# Patient Record
Sex: Female | Born: 1960 | Race: Black or African American | Hispanic: No | Marital: Married | Smoking: Never smoker
Health system: Southern US, Community
[De-identification: ages and names within clinical notes are randomized; demographics above are authoritative.]

## PROBLEM LIST (undated history)

## (undated) ENCOUNTER — Ambulatory Visit: Admission: EM | Payer: Self-pay | Source: Home / Self Care

## (undated) DIAGNOSIS — I10 Essential (primary) hypertension: Secondary | ICD-10-CM

## (undated) DIAGNOSIS — R202 Paresthesia of skin: Secondary | ICD-10-CM

## (undated) DIAGNOSIS — R200 Anesthesia of skin: Secondary | ICD-10-CM

## (undated) DIAGNOSIS — R7303 Prediabetes: Secondary | ICD-10-CM

## (undated) DIAGNOSIS — E785 Hyperlipidemia, unspecified: Secondary | ICD-10-CM

## (undated) DIAGNOSIS — G4730 Sleep apnea, unspecified: Secondary | ICD-10-CM

## (undated) DIAGNOSIS — M1990 Unspecified osteoarthritis, unspecified site: Secondary | ICD-10-CM

## (undated) DIAGNOSIS — K219 Gastro-esophageal reflux disease without esophagitis: Secondary | ICD-10-CM

## (undated) DIAGNOSIS — D259 Leiomyoma of uterus, unspecified: Secondary | ICD-10-CM

## (undated) DIAGNOSIS — D649 Anemia, unspecified: Secondary | ICD-10-CM

## (undated) DIAGNOSIS — Z90710 Acquired absence of both cervix and uterus: Secondary | ICD-10-CM

## (undated) DIAGNOSIS — R011 Cardiac murmur, unspecified: Secondary | ICD-10-CM

## (undated) HISTORY — PX: TUBAL LIGATION: SHX77

## (undated) HISTORY — PX: CHOLECYSTECTOMY: SHX55

## (undated) HISTORY — PX: BACK SURGERY: SHX140

## (undated) HISTORY — DX: Gastro-esophageal reflux disease without esophagitis: K21.9

## (undated) HISTORY — DX: Acquired absence of both cervix and uterus: Z90.710

## (undated) HISTORY — DX: Hyperlipidemia, unspecified: E78.5

## (undated) HISTORY — DX: Leiomyoma of uterus, unspecified: D25.9

## (undated) HISTORY — DX: Anemia, unspecified: D64.9

---

## 2001-06-02 ENCOUNTER — Other Ambulatory Visit: Admission: RE | Admit: 2001-06-02 | Discharge: 2001-06-02 | Payer: Self-pay | Admitting: Family Medicine

## 2001-06-10 HISTORY — PX: COLONOSCOPY: SHX174

## 2002-01-01 ENCOUNTER — Ambulatory Visit (HOSPITAL_COMMUNITY): Admission: RE | Admit: 2002-01-01 | Discharge: 2002-01-01 | Payer: Self-pay | Admitting: Internal Medicine

## 2002-06-18 ENCOUNTER — Ambulatory Visit (HOSPITAL_COMMUNITY): Admission: RE | Admit: 2002-06-18 | Discharge: 2002-06-18 | Payer: Self-pay | Admitting: Pediatrics

## 2002-06-18 ENCOUNTER — Encounter: Payer: Self-pay | Admitting: Family Medicine

## 2003-03-11 ENCOUNTER — Encounter: Payer: Self-pay | Admitting: Family Medicine

## 2003-03-11 ENCOUNTER — Ambulatory Visit (HOSPITAL_COMMUNITY): Admission: RE | Admit: 2003-03-11 | Discharge: 2003-03-11 | Payer: Self-pay | Admitting: Family Medicine

## 2003-03-18 ENCOUNTER — Encounter: Payer: Self-pay | Admitting: Family Medicine

## 2003-03-18 ENCOUNTER — Ambulatory Visit (HOSPITAL_COMMUNITY): Admission: RE | Admit: 2003-03-18 | Discharge: 2003-03-18 | Payer: Self-pay | Admitting: Family Medicine

## 2003-03-29 ENCOUNTER — Observation Stay (HOSPITAL_COMMUNITY): Admission: RE | Admit: 2003-03-29 | Discharge: 2003-03-30 | Payer: Self-pay | Admitting: General Surgery

## 2003-10-21 ENCOUNTER — Ambulatory Visit (HOSPITAL_COMMUNITY): Admission: RE | Admit: 2003-10-21 | Discharge: 2003-10-21 | Payer: Self-pay | Admitting: Family Medicine

## 2004-07-13 ENCOUNTER — Ambulatory Visit: Payer: Self-pay | Admitting: Family Medicine

## 2004-07-18 ENCOUNTER — Ambulatory Visit (HOSPITAL_COMMUNITY): Admission: RE | Admit: 2004-07-18 | Discharge: 2004-07-18 | Payer: Self-pay | Admitting: Family Medicine

## 2004-09-24 ENCOUNTER — Ambulatory Visit: Payer: Self-pay | Admitting: Family Medicine

## 2004-11-02 ENCOUNTER — Ambulatory Visit (HOSPITAL_COMMUNITY): Admission: RE | Admit: 2004-11-02 | Discharge: 2004-11-02 | Payer: Self-pay | Admitting: Family Medicine

## 2004-12-03 ENCOUNTER — Ambulatory Visit: Payer: Self-pay | Admitting: Family Medicine

## 2005-02-22 ENCOUNTER — Ambulatory Visit (HOSPITAL_COMMUNITY): Admission: RE | Admit: 2005-02-22 | Discharge: 2005-02-22 | Payer: Self-pay | Admitting: Preventative Medicine

## 2005-05-21 ENCOUNTER — Ambulatory Visit: Payer: Self-pay | Admitting: Family Medicine

## 2005-06-05 ENCOUNTER — Ambulatory Visit: Payer: Self-pay | Admitting: Family Medicine

## 2005-06-19 ENCOUNTER — Ambulatory Visit (HOSPITAL_COMMUNITY): Admission: RE | Admit: 2005-06-19 | Discharge: 2005-06-19 | Payer: Self-pay | Admitting: Family Medicine

## 2005-07-10 ENCOUNTER — Ambulatory Visit (HOSPITAL_COMMUNITY): Admission: RE | Admit: 2005-07-10 | Discharge: 2005-07-10 | Payer: Self-pay | Admitting: Family Medicine

## 2005-07-11 ENCOUNTER — Emergency Department (HOSPITAL_COMMUNITY): Admission: EM | Admit: 2005-07-11 | Discharge: 2005-07-11 | Payer: Self-pay | Admitting: Emergency Medicine

## 2005-08-15 ENCOUNTER — Ambulatory Visit: Payer: Self-pay | Admitting: Family Medicine

## 2005-08-21 ENCOUNTER — Ambulatory Visit (HOSPITAL_COMMUNITY): Admission: RE | Admit: 2005-08-21 | Discharge: 2005-08-21 | Payer: Self-pay | Admitting: Family Medicine

## 2005-11-21 ENCOUNTER — Ambulatory Visit: Payer: Self-pay | Admitting: Family Medicine

## 2006-03-14 ENCOUNTER — Ambulatory Visit (HOSPITAL_COMMUNITY): Admission: RE | Admit: 2006-03-14 | Discharge: 2006-03-14 | Payer: Self-pay | Admitting: Family Medicine

## 2006-04-03 ENCOUNTER — Ambulatory Visit: Payer: Self-pay | Admitting: Family Medicine

## 2006-04-16 ENCOUNTER — Ambulatory Visit: Payer: Self-pay | Admitting: Family Medicine

## 2006-08-28 ENCOUNTER — Ambulatory Visit: Payer: Self-pay | Admitting: Family Medicine

## 2006-08-28 ENCOUNTER — Other Ambulatory Visit: Admission: RE | Admit: 2006-08-28 | Discharge: 2006-08-28 | Payer: Self-pay | Admitting: Family Medicine

## 2006-08-28 ENCOUNTER — Encounter: Payer: Self-pay | Admitting: Family Medicine

## 2006-08-28 LAB — CONVERTED CEMR LAB: Pap Smear: NORMAL

## 2006-09-01 ENCOUNTER — Encounter: Payer: Self-pay | Admitting: Family Medicine

## 2006-09-01 LAB — CONVERTED CEMR LAB
Candida species: NEGATIVE
Trichomonal Vaginitis: NEGATIVE

## 2006-09-02 ENCOUNTER — Encounter: Payer: Self-pay | Admitting: Family Medicine

## 2006-09-02 LAB — CONVERTED CEMR LAB
Chlamydia, DNA Probe: NEGATIVE
GC Probe Amp, Genital: NEGATIVE

## 2006-09-08 ENCOUNTER — Encounter: Payer: Self-pay | Admitting: Family Medicine

## 2006-09-08 LAB — CONVERTED CEMR LAB
AST: 17 units/L (ref 0–37)
Albumin: 4 g/dL (ref 3.5–5.2)
Alkaline Phosphatase: 74 units/L (ref 39–117)
Bilirubin, Direct: 0.1 mg/dL (ref 0.0–0.3)
CO2: 22 meq/L (ref 19–32)
Calcium: 9.1 mg/dL (ref 8.4–10.5)
Chloride: 105 meq/L (ref 96–112)
Cholesterol: 172 mg/dL (ref 0–200)
Creatinine, Ser: 0.84 mg/dL (ref 0.40–1.20)
HDL: 42 mg/dL (ref >39)
Hemoglobin: 11.9 g/dL — ABNORMAL LOW (ref 12.0–15.0)
Lymphs Abs: 2 10*3/uL (ref 0.7–3.3)
MCHC: 33.1 g/dL (ref 30.0–36.0)
MCV: 90.5 fL (ref 78.0–100.0)
Neutro Abs: 3 10*3/uL (ref 1.7–7.7)
Platelets: 221 10*3/uL (ref 150–400)
Potassium: 4 meq/L (ref 3.5–5.3)
RBC: 3.98 M/uL (ref 3.87–5.11)
RDW: 13.5 % (ref 11.5–14.0)
Sodium: 136 meq/L (ref 135–145)
Total Bilirubin: 0.4 mg/dL (ref 0.3–1.2)
Total CHOL/HDL Ratio: 4.1
Total Protein: 7.5 g/dL (ref 6.0–8.3)
VLDL: 15 mg/dL (ref 0–40)
WBC: 5.8 10*3/uL (ref 4.0–10.5)

## 2006-09-09 ENCOUNTER — Encounter: Payer: Self-pay | Admitting: Family Medicine

## 2006-09-09 LAB — CONVERTED CEMR LAB: Free T4: 1.06 ng/dL (ref 0.89–1.80)

## 2006-10-07 ENCOUNTER — Ambulatory Visit: Payer: Self-pay | Admitting: Family Medicine

## 2006-10-08 ENCOUNTER — Encounter: Payer: Self-pay | Admitting: Family Medicine

## 2006-11-18 ENCOUNTER — Ambulatory Visit (HOSPITAL_COMMUNITY): Admission: RE | Admit: 2006-11-18 | Discharge: 2006-11-18 | Payer: Self-pay | Admitting: Obstetrics and Gynecology

## 2007-06-11 ENCOUNTER — Encounter: Payer: Self-pay | Admitting: Family Medicine

## 2007-11-07 ENCOUNTER — Emergency Department (HOSPITAL_COMMUNITY): Admission: EM | Admit: 2007-11-07 | Discharge: 2007-11-07 | Payer: Self-pay | Admitting: Emergency Medicine

## 2007-11-13 ENCOUNTER — Encounter: Payer: Self-pay | Admitting: Family Medicine

## 2007-11-13 DIAGNOSIS — J301 Allergic rhinitis due to pollen: Secondary | ICD-10-CM

## 2007-12-17 ENCOUNTER — Ambulatory Visit (HOSPITAL_COMMUNITY): Admission: RE | Admit: 2007-12-17 | Discharge: 2007-12-17 | Payer: Self-pay | Admitting: Family Medicine

## 2008-04-17 ENCOUNTER — Emergency Department (HOSPITAL_COMMUNITY): Admission: EM | Admit: 2008-04-17 | Discharge: 2008-04-17 | Payer: Self-pay | Admitting: Emergency Medicine

## 2008-11-14 ENCOUNTER — Emergency Department (HOSPITAL_COMMUNITY): Admission: EM | Admit: 2008-11-14 | Discharge: 2008-11-14 | Payer: Self-pay | Admitting: Emergency Medicine

## 2009-05-10 ENCOUNTER — Ambulatory Visit (HOSPITAL_COMMUNITY): Admission: RE | Admit: 2009-05-10 | Discharge: 2009-05-10 | Payer: Self-pay | Admitting: Family Medicine

## 2009-05-22 ENCOUNTER — Ambulatory Visit (HOSPITAL_COMMUNITY): Admission: RE | Admit: 2009-05-22 | Discharge: 2009-05-22 | Payer: Self-pay | Admitting: Family Medicine

## 2009-09-28 ENCOUNTER — Ambulatory Visit: Payer: Self-pay | Admitting: Family Medicine

## 2010-06-30 ENCOUNTER — Encounter: Payer: Self-pay | Admitting: Family Medicine

## 2010-07-01 ENCOUNTER — Encounter: Payer: Self-pay | Admitting: Family Medicine

## 2010-07-03 ENCOUNTER — Ambulatory Visit (HOSPITAL_COMMUNITY)
Admission: RE | Admit: 2010-07-03 | Discharge: 2010-07-03 | Payer: Self-pay | Source: Home / Self Care | Attending: Family Medicine | Admitting: Family Medicine

## 2010-07-10 NOTE — Letter (Signed)
Summary: LABS  LABS   Imported By: Lind Guest 01/31/2010 13:04:53  _____________________________________________________________________  External Attachment:    Type:   Image     Comment:   External Document

## 2010-07-10 NOTE — Letter (Signed)
Summary: HISTORY & PHYSICAL  HISTORY & PHYSICAL   Imported By: Lind Guest 01/31/2010 13:04:18  _____________________________________________________________________  External Attachment:    Type:   Image     Comment:   External Document

## 2010-07-10 NOTE — Letter (Signed)
Summary: X RAYS  X RAYS   Imported By: Lind Guest 01/31/2010 13:05:21  _____________________________________________________________________  External Attachment:    Type:   Image     Comment:   External Document

## 2010-07-10 NOTE — Assessment & Plan Note (Signed)
Summary: insect bite- room 1   Vital Signs:  Patient profile:   50 year old female Weight:      159.50 pounds O2 Sat:      100 % on Room air Pulse rate:   84 / minute Resp:     16 per minute BP sitting:   130 / 80  (left arm)  Vitals Entered By: Adella Hare LPN (September 28, 2009 4:19 PM) CC: insect bite Is Patient Diabetic? No Pain Assessment Patient in pain? no        CC:  insect bite.  History of Present Illness: Pt is here today with 2 concerns.  She noticed a rash on her Rt side 1-2 weeks ago.  She spoke to the pharmacist & got an over the counter cream which she has been applying.  She states the rash seems to be healing, but the area is very painful.  Hurts to lie on that side when sleeping, and very painful to touch the area.  Pt reports pain Rt hand/wrist/forearm x couple mos.  Notices with grabbing or picking up things.  No HS syptoms. No numbness / tingling.  No neck pain.  no injury.  Pain shoots up her forearm from the palm of her hand.   Current Medications (verified): 1)  None  Allergies (verified): No Known Drug Allergies  Past History:  Past medical history reviewed for relevance to current acute and chronic problems.  Past Medical History: Reviewed history from 11/13/2007 and no changes required. Current Problems:  ALLERGIC RHINITIS, SEASONAL (ICD-477.0)  Review of Systems General:  Denies chills and fever. MS:  Complains of muscle and muscle weakness; denies joint pain, joint redness, joint swelling, loss of strength, muscle aches, and cramps; DROPS THINGS WHEN SHE GETS THE PAIN. Derm:  Complains of rash; denies itching. Neuro:  Denies numbness and tingling.  Physical Exam  General:  Well-developed,well-nourished,in no acute distress; alert,appropriate and cooperative throughout examination Head:  Normocephalic and atraumatic without obvious abnormalities. No apparent alopecia or balding. Lungs:  Normal respiratory effort, chest expands  symmetrically. Lungs are clear to auscultation, no crackles or wheezes. Heart:  Normal rate and regular rhythm. S1 and S2 normal without gallop, murmur, click, rub or other extra sounds. Msk:  Rt Shoulder, Wrist, & Elbow normal ROM, no joint tenderness, no joint swelling, no joint warmth, no redness over joints, no joint deformities, no joint instability, no crepitation, and no muscle atrophy.   Pulses:  R radial normal.   Extremities:  No clubbing, cyanosis, edema, or deformity noted with normal full range of motion of all joints.  Grip strength strong & equal.  Unable to reproduce pain during visit. Neurologic:  alert & oriented X3, strength normal in all extremities, sensation intact to light touch, and DTRs symmetrical and normal.  Neg Tinnels & Phalens. Skin:  Rt thorax, mid axillary line, grouped area of scabbed  tiny clusered papules, no current erythema, but slight hyperpigmentation noted from recent inflammation.  Approx T 7 dermatome. Psych:  Cognition and judgment appear intact. Alert and cooperative with normal attention span and concentration. No apparent delusions, illusions, hallucinations   Impression & Recommendations:  Problem # 1:  POSTHERPETIC NEURALGIA (ICD-053.19) Assessment New Discussed that she had shingles, which is now almost resolved. Discussed pain after shingles can sometimes last weeks to months.  Problem # 2:  ARM PAIN, RIGHT (ICD-729.5) Assessment: New probable tendonitis.  Complete Medication List: 1)  Lidoderm 5 % Ptch (Lidocaine) .... Apply x 12 hrs to  affected area as discussed 2)  Ibuprofen 800 Mg Tabs (Ibuprofen) .... Take 1 three times a day for pain  Patient Instructions: 1)  Please schedule a follow-up appointment in 2 weeks if no improvement. 2)  I have prescribed Ibuprofen for you to take for pain & to help with the tendonitis in your Rt arm. 3)  I have also prescribed Lidoderm patches for you to use on the area of your shingles.  Apply the  patch for 12 hrs , then take off for 12 hrs. Prescriptions: IBUPROFEN 800 MG TABS (IBUPROFEN) take 1 three times a day for pain  #100 x 0   Entered and Authorized by:   Esperanza Sheets PA   Signed by:   Esperanza Sheets PA on 09/28/2009   Method used:   Electronically to        [facility] (retail)       [address]       [place], [place]  [address]       Ph: [phone] or [phone]       Fax: [phone]   RxID:   [id number] LIDODERM 5 % PTCH (LIDOCAINE) apply x 12 hrs to affected area as discussed  #30 x 0   Entered and Authorized by:   Esperanza Sheets PA   Signed by:   Esperanza Sheets PA on 09/28/2009   Method used:   Electronically to        [facility] (retail)       [address]       [place], [place]  [address]       Ph: [phone] or [phone]       Fax: [phone]   RxID:   [id number]

## 2010-07-10 NOTE — Letter (Signed)
Summary: DEMO  DEMO   Imported By: Lind Guest 01/31/2010 13:03:06  _____________________________________________________________________  External Attachment:    Type:   Image     Comment:   External Document

## 2010-07-10 NOTE — Letter (Signed)
Summary: OFFICE NOTES  OFFICE NOTES   Imported By: Lind Guest 01/31/2010 13:06:16  _____________________________________________________________________  External Attachment:    Type:   Image     Comment:   External Document

## 2010-07-10 NOTE — Letter (Signed)
Summary: Work Excuse  [facility]  [address]   [place], [place] [address]   Phone: [phone]  Fax: [phone]    Today's Date: September 28, 2009  Name of Patient: Jacqueline [name]  The above named patient had a medical visit today at:  4 pm.  Please take this into consideration when reviewing the time away from work/school.    Special Instructions:  [ * ] None  [  ] To be off the remainder of today, returning to the normal work / school schedule tomorrow.  [  ] To be off until the next scheduled appointment on ______________________.  [  ] Other ________________________________________________________________ ________________________________________________________________________   Sincerely yours,   Esperanza Sheets, PA

## 2010-07-10 NOTE — Letter (Signed)
Summary: CONSULTS  CONSULTS   Imported By: Lind Guest 01/31/2010 13:03:33  _____________________________________________________________________  External Attachment:    Type:   Image     Comment:   External Document

## 2010-09-17 LAB — DIFFERENTIAL
Basophils Absolute: 0 10*3/uL (ref 0.0–0.1)
Eosinophils Relative: 3 % (ref 0–5)
Lymphs Abs: 1.5 10*3/uL (ref 0.7–4.0)
Monocytes Relative: 9 % (ref 3–12)
Neutro Abs: 2.7 10*3/uL (ref 1.7–7.7)
Neutrophils Relative %: 57 % (ref 43–77)

## 2010-09-17 LAB — COMPREHENSIVE METABOLIC PANEL
ALT: 11 U/L (ref 0–35)
AST: 18 U/L (ref 0–37)
Albumin: 3.4 g/dL — ABNORMAL LOW (ref 3.5–5.2)
Alkaline Phosphatase: 68 U/L (ref 39–117)
Calcium: 8.8 mg/dL (ref 8.4–10.5)
Chloride: 108 mEq/L (ref 96–112)
Creatinine, Ser: 0.81 mg/dL (ref 0.4–1.2)
GFR calc Af Amer: >60 mL/min (ref >60)
Glucose, Bld: 92 mg/dL (ref 70–99)
Potassium: 4 mEq/L (ref 3.5–5.1)
Total Bilirubin: 0.4 mg/dL (ref 0.3–1.2)
Total Protein: 6.9 g/dL (ref 6.0–8.3)

## 2010-09-17 LAB — CBC
HCT: 34 % — ABNORMAL LOW (ref 36.0–46.0)
Hemoglobin: 11.9 g/dL — ABNORMAL LOW (ref 12.0–15.0)
Platelets: 172 10*3/uL (ref 150–400)
RDW: 14.1 % (ref 11.5–15.5)
WBC: 4.8 10*3/uL (ref 4.0–10.5)

## 2010-10-26 NOTE — Op Note (Signed)
   NAME:  Jacqueline [name], Jacqueline [name]                           ACCOUNT NO.:  [id number]   MEDICAL RECORD NO.:  [id number]                   PATIENT TYPE:  AMB   LOCATION:  DAY                                  FACILITY:  [facility]   PHYSICIAN:  Jerolyn Shin C. Katrinka Blazing, M.D.                DATE OF BIRTH:  24-May-1961   DATE OF PROCEDURE:  DATE OF DISCHARGE:                                 OPERATIVE REPORT   PREOPERATIVE DIAGNOSIS:  Chronic cholecystitis.   POSTOPERATIVE DIAGNOSIS:  Chronic cholecystitis.   PROCEDURE:  Laparoscopic cholecystectomy.   SURGEON:  Dirk Dress. Katrinka Blazing, M.D.   DESCRIPTION OF PROCEDURE:  Under general endotracheal anesthesia, the  patient's abdomen was prepped and draped in a sterile field.   A supraumbilical midline incision was made, and a Veress needle was inserted  without difficulty.  Intra-abdominal pressure was about 3.  The abdomen was  insufflated with 2 L of CO2.  Using a Visiport guide, a 10-mm port was  placed uneventfully.  The laparoscope was placed, and the gallbladder was  visualized.  Under videoscopic guidance, a 10-mm port and two 5-mm ports  were placed without difficulty.  The gallbladder was grasped and positioned.  The cystic duct was dissected, clipped with five clips, and divided close to  the gallbladder.  The cystic artery was dissected, clipped with four clips,  and divided.  Using electrocautery, the gallbladder was separated from the  infrahepatic bed without difficulty.  The gallbladder was grasped and  retrieved.  There was no bleeding, no drainage.  Irrigation fluid was  totally clear.  CO2 was allowed to escape from the abdomen, and the ports  were removed.  The fascia of the umbilical incision was closed with 0 Dexon.  The other incisions were closed with staples.   The patient tolerated the procedure well.  She was awakened from anesthesia  uneventfully, transferred to a bed, and taken to the postanesthetic care  unit.      ___________________________________________                                            Dirk Dress. Katrinka Blazing, M.D.   LCS/MEDQ  D:  03/29/2003  T:  03/29/2003  Job:  [id number]   cc:   Milus Mallick. Lodema Hong, M.D.  [address]  [place], [place] [address]  Fax: [phone]

## 2010-10-26 NOTE — H&P (Signed)
   NAME:  Jacqueline [name], Jacqueline [name]                           ACCOUNT NO.:  [id number]   MEDICAL RECORD NO.:  [id number]                   PATIENT TYPE:  AMB   LOCATION:  DAY                                  FACILITY:  [facility]   PHYSICIAN:  Jerolyn Shin C. Katrinka Blazing, M.D.                DATE OF BIRTH:  12/17/1960   DATE OF ADMISSION:  03/28/2003  DATE OF DISCHARGE:                                HISTORY & PHYSICAL   HISTORY OF PRESENT ILLNESS:  A 50 year old female with a 4 month history of  severe pain usually at night and after her evening meal. She has increased  indigestion, nausea. No vomiting. There is no radiation of the pain through  to her back. She was treated with Proton pump inhibitors and antiacid  without improvement. Gallbladder ultrasound was normal. A HIDA scan revealed  ejection fraction of 28%. The patient remained symptomatic and is scheduled  for laparoscopic cholecystectomy.   MEDICATIONS:  She is on no chronic medications.   PAST MEDICAL HISTORY:  None.   ALLERGIES:  No known drug allergies.   PAST SURGICAL HISTORY:  Tubal ligation.   SOCIAL HISTORY:  She is employed at [employer]. She is single with 3  children. She does not drink, smoke, or use drugs.   FAMILY HISTORY:  Positive for hypertension, diabetes mellitus and  atherosclerotic heart disease.   REVIEW OF SYSTEMS:  Negative except for occasional pain in the left leg with  no swelling.   PHYSICAL EXAMINATION:  VITAL SIGNS:  Blood pressure 130/80, pulse 76,  respiratory rate 16, weight 152 pounds.  HEENT:  Unremarkable.  NECK:  Supple. No jugular venous distention. No bruits.  CHEST:  Clear to auscultation.  HEART:  Regular rate and rhythm. Without murmur, rub, or gallop.  ABDOMEN:  Mild epigastric tenderness. No masses. Normal bowel sounds. No  organomegaly.  EXTREMITIES:  Normal joints. No clubbing, cyanosis, or edema.  NEUROLOGIC:  No focal, motor, sensory, or cerebellar deficits.    IMPRESSION:  Chronic  cholecystitis.   PLAN:  Laparoscopic cholecystectomy.     ___________________________________________                                         Dirk Dress Katrinka Blazing, M.D.   LCS/MEDQ  D:  03/29/2003  T:  03/29/2003  Job:  [id number]

## 2010-10-26 NOTE — Op Note (Signed)
NAME:  Jacqueline [name], Jacqueline [name]                           ACCOUNT NO.:  [id number]   MEDICAL RECORD NO.:  [id number]                   PATIENT TYPE:  AMB   LOCATION:  DAY                                  FACILITY:  [facility]   PHYSICIAN:  Gerrit Friends. Rourk, M.D.               DATE OF BIRTH:  September 26, 1960   DATE OF PROCEDURE:  DATE OF DISCHARGE:  01/01/2002                                 OPERATIVE REPORT   PROCEDURE:  Diagnostic colonoscopy.   ENDOSCOPIST:  Gerrit Friends. Rourk, M.D.   INDICATIONS:  The patient is a 50 year old lady, kindly referred out of the  courtesy of  Ellie Lunch, M.D. to further evaluate Hemoccult-positive  stool and recent hematochezia.  She tells me that around July 1, for a 24-  hour period, she developed abdominal cramping and gross blood per rectum.  She was found to be Hemoccult-positive by  Dr. Wende Crease.  H&H on December 08, 2001, were 11.5 and 32.8 mcg, MCV 92.5.   She tells me she is chronically constipated except for that 24-hour period  of diarrhea with blood.  Since this time, her bowel function has returned to  normal and she is chronically constipated, having one bowel movement on the  order of every three days.  She has not had any more blood per rectum.  There is no family history of IBB or colorectal cancer.  Colonoscopy is now  being done to further evaluate her further symptoms.  This approach has been  discussed with the patient and potential risks, benefits and alternatives  had been reviewed and questions answered.  I feel she is low risk for  conscious sedation.  Please see my handwritten H&P for more information.   DESCRIPTION OF PROCEDURE:  O2 saturation, blood pressure, pulse and  respirations were monitored throughout the entirety of the procedure.   CONSCIOUS SEDATION:  Versed 3 mg IV and Demerol 50 mg IV.   INSTRUMENT USED:  Olympus video chip adult colonoscope.   FINDINGS:  Digital rectal exam reveals some anal canal hemorrhoidal tags,  otherwise, negative.   ENDOSCOPIC ASSESSMENT:  The prep was excellent.   Rectum:  Examination of the rectal mucosa including a retroflexed view of  the anal verge revealed no abnormalities.   Colon:  The colonic mucosa was surveyed from the rectosigmoid junction  through the left, transverse and right colon to the area of the appendiceal  orifice, ileocecal valve and cecum.  These structures were well-seen and  photographed for the record.  The colonic mucosa appeared entirely normal  all the way through the cecum.  The terminal ileum was intubated at 20 cm;  this segment of GI tract also appeared normal.  From the level of the cecum  and ileocecal valve, the scope was slowly and cautiously withdrawn.  All  previously mentioned mucosal surfaces were again seen.  Again, the mucosa  appeared entirely  normal.   The patient tolerated the procedure well and was reactive in endoscopy.   IMPRESSION:  Anal canal hemorrhoids; otherwise, normal rectum, normal colon,  and normal terminal ileum.   Clinically, it seems like the patient had a self-limiting bout of colitis,  either infectious or less likely segmental or ischemic.  It is reassuring to  see that her lower gastrointestinal tract mucosa appears normal today and  her symptoms have resolved.  She does have some anorectal discomfort from  her hemorrhoids secondary to taking the prep yesterday.   RECOMMENDATIONS:  1. Five-day course of Anusol-HC suppositories, one per rectum at bedtime.  2. Crystallose laxative 20 g orally at bedtime as needed for constipation.  3. She is to follow up with Dr. Wende Crease as needed.  4. She should consider a standard colorectal cancer screening with a     colonoscopy beginning at age 70.  5. Of course, if she were to develop any new GI symptoms in the future, she     is urged to give me a call.                                               Gerrit Friends. Rourk, M.D.    RMR/MEDQ  D:  01/01/2002  T:   01/07/2002  Job:  [id number]   cc:   Milas Gain, M.D.  [facility]ational [facility]

## 2011-09-13 ENCOUNTER — Ambulatory Visit (INDEPENDENT_AMBULATORY_CARE_PROVIDER_SITE_OTHER): Payer: BC Managed Care – PPO | Admitting: Family Medicine

## 2011-09-13 ENCOUNTER — Encounter: Payer: Self-pay | Admitting: Family Medicine

## 2011-09-13 VITALS — BP 120/80 | HR 81 | Resp 16 | Ht 62.5 in | Wt 167.0 lb

## 2011-09-13 DIAGNOSIS — B353 Tinea pedis: Secondary | ICD-10-CM

## 2011-09-13 DIAGNOSIS — N760 Acute vaginitis: Secondary | ICD-10-CM

## 2011-09-13 DIAGNOSIS — J019 Acute sinusitis, unspecified: Secondary | ICD-10-CM

## 2011-09-13 DIAGNOSIS — E669 Obesity, unspecified: Secondary | ICD-10-CM

## 2011-09-13 MED ORDER — FLUCONAZOLE 150 MG PO TABS: 150.0000 mg | ORAL_TABLET | Freq: Once | ORAL | Status: AC

## 2011-09-13 MED ORDER — TERBINAFINE HCL 1 % EX CREA: TOPICAL_CREAM | Freq: Two times a day (BID) | CUTANEOUS | Status: DC

## 2011-09-13 MED ORDER — AMOXICILLIN-POT CLAVULANATE 875-125 MG PO TABS: 1.0000 | ORAL_TABLET | Freq: Two times a day (BID) | ORAL | Status: AC

## 2011-09-13 NOTE — Progress Notes (Signed)
  Subjective:    Patient ID: Jacqueline [name], female    DOB: January 23, 1961, 51 y.o.   MRN: [id number]  HPI Pt here to establish care previous PCP [facility] Medications and history reviewed  Vaginal discharge x 3 weeks, has used monistat, it is persistent, she has some itching in the creases of her groin Sinus drainage x 3 weeks- runny nose, pressure in face, has popping in left ear, +HA, blood tinged dischage, no fever, no Sore throat  Peeling on feet since she had a pedicure,also had a few blisters pop up on the sides  Has had 2 periods in one month a few times, known fibroid uterus, GYN exam UTD, with PAP Smear   Review of Systems   GEN- denies fatigue, fever, weight loss,weakness, recent illness HEENT- denies eye drainage, change in vision, nasal discharge, CVS- denies chest pain, palpitations RESP- denies SOB, cough, wheeze ABD- denies N/V, +constipation, abd pain GU- denies dysuria, hematuria, dribbling, incontinence MSK- denies joint pain, muscle aches, injury Neuro- denies headache, dizziness, syncope, seizure activity      Objective:   Physical Exam GEN- NAD, alert and oriented x3 HEENT- PERRL, EOMI, non injected sclera, pink conjunctiva, MMM, oropharynx clear, Left TM- small clear effusion, Right TM clear Neck- Supple, no thryomegaly CVS- RRR, no murmur RESP-CTAB EXT- No edema Pulses- Radial, DP- 2+ GU- normal external genitalia, vaginal mucosa pink and moist, cervix visualized no growth, no blood form os, thick white discharge, Skin- peeling skin on left foot on lateral edges and heel, mild thickend callus, mild yellow discoloration on feels, mild peeling on right heel      Assessment & Plan:

## 2011-09-13 NOTE — Patient Instructions (Addendum)
It was nice to meet you today appear For years she used the cream twice a day and put Aquaphor or Vaseline over it  Take antibiotics for your sinuses. Use Vaseline in the nose He can try Sudafed if he had a lot of pressure in her ear Take a yeast pill one today and then the other after you finish the antibiotics F/U once a year for physical otherwise

## 2011-09-15 DIAGNOSIS — J019 Acute sinusitis, unspecified: Secondary | ICD-10-CM | POA: Insufficient documentation

## 2011-09-15 DIAGNOSIS — N760 Acute vaginitis: Secondary | ICD-10-CM | POA: Insufficient documentation

## 2011-09-15 DIAGNOSIS — B353 Tinea pedis: Secondary | ICD-10-CM | POA: Insufficient documentation

## 2011-09-15 DIAGNOSIS — E669 Obesity, unspecified: Secondary | ICD-10-CM | POA: Insufficient documentation

## 2011-09-15 NOTE — Assessment & Plan Note (Addendum)
Antibiotics given, sudafed

## 2011-09-15 NOTE — Assessment & Plan Note (Signed)
Cultures taken will treat for yeast with diflucan

## 2011-09-15 NOTE — Assessment & Plan Note (Signed)
Will cover for tinea, does not appear to be bacterial superinfection, dry peeling feet- lamisil

## 2011-09-17 ENCOUNTER — Telehealth: Payer: Self-pay | Admitting: Family Medicine

## 2011-09-17 LAB — WET PREP BY MOLECULAR PROBE: Candida species: NEGATIVE

## 2011-09-17 MED ORDER — FLUCONAZOLE 150 MG PO TABS: 150.0000 mg | ORAL_TABLET | Freq: Once | ORAL | Status: AC

## 2011-09-17 MED ORDER — METRONIDAZOLE 500 MG PO TABS: 500.0000 mg | ORAL_TABLET | Freq: Two times a day (BID) | ORAL | Status: DC

## 2011-09-17 NOTE — Telephone Encounter (Signed)
Please let pt know, Wet prep for discharge showed BV, I have sent flagyl for this, she should take the one yeast pill-Diflucan after she completes the antibiotics. Avoid bubble baths and body wash in the genital area

## 2011-09-17 NOTE — Telephone Encounter (Signed)
Called pt. No option to leave message. Will try again later.

## 2011-09-24 NOTE — Telephone Encounter (Signed)
Multiple attempts to contact pt.  Number no longer her number.  Sent letter.

## 2011-10-07 ENCOUNTER — Telehealth: Payer: Self-pay | Admitting: Family Medicine

## 2011-10-07 MED ORDER — FLUCONAZOLE 150 MG PO TABS: 150.0000 mg | ORAL_TABLET | Freq: Once | ORAL | Status: AC

## 2011-10-07 NOTE — Telephone Encounter (Signed)
Refill sent on yeast infection medication

## 2011-11-19 ENCOUNTER — Encounter: Payer: BC Managed Care – PPO | Admitting: Family Medicine

## 2011-11-28 ENCOUNTER — Ambulatory Visit (INDEPENDENT_AMBULATORY_CARE_PROVIDER_SITE_OTHER): Payer: BC Managed Care – PPO | Admitting: Family Medicine

## 2011-11-28 ENCOUNTER — Other Ambulatory Visit (HOSPITAL_COMMUNITY)
Admission: RE | Admit: 2011-11-28 | Discharge: 2011-11-28 | Disposition: A | Discharge status: Home / Self Care | Payer: BC Managed Care – PPO | Source: Ambulatory Visit | Attending: Family Medicine | Admitting: Family Medicine

## 2011-11-28 ENCOUNTER — Encounter: Payer: Self-pay | Admitting: Family Medicine

## 2011-11-28 VITALS — BP 136/84 | HR 65 | Resp 18 | Ht 62.5 in | Wt 165.1 lb

## 2011-11-28 DIAGNOSIS — E669 Obesity, unspecified: Secondary | ICD-10-CM

## 2011-11-28 DIAGNOSIS — Z1329 Encounter for screening for other suspected endocrine disorder: Secondary | ICD-10-CM

## 2011-11-28 DIAGNOSIS — Z0000 Encounter for general adult medical examination without abnormal findings: Secondary | ICD-10-CM

## 2011-11-28 DIAGNOSIS — Z1211 Encounter for screening for malignant neoplasm of colon: Secondary | ICD-10-CM

## 2011-11-28 DIAGNOSIS — Z113 Encounter for screening for infections with a predominantly sexual mode of transmission: Secondary | ICD-10-CM

## 2011-11-28 DIAGNOSIS — Z130 Encounter for screening for diseases of the blood and blood-forming organs and certain disorders involving the immune mechanism: Secondary | ICD-10-CM

## 2011-11-28 DIAGNOSIS — Z1321 Encounter for screening for nutritional disorder: Secondary | ICD-10-CM

## 2011-11-28 DIAGNOSIS — E785 Hyperlipidemia, unspecified: Secondary | ICD-10-CM

## 2011-11-28 DIAGNOSIS — Z01419 Encounter for gynecological examination (general) (routine) without abnormal findings: Secondary | ICD-10-CM | POA: Insufficient documentation

## 2011-11-28 DIAGNOSIS — Z1239 Encounter for other screening for malignant neoplasm of breast: Secondary | ICD-10-CM

## 2011-11-28 NOTE — Progress Notes (Signed)
  Subjective:    Patient ID: Jacqueline [name], female    DOB: 1961/04/29, 51 y.o.   MRN: [id number]  HPI Pt here for CPE/GYN exam No concerns Would like STD check Labs due Needs Mammogram   Review of Systems   GEN- denies fatigue, fever, weight loss,weakness, recent illness HEENT- denies eye drainage, change in vision, nasal discharge, CVS- denies chest pain, palpitations RESP- denies SOB, cough, wheeze ABD- denies N/V, change in stools, abd pain GU- denies dysuria, hematuria, dribbling, incontinence MSK- denies joint pain, muscle aches, injury Neuro- denies headache, dizziness, syncope, seizure activity      Objective:   Physical Exam GEN- NAD, alert and oriented x3 HEENT- PERRL, EOMI, non injected sclera, pink conjunctiva, MMM, oropharynx clear Neck- Supple, no thryomegaly CVS- RRR, no murmur RESP-CTAB ABD-NABS,soft, NT,ND  Breast- normal symmetry, no nipple inversion,no nipple drainage, no nodules or lumps felt Nodes- no axillary nodes GU- normal external genitalia, vaginal mucosa pink and moist, cervix visualized no growth, no blood form os, no discharge, no CMT, no ovarian masses, uterus normal size EXT- No edema Pulses- Radial, DP- 2+ Psych-normal affect and mood  Skin- minimal dry peeling feet on heels  FOBT- negative, normal rectal tone     Assessment & Plan:    CPE- PAP smear done, send for Mammo, immunizations UTD (TDAP 2008)   Labs to be done   Hyperlipidemia- mild elevation last year, recheck

## 2011-11-28 NOTE — Patient Instructions (Signed)
I recommend calcium (1200mg ) and Vitamin D (800IU) daily Exercise 30 minutes 5 days a week Dental visit every 6 months Eye visit once a year Please get the labs done fasting we will call with results PAP smear results to be mailed Try Aquaphor Intensive care for skin F/U 1 year for physical exam

## 2011-11-28 NOTE — Addendum Note (Signed)
Addended by: Kandis Fantasia B on: 11/28/2011 04:58 PM   Modules accepted: Orders

## 2011-11-28 NOTE — Assessment & Plan Note (Signed)
Weight down a few pounds, she exercises with praise dance

## 2011-11-30 LAB — WET PREP BY MOLECULAR PROBE
Candida species: NEGATIVE
Gardnerella vaginalis: POSITIVE — AB

## 2011-11-30 LAB — GC/CHLAMYDIA PROBE AMP, GENITAL
Chlamydia, DNA Probe: NEGATIVE
GC Probe Amp, Genital: NEGATIVE

## 2011-12-02 MED ORDER — METRONIDAZOLE 500 MG PO TABS: 500.0000 mg | ORAL_TABLET | Freq: Two times a day (BID) | ORAL | Status: AC

## 2011-12-02 NOTE — Addendum Note (Signed)
Addended by: Milinda Antis F on: 12/02/2011 12:05 PM   Modules accepted: Orders

## 2011-12-16 LAB — CBC
Hemoglobin: 11 g/dL — ABNORMAL LOW (ref 12.0–15.0)
MCH: 29.2 pg (ref 26.0–34.0)
MCHC: 32.8 g/dL (ref 30.0–36.0)
RDW: 15.3 % (ref 11.5–15.5)

## 2011-12-17 ENCOUNTER — Ambulatory Visit (HOSPITAL_COMMUNITY)
Admission: RE | Admit: 2011-12-17 | Discharge: 2011-12-17 | Disposition: A | Discharge status: Home / Self Care | Payer: BC Managed Care – PPO | Source: Ambulatory Visit | Attending: Family Medicine | Admitting: Family Medicine

## 2011-12-17 DIAGNOSIS — Z1231 Encounter for screening mammogram for malignant neoplasm of breast: Secondary | ICD-10-CM | POA: Insufficient documentation

## 2011-12-17 DIAGNOSIS — Z1239 Encounter for other screening for malignant neoplasm of breast: Secondary | ICD-10-CM

## 2011-12-17 LAB — LIPID PANEL
LDL Cholesterol: 99 mg/dL (ref 0–99)
Total CHOL/HDL Ratio: 3.8 Ratio
VLDL: 19 mg/dL (ref 0–40)

## 2011-12-17 LAB — COMPREHENSIVE METABOLIC PANEL
ALT: 9 U/L (ref 0–35)
AST: 20 U/L (ref 0–37)
Albumin: 3.7 g/dL (ref 3.5–5.2)
Alkaline Phosphatase: 66 U/L (ref 39–117)
BUN: 9 mg/dL (ref 6–23)
Potassium: 4.6 mEq/L (ref 3.5–5.3)
Sodium: 138 mEq/L (ref 135–145)
Total Protein: 6.8 g/dL (ref 6.0–8.3)

## 2011-12-19 ENCOUNTER — Telehealth: Payer: Self-pay | Admitting: Family Medicine

## 2011-12-20 MED ORDER — FLUCONAZOLE 150 MG PO TABS: 150.0000 mg | ORAL_TABLET | Freq: Once | ORAL | Status: AC

## 2011-12-20 NOTE — Telephone Encounter (Signed)
Ok to send in diflucan

## 2011-12-20 NOTE — Telephone Encounter (Signed)
Patient aware.

## 2011-12-20 NOTE — Telephone Encounter (Signed)
Please give her lab results as well when you call back, diflucan sent

## 2011-12-21 LAB — VITAMIN D 1,25 DIHYDROXY: Vitamin D2 1, 25 (OH)2: <8 pg/mL

## 2011-12-30 ENCOUNTER — Encounter: Payer: Self-pay | Admitting: Family Medicine

## 2011-12-30 ENCOUNTER — Ambulatory Visit (INDEPENDENT_AMBULATORY_CARE_PROVIDER_SITE_OTHER): Payer: BC Managed Care – PPO | Admitting: Family Medicine

## 2011-12-30 VITALS — BP 104/78 | HR 74 | Resp 16 | Ht 62.5 in | Wt 164.0 lb

## 2011-12-30 DIAGNOSIS — I4902 Ventricular flutter: Secondary | ICD-10-CM

## 2011-12-30 DIAGNOSIS — I498 Other specified cardiac arrhythmias: Secondary | ICD-10-CM | POA: Insufficient documentation

## 2011-12-30 NOTE — Progress Notes (Signed)
  Subjective:    Patient ID: Jacqueline [name], female    DOB: 31-Mar-1961, 51 y.o.   MRN: [id number]  HPI Patient presents with heart fluttering for the past week. 3 days last week she had fluttering on and off. Last Friday she had fluttering/palpitations felt her heart racing throughout the day and the night. She denies any new medications or over-the-counter medications. She's not been drinking a lot of caffeine recently. She denies chest pain, shortness of breath but states she felt weak and fatigued when the episodes were occuring    Review of Systems - per above   GEN- denies fatigue, fever, weight loss,weakness, recent illness HEENT- denies eye drainage, change in vision, nasal discharge, CVS- denies chest pain,+ palpitations RESP- denies SOB, cough, wheeze ABD- denies N/V, change in stools, abd pain GU- denies dysuria, hematuria, dribbling, incontinence MSK- denies joint pain, muscle aches, injury Neuro- denies headache, dizziness, syncope, seizure activity      Objective:   Physical Exam GEN- NAD, alert and oriented x3 HEENT-  EOMI, non injected sclera, pink conjunctiva, MMM, oropharynx clear Neck- Supple, no thryomegaly CVS- RRR, no murmur RESP-CTAB EXT- No edema Pulses- Radial, DP- 2+  EKG- NSR, no ST changes        Assessment & Plan:

## 2011-12-30 NOTE — Patient Instructions (Signed)
Avoid Caffienated  beverages Drink plenty of water You will be referred to the heart doctor  F/U as needed

## 2011-12-30 NOTE — Assessment & Plan Note (Signed)
Patient presents with new onset of fluttering of heart. EKG reassuring today. Exam benign. She had recent blood work which was unremarkable with exception of mild anemia. I will have her see cardiology for possible event monitor. He'll not start any medications today. Avoid caffeinated beverages increase water intake

## 2012-01-09 ENCOUNTER — Encounter: Payer: Self-pay | Admitting: Family Medicine

## 2012-01-10 ENCOUNTER — Encounter: Payer: Self-pay | Admitting: Cardiovascular Disease

## 2012-01-10 ENCOUNTER — Ambulatory Visit: Payer: BC Managed Care – PPO | Admitting: Cardiovascular Disease

## 2012-01-10 ENCOUNTER — Ambulatory Visit (INDEPENDENT_AMBULATORY_CARE_PROVIDER_SITE_OTHER): Payer: BC Managed Care – PPO | Admitting: Cardiovascular Disease

## 2012-01-10 VITALS — BP 112/70 | HR 72 | Ht 64.0 in | Wt 168.0 lb

## 2012-01-10 DIAGNOSIS — R002 Palpitations: Secondary | ICD-10-CM

## 2012-01-10 DIAGNOSIS — I498 Other specified cardiac arrhythmias: Secondary | ICD-10-CM

## 2012-01-10 DIAGNOSIS — E785 Hyperlipidemia, unspecified: Secondary | ICD-10-CM

## 2012-01-10 DIAGNOSIS — I4902 Ventricular flutter: Secondary | ICD-10-CM

## 2012-01-10 NOTE — Patient Instructions (Addendum)
Your physician has requested that you have a stress echocardiogram at [facility]. For further information please visit [email or web address] Please follow instruction sheet as given.  Your physician recommends that you schedule a follow-up appointment as needed.

## 2012-01-10 NOTE — Assessment & Plan Note (Signed)
Cholesterol is at goal.  Continue current dose of statin and diet Rx.  No myalgias or side effects.  F/U  LFT's in 6 months. Lab Results  Component Value Date   LDLCALC 99 12/16/2011

## 2012-01-10 NOTE — Assessment & Plan Note (Signed)
Palpitations seem benign  Normal baseline ECG and exam.  Given presyncope and exertional dyspnea favor stress echo to R/O structural heart disease.

## 2012-01-10 NOTE — Progress Notes (Signed)
Patient ID: Jacqueline [name], female   DOB: 12-08-1960, 51 y.o.   MRN: [id number] 51 yo referred by Dr Jeanice Lim for palpitations and presyncope.  Two weeks ago after moving lawn in heat had day long palpitations and light headedness. No SSCP.  Mild exertional dyspnea.  Felt skips and rapid palpitations on and off for about 3 days.  None the last week.  No previous cardiac issues.  Baseline ECG is normal.  Has two siblings with no family history of cardiac issues.  She works at an [employer] and does all ADL's with just mild fatigue and dyspnea  ROS: Denies fever, malais, weight loss, blurry vision, decreased visual acuity, cough, sputum, SOB, hemoptysis, pleuritic pain, palpitaitons, heartburn, abdominal pain, melena, lower extremity edema, claudication, or rash.  All other systems reviewed and negative   General: Affect appropriate Healthy:  appears stated age HEENT: normal Neck supple with no adenopathy JVP normal no bruits no thyromegaly Lungs clear with no wheezing and good diaphragmatic motion Heart:  S1/S2 no murmur,rub, gallop or click PMI normal Abdomen: benighn, BS positve, no tenderness, no AAA no bruit.  No HSM or HJR Distal pulses intact with no bruits No edema Neuro non-focal Skin warm and dry No muscular weakness  Medications No current outpatient prescriptions on file.    Allergies Review of patient's allergies indicates no known allergies.  Family History: Family History  Problem Relation Age of Onset  . Hypertension Maternal Aunt   . Cancer Maternal Aunt     Breast  . Diabetes Paternal Uncle     Social History: History   Social History  . Marital Status: Single    Spouse Name: N/A    Number of Children: N/A  . Years of Education: N/A   Occupational History  . Not on file.   Social History Main Topics  . Smoking status: Never Smoker   . Smokeless tobacco: Not on file  . Alcohol Use: No  . Drug Use: No  . Sexually Active: Not on file   Other  Topics Concern  . Not on file   Social History Narrative  . No narrative on file    Electrocardiogram:  NSR rate 62 normal ECG  Assessment and Plan

## 2012-01-24 ENCOUNTER — Ambulatory Visit (HOSPITAL_COMMUNITY)
Admission: RE | Admit: 2012-01-24 | Discharge: 2012-01-24 | Disposition: A | Discharge status: Home / Self Care | Payer: BC Managed Care – PPO | Source: Ambulatory Visit | Attending: Cardiology | Admitting: Cardiology

## 2012-01-24 ENCOUNTER — Encounter (HOSPITAL_COMMUNITY): Payer: Self-pay | Admitting: Cardiology

## 2012-01-24 ENCOUNTER — Encounter: Payer: Self-pay | Admitting: *Deleted

## 2012-01-24 ENCOUNTER — Ambulatory Visit (HOSPITAL_COMMUNITY)
Admission: RE | Admit: 2012-01-24 | Discharge: 2012-01-24 | Disposition: A | Discharge status: Home / Self Care | Payer: BC Managed Care – PPO | Source: Ambulatory Visit | Attending: Cardiovascular Disease | Admitting: Cardiovascular Disease

## 2012-01-24 ENCOUNTER — Other Ambulatory Visit (HOSPITAL_COMMUNITY): Payer: BC Managed Care – PPO

## 2012-01-24 DIAGNOSIS — I498 Other specified cardiac arrhythmias: Secondary | ICD-10-CM

## 2012-01-24 DIAGNOSIS — R0609 Other forms of dyspnea: Secondary | ICD-10-CM | POA: Insufficient documentation

## 2012-01-24 DIAGNOSIS — R0602 Shortness of breath: Secondary | ICD-10-CM

## 2012-01-24 DIAGNOSIS — R002 Palpitations: Secondary | ICD-10-CM | POA: Insufficient documentation

## 2012-01-24 DIAGNOSIS — R55 Syncope and collapse: Secondary | ICD-10-CM | POA: Insufficient documentation

## 2012-01-24 DIAGNOSIS — R0989 Other specified symptoms and signs involving the circulatory and respiratory systems: Secondary | ICD-10-CM | POA: Insufficient documentation

## 2012-01-24 NOTE — Progress Notes (Signed)
Stress Lab Nurses Notes - [facility] Hawking  ZOXW [place] Footman 01/24/2012 Reason for doing test: Arrhythmia/Palpitations Type of test: Stress Echo Nurse performing test: Parke Poisson, RN Nuclear Medicine Tech: Not Applicable Echo Tech: Karrie Doffing MD performing test: R. Dietrich Pates Family MD: [facility] explained and consent signed: yes IV started: No IV started Symptoms: SOB Treatment/Intervention: None Reason test stopped: fatigue After recovery IV was: None Patient to return to Nuc. Med at : NA Patient discharged: Home Patient's Condition upon discharge was: stable Comments: During test peak BP 198/90 & HR 150.  Recovery BP 128/66 & HR 63.  Symptoms resolved in recovery. Erskine Speed T

## 2012-01-24 NOTE — Progress Notes (Signed)
*  PRELIMINARY RESULTS* Echocardiogram Echocardiogram Stress Test has been performed.  Jacqueline [name] 01/24/2012, 10:55 AM

## 2012-01-31 ENCOUNTER — Ambulatory Visit (INDEPENDENT_AMBULATORY_CARE_PROVIDER_SITE_OTHER): Payer: BC Managed Care – PPO | Admitting: Family Medicine

## 2012-01-31 ENCOUNTER — Encounter: Payer: Self-pay | Admitting: Family Medicine

## 2012-01-31 ENCOUNTER — Other Ambulatory Visit (HOSPITAL_COMMUNITY)
Admission: RE | Admit: 2012-01-31 | Discharge: 2012-01-31 | Disposition: A | Discharge status: Home / Self Care | Payer: BC Managed Care – PPO | Source: Ambulatory Visit | Attending: Family Medicine | Admitting: Family Medicine

## 2012-01-31 VITALS — BP 120/70 | HR 86 | Resp 16 | Ht 62.5 in | Wt 166.0 lb

## 2012-01-31 DIAGNOSIS — J040 Acute laryngitis: Secondary | ICD-10-CM

## 2012-01-31 DIAGNOSIS — N760 Acute vaginitis: Secondary | ICD-10-CM

## 2012-01-31 DIAGNOSIS — Z113 Encounter for screening for infections with a predominantly sexual mode of transmission: Secondary | ICD-10-CM | POA: Insufficient documentation

## 2012-01-31 NOTE — Patient Instructions (Addendum)
We will call with results from your cultures Try salt water gargles, try warm tea, cough lozenges Call if this does not improve  Laryngitis Laryngitis is redness, soreness, and puffiness (inflammation) of the vocal cords. It causes hoarseness, cough, loss of voice, sore throat, and dry throat. It may be caused by:  Infection.   Too much smoking.   Too much talking or yelling.   Breathing in of toxic fumes.   Allergies.   A backup of acid from your stomach.   HOME CARE  Drink enough fluids to keep your pee (urine) clear or pale yellow.   Rest until you no longer have problems or as told by your doctor.   Breathe in moist air.   Take all medicine as told by your doctor.   Do not smoke.   Talk as little as possible (this includes whispering).   Write on paper instead of talking until your voice is back to normal.   Follow up with your doctor if you have not improved after 10 days.  GET HELP IF:    You have trouble breathing.   You cough up blood.   You have a fever that will not go away.   You have increasing pain.   You have trouble swallowing.  MAKE SURE YOU:  Understand these instructions.   Will watch your condition.   Will get help right away if you are not doing well or get worse.  Document Released: 05/16/2011 Document Reviewed: 05/14/2011 [facility] Patient Information 2012 [place], [place].

## 2012-02-02 ENCOUNTER — Encounter: Payer: Self-pay | Admitting: Family Medicine

## 2012-02-02 DIAGNOSIS — N760 Acute vaginitis: Secondary | ICD-10-CM | POA: Insufficient documentation

## 2012-02-02 DIAGNOSIS — J040 Acute laryngitis: Secondary | ICD-10-CM | POA: Insufficient documentation

## 2012-02-02 NOTE — Progress Notes (Signed)
  Subjective:    Patient ID: Jacqueline [name], female    DOB: [birth year], 51 y.o.   MRN: [id number]  HPI Pt here with hoarse voice x 2 weeks, no URI symptoms, no sore throat, history of vocal cord nodule, no heartburn symptoms Vaginal discharge, has new partner has had mild discharge, no itching, no vaginal bleeding, no abd pain, would like cultures   Review of Systems   GEN- denies fatigue, fever, weight loss,weakness, recent illness HEENT- denies eye drainage, change in vision, nasal discharge, CVS- denies chest pain, palpitations RESP- denies SOB, cough, wheeze ABD- denies N/V, change in stools, abd pain GU- denies dysuria, hematuria, dribbling, incontinence Neuro- denies headache, dizziness, syncope, seizure activity      Objective:   Physical Exam GEN-NAD, alert and oriented x 3  HEENT- PERRL, EOMI, non injected sclera, pink conjunctiva, MMM, oropharynx clear, hoarse voice, TM clear bilat, nares clear Neck- Supple, no LAD CVS- RRR, no murmur RESP-CTAB GU- normal external genitalia, vaginal mucosa pink and moist, cervix visualized no growth, no blood form os, + thin discharge, no CMT, no ovarian masses, uterus normal size       Assessment & Plan:

## 2012-02-02 NOTE — Assessment & Plan Note (Signed)
Cultures taken, await results, pt not actually symptomatic at this time

## 2012-02-02 NOTE — Assessment & Plan Note (Signed)
Supportive care for another week, warm tea, fluids, no signs of infection, she will call for any changes if not improved will refer to ENT, pt agrees to plan

## 2012-02-04 MED ORDER — METRONIDAZOLE 500 MG PO TABS: 500.0000 mg | ORAL_TABLET | Freq: Two times a day (BID) | ORAL | Status: AC

## 2012-02-04 NOTE — Addendum Note (Signed)
Addended by: Milinda Antis F on: 02/04/2012 12:29 PM   Modules accepted: Orders

## 2012-04-27 ENCOUNTER — Telehealth: Payer: Self-pay | Admitting: Family Medicine

## 2012-04-27 NOTE — Telephone Encounter (Signed)
Noted and printed.  

## 2012-06-15 ENCOUNTER — Ambulatory Visit (INDEPENDENT_AMBULATORY_CARE_PROVIDER_SITE_OTHER): Payer: BC Managed Care – PPO | Admitting: Family Medicine

## 2012-06-15 ENCOUNTER — Encounter: Payer: Self-pay | Admitting: Family Medicine

## 2012-06-15 VITALS — BP 122/74 | HR 68 | Temp 97.9°F | Resp 18 | Ht 62.5 in | Wt 169.0 lb

## 2012-06-15 DIAGNOSIS — J209 Acute bronchitis, unspecified: Secondary | ICD-10-CM

## 2012-06-15 DIAGNOSIS — J019 Acute sinusitis, unspecified: Secondary | ICD-10-CM

## 2012-06-15 MED ORDER — BENZONATATE 100 MG PO CAPS: 100.0000 mg | ORAL_CAPSULE | Freq: Three times a day (TID) | ORAL | Status: DC | PRN

## 2012-06-15 MED ORDER — METHYLPREDNISOLONE ACETATE 80 MG/ML IJ SUSP
80.0000 mg | Freq: Once | INTRAMUSCULAR | Status: AC
  Administered 2012-06-15: 80 mg via INTRAMUSCULAR

## 2012-06-15 MED ORDER — PREDNISONE 5 MG PO TABS: 5.0000 mg | ORAL_TABLET | Freq: Two times a day (BID) | ORAL | Status: AC

## 2012-06-15 MED ORDER — CEFTRIAXONE SODIUM 1 G IJ SOLR
500.0000 mg | Freq: Once | INTRAMUSCULAR | Status: AC
  Administered 2012-06-15: 500 mg via INTRAMUSCULAR

## 2012-06-15 MED ORDER — FLUCONAZOLE 150 MG PO TABS: ORAL_TABLET | ORAL | Status: DC

## 2012-06-15 MED ORDER — ALBUTEROL SULFATE (5 MG/ML) 0.5% IN NEBU
2.5000 mg | INHALATION_SOLUTION | Freq: Once | RESPIRATORY_TRACT | Status: AC
  Administered 2012-06-15: 2.5 mg via RESPIRATORY_TRACT

## 2012-06-15 MED ORDER — PENICILLIN V POTASSIUM 500 MG PO TABS: 500.0000 mg | ORAL_TABLET | Freq: Three times a day (TID) | ORAL | Status: DC

## 2012-06-15 MED ORDER — IPRATROPIUM BROMIDE 0.02 % IN SOLN
0.5000 mg | Freq: Once | RESPIRATORY_TRACT | Status: AC
  Administered 2012-06-15: 0.5 mg via RESPIRATORY_TRACT

## 2012-06-15 NOTE — Patient Instructions (Addendum)
F/u with Dr Jeanice Lim as before.Call to be seen sooner if needed  You are being treated for acute sinusitis and acute bronchitis. Neb treatment in office, depo medrol 80 mg IM and Rocephin 500mg  IM. Prescription sent  for prednisone dose pack, penicillin, decongestants and med for vaginal yeast infection   Work excuse to return 06/17/2012.  Cover your cough , cough into your sleeve to reduce the risk of spreading infection and practice hand washing regularly

## 2012-06-15 NOTE — Progress Notes (Signed)
  Subjective:    Patient ID: Jacqueline [name], female    DOB: [birth year], 52 y.o.   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 Denies sore throat or ear pain    Review of Systems See HPI Denies chest pains, palpitations and leg swelling Denies abdominal pain, nausea, vomiting,diarrhea or constipation.   Denies dysuria, frequency, hesitancy or incontinence. Denies joint pain, swelling and limitation in mobility. Denies headaches, seizures, numbness, or tingling. Denies depression, anxiety or insomnia. Denies skin break down or rash.        Objective:   Physical Exam Patient alert and oriented and in no cardiopulmonary distress.  HEENT: No facial asymmetry, EOMI, maxillary  sinus tenderness,  oropharynx pink and moist.  Neck supple anterior cervical  Adenopathy.TM clear bilaterally  Chest: adequate air entry, scattered crackles, no wheezes CVS: S1, S2 no murmurs, no S3.  ABD: Soft non tender. Bowel sounds normal.  Ext: No edema  MS: Adequate ROM spine, shoulders, hips and knees.  Skin: Intact, no ulcerations or rash noted.  Psych: Good eye contact, normal affect. not anxious or depressed appearing.  CNS: CN 2-12 intact, power,  normal throughout.        Assessment & Plan:

## 2012-06-20 NOTE — Assessment & Plan Note (Signed)
Rocephin in office , followed by decongestant , steroids and antibiotics

## 2012-06-20 NOTE — Assessment & Plan Note (Signed)
antibiotic prescribed and administered

## 2012-07-24 ENCOUNTER — Telehealth: Payer: Self-pay | Admitting: Family Medicine

## 2012-07-27 ENCOUNTER — Other Ambulatory Visit (HOSPITAL_COMMUNITY)
Admission: RE | Admit: 2012-07-27 | Discharge: 2012-07-27 | Disposition: A | Discharge status: Home / Self Care | Payer: BC Managed Care – PPO | Source: Ambulatory Visit | Attending: Family Medicine | Admitting: Family Medicine

## 2012-07-27 ENCOUNTER — Encounter: Payer: Self-pay | Admitting: Family Medicine

## 2012-07-27 ENCOUNTER — Ambulatory Visit (INDEPENDENT_AMBULATORY_CARE_PROVIDER_SITE_OTHER): Payer: BC Managed Care – PPO | Admitting: Family Medicine

## 2012-07-27 VITALS — BP 122/74 | HR 68 | Resp 16 | Ht 62.5 in | Wt 164.8 lb

## 2012-07-27 DIAGNOSIS — S335XXA Sprain of ligaments of lumbar spine, initial encounter: Secondary | ICD-10-CM

## 2012-07-27 DIAGNOSIS — N939 Abnormal uterine and vaginal bleeding, unspecified: Secondary | ICD-10-CM

## 2012-07-27 DIAGNOSIS — S39012A Strain of muscle, fascia and tendon of lower back, initial encounter: Secondary | ICD-10-CM | POA: Insufficient documentation

## 2012-07-27 DIAGNOSIS — N898 Other specified noninflammatory disorders of vagina: Secondary | ICD-10-CM

## 2012-07-27 DIAGNOSIS — N920 Excessive and frequent menstruation with regular cycle: Secondary | ICD-10-CM

## 2012-07-27 DIAGNOSIS — N760 Acute vaginitis: Secondary | ICD-10-CM

## 2012-07-27 DIAGNOSIS — Z113 Encounter for screening for infections with a predominantly sexual mode of transmission: Secondary | ICD-10-CM | POA: Insufficient documentation

## 2012-07-27 DIAGNOSIS — J019 Acute sinusitis, unspecified: Secondary | ICD-10-CM

## 2012-07-27 LAB — POCT URINE PREGNANCY: Preg Test, Ur: NEGATIVE

## 2012-07-27 LAB — POCT URINALYSIS DIPSTICK
Glucose, UA: NEGATIVE
Ketones, UA: NEGATIVE
Leukocytes, UA: NEGATIVE
Protein, UA: NEGATIVE

## 2012-07-27 MED ORDER — METRONIDAZOLE 500 MG PO TABS: 500.0000 mg | ORAL_TABLET | Freq: Two times a day (BID) | ORAL | Status: DC

## 2012-07-27 MED ORDER — FLUTICASONE PROPIONATE 50 MCG/ACT NA SUSP: 2.0000 | Freq: Every day | NASAL | Status: DC

## 2012-07-27 NOTE — Telephone Encounter (Signed)
Pt has appt

## 2012-07-27 NOTE — Assessment & Plan Note (Signed)
OTC NSAIDS

## 2012-07-27 NOTE — Progress Notes (Signed)
  Subjective:    Patient ID: Jacqueline [name], female    DOB: 02-Sep-1960, 52 y.o.   MRN: [id number]  HPI  Patient presents with vaginal discharge for the past 4 days. She states it started after having sexual intercourse. She's had back to a vaginosis before and these are her same symptoms. She has a mild fishy odor. She's also had some vaginal spotting the past 3 days. Her last menstrual period was February 2 she's had close short cycles the past couple months. She continues to have problems with her sinuses has discharge from left nares mostly. Back pain on and off for the past 3 weeks she remembers having to work by herself on the job and she was lifting a lot this is when the pain started. Denies any change of bowel bladder Denies any radiation of symptoms  Review of Systems  GEN- denies fatigue, fever, weight loss,weakness, recent illness HEENT- denies eye drainage, change in vision,+ nasal discharge, CVS- denies chest pain, palpitations RESP- denies SOB, cough, wheeze ABD- denies N/V, change in stools, abd pain GU- denies dysuria, hematuria, dribbling, incontinence MSK- denies joint pain,+ muscle aches, injury Neuro- denies headache, dizziness, syncope, seizure activity      Objective:   Physical Exam  GEN- NAD, alert and oriented x 3 HEENT-PERRL, EOMI, non icteric, MMM, clear rhinorrhea, TM clear bilat Neck- supple, no LAD CVS-RRR,no murmur RESP-CTAB ABD-NABS,soft,NT,ND, No CVA tenderness Back- neg SLR, spine NT, normal ROM GU- normal external genitalia, vaginal mucosa pink and moist, cervix visualized no growth, + blood form os, + thin clear discharge, +mild odor,  no CMT, no ovarian masses, uterus normal size       Assessment & Plan:

## 2012-07-27 NOTE — Assessment & Plan Note (Signed)
Recent antibiotics, add flonase

## 2012-07-27 NOTE — Assessment & Plan Note (Signed)
Recent menses, now with spotting, this may be due to active vaginitis- U preg neg Other differential, perimenopausal changes  If this persist get ultrasound

## 2012-07-27 NOTE — Assessment & Plan Note (Signed)
Recurrent vaginal symptoms, typically associated with intercourse for her, will start on flagyl, await cultures UA neg

## 2012-07-27 NOTE — Patient Instructions (Signed)
Start flagyl You can use the ibuprofen for back pain Plenty of water Call if the spotting does not stop  Flonase for sinuses F/U as needed

## 2012-09-15 ENCOUNTER — Ambulatory Visit (INDEPENDENT_AMBULATORY_CARE_PROVIDER_SITE_OTHER): Payer: BC Managed Care – PPO | Admitting: Family Medicine

## 2012-09-15 ENCOUNTER — Encounter: Payer: Self-pay | Admitting: Family Medicine

## 2012-09-15 ENCOUNTER — Other Ambulatory Visit (HOSPITAL_COMMUNITY)
Admission: RE | Admit: 2012-09-15 | Discharge: 2012-09-15 | Disposition: A | Discharge status: Home / Self Care | Payer: BC Managed Care – PPO | Source: Ambulatory Visit | Attending: Family Medicine | Admitting: Family Medicine

## 2012-09-15 ENCOUNTER — Telehealth: Payer: Self-pay | Admitting: Family Medicine

## 2012-09-15 VITALS — BP 124/72 | HR 80 | Resp 18 | Ht 62.5 in | Wt 161.0 lb

## 2012-09-15 DIAGNOSIS — T148 Other injury of unspecified body region: Secondary | ICD-10-CM

## 2012-09-15 DIAGNOSIS — R102 Pelvic and perineal pain: Secondary | ICD-10-CM

## 2012-09-15 DIAGNOSIS — Z113 Encounter for screening for infections with a predominantly sexual mode of transmission: Secondary | ICD-10-CM | POA: Insufficient documentation

## 2012-09-15 DIAGNOSIS — K5900 Constipation, unspecified: Secondary | ICD-10-CM | POA: Insufficient documentation

## 2012-09-15 DIAGNOSIS — N760 Acute vaginitis: Secondary | ICD-10-CM | POA: Insufficient documentation

## 2012-09-15 DIAGNOSIS — W57XXXA Bitten or stung by nonvenomous insect and other nonvenomous arthropods, initial encounter: Secondary | ICD-10-CM

## 2012-09-15 DIAGNOSIS — N949 Unspecified condition associated with female genital organs and menstrual cycle: Secondary | ICD-10-CM

## 2012-09-15 DIAGNOSIS — R109 Unspecified abdominal pain: Secondary | ICD-10-CM

## 2012-09-15 LAB — POCT URINALYSIS DIPSTICK
Bilirubin, UA: NEGATIVE
Leukocytes, UA: NEGATIVE
Nitrite, UA: NEGATIVE
Protein, UA: NEGATIVE
pH, UA: 6

## 2012-09-15 NOTE — Assessment & Plan Note (Signed)
Cultures, ultrasound

## 2012-09-15 NOTE — Assessment & Plan Note (Addendum)
Pt has some right lower pain, no acute abdomen, does not appear to be an appendicitis but possible, other differentials constipation vs pelvic origin. She has had been treated for BV multiple times and has had vaginal spotting in past, will start with pelvic ultrasound and cultures Instructions on constipation If pain worsens or changes will get CT abd/pelvis UA neg

## 2012-09-15 NOTE — Assessment & Plan Note (Signed)
See instructions for bowels

## 2012-09-15 NOTE — Patient Instructions (Addendum)
Use hydrocortisone twice a day  Increase water and fiber, for your bowels You can try Miralax over the counter to help them move better Ultrasound to be done F/U as needed

## 2012-09-15 NOTE — Progress Notes (Signed)
  Subjective:    Patient ID: Jacqueline [name], female    DOB: 1961/02/12, 52 y.o.   MRN: [id number]  HPI  Patient here with right-sided abdominal pain for the past 3-4 days she has episodes of sharp pain on and off denies any nausea vomiting or fever. She has been constipated some on and off for the past couple weeks. Her last menstrual cycle was 2 and half weeks ago. She denies any vaginal discharge states that she did have discomfort with sex in the same spot on abdomen.  She also has to date she is spots on her leg and her arm that popped up about a week ago she denies any drainage from the least and she's not sure she was bitten by something she does remember pulling something black off of her leg  Review of Systems  GEN- denies fatigue, fever, weight loss,weakness, recent illness HEENT- denies eye drainage, change in vision, nasal discharge, CVS- denies chest pain, palpitations RESP- denies SOB, cough, wheeze ABD- denies N/V, +change in stools, abd pain GU- denies dysuria, hematuria, dribbling, incontinence Neuro- denies headache, dizziness, syncope, seizure activity      Objective:   Physical Exam GEN- NAD, alert and oriented x3 CVS- RRR, no murmur RESP-CTAB ABD-NABS,soft,MILD ttp Right lower quadrant,no rebound, no guarding GU- normal external genitalia, TTP over Right ovarian region, no mass felt, no bleeding cervix, white discharge noted, normal vaginal mucosa,no mass over left ovary EXT- No edema Pulses- Radial 2+ Skin- 1 papular lesion Right calf, no erythema, 1 small mcaulpapular lesions Left forearm, no erythema, no drainage        Assessment & Plan:

## 2012-09-15 NOTE — Assessment & Plan Note (Signed)
Treat with hydrocortisone

## 2012-09-16 NOTE — Telephone Encounter (Signed)
Patient is aware 

## 2012-09-17 ENCOUNTER — Other Ambulatory Visit (HOSPITAL_COMMUNITY): Payer: BC Managed Care – PPO

## 2012-09-17 ENCOUNTER — Ambulatory Visit (HOSPITAL_COMMUNITY)
Admission: RE | Admit: 2012-09-17 | Discharge: 2012-09-17 | Disposition: A | Discharge status: Home / Self Care | Payer: BC Managed Care – PPO | Source: Ambulatory Visit | Attending: Family Medicine | Admitting: Family Medicine

## 2012-09-17 ENCOUNTER — Ambulatory Visit (HOSPITAL_COMMUNITY): Payer: BC Managed Care – PPO

## 2012-09-17 ENCOUNTER — Other Ambulatory Visit: Payer: Self-pay | Admitting: Family Medicine

## 2012-09-17 DIAGNOSIS — D259 Leiomyoma of uterus, unspecified: Secondary | ICD-10-CM | POA: Insufficient documentation

## 2012-09-17 DIAGNOSIS — R102 Pelvic and perineal pain: Secondary | ICD-10-CM

## 2012-09-17 DIAGNOSIS — N949 Unspecified condition associated with female genital organs and menstrual cycle: Secondary | ICD-10-CM | POA: Insufficient documentation

## 2012-09-24 ENCOUNTER — Telehealth: Payer: Self-pay | Admitting: Family Medicine

## 2012-09-24 DIAGNOSIS — R109 Unspecified abdominal pain: Secondary | ICD-10-CM

## 2012-09-24 DIAGNOSIS — D259 Leiomyoma of uterus, unspecified: Secondary | ICD-10-CM

## 2012-09-24 NOTE — Telephone Encounter (Signed)
Pt continues to have lower abdominal pain, sharp in nature on and off, sometimes down in pelvis, pain with intercourse, irregular vaginal bleeding this past month Will schedule CT to see if there is abdominal pathology- ? Appendix, bowels Referral to GYN for fibroid uterus

## 2012-09-28 ENCOUNTER — Telehealth: Payer: Self-pay | Admitting: Family Medicine

## 2012-09-28 NOTE — Telephone Encounter (Signed)
Patient wanted the appointment cancelled

## 2012-09-30 ENCOUNTER — Ambulatory Visit (HOSPITAL_COMMUNITY): Payer: BC Managed Care – PPO

## 2012-10-05 ENCOUNTER — Ambulatory Visit (INDEPENDENT_AMBULATORY_CARE_PROVIDER_SITE_OTHER): Payer: BC Managed Care – PPO | Admitting: Obstetrics and Gynecology

## 2012-10-05 ENCOUNTER — Encounter: Payer: Self-pay | Admitting: Obstetrics and Gynecology

## 2012-10-05 VITALS — BP 120/78 | Ht 62.5 in | Wt 161.0 lb

## 2012-10-05 DIAGNOSIS — D259 Leiomyoma of uterus, unspecified: Secondary | ICD-10-CM

## 2012-10-05 NOTE — Progress Notes (Signed)
  Assessment:  Uterine fibroids 12 weeks to 14 week size, right side symptomatic Patient desires to be followed for a while   Plan:  counseled on menopause and how it effects fibroids by shrinking them return annually or prn. For right now the patient wishes to follow the fibroids and see how the discomfort progresses she is given information on Web M.D. and will be research on uterine fibroids  Subjective:  Jacqueline [name] is a 52 y.o. female, No obstetric history on file., who presents for referral for fibriods.  The following portions of the patient's history were reviewed and updated as appropriate: allergies, current medications, past medical & surgical history, & past family history.   There is no significant family history of breast or ovarian cancer.    Review of Systems Pertinent items are noted in HPI. BGYN: Patient's last menstrual period was 09/16/2012.   Objective:  BP 120/78  Ht 5' 2.5" (1.588 m)  Wt 161 lb (73.029 kg)  BMI 28.96 kg/m2  LMP 09/16/2012    BMI: Body mass index is 28.96 kg/(m^2).  General Appearance: Alert, appropriate appearance for age. No acute distress HEENT: Grossly normal Neck / Thyroid: Supple, no masses, nodes or enlargement Cardiovascular: Regular rate and rhythm. S1, S2, no murmur Lungs: Clear to auscultation bilaterally Back: No CVA tenderness Gastrointestinal: Soft, non-tender, no masses or organomegaly the fibroids can be felt in the suprapubic area particularly on the patient's right side Pelvic Exam: Vaginal: normal mucosa without prolapse or lesions Cervix: normal appearance Adnexa: normal bimanual exam Uterus: anteverted, irregular enlargement and Enlarged to 12-14 weeks size, irregular particularly on the right side. There is a small fibroid in the right cervical vaginal fornix Rectovaginal: declined by the patient  Christin Bach MD

## 2012-10-05 NOTE — Patient Instructions (Addendum)
Please due to Web M.D. to investigate fibroid uterus. Please review the information we did supply

## 2012-12-07 ENCOUNTER — Other Ambulatory Visit: Payer: Self-pay | Admitting: Family Medicine

## 2013-01-22 ENCOUNTER — Other Ambulatory Visit (HOSPITAL_COMMUNITY): Payer: Self-pay | Admitting: *Deleted

## 2013-01-22 DIAGNOSIS — Z139 Encounter for screening, unspecified: Secondary | ICD-10-CM

## 2013-01-26 ENCOUNTER — Ambulatory Visit (HOSPITAL_COMMUNITY)
Admission: RE | Admit: 2013-01-26 | Discharge: 2013-01-26 | Disposition: A | Discharge status: Home / Self Care | Payer: Self-pay | Source: Ambulatory Visit | Attending: *Deleted | Admitting: *Deleted

## 2013-01-26 ENCOUNTER — Ambulatory Visit (HOSPITAL_COMMUNITY): Payer: BC Managed Care – PPO

## 2013-01-26 DIAGNOSIS — Z139 Encounter for screening, unspecified: Secondary | ICD-10-CM

## 2013-01-29 ENCOUNTER — Encounter: Payer: BC Managed Care – PPO | Admitting: Family Medicine

## 2013-02-26 ENCOUNTER — Telehealth: Payer: Self-pay | Admitting: Family Medicine

## 2013-02-26 MED ORDER — FLUCONAZOLE 150 MG PO TABS: 150.0000 mg | ORAL_TABLET | Freq: Once | ORAL | Status: DC

## 2013-02-26 NOTE — Telephone Encounter (Signed)
Send diflucan 150mg  x 1

## 2013-02-26 NOTE — Telephone Encounter (Signed)
Pt has a yeast infection and wants to know if you can send her something in for that.

## 2013-02-26 NOTE — Telephone Encounter (Signed)
Meds refilled.

## 2013-04-13 ENCOUNTER — Encounter: Payer: Self-pay | Admitting: Family Medicine

## 2013-04-13 ENCOUNTER — Other Ambulatory Visit: Payer: Self-pay | Admitting: Family Medicine

## 2013-04-13 ENCOUNTER — Ambulatory Visit (INDEPENDENT_AMBULATORY_CARE_PROVIDER_SITE_OTHER): Payer: BC Managed Care – PPO | Admitting: Family Medicine

## 2013-04-13 VITALS — BP 132/78 | HR 60 | Temp 97.8°F | Resp 18 | Wt 164.0 lb

## 2013-04-13 DIAGNOSIS — N760 Acute vaginitis: Secondary | ICD-10-CM

## 2013-04-13 DIAGNOSIS — M25559 Pain in unspecified hip: Secondary | ICD-10-CM

## 2013-04-13 DIAGNOSIS — M7711 Lateral epicondylitis, right elbow: Secondary | ICD-10-CM

## 2013-04-13 DIAGNOSIS — M25551 Pain in right hip: Secondary | ICD-10-CM

## 2013-04-13 DIAGNOSIS — L739 Follicular disorder, unspecified: Secondary | ICD-10-CM

## 2013-04-13 DIAGNOSIS — M7710 Lateral epicondylitis, unspecified elbow: Secondary | ICD-10-CM

## 2013-04-13 DIAGNOSIS — L0292 Furuncle, unspecified: Secondary | ICD-10-CM

## 2013-04-13 LAB — WET PREP FOR TRICH, YEAST, CLUE: Trich, Wet Prep: NONE SEEN

## 2013-04-13 MED ORDER — FLUCONAZOLE 150 MG PO TABS: ORAL_TABLET | ORAL | Status: DC

## 2013-04-13 MED ORDER — NAPROXEN 500 MG PO TABS: 500.0000 mg | ORAL_TABLET | Freq: Two times a day (BID) | ORAL | Status: DC

## 2013-04-13 NOTE — Patient Instructions (Signed)
Take anti-inflammatories as prescribed Wear the tennis elbow strap during your work hours We will call with results F/U as needed or if elbow does not improveTennis Elbow Tennis elbow can happen in any sport or job where you overuse your elbow. It is caused by doing the same motion over and over. This makes small tears in the forearm muscles. It can also cause muscle redness, soreness, and puffiness (inflammation). HOME CARE  Rest.  Use your other hand or arm that is not affected. Change your grip.  Only take medicine as told by your doctor.  Put ice on the area after activity.  Put ice in a plastic bag.  Place a towel between your skin and the bag.  Leave the ice on for 15-20 minutes, 3-4 times a day.  Wear a splint or sling as told by your doctor. This lets the area rest and heal faster. GET HELP RIGHT AWAY IF: Your pain gets worse or does not improve in 2 weeks even with treatment. MAKE SURE YOU:   Understand these instructions.  Will watch your condition.  Will get help right away if you are not doing well or get worse. Document Released: 11/14/2009 Document Revised: 08/19/2011 Document Reviewed: 11/14/2009 [facility] Patient Information 2014 [place], [place].

## 2013-04-15 DIAGNOSIS — M25559 Pain in unspecified hip: Secondary | ICD-10-CM | POA: Insufficient documentation

## 2013-04-15 DIAGNOSIS — L739 Follicular disorder, unspecified: Secondary | ICD-10-CM | POA: Insufficient documentation

## 2013-04-15 NOTE — Assessment & Plan Note (Signed)
Normal exam, given reassurance, likley MSK pain

## 2013-04-15 NOTE — Assessment & Plan Note (Signed)
Vaginal candidiasis treated

## 2013-04-15 NOTE — Assessment & Plan Note (Signed)
Strap given for work NSAIDS handout

## 2013-04-15 NOTE — Assessment & Plan Note (Signed)
Currently resolving

## 2013-04-15 NOTE — Progress Notes (Signed)
  Subjective:    Patient ID: Jacqueline [name], female    DOB: 06-05-1961, 52 y.o.   MRN: [id number]  HPI   Pt here with multiple concerns. Right elbow and arm pain for the past 2-3 weeks. Has repetitive job pulling and pushing. Denies any swelling, pain when she rotates her arm a in different directions.  Vaginal discharge x 1 week, mild fishy odor, h/o recurrent BV. Also noticed a bump in hair vaginal area, no drainage, has already decreased in size Right hip pain past 4 days, no injury, at times when she stands feels a pulling sensation    Review of Systems  GEN- denies fatigue, fever, weight loss,weakness, recent illness HEENT- denies eye drainage, change in vision, nasal discharge, CVS- denies chest pain, palpitations RESP- denies SOB, cough, wheeze ABD- denies N/V, change in stools, abd pain GU- denies dysuria, hematuria, dribbling, incontinence MSK- + joint pain, muscle aches, injury       Objective:   Physical Exam  GEN- NAD, alert and oriented x 3  MSK- Back- Spine NT, neg SLR, normal flexion/ext, Hip- FROM, neg hip rock, normal gait    Right elbow- normal inspection, no swelling noted, TTP lateral epicondyle, pain with rotation forearm GU- normal external genitalia, vaginal mucosa pink and moist, cervix visualized no growth, no blood form os, + discharge, no CMT, no ovarian masses, uterus normal size Skin- small inflamed hair follicle in mons pubis, no abscess Ext- no edema Pulse- radial 2+     Assessment & Plan:

## 2013-06-01 ENCOUNTER — Telehealth: Payer: Self-pay | Admitting: Family Medicine

## 2013-06-01 NOTE — Telephone Encounter (Signed)
Please call her -She wants blood work but I am not sure what she wants

## 2013-07-14 ENCOUNTER — Encounter: Payer: Self-pay | Admitting: Family Medicine

## 2013-07-14 ENCOUNTER — Ambulatory Visit (INDEPENDENT_AMBULATORY_CARE_PROVIDER_SITE_OTHER): Payer: BC Managed Care – PPO | Admitting: Family Medicine

## 2013-07-14 VITALS — BP 130/80 | HR 80 | Temp 97.9°F | Resp 18 | Ht 62.5 in | Wt 170.0 lb

## 2013-07-14 DIAGNOSIS — M7710 Lateral epicondylitis, unspecified elbow: Secondary | ICD-10-CM

## 2013-07-14 DIAGNOSIS — J069 Acute upper respiratory infection, unspecified: Secondary | ICD-10-CM

## 2013-07-14 DIAGNOSIS — J019 Acute sinusitis, unspecified: Secondary | ICD-10-CM

## 2013-07-14 MED ORDER — AMOXICILLIN-POT CLAVULANATE 875-125 MG PO TABS: 1.0000 | ORAL_TABLET | Freq: Two times a day (BID) | ORAL | Status: DC

## 2013-07-14 MED ORDER — MELOXICAM 7.5 MG PO TABS: 7.5000 mg | ORAL_TABLET | Freq: Every day | ORAL | Status: DC

## 2013-07-14 MED ORDER — FLUTICASONE PROPIONATE 50 MCG/ACT NA SUSP: 2.0000 | Freq: Every day | NASAL | Status: DC

## 2013-07-14 MED ORDER — FLUCONAZOLE 150 MG PO TABS: 150.0000 mg | ORAL_TABLET | Freq: Once | ORAL | Status: DC

## 2013-07-14 NOTE — Patient Instructions (Signed)
Use the mucinex DM over the counter Sinus infection/Upper respiratory Take antibiotics Use nasal spray

## 2013-07-14 NOTE — Progress Notes (Signed)
Patient ID: Jacqueline [name], female   DOB: 1960/09/16, 53 y.o.   MRN: [id number]     Subjective:    Patient ID: Jacqueline [name], female    DOB: 11/24/1960, 53 y.o.   MRN: [id number]  Patient presents for Cough and chest congestion   Cough with production started 3 weeks, ago she has taken OTC meds which has helped some, cough is improving but now with worsening sinus drainage and pressure for > 1 week. No recent fever, pressure worse on left side.    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, she tried naprosyn with some improvement and the arm band but had difficulty with the band as it would move during the day She continues to get sharp pains that shoot from the elbow when she is using it, wants to try a different medication    Review Of Systems:  GEN- denies fatigue, fever, weight loss,weakness, recent illness HEENT- denies eye drainage, change in vision,+ nasal discharge, CVS- denies chest pain, palpitations RESP- denies SOB, +cough, wheeze MSK- + joint pain, muscle aches, injury Neuro- denies headache, dizziness, syncope, seizure activity       Objective:    BP 130/80  Pulse 80  Temp(Src) 97.9 F (36.6 C) (Oral)  Resp 18  Ht 5' 2.5" (1.588 m)  Wt 170 lb (77.111 kg)  BMI 30.58 kg/m2 GEN- NAD, alert and oriented x3 HEENT- PERRL, EOMI, non injected sclera, pink conjunctiva, MMM, oropharynx mild injection, TM clear bilat no effusion,  + maxillary sinus tenderness, inflammed turbinates,  Nasal drainage  Neck- Supple, no LAD CVS- RRR, no murmur RESP-CTAB MSK- TTP lateral epicondyle, no swelling no effusion, good ROM Elbow EXT- No edema Pulses- Radial 2+          Assessment & Plan:      Problem List Items Addressed This Visit   None    Visit Diagnoses   Sinusitis, acute    -  Primary    Relevant Medications       fluticasone (FLONASE) 50 MCG/ACT nasal spray       AMOXICILLIN-POT CLAVULANATE 875-125 MG PO TABS       fluconazole (DIFLUCAN) tablet 150 mg    URI, acute    - Mucinex DM              Note given Diflucan as she gets yeast infections with antibiotics   Note: This dictation was prepared with Dragon dictation along with smaller phrase technology. Any transcriptional errors that result from this process are unintentional.

## 2013-07-15 NOTE — Assessment & Plan Note (Signed)
Trial of mobic, and ICing area, if not better in 2 weeks, she agrees to ortho consult

## 2013-09-03 ENCOUNTER — Other Ambulatory Visit: Payer: Self-pay | Admitting: Family Medicine

## 2013-10-07 ENCOUNTER — Telehealth: Payer: Self-pay | Admitting: *Deleted

## 2013-10-07 MED ORDER — METRONIDAZOLE 500 MG PO TABS: 500.0000 mg | ORAL_TABLET | Freq: Two times a day (BID) | ORAL | Status: DC

## 2013-10-07 NOTE — Telephone Encounter (Signed)
Received call from patient.   Reports that she believes that she has a bacterial infection. States that there is foul odor and discharge noted vaginally.   Requested order for ABTx.   MD please advise.

## 2013-10-07 NOTE — Telephone Encounter (Signed)
MD made aware and new orders obtained.   Flagyl 500mg  (1) tab PO BID x7 days.   Prescription sent to pharmacy.   Call placed to patient and patient made aware.

## 2014-02-28 ENCOUNTER — Other Ambulatory Visit: Payer: Self-pay | Admitting: Family Medicine

## 2014-02-28 ENCOUNTER — Encounter: Payer: Self-pay | Admitting: Family Medicine

## 2014-02-28 ENCOUNTER — Ambulatory Visit (INDEPENDENT_AMBULATORY_CARE_PROVIDER_SITE_OTHER): Payer: BC Managed Care – PPO | Admitting: Family Medicine

## 2014-02-28 VITALS — BP 134/76 | HR 72 | Temp 97.7°F | Resp 14 | Ht 62.5 in | Wt 166.0 lb

## 2014-02-28 DIAGNOSIS — R490 Dysphonia: Secondary | ICD-10-CM

## 2014-02-28 DIAGNOSIS — R7303 Prediabetes: Secondary | ICD-10-CM

## 2014-02-28 DIAGNOSIS — R5381 Other malaise: Secondary | ICD-10-CM

## 2014-02-28 DIAGNOSIS — M25562 Pain in left knee: Secondary | ICD-10-CM

## 2014-02-28 DIAGNOSIS — J301 Allergic rhinitis due to pollen: Secondary | ICD-10-CM

## 2014-02-28 DIAGNOSIS — R7309 Other abnormal glucose: Secondary | ICD-10-CM

## 2014-02-28 DIAGNOSIS — R5383 Other fatigue: Secondary | ICD-10-CM

## 2014-02-28 DIAGNOSIS — M25569 Pain in unspecified knee: Secondary | ICD-10-CM

## 2014-02-28 MED ORDER — MELOXICAM 7.5 MG PO TABS: 7.5000 mg | ORAL_TABLET | Freq: Every day | ORAL | Status: DC | PRN

## 2014-02-28 MED ORDER — IPRATROPIUM BROMIDE 0.03 % NA SOLN
1.0000 | Freq: Two times a day (BID) | NASAL | Status: DC
Start: 2014-02-28 — End: 2015-09-06

## 2014-02-28 NOTE — Patient Instructions (Signed)
Referral to ENT  Try the atrovent nasal spray We will call with lab results Anti-inflammatory for your knee F/U as needed

## 2014-03-01 ENCOUNTER — Encounter: Payer: Self-pay | Admitting: Family Medicine

## 2014-03-01 LAB — COMPREHENSIVE METABOLIC PANEL
ALK PHOS: 72 U/L (ref 39–117)
ALT: 12 U/L (ref 0–35)
AST: 21 U/L (ref 0–37)
Albumin: 4.3 g/dL (ref 3.5–5.2)
BILIRUBIN TOTAL: 0.4 mg/dL (ref 0.2–1.2)
BUN: 14 mg/dL (ref 6–23)
CO2: 29 mEq/L (ref 19–32)
CREATININE: 1.08 mg/dL (ref 0.50–1.10)
Calcium: 9.9 mg/dL (ref 8.4–10.5)
Chloride: 102 mEq/L (ref 96–112)
GLUCOSE: 89 mg/dL (ref 70–99)
Potassium: 4 mEq/L (ref 3.5–5.3)
SODIUM: 136 meq/L (ref 135–145)
Total Protein: 7.7 g/dL (ref 6.0–8.3)

## 2014-03-01 LAB — CBC WITH DIFFERENTIAL/PLATELET
BASOS ABS: 0 10*3/uL (ref 0.0–0.1)
Basophils Relative: 1 % (ref 0–1)
Eosinophils Absolute: 0.1 10*3/uL (ref 0.0–0.7)
Eosinophils Relative: 3 % (ref 0–5)
HEMATOCRIT: 31.7 % — AB (ref 36.0–46.0)
Hemoglobin: 10 g/dL — ABNORMAL LOW (ref 12.0–15.0)
LYMPHS PCT: 48 % — AB (ref 12–46)
Lymphs Abs: 2.4 10*3/uL (ref 0.7–4.0)
MCH: 26 pg (ref 26.0–34.0)
MCHC: 31.5 g/dL (ref 30.0–36.0)
MCV: 82.6 fL (ref 78.0–100.0)
Monocytes Absolute: 0.5 10*3/uL (ref 0.1–1.0)
Monocytes Relative: 10 % (ref 3–12)
NEUTROS ABS: 1.9 10*3/uL (ref 1.7–7.7)
NEUTROS PCT: 38 % — AB (ref 43–77)
Platelets: 257 10*3/uL (ref 150–400)
RBC: 3.84 MIL/uL — ABNORMAL LOW (ref 3.87–5.11)
RDW: 17.4 % — AB (ref 11.5–15.5)
WBC: 4.9 10*3/uL (ref 4.0–10.5)

## 2014-03-01 LAB — TSH: TSH: 0.735 u[IU]/mL (ref 0.350–4.500)

## 2014-03-01 LAB — HEMOGLOBIN A1C
HEMOGLOBIN A1C: 6.3 % — AB (ref <5.7)
MEAN PLASMA GLUCOSE: 134 mg/dL — AB (ref <117)

## 2014-03-01 NOTE — Progress Notes (Signed)
Patient ID: Jacqueline [name], female   DOB: 09/22/1960, 53 y.o.   MRN: [id number]   Subjective:    Patient ID: Jacqueline [name], female    DOB: 10/18/1960, 53 y.o.   MRN: [id number]  Patient presents for L Knee Pain, Sinus Issues and Fatigue  patient here with multiple concerns. She continues to have sinus drainage and her voice is been worse more than usual for the past couple months. In the past she did have a nodule removed off for peripheral Vord. She denies any cough associated.  Fatigue she's had increased fatigue that she is working 12 hours a day 6 days a week and her sleep is not very good. She is hoping that her job will be changing hours soon and that she will be to get things back on track.  Left knee pain  X 2 weeks she has pain when she stoops down she does stand on a work permit and that however this takes: Her legs. She's not had any swelling or giving out of the knee. She's not tried any over-the-counter medications. No particular injury to the knee    Review Of Systems:  GEN-+fatigue, fever, weight loss,weakness, recent illness HEENT- denies eye drainage, change in vision, nasal discharge, CVS- denies chest pain, palpitations RESP- denies SOB, cough, wheeze ABD- denies N/V, change in stools, abd pain GU- denies dysuria, hematuria, dribbling, incontinence MSK- + joint pain, muscle aches, injury Neuro- denies headache, dizziness, syncope, seizure activity       Objective:    BP 134/76  Pulse 72  Temp(Src) 97.7 F (36.5 C) (Oral)  Resp 14  Ht 5' 2.5" (1.588 m)  Wt 166 lb (75.297 kg)  BMI 29.86 kg/m2  LMP 01/25/2014 GEN- NAD, alert and oriented x3 HEENT- PERRL, EOMI, non injected sclera, pink conjunctiva, MMM, oropharynx clear, nares clear, no maxillary sinus tenderness, hoarse voice Neck- Supple, no thyromegaly CVS- RRR, no murmur RESP-CTAB MSK- Bilat knee, normal inspection, good ROM, no recpitus, ligaments in tact EXT- No edema Pulses- Radial 2+         Assessment & Plan:      Problem List Items Addressed This Visit   Other malaise and fatigue   Relevant Orders      CBC with Differential (Completed)      Comprehensive metabolic panel (Completed)      TSH (Completed)   Left knee pain   Hoarse voice quality - Primary   ALLERGIC RHINITIS, SEASONAL    Other Visit Diagnoses   Borderline diabetes        Relevant Orders       Hemoglobin A1c (Completed)       Note: This dictation was prepared with Dragon dictation along with smaller phrase technology. Any transcriptional errors that result from this process are unintentional.

## 2014-03-01 NOTE — Assessment & Plan Note (Signed)
persistant rhinitis, no improvement with flonase, trial of atrovent ENT referral

## 2014-03-01 NOTE — Assessment & Plan Note (Addendum)
I think this is multifactorial in mostly because of poor sleep with the change in her job hours. Metabolic panel CBC and tsh

## 2014-03-01 NOTE — Assessment & Plan Note (Signed)
Referral to ENT °

## 2014-03-01 NOTE — Assessment & Plan Note (Addendum)
Trial of NSAIDS, hold in imaging

## 2014-03-03 LAB — VITAMIN B12: Vitamin B-12: 996 pg/mL — ABNORMAL HIGH (ref 211–911)

## 2014-03-03 LAB — IRON AND TIBC
%SAT: 57 % — ABNORMAL HIGH (ref 20–55)
IRON: 214 ug/dL — AB (ref 42–145)
TIBC: 378 ug/dL (ref 250–470)
UIBC: 164 ug/dL (ref 125–400)

## 2014-03-07 ENCOUNTER — Telehealth: Payer: Self-pay | Admitting: *Deleted

## 2014-03-07 NOTE — Telephone Encounter (Signed)
Pt has appt with Dr. Benjamine Mola ENT on Thursday Oct 15 at 3:30pm [place] location, tried to call pt no answer.

## 2014-03-09 NOTE — Telephone Encounter (Signed)
lmtrc

## 2014-03-10 ENCOUNTER — Encounter: Payer: Self-pay | Admitting: Internal Medicine

## 2014-03-10 ENCOUNTER — Other Ambulatory Visit: Payer: Self-pay | Admitting: *Deleted

## 2014-03-10 DIAGNOSIS — K5900 Constipation, unspecified: Secondary | ICD-10-CM

## 2014-03-10 DIAGNOSIS — D649 Anemia, unspecified: Secondary | ICD-10-CM

## 2014-03-10 DIAGNOSIS — K625 Hemorrhage of anus and rectum: Secondary | ICD-10-CM

## 2014-03-10 NOTE — Telephone Encounter (Signed)
Pt called back and aware of appt 

## 2014-03-14 ENCOUNTER — Telehealth: Payer: Self-pay | Admitting: Family Medicine

## 2014-03-14 NOTE — Telephone Encounter (Signed)
[phone]  Pt is calling something about a colonoscopy and other issues

## 2014-03-14 NOTE — Telephone Encounter (Signed)
Call placed to patient. LMTRC.  

## 2014-03-16 NOTE — Telephone Encounter (Signed)
Call returned by office.   States that she continues to have loose stools and small amount of bright red blood noted.   Advised that she has appointment with GI and they would be better to help her at this time.   States that she needs to re-schedule appointment with GI.   Advised to call their office and re-schedule appointment.

## 2014-03-24 ENCOUNTER — Ambulatory Visit (INDEPENDENT_AMBULATORY_CARE_PROVIDER_SITE_OTHER): Payer: BC Managed Care – PPO | Admitting: Otolaryngology

## 2014-03-24 DIAGNOSIS — K219 Gastro-esophageal reflux disease without esophagitis: Secondary | ICD-10-CM

## 2014-03-24 DIAGNOSIS — J3501 Chronic tonsillitis: Secondary | ICD-10-CM

## 2014-03-24 DIAGNOSIS — R490 Dysphonia: Secondary | ICD-10-CM

## 2014-04-12 ENCOUNTER — Ambulatory Visit: Payer: Self-pay | Admitting: Gastroenterology

## 2014-04-14 ENCOUNTER — Other Ambulatory Visit: Payer: Self-pay | Admitting: Family Medicine

## 2014-04-14 DIAGNOSIS — Z1231 Encounter for screening mammogram for malignant neoplasm of breast: Secondary | ICD-10-CM

## 2014-04-21 ENCOUNTER — Ambulatory Visit (INDEPENDENT_AMBULATORY_CARE_PROVIDER_SITE_OTHER): Payer: BC Managed Care – PPO | Admitting: Otolaryngology

## 2014-04-21 DIAGNOSIS — R490 Dysphonia: Secondary | ICD-10-CM

## 2014-04-21 DIAGNOSIS — K219 Gastro-esophageal reflux disease without esophagitis: Secondary | ICD-10-CM

## 2014-04-25 ENCOUNTER — Ambulatory Visit (HOSPITAL_COMMUNITY)
Admission: RE | Admit: 2014-04-25 | Discharge: 2014-04-25 | Disposition: A | Discharge status: Home / Self Care | Payer: BC Managed Care – PPO | Source: Ambulatory Visit | Attending: Family Medicine | Admitting: Family Medicine

## 2014-04-25 DIAGNOSIS — Z1231 Encounter for screening mammogram for malignant neoplasm of breast: Secondary | ICD-10-CM

## 2014-05-02 ENCOUNTER — Ambulatory Visit: Payer: Self-pay | Admitting: Gastroenterology

## 2014-05-20 ENCOUNTER — Telehealth: Payer: Self-pay | Admitting: Family Medicine

## 2014-05-20 NOTE — Telephone Encounter (Signed)
[facility]  Patient is calling to see if we can call something in for her bacterial infection she would like this done today if possible

## 2014-05-20 NOTE — Telephone Encounter (Signed)
Patient returned call.   States that she thinks she has BV again and is requesting ABTx.   Reports that she has discharge and foul odor. Denies itching or burning.   NKDA.   MD please advise.

## 2014-05-20 NOTE — Telephone Encounter (Signed)
Call placed to patient and patient made aware per VM.  

## 2014-05-20 NOTE — Telephone Encounter (Signed)
Call placed to patient. LMTRC.  

## 2014-05-20 NOTE — Telephone Encounter (Signed)
Needs appt for exam before antibiotics

## 2014-05-24 ENCOUNTER — Ambulatory Visit (INDEPENDENT_AMBULATORY_CARE_PROVIDER_SITE_OTHER): Payer: BC Managed Care – PPO | Admitting: Family Medicine

## 2014-05-24 ENCOUNTER — Encounter: Payer: Self-pay | Admitting: Family Medicine

## 2014-05-24 VITALS — BP 138/72 | HR 76 | Temp 97.8°F | Resp 18 | Ht 62.5 in | Wt 166.0 lb

## 2014-05-24 DIAGNOSIS — N760 Acute vaginitis: Secondary | ICD-10-CM

## 2014-05-24 LAB — WET PREP FOR TRICH, YEAST, CLUE
Trich, Wet Prep: NONE SEEN
Yeast Wet Prep HPF POC: NONE SEEN

## 2014-05-24 MED ORDER — METRONIDAZOLE 500 MG PO TABS: 500.0000 mg | ORAL_TABLET | Freq: Two times a day (BID) | ORAL | Status: DC

## 2014-05-24 NOTE — Progress Notes (Signed)
Patient ID: Jacqueline [name], female   DOB: May 12, 1961, 53 y.o.   MRN: [id number]       Subjective:    Patient ID: Jacqueline [name], female    DOB: 04-30-1961, 53 y.o.   MRN: [id number]  Patient presents for Vaginal Discharge  Patient with vaginal discharge for the past week. Itching with a fishy odor. She's had irregular cycles and perimenopausal state. She is sexually active with one partner. No abdominal pain nausea vomiting accompanied. Her LMP. Started on last Friday   Review Of Systems:  GEN- denies fatigue, fever, weight loss,weakness, recent illness HEENT- denies eye drainage, change in vision, nasal discharge, CVS- denies chest pain, palpitations RESP- denies SOB, cough, wheeze ABD- denies N/V, change in stools, abd pain GU- denies dysuria, hematuria, dribbling, incontinence MSK- denies joint pain, muscle aches, injury Neuro- denies headache, dizziness, syncope, seizure activity       Objective:    BP 138/72 mmHg  Pulse 76  Temp(Src) 97.8 F (36.6 C) (Oral)  Resp 18  Ht 5' 2.5" (1.588 m)  Wt 166 lb (75.297 kg)  BMI 29.86 kg/m2  LMP 05/20/2014 GEN- NAD, alert and oriented x3 GU- normal external genitalia, vaginal mucosa pink and moist, cervix visualized no growth,+ mild blood form os, minimal thin clear discharge, no CMT, no ovarian masses, uterus normal size         Assessment & Plan:      Problem List Items Addressed This Visit    None    Visit Diagnoses    Vaginitis and vulvovaginitis    -  Primary    Treat for BV, flagyl BID x 7 days    Relevant Orders       WET PREP FOR [place], YEAST, CLUE (Completed)       Note: This dictation was prepared with Dragon dictation along with smaller phrase technology. Any transcriptional errors that result from this process are unintentional.

## 2014-05-24 NOTE — Patient Instructions (Signed)
  F/u AS NEEDED Bacterial Vaginosis Bacterial vaginosis is an infection of the vagina. It happens when too many of certain germs (bacteria) grow in the vagina. HOME CARE  Take your medicine as told by your doctor.  Finish your medicine even if you start to feel better.  Do not have sex until you finish your medicine and are better.  Tell your sex partner that you have an infection. They should see their doctor for treatment.  Practice safe sex. Use condoms. Have only one sex partner. GET HELP IF:  You are not getting better after 3 days of treatment.  You have more grey fluid (discharge) coming from your vagina than before.  You have more pain than before.  You have a fever. MAKE SURE YOU:   Understand these instructions.  Will watch your condition.  Will get help right away if you are not doing well or get worse. Document Released: 03/05/2008 Document Revised: 03/17/2013 Document Reviewed: 01/06/2013 [facility] Patient Information 2015 [place], [place]. This information is not intended to replace advice given to you by your health care provider. Make sure you discuss any questions you have with your health care provider.

## 2014-06-22 ENCOUNTER — Encounter: Payer: Self-pay | Admitting: Gastroenterology

## 2014-06-22 ENCOUNTER — Ambulatory Visit: Payer: Self-pay | Admitting: Gastroenterology

## 2014-06-23 ENCOUNTER — Ambulatory Visit (INDEPENDENT_AMBULATORY_CARE_PROVIDER_SITE_OTHER): Payer: BLUE CROSS/BLUE SHIELD | Admitting: Otolaryngology

## 2014-06-23 DIAGNOSIS — R490 Dysphonia: Secondary | ICD-10-CM

## 2014-06-23 DIAGNOSIS — K219 Gastro-esophageal reflux disease without esophagitis: Secondary | ICD-10-CM

## 2014-07-26 ENCOUNTER — Ambulatory Visit (INDEPENDENT_AMBULATORY_CARE_PROVIDER_SITE_OTHER): Payer: BLUE CROSS/BLUE SHIELD | Admitting: Family Medicine

## 2014-07-26 ENCOUNTER — Encounter: Payer: Self-pay | Admitting: Family Medicine

## 2014-07-26 ENCOUNTER — Telehealth: Payer: Self-pay | Admitting: Family Medicine

## 2014-07-26 VITALS — BP 128/78 | HR 82 | Temp 97.7°F | Resp 16 | Ht 63.0 in | Wt 169.0 lb

## 2014-07-26 DIAGNOSIS — M1810 Unilateral primary osteoarthritis of first carpometacarpal joint, unspecified hand: Secondary | ICD-10-CM | POA: Insufficient documentation

## 2014-07-26 DIAGNOSIS — L700 Acne vulgaris: Secondary | ICD-10-CM | POA: Insufficient documentation

## 2014-07-26 DIAGNOSIS — J301 Allergic rhinitis due to pollen: Secondary | ICD-10-CM

## 2014-07-26 DIAGNOSIS — K130 Diseases of lips: Secondary | ICD-10-CM

## 2014-07-26 DIAGNOSIS — M1811 Unilateral primary osteoarthritis of first carpometacarpal joint, right hand: Secondary | ICD-10-CM

## 2014-07-26 DIAGNOSIS — S46912A Strain of unspecified muscle, fascia and tendon at shoulder and upper arm level, left arm, initial encounter: Secondary | ICD-10-CM

## 2014-07-26 DIAGNOSIS — S46919A Strain of unspecified muscle, fascia and tendon at shoulder and upper arm level, unspecified arm, initial encounter: Secondary | ICD-10-CM | POA: Insufficient documentation

## 2014-07-26 DIAGNOSIS — M19041 Primary osteoarthritis, right hand: Secondary | ICD-10-CM

## 2014-07-26 NOTE — Progress Notes (Signed)
Patient ID: Jacqueline [name], female   DOB: 1960-09-22, 54 y.o.   MRN: [id number]   Subjective:    Patient ID: Jacqueline [name], female    DOB: [birth year], 54 y.o.   MRN: [id number]  Patient presents for R Side Pain; L Shoulder Pain; Sinus Issues; and R Thumb Pain  Patient here with multiple concerns. She's had some sneezing and drainage from her nose the past week. She is not had any sinus headache but has felt a little pressure. The drainage is clear. She's not had a significant cough or fever. She has been using her nasal spray as prescribed.  Right thumb pain for the past few weeks. She's also had left shoulder pain for the past couple weeks. She does a lot of repetitive motions at work. This typically involves grabbing with her right hand. She has noticed some soreness near the base of her thumb as well as some mild swelling at times. She denies any paresthesia or tingling in the fingertips. She has not tried any medications for the pain. She also note when she has been lifting up her arm to put her shirt on she's had some pain towards the back of her left shoulder for the past week or so. No particular injury to the shoulder.  Last week she stretched up to turn on her ceiling fan and felt a stretching sensation on the right side of her abdomen. The pain was pretty intense initially but now there is very minimal soreness but she feels something underneath the skin more like a gristle   Review Of Systems:  GEN- denies fatigue, fever, weight loss,weakness, recent illness HEENT- denies eye drainage, change in vision, +nasal discharge, CVS- denies chest pain, palpitations RESP- denies SOB, cough, wheeze ABD- denies N/V, change in stools, abd pain GU- denies dysuria, hematuria, dribbling, incontinence MSK- +joint pain, +muscle aches, injury Neuro- denies headache, dizziness, syncope, seizure activity       Objective:    BP 128/78 mmHg  Pulse 82  Temp(Src) 97.7 F (36.5 C) (Oral)  Resp 16   Ht 5\' 3"  (1.6 m)  Wt 169 lb (76.658 kg)  BMI 29.94 kg/m2  LMP 06/29/2014 (Approximate) GEN- NAD, alert and oriented x3 HEENT- PERRL, EOMI, non injected sclera, pink conjunctiva, MMM, oropharynx clear, TM clear bilat no effusion, no  maxillary sinus tenderness, inflammed turbinates, + Nasal drainage  Neck- Supple, no LAD CVS- RRR, no murmur RESP-CTAB ABD-NABS,soft, NT, no CVA tenderness, ND, muscle palpated, mild spasm right side wall MSK- FROM neck, rotator cuff in tact, biceps in tact, no impignement signs, discomfort with back scratch on left side bilat hand, normal apperance, right thumb FROm, mild TTP base of thumb, no appreciable swelling, neg finkelsteins, wrist FROM, normal grasp EXT- No edema Pulses- Radial 2+         Assessment & Plan:      Problem List Items Addressed This Visit      Unprioritized   Shoulder strain    acute pain, NSAIDS given, ROm exercises, apply ICE      Degenerative arthritis of thumb    With her repetitive motions of concern about some degenerative changes in her thumb and hand. Her exam is not consistent with de Quervain's stenosing of Vitas. At any rate I have given her some anti-inflammatories to try. We may need to use a topical Voltaren gel as I'm concerned this will continue to worsen and she will need something because of her job.  Relevant Medications   Diclofenac 18 MG CAPS   ALLERGIC RHINITIS, SEASONAL - Primary    Symptoms more consistent with her allergies. I have given her Nortrel AD from the office to use in conjunction with her nasal spray I see no signs of overt sinusitis infection       Other Visit Diagnoses    Lesion of lip        Appears may be a blackhead, present many months and tender, will send to dermatolopgy for removal       Note: This dictation was prepared with Dragon dictation along with smaller phrase technology. Any transcriptional errors that result from this process are unintentional.

## 2014-07-26 NOTE — Telephone Encounter (Signed)
PT would like to have her dermatology apt after 3 pm

## 2014-07-26 NOTE — Patient Instructions (Signed)
Take allergy medication twice a day Take the anti-inflammatory three times a day as needed for the joints ICE to shoulder  Dermatology referral Call if not improved  F/U as needed

## 2014-07-26 NOTE — Assessment & Plan Note (Signed)
Symptoms more consistent with her allergies. I have given her Nortrel AD from the office to use in conjunction with her nasal spray I see no signs of overt sinusitis infection

## 2014-07-26 NOTE — Assessment & Plan Note (Addendum)
With her repetitive motions of concern about some degenerative changes in her thumb and hand. Her exam is not consistent with de Quervain's . At any rate I have given her some anti-inflammatories ( diclofenac 18mg  TID prn) to try. We may need to use a topical Voltaren gel as I'm concerned this will continue to worsen and she will need something because of her job.

## 2014-07-26 NOTE — Assessment & Plan Note (Signed)
acute pain, NSAIDS given, ROm exercises, apply ICE

## 2014-07-27 ENCOUNTER — Other Ambulatory Visit: Payer: Self-pay | Admitting: Family Medicine

## 2014-07-27 DIAGNOSIS — K130 Diseases of lips: Secondary | ICD-10-CM

## 2014-07-27 NOTE — Telephone Encounter (Signed)
Referral has been placed to Dermatology

## 2014-07-29 ENCOUNTER — Telehealth: Payer: Self-pay | Admitting: *Deleted

## 2014-07-29 NOTE — Telephone Encounter (Signed)
Pt called back and aware of appt to Dermatology

## 2014-07-29 NOTE — Telephone Encounter (Signed)
Patient has appt on 08/04/14 at 10:10am with [facility] Dermatology with Dr. Nevada Crane, Left message with pt to return my call for appt.

## 2014-08-04 ENCOUNTER — Encounter: Payer: Self-pay | Admitting: Gastroenterology

## 2014-08-04 ENCOUNTER — Ambulatory Visit (INDEPENDENT_AMBULATORY_CARE_PROVIDER_SITE_OTHER): Payer: BLUE CROSS/BLUE SHIELD | Admitting: Gastroenterology

## 2014-08-04 VITALS — BP 127/80 | HR 82 | Temp 96.7°F | Ht 62.5 in | Wt 167.2 lb

## 2014-08-04 DIAGNOSIS — K219 Gastro-esophageal reflux disease without esophagitis: Secondary | ICD-10-CM

## 2014-08-04 DIAGNOSIS — D649 Anemia, unspecified: Secondary | ICD-10-CM

## 2014-08-04 DIAGNOSIS — K5900 Constipation, unspecified: Secondary | ICD-10-CM

## 2014-08-04 MED ORDER — LINACLOTIDE 145 MCG PO CAPS: 145.0000 ug | ORAL_CAPSULE | Freq: Every day | ORAL | Status: DC

## 2014-08-04 NOTE — Assessment & Plan Note (Addendum)
Patient reports LPR. Continue to have breakthrough heartburn. Takes omeprazole once to twice daily. No prior EGD. Anemia with normal iron. +NSAIDS. Consider upper endoscopy at time of colonoscopy.  I have discussed the risks, alternatives, benefits with regards to but not limited to the risk of reaction to medication, bleeding, infection, perforation and the patient is agreeable to proceed. Written consent to be obtained.

## 2014-08-04 NOTE — Assessment & Plan Note (Signed)
Add linzess 145 mcg daily. 30 day voucher provided. Important to manage prior to colon prep to make sure prep is adequate.

## 2014-08-04 NOTE — Assessment & Plan Note (Signed)
54 y/o female with normocytic anemia in setting of heavy menses. No evidence to support IDA. She has some chronic intermittent constipation without significant change. +intermittent rectal bleeding. Recommend colonoscopy for further evaluation.  I have discussed the risks, alternatives, benefits with regards to but not limited to the risk of reaction to medication, bleeding, infection, perforation and the patient is agreeable to proceed. Written consent to be obtained.

## 2014-08-04 NOTE — Patient Instructions (Signed)
1. Linzess 123mcg daily on empty stomach for constipation. Make sure you are moving your bowels regularly prior to your colonoscopy prep. 2. Colonoscopy and possible upper endoscopy with Dr. Gala Romney. See separate instructions.

## 2014-08-04 NOTE — Progress Notes (Signed)
Primary Care Physician:  Vic Blackbird, MD  Primary Gastroenterologist:  Garfield Cornea, MD   Chief Complaint  Patient presents with  . Anemia  . Constipation    HPI:  Jacqueline [name] is a 54 y.o. female to schedule a colonoscopy. She has h/o constipation and anemia. No evidence of iron deficiency. Recent bout with constipation and had to take some laxatives. Some brbpr. BM every couple of days. No significant abdominal pain. She has had some ruq pain which she believes is muscle related, improved on diclofenac. No melena. Omeprazole once daily for less than six months. 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 No dysphagia. Still with heartburn. Menses irregular and heavy at times.   Last colonoscopy in 2003. No prior EGD.    Current Outpatient Prescriptions  Medication Sig Dispense Refill  . Chlorphen-PE-Acetaminophen 4-10-325 MG TABS Take 1 tablet by mouth 2 (two) times daily. Once daily    . Diclofenac 18 MG CAPS Take by mouth.    Marland Kitchen ipratropium (ATROVENT) 0.03 % nasal spray Place 1 spray into both nostrils 2 (two) times daily. 30 mL 6  . Multiple Vitamin (MULTIVITAMIN) tablet Take 1 tablet by mouth daily.    Marland Kitchen omeprazole (PRILOSEC) 20 MG capsule Take 20 mg by mouth 2 (two) times daily before a meal.    . CHIA SEED PO Take by mouth.     No current facility-administered medications for this visit.    Allergies as of 08/04/2014  . (No Known Allergies)    Past Medical History  Diagnosis Date  . Fibroid uterus   . Hyperlipidemia     Borderline  . GERD (gastroesophageal reflux disease)   . Anemia     Past Surgical History  Procedure Laterality Date  . Cholecystectomy    . Tubal ligation    . Colonoscopy  2003    Dr. Gala Romney: Anal canal hemorrhoids    Family History  Problem Relation Age of Onset  . Hypertension Maternal Aunt   . Cancer Maternal Aunt     Breast  . Cancer Maternal Grandmother      lymphnodes  . Colon cancer Neg Hx      History   Social History  . Marital Status: Single    Spouse Name: N/A  . Number of Children: 3  . Years of Education: N/A   Occupational History  . Not on file.   Social History Main Topics  . Smoking status: Never Smoker   . Smokeless tobacco: Never Used     Comment: Never smoked  . Alcohol Use: No  . Drug Use: No  . Sexual Activity: Yes   Other Topics Concern  . Not on file   Social History Narrative      ROS:  General: Negative for anorexia, weight loss, fever, chills, fatigue, weakness. Eyes: Negative for vision changes.  ENT: Negative for hoarseness, difficulty swallowing , nasal congestion. CV: Negative for chest pain, angina, palpitations, dyspnea on exertion, peripheral edema.  Respiratory: Negative for dyspnea at rest, dyspnea on exertion, cough, sputum, wheezing.  GI: See history of present illness. GU:  Negative for dysuria, hematuria, urinary incontinence, urinary frequency, nocturnal urination.  MS: Negative for joint pain, low back pain.  Derm: Negative for rash or itching.  Neuro: Negative for weakness, abnormal sensation, seizure, frequent headaches, memory loss, confusion.  Psych: Negative for anxiety, depression, suicidal ideation, hallucinations.  Endo: Negative for unusual weight change.  Heme: Negative for  bruising or bleeding. Allergy: Negative for rash or hives.    Physical Examination:  BP 127/80 mmHg  Pulse 82  Temp(Src) 96.7 F (35.9 C) (Oral)  Ht 5' 2.5" (1.588 m)  Wt 167 lb 3.2 oz (75.841 kg)  BMI 30.07 kg/m2  LMP 08/01/2014   General: Well-nourished, well-developed in no acute distress.  Head: Normocephalic, atraumatic.   Eyes: Conjunctiva pink, no icterus. Mouth: Oropharyngeal mucosa moist and pink , no lesions erythema or exudate. Neck: Supple without thyromegaly, masses, or lymphadenopathy.  Lungs: Clear to auscultation bilaterally.  Heart: Regular rate and rhythm, no murmurs rubs or gallops.  Abdomen: Bowel sounds  are normal, nontender, nondistended, no hepatosplenomegaly or masses, no abdominal bruits or    hernia , no rebound or guarding.   Rectal: not performed Extremities: No lower extremity edema. No clubbing or deformities.  Neuro: Alert and oriented x 4 , grossly normal neurologically.  Skin: Warm and dry, no rash or jaundice.   Psych: Alert and cooperative, normal mood and affect.  Labs: Lab Results  Component Value Date   WBC 4.9 02/28/2014   HGB 10.0* 02/28/2014   HCT 31.7* 02/28/2014   MCV 82.6 02/28/2014   PLT 257 02/28/2014   Lab Results  Component Value Date   IRON 214* 02/28/2014   TIBC 378 02/28/2014  iron sat 57%  No results found for: FERRITIN Lab Results  Component Value Date   VITAMINB12 996* 02/28/2014   No results found for: FOLATE  Lab Results  Component Value Date   CREATININE 1.08 02/28/2014   BUN 14 02/28/2014   NA 136 02/28/2014   K 4.0 02/28/2014   CL 102 02/28/2014   CO2 29 02/28/2014   Lab Results  Component Value Date   ALT 12 02/28/2014   AST 21 02/28/2014   ALKPHOS 72 02/28/2014   BILITOT 0.4 02/28/2014     Imaging Studies: No results found.

## 2014-08-08 ENCOUNTER — Other Ambulatory Visit: Payer: Self-pay

## 2014-08-08 DIAGNOSIS — K5909 Other constipation: Secondary | ICD-10-CM

## 2014-08-08 DIAGNOSIS — K219 Gastro-esophageal reflux disease without esophagitis: Secondary | ICD-10-CM

## 2014-08-08 DIAGNOSIS — D509 Iron deficiency anemia, unspecified: Secondary | ICD-10-CM

## 2014-08-08 MED ORDER — PEG 3350-KCL-NA BICARB-NACL 420 G PO SOLR: 4000.0000 mL | ORAL | Status: DC

## 2014-08-08 NOTE — Progress Notes (Signed)
cc'ed to pcp °

## 2014-08-12 ENCOUNTER — Telehealth: Payer: Self-pay | Admitting: Internal Medicine

## 2014-08-12 NOTE — Telephone Encounter (Signed)
Pt is scheduled for a tcs on 3/17 with [facility] and needs to speak to someone.  She wants to see if she can have it done before 11am on the same day. She's at work and her next break isn't until noon and I told her we close at noon today, but GF could LMOM. Patient agreed. Please call her cell number [phone]

## 2014-08-12 NOTE — Telephone Encounter (Signed)
LMOM that we could not move her up and she could call us back Monday

## 2014-08-16 ENCOUNTER — Encounter: Payer: BLUE CROSS/BLUE SHIELD | Admitting: Family Medicine

## 2014-08-22 ENCOUNTER — Telehealth: Payer: Self-pay

## 2014-08-22 NOTE — Telephone Encounter (Signed)
She will come by tomorrow to pick up note

## 2014-08-22 NOTE — Telephone Encounter (Signed)
Pt is calling to see if she can get a note for work on 08/24/14 because she works 12 hours and she will need to get off early to start her prep. Please advise

## 2014-08-22 NOTE — Telephone Encounter (Signed)
Work not okay for both 08/24/14 and 08/25/14.

## 2014-08-23 ENCOUNTER — Encounter: Payer: Self-pay | Admitting: Family Medicine

## 2014-08-23 ENCOUNTER — Ambulatory Visit (INDEPENDENT_AMBULATORY_CARE_PROVIDER_SITE_OTHER): Payer: BLUE CROSS/BLUE SHIELD | Admitting: Family Medicine

## 2014-08-23 ENCOUNTER — Telehealth: Payer: Self-pay | Admitting: Internal Medicine

## 2014-08-23 ENCOUNTER — Other Ambulatory Visit: Payer: Self-pay | Admitting: Family Medicine

## 2014-08-23 VITALS — BP 132/88 | HR 72 | Temp 98.0°F | Resp 18 | Ht 62.25 in | Wt 168.0 lb

## 2014-08-23 DIAGNOSIS — N898 Other specified noninflammatory disorders of vagina: Secondary | ICD-10-CM | POA: Diagnosis not present

## 2014-08-23 DIAGNOSIS — Z0000 Encounter for general adult medical examination without abnormal findings: Secondary | ICD-10-CM

## 2014-08-23 DIAGNOSIS — Z124 Encounter for screening for malignant neoplasm of cervix: Secondary | ICD-10-CM | POA: Diagnosis not present

## 2014-08-23 LAB — WET PREP FOR TRICH, YEAST, CLUE
Clue Cells Wet Prep HPF POC: NONE SEEN
Trich, Wet Prep: NONE SEEN
YEAST WET PREP: NONE SEEN

## 2014-08-23 MED ORDER — LINACLOTIDE 145 MCG PO CAPS: 145.0000 ug | ORAL_CAPSULE | Freq: Every day | ORAL | Status: DC

## 2014-08-23 MED ORDER — OMEPRAZOLE 20 MG PO CPDR: 20.0000 mg | DELAYED_RELEASE_CAPSULE | Freq: Two times a day (BID) | ORAL | Status: DC

## 2014-08-23 NOTE — Telephone Encounter (Signed)
Pt called to ask if she can still take her Linzess 30 minutes prior to meals since she is doing her prep.  I read her her instructions and it said to continue regular medications unless we told her otherwise. I told her to go ahead and take her meds since she wasn't told any different. Please call her back to confirm.[phone]

## 2014-08-23 NOTE — Patient Instructions (Signed)
I recommend eye visit once a year I recommend dental visit every 6 months Goal is to  Exercise 30 minutes 5 days a week Please get the labs done fasting for your cholesterol We will call with lab results F/U as needed

## 2014-08-23 NOTE — Progress Notes (Signed)
Patient ID: Jacqueline [name], female   DOB: October 04, 1960, 54 y.o.   MRN: [id number]   Subjective:    Patient ID: Jacqueline [name], female    DOB: 02-22-1961, 54 y.o.   MRN: [id number]  Patient presents for Annual Exam  Patient here for annual physical exam. She has no specific concerns today. She does request wet prep to look for use infection as she had some mild discharge or week or so ago. She denies any dysuria no change in her bowels. She is due to have colonoscopy this week. Her mammogram is up-to-date. Her tetanus booster and immunizations are up-to-date. She is still being followed by ear nose and throat for her chronic hoarse voice. She still has menstrual cycle though it is now irregular  last about 5 days and is heavy on the first day she has known history of fibroid uterus as well Last Pap smear 2013 was normal   Review Of Systems:  GEN- denies fatigue, fever, weight loss,weakness, recent illness HEENT- denies eye drainage, change in vision, nasal discharge, CVS- denies chest pain, palpitations RESP- denies SOB, cough, wheeze ABD- denies N/V, change in stools, abd pain GU- denies dysuria, hematuria, dribbling, incontinence MSK- denies joint pain, muscle aches, injury Neuro- denies headache, dizziness, syncope, seizure activity       Objective:    BP 132/88 mmHg  Pulse 72  Temp(Src) 98 F (36.7 C) (Oral)  Resp 18  Ht 5' 2.25" (1.581 m)  Wt 168 lb (76.204 kg)  BMI 30.49 kg/m2  LMP 08/01/2014 GEN- NAD, alert and oriented x3 HEENT- PERRL, EOMI, non injected sclera, pink conjunctiva, MMM, oropharynx clear, hoarse voice Neck- Supple, no thyromegaly Breast- normal symmetry, no nipple inversion,no nipple drainage, no nodules or lumps felt Nodes- no axillary nodes CVS- RRR, no murmur RESP-CTAB ABD-NABS,soft,NT,ND GU- normal external genitalia, vaginal mucosa pink and moist, cervix visualized no growth, no blood form os, minimal thin clear discharge, no CMT, no ovarian masses,  uterus normal size EXT- No edema Pulses- Radial, DP- 2+        Assessment & Plan:      Problem List Items Addressed This Visit    None    Visit Diagnoses    Vaginal discharge    -  Primary    Neg wet prep    Relevant Orders    WET PREP FOR [place], YEAST, CLUE (Completed)    Routine general medical examination at a health care facility        CPE done, PAP done, colonoscopy this week, mammo UTD    Relevant Orders    Lipid panel    CBC with Differential/Platelet    Comprehensive metabolic panel    Vitamin D, 25-hydroxy    Cervical cancer screening        Relevant Orders    PAP, ThinPrep ASCUS Rflx HPV Rflx Type       Note: This dictation was prepared with Dragon dictation along with smaller phrase technology. Any transcriptional errors that result from this process are unintentional.

## 2014-08-24 LAB — PAP THINPREP ASCUS RFLX HPV RFLX TYPE

## 2014-08-24 NOTE — Telephone Encounter (Signed)
Tried to call with no answer  

## 2014-08-24 NOTE — Telephone Encounter (Signed)
Pt is aware to keep taking her medications

## 2014-08-25 ENCOUNTER — Encounter (HOSPITAL_COMMUNITY): Payer: Self-pay | Admitting: *Deleted

## 2014-08-25 ENCOUNTER — Ambulatory Visit (HOSPITAL_COMMUNITY)
Admission: RE | Admit: 2014-08-25 | Discharge: 2014-08-25 | Disposition: A | Discharge status: Home / Self Care | Payer: BLUE CROSS/BLUE SHIELD | Source: Ambulatory Visit | Attending: Internal Medicine | Admitting: Internal Medicine

## 2014-08-25 ENCOUNTER — Encounter (HOSPITAL_COMMUNITY)
Admission: RE | Disposition: A | Discharge status: Home / Self Care | Payer: Self-pay | Source: Ambulatory Visit | Attending: Internal Medicine

## 2014-08-25 DIAGNOSIS — Z9851 Tubal ligation status: Secondary | ICD-10-CM | POA: Diagnosis not present

## 2014-08-25 DIAGNOSIS — Z9049 Acquired absence of other specified parts of digestive tract: Secondary | ICD-10-CM | POA: Insufficient documentation

## 2014-08-25 DIAGNOSIS — K2210 Ulcer of esophagus without bleeding: Secondary | ICD-10-CM | POA: Insufficient documentation

## 2014-08-25 DIAGNOSIS — Z1211 Encounter for screening for malignant neoplasm of colon: Secondary | ICD-10-CM | POA: Insufficient documentation

## 2014-08-25 DIAGNOSIS — E785 Hyperlipidemia, unspecified: Secondary | ICD-10-CM | POA: Insufficient documentation

## 2014-08-25 DIAGNOSIS — K5909 Other constipation: Secondary | ICD-10-CM

## 2014-08-25 DIAGNOSIS — K2100 Gastro-esophageal reflux disease with esophagitis, without bleeding: Secondary | ICD-10-CM | POA: Insufficient documentation

## 2014-08-25 DIAGNOSIS — K219 Gastro-esophageal reflux disease without esophagitis: Secondary | ICD-10-CM

## 2014-08-25 DIAGNOSIS — D509 Iron deficiency anemia, unspecified: Secondary | ICD-10-CM

## 2014-08-25 DIAGNOSIS — D649 Anemia, unspecified: Secondary | ICD-10-CM | POA: Insufficient documentation

## 2014-08-25 HISTORY — PX: COLONOSCOPY: SHX5424

## 2014-08-25 HISTORY — PX: ESOPHAGOGASTRODUODENOSCOPY: SHX5428

## 2014-08-25 LAB — HPV DNA, RELFEX TYPE 16/18: HPV DNA High Risk: NOT DETECTED

## 2014-08-25 SURGERY — COLONOSCOPY
Anesthesia: Moderate Sedation

## 2014-08-25 MED ORDER — LIDOCAINE VISCOUS 2 % MT SOLN
OROMUCOSAL | Status: AC
  Filled 2014-08-25: qty 15

## 2014-08-25 MED ORDER — MIDAZOLAM HCL 5 MG/5ML IJ SOLN
INTRAMUSCULAR | Status: DC | PRN
  Administered 2014-08-25 (×2): 2 mg via INTRAVENOUS
  Administered 2014-08-25: 1 mg via INTRAVENOUS

## 2014-08-25 MED ORDER — MIDAZOLAM HCL 5 MG/5ML IJ SOLN
INTRAMUSCULAR | Status: AC
  Filled 2014-08-25: qty 10

## 2014-08-25 MED ORDER — LIDOCAINE VISCOUS 2 % MT SOLN
OROMUCOSAL | Status: DC | PRN
  Administered 2014-08-25: 4 mL via OROMUCOSAL

## 2014-08-25 MED ORDER — MEPERIDINE HCL 100 MG/ML IJ SOLN
INTRAMUSCULAR | Status: AC
  Filled 2014-08-25: qty 2

## 2014-08-25 MED ORDER — MEPERIDINE HCL 100 MG/ML IJ SOLN
INTRAMUSCULAR | Status: DC | PRN
  Administered 2014-08-25: 50 mg via INTRAVENOUS
  Administered 2014-08-25: 25 mg via INTRAVENOUS

## 2014-08-25 MED ORDER — ONDANSETRON HCL 4 MG/2ML IJ SOLN
INTRAMUSCULAR | Status: AC
  Filled 2014-08-25: qty 2

## 2014-08-25 MED ORDER — SODIUM CHLORIDE 0.9 % IV SOLN
INTRAVENOUS | Status: DC
  Administered 2014-08-25: 12:00:00 via INTRAVENOUS

## 2014-08-25 MED ORDER — STERILE WATER FOR IRRIGATION IR SOLN
Status: DC | PRN
  Administered 2014-08-25: 14:00:00

## 2014-08-25 NOTE — H&P (View-Only) (Signed)
Primary Care Physician:  Vic Blackbird, MD  Primary Gastroenterologist:  Garfield Cornea, MD   Chief Complaint  Patient presents with  . Anemia  . Constipation    HPI:  Jacqueline [name] is a 54 y.o. female to schedule a colonoscopy. She has h/o constipation and anemia. No evidence of iron deficiency. Recent bout with constipation and had to take some laxatives. Some brbpr. BM every couple of days. No significant abdominal pain. She has had some ruq pain which she believes is muscle related, improved on diclofenac. No melena. Omeprazole once daily for less than six months. 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 No dysphagia. Still with heartburn. Menses irregular and heavy at times.   Last colonoscopy in 2003. No prior EGD.    Current Outpatient Prescriptions  Medication Sig Dispense Refill  . Chlorphen-PE-Acetaminophen 4-10-325 MG TABS Take 1 tablet by mouth 2 (two) times daily. Once daily    . Diclofenac 18 MG CAPS Take by mouth.    Marland Kitchen ipratropium (ATROVENT) 0.03 % nasal spray Place 1 spray into both nostrils 2 (two) times daily. 30 mL 6  . Multiple Vitamin (MULTIVITAMIN) tablet Take 1 tablet by mouth daily.    Marland Kitchen omeprazole (PRILOSEC) 20 MG capsule Take 20 mg by mouth 2 (two) times daily before a meal.    . CHIA SEED PO Take by mouth.     No current facility-administered medications for this visit.    Allergies as of 08/04/2014  . (No Known Allergies)    Past Medical History  Diagnosis Date  . Fibroid uterus   . Hyperlipidemia     Borderline  . GERD (gastroesophageal reflux disease)   . Anemia     Past Surgical History  Procedure Laterality Date  . Cholecystectomy    . Tubal ligation    . Colonoscopy  2003    Dr. Gala Romney: Anal canal hemorrhoids    Family History  Problem Relation Age of Onset  . Hypertension Maternal Aunt   . Cancer Maternal Aunt     Breast  . Cancer Maternal Grandmother      lymphnodes  . Colon cancer Neg Hx      History   Social History  . Marital Status: Single    Spouse Name: N/A  . Number of Children: 3  . Years of Education: N/A   Occupational History  . Not on file.   Social History Main Topics  . Smoking status: Never Smoker   . Smokeless tobacco: Never Used     Comment: Never smoked  . Alcohol Use: No  . Drug Use: No  . Sexual Activity: Yes   Other Topics Concern  . Not on file   Social History Narrative      ROS:  General: Negative for anorexia, weight loss, fever, chills, fatigue, weakness. Eyes: Negative for vision changes.  ENT: Negative for hoarseness, difficulty swallowing , nasal congestion. CV: Negative for chest pain, angina, palpitations, dyspnea on exertion, peripheral edema.  Respiratory: Negative for dyspnea at rest, dyspnea on exertion, cough, sputum, wheezing.  GI: See history of present illness. GU:  Negative for dysuria, hematuria, urinary incontinence, urinary frequency, nocturnal urination.  MS: Negative for joint pain, low back pain.  Derm: Negative for rash or itching.  Neuro: Negative for weakness, abnormal sensation, seizure, frequent headaches, memory loss, confusion.  Psych: Negative for anxiety, depression, suicidal ideation, hallucinations.  Endo: Negative for unusual weight change.  Heme: Negative for  bruising or bleeding. Allergy: Negative for rash or hives.    Physical Examination:  BP 127/80 mmHg  Pulse 82  Temp(Src) 96.7 F (35.9 C) (Oral)  Ht 5' 2.5" (1.588 m)  Wt 167 lb 3.2 oz (75.841 kg)  BMI 30.07 kg/m2  LMP 08/01/2014   General: Well-nourished, well-developed in no acute distress.  Head: Normocephalic, atraumatic.   Eyes: Conjunctiva pink, no icterus. Mouth: Oropharyngeal mucosa moist and pink , no lesions erythema or exudate. Neck: Supple without thyromegaly, masses, or lymphadenopathy.  Lungs: Clear to auscultation bilaterally.  Heart: Regular rate and rhythm, no murmurs rubs or gallops.  Abdomen: Bowel sounds  are normal, nontender, nondistended, no hepatosplenomegaly or masses, no abdominal bruits or    hernia , no rebound or guarding.   Rectal: not performed Extremities: No lower extremity edema. No clubbing or deformities.  Neuro: Alert and oriented x 4 , grossly normal neurologically.  Skin: Warm and dry, no rash or jaundice.   Psych: Alert and cooperative, normal mood and affect.  Labs: Lab Results  Component Value Date   WBC 4.9 02/28/2014   HGB 10.0* 02/28/2014   HCT 31.7* 02/28/2014   MCV 82.6 02/28/2014   PLT 257 02/28/2014   Lab Results  Component Value Date   IRON 214* 02/28/2014   TIBC 378 02/28/2014  iron sat 57%  No results found for: FERRITIN Lab Results  Component Value Date   VITAMINB12 996* 02/28/2014   No results found for: FOLATE  Lab Results  Component Value Date   CREATININE 1.08 02/28/2014   BUN 14 02/28/2014   NA 136 02/28/2014   K 4.0 02/28/2014   CL 102 02/28/2014   CO2 29 02/28/2014   Lab Results  Component Value Date   ALT 12 02/28/2014   AST 21 02/28/2014   ALKPHOS 72 02/28/2014   BILITOT 0.4 02/28/2014     Imaging Studies: No results found.

## 2014-08-25 NOTE — Progress Notes (Signed)
Jacqueline [name] took over case at 1410. Left her with 25mg  Demerol and 1mg  Versed already opened. Also, left her with 100 mg Demerol and 5mg  Versed unopened. Report given.

## 2014-08-25 NOTE — Op Note (Signed)
[facility] [address] [place], [address]   COLONOSCOPY PROCEDURE REPORT  PATIENT: Islay, Polanco  MR#: [id number] BIRTHDATE: 02-Apr-1961 , 53  yrs. old GENDER: female ENDOSCOPIST: R.  Garfield Cornea, MD FACP [facility] REFERRED SW:NIOEVOJ [place], M.D. PROCEDURE DATE:  09/14/2014 PROCEDURE:   Colonoscopy, screening INDICATIONS:Average risk colorectal cancer screening examination. MEDICATIONS: Versed 5 mg IV and Demerol 75 mg IV in divided doses. Zofran 4 mg IV. ASA CLASS:       Class II  CONSENT: The risks, benefits, alternatives and imponderables including but not limited to bleeding, perforation as well as the possibility of a missed lesion have been reviewed.  The potential for biopsy, lesion removal, etc. have also been discussed. Questions have been answered.  All parties agreeable.  Please see the history and physical in the medical record for more information.  DESCRIPTION OF PROCEDURE:   After the risks benefits and alternatives of the procedure were thoroughly explained, informed consent was obtained.  The digital rectal exam revealed no abnormalities of the rectum.   The [id number] ([id number])  endoscope was introduced through the anus and advanced to the cecum, which was identified by both the appendix and ileocecal valve. No adverse events experienced.   The quality of the prep was adequate  The instrument was then slowly withdrawn as the colon was fully examined.      COLON FINDINGS: Normal-appearing rectal mucosa.  Rectal vault small. Unable to retroflex although I attempted.  Rectal mucosa seen well on?"face.  The colonic mucosa appeared entirely normal.  Retroflexed views revealed no abnormalities. .  Withdrawal time=7 minutes 0 seconds.  The scope was withdrawn and the procedure completed. COMPLICATIONS: There were no immediate complications.  ENDOSCOPIC IMPRESSION: Normal colonoscopy  RECOMMENDATIONS: Repeat colonoscopy in 10 years for  screening purposes.  eSigned:  R. Garfield Cornea, MD Rosalita Chessman [facility] Sep 14, 2014 2:21 PM   cc:  CPT CODES: ICD CODES:  The ICD and CPT codes recommended by this software are interpretations from the data that the clinical staff has captured with the software.  The verification of the translation of this report to the ICD and CPT codes and modifiers is the sole responsibility of the health care institution and practicing physician where this report was generated.  [place]. will not be held responsible for the validity of the ICD and CPT codes included on this report.  AMA assumes no liability for data contained or not contained herein. CPT is a Designer, television/film set of the [employer].

## 2014-08-25 NOTE — Interval H&P Note (Signed)
History and Physical Interval Note:  08/25/2014 1:57 PM  Tityana Filbert Berthold  has presented today for surgery, with the diagnosis of GERD/ANEMIA/CONSTIPATION  The various methods of treatment have been discussed with the patient and family. After consideration of risks, benefits and other options for treatment, the patient has consented to  Procedure(s) with comments: COLONOSCOPY (N/A) - 1200 ESOPHAGOGASTRODUODENOSCOPY (EGD) (N/A) as a surgical intervention .  The patient's history has been reviewed, patient examined, no change in status, stable for surgery.  I have reviewed the patient's chart and labs.  Questions were answered to the patient's satisfaction.     Mycala Warshawsky  No change. EGD colonoscopy per plan.The risks, benefits, limitations, imponderables and alternatives regarding both EGD and colonoscopy have been reviewed with the patient. Questions have been answered. All parties agreeable.

## 2014-08-25 NOTE — Op Note (Signed)
[facility] [address] [place], [address]   ENDOSCOPY PROCEDURE REPORT  PATIENT: Jacqueline [name], Jacqueline [name]  MR#: [id number] BIRTHDATE: 1960/12/05 , 53  yrs. old GENDER: female ENDOSCOPIST: R.  Garfield Cornea, MD FACP FACG REFERRED BY:  Vic Blackbird, M.D. PROCEDURE DATE:  16-Sep-2014 PROCEDURE:  EGD, diagnostic INDICATIONS:  long-standing somewhat refractory GERD. MEDICATIONS: Versed 5 mg IV and Demerol 75 mg IV in divided doses. Zofran 4 mg IV.  Xylocaine gel orally. ASA CLASS:      Class II  CONSENT: The risks, benefits, limitations, alternatives and imponderables have been discussed.  The potential for biopsy, esophogeal dilation, etc. have also been reviewed.  Questions have been answered.  All parties agreeable.  Please see the history and physical in the medical record for more information.  DESCRIPTION OF PROCEDURE: After the risks benefits and alternatives of the procedure were thoroughly explained, informed consent was obtained.  The [id number] ([id number]) endoscope was introduced through the mouth and advanced to the second portion of the duodenum , limited by Without limitations. The instrument was slowly withdrawn as the mucosa was fully examined.    Single distal esophageal erosion with overlying excoriated mucosa. No Barrett's esophagus.  Stomach empty.  No hiatal hernia. Normal-appearing gastric mucosa.  Patent pylorus.  Prominent but normal-appearing ampulla of Vater.  The first and second portion of the duodenum appeared normal.  Retroflexed views revealed as previously described.     The scope was then withdrawn from the patient and the procedure completed.  COMPLICATIONS: There were no immediate complications.  ENDOSCOPIC IMPRESSION: Mild erosive reflux esophagitis; otherwise normal EGD  RECOMMENDATIONS: Stop omeprazole; begin a course of Dexilant 60 mg daily. See colonoscopy report.  REPEAT EXAM:  eSigned:  R. Garfield Cornea, MD Rosalita Chessman [facility]  2014/09/16 2:30 PM    CC:  CPT CODES: ICD CODES:  The ICD and CPT codes recommended by this software are interpretations from the data that the clinical staff has captured with the software.  The verification of the translation of this report to the ICD and CPT codes and modifiers is the sole responsibility of the health care institution and practicing physician where this report was generated.  [place]. will not be held responsible for the validity of the ICD and CPT codes included on this report.  AMA assumes no liability for data contained or not contained herein. CPT is a Designer, television/film set of the [employer].  PATIENT NAME:  [name], [name] MR#: [id number]

## 2014-08-25 NOTE — Discharge Instructions (Addendum)
Colonoscopy Discharge Instructions  Read the instructions outlined below and refer to this sheet in the next few weeks. These discharge instructions provide you with general information on caring for yourself after you leave the hospital. Your doctor may also give you specific instructions. While your treatment has been planned according to the most current medical practices available, unavoidable complications occasionally occur. If you have any problems or questions after discharge, call Dr. Gala Romney at [phone]. ACTIVITY  You may resume your regular activity, but move at a slower pace for the next 24 hours.   Take frequent rest periods for the next 24 hours.   Walking will help get rid of the air and reduce the bloated feeling in your belly (abdomen).   No driving for 24 hours (because of the medicine (anesthesia) used during the test).    Do not sign any important legal documents or operate any machinery for 24 hours (because of the anesthesia used during the test).  NUTRITION  Drink plenty of fluids.   You may resume your normal diet as instructed by your doctor.   Begin with a light meal and progress to your normal diet. Heavy or fried foods are harder to digest and may make you feel sick to your stomach (nauseated).   Avoid alcoholic beverages for 24 hours or as instructed.  MEDICATIONS  You may resume your normal medications unless your doctor tells you otherwise.  WHAT YOU CAN EXPECT TODAY  Some feelings of bloating in the abdomen.   Passage of more gas than usual.   Spotting of blood in your stool or on the toilet paper.  IF YOU HAD POLYPS REMOVED DURING THE COLONOSCOPY:  No aspirin products for 7 days or as instructed.   No alcohol for 7 days or as instructed.   Eat a soft diet for the next 24 hours.  FINDING OUT THE RESULTS OF YOUR TEST Not all test results are available during your visit. If your test results are not back during the visit, make an appointment  with your caregiver to find out the results. Do not assume everything is normal if you have not heard from your caregiver or the medical facility. It is important for you to follow up on all of your test results.  SEEK IMMEDIATE MEDICAL ATTENTION IF:  You have more than a spotting of blood in your stool.   Your belly is swollen (abdominal distention).   You are nauseated or vomiting.   You have a temperature over 101.  You have abdominal pain or discomfort that is severe or gets worse throughout the day.  Colonoscopy Discharge Instructions  Read the instructions outlined below and refer to this sheet in the next few weeks. These discharge instructions provide you with general information on caring for yourself after you leave the hospital. Your doctor may also give you specific instructions. While your treatment has been planned according to the most current medical practices available, unavoidable complications occasionally occur. If you have any problems or questions after discharge, call Dr. Gala Romney at [phone]. ACTIVITY You may resume your regular activity, but move at a slower pace for the next 24 hours.  Take frequent rest periods for the next 24 hours.  Walking will help get rid of the air and reduce the bloated feeling in your belly (abdomen).  No driving for 24 hours (because of the medicine (anesthesia) used during the test).   Do not sign any important legal documents or operate any machinery for 24 hours (  because of the anesthesia used during the test).  NUTRITION Drink plenty of fluids.  You may resume your normal diet as instructed by your doctor.  Begin with a light meal and progress to your normal diet. Heavy or fried foods are harder to digest and may make you feel sick to your stomach (nauseated).  Avoid alcoholic beverages for 24 hours or as instructed.  MEDICATIONS You may resume your normal medications unless your doctor tells you otherwise.  WHAT YOU CAN EXPECT  TODAY Some feelings of bloating in the abdomen.  Passage of more gas than usual.  Spotting of blood in your stool or on the toilet paper.  IF YOU HAD POLYPS REMOVED DURING THE COLONOSCOPY: No aspirin products for 7 days or as instructed.  No alcohol for 7 days or as instructed.  Eat a soft diet for the next 24 hours.  FINDING OUT THE RESULTS OF YOUR TEST Not all test results are available during your visit. If your test results are not back during the visit, make an appointment with your caregiver to find out the results. Do not assume everything is normal if you have not heard from your caregiver or the medical facility. It is important for you to follow up on all of your test results.  SEEK IMMEDIATE MEDICAL ATTENTION IF: You have more than a spotting of blood in your stool.  Your belly is swollen (abdominal distention).  You are nauseated or vomiting.  You have a temperature over 101.  You have abdominal pain or discomfort that is severe or gets worse throughout the day. EGD Discharge instructions Please read the instructions outlined below and refer to this sheet in the next few weeks. These discharge instructions provide you with general information on caring for yourself after you leave the hospital. Your doctor may also give you specific instructions. While your treatment has been planned according to the most current medical practices available, unavoidable complications occasionally occur. If you have any problems or questions after discharge, please call your doctor. ACTIVITY You may resume your regular activity but move at a slower pace for the next 24 hours.  Take frequent rest periods for the next 24 hours.  Walking will help expel (get rid of) the air and reduce the bloated feeling in your abdomen.  No driving for 24 hours (because of the anesthesia (medicine) used during the test).  You may shower.  Do not sign any important legal documents or operate any machinery for 24 hours  (because of the anesthesia used during the test).  NUTRITION Drink plenty of fluids.  You may resume your normal diet.  Begin with a light meal and progress to your normal diet.  Avoid alcoholic beverages for 24 hours or as instructed by your caregiver.  MEDICATIONS You may resume your normal medications unless your caregiver tells you otherwise.  WHAT YOU CAN EXPECT TODAY You may experience abdominal discomfort such as a feeling of fullness or gas pains.  FOLLOW-UP Your doctor will discuss the results of your test with you.  SEEK IMMEDIATE MEDICAL ATTENTION IF ANY OF THE FOLLOWING OCCUR: Excessive nausea (feeling sick to your stomach) and/or vomiting.  Severe abdominal pain and distention (swelling).  Trouble swallowing.  Temperature over 101 F (37.8 C).  Rectal bleeding or vomiting of blood.    GERD and constipation information provided  Continue Linzess 145 daily  Repeat colonoscopy in 10 years for screening purposes  Stop omeprazole; begin a trial of Dexilant 60 mg daily-go by  my office for a 3 week supply of samples.

## 2014-08-26 ENCOUNTER — Encounter (HOSPITAL_COMMUNITY): Payer: Self-pay | Admitting: Internal Medicine

## 2014-08-28 LAB — CBC WITH DIFFERENTIAL/PLATELET
BASOS ABS: 0 10*3/uL (ref 0.0–0.1)
BASOS PCT: 1 % (ref 0–1)
Eosinophils Absolute: 0.1 10*3/uL (ref 0.0–0.7)
Eosinophils Relative: 3 % (ref 0–5)
HEMATOCRIT: 34.5 % — AB (ref 36.0–46.0)
Hemoglobin: 11.2 g/dL — ABNORMAL LOW (ref 12.0–15.0)
Lymphocytes Relative: 35 % (ref 12–46)
Lymphs Abs: 1.6 10*3/uL (ref 0.7–4.0)
MCH: 29.6 pg (ref 26.0–34.0)
MCHC: 32.5 g/dL (ref 30.0–36.0)
MCV: 91 fL (ref 78.0–100.0)
MPV: 11.6 fL (ref 8.6–12.4)
Monocytes Absolute: 0.5 10*3/uL (ref 0.1–1.0)
Monocytes Relative: 11 % (ref 3–12)
Neutro Abs: 2.4 10*3/uL (ref 1.7–7.7)
Neutrophils Relative %: 50 % (ref 43–77)
Platelets: 202 10*3/uL (ref 150–400)
RBC: 3.79 MIL/uL — ABNORMAL LOW (ref 3.87–5.11)
RDW: 16 % — ABNORMAL HIGH (ref 11.5–15.5)
WBC: 4.7 10*3/uL (ref 4.0–10.5)

## 2014-08-28 LAB — COMPREHENSIVE METABOLIC PANEL
ALT: 11 U/L (ref 0–35)
AST: 20 U/L (ref 0–37)
Albumin: 3.7 g/dL (ref 3.5–5.2)
Alkaline Phosphatase: 60 U/L (ref 39–117)
BUN: 9 mg/dL (ref 6–23)
CALCIUM: 9 mg/dL (ref 8.4–10.5)
CHLORIDE: 105 meq/L (ref 96–112)
CO2: 24 mEq/L (ref 19–32)
CREATININE: 0.95 mg/dL (ref 0.50–1.10)
Glucose, Bld: 85 mg/dL (ref 70–99)
Potassium: 4.1 mEq/L (ref 3.5–5.3)
Sodium: 136 mEq/L (ref 135–145)
Total Bilirubin: 0.5 mg/dL (ref 0.2–1.2)
Total Protein: 6.9 g/dL (ref 6.0–8.3)

## 2014-08-28 LAB — LIPID PANEL
Cholesterol: 186 mg/dL (ref 0–200)
HDL: 53 mg/dL (ref >=46)
LDL CALC: 122 mg/dL — AB (ref 0–99)
TRIGLYCERIDES: 57 mg/dL (ref <150)
Total CHOL/HDL Ratio: 3.5 Ratio
VLDL: 11 mg/dL (ref 0–40)

## 2014-08-29 ENCOUNTER — Telehealth: Payer: Self-pay | Admitting: *Deleted

## 2014-08-29 ENCOUNTER — Encounter: Payer: Self-pay | Admitting: *Deleted

## 2014-08-29 LAB — VITAMIN D 25 HYDROXY (VIT D DEFICIENCY, FRACTURES): VIT D 25 HYDROXY: 26 ng/mL — AB (ref 30–100)

## 2014-08-29 NOTE — Telephone Encounter (Signed)
Received request from pharmacy for [place] on [address].   PA submitted.   Dx: Constipation K 59.09

## 2014-09-01 ENCOUNTER — Ambulatory Visit (INDEPENDENT_AMBULATORY_CARE_PROVIDER_SITE_OTHER): Payer: BLUE CROSS/BLUE SHIELD | Admitting: Otolaryngology

## 2014-09-01 DIAGNOSIS — J381 Polyp of vocal cord and larynx: Secondary | ICD-10-CM

## 2014-09-01 DIAGNOSIS — R490 Dysphonia: Secondary | ICD-10-CM | POA: Diagnosis not present

## 2014-09-06 ENCOUNTER — Other Ambulatory Visit: Payer: Self-pay | Admitting: *Deleted

## 2014-09-06 NOTE — Telephone Encounter (Signed)
Call placed to patient insurance to inquire about PA.   Advised that PA was cancelled since medication is covered. Was advised that patient was attempting to fill medication too soon.   Paid claim noted on 08/29/2014.

## 2014-10-13 ENCOUNTER — Ambulatory Visit (INDEPENDENT_AMBULATORY_CARE_PROVIDER_SITE_OTHER): Payer: BLUE CROSS/BLUE SHIELD | Admitting: Otolaryngology

## 2014-12-08 ENCOUNTER — Ambulatory Visit (INDEPENDENT_AMBULATORY_CARE_PROVIDER_SITE_OTHER): Payer: BLUE CROSS/BLUE SHIELD | Admitting: Otolaryngology

## 2014-12-15 ENCOUNTER — Ambulatory Visit (INDEPENDENT_AMBULATORY_CARE_PROVIDER_SITE_OTHER): Payer: BLUE CROSS/BLUE SHIELD | Admitting: Otolaryngology

## 2014-12-15 DIAGNOSIS — R490 Dysphonia: Secondary | ICD-10-CM | POA: Diagnosis not present

## 2014-12-15 DIAGNOSIS — K219 Gastro-esophageal reflux disease without esophagitis: Secondary | ICD-10-CM | POA: Diagnosis not present

## 2015-01-19 ENCOUNTER — Ambulatory Visit (INDEPENDENT_AMBULATORY_CARE_PROVIDER_SITE_OTHER): Payer: BLUE CROSS/BLUE SHIELD | Admitting: Physician Assistant

## 2015-01-19 ENCOUNTER — Encounter: Payer: Self-pay | Admitting: Physician Assistant

## 2015-01-19 VITALS — BP 118/74 | HR 68 | Temp 97.9°F | Resp 16 | Ht 62.5 in | Wt 160.0 lb

## 2015-01-19 DIAGNOSIS — N760 Acute vaginitis: Secondary | ICD-10-CM

## 2015-01-19 DIAGNOSIS — B9689 Other specified bacterial agents as the cause of diseases classified elsewhere: Secondary | ICD-10-CM

## 2015-01-19 DIAGNOSIS — A499 Bacterial infection, unspecified: Secondary | ICD-10-CM | POA: Diagnosis not present

## 2015-01-19 LAB — WET PREP FOR TRICH, YEAST, CLUE
Trich, Wet Prep: NONE SEEN
WBC, Wet Prep HPF POC: NONE SEEN
Yeast Wet Prep HPF POC: NONE SEEN

## 2015-01-19 MED ORDER — METRONIDAZOLE 500 MG PO TABS: 500.0000 mg | ORAL_TABLET | Freq: Two times a day (BID) | ORAL | Status: DC

## 2015-01-19 NOTE — Progress Notes (Signed)
    Patient ID: ROSEMARIA INABINET MRN: [id number], DOB: [birth year], 54 y.o. Date of Encounter: 01/19/2015, 4:51 PM    Chief Complaint:  Chief Complaint  Patient presents with  . Vaginal Irritation    x4 days- irregular cycle, outer labia irritation, can't tell if there is discharge     HPI: 54 y.o. year old AA female presents with above.   Says that for about 5 days now she has been having some spotting off and on. Says that last month she skipped having any menses. Says the month prior to that in June she had menses 2 times. Says prior to June her menses were regular.  Says she has also been feeling some vaginal irritation. Hasn't really noticed any discharge.     Home Meds:   Outpatient Prescriptions Prior to Visit  Medication Sig Dispense Refill  . Diclofenac 18 MG CAPS Take by mouth.    Marland Kitchen ipratropium (ATROVENT) 0.03 % nasal spray Place 1 spray into both nostrils 2 (two) times daily. 30 mL 6  . Linaclotide (LINZESS) 145 MCG CAPS capsule Take 1 capsule (145 mcg total) by mouth daily. 30 capsule 1  . Multiple Vitamin (MULTIVITAMIN) tablet Take 1 tablet by mouth daily.    Marland Kitchen omeprazole (PRILOSEC) 20 MG capsule Take 1 capsule (20 mg total) by mouth 2 (two) times daily before a meal. 60 capsule 3  . polyethylene glycol-electrolytes (TRILYTE) 420 G solution Take 4,000 mLs by mouth as directed. 4000 mL 0   No facility-administered medications prior to visit.    Allergies: No Known Allergies    Review of Systems: See HPI for pertinent ROS. All other ROS negative.    Physical Exam: Blood pressure 118/74, pulse 68, temperature 97.9 F (36.6 C), temperature source Oral, resp. rate 16, height 5' 2.5" (1.588 m), weight 160 lb (72.576 kg), last menstrual period 01/12/2015., Body mass index is 28.78 kg/(m^2). General:  WNWD AAF. Appears in no acute distress. Neck: Supple. No thyromegaly. No lymphadenopathy. Lungs: Clear bilaterally to auscultation without wheezes, rales, or rhonchi.  Breathing is unlabored. Heart: Regular rhythm. No murmurs, rubs, or gallops. Msk:  Strength and tone normal for age. Pelvic Exam: External genitalia normal. Vaginal mucosa normal. Cervix appears normal. No significant discharge present on inspection. Bimanual exam normal with no cervical motion tenderness no mass. Extremities/Skin: Warm and dry.  Neuro: Alert and oriented X 3. Moves all extremities spontaneously. Gait is normal. CNII-XII grossly in tact. Psych:  Responds to questions appropriately with a normal affect.     ASSESSMENT AND PLAN:  54 y.o. year old female with  1. Vaginitis and vulvovaginitis - GC/Chlamydia Probe Amp - WET PREP FOR TRICH, YEAST, CLUE  2. Bacterial vaginosis - metroNIDAZOLE (FLAGYL) 500 MG tablet; Take 1 tablet (500 mg total) by mouth 2 (two) times daily.  Dispense: 14 tablet; Refill: 0  Discussed that her irregular menstrual bleeding is most likely secondary to Peri-Menopause. Told her to continue to monitor. Follow-up if needed.   [address] [place], [place], [facility] 01/19/2015 4:51 PM

## 2015-01-20 LAB — GC/CHLAMYDIA PROBE AMP
CT Probe RNA: NEGATIVE
GC PROBE AMP APTIMA: NEGATIVE

## 2015-04-18 ENCOUNTER — Other Ambulatory Visit: Payer: Self-pay | Admitting: Family Medicine

## 2015-04-18 DIAGNOSIS — Z1231 Encounter for screening mammogram for malignant neoplasm of breast: Secondary | ICD-10-CM

## 2015-05-08 ENCOUNTER — Ambulatory Visit (HOSPITAL_COMMUNITY): Payer: BLUE CROSS/BLUE SHIELD

## 2015-05-15 ENCOUNTER — Ambulatory Visit (HOSPITAL_COMMUNITY): Payer: BLUE CROSS/BLUE SHIELD

## 2015-05-15 ENCOUNTER — Ambulatory Visit (HOSPITAL_COMMUNITY)
Admission: RE | Admit: 2015-05-15 | Discharge: 2015-05-15 | Disposition: A | Discharge status: Home / Self Care | Payer: BLUE CROSS/BLUE SHIELD | Source: Ambulatory Visit | Attending: Family Medicine | Admitting: Family Medicine

## 2015-05-15 DIAGNOSIS — Z1231 Encounter for screening mammogram for malignant neoplasm of breast: Secondary | ICD-10-CM | POA: Insufficient documentation

## 2015-06-29 ENCOUNTER — Ambulatory Visit (INDEPENDENT_AMBULATORY_CARE_PROVIDER_SITE_OTHER): Payer: BLUE CROSS/BLUE SHIELD | Admitting: Otolaryngology

## 2015-09-06 ENCOUNTER — Ambulatory Visit (INDEPENDENT_AMBULATORY_CARE_PROVIDER_SITE_OTHER): Payer: BLUE CROSS/BLUE SHIELD | Admitting: Family Medicine

## 2015-09-06 ENCOUNTER — Encounter: Payer: Self-pay | Admitting: Family Medicine

## 2015-09-06 VITALS — BP 110/62 | HR 64 | Temp 98.2°F | Resp 16 | Ht 62.5 in | Wt 156.0 lb

## 2015-09-06 DIAGNOSIS — B353 Tinea pedis: Secondary | ICD-10-CM | POA: Diagnosis not present

## 2015-09-06 DIAGNOSIS — Z0000 Encounter for general adult medical examination without abnormal findings: Secondary | ICD-10-CM

## 2015-09-06 DIAGNOSIS — M25441 Effusion, right hand: Secondary | ICD-10-CM | POA: Diagnosis not present

## 2015-09-06 DIAGNOSIS — Z79899 Other long term (current) drug therapy: Secondary | ICD-10-CM

## 2015-09-06 DIAGNOSIS — E559 Vitamin D deficiency, unspecified: Secondary | ICD-10-CM

## 2015-09-06 MED ORDER — TERBINAFINE HCL 1 % EX CREA: 1.0000 "application " | TOPICAL_CREAM | Freq: Two times a day (BID) | CUTANEOUS | Status: DC

## 2015-09-06 MED ORDER — OMEPRAZOLE 20 MG PO CPDR: 20.0000 mg | DELAYED_RELEASE_CAPSULE | Freq: Two times a day (BID) | ORAL | Status: DC

## 2015-09-06 MED ORDER — TERBINAFINE HCL 250 MG PO TABS: 250.0000 mg | ORAL_TABLET | Freq: Every day | ORAL | Status: DC

## 2015-09-06 NOTE — Progress Notes (Signed)
Patient ID: JESSIAH CAPPARELLI, female   DOB: [birth year], 55 y.o.   MRN: [id number]   Subjective:    Patient ID: [name], female    DOB: 1960-11-09, 55 y.o.   MRN: [id number]  Patient presents for R Finger Pain and R 4th Toe Pain Patient here with right finger pain she has some swelling of the MIP that has been present for the past couple weeks. She is unable to make a complete fist. She denies any particular injury but does a lot of repetitive work with her hands. She is not using anything over-the-counter.  She is also concerned about a rash on her feet for 1 month She has some sloughing skin on the sides of both feet on her right foot between the fourth and fifth digits she had a hard knot came out and now the skin has been sloughing off in between it is also very moist. She does not wear steel toe boots. She tries to change her socks and keep her feet dry. She did try an antifungal over-the-counter for a couple days when she noticed it did not work she switched to cortisone and then some type of callus cream   She has physical coming up, needs labs done    Review Of Systems:  GEN- denies fatigue, fever, weight loss,weakness, recent illness HEENT- denies eye drainage, change in vision, nasal discharge, CVS- denies chest pain, palpitations RESP- denies SOB, cough, wheeze ABD- denies N/V, change in stools, abd pain GU- denies dysuria, hematuria, dribbling, incontinence MSK- denies joint pain, muscle aches, injury Neuro- denies headache, dizziness, syncope, seizure activity       Objective:    BP 110/62 mmHg  Pulse 64  Temp(Src) 98.2 F (36.8 C) (Oral)  Resp 16  Ht 5' 2.5" (1.588 m)  Wt 156 lb (70.761 kg)  BMI 28.06 kg/m2  LMP 07/25/2015 GEN- NAD, alert and oriented x3 MSK- Right index finger swelling of MIP, unable to flex completey, normal extension, NT Normal ROM other fingers Skin- Right foot- maceration and ulcerated skin between 4-5th , 3rd and 4th digit, mild TTP, no  odor, scaley dry skin on latearl aspect both feet        Assessment & Plan:      Problem List Items Addressed This Visit    None    Visit Diagnoses    Tinea pedis of both feet    -  Primary    Resistant tinea which really terbinafine will check liver function test have also given her antifungal cream to use and to use Gold [employer]s powder    Relevant Medications    terbinafine (LAMISIL) 250 MG tablet    terbinafine (LAMISIL) 1 % cream    Other Relevant Orders    Comprehensive metabolic panel (Completed)    Long-term use of high-risk medication        Check LFT due to terbinafine for baseline    Relevant Orders    Comprehensive metabolic panel (Completed)    Vitamin D deficiency        Relevant Orders    Vitamin D, 25-hydroxy    Routine general medical examination at a health care facility        Relevant Orders    CBC with Differential/Platelet    Comprehensive metabolic panel    Lipid panel    TSH    Vitamin D deficiency   (Active)      Relevant Orders    Vitamin D, 25-hydroxy  Finger joint swelling, right        xray of finger, likely OA, no particular injury    Relevant Orders    DG Finger Index Right       Note: This dictation was prepared with Dragon dictation along with smaller phrase technology. Any transcriptional errors that result from this process are unintentional.

## 2015-09-06 NOTE — Patient Instructions (Addendum)
Get the xray of your finger Use the cream on your feet Use gold bonds powder  Take pill by mouth for 2 weeks F/U as previous for physical

## 2015-09-07 LAB — COMPREHENSIVE METABOLIC PANEL
ALT: 8 U/L (ref 6–29)
AST: 19 U/L (ref 10–35)
Albumin: 3.9 g/dL (ref 3.6–5.1)
Alkaline Phosphatase: 62 U/L (ref 33–130)
BUN: 14 mg/dL (ref 7–25)
CO2: 24 mmol/L (ref 20–31)
Calcium: 8.9 mg/dL (ref 8.6–10.4)
Chloride: 106 mmol/L (ref 98–110)
Creat: 0.77 mg/dL (ref 0.50–1.05)
GLUCOSE: 86 mg/dL (ref 70–99)
POTASSIUM: 3.9 mmol/L (ref 3.5–5.3)
Sodium: 136 mmol/L (ref 135–146)
Total Bilirubin: 0.7 mg/dL (ref 0.2–1.2)
Total Protein: 6.9 g/dL (ref 6.1–8.1)

## 2015-09-08 ENCOUNTER — Encounter: Payer: Self-pay | Admitting: Family Medicine

## 2015-09-28 ENCOUNTER — Ambulatory Visit (HOSPITAL_COMMUNITY)
Admission: RE | Admit: 2015-09-28 | Discharge: 2015-09-28 | Disposition: A | Discharge status: Home / Self Care | Payer: BLUE CROSS/BLUE SHIELD | Source: Ambulatory Visit | Attending: Family Medicine | Admitting: Family Medicine

## 2015-09-28 DIAGNOSIS — M79644 Pain in right finger(s): Secondary | ICD-10-CM | POA: Diagnosis not present

## 2015-09-28 DIAGNOSIS — M25441 Effusion, right hand: Secondary | ICD-10-CM | POA: Diagnosis not present

## 2015-09-28 DIAGNOSIS — M7989 Other specified soft tissue disorders: Secondary | ICD-10-CM | POA: Diagnosis not present

## 2015-10-02 ENCOUNTER — Encounter: Payer: Self-pay | Admitting: Family Medicine

## 2015-10-02 ENCOUNTER — Ambulatory Visit (INDEPENDENT_AMBULATORY_CARE_PROVIDER_SITE_OTHER): Payer: BLUE CROSS/BLUE SHIELD | Admitting: Family Medicine

## 2015-10-02 VITALS — BP 126/70 | HR 66 | Temp 98.6°F | Resp 14 | Ht 63.0 in | Wt 159.0 lb

## 2015-10-02 DIAGNOSIS — Z113 Encounter for screening for infections with a predominantly sexual mode of transmission: Secondary | ICD-10-CM

## 2015-10-02 DIAGNOSIS — Z124 Encounter for screening for malignant neoplasm of cervix: Secondary | ICD-10-CM | POA: Diagnosis not present

## 2015-10-02 DIAGNOSIS — Z1159 Encounter for screening for other viral diseases: Secondary | ICD-10-CM | POA: Diagnosis not present

## 2015-10-02 DIAGNOSIS — N939 Abnormal uterine and vaginal bleeding, unspecified: Secondary | ICD-10-CM

## 2015-10-02 DIAGNOSIS — IMO0002 Reserved for concepts with insufficient information to code with codable children: Secondary | ICD-10-CM

## 2015-10-02 DIAGNOSIS — Z0000 Encounter for general adult medical examination without abnormal findings: Secondary | ICD-10-CM

## 2015-10-02 DIAGNOSIS — E559 Vitamin D deficiency, unspecified: Secondary | ICD-10-CM | POA: Diagnosis not present

## 2015-10-02 DIAGNOSIS — R896 Abnormal cytological findings in specimens from other organs, systems and tissues: Secondary | ICD-10-CM | POA: Diagnosis not present

## 2015-10-02 LAB — WET PREP FOR TRICH, YEAST, CLUE
Clue Cells Wet Prep HPF POC: NONE SEEN
Trich, Wet Prep: NONE SEEN
YEAST WET PREP: NONE SEEN

## 2015-10-02 NOTE — Patient Instructions (Addendum)
I recommend eye visit once a year I recommend dental visit every 6 months Goal is to  Exercise 30 minutes 5 days a week We will send a letter with lab results  Use Aspercreme or Biofreeze to her finger If not improved referral to hand specialist TDAP given  F/U 1 year or as needed

## 2015-10-02 NOTE — Progress Notes (Signed)
Patient ID: Jacqueline [name], female   DOB: 10-Jan-1961, 55 y.o.   MRN: [id number]    Subjective:    Patient ID: Jacqueline [name], female    DOB: 11-25-1960, 55 y.o.   MRN: [id number]  Patient presents for CPE Here for complete physical exam she had a Pap smear done last year which showed ASCUS but no HPV. Her mammogram is up-to-date colonoscopy up-to-date. She is due for her fasting labs though she did eat a few hours ago.  She's not had a problems with cholesterol.  She is due for a tetanus booster  She continues to have abnormal menstrual cycle she will skip a couple months and then have another cycle. She occasionally has hot flashes.  Review Of Systems:  GEN- denies fatigue, fever, weight loss,weakness, recent illness HEENT- denies eye drainage, change in vision, nasal discharge, CVS- denies chest pain, palpitations RESP- denies SOB, cough, wheeze ABD- denies N/V, change in stools, abd pain GU- denies dysuria, hematuria, dribbling, incontinence MSK- denies joint pain, muscle aches, injury Neuro- denies headache, dizziness, syncope, seizure activity       Objective:    BP 126/70 mmHg  Pulse 66  Temp(Src) 98.6 F (37 C) (Oral)  Resp 14  Ht 5\' 3"  (1.6 m)  Wt 159 lb (72.122 kg)  BMI 28.17 kg/m2  LMP 09/04/2015 (Approximate) GEN- NAD, alert and oriented x3 HEENT- PERRL, EOMI, non injected sclera, pink conjunctiva, MMM, oropharynx clear Neck- Supple, no thyromegaly Breast- normal symmetry, no nipple inversion,no nipple drainage, no nodules or lumps felt Nodes- no axillary nodes CVS- RRR, no murmur RESP-CTAB ABD-NABS,soft,NT,ND GU- normal external genitalia, vaginal mucosa pink and moist, cervix visualized no growth, no blood form os, minimal thin clear discharge, no CMT, no ovarian masses, uterus normal size Ext- No edema Pulses- Radial, DP- 2+        Assessment & Plan:      Problem List Items Addressed This Visit    None    Visit Diagnoses    Need for hepatitis C  screening test    -  Primary    Relevant Orders    Hepatitis C antibody    Pap smear for cervical cancer screening        Relevant Orders    PAP, ThinPrep ASCUS Rflx HPV Rflx Type    ASCUS favor benign        Relevant Orders    PAP, ThinPrep ASCUS Rflx HPV Rflx Type    Screen for STD (sexually transmitted disease)        Relevant Orders    WET PREP FOR [place], YEAST, CLUE (Completed)    GC/Chlamydia Probe Amp    Abnormal uterine bleeding        Relevant Orders    FSH/LH    Routine general medical examination at a health care facility        CPE done, TDAP Given, STD screen, PAP Smear history of ASCUS, hep C screening due to birthdate       Note: This dictation was prepared with Dragon dictation along with smaller phrase technology. Any transcriptional errors that result from this process are unintentional.

## 2015-10-03 LAB — CBC WITH DIFFERENTIAL/PLATELET
BASOS PCT: 1 %
Basophils Absolute: 49 cells/uL (ref 0–200)
EOS PCT: 2 %
Eosinophils Absolute: 98 cells/uL (ref 15–500)
HCT: 34.5 % — ABNORMAL LOW (ref 35.0–45.0)
Hemoglobin: 11.2 g/dL — ABNORMAL LOW (ref 12.0–15.0)
LYMPHS ABS: 2009 {cells}/uL (ref 850–3900)
LYMPHS PCT: 41 %
MCH: 30.3 pg (ref 27.0–33.0)
MCHC: 32.5 g/dL (ref 32.0–36.0)
MCV: 93.2 fL (ref 80.0–100.0)
MPV: 11.9 fL (ref 7.5–12.5)
Monocytes Absolute: 539 cells/uL (ref 200–950)
Monocytes Relative: 11 %
Neutro Abs: 2205 cells/uL (ref 1500–7800)
Neutrophils Relative %: 45 %
Platelets: 216 10*3/uL (ref 140–400)
RBC: 3.7 MIL/uL — AB (ref 3.80–5.10)
RDW: 14 % (ref 11.0–15.0)
WBC: 4.9 10*3/uL (ref 3.8–10.8)

## 2015-10-03 LAB — COMPREHENSIVE METABOLIC PANEL
ALT: 10 U/L (ref 6–29)
AST: 18 U/L (ref 10–35)
Albumin: 4 g/dL (ref 3.6–5.1)
Alkaline Phosphatase: 73 U/L (ref 33–130)
BILIRUBIN TOTAL: 0.4 mg/dL (ref 0.2–1.2)
BUN: 11 mg/dL (ref 7–25)
CO2: 25 mmol/L (ref 20–31)
CREATININE: 0.9 mg/dL (ref 0.50–1.05)
Calcium: 9.2 mg/dL (ref 8.6–10.4)
Chloride: 105 mmol/L (ref 98–110)
GLUCOSE: 81 mg/dL (ref 70–99)
Potassium: 4.4 mmol/L (ref 3.5–5.3)
SODIUM: 137 mmol/L (ref 135–146)
Total Protein: 7 g/dL (ref 6.1–8.1)

## 2015-10-03 LAB — LIPID PANEL
Cholesterol: 189 mg/dL (ref 125–200)
HDL: 55 mg/dL (ref >=46)
LDL CALC: 112 mg/dL (ref <130)
Total CHOL/HDL Ratio: 3.4 Ratio (ref <=5.0)
Triglycerides: 108 mg/dL (ref <150)
VLDL: 22 mg/dL (ref <30)

## 2015-10-03 LAB — VITAMIN D 25 HYDROXY (VIT D DEFICIENCY, FRACTURES): Vit D, 25-Hydroxy: 16 ng/mL — ABNORMAL LOW (ref 30–100)

## 2015-10-03 LAB — GC/CHLAMYDIA PROBE AMP
CT PROBE, AMP APTIMA: NOT DETECTED
GC Probe RNA: NOT DETECTED

## 2015-10-03 LAB — TSH: TSH: 0.86 m[IU]/L

## 2015-10-04 LAB — FSH/LH
FSH: 3.5 m[IU]/mL
LH: 4.1 m[IU]/mL

## 2015-10-04 LAB — PAP THINPREP ASCUS RFLX HPV RFLX TYPE

## 2015-10-04 LAB — HEPATITIS C ANTIBODY: HCV Ab: NEGATIVE

## 2015-10-05 ENCOUNTER — Other Ambulatory Visit: Payer: Self-pay | Admitting: *Deleted

## 2015-10-05 ENCOUNTER — Encounter: Payer: Self-pay | Admitting: *Deleted

## 2015-10-05 MED ORDER — VITAMIN D (ERGOCALCIFEROL) 1.25 MG (50000 UNIT) PO CAPS: ORAL_CAPSULE | ORAL | Status: DC

## 2015-11-14 ENCOUNTER — Ambulatory Visit (INDEPENDENT_AMBULATORY_CARE_PROVIDER_SITE_OTHER): Payer: BLUE CROSS/BLUE SHIELD | Admitting: Family Medicine

## 2015-11-14 ENCOUNTER — Encounter: Payer: Self-pay | Admitting: Family Medicine

## 2015-11-14 VITALS — BP 138/70 | HR 78 | Temp 97.9°F | Resp 14 | Ht 63.0 in | Wt 160.0 lb

## 2015-11-14 DIAGNOSIS — S39012A Strain of muscle, fascia and tendon of lower back, initial encounter: Secondary | ICD-10-CM | POA: Diagnosis not present

## 2015-11-14 DIAGNOSIS — L259 Unspecified contact dermatitis, unspecified cause: Secondary | ICD-10-CM | POA: Diagnosis not present

## 2015-11-14 DIAGNOSIS — L84 Corns and callosities: Secondary | ICD-10-CM | POA: Diagnosis not present

## 2015-11-14 DIAGNOSIS — L850 Acquired ichthyosis: Secondary | ICD-10-CM

## 2015-11-14 DIAGNOSIS — L853 Xerosis cutis: Secondary | ICD-10-CM

## 2015-11-14 MED ORDER — CYCLOBENZAPRINE HCL 10 MG PO TABS: 10.0000 mg | ORAL_TABLET | Freq: Three times a day (TID) | ORAL | Status: DC | PRN

## 2015-11-14 MED ORDER — TRIAMCINOLONE ACETONIDE 0.1 % EX CREA: 1.0000 "application " | TOPICAL_CREAM | Freq: Two times a day (BID) | CUTANEOUS | Status: DC

## 2015-11-14 MED ORDER — UREA 10 % EX CREA: TOPICAL_CREAM | CUTANEOUS | Status: DC | PRN

## 2015-11-14 MED ORDER — MELOXICAM 7.5 MG PO TABS: 7.5000 mg | ORAL_TABLET | Freq: Every day | ORAL | Status: DC

## 2015-11-14 NOTE — Patient Instructions (Signed)
For back- use flexeril, meloxicam, use heating patch  Urea cream for your feet  Triamcinolone steroid for your neck  F/U as needed

## 2015-11-15 ENCOUNTER — Encounter: Payer: Self-pay | Admitting: Family Medicine

## 2015-11-15 NOTE — Progress Notes (Signed)
Patient ID: Jacqueline [name], female   DOB: 1961-02-09, 55 y.o.   MRN: [id number]    Subjective:    Patient ID: Jacqueline [name], female    DOB: 03-Jul-1960, 55 y.o.   MRN: [id number]  Patient presents for Back Pain; Joint Pain; and Skin Irritation Patient with multiple concerns. She still worried about the appearance of her feet we have cleared up more so the fungus but she still has a lot of dry skin with flaking and she wants something to use on this. She also has a spot between her fourth and fifth digits she initially had some maceration there from the fungus now this seems to be some rubbing and she has a callus that is tender.  Right low back pain for the past few days states that she did not have any particular injury just got out of the bed and felt the pain. She's been using a heating pad and Biofreeze which is helped is nonradiating no change of bowel bladder  The kidneys have pain in her right hand index finger with x-rays done which were negative she has been using topical Biofreeze that is not help. 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  He has a rash on her neck present for the past week she used a new perfume she's been trying some over-the-counter anti-itch spray for poison ivy which is helped a little bit.    Review Of Systems: per above   GEN- denies fatigue, fever, weight loss,weakness, recent illness HEENT- denies eye drainage, change in vision, nasal discharge, CVS- denies chest pain, palpitations RESP- denies SOB, cough, wheeze ABD- denies N/V, change in stools, abd pain GU- denies dysuria, hematuria, dribbling, incontinence MSK- + joint pain, muscle aches, injury Neuro- denies headache, dizziness, syncope, seizure activity       Objective:    BP 138/70 mmHg  Pulse 78  Temp(Src) 97.9 F (36.6 C) (Oral)  Resp 14  Ht 5\' 3"  (1.6 m)  Wt 160 lb  (72.576 kg)  BMI 28.35 kg/m2  LMP 10/23/2015 GEN- NAD, alert and oriented x3 HEENT- PERRL, EOMI, non injected sclera, pink conjunctiva, MMM, oropharynx clear Neck- Supple, no LAD  Skin-  Hive like rash around neck, dry flaky skin on bilat feet, callus on right 4th and 5th digit, mild erythema - rubbing  MSK- Right index finger swelling of MIP, unable to flex completey, normal extension, NT Normal ROM other fingers Spine NT, TTP Right paraspinals, neg SLR, fair ROM, strength equal bilat LE Neuro- sensation grossly in tact, normal tone LE  EXT- No edema Pulses- Radial, 2+        Assessment & Plan:      Problem List Items Addressed This Visit    None    Visit Diagnoses    Low back strain, initial encounter    -  Primary    Mobic, flexeril, heat to area, no red flags, can also use mobic for her finger.- referral tohand surgeon if not improved     Corn or callus        Use OTC corn/callus medication and pad between toes to keep from rubbing    Dry skin dermatitis        SHe is very concerned about the feet, trial of urea cream     Contact dermatitis  TAC to be applied to neck, allergic reaction new perfume       Note: This dictation was prepared with Dragon dictation along with smaller phrase technology. Any transcriptional errors that result from this process are unintentional.

## 2015-12-18 DIAGNOSIS — J309 Allergic rhinitis, unspecified: Secondary | ICD-10-CM | POA: Diagnosis not present

## 2015-12-18 DIAGNOSIS — A6000 Herpesviral infection of urogenital system, unspecified: Secondary | ICD-10-CM | POA: Diagnosis not present

## 2016-02-23 ENCOUNTER — Telehealth: Payer: Self-pay | Admitting: Family Medicine

## 2016-02-23 DIAGNOSIS — L84 Corns and callosities: Secondary | ICD-10-CM

## 2016-02-23 DIAGNOSIS — M79672 Pain in left foot: Secondary | ICD-10-CM

## 2016-02-23 DIAGNOSIS — M79671 Pain in right foot: Secondary | ICD-10-CM

## 2016-02-23 NOTE — Telephone Encounter (Signed)
Agree 

## 2016-02-23 NOTE — Telephone Encounter (Signed)
Referral placed and faxed to [facility] in [place].

## 2016-02-23 NOTE — Telephone Encounter (Signed)
Patient is calling to get a referral to a foot doc in [place] if possible she has been here twice for her toe issues

## 2016-03-05 DIAGNOSIS — M79671 Pain in right foot: Secondary | ICD-10-CM | POA: Diagnosis not present

## 2016-03-05 DIAGNOSIS — L851 Acquired keratosis [keratoderma] palmaris et plantaris: Secondary | ICD-10-CM | POA: Diagnosis not present

## 2016-03-29 ENCOUNTER — Encounter: Payer: Self-pay | Admitting: Family Medicine

## 2016-03-29 ENCOUNTER — Ambulatory Visit (INDEPENDENT_AMBULATORY_CARE_PROVIDER_SITE_OTHER): Payer: BLUE CROSS/BLUE SHIELD | Admitting: Family Medicine

## 2016-03-29 VITALS — BP 120/66 | HR 84 | Temp 98.2°F | Resp 12 | Ht 63.0 in | Wt 155.0 lb

## 2016-03-29 DIAGNOSIS — M6530 Trigger finger, unspecified finger: Secondary | ICD-10-CM | POA: Diagnosis not present

## 2016-03-29 DIAGNOSIS — B354 Tinea corporis: Secondary | ICD-10-CM

## 2016-03-29 DIAGNOSIS — Z23 Encounter for immunization: Secondary | ICD-10-CM | POA: Diagnosis not present

## 2016-03-29 MED ORDER — UREA 10 % EX CREA: TOPICAL_CREAM | CUTANEOUS | 0 refills | Status: DC | PRN

## 2016-03-29 MED ORDER — CLOTRIMAZOLE-BETAMETHASONE 1-0.05 % EX CREA: 1.0000 "application " | TOPICAL_CREAM | Freq: Two times a day (BID) | CUTANEOUS | 0 refills | Status: DC

## 2016-03-29 NOTE — Progress Notes (Signed)
   Subjective:    Patient ID: Jacqueline [name], female    DOB: 15-Nov-1960, 55 y.o.   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 She will like to have this evaluated. She is right-handed and does a lot manually with her hands at work. She feels soreness in her joints especially in the morning and gets swelling at the MIP.  She also noticed a rash to her right inner thigh she is not sure how long this been present but she did use some apple cider vendor on it. She also has 2 hyperpigmented spots became up after she shaved she usually only uses water and alcohol to shape with.  Has some cuts on arms from the wire she uses at work, TDAP is overdue   Review Of Systems:  GEN- denies fatigue, fever, weight loss,weakness, recent illness HEENT- denies eye drainage, change in vision, nasal discharge, CVS- denies chest pain, palpitations RESP- denies SOB, cough, wheeze ABD- denies N/V, change in stools, abd pain GU- denies dysuria, hematuria, dribbling, incontinence MSK- + joint pain, muscle aches, injury Neuro- denies headache, dizziness, syncope, seizure activity       Objective:    BP 120/66 (BP Location: Left Arm, Patient Position: Sitting, Cuff Size: Normal)   Pulse 84   Temp 98.2 F (36.8 C) (Oral)   Resp 12   Ht 5\' 3"  (1.6 m)   Wt 155 lb (70.3 kg)   BMI 27.46 kg/m  GEN- NAD, alert and oriented x3 Skin- erythematous rasised midly scaley circular lesion on right inner thigh, NT, left vulva hyperpigemented scar/scab no fluctance Bilat forearms-few scattered  linear cuts/abrasions  MSK- bilat hands no nodules palpated, able to make fist,   Right index and middle  finger swelling of MIP, mild triggering of  index finger , normal extension, NT Normal ROM other fingers  Pulses- Radial 2+        Assessment & Plan:      Problem List Items Addressed This Visit    None    Visit Diagnoses    Trigger finger, acquired    -  Primary   Referral to hand surgeon   Tinea corporis       Topical lotrisone cream for rash, appears to be fungual, hyperpigmented scarring over shaved area of vulva   Relevant Medications   clotrimazole-betamethasone (LOTRISONE) cream   Need for prophylactic vaccination with combined diphtheria-tetanus-pertussis (DTP) vaccine       Cuts from wires , which is part of job needs TDAP   Relevant Orders   Tdap vaccine greater than or equal to 7yo IM (Completed)      Note: This dictation was prepared with Dragon dictation along with smaller Company secretary. Any transcriptional errors that result from this process are unintentional.

## 2016-03-29 NOTE — Patient Instructions (Signed)
Referral to hand surgeon Tetanus Booster  F/U as needed

## 2016-03-31 ENCOUNTER — Encounter: Payer: Self-pay | Admitting: Family Medicine

## 2016-04-02 ENCOUNTER — Telehealth: Payer: Self-pay | Admitting: *Deleted

## 2016-04-02 MED ORDER — UREA 20 % EX CREA: TOPICAL_CREAM | CUTANEOUS | 0 refills | Status: DC | PRN

## 2016-04-02 NOTE — Telephone Encounter (Signed)
Okay to change to 20%

## 2016-04-02 NOTE — Telephone Encounter (Signed)
Prescription sent to pharmacy.

## 2016-04-02 NOTE — Telephone Encounter (Signed)
Received fax from pharmacy.   Reports that Urea 10% cream is not available. Requested to change to Urea 20% cream.   MD please advise.

## 2016-04-17 ENCOUNTER — Other Ambulatory Visit: Payer: Self-pay | Admitting: Family Medicine

## 2016-04-17 DIAGNOSIS — M6530 Trigger finger, unspecified finger: Secondary | ICD-10-CM

## 2016-04-24 DIAGNOSIS — M65331 Trigger finger, right middle finger: Secondary | ICD-10-CM | POA: Diagnosis not present

## 2016-04-24 DIAGNOSIS — M79644 Pain in right finger(s): Secondary | ICD-10-CM | POA: Diagnosis not present

## 2016-05-07 ENCOUNTER — Ambulatory Visit: Payer: BLUE CROSS/BLUE SHIELD | Admitting: Family Medicine

## 2016-05-08 ENCOUNTER — Other Ambulatory Visit: Payer: Self-pay | Admitting: Family Medicine

## 2016-05-08 DIAGNOSIS — M65331 Trigger finger, right middle finger: Secondary | ICD-10-CM | POA: Diagnosis not present

## 2016-05-08 DIAGNOSIS — Z1231 Encounter for screening mammogram for malignant neoplasm of breast: Secondary | ICD-10-CM

## 2016-05-23 ENCOUNTER — Encounter (HOSPITAL_COMMUNITY): Payer: Self-pay | Admitting: Radiology

## 2016-05-23 ENCOUNTER — Ambulatory Visit (HOSPITAL_COMMUNITY)
Admission: RE | Admit: 2016-05-23 | Discharge: 2016-05-23 | Disposition: A | Discharge status: Home / Self Care | Payer: BLUE CROSS/BLUE SHIELD | Source: Ambulatory Visit | Attending: Family Medicine | Admitting: Family Medicine

## 2016-05-23 DIAGNOSIS — Z1231 Encounter for screening mammogram for malignant neoplasm of breast: Secondary | ICD-10-CM | POA: Diagnosis not present

## 2016-05-24 ENCOUNTER — Ambulatory Visit (HOSPITAL_COMMUNITY): Payer: BLUE CROSS/BLUE SHIELD

## 2016-06-07 DIAGNOSIS — M65321 Trigger finger, right index finger: Secondary | ICD-10-CM | POA: Diagnosis not present

## 2016-06-07 DIAGNOSIS — M65331 Trigger finger, right middle finger: Secondary | ICD-10-CM | POA: Diagnosis not present

## 2016-06-10 DIAGNOSIS — T8859XA Other complications of anesthesia, initial encounter: Secondary | ICD-10-CM

## 2016-06-10 HISTORY — DX: Other complications of anesthesia, initial encounter: T88.59XA

## 2016-07-10 DIAGNOSIS — M65321 Trigger finger, right index finger: Secondary | ICD-10-CM | POA: Diagnosis not present

## 2016-07-10 DIAGNOSIS — M65331 Trigger finger, right middle finger: Secondary | ICD-10-CM | POA: Diagnosis not present

## 2016-08-30 ENCOUNTER — Encounter: Payer: Self-pay | Admitting: Family Medicine

## 2016-08-30 ENCOUNTER — Ambulatory Visit (INDEPENDENT_AMBULATORY_CARE_PROVIDER_SITE_OTHER): Payer: BLUE CROSS/BLUE SHIELD | Admitting: Family Medicine

## 2016-08-30 VITALS — BP 128/78 | HR 76 | Temp 98.1°F | Resp 14 | Ht 63.0 in | Wt 174.0 lb

## 2016-08-30 DIAGNOSIS — M5136 Other intervertebral disc degeneration, lumbar region: Secondary | ICD-10-CM

## 2016-08-30 DIAGNOSIS — W19XXXS Unspecified fall, sequela: Secondary | ICD-10-CM

## 2016-08-30 DIAGNOSIS — S39012D Strain of muscle, fascia and tendon of lower back, subsequent encounter: Secondary | ICD-10-CM | POA: Diagnosis not present

## 2016-08-30 MED ORDER — CYCLOBENZAPRINE HCL 5 MG PO TABS: 5.0000 mg | ORAL_TABLET | Freq: Three times a day (TID) | ORAL | 1 refills | Status: DC | PRN

## 2016-08-30 MED ORDER — HYDROCODONE-ACETAMINOPHEN 5-325 MG PO TABS: 1.0000 | ORAL_TABLET | Freq: Four times a day (QID) | ORAL | 0 refills | Status: DC | PRN

## 2016-08-30 NOTE — Progress Notes (Signed)
Subjective:    Patient ID: Jacqueline [name], female    DOB: 1960-09-22, 56 y.o.   MRN: [id number]  Patient presents for Lumbar Back Pain (x weeks- states that she fell at work weeks ago and has been seeing company MD- states that she was releasaed from company MD on 08/17/2016- continues to have increased pain in lower back)   Pt here with low back pain. On April 10, 2016 was at work, she was pulling a bin out with the typical stick, she squat down to help pull out it out and fell backwards on concrete floor. 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   On 11/29 placed on work restrictions for 1 week, continued on meds She was re-evaluated multiple times for her ongoing back pain. Though never taken out of work.  She was to have her physical therapy extended to total of 8 sessions but this never happened. She saw Workers Comp doctor on March 7th and Physical therapy was extended but states last week she was called and told her therpay was cancelled.  This week-Monday she got out of the car and back flared up again, feels very tight and stiff, difficulty squatting , bending, pain on both sides of lower back     Stil using back patches,and ibuprofen, and vitamins for bone health , trying home physical therapy stretches    Per pt they closed her case and told her to come to PCP though she still has ongoing pain  Review Of Systems:  GEN- denies fatigue, fever, weight loss,weakness, recent illness HEENT- denies eye drainage, change in vision, nasal discharge, CVS- denies chest pain, palpitations RESP- denies SOB, cough, wheeze ABD- denies N/V, change in stools, abd pain GU- denies dysuria, hematuria, dribbling, incontinence MSK- + joint pain, muscle aches, injury Neuro- denies  headache, dizziness, syncope, seizure activity       Objective:    BP 128/78   Pulse 76   Temp 98.1 F (36.7 C) (Oral)   Resp 14   Ht 5\' 3"  (1.6 m)   Wt 174 lb (78.9 kg)   SpO2 98%   BMI 30.82 kg/m  GEN- NAD, alert and oriented x3 Neck- Supple, good ROM MSK- Mild ttp lumbar spine, +paraspinal spasm L >R, decreased ROM, neg SLR Neuro- strength equal bilat LE, normal tone, sensation in tact, DTR symmetric  Pulses-  DP- 2+        Assessment & Plan:     Workers Comp- Janett Billow [phone]   / Physician Occupation Health [phone] Problem List Items Addressed This Visit    None    Visit Diagnoses    DDD (degenerative disc disease), lumbar    -  Primary   s/p fall, with DDD but more muscle spasm, Musculoskelteal pain. Given flexeril, norco, Mobic during day. Discuss with her workers comp, to see why she has been referred to me with her ongoing pain as a result from her injury at work.   Relevant Medications   HYDROcodone-acetaminophen (NORCO) 5-325 MG tablet   cyclobenzaprine (FLEXERIL) 5 MG tablet   Strain of lumbar region, subsequent encounter       Fall, sequela          Note: This dictation was prepared with Dragon dictation along with smaller phrase  technology. Any transcriptional errors that result from this process are unintentional.

## 2016-08-30 NOTE — Patient Instructions (Signed)
Use muscle relaxer at bedtime Take pain medicine as needed Take either Naprosyn twice a day  or Meloxicam once a day  I will call workers comp Heating pad  F/U as needed

## 2016-09-01 ENCOUNTER — Encounter: Payer: Self-pay | Admitting: Family Medicine

## 2016-09-02 ENCOUNTER — Ambulatory Visit (INDEPENDENT_AMBULATORY_CARE_PROVIDER_SITE_OTHER): Payer: BLUE CROSS/BLUE SHIELD | Admitting: Family Medicine

## 2016-09-02 ENCOUNTER — Encounter: Payer: Self-pay | Admitting: Family Medicine

## 2016-09-02 VITALS — BP 122/78 | HR 82 | Temp 98.1°F | Resp 14 | Ht 63.0 in | Wt 174.0 lb

## 2016-09-02 DIAGNOSIS — N939 Abnormal uterine and vaginal bleeding, unspecified: Secondary | ICD-10-CM

## 2016-09-02 DIAGNOSIS — N880 Leukoplakia of cervix uteri: Secondary | ICD-10-CM | POA: Diagnosis not present

## 2016-09-02 DIAGNOSIS — Z124 Encounter for screening for malignant neoplasm of cervix: Secondary | ICD-10-CM

## 2016-09-02 DIAGNOSIS — D259 Leiomyoma of uterus, unspecified: Secondary | ICD-10-CM

## 2016-09-02 DIAGNOSIS — B9689 Other specified bacterial agents as the cause of diseases classified elsewhere: Secondary | ICD-10-CM | POA: Diagnosis not present

## 2016-09-02 DIAGNOSIS — N760 Acute vaginitis: Secondary | ICD-10-CM

## 2016-09-02 LAB — WET PREP FOR TRICH, YEAST, CLUE
Trich, Wet Prep: NONE SEEN
Yeast Wet Prep HPF POC: NONE SEEN

## 2016-09-02 MED ORDER — METRONIDAZOLE 500 MG PO TABS: 500.0000 mg | ORAL_TABLET | Freq: Two times a day (BID) | ORAL | 0 refills | Status: DC

## 2016-09-02 NOTE — Progress Notes (Signed)
   Subjective:    Patient ID: Jacqueline [name], female    DOB: Mar 22, 1961, 56 y.o.   MRN: [id number]  Patient presents for Vaginal Spotting (has used tx for yeast infection)  . Vaginal spotting that started last Friday night. She states that she only notices it when she wipes there is nothing on her pad. She was concerned she may have some type of infection therefore she uses a yeast over-the-counter pill this did not help. She hash is not having discharge or odor or itching. She like to be checked for bacterial vaginosis that she is at this past as well. She denies any significant cramping. Her last mental cycle  Was  back in July 2017   Last PAP April 2017 She does have known fibroid uterus - last [place] in 2014   Review Of Systems:  GEN- denies fatigue, fever, weight loss,weakness, recent illness HEENT- denies eye drainage, change in vision, nasal discharge, CVS- denies chest pain, palpitations RESP- denies SOB, cough, wheeze ABD- denies N/V, change in stools, abd pain GU- denies dysuria, hematuria, dribbling, incontinence MSK- denies joint pain, muscle aches, injury Neuro- denies headache, dizziness, syncope, seizure activity       Objective:    BP 122/78   Pulse 82   Temp 98.1 F (36.7 C) (Oral)   Resp 14   Ht 5\' 3"  (1.6 m)   Wt 174 lb (78.9 kg)   SpO2 98%   BMI 30.82 kg/m  GEN- NAD, alert and oriented x3 ABD-NABS,soft,NT,ND GU- normal external genitalia, vaginal mucosa pink and moist, cervix visualized no growth,+ blood form os, No discharge, no CMT, no ovarian masses, uterus normal size        Assessment & Plan:      Problem List Items Addressed This Visit    Fibroid uterus    Other Visit Diagnoses    Abnormal uterine bleeding    -  Primary   Concern for abnormal bleeding at her age, she has known fibroids, recheck PAP with her bleeding in this menopausal state as well. Wet prep per bel. Refer to GYN   Relevant Orders   WET PREP FOR [place], YEAST, CLUE   Cervical  cancer screening       Relevant Orders   PAP, ThinPrep ASCUS Rflx HPV Rflx Type   BV (bacterial vaginosis)       Mild infection, but with bleeding unknown cause, will give flagyl   Relevant Medications   metroNIDAZOLE (FLAGYL) 500 MG tablet      Note: This dictation was prepared with Dragon dictation along with smaller phrase technology. Any transcriptional errors that result from this process are unintentional.

## 2016-09-02 NOTE — Patient Instructions (Addendum)
Referral to GYN for your bleeding and fibroids  Take the flagyl as prescribed  F/U as needed

## 2016-09-04 LAB — PAP THINPREP ASCUS RFLX HPV RFLX TYPE

## 2016-09-05 ENCOUNTER — Encounter: Payer: Self-pay | Admitting: *Deleted

## 2016-09-09 ENCOUNTER — Telehealth: Payer: Self-pay | Admitting: Family Medicine

## 2016-09-09 NOTE — Telephone Encounter (Signed)
Pt called wanting to let doctor [facility] know that her W/C doctor at occupational therapy in [place] said that there is nothing wrong with her and released her to go back to work, wanted to let us know.

## 2016-09-09 NOTE — Telephone Encounter (Signed)
Call placed to patient and patient made aware.  

## 2016-09-09 NOTE — Telephone Encounter (Signed)
She can complete the muscle relaxer/anti-inflammatories as prescribed on 3/23 return for visit if this problem persist, but will NOT be under workers comp

## 2016-09-09 NOTE — Telephone Encounter (Signed)
To MD

## 2016-09-10 ENCOUNTER — Encounter: Payer: Self-pay | Admitting: Family Medicine

## 2016-09-12 ENCOUNTER — Encounter: Payer: Self-pay | Admitting: *Deleted

## 2016-10-03 ENCOUNTER — Encounter: Payer: Self-pay | Admitting: Obstetrics & Gynecology

## 2016-10-03 ENCOUNTER — Ambulatory Visit (INDEPENDENT_AMBULATORY_CARE_PROVIDER_SITE_OTHER): Payer: BLUE CROSS/BLUE SHIELD | Admitting: Obstetrics & Gynecology

## 2016-10-03 VITALS — BP 140/80 | HR 77 | Wt 170.0 lb

## 2016-10-03 DIAGNOSIS — N924 Excessive bleeding in the premenopausal period: Secondary | ICD-10-CM

## 2016-10-03 DIAGNOSIS — D251 Intramural leiomyoma of uterus: Secondary | ICD-10-CM | POA: Diagnosis not present

## 2016-10-03 DIAGNOSIS — D252 Subserosal leiomyoma of uterus: Secondary | ICD-10-CM | POA: Diagnosis not present

## 2016-10-03 NOTE — Progress Notes (Signed)
Chief Complaint  Patient presents with  . Vaginal Bleeding    ?fibroids    Blood pressure 140/80, pulse 77, weight 170 lb (77.1 kg).  56 y.o. G3P3 No LMP recorded. Patient is not currently having periods (Reason: Perimenopausal). The current method of family planning is tubal ligation.  Outpatient Encounter Prescriptions as of 10/03/2016  Medication Sig  . cyclobenzaprine (FLEXERIL) 5 MG tablet Take 1 tablet (5 mg total) by mouth 3 (three) times daily as needed for muscle spasms.  Marland Kitchen HYDROcodone-acetaminophen (NORCO) 5-325 MG tablet Take 1 tablet by mouth every 6 (six) hours as needed for moderate pain.  . Multiple Vitamin (MULTIVITAMIN) tablet Take 1 tablet by mouth daily.  . [DISCONTINUED] metroNIDAZOLE (FLAGYL) 500 MG tablet Take 1 tablet (500 mg total) by mouth 2 (two) times daily.   No facility-administered encounter medications on file as of 10/03/2016.     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 Intramural and subserous leiomyoma of uterus 218.1 D25.1 US Pelvis Complete   218.2 D25.2 US Transvaginal Non-OB  2. Abnormal perimenopausal bleeding 627.0 N92.4 US  Pelvis Complete     US Transvaginal Non-OB    Established relevant diagnosis(es):   Plan/Recommendations: No orders of the defined types were placed in this encounter.   Labs or Scans Ordered: Orders Placed This Encounter  Procedures  . US Pelvis Complete  . US Transvaginal Non-OB    Management:: Sonogram in 2 weeks to evaluate the uterus   Follow up Return in about 2 weeks (around 10/17/2016) for GYN sono, Follow up, with Dr Elonda Husky.          All questions were answered.  Past Medical History:  Diagnosis Date  . Anemia   . Fibroid uterus   . GERD (gastroesophageal reflux disease)   . Hyperlipidemia    Borderline    Past Surgical History:  Procedure Laterality Date  . CHOLECYSTECTOMY    . COLONOSCOPY  2003   Dr. Gala Romney: Anal canal hemorrhoids  . COLONOSCOPY N/A 08/25/2014   Procedure: COLONOSCOPY;  Surgeon: Daneil Dolin, MD;  Location: [facility];  Service: Endoscopy;  Laterality: N/A;  1200  . ESOPHAGOGASTRODUODENOSCOPY N/A 08/25/2014   Procedure: ESOPHAGOGASTRODUODENOSCOPY (EGD);  Surgeon: Daneil Dolin, MD;  Location: [facility];  Service: Endoscopy;  Laterality: N/A;  . TUBAL LIGATION      OB History    Gravida Para Term Preterm AB Living   3 3           SAB TAB Ectopic Multiple Live Births                  No Known Allergies  Social History   Social History  .  Marital status: Single    Spouse name: N/A  . Number of children: 3  . Years of education: N/A   Social History Main Topics  . Smoking status: Never Smoker  . Smokeless tobacco: Never Used     Comment: Never smoked  . Alcohol use No  . Drug use: No  . Sexual activity: Not Currently    Birth control/ protection: None   Other Topics Concern  . None   Social History Narrative  . None    Family History  Problem Relation Age of Onset  . Cancer Maternal Grandmother      lymphnodes  . Hypertension Maternal Aunt   . Cancer Maternal Aunt     Breast  . Colon cancer Neg  Hx

## 2016-10-08 ENCOUNTER — Encounter: Payer: Self-pay | Admitting: Family Medicine

## 2016-10-11 ENCOUNTER — Encounter: Payer: BLUE CROSS/BLUE SHIELD | Admitting: Family Medicine

## 2016-10-15 ENCOUNTER — Encounter: Payer: Self-pay | Admitting: Obstetrics & Gynecology

## 2016-10-15 ENCOUNTER — Ambulatory Visit (INDEPENDENT_AMBULATORY_CARE_PROVIDER_SITE_OTHER): Payer: BLUE CROSS/BLUE SHIELD

## 2016-10-15 ENCOUNTER — Ambulatory Visit (INDEPENDENT_AMBULATORY_CARE_PROVIDER_SITE_OTHER): Payer: BLUE CROSS/BLUE SHIELD | Admitting: Obstetrics & Gynecology

## 2016-10-15 VITALS — BP 120/80 | HR 74 | Wt 173.0 lb

## 2016-10-15 DIAGNOSIS — D251 Intramural leiomyoma of uterus: Secondary | ICD-10-CM

## 2016-10-15 DIAGNOSIS — N924 Excessive bleeding in the premenopausal period: Secondary | ICD-10-CM

## 2016-10-15 DIAGNOSIS — R102 Pelvic and perineal pain: Secondary | ICD-10-CM

## 2016-10-15 DIAGNOSIS — D252 Subserosal leiomyoma of uterus: Secondary | ICD-10-CM

## 2016-10-15 NOTE — Progress Notes (Signed)
Preoperative History and Physical  Jacqueline [name] is a 56 y.o. G3P3 with No LMP recorded. Patient is not currently having periods (Reason: Perimenopausal). admitted for a supracervical hysterectomy and removal of both tubes and ovaries due to 20 week size fibroid uterus associated with back, pelvic and lower abdominal pain and post menopausal bleeding.  Cannot perform a pre operative endometrial biopsy because of the distortion of the position of the cervix due to the the fibroid uterus  PMH:    Past Medical History:  Diagnosis Date  . Anemia   . Fibroid uterus   . GERD (gastroesophageal reflux disease)   . Hyperlipidemia    Borderline  . Pre-diabetes    borderline    PSH:     Past Surgical History:  Procedure Laterality Date  . CHOLECYSTECTOMY    . COLONOSCOPY  2003   Dr. Gala Romney: Anal canal hemorrhoids  . COLONOSCOPY N/A 08/25/2014   Procedure: COLONOSCOPY;  Surgeon: Daneil Dolin, MD;  Location: [facility];  Service: Endoscopy;  Laterality: N/A;  1200  . ESOPHAGOGASTRODUODENOSCOPY N/A 08/25/2014   Procedure: ESOPHAGOGASTRODUODENOSCOPY (EGD);  Surgeon: Daneil Dolin, MD;  Location: [facility];  Service: Endoscopy;  Laterality: N/A;  . TUBAL LIGATION      POb/GynH:      OB History    Gravida Para Term Preterm AB Living   3 3           SAB TAB Ectopic Multiple Live Births                  SH:   Social History  Substance Use Topics  . Smoking status: Never Smoker  . Smokeless tobacco: Never Used     Comment: Never smoked  . Alcohol use No    FH:    Family History  Problem Relation Age of Onset  . Cancer Maternal Grandmother         lymphnodes  . Hypertension Maternal Aunt   . Cancer Maternal Aunt        Breast  . Colon cancer Neg Hx      Allergies: No Known Allergies  Medications:       Current Outpatient Prescriptions:  .  cyclobenzaprine (FLEXERIL) 5 MG tablet, Take 1 tablet (5 mg total) by mouth 3 (three) times daily as needed for muscle spasms.,  Disp: 30 tablet, Rfl: 1 .  HYDROcodone-acetaminophen (NORCO) 5-325 MG tablet, Take 1 tablet by mouth every 6 (six) hours as needed for moderate pain. (Patient taking differently: Take 1 tablet by mouth daily as needed for moderate pain. ), Disp: 20 tablet, Rfl: 0 .  Multiple Vitamin (MULTIVITAMIN) tablet, Take 1 tablet by mouth daily., Disp: , Rfl:  .  Chlorpheniramine Maleate (ALLERGY PO), Take 1 tablet by mouth daily as needed (allergies)., Disp: , Rfl:  .  gabapentin (NEURONTIN) 300 MG capsule, Take 300 mg by mouth daily as needed (pain)., Disp: , Rfl:  .  meloxicam (MOBIC) 7.5 MG tablet, Take 1 tablet (7.5 mg total) by mouth daily., Disp: 30 tablet, Rfl: 0 .  Menthol-Methyl Salicylate (MUSCLE RUB) 10-15 % CREA, Apply 1 application topically as needed for muscle pain., Disp: , Rfl:   Review of Systems:   Review of Systems  Constitutional: Negative for fever, chills, weight loss, malaise/fatigue and diaphoresis.  HENT: Negative for hearing loss, ear pain, nosebleeds, congestion, sore throat, neck pain, tinnitus and ear discharge.   Eyes: Negative for blurred vision, double vision, photophobia, pain, discharge and redness.  Respiratory: Negative for cough, hemoptysis, sputum production, shortness of breath, wheezing and stridor.   Cardiovascular: Negative for chest pain, palpitations, orthopnea, claudication, leg swelling and PND.  Gastrointestinal: Positive for abdominal pain. Negative for heartburn, nausea, vomiting, diarrhea, constipation, blood in stool and melena.  Genitourinary: Negative for dysuria, urgency, frequency, hematuria and flank pain.  Musculoskeletal: Negative for myalgias, back pain, joint pain and falls.  Skin: Negative for itching and rash.  Neurological: Negative for dizziness, tingling, tremors, sensory change, speech change, focal weakness, seizures, loss of consciousness, weakness and headaches.  Endo/Heme/Allergies: Negative for environmental allergies and polydipsia.  Does not bruise/bleed easily.  Psychiatric/Behavioral: Negative for depression, suicidal ideas, hallucinations, memory loss and substance abuse. The patient is not nervous/anxious and does not have insomnia.      PHYSICAL EXAM:  Blood pressure 120/80, pulse 74, weight 173 lb (78.5 kg).    Vitals reviewed. Constitutional: She is oriented to person, place, and time. She appears well-developed and well-nourished.  HENT:  Head: Normocephalic and atraumatic.  Right Ear: External ear normal.  Left Ear: External ear normal.  Nose: Nose normal.  Mouth/Throat: Oropharynx is clear and moist.  Eyes: Conjunctivae and EOM are normal. Pupils are equal, round, and reactive to light. Right eye exhibits no discharge. Left eye exhibits no discharge. No scleral icterus.  Neck: Normal range of motion. Neck supple. No tracheal deviation present. No thyromegaly present.  Cardiovascular: Normal rate, regular rhythm, normal heart sounds and intact distal pulses.  Exam reveals no gallop and no friction rub.   No murmur heard. Respiratory: Effort normal and breath sounds normal. No respiratory distress. She has no wheezes. She has no rales. She exhibits no tenderness.  GI: Soft. Bowel sounds are normal. She exhibits no distension and no mass. There is tenderness. There is no rebound and no guarding.  Genitourinary:       Vulva is normal without lesions Vagina is pink moist without discharge Cervix normal in appearance and pap is normal Uterus is 20 weeks size Adnexa is negative with normal sized ovaries by sonogram  Musculoskeletal: Normal range of motion. She exhibits no edema and no tenderness.  Neurological: She is alert and oriented to person, place, and time. She has normal reflexes. She displays normal reflexes. No cranial nerve deficit. She exhibits normal muscle tone. Coordination normal.  Skin: Skin is warm and dry. No rash noted. No erythema. No pallor.  Psychiatric: She has a normal mood and  affect. Her behavior is normal. Judgment and thought content normal.    Labs: Results for orders placed or performed during the hospital encounter of 10/31/16 (from the past 336 hour(s))  Surgical pcr screen   Collection Time: 10/31/16  2:52 PM  Result Value Ref Range   MRSA, PCR NEGATIVE NEGATIVE   Staphylococcus aureus NEGATIVE NEGATIVE  CBC   Collection Time: 10/31/16  2:52 PM  Result Value Ref Range   WBC 5.4 4.0 - 10.5 K/uL   RBC 3.60 (L) 3.87 - 5.11 MIL/uL   Hemoglobin 11.4 (L) 12.0 - 15.0 g/dL   HCT 33.3 (L) 36.0 - 46.0 %   MCV 92.5 78.0 - 100.0 fL   MCH 31.7 26.0 - 34.0 pg   MCHC 34.2 30.0 - 36.0 g/dL   RDW 13.4 11.5 - 15.5 %   Platelets 197 150 - 400 K/uL  Comprehensive metabolic panel   Collection Time: 10/31/16  2:52 PM  Result Value Ref Range   Sodium 136 135 - 145 mmol/L   Potassium 3.9  3.5 - 5.1 mmol/L   Chloride 105 101 - 111 mmol/L   CO2 26 22 - 32 mmol/L   Glucose, Bld 111 (H) 65 - 99 mg/dL   BUN 14 6 - 20 mg/dL   Creatinine, Ser 0.92 0.44 - 1.00 mg/dL   Calcium 8.8 (L) 8.9 - 10.3 mg/dL   Total Protein 6.7 6.5 - 8.1 g/dL   Albumin 3.7 3.5 - 5.0 g/dL   AST 28 15 - 41 U/L   ALT 17 14 - 54 U/L   Alkaline Phosphatase 76 38 - 126 U/L   Total Bilirubin 0.4 0.3 - 1.2 mg/dL   GFR calc non Af Amer >60 >60 mL/min   GFR calc Af Amer >60 >60 mL/min   Anion gap 5 5 - 15  hCG, quantitative, pregnancy   Collection Time: 10/31/16  2:52 PM  Result Value Ref Range   hCG, Beta Chain, Quant, S 2 <5 mIU/mL  Rapid HIV screen (HIV 1/2 Ab+Ag)   Collection Time: 10/31/16  2:52 PM  Result Value Ref Range   HIV-1 P24 Antigen - HIV24 NON REACTIVE NON REACTIVE   HIV 1/2 Antibodies NON REACTIVE NON REACTIVE   Interpretation (HIV Ag Ab)      A non reactive test result means that HIV 1 or HIV 2 antibodies and HIV 1 p24 antigen were not detected in the specimen.  Urinalysis, Routine w reflex microscopic   Collection Time: 10/31/16  2:52 PM  Result Value Ref Range   Color,  Urine YELLOW YELLOW   APPearance CLEAR CLEAR   Specific Gravity, Urine 1.023 1.005 - 1.030   pH 6.0 5.0 - 8.0   Glucose, UA NEGATIVE NEGATIVE mg/dL   Hgb urine dipstick MODERATE (A) NEGATIVE   Bilirubin Urine NEGATIVE NEGATIVE   Ketones, ur NEGATIVE NEGATIVE mg/dL   Protein, ur NEGATIVE NEGATIVE mg/dL   Nitrite NEGATIVE NEGATIVE   Leukocytes, UA NEGATIVE NEGATIVE   RBC / HPF 0-5 0 - 5 RBC/hpf   WBC, UA 0-5 0 - 5 WBC/hpf   Bacteria, UA NONE SEEN NONE SEEN   Squamous Epithelial / LPF 0-5 (A) NONE SEEN   Mucous PRESENT   Type and screen   Collection Time: 10/31/16  2:52 PM  Result Value Ref Range   ABO/RH(D) O POS    Antibody Screen NEG    Sample Expiration 11/14/2016    Extend sample reason NO TRANSFUSIONS OR PREGNANCY IN THE PAST 3 MONTHS     EKG: Orders placed or performed during the hospital encounter of 01/24/12  . Exercise Tolerance Test  . Exercise Tolerance Test    Imaging Studies: US Transvaginal Non-ob  Result Date: 11/05/2016 GYNECOLOGIC SONOGRAM Jacqueline [name] is a 56 y.o. G3P3 ,she is here for a pelvic sonogram for abnormal perimenopausal bleeding. Uterus                      17.4 x 7.7 x 10.6 cm,  enlarged heterogeneous anteverted uterus w/mult fibroids Endometrium          9.9 mm, distorted by a  2.8 x 2.5 x 3 cm submucosal fibroid Right ovary             2.7 x 2.5 x 2.5 cm, wnl Left ovary                3.1 x 1.6 x 2.7 cm, wnl Fibroids                   (#  1) fundal subserosal 4.5 x 4.1 x 4.8 cm,(#2) left LUS subserosal 4.3 x 4.8 x 3.6 cm,(#3) anterior right LUS 5.3 X 6.1 X 4.7 cm,(#4) 2.8 x 2.5 x 3 cm mid/ submucosal fibroid Technician Comments: PELVIC US TA/TV: enlarged heterogeneous anteverted uterus w/mult fibroids,(#1) fundal subserosal 4.5 x 4.1 x 4.8 cm,(#2) left LUS subserosal 4.3 x 4.8 x 3.6 cm,(#3) anterior right LUS 5.3 X 6.1 X 4.7 cm,(#4) 2.8 x 2.5 x 3 cm mid/ submucosal fibroid,normal ovaries bilat,right ovary seen on T/A,EEC 9.9 mm,no free fluid Silver Huguenin  10/15/2016 4:46 PM Clinical Impression and recommendations: I have reviewed the sonogram results above, combined with the patient's current clinical course, below are my impressions and any appropriate recommendations for management based on the sonographic findings. Uterine volume 739 cc due to multiple fibroids, including a submucosal myoma Endometrium is normal except for distorting myoma Both ovaries are normal EURE,LUTHER H   US Pelvis Complete  Result Date: 11/05/2016 GYNECOLOGIC SONOGRAM Jacqueline [name] is a 56 y.o. G3P3 ,she is here for a pelvic sonogram for abnormal perimenopausal bleeding. Uterus                      17.4 x 7.7 x 10.6 cm,  enlarged heterogeneous anteverted uterus w/mult fibroids Endometrium          9.9 mm, distorted by a  2.8 x 2.5 x 3 cm submucosal fibroid Right ovary             2.7 x 2.5 x 2.5 cm, wnl Left ovary                3.1 x 1.6 x 2.7 cm, wnl Fibroids                   (#1) fundal subserosal 4.5 x 4.1 x 4.8 cm,(#2) left LUS subserosal 4.3 x 4.8 x 3.6 cm,(#3) anterior right LUS 5.3 X 6.1 X 4.7 cm,(#4) 2.8 x 2.5 x 3 cm mid/ submucosal fibroid Technician Comments: PELVIC US TA/TV: enlarged heterogeneous anteverted uterus w/mult fibroids,(#1) fundal subserosal 4.5 x 4.1 x 4.8 cm,(#2) left LUS subserosal 4.3 x 4.8 x 3.6 cm,(#3) anterior right LUS 5.3 X 6.1 X 4.7 cm,(#4) 2.8 x 2.5 x 3 cm mid/ submucosal fibroid,normal ovaries bilat,right ovary seen on T/A,EEC 9.9 mm,no free fluid Silver Huguenin 10/15/2016 4:46 PM Clinical Impression and recommendations: I have reviewed the sonogram results above, combined with the patient's current clinical course, below are my impressions and any appropriate recommendations for management based on the sonographic findings. Uterine volume 739 cc due to multiple fibroids, including a submucosal myoma Endometrium is normal except for distorting myoma Both ovaries are normal EURE,LUTHER H      Assessment: Enlarged fibroid uterus, 20 weeks  size Perimenopausal bleeding Pelvic pain  Patient Active Problem List   Diagnosis Date Noted  . Colon cancer screening   . Gastroesophageal reflux disease with esophagitis   . Anemia 08/04/2014  . GERD (gastroesophageal reflux disease) 08/04/2014  . Shoulder strain 07/26/2014  . Degenerative arthritis of thumb 07/26/2014  . Hoarse voice quality 02/28/2014  . Left knee pain 02/28/2014  . Other malaise and fatigue 02/28/2014  . Hip pain 04/15/2013  . Inflamed hair follicle 75/91/6384  . Epicondylitis, lateral (tennis elbow) 04/13/2013  . Fibroid uterus 10/05/2012  . Constipation 09/15/2012  . Vaginitis 02/02/2012  . Hyperlipidemia 11/28/2011  . Obesity 09/15/2011  . ALLERGIC RHINITIS, SEASONAL 11/13/2007    Plan: Supracervical hysterectomy  with removal of both tubes and ovaries  Pt understands the risks of surgery including but not limited t  excessive bleeding requiring transfusion or reoperation, post-operative infection requiring prolonged hospitalization or re-hospitalization and antibiotic therapy, and damage to other organs including bladder, bowel, ureters and major vessels.  The patient also understands the alternative treatment options which were discussed in full.  All questions were answered.  EURE,LUTHER H 11/05/2016 1:17 PM   EURE,LUTHER H 11/05/2016 1:17 PM      Face to face time:  25 minutes  Greater than 50% of the visit time was spent in counseling and coordination of care with the patient.  The summary and outline of the counseling and care coordination is summarized in the note above.   All questions were answered.

## 2016-10-15 NOTE — Progress Notes (Signed)
PELVIC US TA/TV: enlarged heterogeneous anteverted uterus w/mult fibroids,(#1) fundal subserosal 4.5 x 4.1 x 4.8 cm,(#2) left LUS subserosal 4.3 x 4.8 x 3.6 cm,(#3) anterior right LUS 5.3 X 6.1 X 4.7 cm,(#4) 2.8 x 2.5 x 3 cm mid/ submucosal fibroid,normal ovaries bilat,right ovary seen on T/A,EEC 9.9 mm,no free fluid

## 2016-10-18 ENCOUNTER — Ambulatory Visit (INDEPENDENT_AMBULATORY_CARE_PROVIDER_SITE_OTHER): Payer: BLUE CROSS/BLUE SHIELD | Admitting: Family Medicine

## 2016-10-18 ENCOUNTER — Encounter: Payer: Self-pay | Admitting: Family Medicine

## 2016-10-18 VITALS — BP 136/72 | HR 82 | Temp 98.0°F | Resp 16 | Ht 63.0 in | Wt 174.0 lb

## 2016-10-18 DIAGNOSIS — M7712 Lateral epicondylitis, left elbow: Secondary | ICD-10-CM

## 2016-10-18 DIAGNOSIS — S46911A Strain of unspecified muscle, fascia and tendon at shoulder and upper arm level, right arm, initial encounter: Secondary | ICD-10-CM | POA: Diagnosis not present

## 2016-10-18 MED ORDER — MELOXICAM 7.5 MG PO TABS: 7.5000 mg | ORAL_TABLET | Freq: Every day | ORAL | 0 refills | Status: DC

## 2016-10-18 NOTE — Progress Notes (Signed)
     Subjective:    Patient ID: Jacqueline [name], female    DOB: [birth year], 56 y.o.   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 Left elbow worse than shoulder. Both have been present for past 2 months but she did not bring it up. She is worried may have bee result of her fall which she injured her back. She has appt for MRI this afternoon for back   Left arm/elbow hurts worse she she is lifting things, or grasping or twisting, such as opening cans/jars, or the repetitive grasp she does on the parts at her job.  Right shoulder hurts posteriorly , but can use as normal. She has been applying a roll on to both which helps some.   Review Of Systems:  GEN- denies fatigue, fever, weight loss,weakness, recent illness HEENT- denies eye drainage, change in vision, nasal discharge, CVS- denies chest pain, palpitations RESP- denies SOB, cough, wheeze ABD- denies N/V, change in stools, abd pain GU- denies dysuria, hematuria, dribbling, incontinence MSK- + joint pain, muscle aches, injury Neuro- denies headache, dizziness, syncope, seizure activity       Objective:    BP 136/72   Pulse 82   Temp 98 F (36.7 C) (Oral)   Resp 16   Ht 5\' 3"  (1.6 m)   Wt 174 lb (78.9 kg)   SpO2 98%   BMI 30.82 kg/m  GEN- NAD, alert and oriented x3 Neck- Supple, good ROM MSK- normal inspection elbow and shoulders. TTP lateral epincondyle left side, no swelling noted, pain with rotation of wrist/forearm, FROM left shoulder, biceps in tact, FROM bilat Wrist, Mild TTP post Shoulder above blade, no winged scapula, rotator cuff in tact  Neuro- strength in tact UE, sensastion grossly in tact  EXT- No edema Pulses- Radial  2+        Assessment & Plan:      Problem List Items Addressed This Visit    Shoulder strain    Good ROM, rotator cuff in tact, per above       Epicondylitis, lateral (tennis elbow) - Primary    Discussed elbow sleeve, Mobic, ICE, due to repetitive motions but I can not garentee this was result of her fall at work. would try conservative treatmnt for 4 week, if not refer to ortho for injection      Relevant Medications   meloxicam (MOBIC) 7.5 MG tablet      Note: This dictation was prepared with Dragon dictation along with smaller phrase technology. Any transcriptional errors that result from this process are unintentional.

## 2016-10-18 NOTE — Assessment & Plan Note (Signed)
Good ROM, rotator cuff in tact, per above

## 2016-10-18 NOTE — Assessment & Plan Note (Signed)
Discussed elbow sleeve, Mobic, ICE, due to repetitive motions but I can not garentee this was result of her fall at work. would try conservative treatmnt for 4 week, if not refer to ortho for injection

## 2016-10-18 NOTE — Patient Instructions (Signed)
Use the ICE Take anti-inflammatory  Use elbow sleeve  F/U AS needed

## 2016-10-28 ENCOUNTER — Telehealth: Payer: Self-pay | Admitting: Obstetrics & Gynecology

## 2016-10-29 ENCOUNTER — Telehealth: Payer: Self-pay | Admitting: *Deleted

## 2016-10-29 NOTE — Patient Instructions (Signed)
Jacqueline [name]  10/29/2016     @PREFPERIOPPHARMACY @   Your procedure is scheduled on  11/06/2016   Report to [facility] at  830   A.M.  Call this number if you have problems the morning of surgery:  [phone]   Remember:  Do not eat food or drink liquids after midnight.  Take these medicines the morning of surgery with A SIP OF WATER  Gabapentin.   Do not wear jewelry, make-up or nail polish.  Do not wear lotions, powders, or perfumes, or deoderant.  Do not shave 48 hours prior to surgery.  Men may shave face and neck.  Do not bring valuables to the hospital.  [facility] is not responsible for any belongings or valuables.  Contacts, dentures or bridgework may not be worn into surgery.  Leave your suitcase in the car.  After surgery it may be brought to your room.  For patients admitted to the hospital, discharge time will be determined by your treatment team.  Patients discharged the day of surgery will not be allowed to drive home.   Name and phone number of your driver:   family Special instructions:  None  Please read over the following fact sheets that you were given. Anesthesia Post-op Instructions and Care and Recovery After Surgery      Bilateral Salpingo-Oophorectomy Bilateral salpingo-oophorectomy is the surgical removal of both fallopian tubes and both ovaries. The ovaries are small organs that produce eggs in women. The fallopian tubes transport the egg from the ovary to the womb (uterus). Usually, when this surgery is done, the uterus was previously removed. A bilateral salpingo-oophorectomy may be done to treat cancer or to reduce the risk of cancer in women who are at high risk. Removing both fallopian tubes and both ovaries will make you unable to become pregnant (sterile). It will also put you into menopause so that you will no longer have menstrual periods and may have menopausal symptoms such as hot flashes, night sweats, and mood  changes. It will not affect your sex drive. Tell a health care provider about:  Any allergies you have.  All medicines you are taking, including vitamins, herbs, eye drops, creams, and over-the-counter medicines.  Previous problems you or family members have had with anesthetic medicines.  Any blood disorders you have.  Previous surgeries you have had.  Any medical conditions you have. What are the risks? Generally, this is a safe procedure. However, as with any procedure, complications can occur. Possible complications include:  Injury to surrounding organs.  Bleeding.  Infection.  Blood clots in the legs or lungs.  Problems related to anesthesia. What happens before the procedure?  Ask your health care provider about changing or stopping your regular medicines. You may need to stop taking certain medicines, such as aspirin or blood thinners, at least 1 week before the surgery.  Do not eat or drink anything for at least 8 hours before the surgery.  If you smoke, do not smoke for at least 2 weeks before the surgery.  Make plans to have someone drive you home after the procedure or after your hospital stay. Also arrange for someone to help you with activities during recovery. What happens during the procedure?  You will be given medicine to help you relax before the procedure (sedative). You will then be given medicine to make you sleep through the procedure (general anesthetic). These medicines will be  given through an IV access tube that is put into one of your veins.  Once you are asleep, your lower abdomen will be shaved and cleaned. A thin, flexible tube (catheter) will be placed in your bladder.  The surgeon may use a laparoscopic, robotic, or open technique for this surgery:  In the laparoscopic technique, the surgery is done through two small cuts (incisions) in the abdomen. A thin, lighted tube with a tiny camera on the end (laparoscope) is inserted into one of the  incisions. The tools needed for the procedure are put through the other incision.  A robotic technique may be chosen to perform complex surgery in a small space. In the robotic technique, small incisions will be made. A camera and surgical instruments are passed through the incisions. Surgical instruments will be controlled with the help of a robotic arm.  In the open technique, the surgery is done through one large incision in the abdomen.  Using any of these techniques, the surgeon removes the fallopian tubes and ovaries. The blood vessels will be clamped and tied.  The surgeon then uses staples or stitches to close the incision or incisions. What happens after the procedure?  You will be taken to a recovery area where you will be monitored for 1 to 3 hours. Your blood pressure, pulse, and temperature will be checked often. You will remain in the recovery area until you are stable and waking up.  If the laparoscopic technique was used, you may be allowed to go home after several hours. You may have some shoulder pain after the laparoscopic procedure. This is normal and usually goes away in a day or two.  If the open technique was used, you will be admitted to the hospital for a couple of days.  You will be given pain medicine as needed.  The IV access tube and catheter will be removed before you are discharged. This information is not intended to replace advice given to you by your health care provider. Make sure you discuss any questions you have with your health care provider. Document Released: 05/27/2005 Document Revised: 04/26/2016 Document Reviewed: 11/18/2012 Elsevier Interactive Patient Education  2017 [employer] Bilateral Salpingo-Oophorectomy, Care After Refer to this sheet in the next few weeks. These instructions provide you with information on caring for yourself after your procedure. Your health care provider may also give you more specific instructions. Your treatment has  been planned according to current medical practices, but problems sometimes occur. Call your health care provider if you have any problems or questions after your procedure. What can I expect after the procedure? After the procedure, it is typical to have the following:  Abdominal pain that can be controlled with medicine.  Vaginal spotting.  Constipation.  Menopausal symptoms such as hot flashes, vaginal dryness, and mood swings. Follow these instructions at home:  Get plenty of rest and sleep.  Only take over-the-counter or prescription medicines as directed by your health care provider. Do not take aspirin. It can cause bleeding.  Keep incision areas clean and dry. Remove or change bandages (dressings) only as directed by your health care provider.  Take showers instead of baths for a few weeks as directed by your health care provider.  Limit exercise and activities as directed by your health care provider. Do not lift anything heavier than 5 pounds (2.3 kg) until your health care provider approves.  Do not drive until your health care provider approves.  Follow your health care provider's advice  regarding diet. You may be able to resume your usual diet right away.  Drink enough fluids to keep your urine clear or pale yellow.  Do not douche, use tampons, or have sexual intercourse for 6 weeks after the procedure.  Do not drink alcohol until your health care provider says it is okay.  Take your temperature twice a day and write it down.  If you become constipated, you may:  Ask your health care provider about taking a mild laxative.  Add more fruit and bran to your diet.  Drink more fluids.  Follow up with your health care provider as directed. Contact a health care provider if:  You have swelling, redness, or increasing pain in the incision area.  You see pus coming from the incision area.  You notice a bad smell coming from the wound or dressing.  You have  pain, redness, or swelling where the IV access tube was placed.  Your incision is breaking open (the edges are not staying together).  You feel dizzy or feel like fainting.  You develop pain or bleeding when you urinate.  You develop diarrhea.  You develop nausea and vomiting.  You develop abnormal vaginal discharge.  You develop a rash.  You have pain that is not controlled with medicine. Get help right away if:  You develop a fever.  You develop abdominal pain.  You have chest pain.  You develop shortness of breath.  You pass out.  You develop pain, swelling, or redness in your leg.  You develop heavy vaginal bleeding with or without blood clots. This information is not intended to replace advice given to you by your health care provider. Make sure you discuss any questions you have with your health care provider. Document Released: 05/27/2005 Document Revised: 04/26/2016 Document Reviewed: 11/18/2012 Elsevier Interactive Patient Education  2017 [place] Hysterectomy A supracervical hysterectomy is surgery to remove the top part of the uterus, but not the cervix. You will no longer have menstrual periods or be able to get pregnant after this surgery. The fallopian tubes and ovaries may also be removed (bilateral salpingo-oophorectomy) during this surgery. This surgery is usually performed using a minimally invasive technique called laparoscopy. This technique allows the surgery to be done through small incisions. The minimally invasive technique provides benefits such as less pain, less risk of infection, and shorter recovery time. Tell a health care provider about:  Any allergies you have.  All medicines you are taking, including vitamins, herbs, eye drops, creams, and over-the-counter medicines.  Any problems you or family members have had with anesthetic medicines.  Any blood disorders you have.  Any surgeries you have had.  Any medical  conditions you have. What are the risks? Generally, this is a safe procedure. However, as with any procedure, complications can occur. Possible complications include:  Bleeding.  Blood clots in the legs or lung.  Infection.  Injury to surrounding organs.  Problems related to anesthesia.  Conversion to an open abdominal surgery.  Additional surgery later to remove the cervix if you have problems with the cervix. What happens before the procedure?  Ask your health care provider about changing or stopping your regular medicines.  Do not take aspirin or blood thinners (anticoagulants) for 1 week before the surgery, or as directed by your health care provider.  Do not eat or drink anything for 8 hours before the surgery, or as directed by your health care provider.  Quit smoking if you smoke.  Arrange  for a ride home after surgery and for someone to help you at home during recovery. What happens during the procedure?  You will be given an antibiotic medicine.  An IV tube will be placed in one of your veins. You will be given medicine to make you sleep (general anesthetic).  A gas (carbon dioxide) will be used to inflate your abdomen. This will allow your surgeon to look inside your abdomen, perform your surgery, and treat any other problems found if necessary.  Three or four small incisions will be made in your abdomen. One of these incisions will be made in the area of your belly button (navel). A thin, flexible tube with a tiny camera and light on the end of it (laparoscope) will be inserted into the incision. The camera on the laparoscope sends a picture to a TV screen in the operating room. This gives your surgeon a good view inside the abdomen.  Other surgical instruments will be inserted through the other incisions.  The uterus will be cut into small pieces and removed through the small incisions.  Your incisions will be closed. What happens after the procedure?  You  will be taken to a recovery area where your progress will be monitored until you are awake, stable, and taking fluids well. If there are no other problems, you will then be moved to a regular hospital room, or you will be allowed to go home.  You will likely have minimal discomfort after the surgery because the incisions are so small with the laparoscopic technique.  You will be given pain medicine while you are in the hospital and for when you go home.  If a bilateral salpingo-oophorectomy was performed before menopause, you will go through a sudden (abrupt) menopause. This can be helped with hormone medicines. This information is not intended to replace advice given to you by your health care provider. Make sure you discuss any questions you have with your health care provider. Document Released: 11/13/2007 Document Revised: 11/02/2015 Document Reviewed: 11/27/2012 Elsevier Interactive Patient Education  2017 [place] Hysterectomy, Care After Refer to this sheet in the next few weeks. These instructions provide you with information on caring for yourself after your procedure. Your health care provider may also give you more specific instructions. Your treatment has been planned according to current medical practices, but problems sometimes occur. Call your health care provider if you have any problems or questions after your procedure. What can I expect after the procedure? After your procedure, it is typical to have some discomfort, tenderness, swelling, and bruising at the surgical sites. This normally lasts for about 2 weeks. Follow these instructions at home:  Get plenty of rest and sleep.  Only take over-the-counter or prescription medicines as directed by your health care provider.  Do not take aspirin. It can cause bleeding.  Do not drive until your health care provider approves.  Follow your health care provider's advice regarding exercise, lifting, and general  activities.  Resume your usual diet as directed by your health care provider.  Do not douche, use tampons, or have sexual intercourse for at least 6 weeks or until your health care provider gives you permission.  Change your bandages (dressings) only as directed by your health care provider.  Monitor your temperature.  Take showers instead of baths for 2-3 weeks or as directed by your health care provider.  Drink enough fluids to keep your urine clear or pale yellow.  Do not drink alcohol until  your health care provider gives you permission.  If you are constipated, you may take a mild laxative if your health care provider approves. Bran foods may also help with constipation problems.  Try to have someone home with you for 1-2 weeks to help with activities.  Follow up with your health care provider as directed. Contact a health care provider if:  You have swelling, redness, or increasing pain in the incision area.  You have pus coming from an incision.  You notice a bad smell coming from the incision or dressing.  You have swelling, redness, or pain in the area around the IV site.  Your incision breaks open.  You feel dizzy or lightheaded.  You have pain or bleeding when you urinate.  You have persistent diarrhea.  You have persistent nausea and vomiting.  You have abnormal vaginal discharge.  You have a rash.  Your pain is not controlled with your prescribed medicine. Get help right away if:  You have a fever.  You have severe abdominal pain.  You have chest pain.  You have shortness of breath.  You faint.  You have pain, swelling, or redness in your leg.  You have heavy vaginal bleeding with blood clots. This information is not intended to replace advice given to you by your health care provider. Make sure you discuss any questions you have with your health care provider. Document Released: 03/17/2013 Document Revised: 11/02/2015 Document Reviewed:  11/27/2012 Elsevier Interactive Patient Education  2017 [place] Anesthesia, Adult General anesthesia is the use of medicines to make a person "go to sleep" (be unconscious) for a medical procedure. General anesthesia is often recommended when a procedure:  Is long.  Requires you to be still or in an unusual position.  Is major and can cause you to lose blood.  Is impossible to do without general anesthesia. The medicines used for general anesthesia are called general anesthetics. In addition to making you sleep, the medicines:  Prevent pain.  Control your blood pressure.  Relax your muscles. Tell a health care provider about:  Any allergies you have.  All medicines you are taking, including vitamins, herbs, eye drops, creams, and over-the-counter medicines.  Any problems you or family members have had with anesthetic medicines.  Types of anesthetics you have had in the past.  Any bleeding disorders you have.  Any surgeries you have had.  Any medical conditions you have.  Any history of heart or lung conditions, such as heart failure, sleep apnea, or chronic obstructive pulmonary disease (COPD).  Whether you are pregnant or may be pregnant.  Whether you use tobacco, alcohol, marijuana, or street drugs.  Any history of Armed forces logistics/support/administrative officer.  Any history of depression or anxiety. What are the risks? Generally, this is a safe procedure. However, problems may occur, including:  Allergic reaction to anesthetics.  Lung and heart problems.  Inhaling food or liquids from your stomach into your lungs (aspiration).  Injury to nerves.  Waking up during your procedure and being unable to move (rare).  Extreme agitation or a state of mental confusion (delirium) when you wake up from the anesthetic.  Air in the bloodstream, which can lead to stroke. These problems are more likely to develop if you are having a major surgery or if you have an advanced medical  condition. You can prevent some of these complications by answering all of your health care provider's questions thoroughly and by following all pre-procedure instructions. General anesthesia can cause  side effects, including:  Nausea or vomiting  A sore throat from the breathing tube.  Feeling cold or shivery.  Feeling tired, washed out, or achy.  Sleepiness or drowsiness.  Confusion or agitation. What happens before the procedure? Staying hydrated  Follow instructions from your health care provider about hydration, which may include:  Up to 2 hours before the procedure - you may continue to drink clear liquids, such as water, clear fruit juice, black coffee, and plain tea. Eating and drinking restrictions  Follow instructions from your health care provider about eating and drinking, which may include:  8 hours before the procedure - stop eating heavy meals or foods such as meat, fried foods, or fatty foods.  6 hours before the procedure - stop eating light meals or foods, such as toast or cereal.  6 hours before the procedure - stop drinking milk or drinks that contain milk.  2 hours before the procedure - stop drinking clear liquids. Medicines   Ask your health care provider about:  Changing or stopping your regular medicines. This is especially important if you are taking diabetes medicines or blood thinners.  Taking medicines such as aspirin and ibuprofen. These medicines can thin your blood. Do not take these medicines before your procedure if your health care provider instructs you not to.  Taking new dietary supplements or medicines. Do not take these during the week before your procedure unless your health care provider approves them.  If you are told to take a medicine or to continue taking a medicine on the day of the procedure, take the medicine with sips of water. General instructions    Ask if you will be going home the same day, the following day, or after a  longer hospital stay.  Plan to have someone take you home.  Plan to have someone stay with you for the first 24 hours after you leave the hospital or clinic.  For 3-6 weeks before the procedure, try not to use any tobacco products, such as cigarettes, chewing tobacco, and e-cigarettes.  You may brush your teeth on the morning of the procedure, but make sure to spit out the toothpaste. What happens during the procedure?  You will be given anesthetics through a mask and through an IV tube in one of your veins.  You may receive medicine to help you relax (sedative).  As soon as you are asleep, a breathing tube may be used to help you breathe.  An anesthesia specialist will stay with you throughout the procedure. He or she will help keep you comfortable and safe by continuing to give you medicines and adjusting the amount of medicine that you get. He or she will also watch your blood pressure, pulse, and oxygen levels to make sure that the anesthetics do not cause any problems.  If a breathing tube was used to help you breathe, it will be removed before you wake up. The procedure may vary among health care providers and hospitals. What happens after the procedure?  You will wake up, often slowly, after the procedure is complete, usually in a recovery area.  Your blood pressure, heart rate, breathing rate, and blood oxygen level will be monitored until the medicines you were given have worn off.  You may be given medicine to help you calm down if you feel anxious or agitated.  If you will be going home the same day, your health care provider may check to make sure you can stand, drink, and urinate.  Your health care providers will treat your pain and side effects before you go home.  Do not drive for 24 hours if you received a sedative.  You may:  Feel nauseous and vomit.  Have a sore throat.  Have mental slowness.  Feel cold or shivery.  Feel sleepy.  Feel tired.  Feel  sore or achy, even in parts of your body where you did not have surgery. This information is not intended to replace advice given to you by your health care provider. Make sure you discuss any questions you have with your health care provider. Document Released: 09/03/2007 Document Revised: 11/07/2015 Document Reviewed: 05/11/2015 Elsevier Interactive Patient Education  2017 [place] Anesthesia, Adult, Care After These instructions provide you with information about caring for yourself after your procedure. Your health care provider may also give you more specific instructions. Your treatment has been planned according to current medical practices, but problems sometimes occur. Call your health care provider if you have any problems or questions after your procedure. What can I expect after the procedure? After the procedure, it is common to have:  Vomiting.  A sore throat.  Mental slowness. It is common to feel:  Nauseous.  Cold or shivery.  Sleepy.  Tired.  Sore or achy, even in parts of your body where you did not have surgery. Follow these instructions at home: For at least 24 hours after the procedure:   Do not:  Participate in activities where you could fall or become injured.  Drive.  Use heavy machinery.  Drink alcohol.  Take sleeping pills or medicines that cause drowsiness.  Make important decisions or sign legal documents.  Take care of children on your own.  Rest. Eating and drinking   If you vomit, drink water, juice, or soup when you can drink without vomiting.  Drink enough fluid to keep your urine clear or pale yellow.  Make sure you have little or no nausea before eating solid foods.  Follow the diet recommended by your health care provider. General instructions   Have a responsible adult stay with you until you are awake and alert.  Return to your normal activities as told by your health care provider. Ask your health care  provider what activities are safe for you.  Take over-the-counter and prescription medicines only as told by your health care provider.  If you smoke, do not smoke without supervision.  Keep all follow-up visits as told by your health care provider. This is important. Contact a health care provider if:  You continue to have nausea or vomiting at home, and medicines are not helpful.  You cannot drink fluids or start eating again.  You cannot urinate after 8-12 hours.  You develop a skin rash.  You have fever.  You have increasing redness at the site of your procedure. Get help right away if:  You have difficulty breathing.  You have chest pain.  You have unexpected bleeding.  You feel that you are having a life-threatening or urgent problem. This information is not intended to replace advice given to you by your health care provider. Make sure you discuss any questions you have with your health care provider. Document Released: 09/02/2000 Document Revised: 10/30/2015 Document Reviewed: 05/11/2015 Elsevier Interactive Patient Education  2017 [employer].

## 2016-10-29 NOTE — Telephone Encounter (Signed)
Patient called wanting to know when after her surgery will she be able to resume her back therapy? She starts therapy tomorrow and wants to give them some idea of when she may be back. Please advise.

## 2016-10-30 NOTE — Telephone Encounter (Signed)
yes

## 2016-10-30 NOTE — Telephone Encounter (Signed)
Sorry, not normal but she does have big fibroids so not suprising

## 2016-10-30 NOTE — Telephone Encounter (Signed)
Informed patient that the bleeding at this time is due to the large fibroids so hopefully after the surgery, the bleeding will be gone.  Also made aware that she will be able to have back therapy but will want to discuss when to return at her post-op appt. Verbalized understanding.

## 2016-10-31 ENCOUNTER — Encounter (HOSPITAL_COMMUNITY): Payer: Self-pay

## 2016-10-31 ENCOUNTER — Encounter (HOSPITAL_COMMUNITY)
Admission: RE | Admit: 2016-10-31 | Discharge: 2016-10-31 | Disposition: A | Discharge status: Home / Self Care | Payer: BLUE CROSS/BLUE SHIELD | Source: Ambulatory Visit | Attending: Obstetrics & Gynecology | Admitting: Obstetrics & Gynecology

## 2016-10-31 ENCOUNTER — Other Ambulatory Visit: Payer: Self-pay | Admitting: Obstetrics & Gynecology

## 2016-10-31 DIAGNOSIS — N852 Hypertrophy of uterus: Secondary | ICD-10-CM | POA: Insufficient documentation

## 2016-10-31 DIAGNOSIS — Z01812 Encounter for preprocedural laboratory examination: Secondary | ICD-10-CM | POA: Diagnosis not present

## 2016-10-31 HISTORY — DX: Prediabetes: R73.03

## 2016-10-31 LAB — COMPREHENSIVE METABOLIC PANEL
ALT: 17 U/L (ref 14–54)
ANION GAP: 5 (ref 5–15)
AST: 28 U/L (ref 15–41)
Albumin: 3.7 g/dL (ref 3.5–5.0)
Alkaline Phosphatase: 76 U/L (ref 38–126)
BUN: 14 mg/dL (ref 6–20)
CHLORIDE: 105 mmol/L (ref 101–111)
CO2: 26 mmol/L (ref 22–32)
CREATININE: 0.92 mg/dL (ref 0.44–1.00)
Calcium: 8.8 mg/dL — ABNORMAL LOW (ref 8.9–10.3)
Glucose, Bld: 111 mg/dL — ABNORMAL HIGH (ref 65–99)
Potassium: 3.9 mmol/L (ref 3.5–5.1)
SODIUM: 136 mmol/L (ref 135–145)
Total Bilirubin: 0.4 mg/dL (ref 0.3–1.2)
Total Protein: 6.7 g/dL (ref 6.5–8.1)

## 2016-10-31 LAB — RAPID HIV SCREEN (HIV 1/2 AB+AG)
HIV 1/2 ANTIBODIES: NONREACTIVE
HIV-1 P24 ANTIGEN - HIV24: NONREACTIVE

## 2016-10-31 LAB — CBC
HCT: 33.3 % — ABNORMAL LOW (ref 36.0–46.0)
Hemoglobin: 11.4 g/dL — ABNORMAL LOW (ref 12.0–15.0)
MCH: 31.7 pg (ref 26.0–34.0)
MCHC: 34.2 g/dL (ref 30.0–36.0)
MCV: 92.5 fL (ref 78.0–100.0)
PLATELETS: 197 10*3/uL (ref 150–400)
RBC: 3.6 MIL/uL — AB (ref 3.87–5.11)
RDW: 13.4 % (ref 11.5–15.5)
WBC: 5.4 10*3/uL (ref 4.0–10.5)

## 2016-10-31 LAB — URINALYSIS, ROUTINE W REFLEX MICROSCOPIC
BACTERIA UA: NONE SEEN
Bilirubin Urine: NEGATIVE
GLUCOSE, UA: NEGATIVE mg/dL
KETONES UR: NEGATIVE mg/dL
LEUKOCYTES UA: NEGATIVE
NITRITE: NEGATIVE
PROTEIN: NEGATIVE mg/dL
Specific Gravity, Urine: 1.023 (ref 1.005–1.030)
pH: 6 (ref 5.0–8.0)

## 2016-10-31 LAB — SURGICAL PCR SCREEN
MRSA, PCR: NEGATIVE
Staphylococcus aureus: NEGATIVE

## 2016-10-31 LAB — HCG, QUANTITATIVE, PREGNANCY: HCG, BETA CHAIN, QUANT, S: 2 m[IU]/mL (ref <5)

## 2016-11-01 ENCOUNTER — Other Ambulatory Visit (HOSPITAL_COMMUNITY): Payer: BLUE CROSS/BLUE SHIELD

## 2016-11-04 LAB — TYPE AND SCREEN
ABO/RH(D): O POS
ANTIBODY SCREEN: NEGATIVE

## 2016-11-05 NOTE — Progress Notes (Signed)
Spoke to Dr. Lerry Liner about patient's hemoglobin and hematacrit levels. She has been typed and screen. No further orders given at this time per Dr. Patsey Berthold.

## 2016-11-06 ENCOUNTER — Inpatient Hospital Stay (HOSPITAL_COMMUNITY): Payer: BLUE CROSS/BLUE SHIELD | Admitting: Anesthesiology

## 2016-11-06 ENCOUNTER — Inpatient Hospital Stay (HOSPITAL_COMMUNITY)
Admission: RE | Admit: 2016-11-06 | Discharge: 2016-11-07 | DRG: 743 | Disposition: A | Discharge status: Home / Self Care | Payer: BLUE CROSS/BLUE SHIELD | Source: Ambulatory Visit | Attending: Obstetrics & Gynecology | Admitting: Obstetrics & Gynecology

## 2016-11-06 ENCOUNTER — Encounter (HOSPITAL_COMMUNITY)
Admission: RE | Disposition: A | Discharge status: Home / Self Care | Payer: Self-pay | Source: Ambulatory Visit | Attending: Obstetrics & Gynecology

## 2016-11-06 ENCOUNTER — Encounter (HOSPITAL_COMMUNITY): Payer: Self-pay | Admitting: *Deleted

## 2016-11-06 DIAGNOSIS — N950 Postmenopausal bleeding: Secondary | ICD-10-CM | POA: Diagnosis not present

## 2016-11-06 DIAGNOSIS — D649 Anemia, unspecified: Secondary | ICD-10-CM | POA: Diagnosis not present

## 2016-11-06 DIAGNOSIS — N83291 Other ovarian cyst, right side: Secondary | ICD-10-CM | POA: Diagnosis not present

## 2016-11-06 DIAGNOSIS — Z9049 Acquired absence of other specified parts of digestive tract: Secondary | ICD-10-CM | POA: Diagnosis not present

## 2016-11-06 DIAGNOSIS — E785 Hyperlipidemia, unspecified: Secondary | ICD-10-CM | POA: Diagnosis present

## 2016-11-06 DIAGNOSIS — D259 Leiomyoma of uterus, unspecified: Secondary | ICD-10-CM | POA: Diagnosis not present

## 2016-11-06 DIAGNOSIS — M545 Low back pain: Secondary | ICD-10-CM | POA: Diagnosis present

## 2016-11-06 DIAGNOSIS — N838 Other noninflammatory disorders of ovary, fallopian tube and broad ligament: Secondary | ICD-10-CM | POA: Diagnosis not present

## 2016-11-06 DIAGNOSIS — K219 Gastro-esophageal reflux disease without esophagitis: Secondary | ICD-10-CM | POA: Diagnosis present

## 2016-11-06 DIAGNOSIS — D250 Submucous leiomyoma of uterus: Secondary | ICD-10-CM | POA: Diagnosis not present

## 2016-11-06 DIAGNOSIS — Z90711 Acquired absence of uterus with remaining cervical stump: Secondary | ICD-10-CM | POA: Diagnosis present

## 2016-11-06 DIAGNOSIS — R109 Unspecified abdominal pain: Secondary | ICD-10-CM | POA: Diagnosis not present

## 2016-11-06 DIAGNOSIS — N8312 Corpus luteum cyst of left ovary: Secondary | ICD-10-CM | POA: Diagnosis not present

## 2016-11-06 DIAGNOSIS — N83201 Unspecified ovarian cyst, right side: Secondary | ICD-10-CM | POA: Diagnosis not present

## 2016-11-06 DIAGNOSIS — D251 Intramural leiomyoma of uterus: Secondary | ICD-10-CM | POA: Diagnosis not present

## 2016-11-06 HISTORY — PX: SALPINGOOPHORECTOMY: SHX82

## 2016-11-06 HISTORY — PX: SUPRACERVICAL ABDOMINAL HYSTERECTOMY: SHX5393

## 2016-11-06 LAB — GLUCOSE, CAPILLARY: Glucose-Capillary: 105 mg/dL — ABNORMAL HIGH (ref 65–99)

## 2016-11-06 SURGERY — HYSTERECTOMY, SUPRACERVICAL, ABDOMINAL
Anesthesia: General

## 2016-11-06 MED ORDER — GABAPENTIN 300 MG PO CAPS: 300.0000 mg | ORAL_CAPSULE | Freq: Every day | ORAL | Status: DC | PRN

## 2016-11-06 MED ORDER — ONDANSETRON HCL 4 MG/2ML IJ SOLN
INTRAMUSCULAR | Status: AC
  Filled 2016-11-06: qty 2

## 2016-11-06 MED ORDER — ROCURONIUM BROMIDE 50 MG/5ML IV SOLN
INTRAVENOUS | Status: AC
  Filled 2016-11-06: qty 1

## 2016-11-06 MED ORDER — MELOXICAM 7.5 MG PO TABS
7.5000 mg | ORAL_TABLET | Freq: Every day | ORAL | Status: DC
  Administered 2016-11-06: 7.5 mg via ORAL
  Filled 2016-11-06 (×3): qty 1

## 2016-11-06 MED ORDER — KETOROLAC TROMETHAMINE 30 MG/ML IJ SOLN
INTRAMUSCULAR | Status: AC
  Filled 2016-11-06: qty 1

## 2016-11-06 MED ORDER — 0.9 % SODIUM CHLORIDE (POUR BTL) OPTIME
TOPICAL | Status: DC | PRN
  Administered 2016-11-06: 2000 mL

## 2016-11-06 MED ORDER — FENTANYL CITRATE (PF) 100 MCG/2ML IJ SOLN
INTRAMUSCULAR | Status: DC | PRN
  Administered 2016-11-06: 100 ug via INTRAVENOUS
  Administered 2016-11-06 (×2): 50 ug via INTRAVENOUS

## 2016-11-06 MED ORDER — ATROPINE SULFATE 1 MG/ML IJ SOLN
0.4000 mg | Freq: Once | INTRAMUSCULAR | Status: AC
  Administered 2016-11-06: 0.4 mg via INTRAVENOUS

## 2016-11-06 MED ORDER — HYDROMORPHONE HCL 1 MG/ML IJ SOLN
0.2500 mg | INTRAMUSCULAR | Status: DC | PRN
  Administered 2016-11-06 (×3): 0.5 mg via INTRAVENOUS
  Filled 2016-11-06: qty 1

## 2016-11-06 MED ORDER — SIMETHICONE 80 MG PO CHEW: 80.0000 mg | CHEWABLE_TABLET | Freq: Four times a day (QID) | ORAL | Status: DC | PRN

## 2016-11-06 MED ORDER — PROPOFOL 10 MG/ML IV BOLUS
INTRAVENOUS | Status: DC | PRN
  Administered 2016-11-06: 130 mg via INTRAVENOUS

## 2016-11-06 MED ORDER — ALUM & MAG HYDROXIDE-SIMETH 200-200-20 MG/5ML PO SUSP: 30.0000 mL | ORAL | Status: DC | PRN

## 2016-11-06 MED ORDER — SENNOSIDES-DOCUSATE SODIUM 8.6-50 MG PO TABS: 1.0000 | ORAL_TABLET | Freq: Every evening | ORAL | Status: DC | PRN

## 2016-11-06 MED ORDER — LIDOCAINE HCL (PF) 1 % IJ SOLN
INTRAMUSCULAR | Status: AC
  Filled 2016-11-06: qty 5

## 2016-11-06 MED ORDER — BISACODYL 10 MG RE SUPP: 10.0000 mg | Freq: Every day | RECTAL | Status: DC | PRN

## 2016-11-06 MED ORDER — LACTATED RINGERS IV SOLN
INTRAVENOUS | Status: DC
  Administered 2016-11-06 (×3): via INTRAVENOUS

## 2016-11-06 MED ORDER — CEFAZOLIN SODIUM-DEXTROSE 2-4 GM/100ML-% IV SOLN
2.0000 g | INTRAVENOUS | Status: AC
  Administered 2016-11-06: 2 g via INTRAVENOUS
  Filled 2016-11-06: qty 100

## 2016-11-06 MED ORDER — ATROPINE SULFATE 1 MG/ML IJ SOLN
INTRAMUSCULAR | Status: AC
  Filled 2016-11-06: qty 1

## 2016-11-06 MED ORDER — MIDAZOLAM HCL 2 MG/2ML IJ SOLN
INTRAMUSCULAR | Status: AC
  Filled 2016-11-06: qty 2

## 2016-11-06 MED ORDER — PROPOFOL 10 MG/ML IV BOLUS
INTRAVENOUS | Status: AC
  Filled 2016-11-06: qty 40

## 2016-11-06 MED ORDER — ONDANSETRON HCL 4 MG PO TABS: 8.0000 mg | ORAL_TABLET | Freq: Four times a day (QID) | ORAL | Status: DC | PRN

## 2016-11-06 MED ORDER — KCL IN DEXTROSE-NACL 20-5-0.45 MEQ/L-%-% IV SOLN
INTRAVENOUS | Status: DC
  Administered 2016-11-06: 19:00:00 via INTRAVENOUS

## 2016-11-06 MED ORDER — GLYCOPYRROLATE 0.2 MG/ML IJ SOLN
INTRAMUSCULAR | Status: DC | PRN
  Administered 2016-11-06: 0.4 mg via INTRAVENOUS
  Administered 2016-11-06: 0.2 mg via INTRAVENOUS

## 2016-11-06 MED ORDER — GLYCOPYRROLATE 0.2 MG/ML IJ SOLN
INTRAMUSCULAR | Status: AC
  Filled 2016-11-06: qty 1

## 2016-11-06 MED ORDER — ATROPINE SULFATE 0.4 MG/ML IJ SOLN
INTRAMUSCULAR | Status: DC | PRN
  Administered 2016-11-06 (×2): 0.2 mg via INTRAVENOUS

## 2016-11-06 MED ORDER — ZOLPIDEM TARTRATE 5 MG PO TABS: 5.0000 mg | ORAL_TABLET | Freq: Every evening | ORAL | Status: DC | PRN

## 2016-11-06 MED ORDER — ROCURONIUM BROMIDE 100 MG/10ML IV SOLN
INTRAVENOUS | Status: DC | PRN
  Administered 2016-11-06: 40 mg via INTRAVENOUS
  Administered 2016-11-06: 10 mg via INTRAVENOUS

## 2016-11-06 MED ORDER — SEVOFLURANE IN SOLN
RESPIRATORY_TRACT | Status: AC
  Filled 2016-11-06: qty 250

## 2016-11-06 MED ORDER — NEOSTIGMINE METHYLSULFATE 10 MG/10ML IV SOLN
INTRAVENOUS | Status: DC | PRN
  Administered 2016-11-06 (×2): 2 mg via INTRAVENOUS

## 2016-11-06 MED ORDER — KETOROLAC TROMETHAMINE 30 MG/ML IJ SOLN
30.0000 mg | Freq: Once | INTRAMUSCULAR | Status: AC
  Administered 2016-11-06: 30 mg via INTRAVENOUS
  Filled 2016-11-06: qty 1

## 2016-11-06 MED ORDER — BUPIVACAINE LIPOSOME 1.3 % IJ SUSP
INTRAMUSCULAR | Status: DC | PRN
  Administered 2016-11-06: 20 mL

## 2016-11-06 MED ORDER — BUPIVACAINE LIPOSOME 1.3 % IJ SUSP
INTRAMUSCULAR | Status: AC
  Filled 2016-11-06: qty 20

## 2016-11-06 MED ORDER — SODIUM CHLORIDE 0.9 % IV SOLN
8.0000 mg | Freq: Four times a day (QID) | INTRAVENOUS | Status: DC | PRN
  Filled 2016-11-06: qty 4

## 2016-11-06 MED ORDER — HYDROMORPHONE HCL 1 MG/ML IJ SOLN
1.0000 mg | INTRAMUSCULAR | Status: DC | PRN
  Administered 2016-11-06 – 2016-11-07 (×2): 1 mg via INTRAVENOUS
  Filled 2016-11-06 (×2): qty 1

## 2016-11-06 MED ORDER — FENTANYL CITRATE (PF) 250 MCG/5ML IJ SOLN
INTRAMUSCULAR | Status: AC
  Filled 2016-11-06: qty 5

## 2016-11-06 MED ORDER — MIDAZOLAM HCL 5 MG/5ML IJ SOLN
INTRAMUSCULAR | Status: DC | PRN
  Administered 2016-11-06: 2 mg via INTRAVENOUS

## 2016-11-06 MED ORDER — CEFAZOLIN SODIUM-DEXTROSE 2-4 GM/100ML-% IV SOLN
INTRAVENOUS | Status: AC
  Filled 2016-11-06: qty 100

## 2016-11-06 MED ORDER — DOCUSATE SODIUM 100 MG PO CAPS
100.0000 mg | ORAL_CAPSULE | Freq: Two times a day (BID) | ORAL | Status: DC
  Administered 2016-11-06 – 2016-11-07 (×3): 100 mg via ORAL
  Filled 2016-11-06 (×3): qty 1

## 2016-11-06 MED ORDER — ATROPINE SULFATE 0.4 MG/ML IJ SOLN
INTRAMUSCULAR | Status: AC
  Filled 2016-11-06: qty 1

## 2016-11-06 MED ORDER — GLYCOPYRROLATE 0.2 MG/ML IJ SOLN
INTRAMUSCULAR | Status: AC
Start: 2016-11-06 — End: 2016-11-06
  Filled 2016-11-06: qty 2

## 2016-11-06 MED ORDER — ONDANSETRON HCL 4 MG/2ML IJ SOLN
4.0000 mg | Freq: Once | INTRAMUSCULAR | Status: AC
  Administered 2016-11-06: 4 mg via INTRAVENOUS

## 2016-11-06 MED ORDER — NEOSTIGMINE METHYLSULFATE 10 MG/10ML IV SOLN
INTRAVENOUS | Status: AC
  Filled 2016-11-06: qty 1

## 2016-11-06 MED ORDER — ATROPINE SULFATE 0.4 MG/ML IJ SOLN
0.4000 mg | Freq: Once | INTRAMUSCULAR | Status: AC
  Filled 2016-11-06: qty 1

## 2016-11-06 MED ORDER — CYCLOBENZAPRINE HCL 10 MG PO TABS: 5.0000 mg | ORAL_TABLET | Freq: Three times a day (TID) | ORAL | Status: DC | PRN

## 2016-11-06 MED ORDER — SUCCINYLCHOLINE CHLORIDE 20 MG/ML IJ SOLN
INTRAMUSCULAR | Status: AC
  Filled 2016-11-06: qty 1

## 2016-11-06 MED ORDER — MIDAZOLAM HCL 2 MG/2ML IJ SOLN
1.0000 mg | INTRAMUSCULAR | Status: DC
  Administered 2016-11-06: 2 mg via INTRAVENOUS

## 2016-11-06 MED ORDER — OXYCODONE-ACETAMINOPHEN 5-325 MG PO TABS
1.0000 | ORAL_TABLET | ORAL | Status: DC | PRN
  Administered 2016-11-07: 1 via ORAL
  Filled 2016-11-06: qty 1

## 2016-11-06 MED ORDER — HYDROMORPHONE HCL 1 MG/ML IJ SOLN
INTRAMUSCULAR | Status: AC
  Filled 2016-11-06: qty 1

## 2016-11-06 MED ORDER — BUPIVACAINE LIPOSOME 1.3 % IJ SUSP
20.0000 mL | Freq: Once | INTRAMUSCULAR | Status: DC
  Filled 2016-11-06: qty 20

## 2016-11-06 MED ORDER — LIDOCAINE HCL 1 % IJ SOLN
INTRAMUSCULAR | Status: DC | PRN
  Administered 2016-11-06: 25 mg via INTRADERMAL

## 2016-11-06 SURGICAL SUPPLY — 51 items
APPLIER CLIP 13 LRG OPEN (CLIP)
BAG HAMPER (MISCELLANEOUS) ×3 IMPLANT
BLADE SURG SZ10 CARB STEEL (BLADE) ×3 IMPLANT
CELLS DAT CNTRL 66122 CELL SVR (MISCELLANEOUS) IMPLANT
CLIP APPLIE 13 LRG OPEN (CLIP) IMPLANT
CLOTH BEACON ORANGE TIMEOUT ST (SAFETY) ×3 IMPLANT
COVER LIGHT HANDLE STERIS (MISCELLANEOUS) ×6 IMPLANT
DERMABOND ADVANCED (GAUZE/BANDAGES/DRESSINGS) ×1
DERMABOND ADVANCED .7 DNX12 (GAUZE/BANDAGES/DRESSINGS) ×2 IMPLANT
DRAPE WARM FLUID 44X44 (DRAPE) ×3 IMPLANT
DRSG OPSITE POSTOP 4X8 (GAUZE/BANDAGES/DRESSINGS) ×3 IMPLANT
ELECT CAUTERY BLADE 6.4 (BLADE) ×3 IMPLANT
ELECT REM PT RETURN 9FT ADLT (ELECTROSURGICAL) ×3
ELECTRODE REM PT RTRN 9FT ADLT (ELECTROSURGICAL) ×2 IMPLANT
FORMALIN 10 PREFIL 480ML (MISCELLANEOUS) ×3 IMPLANT
GLOVE BIOGEL PI IND STRL 7.0 (GLOVE) ×2 IMPLANT
GLOVE BIOGEL PI IND STRL 8 (GLOVE) ×2 IMPLANT
GLOVE BIOGEL PI INDICATOR 7.0 (GLOVE) ×1
GLOVE BIOGEL PI INDICATOR 8 (GLOVE) ×1
GLOVE ECLIPSE 8.0 STRL XLNG CF (GLOVE) ×6 IMPLANT
GOWN STRL REUS W/ TWL LRG LVL3 (GOWN DISPOSABLE) ×4 IMPLANT
GOWN STRL REUS W/TWL LRG LVL3 (GOWN DISPOSABLE) ×5 IMPLANT
GOWN STRL REUS W/TWL XL LVL3 (GOWN DISPOSABLE) ×3 IMPLANT
INST SET MAJOR GENERAL (KITS) ×3 IMPLANT
KIT ROOM TURNOVER APOR (KITS) ×3 IMPLANT
LIQUID BAND (GAUZE/BANDAGES/DRESSINGS) IMPLANT
MANIFOLD NEPTUNE II (INSTRUMENTS) ×3 IMPLANT
NEEDLE HYPO 21X1.5 SAFETY (NEEDLE) ×3 IMPLANT
NS IRRIG 1000ML POUR BTL (IV SOLUTION) ×6 IMPLANT
PACK ABDOMINAL MAJOR (CUSTOM PROCEDURE TRAY) ×3 IMPLANT
PAD ARMBOARD 7.5X6 YLW CONV (MISCELLANEOUS) ×3 IMPLANT
PENCIL HANDSWITCHING (ELECTRODE) ×3 IMPLANT
RETRACTOR WND ALEXIS 25 LRG (MISCELLANEOUS) ×2 IMPLANT
RTRCTR WOUND ALEXIS 18CM MED (MISCELLANEOUS)
RTRCTR WOUND ALEXIS 25CM LRG (MISCELLANEOUS) ×3
SET BASIN LINEN APH (SET/KITS/TRAYS/PACK) ×3 IMPLANT
STAPLER VISISTAT 35W (STAPLE) ×3 IMPLANT
SUT CHROMIC 0 CT 1 (SUTURE) ×3 IMPLANT
SUT MNCRL+ AB 3-0 CT1 36 (SUTURE) IMPLANT
SUT MON AB 3-0 SH 27 (SUTURE) IMPLANT
SUT MONOCRYL AB 3-0 CT1 36IN (SUTURE)
SUT PLAIN 2 0 XLH (SUTURE) IMPLANT
SUT VIC AB 0 CT1 27 (SUTURE) ×3
SUT VIC AB 0 CT1 27XBRD ANTBC (SUTURE) IMPLANT
SUT VIC AB 0 CT1 27XCR 8 STRN (SUTURE) ×6 IMPLANT
SUT VIC AB 0 CTX 36 (SUTURE) ×1
SUT VIC AB 0 CTX36XBRD ANTBCTR (SUTURE) ×2 IMPLANT
SUT VICRYL 3 0 (SUTURE) ×3 IMPLANT
SYR 20CC LL (SYRINGE) ×3 IMPLANT
TOWEL BLUE STERILE X RAY DET (MISCELLANEOUS) IMPLANT
TRAY FOLEY W/METER SILVER 16FR (SET/KITS/TRAYS/PACK) ×3 IMPLANT

## 2016-11-06 NOTE — Interval H&P Note (Signed)
History and Physical Interval Note:  11/06/2016 9:46 AM  Jacqueline [name]  has presented today for surgery, with the diagnosis of 20 week size fibroid uterus; abdominal pain  The various methods of treatment have been discussed with the patient and family. After consideration of risks, benefits and other options for treatment, the patient has consented to  Procedure(s): HYSTERECTOMY SUPRACERVICAL ABDOMINAL (N/A) BILATERAL SALPINGO OOPHORECTOMY (Bilateral) as a surgical intervention .  The patient's history has been reviewed, patient examined, no change in status, stable for surgery.  I have reviewed the patient's chart and labs.  Questions were answered to the patient's satisfaction.     EURE,LUTHER H

## 2016-11-06 NOTE — Anesthesia Procedure Notes (Signed)
Procedure Name: Intubation Date/Time: 11/06/2016 10:02 AM Performed by: Charmaine Downs Pre-anesthesia Checklist: Patient identified, Patient being monitored, Timeout performed, Emergency Drugs available and Suction available Patient Re-evaluated:Patient Re-evaluated prior to inductionOxygen Delivery Method: Circle System Utilized Preoxygenation: Pre-oxygenation with 100% oxygen Intubation Type: IV induction and Cricoid Pressure applied Ventilation: Mask ventilation without difficulty Laryngoscope Size: Mac and 3 Grade View: Grade II Tube type: Oral Tube size: 7.0 mm Number of attempts: 1 Airway Equipment and Method: stylet Placement Confirmation: ETT inserted through vocal cords under direct vision,  positive ETCO2 and breath sounds checked- equal and bilateral Secured at: 22 cm Tube secured with: Tape Dental Injury: Teeth and Oropharynx as per pre-operative assessment

## 2016-11-06 NOTE — Anesthesia Preprocedure Evaluation (Signed)
Anesthesia Evaluation  Patient identified by MRN, date of birth, ID band Patient awake    Reviewed: Allergy & Precautions, NPO status , Patient's Chart, lab work & pertinent test results  Airway Mallampati: I  TM Distance: >3 FB     Dental  (+) Teeth Intact, Missing   Pulmonary neg pulmonary ROS,    breath sounds clear to auscultation       Cardiovascular negative cardio ROS   Rhythm:Regular Rate:Normal     Neuro/Psych negative neurological ROS     GI/Hepatic GERD  Controlled and Medicated,  Endo/Other  diabetes (borderline. no meds), Type 2  Renal/GU      Musculoskeletal  (+) Arthritis ,   Abdominal   Peds  Hematology  (+) anemia ,   Anesthesia Other Findings   Reproductive/Obstetrics                             Anesthesia Physical Anesthesia Plan  ASA: II  Anesthesia Plan: General   Post-op Pain Management:    Induction: Intravenous, Rapid sequence and Cricoid pressure planned  Airway Management Planned: Oral ETT  Additional Equipment:   Intra-op Plan:   Post-operative Plan: Extubation in OR  Informed Consent: I have reviewed the patients History and Physical, chart, labs and discussed the procedure including the risks, benefits and alternatives for the proposed anesthesia with the patient or authorized representative who has indicated his/her understanding and acceptance.     Plan Discussed with:   Anesthesia Plan Comments:         Anesthesia Quick Evaluation

## 2016-11-06 NOTE — Op Note (Signed)
Preoperative diagnosis:  1.  20 week size fibroid uterus                                          2.  Multiple fibroids                                         3.  Post menopausal bleeding                                         4.  Pelvic and low back pain  Postoperative diagnosis:  Same as above   Procedure:  Abdominal hysterectomy, supracervical with removal of both tubes and ovaries  Surgeon:  Florian Buff  Assistant:    Anesthesia:  General endotracheal  Preoperative clinical summary:  Jacqueline J Tayloris a 56 y.o.G3P3 with No LMP recorded. Patient is not currently having periods (Reason: Perimenopausal).admitted for a supracervical hysterectomy and removal of both tubes and ovaries due to 20 week size fibroid uterus associated with back, pelvic and lower abdominal pain and post menopausal bleeding. Cannot perform a pre operative endometrial biopsy because of the distortion of the position of the cervix due to the the fibroid uterus  Intraoperative findings: enlarged fibroid uterus, nultiple fibroids, ovaries with post menopausal appearance  Description of operation:  Patient was taken to the operating room and placed in the supine position where she underwent general endotracheal anesthesia.  She was then prepped and draped in the usual sterile fashion and a Foley catheter was placed for continuous bladder drainage.  A Pfannenstiel skin incision was made and carried down sharply to the rectus fascia which was scored in the midline and extended laterally.  The fascia was taken off the muscles superiorly and inferiorly without difficulty.  The muscles were divided.  The peritoneal cavity was entered.  An medium Alexis self-retaining retractor was placed.  The upper abdomen was packed away. Both uterine cornu were grasped with Coker clamps.  The left round ligament was suture ligated and coagulated with the electrocautery unit.  The left vesicouterine serosal flap was created.  An avascular  window in in the peritoneum was created and the utero-ovarian ligament was cross clamped, cut and suture ligated.  The right round ligament was suture ligated and cut with the electrocautery unit.  The vesicouterine serosal flap on the right was created.  An avascular window in the peritoneum was created and the right utero-ovarian ligament was cross clamped, cut and double suture ligated.    The uterine vessels were skeletonized bilaterally.  The uterine vessels were clamped bilaterally,  then cut and suture ligated.  Two more pedicles were taken down the cervix medial to the uterine vessels.  Each pedicle was clamped cut and suture ligated with good resulting hemostasis.  As per the preoperative plan the cervix was then transected sharply and the specimen was removed.  The cervical stump was then closed anterior to posterior for hemostasis and reduce postoperative adhesions.  The pelvis was irrigated vigorously and all pedicles were examined and found to be hemostatic.  The right infundibulo pelvic ligament was then cross clamped and double suture ligated, thus removing the right tube and ovary.  The left  infundibulo pelvic ligament was then clamped cut and double suture ligated, thus removing the left tube and ovary. .  All specimens were sent seperately to pathology for routine evaluation.  The Alexis self-retaining retractor was removed and the pelvis was irrigated vigorously.  All packs were removed and all counts were correct at this point x 3.  The muscles and peritoneum were reapproximated loosely.  The fascia was closed with 0 Vicryl running.    The skin was closed using 3-0 Vicryl on a Keith needle in a subcuticular fashion.  Dermabond was then applied for additional wound integrity and to serve as a postoperative bacterial barrier.  The patient was awakened from anesthesia taken to the recovery room in good stable condition. All sponge instrument and needle counts were correct x 3.  The patient received  Ancef and Toradol prophylactically preoperatively.  Estimated blood loss for the procedure was 200  cc.  EURE,LUTHER H 11/06/2016 11:57 AM  10:04 AM

## 2016-11-06 NOTE — Transfer of Care (Signed)
Immediate Anesthesia Transfer of Care Note  Patient: Jacqueline [name]  Procedure(s) Performed: Procedure(s): HYSTERECTOMY SUPRACERVICAL ABDOMINAL (N/A) BILATERAL SALPINGO OOPHORECTOMY (Bilateral)  Patient Location: PACU  Anesthesia Type:General  Level of Consciousness: drowsy and patient cooperative  Airway & Oxygen Therapy: Patient Spontanous Breathing and Patient connected to face mask oxygen  Post-op Assessment: Report given to RN, Post -op Vital signs reviewed and stable and Patient moving all extremities  Post vital signs: Reviewed and stable  Last Vitals:  Vitals:   11/06/16 0945 11/06/16 0950  BP: (!) 145/93 118/83  Pulse:    Resp: (!) 56 19  Temp:      Last Pain:  Vitals:   11/06/16 0856  TempSrc: Oral  PainSc: 2       Patients Stated Pain Goal: 7 (41/58/30 9407)  Complications: No apparent anesthesia complications

## 2016-11-06 NOTE — H&P (Signed)
Preoperative History and Physical  Jacqueline [name] is a 56 y.o. G3P3 with No LMP recorded. Patient is not currently having periods (Reason: Perimenopausal). admitted for a supracervical hysterectomy and removal of both tubes and ovaries due to 20 week size fibroid uterus associated with back, pelvic and lower abdominal pain and post menopausal bleeding.  Cannot perform a pre operative endometrial biopsy because of the distortion of the position of the cervix due to the the fibroid uterus  PMH:        Past Medical History:  Diagnosis Date  . Anemia   . Fibroid uterus   . GERD (gastroesophageal reflux disease)   . Hyperlipidemia    Borderline  . Pre-diabetes    borderline    PSH:          Past Surgical History:  Procedure Laterality Date  . CHOLECYSTECTOMY    . COLONOSCOPY  2003   Dr. Gala Romney: Anal canal hemorrhoids  . COLONOSCOPY N/A 08/25/2014   Procedure: COLONOSCOPY;  Surgeon: Daneil Dolin, MD;  Location: [facility];  Service: Endoscopy;  Laterality: N/A;  1200  . ESOPHAGOGASTRODUODENOSCOPY N/A 08/25/2014   Procedure: ESOPHAGOGASTRODUODENOSCOPY (EGD);  Surgeon: Daneil Dolin, MD;  Location: [facility];  Service: Endoscopy;  Laterality: N/A;  . TUBAL LIGATION      POb/GynH:              OB History    Gravida Para Term Preterm AB Living   3 3           SAB TAB Ectopic Multiple Live Births                  SH:         Social History  Substance Use Topics  . Smoking status: Never Smoker  . Smokeless tobacco: Never Used     Comment: Never smoked  . Alcohol use No    FH:         Family History  Problem Relation Age of Onset  . Cancer Maternal Grandmother         lymphnodes  . Hypertension Maternal Aunt   . Cancer Maternal Aunt        Breast  . Colon cancer Neg Hx      Allergies: No Known Allergies  Medications:       Current Outpatient Prescriptions:  .  cyclobenzaprine (FLEXERIL) 5 MG tablet, Take 1  tablet (5 mg total) by mouth 3 (three) times daily as needed for muscle spasms., Disp: 30 tablet, Rfl: 1 .  HYDROcodone-acetaminophen (NORCO) 5-325 MG tablet, Take 1 tablet by mouth every 6 (six) hours as needed for moderate pain. (Patient taking differently: Take 1 tablet by mouth daily as needed for moderate pain. ), Disp: 20 tablet, Rfl: 0 .  Multiple Vitamin (MULTIVITAMIN) tablet, Take 1 tablet by mouth daily., Disp: , Rfl:  .  Chlorpheniramine Maleate (ALLERGY PO), Take 1 tablet by mouth daily as needed (allergies)., Disp: , Rfl:  .  gabapentin (NEURONTIN) 300 MG capsule, Take 300 mg by mouth daily as needed (pain)., Disp: , Rfl:  .  meloxicam (MOBIC) 7.5 MG tablet, Take 1 tablet (7.5 mg total) by mouth daily., Disp: 30 tablet, Rfl: 0 .  Menthol-Methyl Salicylate (MUSCLE RUB) 10-15 % CREA, Apply 1 application topically as needed for muscle pain., Disp: , Rfl:   Review of Systems:   Review of Systems  Constitutional: Negative for fever, chills, weight loss, malaise/fatigue and diaphoresis.  HENT: Negative for  hearing loss, ear pain, nosebleeds, congestion, sore throat, neck pain, tinnitus and ear discharge.   Eyes: Negative for blurred vision, double vision, photophobia, pain, discharge and redness.  Respiratory: Negative for cough, hemoptysis, sputum production, shortness of breath, wheezing and stridor.   Cardiovascular: Negative for chest pain, palpitations, orthopnea, claudication, leg swelling and PND.  Gastrointestinal: Positive for abdominal pain. Negative for heartburn, nausea, vomiting, diarrhea, constipation, blood in stool and melena.  Genitourinary: Negative for dysuria, urgency, frequency, hematuria and flank pain.  Musculoskeletal: Negative for myalgias, back pain, joint pain and falls.  Skin: Negative for itching and rash.  Neurological: Negative for dizziness, tingling, tremors, sensory change, speech change, focal weakness, seizures, loss of consciousness, weakness and  headaches.  Endo/Heme/Allergies: Negative for environmental allergies and polydipsia. Does not bruise/bleed easily.  Psychiatric/Behavioral: Negative for depression, suicidal ideas, hallucinations, memory loss and substance abuse. The patient is not nervous/anxious and does not have insomnia.      PHYSICAL EXAM:  Blood pressure 120/80, pulse 74, weight 173 lb (78.5 kg).    Vitals reviewed. Constitutional: She is oriented to person, place, and time. She appears well-developed and well-nourished.  HENT:  Head: Normocephalic and atraumatic.  Right Ear: External ear normal.  Left Ear: External ear normal.  Nose: Nose normal.  Mouth/Throat: Oropharynx is clear and moist.  Eyes: Conjunctivae and EOM are normal. Pupils are equal, round, and reactive to light. Right eye exhibits no discharge. Left eye exhibits no discharge. No scleral icterus.  Neck: Normal range of motion. Neck supple. No tracheal deviation present. No thyromegaly present.  Cardiovascular: Normal rate, regular rhythm, normal heart sounds and intact distal pulses.  Exam reveals no gallop and no friction rub.   No murmur heard. Respiratory: Effort normal and breath sounds normal. No respiratory distress. She has no wheezes. She has no rales. She exhibits no tenderness.  GI: Soft. Bowel sounds are normal. She exhibits no distension and no mass. There is tenderness. There is no rebound and no guarding.  Genitourinary:       Vulva is normal without lesions Vagina is pink moist without discharge Cervix normal in appearance and pap is normal Uterus is 20 weeks size Adnexa is negative with normal sized ovaries by sonogram  Musculoskeletal: Normal range of motion. She exhibits no edema and no tenderness.  Neurological: She is alert and oriented to person, place, and time. She has normal reflexes. She displays normal reflexes. No cranial nerve deficit. She exhibits normal muscle tone. Coordination normal.  Skin: Skin is warm and  dry. No rash noted. No erythema. No pallor.  Psychiatric: She has a normal mood and affect. Her behavior is normal. Judgment and thought content normal.    Labs:       Results for orders placed or performed during the hospital encounter of 10/31/16 (from the past 336 hour(s))  Surgical pcr screen   Collection Time: 10/31/16  2:52 PM  Result Value Ref Range   MRSA, PCR NEGATIVE NEGATIVE   Staphylococcus aureus NEGATIVE NEGATIVE  CBC   Collection Time: 10/31/16  2:52 PM  Result Value Ref Range   WBC 5.4 4.0 - 10.5 K/uL   RBC 3.60 (L) 3.87 - 5.11 MIL/uL   Hemoglobin 11.4 (L) 12.0 - 15.0 g/dL   HCT 33.3 (L) 36.0 - 46.0 %   MCV 92.5 78.0 - 100.0 fL   MCH 31.7 26.0 - 34.0 pg   MCHC 34.2 30.0 - 36.0 g/dL   RDW 13.4 11.5 - 15.5 %  Platelets 197 150 - 400 K/uL  Comprehensive metabolic panel   Collection Time: 10/31/16  2:52 PM  Result Value Ref Range   Sodium 136 135 - 145 mmol/L   Potassium 3.9 3.5 - 5.1 mmol/L   Chloride 105 101 - 111 mmol/L   CO2 26 22 - 32 mmol/L   Glucose, Bld 111 (H) 65 - 99 mg/dL   BUN 14 6 - 20 mg/dL   Creatinine, Ser 0.92 0.44 - 1.00 mg/dL   Calcium 8.8 (L) 8.9 - 10.3 mg/dL   Total Protein 6.7 6.5 - 8.1 g/dL   Albumin 3.7 3.5 - 5.0 g/dL   AST 28 15 - 41 U/L   ALT 17 14 - 54 U/L   Alkaline Phosphatase 76 38 - 126 U/L   Total Bilirubin 0.4 0.3 - 1.2 mg/dL   GFR calc non Af Amer >60 >60 mL/min   GFR calc Af Amer >60 >60 mL/min   Anion gap 5 5 - 15  hCG, quantitative, pregnancy   Collection Time: 10/31/16  2:52 PM  Result Value Ref Range   hCG, Beta Chain, Quant, S 2 <5 mIU/mL  Rapid HIV screen (HIV 1/2 Ab+Ag)   Collection Time: 10/31/16  2:52 PM  Result Value Ref Range   HIV-1 P24 Antigen - HIV24 NON REACTIVE NON REACTIVE   HIV 1/2 Antibodies NON REACTIVE NON REACTIVE   Interpretation (HIV Ag Ab)      A non reactive test result means that HIV 1 or HIV 2 antibodies and HIV 1 p24 antigen were not detected in  the specimen.  Urinalysis, Routine w reflex microscopic   Collection Time: 10/31/16  2:52 PM  Result Value Ref Range   Color, Urine YELLOW YELLOW   APPearance CLEAR CLEAR   Specific Gravity, Urine 1.023 1.005 - 1.030   pH 6.0 5.0 - 8.0   Glucose, UA NEGATIVE NEGATIVE mg/dL   Hgb urine dipstick MODERATE (A) NEGATIVE   Bilirubin Urine NEGATIVE NEGATIVE   Ketones, ur NEGATIVE NEGATIVE mg/dL   Protein, ur NEGATIVE NEGATIVE mg/dL   Nitrite NEGATIVE NEGATIVE   Leukocytes, UA NEGATIVE NEGATIVE   RBC / HPF 0-5 0 - 5 RBC/hpf   WBC, UA 0-5 0 - 5 WBC/hpf   Bacteria, UA NONE SEEN NONE SEEN   Squamous Epithelial / LPF 0-5 (A) NONE SEEN   Mucous PRESENT   Type and screen   Collection Time: 10/31/16  2:52 PM  Result Value Ref Range   ABO/RH(D) O POS    Antibody Screen NEG    Sample Expiration 11/14/2016    Extend sample reason NO TRANSFUSIONS OR PREGNANCY IN THE PAST 3 MONTHS     EKG:    Orders placed or performed during the hospital encounter of 01/24/12  . Exercise Tolerance Test  . Exercise Tolerance Test    Imaging Studies:  Imaging Results  US Transvaginal Non-ob  Result Date: 11/05/2016 GYNECOLOGIC SONOGRAM Jacqueline [name] is a 56 y.o. G3P3 ,she is here for a pelvic sonogram for abnormal perimenopausal bleeding. Uterus                      17.4 x 7.7 x 10.6 cm,  enlarged heterogeneous anteverted uterus w/mult fibroids Endometrium          9.9 mm, distorted by a  2.8 x 2.5 x 3 cm submucosal fibroid Right ovary             2.7 x 2.5 x 2.5 cm, wnl  Left ovary                3.1 x 1.6 x 2.7 cm, wnl Fibroids                   (#1) fundal subserosal 4.5 x 4.1 x 4.8 cm,(#2) left LUS subserosal 4.3 x 4.8 x 3.6 cm,(#3) anterior right LUS 5.3 X 6.1 X 4.7 cm,(#4) 2.8 x 2.5 x 3 cm mid/ submucosal fibroid Technician Comments: PELVIC US TA/TV: enlarged heterogeneous anteverted uterus w/mult fibroids,(#1) fundal subserosal 4.5 x 4.1 x 4.8 cm,(#2) left LUS subserosal 4.3  x 4.8 x 3.6 cm,(#3) anterior right LUS 5.3 X 6.1 X 4.7 cm,(#4) 2.8 x 2.5 x 3 cm mid/ submucosal fibroid,normal ovaries bilat,right ovary seen on T/A,EEC 9.9 mm,no free fluid Silver Huguenin 10/15/2016 4:46 PM Clinical Impression and recommendations: I have reviewed the sonogram results above, combined with the patient's current clinical course, below are my impressions and any appropriate recommendations for management based on the sonographic findings. Uterine volume 739 cc due to multiple fibroids, including a submucosal myoma Endometrium is normal except for distorting myoma Both ovaries are normal EURE,LUTHER H   US Pelvis Complete  Result Date: 11/05/2016 GYNECOLOGIC SONOGRAM Jacqueline [name] is a 56 y.o. G3P3 ,she is here for a pelvic sonogram for abnormal perimenopausal bleeding. Uterus                      17.4 x 7.7 x 10.6 cm,  enlarged heterogeneous anteverted uterus w/mult fibroids Endometrium          9.9 mm, distorted by a  2.8 x 2.5 x 3 cm submucosal fibroid Right ovary             2.7 x 2.5 x 2.5 cm, wnl Left ovary                3.1 x 1.6 x 2.7 cm, wnl Fibroids                   (#1) fundal subserosal 4.5 x 4.1 x 4.8 cm,(#2) left LUS subserosal 4.3 x 4.8 x 3.6 cm,(#3) anterior right LUS 5.3 X 6.1 X 4.7 cm,(#4) 2.8 x 2.5 x 3 cm mid/ submucosal fibroid Technician Comments: PELVIC US TA/TV: enlarged heterogeneous anteverted uterus w/mult fibroids,(#1) fundal subserosal 4.5 x 4.1 x 4.8 cm,(#2) left LUS subserosal 4.3 x 4.8 x 3.6 cm,(#3) anterior right LUS 5.3 X 6.1 X 4.7 cm,(#4) 2.8 x 2.5 x 3 cm mid/ submucosal fibroid,normal ovaries bilat,right ovary seen on T/A,EEC 9.9 mm,no free fluid Silver Huguenin 10/15/2016 4:46 PM Clinical Impression and recommendations: I have reviewed the sonogram results above, combined with the patient's current clinical course, below are my impressions and any appropriate recommendations for management based on the sonographic findings. Uterine volume 739 cc due to multiple  fibroids, including a submucosal myoma Endometrium is normal except for distorting myoma Both ovaries are normal EURE,LUTHER H       Assessment: Enlarged fibroid uterus, 20 weeks size Perimenopausal bleeding Pelvic pain      Patient Active Problem List   Diagnosis Date Noted  . Colon cancer screening   . Gastroesophageal reflux disease with esophagitis   . Anemia 08/04/2014  . GERD (gastroesophageal reflux disease) 08/04/2014  . Shoulder strain 07/26/2014  . Degenerative arthritis of thumb 07/26/2014  . Hoarse voice quality 02/28/2014  . Left knee pain 02/28/2014  . Other malaise and fatigue 02/28/2014  . Hip  pain 04/15/2013  . Inflamed hair follicle 61/60/7371  . Epicondylitis, lateral (tennis elbow) 04/13/2013  . Fibroid uterus 10/05/2012  . Constipation 09/15/2012  . Vaginitis 02/02/2012  . Hyperlipidemia 11/28/2011  . Obesity 09/15/2011  . ALLERGIC RHINITIS, SEASONAL 11/13/2007    Plan: Supracervical hysterectomy with removal of both tubes and ovaries  Pt understands the risks of surgery including but not limited t  excessive bleeding requiring transfusion or reoperation, post-operative infection requiring prolonged hospitalization or re-hospitalization and antibiotic therapy, and damage to other organs including bladder, bowel, ureters and major vessels.  The patient also understands the alternative treatment options which were discussed in full.  All questions were answered.  EURE,LUTHER H 11/05/2016 1:17 PM

## 2016-11-06 NOTE — Anesthesia Postprocedure Evaluation (Signed)
Anesthesia Post Note  Patient: DACHELLE MOLZAHN  Procedure(s) Performed: Procedure(s) (LRB): HYSTERECTOMY SUPRACERVICAL ABDOMINAL (N/A) BILATERAL SALPINGO OOPHORECTOMY (Bilateral)  Patient location during evaluation: PACU Anesthesia Type: General Level of consciousness: awake Pain management: pain level controlled Vital Signs Assessment: post-procedure vital signs reviewed and stable Respiratory status: spontaneous breathing, nonlabored ventilation and respiratory function stable Cardiovascular status: blood pressure returned to baseline and bradycardic Postop Assessment: no signs of nausea or vomiting Anesthetic complications: no     Last Vitals:  Vitals:   11/06/16 1239 11/06/16 1245  BP:  114/71  Pulse: (!) 48 (!) 53  Resp: 14 17  Temp:      Last Pain:  Vitals:   11/06/16 1239  TempSrc:   PainSc: 7                  Jessicaann Overbaugh J

## 2016-11-07 ENCOUNTER — Encounter (HOSPITAL_COMMUNITY): Payer: Self-pay | Admitting: Obstetrics & Gynecology

## 2016-11-07 LAB — CBC
HCT: 29.9 % — ABNORMAL LOW (ref 36.0–46.0)
Hemoglobin: 10.1 g/dL — ABNORMAL LOW (ref 12.0–15.0)
MCH: 31.7 pg (ref 26.0–34.0)
MCHC: 33.8 g/dL (ref 30.0–36.0)
MCV: 93.7 fL (ref 78.0–100.0)
PLATELETS: 162 10*3/uL (ref 150–400)
RBC: 3.19 MIL/uL — AB (ref 3.87–5.11)
RDW: 13.8 % (ref 11.5–15.5)
WBC: 6.3 10*3/uL (ref 4.0–10.5)

## 2016-11-07 LAB — BASIC METABOLIC PANEL
ANION GAP: 5 (ref 5–15)
BUN: 10 mg/dL (ref 6–20)
CALCIUM: 8 mg/dL — AB (ref 8.9–10.3)
CO2: 26 mmol/L (ref 22–32)
Chloride: 103 mmol/L (ref 101–111)
Creatinine, Ser: 0.95 mg/dL (ref 0.44–1.00)
GFR calc Af Amer: >60 mL/min (ref >60)
Glucose, Bld: 118 mg/dL — ABNORMAL HIGH (ref 65–99)
POTASSIUM: 3.7 mmol/L (ref 3.5–5.1)
Sodium: 134 mmol/L — ABNORMAL LOW (ref 135–145)

## 2016-11-07 MED ORDER — KETOROLAC TROMETHAMINE 10 MG PO TABS: 10.0000 mg | ORAL_TABLET | Freq: Three times a day (TID) | ORAL | 0 refills | Status: DC | PRN

## 2016-11-07 MED ORDER — KETOROLAC TROMETHAMINE 30 MG/ML IJ SOLN
30.0000 mg | Freq: Once | INTRAMUSCULAR | Status: AC
Start: 2016-11-07 — End: 2016-11-07
  Administered 2016-11-07: 30 mg via INTRAVENOUS
  Filled 2016-11-07: qty 1

## 2016-11-07 MED ORDER — OXYCODONE-ACETAMINOPHEN 5-325 MG PO TABS: 1.0000 | ORAL_TABLET | ORAL | 0 refills | Status: DC | PRN

## 2016-11-07 MED ORDER — ONDANSETRON HCL 8 MG PO TABS: 8.0000 mg | ORAL_TABLET | Freq: Four times a day (QID) | ORAL | 0 refills | Status: DC | PRN

## 2016-11-07 NOTE — Addendum Note (Signed)
Addendum  created 11/07/16 0947 by Ollen Bowl, CRNA   Sign clinical note

## 2016-11-07 NOTE — Anesthesia Postprocedure Evaluation (Signed)
Anesthesia Post Note  Patient: Jacqueline [name]  Procedure(s) Performed: Procedure(s) (LRB): HYSTERECTOMY SUPRACERVICAL ABDOMINAL (N/A) BILATERAL SALPINGO OOPHORECTOMY (Bilateral)  Patient location during evaluation: Nursing Unit Anesthesia Type: General Level of consciousness: awake and alert and oriented Pain management: satisfactory to patient Vital Signs Assessment: post-procedure vital signs reviewed and stable Respiratory status: spontaneous breathing Cardiovascular status: blood pressure returned to baseline Postop Assessment: no signs of nausea or vomiting Anesthetic complications: no     Last Vitals:  Vitals:   11/06/16 2100 11/07/16 0411  BP: (!) 98/53 (!) 98/50  Pulse: (!) 52 65  Resp: 18 18  Temp: 36.6 C 36.7 C    Last Pain:  Vitals:   11/07/16 0411  TempSrc: Oral  PainSc:                  Tressie Stalker

## 2016-11-07 NOTE — Progress Notes (Signed)
Patient discharged with instructions given on medications and follow up visits, patient verbalized understanding. Prescriptions sent to Pharmacy of choice documented on AVS. Staff accompanied patient to an awaiting vehicle.

## 2016-11-07 NOTE — Care Management Note (Signed)
Case Management Note  Patient Details  Name: Jacqueline [name] MRN: [id number] Date of Birth: 08-21-1960  Subjective/Objective:                  Pt s/p abd hysterectomy. Chart reviewed for CM needs, lives alone, ind with ADL's, has PCP, transportation and insurance with drug coverage.   Action/Plan: Anticipate DC home with self care. No CM needs anticipated.   Expected Discharge Date:     11/08/2016             Expected Discharge Plan:  Home/Self Care  In-House Referral:  NA  Discharge planning Services  CM Consult  Post Acute Care Choice:  NA Choice offered to:  NA  Status of Service:  Completed, signed off  Sherald Barge, RN 11/07/2016, 9:25 AM

## 2016-11-07 NOTE — Discharge Instructions (Signed)
Abdominal Hysterectomy, Care After °This sheet gives you information about how to care for yourself after your procedure. Your health care provider may also give you more specific instructions. If you have problems or questions, contact your health care provider. °What can I expect after the procedure? °After your procedure, it is common to have: °· Pain. °· Fatigue. °· Poor appetite. °· Less interest in sex. °· Vaginal bleeding and discharge. You may need to use a sanitary napkin after this procedure. ° °Follow these instructions at home: °Bathing °· Do not take baths, swim, or use a hot tub until your health care provider approves. Ask your health care provider if you can take showers. You may only be allowed to take sponge baths for bathing. °· Keep the bandage (dressing) dry until your health care provider says it can be removed. °Incision care °· Follow instructions from your health care provider about how to take care of your incision. Make sure you: °? Wash your hands with soap and water before you change your bandage (dressing). If soap and water are not available, use hand sanitizer. °? Change your dressing as told by your health care provider. °? Leave stitches (sutures), skin glue, or adhesive strips in place. These skin closures may need to stay in place for 2 weeks or longer. If adhesive strip edges start to loosen and curl up, you may trim the loose edges. Do not remove adhesive strips completely unless your health care provider tells you to do that. °· Check your incision area every day for signs of infection. Check for: °? Redness, swelling, or pain. °? Fluid or blood. °? Warmth. °? Pus or a bad smell. °Activity °· Do gentle, daily exercises as told by your health care provider. You may be told to take short walks every day and go farther each time. °· Do not lift anything that is heavier than 10 lb (4.5 kg), or the limit that your health care provider tells you, until he or she says that it is  safe. °· Do not drive or use heavy machinery while taking prescription pain medicine. °· Do not drive for 24 hours if you were given a medicine to help you relax (sedative). °· Follow your health care provider's instructions about exercise, driving, and general activities. Ask your health care provider what activities are safe for you. °Lifestyle °· Do not douche, use tampons, or have sex for at least 6 weeks or as told by your health care provider. °· Do not drink alcohol until your health care provider approves. °· Drink enough fluid to keep your urine clear or pale yellow. °· Try to have someone at home with you for the first 1-2 weeks to help. °· Do not use any products that contain nicotine or tobacco, such as cigarettes and e-cigarettes. These can delay healing. If you need help quitting, ask your health care provider. °General instructions °· Take over-the-counter and prescription medicines only as told by your health care provider. °· Do not take aspirin or ibuprofen. These medicines can cause bleeding. °· To prevent or treat constipation while you are taking prescription pain medicine, your health care provider may recommend that you: °? Drink enough fluid to keep your urine clear or pale yellow. °? Take over-the-counter or prescription medicines. °? Eat foods that are high in fiber, such as fresh fruits and vegetables, whole grains, and beans. °? Limit foods that are high in fat and processed sugars, such as fried and sweet foods. °· Keep all   follow-up visits as told by your health care provider. This is important. °Contact a health care provider if: °· You have chills or fever. °· You have redness, swelling, or pain around your incision. °· You have fluid or blood coming from your incision. °· Your incision feels warm to the touch. °· You have pus or a bad smell coming from your incision. °· Your incision breaks open. °· You feel dizzy or light-headed. °· You have pain or bleeding when you urinate. °· You  have persistent diarrhea. °· You have persistent nausea and vomiting. °· You have abnormal vaginal discharge. °· You have a rash. °· You have any type of abnormal reaction or you develop an allergy to your medicine. °· Your pain medicine does not help. °Get help right away if: °· You have a fever and your symptoms suddenly get worse. °· You have severe abdominal pain. °· You have shortness of breath. °· You faint. °· You have pain, swelling, or redness in your leg. °· You have heavy vaginal bleeding with blood clots. °Summary °· After your procedure, it is common to have pain, fatigue and vaginal discharge. °· Do not take baths, swim, or use a hot tub until your health care provider approves. Ask your health care provider if you can take showers. You may only be allowed to take sponge baths for bathing. °· Follow your health care provider's instructions about exercise, driving, and general activities. Ask your health care provider what activities are safe for you. °· Do not lift anything that is heavier than 10 lb (4.5 kg), or the limit that your health care provider tells you, until he or she says that it is safe. °· Try to have someone at home with you for the first 1-2 weeks to help. °This information is not intended to replace advice given to you by your health care provider. Make sure you discuss any questions you have with your health care provider. °Document Released: 12/14/2004 Document Revised: 05/15/2016 Document Reviewed: 05/15/2016 °Elsevier Interactive Patient Education © 2017 [employer] ° °

## 2016-11-07 NOTE — Discharge Summary (Signed)
Physician Discharge Summary  Patient ID: Jacqueline [name] MRN: [id number] DOB/AGE: 13-Jul-1960 56 y.o.  Admit date: 11/06/2016 Discharge date: 11/07/2016  Admission Diagnoses: Enlarged fibroid uterus  Discharge Diagnoses:  Active Problems:   S/P abdominal supracervical subtotal hysterectomy   Discharged Condition: good  Hospital Course: unremarkeable  Consults: None  Significant Diagnostic Studies: labs:   Treatments: surgery: supracervical abdominal hysterectomy and removal of both tubes and ovaries  Discharge Exam: Blood pressure (!) 98/50, pulse 65, temperature 98.1 F (36.7 C), temperature source Oral, resp. rate 18, height 5\' 3"  (1.6 m), weight 175 lb (79.4 kg), last menstrual period 10/26/2016, SpO2 97 %. General appearance: alert, cooperative and no distress GI: soft, non-tender; bowel sounds normal; no masses,  no organomegaly Incision/Wound:clean dry intact  Disposition: 01-Home or Self Care  Discharge Instructions    Call MD for:  persistant nausea and vomiting    Complete by:  As directed    Call MD for:  severe uncontrolled pain    Complete by:  As directed    Call MD for:  temperature >100.4    Complete by:  As directed    Diet - low sodium heart healthy    Complete by:  As directed    Driving Restrictions    Complete by:  As directed    Do not drive until Dr Elonda Husky releases   Increase activity slowly    Complete by:  As directed    Leave dressing on - Keep it clean, dry, and intact until clinic visit    Complete by:  As directed    Lifting restrictions    Complete by:  As directed    Do not lift more than 10 pounds   Sexual Activity Restrictions    Complete by:  As directed    You gotta be kidding?     Allergies as of 11/07/2016   No Known Allergies     Medication List    STOP taking these medications   HYDROcodone-acetaminophen 5-325 MG tablet Commonly known as:  [place] these medications   ALLERGY PO Take 1 tablet by mouth daily as  needed (allergies).   cyclobenzaprine 5 MG tablet Commonly known as:  FLEXERIL Take 1 tablet (5 mg total) by mouth 3 (three) times daily as needed for muscle spasms.   gabapentin 300 MG capsule Commonly known as:  NEURONTIN Take 300 mg by mouth daily as needed (pain).   ketorolac 10 MG tablet Commonly known as:  TORADOL Take 1 tablet (10 mg total) by mouth every 8 (eight) hours as needed.   meloxicam 7.5 MG tablet Commonly known as:  MOBIC Take 1 tablet (7.5 mg total) by mouth daily.   multivitamin tablet Take 1 tablet by mouth daily.   MUSCLE RUB 10-15 % Crea Apply 1 application topically as needed for muscle pain.   ondansetron 8 MG tablet Commonly known as:  ZOFRAN Take 1 tablet (8 mg total) by mouth every 6 (six) hours as needed for nausea.   oxyCODONE-acetaminophen 5-325 MG tablet Commonly known as:  PERCOCET/ROXICET Take 1-2 tablets by mouth every 4 (four) hours as needed (moderate to severe pain (when tolerating fluids)).      Follow-up Information    Florian Buff, MD Follow up on 11/14/2016.   Specialties:  Obstetrics and Gynecology, Radiology Contact information: [place] [address] [phone]           Signed: Florian Buff 11/07/2016, 1:01 PM

## 2016-11-08 ENCOUNTER — Telehealth: Payer: Self-pay | Admitting: Obstetrics & Gynecology

## 2016-11-08 NOTE — Telephone Encounter (Signed)
Patient requesting a stool softner. Informed she could take Colace or [place] over the counter. Encouraged warm liquids as well. Verbalized understanding

## 2016-11-12 ENCOUNTER — Encounter: Payer: BLUE CROSS/BLUE SHIELD | Admitting: Obstetrics & Gynecology

## 2016-11-12 ENCOUNTER — Ambulatory Visit (INDEPENDENT_AMBULATORY_CARE_PROVIDER_SITE_OTHER): Payer: BLUE CROSS/BLUE SHIELD | Admitting: Obstetrics & Gynecology

## 2016-11-12 ENCOUNTER — Encounter: Payer: Self-pay | Admitting: Obstetrics & Gynecology

## 2016-11-12 VITALS — BP 120/60 | HR 72 | Wt 171.0 lb

## 2016-11-12 DIAGNOSIS — Z9889 Other specified postprocedural states: Secondary | ICD-10-CM

## 2016-11-12 DIAGNOSIS — Z029 Encounter for administrative examinations, unspecified: Secondary | ICD-10-CM

## 2016-11-12 DIAGNOSIS — Z90710 Acquired absence of both cervix and uterus: Secondary | ICD-10-CM

## 2016-11-12 DIAGNOSIS — Z9079 Acquired absence of other genital organ(s): Secondary | ICD-10-CM

## 2016-11-12 DIAGNOSIS — Z90722 Acquired absence of ovaries, bilateral: Secondary | ICD-10-CM

## 2016-11-12 MED ORDER — ESTRADIOL 0.1 MG/24HR TD PTTW: 1.0000 | MEDICATED_PATCH | TRANSDERMAL | 12 refills | Status: DC

## 2016-11-12 NOTE — Progress Notes (Signed)
  HPI: Patient returns for routine postoperative follow-up having undergone TAH BSO on 11/06/2016.  The patient's immediate postoperative recovery has been unremarkable. Since hospital discharge the patient reports some diarrhea, hemorrhoids are flaring up.   Current Outpatient Prescriptions: gabapentin (NEURONTIN) 300 MG capsule, Take 300 mg by mouth daily as needed (pain)., Disp: , Rfl:  meloxicam (MOBIC) 7.5 MG tablet, Take 1 tablet (7.5 mg total) by mouth daily., Disp: 30 tablet, Rfl: 0 Menthol-Methyl Salicylate (MUSCLE RUB) 10-15 % CREA, Apply 1 application topically as needed for muscle pain., Disp: , Rfl:  Multiple Vitamin (MULTIVITAMIN) tablet, Take 1 tablet by mouth daily., Disp: , Rfl:  ondansetron (ZOFRAN) 8 MG tablet, Take 1 tablet (8 mg total) by mouth every 6 (six) hours as needed for nausea., Disp: 20 tablet, Rfl: 0 Chlorpheniramine Maleate (ALLERGY PO), Take 1 tablet by mouth daily as needed (allergies)., Disp: , Rfl:  cyclobenzaprine (FLEXERIL) 5 MG tablet, Take 1 tablet (5 mg total) by mouth 3 (three) times daily as needed for muscle spasms., Disp: 30 tablet, Rfl: 1 ketorolac (TORADOL) 10 MG tablet, Take 1 tablet (10 mg total) by mouth every 8 (eight) hours as needed. (Patient not taking: Reported on 11/12/2016), Disp: 15 tablet, Rfl: 0 oxyCODONE-acetaminophen (PERCOCET/ROXICET) 5-325 MG tablet, Take 1-2 tablets by mouth every 4 (four) hours as needed (moderate to severe pain (when tolerating fluids)). (Patient not taking: Reported on 11/12/2016), Disp: 30 tablet, Rfl: 0  No current facility-administered medications for this visit.     Blood pressure 120/60, pulse 72, weight 171 lb (77.6 kg), last menstrual period 10/26/2016.  Physical Exam: Incision clean dry intact Abdomen soft normal post op no brusing Hemorrhoids not thrombosed  Diagnostic Tests:   Pathology: benign  Impression: S/p TAHBSO  Plan:   Follow up: 4  weeks  Florian Buff, MD  Meds ordered this  encounter  Medications  . estradiol (VIVELLE-DOT) 0.1 MG/24HR patch    Sig: Place 1 patch (0.1 mg total) onto the skin 2 (two) times a week.    Dispense:  8 patch    Refill:  12

## 2016-12-10 ENCOUNTER — Encounter: Payer: Self-pay | Admitting: Family Medicine

## 2016-12-10 ENCOUNTER — Ambulatory Visit (INDEPENDENT_AMBULATORY_CARE_PROVIDER_SITE_OTHER): Payer: BLUE CROSS/BLUE SHIELD | Admitting: Obstetrics & Gynecology

## 2016-12-10 ENCOUNTER — Encounter: Payer: Self-pay | Admitting: Obstetrics & Gynecology

## 2016-12-10 ENCOUNTER — Ambulatory Visit (INDEPENDENT_AMBULATORY_CARE_PROVIDER_SITE_OTHER): Payer: BLUE CROSS/BLUE SHIELD | Admitting: Family Medicine

## 2016-12-10 VITALS — BP 112/62 | HR 84 | Temp 97.6°F | Resp 14 | Ht 63.0 in | Wt 165.0 lb

## 2016-12-10 VITALS — BP 110/62 | HR 104 | Wt 164.0 lb

## 2016-12-10 DIAGNOSIS — D649 Anemia, unspecified: Secondary | ICD-10-CM | POA: Diagnosis not present

## 2016-12-10 DIAGNOSIS — Z0000 Encounter for general adult medical examination without abnormal findings: Secondary | ICD-10-CM | POA: Diagnosis not present

## 2016-12-10 DIAGNOSIS — Z9079 Acquired absence of other genital organ(s): Secondary | ICD-10-CM

## 2016-12-10 DIAGNOSIS — E663 Overweight: Secondary | ICD-10-CM | POA: Diagnosis not present

## 2016-12-10 DIAGNOSIS — Z9889 Other specified postprocedural states: Secondary | ICD-10-CM

## 2016-12-10 DIAGNOSIS — Z90710 Acquired absence of both cervix and uterus: Secondary | ICD-10-CM

## 2016-12-10 DIAGNOSIS — Z90722 Acquired absence of ovaries, bilateral: Secondary | ICD-10-CM

## 2016-12-10 DIAGNOSIS — E782 Mixed hyperlipidemia: Secondary | ICD-10-CM | POA: Diagnosis not present

## 2016-12-10 NOTE — Progress Notes (Signed)
   Subjective:    Patient ID: Jacqueline [name], female    DOB: 06/20/1960, 56 y.o.   MRN: [id number]  Patient presents for CPE (is not fasting) Patient here for complete physical exam. She is status post hysterectomy with tubes and ovary removal by GYN, is currently on estrogen patch Mammogram is up-to-date due again in December  Colonoscopy UTD done 2016  Immunizations up-to-date Hepatitis C screen done in the past is negative Last set of fasting lab study year ago LDL 112  Still seeing Dr. Tonita Cong for her back, had epidural no improvement from the shot, has f/u on July 9th   Review Of Systems:  GEN- denies fatigue, fever, weight loss,weakness, recent illness HEENT- denies eye drainage, change in vision, nasal discharge, CVS- denies chest pain, palpitations RESP- denies SOB, cough, wheeze ABD- denies N/V, change in stools, abd pain GU- denies dysuria, hematuria, dribbling, incontinence MSK- denies joint pain, muscle aches, injury Neuro- denies headache, dizziness, syncope, seizure activity       Objective:    BP 112/62   Pulse 84   Temp 97.6 F (36.4 C) (Oral)   Resp 14   Ht 5\' 3"  (1.6 m)   Wt 165 lb (74.8 kg)   LMP 10/26/2016 (Exact Date)   SpO2 99%   BMI 29.23 kg/m  GEN- NAD, alert and oriented x3 HEENT- PERRL, EOMI, non injected sclera, pink conjunctiva, MMM, oropharynx clear, TM clear bilat, no effusion  Neck- Supple, no thyromegaly CVS- RRR, no murmur RESP-CTAB ABD-NABS,soft,NT,ND EXT- No edema Pulses- Radial, DP- 2+        Assessment & Plan:      Problem List Items Addressed This Visit    Hyperlipidemia   Relevant Orders   Lipid panel   Anemia   Relevant Orders   CBC with Differential/Platelet   Iron   Overweight (BMI 25.0-29.9)    Other Visit Diagnoses    Routine general medical examination at a health care facility    -  Primary   CPE done, recheck Hb, iron history of aenmia in setting of recent fibroids, also history hyperlipidemia, though pt  did eat about 5 hours ago, continue to work on weight loss, healthy eating and exercise F/U back doctor for next step for pain. GYN is writing her medical leave orders Mammogram UTD   Relevant Orders   Basic metabolic panel      Note: This dictation was prepared with Dragon dictation along with smaller phrase technology. Any transcriptional errors that result from this process are unintentional.

## 2016-12-10 NOTE — Progress Notes (Signed)
  HPI: Patient returns for routine postoperative follow-up having undergone supracervical abdominal hysterectomy with removal of both tubes and ovaries on 11/06/2016.  The patient's immediate postoperative recovery has been unremarkable. Since hospital discharge the patient reports unremarkable. She is using Vivelle-Dot 0.1 mg changing twice weekly and her hot flushes are under good control   Current Outpatient Prescriptions: estradiol (VIVELLE-DOT) 0.1 MG/24HR patch, Place 1 patch (0.1 mg total) onto the skin 2 (two) times a week., Disp: 8 patch, Rfl: 12 ketorolac (TORADOL) 10 MG tablet, Take 1 tablet (10 mg total) by mouth every 8 (eight) hours as needed., Disp: 15 tablet, Rfl: 0 meloxicam (MOBIC) 7.5 MG tablet, Take 1 tablet (7.5 mg total) by mouth daily., Disp: 30 tablet, Rfl: 0 Menthol-Methyl Salicylate (MUSCLE RUB) 10-15 % CREA, Apply 1 application topically as needed for muscle pain., Disp: , Rfl:  oxyCODONE-acetaminophen (PERCOCET/ROXICET) 5-325 MG tablet, Take 1-2 tablets by mouth every 4 (four) hours as needed (moderate to severe pain (when tolerating fluids))., Disp: 30 tablet, Rfl: 0 Chlorpheniramine Maleate (ALLERGY PO), Take 1 tablet by mouth daily as needed (allergies)., Disp: , Rfl:  cyclobenzaprine (FLEXERIL) 5 MG tablet, Take 1 tablet (5 mg total) by mouth 3 (three) times daily as needed for muscle spasms. (Patient not taking: Reported on 12/10/2016), Disp: 30 tablet, Rfl: 1 gabapentin (NEURONTIN) 300 MG capsule, Take 300 mg by mouth daily as needed (pain)., Disp: , Rfl:  Multiple Vitamin (MULTIVITAMIN) tablet, Take 1 tablet by mouth daily., Disp: , Rfl:  ondansetron (ZOFRAN) 8 MG tablet, Take 1 tablet (8 mg total) by mouth every 6 (six) hours as needed for nausea. (Patient not taking: Reported on 12/10/2016), Disp: 20 tablet, Rfl: 0  No current facility-administered medications for this visit.     Blood pressure 110/62, pulse (!) 104, weight 164 lb (74.4 kg), last menstrual  period 10/26/2016.  Physical Exam: Incisions clean dry and intact Abdominal exam is benign Patient states she's get little pooch but that her little pooch She has a mild yeast infection she's been take some over-the-counter Monistat for that Her cervix is without lesions and bimanual exam is benign  Diagnostic Tests:   Pathology: Benign  Impression: Status post abdominal supracervical hysterectomy with BSO for 20 week size fibroids  Plan: The patient can go ahead and start having intercourse She is off all restrictions and I'll see her back in one year She will continue use of Vivelle-Dot patches twice a week  Follow up: 1  years  Florian Buff, MD

## 2016-12-10 NOTE — Patient Instructions (Signed)
F/U 1 year for Physical  

## 2016-12-11 LAB — CBC WITH DIFFERENTIAL/PLATELET
BASOS ABS: 49 {cells}/uL (ref 0–200)
Basophils Relative: 1 %
EOS ABS: 294 {cells}/uL (ref 15–500)
EOS PCT: 6 %
HCT: 34 % — ABNORMAL LOW (ref 35.0–45.0)
HEMOGLOBIN: 11.1 g/dL — AB (ref 12.0–15.0)
Lymphocytes Relative: 42 %
Lymphs Abs: 2058 cells/uL (ref 850–3900)
MCH: 31.1 pg (ref 27.0–33.0)
MCHC: 32.6 g/dL (ref 32.0–36.0)
MCV: 95.2 fL (ref 80.0–100.0)
MONOS PCT: 10 %
MPV: 11.6 fL (ref 7.5–12.5)
Monocytes Absolute: 490 cells/uL (ref 200–950)
NEUTROS PCT: 41 %
Neutro Abs: 2009 cells/uL (ref 1500–7800)
PLATELETS: 216 10*3/uL (ref 140–400)
RBC: 3.57 MIL/uL — ABNORMAL LOW (ref 3.80–5.10)
RDW: 13.7 % (ref 11.0–15.0)
WBC: 4.9 10*3/uL (ref 3.8–10.8)

## 2016-12-11 LAB — BASIC METABOLIC PANEL
BUN: 11 mg/dL (ref 7–25)
CALCIUM: 9.1 mg/dL (ref 8.6–10.4)
CO2: 25 mmol/L (ref 20–31)
CREATININE: 0.94 mg/dL (ref 0.50–1.05)
Chloride: 107 mmol/L (ref 98–110)
GLUCOSE: 92 mg/dL (ref 70–99)
Potassium: 3.6 mmol/L (ref 3.5–5.3)
SODIUM: 141 mmol/L (ref 135–146)

## 2016-12-11 LAB — LIPID PANEL
CHOL/HDL RATIO: 4 ratio (ref <5.0)
CHOLESTEROL: 179 mg/dL (ref <200)
HDL: 45 mg/dL — ABNORMAL LOW (ref >50)
LDL Cholesterol: 115 mg/dL — ABNORMAL HIGH (ref <100)
Triglycerides: 96 mg/dL (ref <150)
VLDL: 19 mg/dL (ref <30)

## 2016-12-11 LAB — IRON: IRON: 44 ug/dL — AB (ref 45–160)

## 2016-12-12 ENCOUNTER — Encounter: Payer: Self-pay | Admitting: Family Medicine

## 2016-12-16 ENCOUNTER — Encounter: Payer: Self-pay | Admitting: *Deleted

## 2016-12-18 ENCOUNTER — Telehealth: Payer: Self-pay | Admitting: Obstetrics & Gynecology

## 2016-12-19 NOTE — Telephone Encounter (Signed)
LMOVM returning call informing her that 6 weeks from surgery was 7/13. If she wanted to wait to start on Monday, to let me know and I would discuss Dr Elonda Husky to get a note for her. Advised to call back if she had further questions or needed anything else.

## 2017-01-09 DIAGNOSIS — H6982 Other specified disorders of Eustachian tube, left ear: Secondary | ICD-10-CM | POA: Diagnosis not present

## 2017-01-09 DIAGNOSIS — J309 Allergic rhinitis, unspecified: Secondary | ICD-10-CM | POA: Diagnosis not present

## 2017-01-20 DIAGNOSIS — N898 Other specified noninflammatory disorders of vagina: Secondary | ICD-10-CM | POA: Diagnosis not present

## 2017-01-20 DIAGNOSIS — R350 Frequency of micturition: Secondary | ICD-10-CM | POA: Diagnosis not present

## 2017-01-20 DIAGNOSIS — N760 Acute vaginitis: Secondary | ICD-10-CM | POA: Diagnosis not present

## 2017-02-11 ENCOUNTER — Encounter: Payer: Self-pay | Admitting: Family Medicine

## 2017-04-25 ENCOUNTER — Other Ambulatory Visit: Payer: Self-pay | Admitting: Family Medicine

## 2017-04-25 DIAGNOSIS — Z1231 Encounter for screening mammogram for malignant neoplasm of breast: Secondary | ICD-10-CM

## 2017-05-26 ENCOUNTER — Ambulatory Visit (HOSPITAL_COMMUNITY)
Admission: RE | Admit: 2017-05-26 | Discharge: 2017-05-26 | Disposition: A | Discharge status: Home / Self Care | Payer: BLUE CROSS/BLUE SHIELD | Source: Ambulatory Visit | Attending: Family Medicine | Admitting: Family Medicine

## 2017-05-26 ENCOUNTER — Encounter (HOSPITAL_COMMUNITY): Payer: Self-pay

## 2017-05-26 DIAGNOSIS — Z1231 Encounter for screening mammogram for malignant neoplasm of breast: Secondary | ICD-10-CM | POA: Diagnosis not present

## 2017-05-28 ENCOUNTER — Ambulatory Visit (HOSPITAL_COMMUNITY): Payer: BLUE CROSS/BLUE SHIELD

## 2017-11-05 ENCOUNTER — Ambulatory Visit: Payer: BLUE CROSS/BLUE SHIELD | Admitting: Family Medicine

## 2017-11-05 ENCOUNTER — Other Ambulatory Visit: Payer: Self-pay

## 2017-11-05 VITALS — BP 128/82 | HR 74 | Temp 97.9°F | Resp 15 | Wt 184.0 lb

## 2017-11-05 DIAGNOSIS — N952 Postmenopausal atrophic vaginitis: Secondary | ICD-10-CM | POA: Diagnosis not present

## 2017-11-05 DIAGNOSIS — N898 Other specified noninflammatory disorders of vagina: Secondary | ICD-10-CM

## 2017-11-05 LAB — URINALYSIS, ROUTINE W REFLEX MICROSCOPIC
Bilirubin Urine: NEGATIVE
Glucose, UA: NEGATIVE
HGB URINE DIPSTICK: NEGATIVE
Ketones, ur: NEGATIVE
LEUKOCYTES UA: NEGATIVE
NITRITE: NEGATIVE
PH: 5.5 (ref 5.0–8.0)
PROTEIN: NEGATIVE
Specific Gravity, Urine: 1.015 (ref 1.001–1.03)

## 2017-11-05 LAB — WET PREP FOR TRICH, YEAST, CLUE

## 2017-11-05 MED ORDER — METRONIDAZOLE 500 MG PO TABS: 500.0000 mg | ORAL_TABLET | Freq: Two times a day (BID) | ORAL | 0 refills | Status: AC

## 2017-11-05 MED ORDER — ESTROGENS, CONJUGATED 0.625 MG/GM VA CREA: 1.0000 | TOPICAL_CREAM | Freq: Every day | VAGINAL | 1 refills | Status: DC

## 2017-11-05 NOTE — Progress Notes (Signed)
Patient ID: Jacqueline [name], female    DOB: 08-Jul-1960, 57 y.o.   MRN: [id number]  PCP: Alycia Rossetti, MD  Chief Complaint  Patient presents with  . Urinary Tract Infection    Patient has concerns of  light beige/ pinkish  vaginal discharge with some     Subjective:   Jacqueline [name] is a 57 y.o. female, presents to clinic with CC of vaginal discharge and irritation for 2 weeks.  Vaginal opening feels "different" but no pain no dyspareunia.  She has had intermittent very thin runny clear to white to light beige vaginal discharge last week and this week and one episode of pink-tinged vaginal discharge when she wiped after urinating.  1 week ago she treated herself for possible yeast infection with Monistat 1.  At the time she thought her symptoms might be a yeast infection but she denies any swelling itching burning genital redness rash or thicker white discharge.  She is sexually active with one man, has been with him for the past 7 years but he does travel she notes that he has cheated on her in the past.   She denies any abdominal pain, flank pain, nausea, vomiting, fever, change in appetite, dysuria, hematuria, urinary frequency or urgency.  She is one year s/p hysterectomy supracervical abdominal and BILATERAL SALPINGO OOPHORECTOMY.  Is due in July of this year to see her OB/GYN for follow-up and Pap smear.  She denies any other pertinent OB/GYN history.  Is G3P3.  Is on estrodiol patch. S/P cholecystectomy.   Patient Active Problem List   Diagnosis Date Noted  . S/P abdominal supracervical subtotal hysterectomy 11/06/2016  . Colon cancer screening   . Gastroesophageal reflux disease with esophagitis   . Anemia 08/04/2014  . GERD (gastroesophageal reflux disease) 08/04/2014  . Shoulder strain 07/26/2014  . Degenerative arthritis of thumb 07/26/2014  . Hoarse voice quality 02/28/2014  . Left knee pain 02/28/2014  . Hip pain 04/15/2013  . Epicondylitis, lateral (tennis elbow)  04/13/2013  . Constipation 09/15/2012  . Hyperlipidemia 11/28/2011  . Overweight (BMI 25.0-29.9) 09/15/2011  . ALLERGIC RHINITIS, SEASONAL 11/13/2007     Prior to Admission medications   Medication Sig Start Date End Date Taking? Authorizing Provider  estradiol (VIVELLE-DOT) 0.1 MG/24HR patch Place 1 patch (0.1 mg total) onto the skin 2 (two) times a week. 11/14/16  Yes Florian Buff, MD  gabapentin (NEURONTIN) 300 MG capsule Take 300 mg by mouth 3 (three) times daily.   Yes [provider]  meloxicam (MOBIC) 7.5 MG tablet Take 1 tablet (7.5 mg total) by mouth daily. Patient not taking: Reported on 11/05/2017 10/18/16   Alycia Rossetti, MD  Menthol-Methyl Salicylate (MUSCLE RUB) 10-15 % CREA Apply 1 application topically as needed for muscle pain.    [provider]     No Known Allergies   Family History  Problem Relation Age of Onset  . Cancer Maternal Grandmother         lymphnodes  . Hypertension Maternal Aunt   . Cancer Maternal Aunt        Breast  . Colon cancer Neg Hx      Social History   Socioeconomic History  . Marital status: Single    Spouse name: Not on file  . Number of children: 3  . Years of education: Not on file  . Highest education level: Not on file  Occupational History  . Not on file  Social Needs  .  Financial resource strain: Not on file  . Food insecurity:    Worry: Not on file    Inability: Not on file  . Transportation needs:    Medical: Not on file    Non-medical: Not on file  Tobacco Use  . Smoking status: Never Smoker  . Smokeless tobacco: Never Used  . Tobacco comment: Never smoked  Substance and Sexual Activity  . Alcohol use: No    Alcohol/week: 0.0 oz  . Drug use: No  . Sexual activity: Not Currently    Birth control/protection: None  Lifestyle  . Physical activity:    Days per week: Not on file    Minutes per session: Not on file  . Stress: Not on file  Relationships  . Social connections:    Talks  on phone: Not on file    Gets together: Not on file    Attends religious service: Not on file    Active member of club or organization: Not on file    Attends meetings of clubs or organizations: Not on file    Relationship status: Not on file  . Intimate partner violence:    Fear of current or ex partner: Not on file    Emotionally abused: Not on file    Physically abused: Not on file    Forced sexual activity: Not on file  Other Topics Concern  . Not on file  Social History Narrative  . Not on file     Review of Systems  Constitutional: Negative.  Negative for activity change, appetite change, chills, diaphoresis, fatigue and fever.  HENT: Negative.   Respiratory: Negative.   Cardiovascular: Negative.   Gastrointestinal: Negative.   Genitourinary: Negative for decreased urine volume, difficulty urinating, dyspareunia, enuresis, flank pain, frequency, hematuria and urgency.  Musculoskeletal: Negative.  Negative for back pain and myalgias.  Skin: Negative.  Negative for rash and wound.  Allergic/Immunologic: Negative.  Negative for immunocompromised state.  Neurological: Negative.   Hematological: Negative.   All other systems reviewed and are negative.      Objective:    Vitals:   11/05/17 1611  BP: 128/82  Pulse: 74  Resp: 15  Temp: 97.9 F (36.6 C)  TempSrc: Oral  SpO2: 94%  Weight: 184 lb (83.5 kg)      Physical Exam  Constitutional: She appears well-developed and well-nourished. No distress.  HENT:  Head: Normocephalic and atraumatic.  Nose: Nose normal.  Mouth/Throat: Oropharynx is clear and moist.  Eyes: Pupils are equal, round, and reactive to light. Conjunctivae are normal. Right eye exhibits no discharge. Left eye exhibits no discharge. No scleral icterus.  Neck: Normal range of motion. No tracheal deviation present.  Cardiovascular: Normal rate, regular rhythm, normal heart sounds and intact distal pulses. Exam reveals no gallop and no friction rub.   No murmur heard. Pulmonary/Chest: Effort normal. No stridor. No respiratory distress.  Abdominal: Soft. Bowel sounds are normal. She exhibits no distension and no mass. There is no tenderness. There is no rebound and no guarding. Hernia confirmed negative in the right inguinal area and confirmed negative in the left inguinal area.  No CVA tenderness b/l  Genitourinary: No labial fusion. There is no rash or tenderness on the right labia. There is no rash or tenderness on the left labia. Cervix exhibits no motion tenderness. Right adnexum displays no mass, no tenderness and no fullness. Left adnexum displays no mass and no tenderness. No erythema, tenderness or bleeding in the vagina. No foreign body in  the vagina. No signs of injury around the vagina. No vaginal discharge found.  Genitourinary Comments: Atrophied labia minora and vulva, no discharge, lesions, rash or swelling, nontender No CMT No adnexal tenderness bilaterally, no fullness Roughly 1-1.5 cm round nontender nodule palpated to left lateral area of vaginal fornix Pt refused pelvic exam with speculum Vaginal swabs obtained with bimanual exam No masses palpated with bimanual exam No suprapubic tenderness  Musculoskeletal: Normal range of motion.  Lymphadenopathy: No inguinal adenopathy noted on the right or left side.  Neurological: She is alert. She exhibits normal muscle tone. Coordination normal.  Skin: Skin is warm and dry. No rash noted. She is not diaphoretic.  Psychiatric: She has a normal mood and affect. Her behavior is normal.  Nursing note and vitals reviewed.   Results for orders placed or performed in visit on 11/05/17  C. trachomatis/N. gonorrhoeae RNA  Result Value Ref Range   C. trachomatis RNA, TMA NOT DETECTED NOT DETECT   N. gonorrhoeae RNA, TMA NOT DETECTED NOT DETECT  WET PREP FOR TRICH, YEAST, CLUE  Result Value Ref Range   Source: VAGINA    RESULT    Urinalysis, Routine w reflex microscopic  Result  Value Ref Range   Color, Urine YELLOW YELLOW   APPearance CLEAR CLEAR   Specific Gravity, Urine 1.015 1.001 - 1.03   pH 5.5 5.0 - 8.0   Glucose, UA NEGATIVE NEGATIVE   Bilirubin Urine NEGATIVE NEGATIVE   Ketones, ur NEGATIVE NEGATIVE   Hgb urine dipstick NEGATIVE NEGATIVE   Protein, ur NEGATIVE NEGATIVE   Nitrite NEGATIVE NEGATIVE   Leukocytes, UA NEGATIVE NEGATIVE    Wet prep - normal epithelials and bacteria, no clue cells, no yeast, no Trichomonas visualized, see results section    Assessment & Plan:      ICD-10-CM   1. Vaginal discharge N89.8 Urinalysis, Routine w reflex microscopic    C. trachomatis/N. gonorrhoeae RNA    WET PREP FOR TRICH, YEAST, CLUE    US Pelvis Complete    US Transvaginal Non-OB    metroNIDAZOLE (FLAGYL) 500 MG tablet  2. 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  No Urinary symptoms, no dyspareunia, no abd or constitutional sx.  Non-toxic appearing female with VSS, no abdominal or flank tenderness on exam.  Wet prep and STD swabs were obtained with bimanual exam, patient refused normal pelvic exam with speculum, and therefore vaginal walls and cervix were not directly visualized but only palpated.  There is a nontender lump or nodule left lateral aspect of the vaginal fornix, no CMT, no other fullness, masses or tenderness.  UA was normal, and wet prep was negative for BV, trichomoniasis and yeast.  Exam is significant for likely atrophic vaginitis, will start Premarin.  Order pelvic and transvaginal ultrasound to evaluate, and urged patient to return for pelvic exam here for direct visualization or go to her OB/GYN for full evaluation.  None concerning for  PID, will wait for STD test.  Nodule may be an irregular polyp?  Will cover for possible BV.  She has already treated herself for possible yeast vaginitis.  Gonorrhea and Chlamydia labs pending.  Discussed plan with patient verbalized understanding.    Delsa Grana, PA-C 11/05/17 5:20 PM

## 2017-11-05 NOTE — Patient Instructions (Addendum)
Waiting for STD tests and checking for yeast and BV.    Ultrasound ordered to evaluated pelvis and ovaries.    I strongly suggest seeing your OBGYN ASAP to get full pelvic exam done, since you did not want to do that today.  Your urine was normal.  Your vaginal opening looked a little atrophied, which is common after menopause vaginal tissues lose elasticity and moisture, be treated with a topical cream which was sent to pharmacy for you.   Vaginitis Vaginitis is an inflammation of the vagina. It can happen when the normal bacteria and yeast in the vagina grow too much. There are different types. Treatment will depend on the type you have. Follow these instructions at home:  Take all medicines as told by your doctor.  Keep your vagina area clean and dry. Avoid soap. Rinse the area with water.  Avoid washing and cleaning out the vagina (douching).  Do not use tampons or have sex (intercourse) until your treatment is done.  Wipe from front to back after going to the restroom.  Wear cotton underwear.  Avoid wearing underwear while you sleep until your vaginitis is gone.  Avoid tight pants. Avoid underwear or nylons without a cotton panel.  Take off wet clothing (such as a bathing suit) as soon as you can.  Use mild, unscented products. Avoid fabric softeners and scented: ? Feminine sprays. ? Laundry detergents. ? Tampons. ? Soaps or bubble baths.  Practice safe sex and use condoms. Get help right away if:  You have belly (abdominal) pain.  You have a fever or lasting symptoms for more than 2-3 days.  You have a fever and your symptoms suddenly get worse. This information is not intended to replace advice given to you by your health care provider. Make sure you discuss any questions you have with your health care provider. Document Released: 08/23/2008 Document Revised: 11/02/2015 Document Reviewed: 11/07/2011 Elsevier Interactive Patient Education  2017 Elsevier  Inc.    Atrophic Vaginitis Atrophic vaginitis is when the tissues that line the vagina become dry and thin. This is caused by a drop in estrogen. Estrogen helps:  To keep the vagina moist.  To make a clear fluid that helps: ? To lubricate the vagina for sex. ? To protect the vagina from infection.  If the lining of the vagina is dry and thin, it may:  Make sex painful. It may also cause bleeding.  Cause a feeling of: ? Burning. ? Irritation. ? Itchiness.  Make an exam of your vagina painful. It may also cause bleeding.  Make you lose interest in sex.  Cause a burning feeling when you pee.  Make your vaginal fluid (discharge) brown or yellow.  For some women, there are no symptoms. This condition is most common in women who do not get their regular menstrual periods anymore (menopause). This often starts when a woman is 65-62 years old. Follow these instructions at home:  Take medicines only as told by your doctor. Do not use any herbal or alternative medicines unless your doctor says it is okay.  Use over-the-counter products for dryness only as told by your doctor. These include: ? Creams. ? Lubricants. ? Moisturizers.  Do not douche.  Do not use products that can make your vagina dry. These include: ? Scented feminine sprays. ? Scented tampons. ? Scented soaps.  If it hurts to have sex, tell your sexual partner. Contact a doctor if:  Your discharge looks different than normal.  Your vagina  has an unusual smell.  You have new symptoms.  Your symptoms do not get better with treatment.  Your symptoms get worse. This information is not intended to replace advice given to you by your health care provider. Make sure you discuss any questions you have with your health care provider. Document Released: 11/13/2007 Document Revised: 11/02/2015 Document Reviewed: 05/18/2014 Elsevier Interactive Patient Education  [employer].

## 2017-11-06 ENCOUNTER — Telehealth: Payer: Self-pay | Admitting: *Deleted

## 2017-11-06 ENCOUNTER — Telehealth: Payer: Self-pay | Admitting: Obstetrics & Gynecology

## 2017-11-06 LAB — C. TRACHOMATIS/N. GONORRHOEAE RNA
C. TRACHOMATIS RNA, TMA: NOT DETECTED
N. GONORRHOEAE RNA, TMA: NOT DETECTED

## 2017-11-06 MED ORDER — ESTRADIOL 0.1 MG/GM VA CREA: 1.0000 | TOPICAL_CREAM | Freq: Every day | VAGINAL | 1 refills | Status: DC

## 2017-11-06 NOTE — Telephone Encounter (Signed)
Received call from patient.   Requested alternative to Premarin. States that cream is noted at >$80 co-pay.   Please advise.

## 2017-11-06 NOTE — Telephone Encounter (Signed)
I sent a different version of same med to pharmacy, hope it will be more affordable.

## 2017-11-06 NOTE — Progress Notes (Signed)
STD tests are negative.  The wet prep was negative, but I did send medicine to the pharmacy to treat BV, since her sx were most suspicious for this, she has had it in the past, and she has already treated possible yeast infection.   Larene Beach, can she go to [facility] for the ultrasound?  Can we help arrange that for her.  Thank you

## 2017-11-11 ENCOUNTER — Ambulatory Visit (HOSPITAL_COMMUNITY)
Admission: RE | Admit: 2017-11-11 | Discharge: 2017-11-11 | Disposition: A | Discharge status: Home / Self Care | Payer: BLUE CROSS/BLUE SHIELD | Source: Ambulatory Visit | Attending: Family Medicine | Admitting: Family Medicine

## 2017-11-11 DIAGNOSIS — N899 Noninflammatory disorder of vagina, unspecified: Secondary | ICD-10-CM | POA: Diagnosis not present

## 2017-11-11 DIAGNOSIS — N898 Other specified noninflammatory disorders of vagina: Secondary | ICD-10-CM | POA: Insufficient documentation

## 2017-11-12 ENCOUNTER — Ambulatory Visit (HOSPITAL_COMMUNITY): Payer: BLUE CROSS/BLUE SHIELD

## 2017-11-12 NOTE — Progress Notes (Signed)
Nodule that I felt on exam was visible on the ultrasound.  The [place] is not sensitive enough to tell you exactly what the lesion is from, it was 2.6 x 1.6 x 2.4 cm, the radiology report states that it may be possible residual fibroid tissue or a lesion coming from the cervix or even some postsurgical scarring or fibrosis.  You need to follow up with your OBGYN/surgeon that did hysterectomy.  I will attempt to route the chart and these ultrasound findings to your OB/GYN, please follow-up with them advised about appropriate follow-up for an office visit.

## 2017-11-20 ENCOUNTER — Ambulatory Visit (INDEPENDENT_AMBULATORY_CARE_PROVIDER_SITE_OTHER): Payer: BLUE CROSS/BLUE SHIELD | Admitting: Obstetrics & Gynecology

## 2017-11-20 ENCOUNTER — Encounter: Payer: Self-pay | Admitting: Obstetrics & Gynecology

## 2017-11-20 ENCOUNTER — Other Ambulatory Visit: Payer: Self-pay

## 2017-11-20 VITALS — BP 118/70 | HR 67 | Ht 62.5 in | Wt 184.0 lb

## 2017-11-20 DIAGNOSIS — Z90711 Acquired absence of uterus with remaining cervical stump: Secondary | ICD-10-CM | POA: Diagnosis not present

## 2017-11-20 MED ORDER — ESTRADIOL 0.1 MG/24HR TD PTTW: 1.0000 | MEDICATED_PATCH | TRANSDERMAL | 12 refills | Status: DC

## 2017-11-20 MED ORDER — ESTRADIOL 0.1 MG/GM VA CREA: 2.0000 g | TOPICAL_CREAM | VAGINAL | 12 refills | Status: DC

## 2017-11-20 NOTE — Progress Notes (Signed)
Chief Complaint  Patient presents with  . Follow-up    u/s      57 y.o. G3P3 Patient's last menstrual period was 10/26/2016 (exact date). The current method of family planning is status post hysterectomy.  Outpatient Encounter Medications as of 11/20/2017  Medication Sig  . meloxicam (MOBIC) 7.5 MG tablet Take 1 tablet (7.5 mg total) by mouth daily.  Marland Kitchen estradiol (ESTRACE) 0.1 MG/GM vaginal cream Place 2 g vaginally as directed. Use 2 grams every other night  . estradiol (VIVELLE-DOT) 0.1 MG/24HR patch Place 1 patch (0.1 mg total) onto the skin 2 (two) times a week.  . gabapentin (NEURONTIN) 300 MG capsule Take 300 mg by mouth 3 (three) times daily.  . [DISCONTINUED] conjugated estrogens (PREMARIN) vaginal cream Place 1 Applicatorful vaginally daily.  . [DISCONTINUED] estradiol (ESTRACE) 0.1 MG/GM vaginal cream Place 1 Applicatorful vaginally at bedtime.  . [DISCONTINUED] estradiol (VIVELLE-DOT) 0.1 MG/24HR patch Place 1 patch (0.1 mg total) onto the skin 2 (two) times a week. (Patient not taking: Reported on 11/20/2017)  . [DISCONTINUED] Menthol-Methyl Salicylate (MUSCLE RUB) 10-15 % CREA Apply 1 application topically as needed for muscle pain.   No facility-administered encounter medications on file as of 11/20/2017.     Subjective Jacqueline [name] was sent to me after having a pelvic sonogram which revealed a mass at the top of the vagina that was seen and felt on exam when she had a yeast infection They were unaware she had a supracervical hysterectomy and the patient evidently did not tell them Sonogram was viewed by me and and it is indeed her cervix Pt is without complaints otherwise Her op note was reviewed and pt was aware and actually wanted the supracervical approach  Past Medical History:  Diagnosis Date  . Anemia   . Fibroid uterus   . GERD (gastroesophageal reflux disease)   . Hyperlipidemia    Borderline  . Pre-diabetes    borderline    Past Surgical  History:  Procedure Laterality Date  . CHOLECYSTECTOMY    . COLONOSCOPY  2003   Dr. Gala Romney: Anal canal hemorrhoids  . COLONOSCOPY N/A 08/25/2014   Procedure: COLONOSCOPY;  Surgeon: Daneil Dolin, MD;  Location: [facility];  Service: Endoscopy;  Laterality: N/A;  1200  . ESOPHAGOGASTRODUODENOSCOPY N/A 08/25/2014   Procedure: ESOPHAGOGASTRODUODENOSCOPY (EGD);  Surgeon: Daneil Dolin, MD;  Location: [facility];  Service: Endoscopy;  Laterality: N/A;  . SALPINGOOPHORECTOMY Bilateral 11/06/2016   Procedure: BILATERAL SALPINGO OOPHORECTOMY;  Surgeon: Florian Buff, MD;  Location: [facility];  Service: Gynecology;  Laterality: Bilateral;  . SUPRACERVICAL ABDOMINAL HYSTERECTOMY N/A 11/06/2016   Procedure: HYSTERECTOMY SUPRACERVICAL ABDOMINAL;  Surgeon: Florian Buff, MD;  Location: [facility];  Service: Gynecology;  Laterality: N/A;  . TUBAL LIGATION      OB History    Gravida  3   Para  3   Term      Preterm      AB      Living  3     SAB      TAB      Ectopic      Multiple      Live Births              No Known Allergies  Social History   Socioeconomic History  . Marital status: Single    Spouse name: Not on file  . Number of children: 3  . Years of education: Not on  file  . Highest education level: Not on file  Occupational History  . Not on file  Social Needs  . Financial resource strain: Not on file  . Food insecurity:    Worry: Not on file    Inability: Not on file  . Transportation needs:    Medical: Not on file    Non-medical: Not on file  Tobacco Use  . Smoking status: Never Smoker  . Smokeless tobacco: Never Used  . Tobacco comment: Never smoked  Substance and Sexual Activity  . Alcohol use: No    Alcohol/week: 0.0 oz  . Drug use: No  . Sexual activity: Yes    Birth control/protection: None, Post-menopausal  Lifestyle  . Physical activity:    Days per week: Not on file    Minutes per session: Not on file  . Stress: Not on file    Relationships  . Social connections:    Talks on phone: Not on file    Gets together: Not on file    Attends religious service: Not on file    Active member of club or organization: Not on file    Attends meetings of clubs or organizations: Not on file    Relationship status: Not on file  Other Topics Concern  . Not on file  Social History Narrative  . Not on file    Family History  Problem Relation Age of Onset  . Cancer Maternal Grandmother         lymphnodes  . Hypertension Maternal Aunt   . Cancer Maternal Aunt        Breast  . Colon cancer Neg Hx     Medications:       Current Outpatient Medications:  .  meloxicam (MOBIC) 7.5 MG tablet, Take 1 tablet (7.5 mg total) by mouth daily., Disp: 30 tablet, Rfl: 0 .  estradiol (ESTRACE) 0.1 MG/GM vaginal cream, Place 2 g vaginally as directed. Use 2 grams every other night, Disp: 30 g, Rfl: 12 .  estradiol (VIVELLE-DOT) 0.1 MG/24HR patch, Place 1 patch (0.1 mg total) onto the skin 2 (two) times a week., Disp: 8 patch, Rfl: 12 .  gabapentin (NEURONTIN) 300 MG capsule, Take 300 mg by mouth 3 (three) times daily., Disp: , Rfl:   Objective Blood pressure 118/70, pulse 67, height 5' 2.5" (1.588 m), weight 184 lb (83.5 kg), last menstrual period 10/26/2016.  Gen WDWN NAD  Pertinent ROS No burning with urination, frequency or urgency No nausea, vomiting or diarrhea Nor fever chills or other constitutional symptoms   Labs or studies Reviewed sonogram images    Impression Diagnoses this Encounter::   ICD-10-CM   1. History of hysterectomy, supracervical Z90.711     Established relevant diagnosis(es):   Plan/Recommendations: Meds ordered this encounter  Medications  . estradiol (VIVELLE-DOT) 0.1 MG/24HR patch    Sig: Place 1 patch (0.1 mg total) onto the skin 2 (two) times a week.    Dispense:  8 patch    Refill:  12  . estradiol (ESTRACE) 0.1 MG/GM vaginal cream    Sig: Place 2 g vaginally as directed. Use 2 grams  every other night    Dispense:  30 g    Refill:  12    Labs or Scans Ordered: No orders of the defined types were placed in this encounter.   Management:: Pt informed of findings and she is reassured I also refilled her vivelle dot patch  Follow up Return in about 1 year (around 11/21/2018)  for Follow up, with Dr Elonda Husky.        Face to face time:  15 minutes  Greater than 50% of the visit time was spent in counseling and coordination of care with the patient.  The summary and outline of the counseling and care coordination is summarized in the note above.   All questions were answered.

## 2017-12-09 DIAGNOSIS — Z113 Encounter for screening for infections with a predominantly sexual mode of transmission: Secondary | ICD-10-CM | POA: Diagnosis not present

## 2017-12-10 ENCOUNTER — Telehealth: Payer: Self-pay | Admitting: Family Medicine

## 2017-12-10 ENCOUNTER — Ambulatory Visit (INDEPENDENT_AMBULATORY_CARE_PROVIDER_SITE_OTHER): Payer: BLUE CROSS/BLUE SHIELD | Admitting: Family Medicine

## 2017-12-10 ENCOUNTER — Encounter: Payer: Self-pay | Admitting: Family Medicine

## 2017-12-10 VITALS — BP 110/78 | HR 60 | Temp 97.5°F | Resp 14 | Ht 63.0 in | Wt 182.0 lb

## 2017-12-10 DIAGNOSIS — Z0000 Encounter for general adult medical examination without abnormal findings: Secondary | ICD-10-CM | POA: Diagnosis not present

## 2017-12-10 DIAGNOSIS — Z114 Encounter for screening for human immunodeficiency virus [HIV]: Secondary | ICD-10-CM

## 2017-12-10 DIAGNOSIS — Z6832 Body mass index (BMI) 32.0-32.9, adult: Secondary | ICD-10-CM

## 2017-12-10 DIAGNOSIS — W19XXXS Unspecified fall, sequela: Secondary | ICD-10-CM

## 2017-12-10 DIAGNOSIS — Z13820 Encounter for screening for osteoporosis: Secondary | ICD-10-CM

## 2017-12-10 DIAGNOSIS — S39012D Strain of muscle, fascia and tendon of lower back, subsequent encounter: Secondary | ICD-10-CM

## 2017-12-10 DIAGNOSIS — Z90711 Acquired absence of uterus with remaining cervical stump: Secondary | ICD-10-CM

## 2017-12-10 DIAGNOSIS — E669 Obesity, unspecified: Secondary | ICD-10-CM | POA: Diagnosis not present

## 2017-12-10 DIAGNOSIS — Z90722 Acquired absence of ovaries, bilateral: Secondary | ICD-10-CM

## 2017-12-10 DIAGNOSIS — N949 Unspecified condition associated with female genital organs and menstrual cycle: Secondary | ICD-10-CM

## 2017-12-10 LAB — URINALYSIS, ROUTINE W REFLEX MICROSCOPIC
BACTERIA UA: NONE SEEN /HPF
Bilirubin Urine: NEGATIVE
GLUCOSE, UA: NEGATIVE
KETONES UR: NEGATIVE
LEUKOCYTES UA: NEGATIVE
NITRITE: NEGATIVE
PH: 5.5 (ref 5.0–8.0)
Protein, ur: NEGATIVE
SPECIFIC GRAVITY, URINE: 1.025 (ref 1.001–1.03)
WBC UA: NONE SEEN /HPF (ref 0–5)

## 2017-12-10 LAB — MICROSCOPIC MESSAGE

## 2017-12-10 LAB — TSH: TSH: 0.63 mIU/L

## 2017-12-10 MED ORDER — MICONAZOLE NITRATE 2 % VA CREA: 1.0000 | TOPICAL_CREAM | Freq: Every day | VAGINAL | 0 refills | Status: DC

## 2017-12-10 MED ORDER — CYCLOBENZAPRINE HCL 10 MG PO TABS: 10.0000 mg | ORAL_TABLET | Freq: Three times a day (TID) | ORAL | 0 refills | Status: DC | PRN

## 2017-12-10 NOTE — Progress Notes (Signed)
Patient: Jacqueline [name], Female    DOB: 1960-06-24, 57 y.o.   MRN: [id number] Visit Date: 12/10/2017  Today's Provider: Delsa Grana, PA-C   Chief Complaint  Patient presents with  . Annual Exam    with pap   Subjective:    Annual physical exam Jacqueline [name] is a 57 y.o. female who presents today for health maintenance and complete physical. She feels fairly well. She reports exercising not able to be active due to injury earlier this year with now chronic back pain. She reports she is sleeping poorly also due to pain. Not sleeping due to back pain, average 4-5 hours a night. She has gained weight due to these issues, but otherwise feels well. -----------------------------------------------------------------  Status post fall with lumbar strain, states that she has degenerative disc disease and she is having trouble getting around is seeing Ortho, Dr. Tonita Cong for this.  Pain has been keeping up at night she is feeling tired and feels this is contributing to gaining weight.  Sitting for long periods of time increases her pain and nighttime she does have shooting intermittent severe pain when she is moving in bed.  She is status post abdominal supracervical hysterectomy and bilateral salpingo-oophorectomy, she is treated with estrogen patches and Premarin cream.  She has had some intermittent vaginal and urinary issues, currently improved.  A vaginal and paracervical lesion was found on bimanual exam me on Nov 05, 2017 when she was seen in the same office for vaginal discharge and irritation, ultrasound was done to evaluate the lesion, she was reported to be a 2.6 cm heterogeneous hypoechoic lesion positioned at the right vaginal cuff which was indeterminant with the differential being residual myometrial tissue, possible fibroid, possible exophytic lesion arising from cervix or postsurgical scarring or fibrosis with MRI of pelvis recommended as further evaluation.  She did follow-up with her  OB/GYN who stated that it was her cervix.  She did have resolution of her vaginal symptoms.  Mammogram is up-to-date it is due in December Colonoscopy is up-to-date done 2016 and managed by GI  Past medical problems were anemia, mixed hyperlipidemia and overweight with BMI 25-29.9.  Current BMI is 32.2.  Review of Systems  Constitutional: Negative.   HENT: Negative.   Eyes: Negative.   Respiratory: Negative.   Cardiovascular: Negative.   Gastrointestinal: Negative.   Endocrine: Negative.   Genitourinary: Negative.   Musculoskeletal: Positive for back pain.  Skin: Negative.   Allergic/Immunologic: Negative.   Hematological: Negative.   Psychiatric/Behavioral: Negative.   All other systems reviewed and are negative.   Social History      She  reports that she has never smoked. She has never used smokeless tobacco. She reports that she does not drink alcohol or use drugs.       Social History   Socioeconomic History  . Marital status: Single    Spouse name: Not on file  . Number of children: 3  . Years of education: Not on file  . Highest education level: Not on file  Occupational History  . Not on file  Social Needs  . Financial resource strain: Not on file  . Food insecurity:    Worry: Not on file    Inability: Not on file  . Transportation needs:    Medical: Not on file    Non-medical: Not on file  Tobacco Use  . Smoking status: Never Smoker  . Smokeless tobacco: Never Used  . Tobacco comment: Never smoked  Substance and Sexual Activity  . Alcohol use: No    Alcohol/week: 0.0 oz  . Drug use: No  . Sexual activity: Yes    Birth control/protection: None, Post-menopausal  Lifestyle  . Physical activity:    Days per week: Not on file    Minutes per session: Not on file  . Stress: Not on file  Relationships  . Social connections:    Talks on phone: Not on file    Gets together: Not on file    Attends religious service: Not on file    Active member of club  or organization: Not on file    Attends meetings of clubs or organizations: Not on file    Relationship status: Not on file  Other Topics Concern  . Not on file  Social History Narrative  . Not on file    Past Medical History:  Diagnosis Date  . Anemia   . Fibroid uterus   . GERD (gastroesophageal reflux disease)   . Hyperlipidemia    Borderline  . Pre-diabetes    borderline     Patient Active Problem List   Diagnosis Date Noted  . S/P abdominal supracervical subtotal hysterectomy 11/06/2016  . Colon cancer screening   . Gastroesophageal reflux disease with esophagitis   . Anemia 08/04/2014  . GERD (gastroesophageal reflux disease) 08/04/2014  . Shoulder strain 07/26/2014  . Degenerative arthritis of thumb 07/26/2014  . Hoarse voice quality 02/28/2014  . Left knee pain 02/28/2014  . Hip pain 04/15/2013  . Epicondylitis, lateral (tennis elbow) 04/13/2013  . Constipation 09/15/2012  . Hyperlipidemia 11/28/2011  . Overweight (BMI 25.0-29.9) 09/15/2011  . ALLERGIC RHINITIS, SEASONAL 11/13/2007    Past Surgical History:  Procedure Laterality Date  . CHOLECYSTECTOMY    . COLONOSCOPY  2003   Dr. Gala Romney: Anal canal hemorrhoids  . COLONOSCOPY N/A 08/25/2014   Procedure: COLONOSCOPY;  Surgeon: Daneil Dolin, MD;  Location: [facility];  Service: Endoscopy;  Laterality: N/A;  1200  . ESOPHAGOGASTRODUODENOSCOPY N/A 08/25/2014   Procedure: ESOPHAGOGASTRODUODENOSCOPY (EGD);  Surgeon: Daneil Dolin, MD;  Location: [facility];  Service: Endoscopy;  Laterality: N/A;  . SALPINGOOPHORECTOMY Bilateral 11/06/2016   Procedure: BILATERAL SALPINGO OOPHORECTOMY;  Surgeon: Florian Buff, MD;  Location: [facility];  Service: Gynecology;  Laterality: Bilateral;  . SUPRACERVICAL ABDOMINAL HYSTERECTOMY N/A 11/06/2016   Procedure: HYSTERECTOMY SUPRACERVICAL ABDOMINAL;  Surgeon: Florian Buff, MD;  Location: [facility];  Service: Gynecology;  Laterality: N/A;  . TUBAL LIGATION      Family History         Family Status  Relation Name Status  . Mother  Deceased  . Father  Alive  . Sister  Alive  . Sister  Alive  . MGM  Deceased  . Mat Aunt  (Not Specified)  . Daughter  Alive  . Daughter  Alive  . Daughter  Alive  . Neg Hx  (Not Specified)        Her family history includes Cancer in her maternal aunt and maternal grandmother; Hypertension in her maternal aunt. There is no history of Colon cancer.      No Known Allergies   Current Outpatient Medications:  .  estradiol (ESTRACE) 0.1 MG/GM vaginal cream, Place 2 g vaginally as directed. Use 2 grams every other night, Disp: 30 g, Rfl: 12 .  estradiol (VIVELLE-DOT) 0.1 MG/24HR patch, Place 1 patch (0.1 mg total) onto the skin 2 (two) times a week., Disp: 8 patch, Rfl: 12 .  gabapentin (NEURONTIN) 300 MG capsule, Take 300 mg by mouth 3 (three) times daily., Disp: , Rfl:  .  meloxicam (MOBIC) 7.5 MG tablet, Take 1 tablet (7.5 mg total) by mouth daily., Disp: 30 tablet, Rfl: 0   Patient Care Team: Lester, Modena Nunnery, MD as PCP - General (Family Medicine) Daneil Dolin, MD as Consulting Physician (Gastroenterology)      Objective:   Vitals: BP 110/78   Pulse 60   Temp (!) 97.5 F (36.4 C) (Oral)   Resp 14   Ht 5\' 3"  (1.6 m)   Wt 182 lb (82.6 kg)   LMP 10/26/2016 (Exact Date)   SpO2 96%   BMI 32.24 kg/m    Vitals:   12/10/17 1042  BP: 110/78  Pulse: 60  Resp: 14  Temp: (!) 97.5 F (36.4 C)  TempSrc: Oral  SpO2: 96%  Weight: 182 lb (82.6 kg)  Height: 5\' 3"  (1.6 m)     Physical Exam  Constitutional: She is oriented to person, place, and time. She appears well-developed and well-nourished.  Non-toxic appearance. No distress.  HENT:  Head: Normocephalic and atraumatic.  Right Ear: External ear normal.  Left Ear: External ear normal.  Nose: Nose normal.  Mouth/Throat: Uvula is midline, oropharynx is clear and moist and mucous membranes are normal.  Eyes: Pupils are equal, round, and reactive to light.  Conjunctivae, EOM and lids are normal.  Neck: Normal range of motion and phonation normal. Neck supple. No tracheal deviation present.  Cardiovascular: Normal rate, regular rhythm, normal heart sounds and normal pulses. Exam reveals no gallop and no friction rub.  No murmur heard. Pulses:      Radial pulses are 2+ on the right side, and 2+ on the left side.       Posterior tibial pulses are 2+ on the right side, and 2+ on the left side.  Pulmonary/Chest: Effort normal and breath sounds normal. No stridor. No respiratory distress. She has no wheezes. She has no rhonchi. She has no rales. She exhibits no tenderness.  Abdominal: Soft. Normal appearance and bowel sounds are normal. She exhibits no distension. There is no tenderness. There is no rebound and no guarding.  Musculoskeletal: Normal range of motion. She exhibits no edema or deformity.  Lymphadenopathy:    She has no cervical adenopathy.  Neurological: She is alert and oriented to person, place, and time. She exhibits normal muscle tone. Gait normal.  Skin: Skin is warm, dry and intact. Capillary refill takes less than 2 seconds. No rash noted. She is not diaphoretic. No pallor.  Psychiatric: She has a normal mood and affect. Her speech is normal and behavior is normal.     Depression Screen PHQ 2/9 Scores 12/10/2016 10/18/2016 08/30/2016 10/02/2015  PHQ - 2 Score 0 3 3 0  PHQ- 9 Score 6 9 6  0      Assessment & Plan:     Routine Health Maintenance and Physical Exam  Exercise Activities and Dietary recommendations Goals    . DIET - REDUCE CALORIE INTAKE     Pt has limited mobility with back pain, will start to track her calories reduce her caloric intake to try and manage her weight.  Immunization History  Administered Date(s) Administered  . Hep A / Hep B 11/17/2006, 01/21/2007, 07/01/2007  . Influenza Whole 06/05/2005  . Influenza-Unspecified 03/18/2014, 03/02/2016  . Td 03/26/1989, 05/21/2005  . Tdap 03/29/2016     Health Maintenance  Topic Date Due  . HIV Screening  05/17/1977  . INFLUENZA VACCINE  01/08/2018  . MAMMOGRAM  05/27/2019  . PAP SMEAR  09/03/2019  . COLONOSCOPY  08/24/2024  . TETANUS/TDAP  03/29/2026  . Hepatitis C Screening  Completed     Discussed health benefits of physical activity, and encouraged her to engage in regular exercise appropriate for her age and condition.    -------------------------------------------------------------------- Problem List Items Addressed This Visit      Other   S/P abdominal supracervical subtotal hysterectomy   BMI 32.0-32.9,adult    Other Visit Diagnoses    General medical exam    -  Primary   Relevant Orders   Hemoglobin A1c   CBC with Differential/Platelet   COMPLETE METABOLIC PANEL WITH GFR   Lipid panel   Urinalysis, Routine w reflex microscopic (Completed)   HIV antibody   MM Digital Screening   TSH   Microscopic Message (Completed)   Screening for osteoporosis       Relevant Orders   DG Bone Density   S/P BSO (bilateral salpingo-oophorectomy)       Relevant Orders   DG Bone Density   Fall, sequela       Strain of lumbar region, subsequent encounter       Relevant Medications   cyclobenzaprine (FLEXERIL) 10 MG tablet   Unspecified condition associated with female genital organs and menstrual cycle       Relevant Orders   MR Pelvis W Wo Contrast   Screening for HIV (human immunodeficiency virus)       Relevant Orders   HIV antibody       Delsa Grana, PA-C 12/10/17 11:02 AM  [facility]

## 2017-12-10 NOTE — Telephone Encounter (Signed)
Pt needs to know what cream/lotion/ointment is going to be called in or what she can pick up over the counter for her dry skin on her feet.

## 2017-12-10 NOTE — Patient Instructions (Addendum)
Call us in August or September to get the flu shot  Mammogram due in December  I will follow up with your OBGYN regarding recent visit here.  We will call you with your results in the next week.  Preventive Care 40-64 Years, Female Preventive care refers to lifestyle choices and visits with your health care provider that can promote health and wellness. What does preventive care include?  A yearly physical exam. This is also called an annual well check.  Dental exams once or twice a year.  Routine eye exams. Ask your health care provider how often you should have your eyes checked.  Personal lifestyle choices, including: ? Daily care of your teeth and gums. ? Regular physical activity. ? Eating a healthy diet. ? Avoiding tobacco and drug use. ? Limiting alcohol use. ? Practicing safe sex. ? Taking low-dose aspirin daily starting at age 48. ? Taking vitamin and mineral supplements as recommended by your health care provider. What happens during an annual well check? The services and screenings done by your health care provider during your annual well check will depend on your age, overall health, lifestyle risk factors, and family history of disease. Counseling Your health care provider may ask you questions about your:  Alcohol use.  Tobacco use.  Drug use.  Emotional well-being.  Home and relationship well-being.  Sexual activity.  Eating habits.  Work and work Statistician.  Method of birth control.  Menstrual cycle.  Pregnancy history.  Screening You may have the following tests or measurements:  Height, weight, and BMI.  Blood pressure.  Lipid and cholesterol levels. These may be checked every 5 years, or more frequently if you are over 12 years old.  Skin check.  Lung cancer screening. You may have this screening every year starting at age 104 if you have a 30-pack-year history of smoking and currently smoke or have quit within the past 15  years.  Fecal occult blood test (FOBT) of the stool. You may have this test every year starting at age 22.  Flexible sigmoidoscopy or colonoscopy. You may have a sigmoidoscopy every 5 years or a colonoscopy every 10 years starting at age 41.  Hepatitis C blood test.  Hepatitis B blood test.  Sexually transmitted disease (STD) testing.  Diabetes screening. This is done by checking your blood sugar (glucose) after you have not eaten for a while (fasting). You may have this done every 1-3 years.  Mammogram. This may be done every 1-2 years. Talk to your health care provider about when you should start having regular mammograms. This may depend on whether you have a family history of breast cancer.  BRCA-related cancer screening. This may be done if you have a family history of breast, ovarian, tubal, or peritoneal cancers.  Pelvic exam and Pap test. This may be done every 3 years starting at age 68. Starting at age 52, this may be done every 5 years if you have a Pap test in combination with an HPV test.  Bone density scan. This is done to screen for osteoporosis. You may have this scan if you are at high risk for osteoporosis.  Discuss your test results, treatment options, and if necessary, the need for more tests with your health care provider. Vaccines Your health care provider may recommend certain vaccines, such as:  Influenza vaccine. This is recommended every year.  Tetanus, diphtheria, and acellular pertussis (Tdap, Td) vaccine. You may need a Td booster every 10 years.  Varicella vaccine.  You may need this if you have not been vaccinated.  Zoster vaccine. You may need this after age 21.  Measles, mumps, and rubella (MMR) vaccine. You may need at least one dose of MMR if you were born in 1957 or later. You may also need a second dose.  Pneumococcal 13-valent conjugate (PCV13) vaccine. You may need this if you have certain conditions and were not previously  vaccinated.  Pneumococcal polysaccharide (PPSV23) vaccine. You may need one or two doses if you smoke cigarettes or if you have certain conditions.  Meningococcal vaccine. You may need this if you have certain conditions.  Hepatitis A vaccine. You may need this if you have certain conditions or if you travel or work in places where you may be exposed to hepatitis A.  Hepatitis B vaccine. You may need this if you have certain conditions or if you travel or work in places where you may be exposed to hepatitis B.  Haemophilus influenzae type b (Hib) vaccine. You may need this if you have certain conditions.  Talk to your health care provider about which screenings and vaccines you need and how often you need them. This information is not intended to replace advice given to you by your health care provider. Make sure you discuss any questions you have with your health care provider. Document Released: 06/23/2015 Document Revised: 02/14/2016 Document Reviewed: 03/28/2015 Elsevier Interactive Patient Education  2018 [place] [employer] Calories are units of energy. Your body needs a certain amount of calories from food to keep you going throughout the day. When you eat more calories than your body needs, your body stores the extra calories as fat. When you eat fewer calories than your body needs, your body burns fat to get the energy it needs. Calorie counting means keeping track of how many calories you eat and drink each day. Calorie counting can be helpful if you need to lose weight. If you make sure to eat fewer calories than your body needs, you should lose weight. Ask your health care provider what a healthy weight is for you. For calorie counting to work, you will need to eat the right number of calories in a day in order to lose a healthy amount of weight per week. A dietitian can help you determine how many calories you need in a day and will give you suggestions  on how to reach your calorie goal.  A healthy amount of weight to lose per week is usually 1-2 lb (0.5-0.9 kg). This usually means that your daily calorie intake should be reduced by 500-750 calories.  Eating 1,200 - 1,500 calories per day can help most women lose weight.  Eating 1,500 - 1,800 calories per day can help most men lose weight.  What is my plan? My goal is to have __________ calories per day. If I have this many calories per day, I should lose around __________ pounds per week. What do I need to know about calorie counting? In order to meet your daily calorie goal, you will need to:  Find out how many calories are in each food you would like to eat. Try to do this before you eat.  Decide how much of the food you plan to eat.  Write down what you ate and how many calories it had. Doing this is called keeping a food log.  To successfully lose weight, it is important to balance calorie counting with a healthy lifestyle that  includes regular activity. Aim for 150 minutes of moderate exercise (such as walking) or 75 minutes of vigorous exercise (such as running) each week. Where do I find calorie information?  The number of calories in a food can be found on a Nutrition Facts label. If a food does not have a Nutrition Facts label, try to look up the calories online or ask your dietitian for help. Remember that calories are listed per serving. If you choose to have more than one serving of a food, you will have to multiply the calories per serving by the amount of servings you plan to eat. For example, the label on a package of bread might say that a serving size is 1 slice and that there are 90 calories in a serving. If you eat 1 slice, you will have eaten 90 calories. If you eat 2 slices, you will have eaten 180 calories. How do I keep a food log? Immediately after each meal, record the following information in your food log:  What you ate. Don't forget to include toppings,  sauces, and other extras on the food.  How much you ate. This can be measured in cups, ounces, or number of items.  How many calories each food and drink had.  The total number of calories in the meal.  Keep your food log near you, such as in a small notebook in your pocket, or use a mobile app or website. Some programs will calculate calories for you and show you how many calories you have left for the day to meet your goal. What are some calorie counting tips?  Use your calories on foods and drinks that will fill you up and not leave you hungry: ? Some examples of foods that fill you up are nuts and nut butters, vegetables, lean proteins, and high-fiber foods like whole grains. High-fiber foods are foods with more than 5 g fiber per serving. ? Drinks such as sodas, specialty coffee drinks, alcohol, and juices have a lot of calories, yet do not fill you up.  Eat nutritious foods and avoid empty calories. Empty calories are calories you get from foods or beverages that do not have many vitamins or protein, such as candy, sweets, and soda. It is better to have a nutritious high-calorie food (such as an avocado) than a food with few nutrients (such as a bag of chips).  Know how many calories are in the foods you eat most often. This will help you calculate calorie counts faster.  Pay attention to calories in drinks. Low-calorie drinks include water and unsweetened drinks.  Pay attention to nutrition labels for "low fat" or "fat free" foods. These foods sometimes have the same amount of calories or more calories than the full fat versions. They also often have added sugar, starch, or salt, to make up for flavor that was removed with the fat.  Find a way of tracking calories that works for you. Get creative. Try different apps or programs if writing down calories does not work for you. What are some portion control tips?  Know how many calories are in a serving. This will help you know how many  servings of a certain food you can have.  Use a measuring cup to measure serving sizes. You could also try weighing out portions on a kitchen scale. With time, you will be able to estimate serving sizes for some foods.  Take some time to put servings of different foods on your favorite plates, bowls,  and cups so you know what a serving looks like.  Try not to eat straight from a bag or box. Doing this can lead to overeating. Put the amount you would like to eat in a cup or on a plate to make sure you are eating the right portion.  Use smaller plates, glasses, and bowls to prevent overeating.  Try not to multitask (for example, watch TV or use your computer) while eating. If it is time to eat, sit down at a table and enjoy your food. This will help you to know when you are full. It will also help you to be aware of what you are eating and how much you are eating. What are tips for following this plan? Reading food labels  Check the calorie count compared to the serving size. The serving size may be smaller than what you are used to eating.  Check the source of the calories. Make sure the food you are eating is high in vitamins and protein and low in saturated and trans fats. Shopping  Read nutrition labels while you shop. This will help you make healthy decisions before you decide to purchase your food.  Make a grocery list and stick to it. Cooking  Try to cook your favorite foods in a healthier way. For example, try baking instead of frying.  Use low-fat dairy products. Meal planning  Use more fruits and vegetables. Half of your plate should be fruits and vegetables.  Include lean proteins like poultry and fish. How do I count calories when eating out?  Ask for smaller portion sizes.  Consider sharing an entree and sides instead of getting your own entree.  If you get your own entree, eat only half. Ask for a box at the beginning of your meal and put the rest of your entree in  it so you are not tempted to eat it.  If calories are listed on the menu, choose the lower calorie options.  Choose dishes that include vegetables, fruits, whole grains, low-fat dairy products, and lean protein.  Choose items that are boiled, broiled, grilled, or steamed. Stay away from items that are buttered, battered, fried, or served with cream sauce. Items labeled "crispy" are usually fried, unless stated otherwise.  Choose water, low-fat milk, unsweetened iced tea, or other drinks without added sugar. If you want an alcoholic beverage, choose a lower calorie option such as a glass of wine or light beer.  Ask for dressings, sauces, and syrups on the side. These are usually high in calories, so you should limit the amount you eat.  If you want a salad, choose a garden salad and ask for grilled meats. Avoid extra toppings like bacon, cheese, or fried items. Ask for the dressing on the side, or ask for olive oil and vinegar or lemon to use as dressing.  Estimate how many servings of a food you are given. For example, a serving of cooked rice is  cup or about the size of half a baseball. Knowing serving sizes will help you be aware of how much food you are eating at restaurants. The list below tells you how big or small some common portion sizes are based on everyday objects: ? 1 oz-4 stacked dice. ? 3 oz-1 deck of cards. ? 1 tsp-1 die. ? 1 Tbsp- a ping-pong ball. ? 2 Tbsp-1 ping-pong ball. ?  cup- baseball. ? 1 cup-1 baseball. Summary  Calorie counting means keeping track of how many calories you eat and  drink each day. If you eat fewer calories than your body needs, you should lose weight.  A healthy amount of weight to lose per week is usually 1-2 lb (0.5-0.9 kg). This usually means reducing your daily calorie intake by 500-750 calories.  The number of calories in a food can be found on a Nutrition Facts label. If a food does not have a Nutrition Facts label, try to look up the  calories online or ask your dietitian for help.  Use your calories on foods and drinks that will fill you up, and not on foods and drinks that will leave you hungry.  Use smaller plates, glasses, and bowls to prevent overeating. This information is not intended to replace advice given to you by your health care provider. Make sure you discuss any questions you have with your health care provider. Document Released: 05/27/2005 Document Revised: 04/26/2016 Document Reviewed: 04/26/2016 Elsevier Interactive Patient Education  [employer].

## 2017-12-11 LAB — LIPID PANEL
CHOL/HDL RATIO: 4.3 (calc) (ref <5.0)
CHOLESTEROL: 216 mg/dL — AB (ref <200)
HDL: 50 mg/dL — AB (ref >50)
LDL Cholesterol (Calc): 147 mg/dL (calc) — ABNORMAL HIGH
NON-HDL CHOLESTEROL (CALC): 166 mg/dL — AB (ref <130)
Triglycerides: 83 mg/dL (ref <150)

## 2017-12-11 LAB — HEMOGLOBIN A1C
EAG (MMOL/L): 6.6 (calc)
HEMOGLOBIN A1C: 5.8 %{Hb} — AB (ref <5.7)
MEAN PLASMA GLUCOSE: 120 (calc)

## 2017-12-11 LAB — CBC WITH DIFFERENTIAL/PLATELET
BASOS ABS: 50 {cells}/uL (ref 0–200)
Basophils Relative: 1.2 %
EOS ABS: 244 {cells}/uL (ref 15–500)
EOS PCT: 5.8 %
HCT: 37.4 % (ref 35.0–45.0)
Hemoglobin: 12.6 g/dL (ref 11.7–15.5)
Lymphs Abs: 1781 cells/uL (ref 850–3900)
MCH: 30.8 pg (ref 27.0–33.0)
MCHC: 33.7 g/dL (ref 32.0–36.0)
MCV: 91.4 fL (ref 80.0–100.0)
MONOS PCT: 10.6 %
MPV: 12.1 fL (ref 7.5–12.5)
Neutro Abs: 1680 cells/uL (ref 1500–7800)
Neutrophils Relative %: 40 %
Platelets: 212 10*3/uL (ref 140–400)
RBC: 4.09 10*6/uL (ref 3.80–5.10)
RDW: 13 % (ref 11.0–15.0)
Total Lymphocyte: 42.4 %
WBC mixed population: 445 cells/uL (ref 200–950)
WBC: 4.2 10*3/uL (ref 3.8–10.8)

## 2017-12-11 LAB — COMPLETE METABOLIC PANEL WITH GFR
AG Ratio: 1.3 (calc) (ref 1.0–2.5)
ALBUMIN MSPROF: 4 g/dL (ref 3.6–5.1)
ALT: 12 U/L (ref 6–29)
AST: 20 U/L (ref 10–35)
Alkaline phosphatase (APISO): 77 U/L (ref 33–130)
BILIRUBIN TOTAL: 0.6 mg/dL (ref 0.2–1.2)
BUN: 11 mg/dL (ref 7–25)
CO2: 22 mmol/L (ref 20–32)
CREATININE: 0.98 mg/dL (ref 0.50–1.05)
Calcium: 9.1 mg/dL (ref 8.6–10.4)
Chloride: 105 mmol/L (ref 98–110)
GFR, Est African American: 75 mL/min/{1.73_m2} (ref >=60)
GFR, Est Non African American: 65 mL/min/{1.73_m2} (ref >=60)
GLOBULIN: 3.2 g/dL (ref 1.9–3.7)
GLUCOSE: 91 mg/dL (ref 65–99)
Potassium: 4 mmol/L (ref 3.5–5.3)
SODIUM: 137 mmol/L (ref 135–146)
Total Protein: 7.2 g/dL (ref 6.1–8.1)

## 2017-12-11 LAB — HIV ANTIBODY (ROUTINE TESTING W REFLEX): HIV: NONREACTIVE

## 2017-12-12 ENCOUNTER — Encounter: Payer: BLUE CROSS/BLUE SHIELD | Admitting: Family Medicine

## 2017-12-12 NOTE — Progress Notes (Signed)
Please give lab results: Kidney, liver function normal, no anemia seen, thyroid normal, A1C still shows prediabetes, but it is better than last labs (three years ago), cholesterol is too high. Avoid fried foods, fatty foods, eat low cholesterol foods- fresh fruits and veggies

## 2017-12-15 ENCOUNTER — Encounter: Payer: Self-pay | Admitting: *Deleted

## 2017-12-30 DIAGNOSIS — J029 Acute pharyngitis, unspecified: Secondary | ICD-10-CM | POA: Diagnosis not present

## 2017-12-30 DIAGNOSIS — R030 Elevated blood-pressure reading, without diagnosis of hypertension: Secondary | ICD-10-CM | POA: Diagnosis not present

## 2017-12-30 DIAGNOSIS — J358 Other chronic diseases of tonsils and adenoids: Secondary | ICD-10-CM | POA: Diagnosis not present

## 2018-01-13 ENCOUNTER — Other Ambulatory Visit: Payer: Self-pay | Admitting: Family Medicine

## 2018-01-13 DIAGNOSIS — Z13820 Encounter for screening for osteoporosis: Secondary | ICD-10-CM

## 2018-01-13 DIAGNOSIS — E2839 Other primary ovarian failure: Secondary | ICD-10-CM

## 2018-01-13 DIAGNOSIS — Z90722 Acquired absence of ovaries, bilateral: Secondary | ICD-10-CM

## 2018-04-14 ENCOUNTER — Ambulatory Visit: Payer: BLUE CROSS/BLUE SHIELD | Admitting: Family Medicine

## 2018-04-14 ENCOUNTER — Other Ambulatory Visit: Payer: Self-pay

## 2018-04-14 ENCOUNTER — Encounter: Payer: Self-pay | Admitting: Family Medicine

## 2018-04-14 VITALS — BP 140/82 | HR 82 | Temp 98.1°F | Resp 16 | Ht 63.0 in | Wt 184.0 lb

## 2018-04-14 DIAGNOSIS — R030 Elevated blood-pressure reading, without diagnosis of hypertension: Secondary | ICD-10-CM

## 2018-04-14 DIAGNOSIS — S39012D Strain of muscle, fascia and tendon of lower back, subsequent encounter: Secondary | ICD-10-CM

## 2018-04-14 MED ORDER — CYCLOBENZAPRINE HCL 10 MG PO TABS: 10.0000 mg | ORAL_TABLET | Freq: Three times a day (TID) | ORAL | 0 refills | Status: DC | PRN

## 2018-04-14 MED ORDER — LISINOPRIL 10 MG PO TABS: 10.0000 mg | ORAL_TABLET | Freq: Every day | ORAL | 3 refills | Status: DC

## 2018-04-14 NOTE — Patient Instructions (Signed)
Monitor BP and if on average it is over >140/90 start the medicine and return for recheck and follow up in a few weeks  Goal would be <130/80  If you have high pressure with head aches, chest pain or any other concerning symptoms get seen right away   Preventing Hypertension Hypertension, commonly called high blood pressure, is when the force of blood pumping through the arteries is too strong. Arteries are blood vessels that carry blood from the heart throughout the body. Over time, hypertension can damage the arteries and decrease blood flow to important parts of the body, including the brain, heart, and kidneys. Often, hypertension does not cause symptoms until blood pressure is very high. For this reason, it is important to have your blood pressure checked on a regular basis. Hypertension can often be prevented with diet and lifestyle changes. If you already have hypertension, you can control it with diet and lifestyle changes, as well as medicine. What nutrition changes can be made? Maintain a healthy diet. This includes:  Eating less salt (sodium). Ask your health care provider how much sodium is safe for you to have. The general recommendation is to consume less than 1 tsp (2,300 mg) of sodium a day. ? Do not add salt to your food. ? Choose low-sodium options when grocery shopping and eating out.  Limiting fats in your diet. You can do this by eating low-fat or fat-free dairy products and by eating less red meat.  Eating more fruits, vegetables, and whole grains. Make a goal to eat: ? 1-2 cups of fresh fruits and vegetables each day. ? 3-4 servings of whole grains each day.  Avoiding foods and beverages that have added sugars.  Eating fish that contain healthy fats (omega-3 fatty acids), such as mackerel or salmon.  If you need help putting together a healthy eating plan, try the DASH diet. This diet is high in fruits, vegetables, and whole grains. It is low in sodium, red meat,  and added sugars. DASH stands for Dietary Approaches to Stop Hypertension. What lifestyle changes can be made?  Lose weight if you are overweight. Losing just 3?5% of your body weight can help prevent or control hypertension. ? For example, if your present weight is 200 lb (91 kg), a loss of 3-5% of your weight means losing 6-10 lb (2.7-4.5 kg). ? Ask your health care provider to help you with a diet and exercise plan to safely lose weight.  Get enough exercise. Do at least 150 minutes of moderate-intensity exercise each week. ? You could do this in short exercise sessions several times a day, or you could do longer exercise sessions a few times a week. For example, you could take a brisk 10-minute walk or bike ride, 3 times a day, for 5 days a week.  Find ways to reduce stress, such as exercising, meditating, listening to music, or taking a yoga class. If you need help reducing stress, ask your health care provider.  Do not smoke. This includes e-cigarettes. Chemicals in tobacco and nicotine products raise your blood pressure each time you smoke. If you need help quitting, ask your health care provider.  Avoid alcohol. If you drink alcohol, limit alcohol intake to no more than 1 drink a day for nonpregnant women and 2 drinks a day for men. One drink equals 12 oz of beer, 5 oz of wine, or 1 oz of hard liquor. Why are these changes important? Diet and lifestyle changes can help you prevent hypertension,  and they may make you feel better overall and improve your quality of life. If you have hypertension, making these changes will help you control it and help prevent major complications, such as:  Hardening and narrowing of arteries that supply blood to: ? Your heart. This can cause a heart attack. ? Your brain. This can cause a stroke. ? Your kidneys. This can cause kidney failure.  Stress on your heart muscle, which can cause heart failure.  What can I do to lower my risk?  Work with your  health care provider to make a hypertension prevention plan that works for you. Follow your plan and keep all follow-up visits as told by your health care provider.  Learn how to check your blood pressure at home. Make sure that you know your personal target blood pressure, as told by your health care provider. How is this treated? In addition to diet and lifestyle changes, your health care provider may recommend medicines to help lower your blood pressure. You may need to try a few different medicines to find what works best for you. You also may need to take more than one medicine. Take over-the-counter and prescription medicines only as told by your health care provider. Where to find support: Your health care provider can help you prevent hypertension and help you keep your blood pressure at a healthy level. Your local hospital or your community may also provide support services and prevention programs. The American Heart Association offers an online support network at: CheapBootlegs.com.cy Where to find more information: Learn more about hypertension from:  National Heart, Lung, and Blood Institute: ElectronicHangman.is  Centers for Disease Control and Prevention: https://ingram.com/  American Academy of Family Physicians: http://familydoctor.org/familydoctor/en/diseases-conditions/high-blood-pressure.printerview.all.html  Learn more about the DASH diet from:  [place] [place]: https://www.reyes.com/  Contact a health care provider if:  You think you are having a reaction to medicines you have taken.  You have recurrent headaches or feel dizzy.  You have swelling in your ankles.  You have trouble with your vision. Summary  Hypertension often does not cause any symptoms until blood pressure is very high. It is important to get your blood pressure checked  regularly.  Diet and lifestyle changes are the most important steps in preventing hypertension.  By keeping your blood pressure in a healthy range, you can prevent complications like heart attack, heart failure, stroke, and kidney failure.  Work with your health care provider to make a hypertension prevention plan that works for you. This information is not intended to replace advice given to you by your health care provider. Make sure you discuss any questions you have with your health care provider. Document Released: 06/11/2015 Document Revised: 02/05/2016 Document Reviewed: 02/05/2016 Elsevier Interactive Patient Education  [employer].

## 2018-04-14 NOTE — Progress Notes (Signed)
Patient ID: Jacqueline [name], female    DOB: [birth year], 57 y.o.   MRN: [id number]  PCP: Alycia Rossetti, MD  Chief Complaint  Patient presents with  . Elevated Blood Pressure    BP rinning high, but not sure if meter is ok  . Foot Issues    bottom of feet peeling    Subjective:   Jacqueline [name] is a 57 y.o. female, presents to clinic with CC of high blood pressure.  She has been monitoring at home, reports it has been running high after she started checking it - SBP ~ 140's. Her aunt just gave her a BP cuff for a halloween for an outfit so she started using it.  She denies any symptoms correlated with higher readings.  She believes she has had some gradual weight gain, her blood pressure usually is much lower in the 110-120.  She denies headache, visual disturbances, near-syncope, chest pain, shortness of breath, lower extremity edema.   BP Readings from Last 5 Encounters:  04/14/18 140/82  12/10/17 110/78  11/20/17 118/70  11/05/17 128/82  12/10/16 112/62    Wt Readings from Last 5 Encounters:  04/14/18 184 lb (83.5 kg)  12/10/17 182 lb (82.6 kg)  11/20/17 184 lb (83.5 kg)  11/05/17 184 lb (83.5 kg)  12/10/16 165 lb (74.8 kg)   She also complains of continued chronic low back pain and with sciatica, currently 5/10 at rest, exacerbated with movement, laughing and sneezing.    Patient Active Problem List   Diagnosis Date Noted  . BMI 32.0-32.9,adult 12/10/2017  . S/P abdominal supracervical subtotal hysterectomy 11/06/2016  . Colon cancer screening   . Gastroesophageal reflux disease with esophagitis   . Anemia 08/04/2014  . GERD (gastroesophageal reflux disease) 08/04/2014  . Shoulder strain 07/26/2014  . Degenerative arthritis of thumb 07/26/2014  . Hoarse voice quality 02/28/2014  . Left knee pain 02/28/2014  . Hip pain 04/15/2013  . Epicondylitis, lateral (tennis elbow) 04/13/2013  . Constipation 09/15/2012  . Hyperlipidemia 11/28/2011  . Obesity, Class I,  BMI 30.0-34.9 (see actual BMI) 09/15/2011  . ALLERGIC RHINITIS, SEASONAL 11/13/2007     Prior to Admission medications   Medication Sig Start Date End Date Taking? Authorizing Provider  cyclobenzaprine (FLEXERIL) 10 MG tablet Take 1 tablet (10 mg total) by mouth 3 (three) times daily as needed for muscle spasms. 12/10/17  Yes Delsa Grana, PA-C  estradiol (ESTRACE) 0.1 MG/GM vaginal cream Place 2 g vaginally as directed. Use 2 grams every other night 11/20/17  Yes Florian Buff, MD  estradiol (VIVELLE-DOT) 0.1 MG/24HR patch Place 1 patch (0.1 mg total) onto the skin 2 (two) times a week. 11/20/17  Yes Florian Buff, MD  gabapentin (NEURONTIN) 300 MG capsule Take 300 mg by mouth 3 (three) times daily.   Yes [provider]  meloxicam (MOBIC) 7.5 MG tablet Take 1 tablet (7.5 mg total) by mouth daily. 10/18/16  Yes [place], Modena Nunnery, MD     No Known Allergies   Family History  Problem Relation Age of Onset  . Cancer Maternal Grandmother         lymphnodes  . Hypertension Maternal Aunt   . Cancer Maternal Aunt        Breast  . Colon cancer Neg Hx      Social History   Socioeconomic History  . Marital status: Single    Spouse name: Not on file  . Number of children: 3  . Years  of education: Not on file  . Highest education level: Not on file  Occupational History  . Not on file  Social Needs  . Financial resource strain: Not on file  . Food insecurity:    Worry: Not on file    Inability: Not on file  . Transportation needs:    Medical: Not on file    Non-medical: Not on file  Tobacco Use  . Smoking status: Never Smoker  . Smokeless tobacco: Never Used  . Tobacco comment: Never smoked  Substance and Sexual Activity  . Alcohol use: No    Alcohol/week: 0.0 standard drinks  . Drug use: No  . Sexual activity: Yes    Birth control/protection: None, Post-menopausal  Lifestyle  . Physical activity:    Days per week: Not on file    Minutes per session: Not on file    . Stress: Not on file  Relationships  . Social connections:    Talks on phone: Not on file    Gets together: Not on file    Attends religious service: Not on file    Active member of club or organization: Not on file    Attends meetings of clubs or organizations: Not on file    Relationship status: Not on file  . Intimate partner violence:    Fear of current or ex partner: Not on file    Emotionally abused: Not on file    Physically abused: Not on file    Forced sexual activity: Not on file  Other Topics Concern  . Not on file  Social History Narrative  . Not on file     Review of Systems  Constitutional: Negative.   HENT: Negative.   Eyes: Negative.   Respiratory: Negative.  Negative for apnea, cough, choking, chest tightness, shortness of breath, wheezing and stridor.   Cardiovascular: Negative.  Negative for chest pain, palpitations and leg swelling.  Gastrointestinal: Negative.  Negative for abdominal pain, diarrhea, nausea and vomiting.  Endocrine: Negative.   Genitourinary: Negative.   Musculoskeletal: Positive for back pain. Negative for arthralgias, gait problem, joint swelling, myalgias, neck pain and neck stiffness.  Skin: Negative.   Allergic/Immunologic: Negative.   Neurological: Negative.  Negative for dizziness, weakness, light-headedness, numbness and headaches.  Hematological: Negative.   Psychiatric/Behavioral: Negative.   All other systems reviewed and are negative.      Objective:    Vitals:   04/14/18 1526  BP: 140/82  Pulse: 82  Resp: 16  Temp: 98.1 F (36.7 C)  TempSrc: Oral  SpO2: 98%  Weight: 184 lb (83.5 kg)  Height: 5\' 3"  (1.6 m)      Physical Exam  Constitutional: She appears well-developed and well-nourished. No distress.  HENT:  Head: Normocephalic and atraumatic.  Nose: Nose normal.  Mouth/Throat: Oropharynx is clear and moist.  Eyes: Pupils are equal, round, and reactive to light. Conjunctivae and EOM are normal. Right  eye exhibits no discharge. Left eye exhibits no discharge.  Neck: Normal range of motion. No JVD present. No tracheal deviation present.  Cardiovascular: Normal rate, regular rhythm, normal heart sounds and intact distal pulses. Exam reveals no gallop and no friction rub.  No murmur heard. Pulmonary/Chest: Effort normal and breath sounds normal. No stridor. No respiratory distress. She has no wheezes. She has no rales. She exhibits no tenderness.  Abdominal: Soft. Bowel sounds are normal. She exhibits no distension. There is no tenderness. There is no guarding.  Musculoskeletal: Normal range of motion. She exhibits no  edema or deformity.       Thoracic back: She exhibits normal range of motion, no tenderness, no bony tenderness, no swelling, no edema and no spasm.       Lumbar back: She exhibits normal range of motion, no tenderness, no bony tenderness, no swelling, no edema, no pain and no spasm.  Neurological: She is alert. She has normal strength. No sensory deficit. She exhibits normal muscle tone. Coordination and gait normal.  5/5 strength to b/l LE with dorsiflexion plantarflexion Normal gait Grossly intact sensation to light touch to bilateral lower extremities  Skin: Skin is warm and dry. Capillary refill takes less than 2 seconds. No rash noted. She is not diaphoretic.  Psychiatric: She has a normal mood and affect. Her behavior is normal.  Nursing note and vitals reviewed.         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  She also has chronic back pain which is currently 5 out of 10    ICD-10-CM   1. Elevated BP without diagnosis of hypertension R03.0 lisinopril (PRINIVIL,ZESTRIL) 10 MG tablet    BASIC METABOLIC PANEL WITH GFR  2. Strain of lumbar region, subsequent encounter S39.012D cyclobenzaprine (FLEXERIL) 10 MG tablet    Blood  pressures mildly elevated relative to her baseline when I reviewed flow sheets and her history, asymptomatic, no concerning physical exam findings, doubt any chance of endorgan failure, will monitor, treat muscle spasms with Flexeril, encourage patient to monitor and if blood pressures remain on average greater than 130/80 then she did start low-dose lisinopril trial and she would need to follow-up with [place] in 2 to 4 weeks for blood pressure recheck.  Check BMP today.   Delsa Grana, PA-C 04/14/18 3:37 PM

## 2018-04-15 LAB — BASIC METABOLIC PANEL WITH GFR
BUN: 12 mg/dL (ref 7–25)
CALCIUM: 9.6 mg/dL (ref 8.6–10.4)
CO2: 31 mmol/L (ref 20–32)
CREATININE: 0.96 mg/dL (ref 0.50–1.05)
Chloride: 100 mmol/L (ref 98–110)
GFR, EST NON AFRICAN AMERICAN: 67 mL/min/{1.73_m2} (ref >=60)
GFR, Est African American: 77 mL/min/{1.73_m2} (ref >=60)
GLUCOSE: 99 mg/dL (ref 65–99)
Potassium: 3.4 mmol/L — ABNORMAL LOW (ref 3.5–5.3)
SODIUM: 135 mmol/L (ref 135–146)

## 2018-04-17 ENCOUNTER — Encounter: Payer: Self-pay | Admitting: *Deleted

## 2018-04-24 ENCOUNTER — Other Ambulatory Visit: Payer: Self-pay | Admitting: Family Medicine

## 2018-04-24 DIAGNOSIS — Z1231 Encounter for screening mammogram for malignant neoplasm of breast: Secondary | ICD-10-CM

## 2018-04-29 ENCOUNTER — Ambulatory Visit: Payer: BLUE CROSS/BLUE SHIELD | Admitting: Family Medicine

## 2018-04-29 VITALS — BP 118/70

## 2018-04-29 DIAGNOSIS — I10 Essential (primary) hypertension: Secondary | ICD-10-CM

## 2018-04-29 NOTE — Progress Notes (Signed)
Informed pt of great BP reading and to continue her medication. At her convenience she can bring her cuff by here and we will calibrate it for her to make sure it is right. Also informed her to rest for about 20 minutes prior to taking BP at home as stress can cause a rise in BP. Pt verbalized understanding.

## 2018-05-20 ENCOUNTER — Ambulatory Visit: Payer: BLUE CROSS/BLUE SHIELD | Admitting: Family Medicine

## 2018-05-22 ENCOUNTER — Ambulatory Visit (INDEPENDENT_AMBULATORY_CARE_PROVIDER_SITE_OTHER): Payer: BLUE CROSS/BLUE SHIELD | Admitting: Family Medicine

## 2018-05-22 ENCOUNTER — Encounter: Payer: Self-pay | Admitting: Family Medicine

## 2018-05-22 VITALS — BP 134/80 | HR 80 | Temp 97.9°F | Resp 14 | Ht 62.5 in | Wt 181.0 lb

## 2018-05-22 DIAGNOSIS — I10 Essential (primary) hypertension: Secondary | ICD-10-CM

## 2018-05-22 NOTE — Progress Notes (Signed)
Subjective:    Patient ID: Jacqueline [name], female    DOB: 1960-10-25, 57 y.o.   MRN: [id number]  HPI Presents today concerned about her blood pressure.  She has a blood pressure monitor that she received from a family member that has been giving her widely varying blood pressure.  Her systolic numbers have been all over the place and as high as 190.  Her diastolic blood pressures have been as high as 120.  My nurse checked her blood pressure today and it was well within normal limits.  I checked it in the left arm and found it to be 132/80.  I checked it in the right arm and found it to be 134/82.  Her blood pressure cuff reported her blood pressure today to be greater than 536 systolic over 92 Past Medical History:  Diagnosis Date  . Anemia   . Fibroid uterus   . GERD (gastroesophageal reflux disease)   . Hyperlipidemia    Borderline  . Pre-diabetes    borderline   Past Surgical History:  Procedure Laterality Date  . CHOLECYSTECTOMY    . COLONOSCOPY  2003   Dr. Gala Romney: Anal canal hemorrhoids  . COLONOSCOPY N/A 08/25/2014   Procedure: COLONOSCOPY;  Surgeon: Daneil Dolin, MD;  Location: [facility];  Service: Endoscopy;  Laterality: N/A;  1200  . ESOPHAGOGASTRODUODENOSCOPY N/A 08/25/2014   Procedure: ESOPHAGOGASTRODUODENOSCOPY (EGD);  Surgeon: Daneil Dolin, MD;  Location: [facility];  Service: Endoscopy;  Laterality: N/A;  . SALPINGOOPHORECTOMY Bilateral 11/06/2016   Procedure: BILATERAL SALPINGO OOPHORECTOMY;  Surgeon: Florian Buff, MD;  Location: [facility];  Service: Gynecology;  Laterality: Bilateral;  . SUPRACERVICAL ABDOMINAL HYSTERECTOMY N/A 11/06/2016   Procedure: HYSTERECTOMY SUPRACERVICAL ABDOMINAL;  Surgeon: Florian Buff, MD;  Location: [facility];  Service: Gynecology;  Laterality: N/A;  . TUBAL LIGATION     Current Outpatient Medications on File Prior to Visit  Medication Sig Dispense Refill  . cyclobenzaprine (FLEXERIL) 10 MG tablet Take 1 tablet (10 mg total) by mouth  3 (three) times daily as needed for muscle spasms. 30 tablet 0  . estradiol (ESTRACE) 0.1 MG/GM vaginal cream Place 2 g vaginally as directed. Use 2 grams every other night 30 g 12  . estradiol (VIVELLE-DOT) 0.1 MG/24HR patch Place 1 patch (0.1 mg total) onto the skin 2 (two) times a week. 8 patch 12  . gabapentin (NEURONTIN) 300 MG capsule Take 300 mg by mouth 3 (three) times daily.    Marland Kitchen lisinopril (PRINIVIL,ZESTRIL) 10 MG tablet Take 1 tablet (10 mg total) by mouth daily. 90 tablet 3  . meloxicam (MOBIC) 7.5 MG tablet Take 1 tablet (7.5 mg total) by mouth daily. 30 tablet 0   No current facility-administered medications on file prior to visit.    No Known Allergies Social History   Socioeconomic History  . Marital status: Single    Spouse name: Not on file  . Number of children: 3  . Years of education: Not on file  . Highest education level: Not on file  Occupational History  . Not on file  Social Needs  . Financial resource strain: Not on file  . Food insecurity:    Worry: Not on file    Inability: Not on file  . Transportation needs:    Medical: Not on file    Non-medical: Not on file  Tobacco Use  . Smoking status: Never Smoker  . Smokeless tobacco: Never Used  . Tobacco comment: Never smoked  Substance and Sexual Activity  . Alcohol use: No    Alcohol/week: 0.0 standard drinks  . Drug use: No  . Sexual activity: Yes    Birth control/protection: None, Post-menopausal  Lifestyle  . Physical activity:    Days per week: Not on file    Minutes per session: Not on file  . Stress: Not on file  Relationships  . Social connections:    Talks on phone: Not on file    Gets together: Not on file    Attends religious service: Not on file    Active member of club or organization: Not on file    Attends meetings of clubs or organizations: Not on file    Relationship status: Not on file  . Intimate partner violence:    Fear of current or ex partner: Not on file     Emotionally abused: Not on file    Physically abused: Not on file    Forced sexual activity: Not on file  Other Topics Concern  . Not on file  Social History Narrative  . Not on file      Review of Systems  All other systems reviewed and are negative.      Objective:   Physical Exam Vitals signs reviewed.  Cardiovascular:     Rate and Rhythm: Normal rate and regular rhythm.     Heart sounds: Normal heart sounds. No murmur.  Pulmonary:     Effort: Pulmonary effort is normal. No respiratory distress.     Breath sounds: Normal breath sounds. No wheezing, rhonchi or rales.  Musculoskeletal:     Right lower leg: No edema.     Left lower leg: No edema.  Neurological:     Mental Status: She is alert.           Assessment & Plan:  Hypertension, unspecified type  Patient's blood pressure here is well controlled.  I believe her cuff is completely incorrect and is giving the patient erroneous information.  I recommended that she buy a new blood pressure cuff.  As long as her blood pressures less than 140/90, I would not make any changes.  Patient is comfortable with this at the present time

## 2018-05-30 DIAGNOSIS — J029 Acute pharyngitis, unspecified: Secondary | ICD-10-CM | POA: Diagnosis not present

## 2018-05-30 DIAGNOSIS — M791 Myalgia, unspecified site: Secondary | ICD-10-CM | POA: Diagnosis not present

## 2018-05-30 DIAGNOSIS — J0100 Acute maxillary sinusitis, unspecified: Secondary | ICD-10-CM | POA: Diagnosis not present

## 2018-05-30 DIAGNOSIS — R05 Cough: Secondary | ICD-10-CM | POA: Diagnosis not present

## 2018-06-01 ENCOUNTER — Ambulatory Visit (HOSPITAL_COMMUNITY)
Admission: RE | Admit: 2018-06-01 | Discharge: 2018-06-01 | Disposition: A | Discharge status: Home / Self Care | Payer: BLUE CROSS/BLUE SHIELD | Source: Ambulatory Visit | Attending: Family Medicine | Admitting: Family Medicine

## 2018-06-01 ENCOUNTER — Other Ambulatory Visit: Payer: Self-pay

## 2018-06-01 ENCOUNTER — Encounter: Payer: Self-pay | Admitting: Family Medicine

## 2018-06-01 ENCOUNTER — Ambulatory Visit: Payer: BLUE CROSS/BLUE SHIELD | Admitting: Family Medicine

## 2018-06-01 VITALS — BP 124/68 | HR 78 | Temp 98.2°F | Resp 14 | Ht 62.5 in | Wt 176.0 lb

## 2018-06-01 DIAGNOSIS — J040 Acute laryngitis: Secondary | ICD-10-CM | POA: Diagnosis not present

## 2018-06-01 DIAGNOSIS — J0100 Acute maxillary sinusitis, unspecified: Secondary | ICD-10-CM

## 2018-06-01 DIAGNOSIS — Z1231 Encounter for screening mammogram for malignant neoplasm of breast: Secondary | ICD-10-CM

## 2018-06-01 MED ORDER — PREDNISONE 20 MG PO TABS: 20.0000 mg | ORAL_TABLET | Freq: Every day | ORAL | 0 refills | Status: DC

## 2018-06-01 MED ORDER — ONDANSETRON 4 MG PO TBDP: 4.0000 mg | ORAL_TABLET | Freq: Three times a day (TID) | ORAL | 0 refills | Status: DC | PRN

## 2018-06-01 MED ORDER — HYDROCOD POLST-CPM POLST ER 10-8 MG/5ML PO SUER: 5.0000 mL | Freq: Two times a day (BID) | ORAL | 0 refills | Status: DC | PRN

## 2018-06-01 NOTE — Progress Notes (Signed)
   Subjective:    Patient ID: Jacqueline [name], female    DOB: 1960-11-10, 57 y.o.   MRN: [id number]  Patient presents for Illness (x1 week- productive cough with brown colored sputum, hoarse, sinus pressure- was seen at [facility] and given tessalon and Augmentin)   Pt here with ongoing illness. Seen at [facility] on 12/21 , diagnosed with sinusitis, laryngitis, and cough. She had blood tinged nasal drainage. Gets coughing fits, and has post tussive emesis but had had emesis after eating as well. Decreased appetite She has been loss 5lbs in past 2 weeks No diarrhea.  + sick contacts  Given augmentin and tessalon. The tessalon does not work Flu and strep test negative at [place]:  GEN- denies fatigue, fever, weight loss,weakness, recent illness HEENT- denies eye drainage, change in vision, +nasal discharge, CVS- denies chest pain, palpitations RESP- denies SOB, +cough, wheeze ABD- denies N/V, change in stools, abd pain GU- denies dysuria, hematuria, dribbling, incontinence MSK-  denies headache, dizziness, syncope, seizure activity       Objective:    BP 124/68   Pulse 78   Temp 98.2 F (36.8 C) (Oral)   Resp 14   Ht 5' 2.5" (1.588 m)   Wt 176 lb (79.8 kg)   LMP 10/26/2016 (Exact Date)   SpO2 98%   BMI 31.68 kg/m  GEN- NAD, alert and oriented x3 HEENT- PERRL, EOMI, non injected sclera, pink conjunctiva, MMM, oropharynxclear , TM clear bilat no effusion,  + maxillary sinus tenderness, inflammed turbinates,  Nasal drainage ,hoarse voice  Neck- Supple, no LAD CVS- RRR, no murmur RESP-CTAB ABD-NABS,soft,NT,ND EXT- No edema Pulses- Radial 2+         Assessment & Plan:      Problem List Items Addressed This Visit    None    Visit Diagnoses    Acute maxillary sinusitis, recurrence not specified    -  Primary   complete antibiotics, given zofran for nausea, nasal spray OTC saline , tussionex for cough. Pred 20mg  x 7 days for inflammation, larygnitis   Relevant  Medications   amoxicillin-clavulanate (AUGMENTIN) 875-125 MG tablet   benzonatate (TESSALON) 100 MG capsule   chlorpheniramine-HYDROcodone (TUSSIONEX PENNKINETIC ER) 10-8 MG/5ML SUER   predniSONE (DELTASONE) 20 MG tablet   Acute laryngitis          Note: This dictation was prepared with Dragon dictation along with smaller phrase technology. Any transcriptional errors that result from this process are unintentional.

## 2018-06-01 NOTE — Patient Instructions (Addendum)
Take cough medicine Use nasal saline spray in the nose Steroids Take antibiotics as prescribed zofran for nausea F/U 3-6 months Physical

## 2018-06-30 ENCOUNTER — Other Ambulatory Visit: Payer: Self-pay

## 2018-06-30 ENCOUNTER — Ambulatory Visit: Payer: BLUE CROSS/BLUE SHIELD | Admitting: Family Medicine

## 2018-06-30 ENCOUNTER — Encounter: Payer: Self-pay | Admitting: Family Medicine

## 2018-06-30 VITALS — BP 124/78 | HR 80 | Temp 97.9°F | Resp 14 | Ht 62.5 in | Wt 177.0 lb

## 2018-06-30 DIAGNOSIS — B353 Tinea pedis: Secondary | ICD-10-CM | POA: Diagnosis not present

## 2018-06-30 DIAGNOSIS — I10 Essential (primary) hypertension: Secondary | ICD-10-CM | POA: Diagnosis not present

## 2018-06-30 DIAGNOSIS — R05 Cough: Secondary | ICD-10-CM | POA: Diagnosis not present

## 2018-06-30 DIAGNOSIS — R059 Cough, unspecified: Secondary | ICD-10-CM

## 2018-06-30 MED ORDER — FLUTICASONE PROPIONATE 50 MCG/ACT NA SUSP: 2.0000 | Freq: Every day | NASAL | 6 refills | Status: DC

## 2018-06-30 MED ORDER — AMLODIPINE BESYLATE 5 MG PO TABS: 5.0000 mg | ORAL_TABLET | Freq: Every day | ORAL | 1 refills | Status: DC

## 2018-06-30 MED ORDER — NYSTATIN 100000 UNIT/GM EX POWD: Freq: Four times a day (QID) | CUTANEOUS | 0 refills | Status: DC

## 2018-06-30 MED ORDER — CLOTRIMAZOLE 1 % EX CREA: 1.0000 "application " | TOPICAL_CREAM | Freq: Two times a day (BID) | CUTANEOUS | 1 refills | Status: DC

## 2018-06-30 NOTE — Assessment & Plan Note (Signed)
Trial off lisinopril due to cough No GERD symptoms Start norvasc 5mg  instead Recheck bp in 1 month

## 2018-06-30 NOTE — Progress Notes (Signed)
   Subjective:    Patient ID: Jacqueline [name], female    DOB: Oct 12, 1960, 58 y.o.   MRN: [id number]  Patient presents for Cough (x months- nonproductive cough that will not resolve)   Cough for a little over a month now. Treated for sinusitis on 12/23  Cough random through th eday, get coughing fits   no wheezing or SOB.mostly dry hacky cough and gets a tickle in her throat Using flonase not sure if it helps        Note Started lisinopril in November for elevated BP     Dryness and crack on left foot for months , using moisturizer and OTC fungal spray, itches at times   Review Of Systems:  GEN- denies fatigue, fever, weight loss,weakness, recent illness HEENT- denies eye drainage, change in vision, nasal discharge, CVS- denies chest pain, palpitations RESP- denies SOB, +cough, wheeze ABD- denies N/V, change in stools, abd pain GU- denies dysuria, hematuria, dribbling, incontinence MSK- denies joint pain, muscle aches, injury Neuro- denies headache, dizziness, syncope, seizure activity       Objective:    BP 124/78   Pulse 80   Temp 97.9 F (36.6 C) (Oral)   Resp 14   Ht 5' 2.5" (1.588 m)   Wt 177 lb (80.3 kg)   LMP 10/26/2016 (Exact Date)   SpO2 95%   BMI 31.86 kg/m  GEN- NAD, alert and oriented x3 HEENT- PERRL, EOMI, non injected sclera, pink conjunctiva, MMM, oropharynx clear, nares clear, TM clear bilat no effusion, no sinus tenderness  Neck- Supple, no thyromegaly, no LAD CVS- RRR, no murmur RESP-CTAB EXT- No edema, dry scaley feet lateral aspect between toes, no erythema,no blistesr, no odor  Pulses- Radial, DP- 2+        Assessment & Plan:      Problem List Items Addressed This Visit      Unprioritized   Hypertension    Trial off lisinopril due to cough No GERD symptoms Start norvasc 5mg  instead Recheck bp in 1 month      Relevant Medications   amLODipine (NORVASC) 5 MG tablet    Other Visit Diagnoses    Tinea pedis of both feet    -  Primary    clotrimazole and nystatin powder to be used, she had irritation from golds bonds   Relevant Medications   clotrimazole (LOTRIMIN) 1 % cream   nystatin (MYCOSTATIN/NYSTOP) powder   Cough       DD, ACEI, GERD, post nasal drip, on flonase, hold ACEI, normal exam and oxygen sat       Note: This dictation was prepared with Dragon dictation along with smaller phrase technology. Any transcriptional errors that result from this process are unintentional.

## 2018-06-30 NOTE — Patient Instructions (Signed)
Use cream for feet, use powder for moisture Stop lisinopril Start amlodipine F/U 1 month for blood pressure

## 2018-07-31 ENCOUNTER — Ambulatory Visit: Payer: BLUE CROSS/BLUE SHIELD | Admitting: Family Medicine

## 2018-08-12 ENCOUNTER — Encounter: Payer: Self-pay | Admitting: Family Medicine

## 2018-08-31 ENCOUNTER — Other Ambulatory Visit: Payer: Self-pay | Admitting: Family Medicine

## 2018-09-01 ENCOUNTER — Other Ambulatory Visit: Payer: Self-pay | Admitting: *Deleted

## 2018-09-01 NOTE — Telephone Encounter (Signed)
Received call from patient.   Reports that she has run out of her Amlodipine and is inquiring as to if she is supposed to continue medication.   Advised that patient was to stop Lisinopril D/T cough at OV in Jan 2020. Patient was to follow up to verify Amlodipine , new BP medication is effective. Patient did not follow up as scheduled.   Advised to continue the Amlodipine. Monitor BP 2-3x weekly. Call office with readings in 2 weeks.

## 2018-09-07 ENCOUNTER — Telehealth: Payer: Self-pay | Admitting: *Deleted

## 2018-09-07 NOTE — Telephone Encounter (Signed)
Patient denies travel, fever, SOB.

## 2018-09-07 NOTE — Telephone Encounter (Signed)
Received call from patient. [phone]~ telephone.  Reports that she has had x3 days of burning in throat at night, nausea an dry, hacky cough. States that cough is similar to when she was on Lisinopril.   Advised that cough can be caused by GERD. Advised to use OTC GERD medication. If no improvement noted in 1 week, call for OV.   Of note, patient is agreeable to televisit if appropriate.

## 2018-09-07 NOTE — Telephone Encounter (Signed)
As long as no fever, travel, SOB, Just burning in throat withcough, can try OTC omeprazole or zantac Not improved agree with teleophone visit

## 2018-10-07 ENCOUNTER — Other Ambulatory Visit: Payer: Self-pay | Admitting: Family Medicine

## 2018-10-15 ENCOUNTER — Telehealth: Payer: Self-pay | Admitting: *Deleted

## 2018-10-15 NOTE — Telephone Encounter (Signed)
Received call from patient [phone]~ telephone.   Reports that she is having increased arm pain on B sides.   Call placed to patient to inquire. LM on VM to contact office to schedule OV for evaluation.

## 2018-10-29 ENCOUNTER — Other Ambulatory Visit: Payer: Self-pay

## 2018-10-29 ENCOUNTER — Encounter: Payer: Self-pay | Admitting: Family Medicine

## 2018-10-29 ENCOUNTER — Ambulatory Visit: Payer: BLUE CROSS/BLUE SHIELD | Admitting: Family Medicine

## 2018-10-29 ENCOUNTER — Ambulatory Visit (HOSPITAL_COMMUNITY)
Admission: RE | Admit: 2018-10-29 | Discharge: 2018-10-29 | Disposition: A | Discharge status: Home / Self Care | Payer: BLUE CROSS/BLUE SHIELD | Source: Ambulatory Visit | Attending: Family Medicine | Admitting: Family Medicine

## 2018-10-29 VITALS — BP 110/78 | HR 92 | Temp 98.1°F | Resp 18 | Ht 62.5 in | Wt 186.0 lb

## 2018-10-29 DIAGNOSIS — M25512 Pain in left shoulder: Secondary | ICD-10-CM | POA: Insufficient documentation

## 2018-10-29 DIAGNOSIS — I10 Essential (primary) hypertension: Secondary | ICD-10-CM

## 2018-10-29 DIAGNOSIS — M25511 Pain in right shoulder: Secondary | ICD-10-CM

## 2018-10-29 MED ORDER — TRAMADOL HCL 50 MG PO TABS: 50.0000 mg | ORAL_TABLET | Freq: Three times a day (TID) | ORAL | 0 refills | Status: AC | PRN

## 2018-10-29 NOTE — Progress Notes (Signed)
Patient ID: Jacqueline [name], female    DOB: 12/19/1960, 58 y.o.   MRN: [id number]  PCP: Alycia Rossetti, MD  Chief Complaint  Patient presents with  . Shoulder Pain    both, x3 weeks, no meds    Subjective:   Jacqueline [name] is a 58 y.o. female, presents to clinic with CC of bilateral shoulder pain, gradually worsening for the past 3 weeks to month.  If her started bothering her in her right shoulder she believes she slept on it wrong and it felt like it was popping clicking or catching a little bit.  At first it was coming and going but now has become more constant.  Pain is achy and sore feeling on the lateral right shoulder does get worse with certain movements and does feel worse at night and is some nights keeping her up especially when she tries to reposition herself.  Her left arm right now is more painful if she lifts it up above the level of the shoulder and also has some popping and clicking.  It does feel like it got less range of motion than her right arm.  She did not injure either them and did not strain either of them.  She notes working for [employer] for many years but denies any specific repetitive or strenuous motion with her arms or shoulders.  She is concerned that she may have some arthritis in both of her shoulder since she has degenerative disc disease in her back and has had some other muscle skeletal issues recently.  She is on meloxicam for her back pain, also has gabapentin and is pending to have a epidural steroid injection in her lower spine by the spinal specialist.  Her pain is currently rated 7 out of 10, no other alleviating factors.  Denies any swelling and redness in her shoulders, denies any numbness tingling weakness in either arm, denies any upper neck pain stiffness or tightness in the upper back related to shoulders.  Patient also mentions briefly while she is leaving the exam room that she needed to have her blood pressure rechecked and she has no more  refills.  She states that her PCP changed her from a medicine that was making her cough to amlodipine and she has no more refills.  She states she no longer has cough she has known side effects of amlodipine.  Cannot remember the last time that she saw Dr. Buelah Manis.   Patient Active Problem List   Diagnosis Date Noted  . Hypertension 06/30/2018  . BMI 32.0-32.9,adult 12/10/2017  . S/P abdominal supracervical subtotal hysterectomy 11/06/2016  . Colon cancer screening   . Gastroesophageal reflux disease with esophagitis   . Anemia 08/04/2014  . GERD (gastroesophageal reflux disease) 08/04/2014  . Shoulder strain 07/26/2014  . Degenerative arthritis of thumb 07/26/2014  . Left knee pain 02/28/2014  . Hip pain 04/15/2013  . Epicondylitis, lateral (tennis elbow) 04/13/2013  . Constipation 09/15/2012  . Hyperlipidemia 11/28/2011  . Obesity, Class I, BMI 30.0-34.9 (see actual BMI) 09/15/2011  . ALLERGIC RHINITIS, SEASONAL 11/13/2007     Prior to Admission medications   Medication Sig Start Date End Date Taking? Authorizing Provider  amLODipine (NORVASC) 5 MG tablet Take 1 tablet by mouth once daily 10/07/18  Yes [place], Modena Nunnery, MD  clotrimazole (LOTRIMIN) 1 % cream Apply 1 application topically 2 (two) times daily. 06/30/18  Yes [place], Modena Nunnery, MD  estradiol (ESTRACE) 0.1 MG/GM vaginal cream Place 2  g vaginally as directed. Use 2 grams every other night 11/20/17  Yes Florian Buff, MD  fluticasone [facility]) 50 MCG/ACT nasal spray Place 2 sprays into both nostrils daily. 06/30/18  Yes [place], Modena Nunnery, MD  gabapentin (NEURONTIN) 300 MG capsule Take 300 mg by mouth 3 (three) times daily.   Yes [provider]  nystatin (MYCOSTATIN/NYSTOP) powder Apply topically 4 (four) times daily. 06/30/18  Yes , Modena Nunnery, MD     No Known Allergies   Family History  Problem Relation Age of Onset  . Cancer Maternal Grandmother         lymphnodes  . Hypertension Maternal Aunt   .  Cancer Maternal Aunt        Breast  . Colon cancer Neg Hx      Social History   Socioeconomic History  . Marital status: Single    Spouse name: Not on file  . Number of children: 3  . Years of education: Not on file  . Highest education level: Not on file  Occupational History  . Not on file  Social Needs  . Financial resource strain: Not on file  . Food insecurity:    Worry: Not on file    Inability: Not on file  . Transportation needs:    Medical: Not on file    Non-medical: Not on file  Tobacco Use  . Smoking status: Never Smoker  . Smokeless tobacco: Never Used  . Tobacco comment: Never smoked  Substance and Sexual Activity  . Alcohol use: No    Alcohol/week: 0.0 standard drinks  . Drug use: No  . Sexual activity: Yes    Birth control/protection: None, Post-menopausal  Lifestyle  . Physical activity:    Days per week: Not on file    Minutes per session: Not on file  . Stress: Not on file  Relationships  . Social connections:    Talks on phone: Not on file    Gets together: Not on file    Attends religious service: Not on file    Active member of club or organization: Not on file    Attends meetings of clubs or organizations: Not on file    Relationship status: Not on file  . Intimate partner violence:    Fear of current or ex partner: Not on file    Emotionally abused: Not on file    Physically abused: Not on file    Forced sexual activity: Not on file  Other Topics Concern  . Not on file  Social History Narrative  . Not on file     Review of Systems  Constitutional: Negative.   HENT: Negative.   Eyes: Negative.   Respiratory: Negative.   Cardiovascular: Negative.   Gastrointestinal: Negative.   Endocrine: Negative.   Genitourinary: Negative.   Musculoskeletal: Negative.   Skin: Negative.   Allergic/Immunologic: Negative.   Neurological: Negative.   Hematological: Negative.   Psychiatric/Behavioral: Negative.   All other systems reviewed  and are negative.      Objective:    Vitals:   10/29/18 1204  BP: 110/78  Pulse: 92  Resp: 18  Temp: 98.1 F (36.7 C)  SpO2: 97%  Weight: 186 lb (84.4 kg)  Height: 5' 2.5" (1.588 m)      Physical Exam Vitals signs and nursing note reviewed.  Constitutional:      Appearance: She is well-developed.  HENT:     Head: Normocephalic and atraumatic.     Nose: Nose normal.  Eyes:     General:        Right eye: No discharge.        Left eye: No discharge.     Conjunctiva/sclera: Conjunctivae normal.  Neck:     Trachea: No tracheal deviation.  Cardiovascular:     Rate and Rhythm: Normal rate and regular rhythm.  Pulmonary:     Effort: Pulmonary effort is normal. No respiratory distress.     Breath sounds: No stridor.  Musculoskeletal:     Right shoulder: She exhibits normal range of motion, no tenderness, no bony tenderness, no swelling, no effusion, no crepitus, no pain, no spasm, normal pulse and normal strength.     Left shoulder: She exhibits tenderness. She exhibits normal range of motion, no bony tenderness, no swelling, no effusion, no crepitus, no deformity, no spasm, normal pulse and normal strength.     Comments: B/l shoulders negative empty can test and drop test Left shoulder + apleys scratch test Right shoulder normal apleys scratch test  Left shoulder ttp to glenohumeral fossa, no biceps groove tenderness to palpation, no other bony tenderness to [facility] joint, [place] joint or clavicle  Right shoulder no tenderness  Patient stated during examination that she felt some popping and clicking but I did not palpate any crepitus with active and passive range of motion testing  Skin:    General: Skin is warm and dry.     Findings: No rash.  Neurological:     Mental Status: She is alert.     Motor: No abnormal muscle tone.     Coordination: Coordination normal.  Psychiatric:        Behavior: Behavior normal.           Assessment & Plan:   Problem List Items  Addressed This Visit      Cardiovascular and Mediastinum   Hypertension    Patient reports compliance with amlodipine but does need refills, no side effects of medication and cough has resolved, blood pressure at goal today well controlled. Will put in refills for her and she is to follow-up with her PCP at her upcoming appointment       Other Visit Diagnoses    Acute pain of left shoulder    -  Primary   Relevant Medications   traMADol (ULTRAM) 50 MG tablet   Other Relevant Orders   DG Shoulder Left   Ambulatory referral to Physical Therapy   Acute pain of right shoulder       Relevant Medications   traMADol (ULTRAM) 50 MG tablet   Other Relevant Orders   DG Shoulder Right   Ambulatory referral to Physical Therapy         Patient with insidious onset over the last month of bilateral shoulder pain without injury.  She would like to be evaluated for arthritis in her shoulders.  She has some tenderness in the left shoulder to the glenohumeral joint, has no overt or concerning signs of rotator cuff tears or impingement syndrome, possibly some labral involvement? Will obtain x-rays of both shoulders to evaluate the joint space look for any arthritic changes.  Patient is already on Mobic for degenerative joint disease in her back.  Do feel that physical therapy would be the most helpful treatment modality I do not know if she can start this soon with COVID restrictions are health systems, and I discussed the process with her and hopefully she will get to start PT as soon as possible.  Scribed a  small amount of tramadol for her to take very sparingly for nights when the pain is so severe that she cannot sleep otherwise she should continue her Mobic intake Tylenol as needed for pain.  She will likely need orthopedic evaluation but will get the process started and patient was encouraged to follow-up in a few weeks if she is unable to get into physical therapy, if her pain is unchanged or  worsening, otherwise she can follow-up with her PCP after doing about 6 weeks of physical therapy to reassess  Delsa Grana, PA-C 10/29/18 12:26 PM

## 2018-10-29 NOTE — Addendum Note (Signed)
Addended by: Delsa Grana on: 10/29/2018 09:05 PM   Modules accepted: Orders

## 2018-10-29 NOTE — Assessment & Plan Note (Signed)
Patient reports compliance with amlodipine but does need refills, no side effects of medication and cough has resolved, blood pressure at goal today well controlled. Will put in refills for her and she is to follow-up with her PCP at her upcoming appointment

## 2018-11-04 ENCOUNTER — Other Ambulatory Visit: Payer: Self-pay

## 2018-11-04 DIAGNOSIS — M25519 Pain in unspecified shoulder: Secondary | ICD-10-CM

## 2018-11-04 MED ORDER — AMLODIPINE BESYLATE 5 MG PO TABS: 5.0000 mg | ORAL_TABLET | Freq: Every day | ORAL | 2 refills | Status: DC

## 2018-11-06 ENCOUNTER — Encounter (HOSPITAL_COMMUNITY): Payer: Self-pay | Admitting: Occupational Therapy

## 2018-11-06 ENCOUNTER — Other Ambulatory Visit: Payer: Self-pay

## 2018-11-06 ENCOUNTER — Ambulatory Visit (HOSPITAL_COMMUNITY): Payer: BC Managed Care – PPO | Attending: Family Medicine | Admitting: Occupational Therapy

## 2018-11-06 DIAGNOSIS — M25512 Pain in left shoulder: Secondary | ICD-10-CM | POA: Diagnosis present

## 2018-11-06 DIAGNOSIS — M25511 Pain in right shoulder: Secondary | ICD-10-CM | POA: Insufficient documentation

## 2018-11-06 DIAGNOSIS — R29898 Other symptoms and signs involving the musculoskeletal system: Secondary | ICD-10-CM | POA: Insufficient documentation

## 2018-11-06 NOTE — Patient Instructions (Signed)
1) SHOULDER: Flexion On Table   Place hands on towel placed on table, elbows straight. Lean forward with you upper body, pushing towel away from body._15__ reps per set, _2-3__ sets per day  2) Abduction (Passive)   With arm out to side, resting on towel placed on table, keeping trunk away from table, lean to the side while pushing towel away from body.  Repeat _15___ times. Do __2-3__ sessions per day.  Copyright  VHI. All rights reserved.     3) Internal Rotation (Assistive)   Seated with elbow bent at right angle and held against side, slide arm on table surface in an inward arc keeping elbow anchored in place. Repeat __15__ times. Do __2-3__ sessions per day. Activity: Use this motion to brush crumbs off the table.  Copyright  VHI. All rights reserved.    3) Seated Row   Sit up straight with elbows by your sides. Pull back with shoulders/elbows, keeping forearms straight, as if pulling back on the reins of a horse. Squeeze shoulder blades together. Repeat _15__times, _2-3___sets/day    4) Shoulder Elevation    Sit up straight with arms by your sides. Slowly bring your shoulders up towards your ears. Repeat_15__times, _2-3___ sets/day    5) Shoulder Extension    Sit up straight with both arms by your side, draw your arms back behind your waist. Keep your elbows straight. Repeat __15__times, __2-3__sets/day.

## 2018-11-06 NOTE — Therapy (Signed)
[place], [place], [address] Phone: [phone]   Fax:  [phone]  Occupational Therapy Evaluation  Patient Details  Name: Jacqueline [name] MRN: [id number] Date of Birth: 02/20/1961 No data recorded  Encounter Date: 11/06/2018  OT End of Session - 11/06/18 1623    Visit Number  1    Number of Visits  16    Date for OT Re-Evaluation  01/05/19    Authorization Type  [employer]     Authorization Time Period  60 visit limit    Authorization - Visit Number  1    Authorization - Number of Visits  47    OT Start Time  8099    OT Stop Time  1610    OT Time Calculation (min)  39 min    Activity Tolerance  Patient tolerated treatment well    Behavior During Therapy  [facility] for tasks assessed/performed       Past Medical History:  Diagnosis Date  . Anemia   . Fibroid uterus   . GERD (gastroesophageal reflux disease)   . Hyperlipidemia    Borderline  . Pre-diabetes    borderline    Past Surgical History:  Procedure Laterality Date  . CHOLECYSTECTOMY    . COLONOSCOPY  2003   Dr. Gala Romney: Anal canal hemorrhoids  . COLONOSCOPY N/A 08/25/2014   Procedure: COLONOSCOPY;  Surgeon: Daneil Dolin, MD;  Location: [facility];  Service: Endoscopy;  Laterality: N/A;  1200  . ESOPHAGOGASTRODUODENOSCOPY N/A 08/25/2014   Procedure: ESOPHAGOGASTRODUODENOSCOPY (EGD);  Surgeon: Daneil Dolin, MD;  Location: [facility];  Service: Endoscopy;  Laterality: N/A;  . SALPINGOOPHORECTOMY Bilateral 11/06/2016   Procedure: BILATERAL SALPINGO OOPHORECTOMY;  Surgeon: Florian Buff, MD;  Location: [facility] OR[facility];  Service: Gynecology;  Laterality: Bilateral;  . SUPRACERVICAL ABDOMINAL HYSTERECTOMY N/A 11/06/2016   Procedure: HYSTERECTOMY SUPRACERVICAL ABDOMINAL;  Surgeon: Florian Buff, MD;  Location: [facility];  Service: Gynecology;  Laterality: N/A;  . TUBAL LIGATION      There were no vitals filed for this visit.  Subjective Assessment - 11/06/18  1619    Subjective   S: The right one started hurting first then the left one started.     Pertinent History  Pt is a 58 y/o female presenting with bilateral shoulder pain beginning approximately one month ago without known injury or cause. Pt had x-rays which were negative for abnormality. Pt was referred to occupational therarpy for evaluation and treatment by Delsa Grana, PA-C.     Special Tests  FOTO: 53/100    Patient Stated Goals  To be able to use my arms more with less pain.     Currently in Pain?  Yes    Pain Score  7     Pain Location  Shoulder    Pain Orientation  Right;Left    Pain Descriptors / Indicators  Aching;Sore    Pain Type  Acute pain    Pain Radiating Towards  n/a    Pain Onset  1 to 4 weeks ago    Pain Frequency  Constant    Aggravating Factors   movement    Pain Relieving Factors  rest    Effect of Pain on Daily Activities  mod/max effect on ADLs    Multiple Pain Sites  No        [facility] OT Assessment - 11/06/18 1532      Assessment   Medical Diagnosis  bilateral shoulder pain  Onset Date/Surgical Date  10/07/18    Hand Dominance  Right    Prior Therapy  None      Precautions   Precautions  None    Precaution Comments  Pt has significant DDD in back causing pain/limited mobility      Restrictions   Weight Bearing Restrictions  No      Balance Screen   Has the patient fallen in the past 6 months  No    Has the patient had a decrease in activity level because of a fear of falling?   No    Is the patient reluctant to leave their home because of a fear of falling?   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     Leisure  yardwork, washing cars      ADL   ADL comments  Pt is having difficulty with dressing, reaching up and fixing hair, turning heady/body when driving, toilet hygiene, reaching  behind when dressing/washing back, sleeping      Written Expression   Dominant Hand  Right      Cognition   Overall Cognitive Status  Within Functional Limits for tasks assessed      Observation/Other Assessments   Focus on Therapeutic Outcomes (FOTO)   53/100      ROM / Strength   AROM / PROM / Strength  AROM;PROM;Strength      Palpation   Palpation comment  mod fascial restrictions in bilateral biceps, deltoids, trapezius, and scapularis regions      AROM   Overall AROM Comments  Assessed seated, er/IR adducted    AROM Assessment Site  Shoulder    Right/Left Shoulder  Right;Left    Right Shoulder Flexion  87 Degrees    Right Shoulder ABduction  85 Degrees    Right Shoulder Internal Rotation  90 Degrees    Right Shoulder External Rotation  61 Degrees    Left Shoulder Flexion  111 Degrees    Left Shoulder ABduction  83 Degrees    Left Shoulder Internal Rotation  90 Degrees    Left Shoulder External Rotation  63 Degrees      PROM   Overall PROM Comments  Assessed supine, er/IR adducted    PROM Assessment Site  Shoulder    Right/Left Shoulder  Right;Left    Right Shoulder Flexion  126 Degrees    Right Shoulder ABduction  100 Degrees    Right Shoulder Internal Rotation  90 Degrees    Right Shoulder External Rotation  61 Degrees    Left Shoulder Flexion  118 Degrees    Left Shoulder ABduction  90 Degrees    Left Shoulder Internal Rotation  90 Degrees    Left Shoulder External Rotation  63 Degrees      Strength   Overall Strength Comments  Assessed seated, er/IR adducted    Strength Assessment Site  Shoulder    Right/Left Shoulder  Right;Left    Right Shoulder Flexion  4-/5    Right Shoulder ABduction  3-/5    Right Shoulder Internal Rotation  4+/5    Right Shoulder External Rotation  4-/5    Left Shoulder Flexion  4-/5    Left Shoulder ABduction  3-/5    Left Shoulder Internal Rotation  4+/5    Left Shoulder External Rotation  4-/5                       OT Education - 11/06/18 1618    Education Details  table slides, scapular A/ROM (if table slides hurt back do not complete)    Person(s) Educated  Patient    Methods  Explanation;Demonstration;Handout    Comprehension  Verbalized understanding;Returned demonstration       OT Short Term Goals - 11/06/18 1626      OT SHORT TERM GOAL #1   Title  Pt will be provided with HEP to improve mobility required for ADL completion using BUE.     Time  4    Period  Weeks    Status  New    Target Date  12/06/18      OT SHORT TERM GOAL #2   Title  Pt will decrease pain in BUE to 5/10 to improve ability to perform dressing tasks with minimal compensatory movements.     Time  4    Period  Weeks    Status  New      OT SHORT TERM GOAL #3   Title  Pt will increase BUE P/ROM to [facility] to improve ability to complete low level reach tasks required for dressing and bathing.     Time  4    Period  Weeks    Status  New      OT SHORT TERM GOAL #4   Title  Pt will increase strength in BUE to 4-/5 to improve ability to complete reaching and lifting tasks below shoulder level.     Time  4    Period  Weeks    Status  New        OT Long Term Goals - 11/06/18 1629      OT LONG TERM GOAL #1   Title  Pt will return to highest level of functioning using BUE as dominant and non-dominant during ADL, work, and leisure tasks without need for compensatory strategies.     Time  8    Period  Weeks    Status  New    Target Date  01/05/19      OT LONG TERM GOAL #2   Title  Pt will decrease pain in BUE to 3/10 or less to improve ability to sleep in preferred position.     Time  8    Period  Weeks    Status  New      OT LONG TERM GOAL #3   Title  Pt will increase BUE A/ROM to [facility] to improve ability to reach up to fix hair and behind back for bathing/dressing tasks.     Time  8    Period  Weeks    Status  New      OT LONG TERM GOAL #4   Title  Pt will decrease fascial  restrictions in BUE to minimal amounts or less to improve mobility required for functional reaching tasks.     Time  8    Period  Weeks    Status  New      OT LONG TERM GOAL #5   Title  Pt will improve strength in BUE to 4+/5 or greater to improve ability to perform work tasks in the allotted time.     Time  8    Period  Weeks    Status  New            Plan -  11/06/18 1623    Clinical Impression Statement  A: Pt is a 58 y/o female presenting with bilateral shoulder pain of unknown cause. Right shoulder began hurting first, then left began hurting approximately 1-2 weeks later. Pain is limiting mobility and functioning during ADL tasks.     OT Occupational Profile and History  Problem Focused Assessment - Including review of records relating to presenting problem    Occupational performance deficits (Please refer to evaluation for details):  ADL's;IADL's;Rest and Sleep;Work;Leisure    Body Structure / Function / Physical Skills  ADL;Strength;Pain;Edema;UE functional use;IADL;ROM;Fascial restriction;Mobility    Rehab Potential  Good    Clinical Decision Making  Limited treatment options, no task modification necessary    Comorbidities Affecting Occupational Performance:  May have comorbidities impacting occupational performance    Modification or Assistance to Complete Evaluation   Min-Moderate modification of tasks or assist with assess necessary to complete eval    OT Frequency  2x / week    OT Duration  8 weeks    OT Treatment/Interventions  Self-care/ADL training;Moist Heat;Therapeutic activities;Ultrasound;Therapeutic exercise;Cryotherapy;Passive range of motion;Electrical Stimulation;Manual Therapy;Patient/family education    Plan  P: Pt will benefit from skilled OT services to decrease pain and fascial restrictions, increase ROM, strength, and functional use of BUE during ADL tasks. Treatment plan: myofascial release, manual therapy, P/ROM, AA/ROM, A/ROM, scapular mobility and  strengthening, general BUE strengthening, modalities prn    Consulted and Agree with Plan of Care  Patient       Patient will benefit from skilled therapeutic intervention in order to improve the following deficits and impairments:  Body Structure / Function / Physical Skills  Visit Diagnosis: Acute pain of left shoulder  Acute pain of right shoulder  Other symptoms and signs involving the musculoskeletal system    Problem List Patient Active Problem List   Diagnosis Date Noted  . Hypertension 06/30/2018  . BMI 32.0-32.9,adult 12/10/2017  . S/P abdominal supracervical subtotal hysterectomy 11/06/2016  . Colon cancer screening   . Gastroesophageal reflux disease with esophagitis   . Anemia 08/04/2014  . GERD (gastroesophageal reflux disease) 08/04/2014  . Shoulder strain 07/26/2014  . Degenerative arthritis of thumb 07/26/2014  . Left knee pain 02/28/2014  . Hip pain 04/15/2013  . Epicondylitis, lateral (tennis elbow) 04/13/2013  . Constipation 09/15/2012  . Hyperlipidemia 11/28/2011  . Obesity, Class I, BMI 30.0-34.9 (see actual BMI) 09/15/2011  . ALLERGIC RHINITIS, SEASONAL 11/13/2007   Guadelupe Sabin, OTR/L  [phone] 11/06/2018, 4:33 PM  [place] [address] [place], [place], [address] Phone: [phone]   Fax:  [phone]  Name: Jacqueline [name] MRN: [id number] Date of Birth: [birth year]

## 2018-11-10 ENCOUNTER — Ambulatory Visit (HOSPITAL_COMMUNITY): Payer: BC Managed Care – PPO | Attending: Family Medicine | Admitting: Specialist

## 2018-11-10 ENCOUNTER — Other Ambulatory Visit: Payer: Self-pay

## 2018-11-10 ENCOUNTER — Encounter (HOSPITAL_COMMUNITY): Payer: Self-pay | Admitting: Specialist

## 2018-11-10 DIAGNOSIS — M25511 Pain in right shoulder: Secondary | ICD-10-CM | POA: Insufficient documentation

## 2018-11-10 DIAGNOSIS — M25512 Pain in left shoulder: Secondary | ICD-10-CM | POA: Insufficient documentation

## 2018-11-10 DIAGNOSIS — R29898 Other symptoms and signs involving the musculoskeletal system: Secondary | ICD-10-CM | POA: Diagnosis present

## 2018-11-10 NOTE — Therapy (Signed)
[place], [place], [address] Phone: [phone]   Fax:  [phone]  Occupational Therapy Treatment  Patient Details  Name: KALYSSA ANKER MRN: [id number] Date of Birth: [birth year] No data recorded  Encounter Date: 11/10/2018  OT End of Session - 11/10/18 1508    Visit Number  2    Number of Visits  16    Date for OT Re-Evaluation  01/05/19    Authorization Type  BC[employer]     Authorization Time Period  60 visit limit    Authorization - Visit Number  2    Authorization - Number of Visits  47    OT Start Time  1446    OT Stop Time  1524    OT Time Calculation (min)  38 min    Activity Tolerance  Patient tolerated treatment well    Behavior During Therapy  [facility] for tasks assessed/performed       Past Medical History:  Diagnosis Date  . Anemia   . Fibroid uterus   . GERD (gastroesophageal reflux disease)   . Hyperlipidemia    Borderline  . Pre-diabetes    borderline    Past Surgical History:  Procedure Laterality Date  . CHOLECYSTECTOMY    . COLONOSCOPY  2003   Dr. Gala Romney: Anal canal hemorrhoids  . COLONOSCOPY N/A 08/25/2014   Procedure: COLONOSCOPY;  Surgeon: Daneil Dolin, MD;  [facility];  Service: Endoscopy;  Laterality: N/A;  1200  . ESOPHAGOGASTRODUODENOSCOPY N/A 08/25/2014   Procedure: ESOPHAGOGASTRODUODENOSCOPY (EGD);  Surgeon: Daneil Dolin, MD;  Location[facility];  Service: Endoscopy;  Laterality: N/A;  . SALPINGOOPHORECTOMY Bilateral 11/06/2016   Procedure: BILATERAL SALPINGO OOPHORECTOMY;  Surgeon: Florian Buff, MD;  Location: [facility] OR[facility];  Service: Gynecology;  Laterality: Bilateral;  . SUPRACERVICAL ABDOMINAL HYSTERECTOMY N/A 11/06/2016   Procedure: HYSTERECTOMY SUPRACERVICAL ABDOMINAL;  Surgeon: Florian Buff, MD;  Location: [facility] ORS;  Service: Gynecology;  Laterality: N/A;  . TUBAL LIGATION      There were no vitals filed for this visit.  Subjective Assessment - 11/10/18 1507     Subjective   S:  The right one pops more than the left.     Currently in Pain?  Yes    Pain Score  6     Pain Location  Shoulder    Pain Orientation  Right;Left    Pain Descriptors / Indicators  Aching;Sore    Pain Type  Acute pain    Pain Radiating Towards  upper arms, popping sensation         [facility] OT Assessment - 11/10/18 0001      Assessment   Medical Diagnosis  bilateral shoulder pain    Onset Date/Surgical Date  10/07/18    Hand Dominance  Right      Precautions   Precautions  None               OT Treatments/Exercises (OP) - 11/10/18 0001      Exercises   Exercises  Shoulder      Shoulder Exercises: Supine   Protraction  PROM;AAROM;10 reps    Horizontal ABduction  PROM;AAROM;10 reps    External Rotation  PROM;AAROM;10 reps    Internal Rotation  PROM;AAROM;10 reps    Flexion  PROM;AAROM;10 reps    ABduction  PROM;AAROM;10 reps      Shoulder Exercises: Seated   Elevation  AROM;10 reps    Extension  AROM;10 reps  Row  AROM;10 reps      Shoulder Exercises: Therapy Ball   Flexion  Both;10 reps    ABduction  Both;10 reps      Manual Therapy   Manual Therapy  Myofascial release    Manual therapy comments  completed seperately from all other interventions     Myofascial Release  myofascial release and manual stretching to right and left shoulder, upper arm, and scapular region to decrease fascial restrictions and improved pain free mobility in bilateral shoulder regions.                OT Short Term Goals - 11/10/18 1555      OT SHORT TERM GOAL #1   Title  Pt will be provided with HEP to improve mobility required for ADL completion using BUE.     Time  4    Period  Weeks    Status  On-going    Target Date  12/06/18      OT SHORT TERM GOAL #2   Title  Pt will decrease pain in BUE to 5/10 to improve ability to perform dressing tasks with minimal compensatory movements.     Time  4    Period  Weeks    Status  On-going      OT SHORT  TERM GOAL #3   Title  Pt will increase BUE P/ROM to [facility] to improve ability to complete low level reach tasks required for dressing and bathing.     Time  4    Period  Weeks    Status  On-going      OT SHORT TERM GOAL #4   Title  Pt will increase strength in BUE to 4-/5 to improve ability to complete reaching and lifting tasks below shoulder level.     Time  4    Period  Weeks    Status  On-going        OT Long Term Goals - 11/10/18 1555      OT LONG TERM GOAL #1   Title  Pt will return to highest level of functioning using BUE as dominant and non-dominant during ADL, work, and leisure tasks without need for compensatory strategies.     Time  8    Period  Weeks    Status  On-going      OT LONG TERM GOAL #2   Title  Pt will decrease pain in BUE to 3/10 or less to improve ability to sleep in preferred position.     Time  8    Period  Weeks    Status  On-going      OT LONG TERM GOAL #3   Title  Pt will increase BUE A/ROM to [facility] to improve ability to reach up to fix hair and behind back for bathing/dressing tasks.     Time  8    Period  Weeks    Status  On-going      OT LONG TERM GOAL #4   Title  Pt will decrease fascial restrictions in BUE to minimal amounts or less to improve mobility required for functional reaching tasks.     Time  8    Period  Weeks    Status  On-going      OT LONG TERM GOAL #5   Title  Pt will improve strength in BUE to 4+/5 or greater to improve ability to perform work tasks in the allotted time.     Time  8  Period  Weeks    Status  On-going            Plan - 11/10/18 1552    Clinical Impression Statement  A:  Patient tolerated p/rom and aa/rom wtih moderate pain, patient also experiencing increased back pain, and is receiving injection on Thursday, hoping this will alleviate the back pain.  Patient has greater available range in her left shoulder compared to right shoulder.      Body Structure / Function / Physical Skills   ADL;Strength;Pain;Edema;UE functional use;IADL;ROM;Fascial restriction;Mobility    Plan  P:  Continue manual therapy to decrease tightness in bilateral shoulders.  add seated aa/rom for greater improved functional mobility needed for adl completion.       Patient will benefit from skilled therapeutic intervention in order to improve the following deficits and impairments:     Visit Diagnosis: Acute pain of left shoulder  Acute pain of right shoulder  Other symptoms and signs involving the musculoskeletal system    Problem List Patient Active Problem List   Diagnosis Date Noted  . Hypertension 06/30/2018  . BMI 32.0-32.9,adult 12/10/2017  . S/P abdominal supracervical subtotal hysterectomy 11/06/2016  . Colon cancer screening   . Gastroesophageal reflux disease with esophagitis   . Anemia 08/04/2014  . GERD (gastroesophageal reflux disease) 08/04/2014  . Shoulder strain 07/26/2014  . Degenerative arthritis of thumb 07/26/2014  . Left knee pain 02/28/2014  . Hip pain 04/15/2013  . Epicondylitis, lateral (tennis elbow) 04/13/2013  . Constipation 09/15/2012  . Hyperlipidemia 11/28/2011  . Obesity, Class I, BMI 30.0-34.9 (see actual BMI) 09/15/2011  . ALLERGIC RHINITIS, SEASONAL 11/13/2007    Vangie Bicker, [place], OTR/L [phone] 11/10/2018, 4:01 PM  [place], [place], [address] Phone: [phone]   Fax:  [phone]  Name: LACRISHA BIELICKI MRN: [id number] Date of Birth: Oct 16, 1960

## 2018-11-18 ENCOUNTER — Other Ambulatory Visit: Payer: Self-pay

## 2018-11-18 ENCOUNTER — Encounter (HOSPITAL_COMMUNITY): Payer: Self-pay | Admitting: Occupational Therapy

## 2018-11-18 ENCOUNTER — Ambulatory Visit (HOSPITAL_COMMUNITY): Payer: BC Managed Care – PPO | Admitting: Occupational Therapy

## 2018-11-18 DIAGNOSIS — R29898 Other symptoms and signs involving the musculoskeletal system: Secondary | ICD-10-CM

## 2018-11-18 DIAGNOSIS — M25512 Pain in left shoulder: Secondary | ICD-10-CM

## 2018-11-18 DIAGNOSIS — M25511 Pain in right shoulder: Secondary | ICD-10-CM

## 2018-11-18 NOTE — Therapy (Signed)
[place], [place], [address] Phone: [phone]   Fax:  [phone]  Occupational Therapy Treatment  Patient Details  Name: Jacqueline [name] MRN: [id number] Date of Birth: 1961/06/02 No data recorded  Encounter Date: 11/18/2018  OT End of Session - 11/18/18 1557    Visit Number  3    Number of Visits  16    Date for OT Re-Evaluation  01/05/19    Authorization Type  BCBS     Authorization Time Period  60 visit limit    Authorization - Visit Number  3    Authorization - Number of Visits  28    OT Start Time  1515    OT Stop Time  1555    OT Time Calculation (min)  40 min    Activity Tolerance  Patient tolerated treatment well    Behavior During Therapy  [facility] for tasks assessed/performed       Past Medical History:  Diagnosis Date  . Anemia   . Fibroid uterus   . GERD (gastroesophageal reflux disease)   . Hyperlipidemia    Borderline  . Pre-diabetes    borderline    Past Surgical History:  Procedure Laterality Date  . CHOLECYSTECTOMY    . COLONOSCOPY  2003   Dr. Gala Romney: Anal canal hemorrhoids  . COLONOSCOPY N/A 08/25/2014   Procedure: COLONOSCOPY;  Surgeon: Daneil Dolin, MD;  [facility];  Service: Endoscopy;  Laterality: N/A;  1200  . ESOPHAGOGASTRODUODENOSCOPY N/A 08/25/2014   Procedure: ESOPHAGOGASTRODUODENOSCOPY (EGD);  Surgeon: Daneil Dolin, MD;  [facility];  Service: Endoscopy;  Laterality: N/A;  . SALPINGOOPHORECTOMY Bilateral 11/06/2016   Procedure: BILATERAL SALPINGO OOPHORECTOMY;  Surgeon: Florian Buff, MD;  Location: [facility];  Service: Gynecology;  Laterality: Bilateral;  . SUPRACERVICAL ABDOMINAL HYSTERECTOMY N/A 11/06/2016   Procedure: HYSTERECTOMY SUPRACERVICAL ABDOMINAL;  Surgeon: Florian Buff, MD;  Location: [facility];  Service: Gynecology;  Laterality: N/A;  . TUBAL LIGATION      There were no vitals filed for this visit.  Subjective Assessment - 11/18/18 1457     Subjective   S: They're a whole lot better than they were.     Currently in Pain?  No/denies         [facility] OT Assessment - 11/18/18 1457      Assessment   Medical Diagnosis  bilateral shoulder pain      Precautions   Precautions  None               OT Treatments/Exercises (OP) - 11/18/18 1458      Exercises   Exercises  Shoulder      Shoulder Exercises: Supine   Protraction  PROM;5 reps;AROM;10 reps    Horizontal ABduction  PROM;5 reps;AROM;10 reps    External Rotation  PROM;5 reps;AROM;10 reps    Internal Rotation  PROM;5 reps;AROM;10 reps    Flexion  PROM;5 reps;AROM;10 reps    ABduction  PROM;5 reps;AROM;10 reps      Shoulder Exercises: Standing   Protraction  AAROM;10 reps    Horizontal ABduction  AAROM;10 reps    External Rotation  AAROM;10 reps    Internal Rotation  AAROM;10 reps    Flexion  AAROM;10 reps    ABduction  AAROM;10 reps    Extension  Theraband;10 reps    Theraband Level (Shoulder Extension)  Level 2 (Red)    Row  Theraband;10 reps    Theraband Level (  Shoulder Row)  Level 2 (Red)    Retraction  Theraband;10 reps    Theraband Level (Shoulder Retraction)  Level 2 (Red)      Shoulder Exercises: ROM/Strengthening   UBE (Upper Arm Bike)  Level 1 3' reverse    Over Head Lace  2'- unlacing with left, lacing with right    Proximal Shoulder Strengthening, Supine  10X each no rest breaks    Other ROM/Strengthening Exercises  ball pass behind back using tennis ball working on IR      Manual Therapy   Manual Therapy  Myofascial release    Manual therapy comments  completed seperately from all other interventions     Myofascial Release  myofascial release and manual stretching to right and left shoulder, upper arm, and scapular region to decrease fascial restrictions and improved pain free mobility in bilateral shoulder regions.                OT Short Term Goals - 11/10/18 1555      OT SHORT TERM GOAL #1   Title  Pt will be provided  with HEP to improve mobility required for ADL completion using BUE.     Time  4    Period  Weeks    Status  On-going    Target Date  12/06/18      OT SHORT TERM GOAL #2   Title  Pt will decrease pain in BUE to 5/10 to improve ability to perform dressing tasks with minimal compensatory movements.     Time  4    Period  Weeks    Status  On-going      OT SHORT TERM GOAL #3   Title  Pt will increase BUE P/ROM to [facility] to improve ability to complete low level reach tasks required for dressing and bathing.     Time  4    Period  Weeks    Status  On-going      OT SHORT TERM GOAL #4   Title  Pt will increase strength in BUE to 4-/5 to improve ability to complete reaching and lifting tasks below shoulder level.     Time  4    Period  Weeks    Status  On-going        OT Long Term Goals - 11/10/18 1555      OT LONG TERM GOAL #1   Title  Pt will return to highest level of functioning using BUE as dominant and non-dominant during ADL, work, and leisure tasks without need for compensatory strategies.     Time  8    Period  Weeks    Status  On-going      OT LONG TERM GOAL #2   Title  Pt will decrease pain in BUE to 3/10 or less to improve ability to sleep in preferred position.     Time  8    Period  Weeks    Status  On-going      OT LONG TERM GOAL #3   Title  Pt will increase BUE A/ROM to [facility] to improve ability to reach up to fix hair and behind back for bathing/dressing tasks.     Time  8    Period  Weeks    Status  On-going      OT LONG TERM GOAL #4   Title  Pt will decrease fascial restrictions in BUE to minimal amounts or less to improve mobility required for functional reaching tasks.  Time  8    Period  Weeks    Status  On-going      OT LONG TERM GOAL #5   Title  Pt will improve strength in BUE to 4+/5 or greater to improve ability to perform work tasks in the allotted time.     Time  8    Period  Weeks    Status  On-going            Plan - 11/18/18 1536     Clinical Impression Statement  A: Pt reports she is back to work and using her arms more now. Pt has ROM WFL both passive and active today, very close to WNL. Progressed to A/ROM in supine, added AA/ROM in standing to achieve full ROM. Added red scapular theraband, overhead lacing, and UBE. Verbal cuing for form and technique.     Body Structure / Function / Physical Skills  ADL;Strength;Pain;Edema;UE functional use;IADL;ROM;Fascial restriction;Mobility    Plan  P: Continue with manual techniques, progress to all A/ROM, add x to v arms       Patient will benefit from skilled therapeutic intervention in order to improve the following deficits and impairments:  Body Structure / Function / Physical Skills  Visit Diagnosis: Acute pain of left shoulder  Acute pain of right shoulder  Other symptoms and signs involving the musculoskeletal system    Problem List Patient Active Problem List   Diagnosis Date Noted  . Hypertension 06/30/2018  . BMI 32.0-32.9,adult 12/10/2017  . S/P abdominal supracervical subtotal hysterectomy 11/06/2016  . Colon cancer screening   . Gastroesophageal reflux disease with esophagitis   . Anemia 08/04/2014  . GERD (gastroesophageal reflux disease) 08/04/2014  . Shoulder strain 07/26/2014  . Degenerative arthritis of thumb 07/26/2014  . Left knee pain 02/28/2014  . Hip pain 04/15/2013  . Epicondylitis, lateral (tennis elbow) 04/13/2013  . Constipation 09/15/2012  . Hyperlipidemia 11/28/2011  . Obesity, Class I, BMI 30.0-34.9 (see actual BMI) 09/15/2011  . ALLERGIC RHINITIS, SEASONAL 11/13/2007   Guadelupe Sabin, OTR/L  [phone] 11/18/2018, 3:58 PM  [place] [address] [place], [place], [address] Phone: [phone]   Fax:  [phone]  Name: BRIANDA BEITLER MRN: [id number] Date of Birth: 08-02-1960

## 2018-11-20 ENCOUNTER — Encounter

## 2018-11-24 ENCOUNTER — Ambulatory Visit (HOSPITAL_COMMUNITY): Payer: BC Managed Care – PPO | Admitting: Occupational Therapy

## 2018-11-25 ENCOUNTER — Ambulatory Visit: Payer: BC Managed Care – PPO | Admitting: Orthopedic Surgery

## 2018-11-26 ENCOUNTER — Other Ambulatory Visit: Payer: Self-pay

## 2018-11-26 ENCOUNTER — Ambulatory Visit (HOSPITAL_COMMUNITY): Payer: BC Managed Care – PPO | Admitting: Occupational Therapy

## 2018-11-26 ENCOUNTER — Encounter (HOSPITAL_COMMUNITY): Payer: Self-pay | Admitting: Occupational Therapy

## 2018-11-26 DIAGNOSIS — R29898 Other symptoms and signs involving the musculoskeletal system: Secondary | ICD-10-CM

## 2018-11-26 DIAGNOSIS — M25511 Pain in right shoulder: Secondary | ICD-10-CM

## 2018-11-26 DIAGNOSIS — M25512 Pain in left shoulder: Secondary | ICD-10-CM | POA: Diagnosis not present

## 2018-11-26 NOTE — Patient Instructions (Signed)

## 2018-11-26 NOTE — Therapy (Signed)
[place], [place], [address] Phone: [phone]   Fax:  [phone]  Occupational Therapy Treatment  Patient Details  Name: Jacqueline [name] MRN: [id number] Date of Birth: [birth year] No data recorded  Encounter Date: 11/26/2018  OT End of Session - 11/26/18 1702    Visit Number  4    Number of Visits  16    Date for OT Re-Evaluation  01/05/19    Authorization Type  BCBS     Authorization Time Period  60 visit limit    Authorization - Visit Number  4    Authorization - Number of Visits  60    OT Start Time  1617    OT Stop Time  1700    OT Time Calculation (min)  43 min    Activity Tolerance  Patient tolerated treatment well    Behavior During Therapy  [facility] for tasks assessed/performed       Past Medical History:  Diagnosis Date  . Anemia   . Fibroid uterus   . GERD (gastroesophageal reflux disease)   . Hyperlipidemia    Borderline  . Pre-diabetes    borderline    Past Surgical History:  Procedure Laterality Date  . CHOLECYSTECTOMY    . COLONOSCOPY  2003   Dr. Gala Romney: Anal canal hemorrhoids  . COLONOSCOPY N/A 08/25/2014   Procedure: COLONOSCOPY;  Surgeon: Daneil Dolin, MD;  [facility];  Service: Endoscopy;  Laterality: N/A;  1200  . ESOPHAGOGASTRODUODENOSCOPY N/A 08/25/2014   Procedure: ESOPHAGOGASTRODUODENOSCOPY (EGD);  Surgeon: Daneil Dolin, MD;  Location[facility];  Service: Endoscopy;  Laterality: N/A;  . SALPINGOOPHORECTOMY Bilateral 11/06/2016   Procedure: BILATERAL SALPINGO OOPHORECTOMY;  Surgeon: Florian Buff, MD;  Location: [facility];  Service: Gynecology;  Laterality: Bilateral;  . SUPRACERVICAL ABDOMINAL HYSTERECTOMY N/A 11/06/2016   Procedure: HYSTERECTOMY SUPRACERVICAL ABDOMINAL;  Surgeon: Florian Buff, MD;  Location: [facility];  Service: Gynecology;  Laterality: N/A;  . TUBAL LIGATION      There were no vitals filed for this visit.  Subjective Assessment - 11/26/18 1617     Subjective   S: They're about half and half today.    Currently in Pain?  Yes    Pain Score  6     Pain Location  Shoulder    Pain Orientation  Right;Left    Pain Descriptors / Indicators  Sore    Pain Type  Acute pain    Pain Radiating Towards  upper arms, back    Pain Onset  1 to 4 weeks ago    Pain Frequency  Constant    Aggravating Factors   movement    Pain Relieving Factors  rest    Effect of Pain on Daily Activities  mod effect on ADLs    Multiple Pain Sites  No         [facility] OT Assessment - 11/26/18 1617      Assessment   Medical Diagnosis  bilateral shoulder pain      Precautions   Precautions  None               OT Treatments/Exercises (OP) - 11/26/18 1622      Exercises   Exercises  Shoulder      Shoulder Exercises: Supine   Protraction  PROM;5 reps;AROM;10 reps    Horizontal ABduction  PROM;5 reps;AROM;10 reps    External Rotation  PROM;5 reps;AROM;10 reps    Internal Rotation  PROM;5 reps;AROM;10 reps    Flexion  PROM;5 reps;AROM;10 reps    ABduction  PROM;5 reps;AROM;10 reps      Shoulder Exercises: Seated   Protraction  AROM;10 reps    Horizontal ABduction  AROM;10 reps    External Rotation  AROM;10 reps    Internal Rotation  AROM;10 reps    Flexion  AROM;10 reps    Abduction  AROM;10 reps      Shoulder Exercises: Standing   Extension  Theraband;10 reps    Theraband Level (Shoulder Extension)  Level 2 (Red)    Row  Theraband;10 reps    Theraband Level (Shoulder Row)  Level 2 (Red)    Retraction  Theraband;10 reps    Theraband Level (Shoulder Retraction)  Level 2 (Red)      Shoulder Exercises: ROM/Strengthening   UBE (Upper Arm Bike)  Level 1 3' reverse    X to V Arms  10X    Proximal Shoulder Strengthening, Seated  10X each no rest breaks    Other ROM/Strengthening Exercises  ball pass behind back using tennis ball working on IR, 10X each direction      Manual Therapy   Manual Therapy  Myofascial release    Manual therapy  comments  completed seperately from all other interventions     Myofascial Release  myofascial release and manual stretching to right and left shoulder, upper arm, and scapular region to decrease fascial restrictions and improved pain free mobility in bilateral shoulder regions.              OT Education - 11/26/18 1644    Education Details  A/ROM exercises    Person(s) Educated  Patient    Methods  Explanation;Demonstration;Handout    Comprehension  Verbalized understanding;Returned demonstration       OT Short Term Goals - 11/10/18 1555      OT SHORT TERM GOAL #1   Title  Pt will be provided with HEP to improve mobility required for ADL completion using BUE.     Time  4    Period  Weeks    Status  On-going    Target Date  12/06/18      OT SHORT TERM GOAL #2   Title  Pt will decrease pain in BUE to 5/10 to improve ability to perform dressing tasks with minimal compensatory movements.     Time  4    Period  Weeks    Status  On-going      OT SHORT TERM GOAL #3   Title  Pt will increase BUE P/ROM to [facility] to improve ability to complete low level reach tasks required for dressing and bathing.     Time  4    Period  Weeks    Status  On-going      OT SHORT TERM GOAL #4   Title  Pt will increase strength in BUE to 4-/5 to improve ability to complete reaching and lifting tasks below shoulder level.     Time  4    Period  Weeks    Status  On-going        OT Long Term Goals - 11/10/18 1555      OT LONG TERM GOAL #1   Title  Pt will return to highest level of functioning using BUE as dominant and non-dominant during ADL, work, and leisure tasks without need for compensatory strategies.     Time  8    Period  Weeks    Status  On-going      OT LONG TERM GOAL #2   Title  Pt will decrease pain in BUE to 3/10 or less to improve ability to sleep in preferred position.     Time  8    Period  Weeks    Status  On-going      OT LONG TERM GOAL #3   Title  Pt will increase BUE  A/ROM to [facility] to improve ability to reach up to fix hair and behind back for bathing/dressing tasks.     Time  8    Period  Weeks    Status  On-going      OT LONG TERM GOAL #4   Title  Pt will decrease fascial restrictions in BUE to minimal amounts or less to improve mobility required for functional reaching tasks.     Time  8    Period  Weeks    Status  On-going      OT LONG TERM GOAL #5   Title  Pt will improve strength in BUE to 4+/5 or greater to improve ability to perform work tasks in the allotted time.     Time  8    Period  Weeks    Status  On-going            Plan - 11/26/18 1640    Clinical Impression Statement  A: Pt reports continued soreness in BUE. Continued with manual therapy to address fascial restrictions in bilateral upper arm and trapezius regions. Pt with ROM WNL today, slight tightening at end range. Progressed to all A/ROM today and added x to v arms.Verbal cuing for form and techique.    Body Structure / Function / Physical Skills  ADL;Strength;Pain;Edema;UE functional use;IADL;ROM;Fascial restriction;Mobility    Plan  P: Continue with manual techniques, follow up on HEP, attempt Y lift off       Patient will benefit from skilled therapeutic intervention in order to improve the following deficits and impairments:   Body Structure / Function / Physical Skills: ADL, Strength, Pain, Edema, UE functional use, IADL, ROM, Fascial restriction, Mobility       Visit Diagnosis: 1. Acute pain of left shoulder   2. Acute pain of right shoulder   3. Other symptoms and signs involving the musculoskeletal system       Problem List Patient Active Problem List   Diagnosis Date Noted  . Hypertension 06/30/2018  . BMI 32.0-32.9,adult 12/10/2017  . S/P abdominal supracervical subtotal hysterectomy 11/06/2016  . Colon cancer screening   . Gastroesophageal reflux disease with esophagitis   . Anemia 08/04/2014  . GERD (gastroesophageal reflux disease) 08/04/2014   . Shoulder strain 07/26/2014  . Degenerative arthritis of thumb 07/26/2014  . Left knee pain 02/28/2014  . Hip pain 04/15/2013  . Epicondylitis, lateral (tennis elbow) 04/13/2013  . Constipation 09/15/2012  . Hyperlipidemia 11/28/2011  . Obesity, Class I, BMI 30.0-34.9 (see actual BMI) 09/15/2011  . ALLERGIC RHINITIS, SEASONAL 11/13/2007   Guadelupe Sabin, OTR/L  [phone] 11/26/2018, 5:04 PM  [place] [address] [place], [place], [address] Phone: [phone]   Fax:  [phone]  Name: Jacqueline [name] MRN: [id number] Date of Birth: August 19, 1960

## 2018-11-30 ENCOUNTER — Telehealth (HOSPITAL_COMMUNITY): Payer: Self-pay

## 2018-11-30 ENCOUNTER — Ambulatory Visit (HOSPITAL_COMMUNITY): Payer: BC Managed Care – PPO

## 2018-11-30 NOTE — Telephone Encounter (Signed)
She had MD appointment and will not be here today

## 2018-12-02 ENCOUNTER — Ambulatory Visit (HOSPITAL_COMMUNITY): Payer: BC Managed Care – PPO

## 2018-12-02 ENCOUNTER — Other Ambulatory Visit: Payer: Self-pay

## 2018-12-02 DIAGNOSIS — M25512 Pain in left shoulder: Secondary | ICD-10-CM | POA: Diagnosis not present

## 2018-12-02 DIAGNOSIS — R29898 Other symptoms and signs involving the musculoskeletal system: Secondary | ICD-10-CM

## 2018-12-02 DIAGNOSIS — M25511 Pain in right shoulder: Secondary | ICD-10-CM

## 2018-12-02 NOTE — Therapy (Signed)
[place], [place], [address] Phone: [phone]   Fax:  [phone]  Occupational Therapy Treatment  Patient Details  Name: Jacqueline [name] MRN: [id number] Date of Birth: February 27, 1961 No data recorded  Encounter Date: 12/02/2018  OT End of Session - 12/02/18 1732    Visit Number  5    Number of Visits  16    Date for OT Re-Evaluation  01/05/19    Authorization Type  BCBS     Authorization Time Period  60 visit limit    Authorization - Visit Number  5    Authorization - Number of Visits  80    OT Start Time  1440   Pt was checked in but at the desk paying and talking about her schedule   OT Stop Time  1515    OT Time Calculation (min)  35 min    Activity Tolerance  Patient tolerated treatment well    Behavior During Therapy  [facility] for tasks assessed/performed       Past Medical History:  Diagnosis Date  . Anemia   . Fibroid uterus   . GERD (gastroesophageal reflux disease)   . Hyperlipidemia    Borderline  . Pre-diabetes    borderline    Past Surgical History:  Procedure Laterality Date  . CHOLECYSTECTOMY    . COLONOSCOPY  2003   Dr. Gala Romney: Anal canal hemorrhoids  . COLONOSCOPY N/A 08/25/2014   Procedure: COLONOSCOPY;  Surgeon: Daneil Dolin, MD;  [facility];  Service: Endoscopy;  Laterality: N/A;  1200  . ESOPHAGOGASTRODUODENOSCOPY N/A 08/25/2014   Procedure: ESOPHAGOGASTRODUODENOSCOPY (EGD);  Surgeon: Daneil Dolin, MD;  Location: [facility];  Service: Endoscopy;  Laterality: N/A;  . SALPINGOOPHORECTOMY Bilateral 11/06/2016   Procedure: BILATERAL SALPINGO OOPHORECTOMY;  Surgeon: Florian Buff, MD;  Location: [facility];  Service: Gynecology;  Laterality: Bilateral;  . SUPRACERVICAL ABDOMINAL HYSTERECTOMY N/A 11/06/2016   Procedure: HYSTERECTOMY SUPRACERVICAL ABDOMINAL;  Surgeon: Florian Buff, MD;  Location: [facility];  Service: Gynecology;  Laterality: N/A;  . TUBAL LIGATION      There  were no vitals filed for this visit.  Subjective Assessment - 12/02/18 1732    Subjective   S: I"m getting married Saturday so I won't be here for  2 weeks. Then I was wondering if I could go down to 1 time a week because of the money.    Currently in Pain?  No/denies         [facility] OT Assessment - 12/02/18 1735      Assessment   Medical Diagnosis  bilateral shoulder pain      Precautions   Precautions  None               OT Treatments/Exercises (OP) - 12/02/18 1451      Exercises   Exercises  Shoulder      Shoulder Exercises: Supine   Protraction  PROM;5 reps    Horizontal ABduction  PROM;5 reps    External Rotation  PROM;5 reps    Internal Rotation  PROM;5 reps    Flexion  PROM;5 reps    ABduction  PROM;5 reps      Shoulder Exercises: Standing   Protraction  AROM;12 reps    Horizontal ABduction  AROM;12 reps    External Rotation  AROM;12 reps    Internal Rotation  AROM;12 reps    Flexion  AROM;12 reps    ABduction  AROM;12  reps    Extension  Theraband;10 reps    Theraband Level (Shoulder Extension)  Level 2 (Red)    Row  Theraband;10 reps    Theraband Level (Shoulder Row)  Level 2 (Red)    Retraction  Theraband;10 reps    Theraband Level (Shoulder Retraction)  Level 2 (Red)      Shoulder Exercises: ROM/Strengthening   UBE (Upper Arm Bike)  Level 1 3' reverse   pace: 5.0-6.0   Over Head Lace  2'- unlacing with left, lacing with right    X to V Arms  10X    Other ROM/Strengthening Exercises  ball pass behind back using tennis ball working on IR, 10X each direction    Other ROM/Strengthening Exercises  Y arm lift off; 10X;                OT Short Term Goals - 11/10/18 1555      OT SHORT TERM GOAL #1   Title  Pt will be provided with HEP to improve mobility required for ADL completion using BUE.     Time  4    Period  Weeks    Status  On-going    Target Date  12/06/18      OT SHORT TERM GOAL #2   Title  Pt will decrease pain in BUE to  5/10 to improve ability to perform dressing tasks with minimal compensatory movements.     Time  4    Period  Weeks    Status  On-going      OT SHORT TERM GOAL #3   Title  Pt will increase BUE P/ROM to [facility] to improve ability to complete low level reach tasks required for dressing and bathing.     Time  4    Period  Weeks    Status  On-going      OT SHORT TERM GOAL #4   Title  Pt will increase strength in BUE to 4-/5 to improve ability to complete reaching and lifting tasks below shoulder level.     Time  4    Period  Weeks    Status  On-going        OT Long Term Goals - 11/10/18 1555      OT LONG TERM GOAL #1   Title  Pt will return to highest level of functioning using BUE as dominant and non-dominant during ADL, work, and leisure tasks without need for compensatory strategies.     Time  8    Period  Weeks    Status  On-going      OT LONG TERM GOAL #2   Title  Pt will decrease pain in BUE to 3/10 or less to improve ability to sleep in preferred position.     Time  8    Period  Weeks    Status  On-going      OT LONG TERM GOAL #3   Title  Pt will increase BUE A/ROM to [facility] to improve ability to reach up to fix hair and behind back for bathing/dressing tasks.     Time  8    Period  Weeks    Status  On-going      OT LONG TERM GOAL #4   Title  Pt will decrease fascial restrictions in BUE to minimal amounts or less to improve mobility required for functional reaching tasks.     Time  8    Period  Weeks    Status  On-going  OT LONG TERM GOAL #5   Title  Pt will improve strength in BUE to 4+/5 or greater to improve ability to perform work tasks in the allotted time.     Time  8    Period  Weeks    Status  On-going            Plan - 12/02/18 1733    Clinical Impression Statement  A: Manual therapy not completed due to time constraint. Focused on functional movement during A/ROM of the shoulder. patient was able to complete Y arms lift off to work on scapular  strength. Required VC for form and technique during X to V arms. Reports fatigue and mild soreness at end of session.    Body Structure / Function / Physical Skills  ADL;Strength;Pain;Edema;UE functional use;IADL;ROM;Fascial restriction;Mobility    Plan  P: Update HEP. Progress to 1# weight for shoulder and scapular strengthening prone if able to tolerate.    Consulted and Agree with Plan of Care  Patient       Patient will benefit from skilled therapeutic intervention in order to improve the following deficits and impairments:   Body Structure / Function / Physical Skills: ADL, Strength, Pain, Edema, UE functional use, IADL, ROM, Fascial restriction, Mobility       Visit Diagnosis: 1. Acute pain of right shoulder   2. Other symptoms and signs involving the musculoskeletal system   3. Acute pain of left shoulder       Problem List Patient Active Problem List   Diagnosis Date Noted  . Hypertension 06/30/2018  . BMI 32.0-32.9,adult 12/10/2017  . S/P abdominal supracervical subtotal hysterectomy 11/06/2016  . Colon cancer screening   . Gastroesophageal reflux disease with esophagitis   . Anemia 08/04/2014  . GERD (gastroesophageal reflux disease) 08/04/2014  . Shoulder strain 07/26/2014  . Degenerative arthritis of thumb 07/26/2014  . Left knee pain 02/28/2014  . Hip pain 04/15/2013  . Epicondylitis, lateral (tennis elbow) 04/13/2013  . Constipation 09/15/2012  . Hyperlipidemia 11/28/2011  . Obesity, Class I, BMI 30.0-34.9 (see actual BMI) 09/15/2011  . ALLERGIC RHINITIS, SEASONAL 11/13/2007   Ailene Ravel, OTR/L,CBIS  [phone]  12/02/2018, 5:36 PM  [place] [address] [place], [place], [address] Phone: [phone]   Fax:  [phone]  Name: Jacqueline [name] MRN: [id number] Date of Birth: 01/17/1961

## 2018-12-08 ENCOUNTER — Ambulatory Visit (HOSPITAL_COMMUNITY): Payer: BC Managed Care – PPO | Admitting: Occupational Therapy

## 2018-12-10 ENCOUNTER — Encounter (HOSPITAL_COMMUNITY): Payer: BLUE CROSS/BLUE SHIELD | Admitting: Occupational Therapy

## 2018-12-14 ENCOUNTER — Encounter: Payer: BLUE CROSS/BLUE SHIELD | Admitting: Family Medicine

## 2018-12-15 ENCOUNTER — Encounter (HOSPITAL_COMMUNITY): Payer: BLUE CROSS/BLUE SHIELD | Admitting: Occupational Therapy

## 2018-12-17 ENCOUNTER — Encounter (HOSPITAL_COMMUNITY): Payer: BLUE CROSS/BLUE SHIELD | Admitting: Occupational Therapy

## 2018-12-21 ENCOUNTER — Encounter (HOSPITAL_COMMUNITY): Payer: Self-pay

## 2018-12-21 ENCOUNTER — Other Ambulatory Visit: Payer: Self-pay

## 2018-12-21 ENCOUNTER — Ambulatory Visit (HOSPITAL_COMMUNITY): Payer: BC Managed Care – PPO | Attending: Family Medicine

## 2018-12-21 DIAGNOSIS — M25511 Pain in right shoulder: Secondary | ICD-10-CM | POA: Diagnosis present

## 2018-12-21 DIAGNOSIS — R29898 Other symptoms and signs involving the musculoskeletal system: Secondary | ICD-10-CM | POA: Diagnosis present

## 2018-12-21 DIAGNOSIS — M25512 Pain in left shoulder: Secondary | ICD-10-CM | POA: Insufficient documentation

## 2018-12-21 NOTE — Patient Instructions (Addendum)
Strengthening: Chest Pull - Resisted   Hold Theraband in front of body with hands about shoulder width a part. Pull band a part and back together slowly. Repeat _10-15___ times. Complete __1__ set(s) per session.. Repeat __1-2__ session(s) per day.  http://orth.exer.us/926   Copyright  VHI. All rights reserved.   PNF Strengthening: Resisted   Standing with resistive band around each hand, bring right arm up and away, thumb back. Complete 10-15 repetitions with each arm.  Do __1__ sets per session. Do _1-2___ sessions per day.    Resisted External Rotation: in Neutral - Bilateral   Sit or stand, tubing in both hands, elbows at sides, bent to 90, forearms forward. Pinch shoulder blades together and rotate forearms out. Keep elbows at sides. Repeat _10-15___ times per set. Do __1__ sets per session. Do __1-2__ sessions per day.  http://orth.exer.us/966   Copyright  VHI. All rights reserved.   PNF Strengthening: Resisted   Standing, hold resistive band above head. Bring right arm down and out from side. Complete 1-15 repetitions with each arm. Do _1___ sets per session. Do ___1-2_ sessions per day.  http://orth.exer.us/922   Copyright  VHI. All rights reserved.

## 2018-12-21 NOTE — Therapy (Signed)
Jacqueline [name], [place], [address] Phone: [phone]   Fax:  [phone]  Occupational Therapy Treatment  Patient Details  Name: Jacqueline [name] MRN: [id number] Date of Birth: Jan 23, 1961 No data recorded  Encounter Date: 12/21/2018  OT End of Session - 12/21/18 1605    Visit Number  6    Number of Visits  16    Date for OT Re-Evaluation  01/05/19    Authorization Type  BCBS     Authorization Time Period  60 visit limit    Authorization - Visit Number  6    Authorization - Number of Visits  60    OT Start Time  7001    OT Stop Time  1617    OT Time Calculation (min)  39 min    Activity Tolerance  Patient tolerated treatment well    Behavior During Therapy  [facility] for tasks assessed/performed       Past Medical History:  Diagnosis Date  . Anemia   . Fibroid uterus   . GERD (gastroesophageal reflux disease)   . Hyperlipidemia    Borderline  . Pre-diabetes    borderline    Past Surgical History:  Procedure Laterality Date  . CHOLECYSTECTOMY    . COLONOSCOPY  2003   Dr. Gala Romney: Anal canal hemorrhoids  . COLONOSCOPY N/A 08/25/2014   Procedure: COLONOSCOPY;  Surgeon: Daneil Dolin, MD;  [facility];  Service: Endoscopy;  Laterality: N/A;  1200  . ESOPHAGOGASTRODUODENOSCOPY N/A 08/25/2014   Procedure: ESOPHAGOGASTRODUODENOSCOPY (EGD);  Surgeon: Daneil Dolin, MD;  [facility];  Service: Endoscopy;  Laterality: N/A;  . SALPINGOOPHORECTOMY Bilateral 11/06/2016   Procedure: BILATERAL SALPINGO OOPHORECTOMY;  Surgeon: Florian Buff, MD;  Location: [facility];  Service: Gynecology;  Laterality: Bilateral;  . SUPRACERVICAL ABDOMINAL HYSTERECTOMY N/A 11/06/2016   Procedure: HYSTERECTOMY SUPRACERVICAL ABDOMINAL;  Surgeon: Florian Buff, MD;  Location: [facility];  Service: Gynecology;  Laterality: N/A;  . TUBAL LIGATION      There were no vitals filed for this visit.  Subjective Assessment - 12/21/18 1602     Subjective   S: My right shoulder locked up on me once but it's ok right now.    Currently in Pain?  No/denies         [facility] OT Assessment - 12/21/18 1602      Assessment   Medical Diagnosis  bilateral shoulder pain      Precautions   Precautions  None               OT Treatments/Exercises (OP) - 12/21/18 0001      Exercises   Exercises  Shoulder      Shoulder Exercises: Supine   Protraction  PROM;5 reps    Horizontal ABduction  PROM;5 reps    External Rotation  PROM;5 reps    Internal Rotation  PROM;5 reps    Flexion  PROM;5 reps    ABduction  PROM;5 reps      Shoulder Exercises: Standing   Horizontal ABduction  Theraband;10 reps    Theraband Level (Shoulder Horizontal ABduction)  Level 2 (Red)    External Rotation  Theraband;10 reps    Theraband Level (Shoulder External Rotation)  Level 2 (Red)    Internal Rotation  Theraband;10 reps    Theraband Level (Shoulder Internal Rotation)  Level 2 (Red)    Flexion  Strengthening;10 reps    Shoulder Flexion Weight (lbs)  1  ABduction  Theraband;12 reps    Theraband Level (Shoulder ABduction)  Level 2 (Red)    Other Standing Exercises  red theraband; adduction; 10X      Shoulder Exercises: ROM/Strengthening   UBE (Upper Arm Bike)  Level 2 3' reverse    pace: 6.0-7.0   X to V Arms  12X with 1#    Proximal Shoulder Strengthening, Seated  10X with 1# no rest breaks      Manual Therapy   Manual Therapy  Myofascial release    Manual therapy comments  completed seperately from all other interventions     Myofascial Release  myofascial release and manual stretching to right and left shoulder, upper arm, and scapular region to decrease fascial restrictions and improved pain free mobility in bilateral shoulder regions.              OT Education - 12/21/18 1602    Education Details  strengthening with red theraband    Person(s) Educated  Patient    Methods  Explanation;Demonstration;Verbal cues;Handout     Comprehension  Returned demonstration;Verbalized understanding       OT Short Term Goals - 11/10/18 1555      OT SHORT TERM GOAL #1   Title  Pt will be provided with HEP to improve mobility required for ADL completion using BUE.     Time  4    Period  Weeks    Status  On-going    Target Date  12/06/18      OT SHORT TERM GOAL #2   Title  Pt will decrease pain in BUE to 5/10 to improve ability to perform dressing tasks with minimal compensatory movements.     Time  4    Period  Weeks    Status  On-going      OT SHORT TERM GOAL #3   Title  Pt will increase BUE P/ROM to [facility] to improve ability to complete low level reach tasks required for dressing and bathing.     Time  4    Period  Weeks    Status  On-going      OT SHORT TERM GOAL #4   Title  Pt will increase strength in BUE to 4-/5 to improve ability to complete reaching and lifting tasks below shoulder level.     Time  4    Period  Weeks    Status  On-going        OT Long Term Goals - 11/10/18 1555      OT LONG TERM GOAL #1   Title  Pt will return to highest level of functioning using BUE as dominant and non-dominant during ADL, work, and leisure tasks without need for compensatory strategies.     Time  8    Period  Weeks    Status  On-going      OT LONG TERM GOAL #2   Title  Pt will decrease pain in BUE to 3/10 or less to improve ability to sleep in preferred position.     Time  8    Period  Weeks    Status  On-going      OT LONG TERM GOAL #3   Title  Pt will increase BUE A/ROM to [facility] to improve ability to reach up to fix hair and behind back for bathing/dressing tasks.     Time  8    Period  Weeks    Status  On-going      OT LONG TERM  GOAL #4   Title  Pt will decrease fascial restrictions in BUE to minimal amounts or less to improve mobility required for functional reaching tasks.     Time  8    Period  Weeks    Status  On-going      OT LONG TERM GOAL #5   Title  Pt will improve strength in BUE to 4+/5 or  greater to improve ability to perform work tasks in the allotted time.     Time  8    Period  Weeks    Status  On-going            Plan - 12/21/18 1615    Clinical Impression Statement  A: Progressed to strengthening. patient was unable to complete prone strengethening as transitioning to prone was physically difficult due to history of back issues. Updated HEP to shoulder strengthening. VC were provided for form and technique and for exercise modifcation as needed.    Body Structure / Function / Physical Skills  ADL;Strength;Pain;Edema;UE functional use;IADL;ROM;Fascial restriction;Mobility    Plan  P: Reassessment. Determine if additional appointments are needed. Follow up on HEP. Add scapular strengthening with theraband to HEP if appropriate. Add therapy ball shoulder stability. D/C passive stretching. Complete myofascial release if pain is present.    Consulted and Agree with Plan of Care  Patient       Patient will benefit from skilled therapeutic intervention in order to improve the following deficits and impairments:   Body Structure / Function / Physical Skills: ADL, Strength, Pain, Edema, UE functional use, IADL, ROM, Fascial restriction, Mobility       Visit Diagnosis: 1. Acute pain of right shoulder   2. Other symptoms and signs involving the musculoskeletal system   3. Acute pain of left shoulder       Problem List Patient Active Problem List   Diagnosis Date Noted  . Hypertension 06/30/2018  . BMI 32.0-32.9,adult 12/10/2017  . S/P abdominal supracervical subtotal hysterectomy 11/06/2016  . Colon cancer screening   . Gastroesophageal reflux disease with esophagitis   . Anemia 08/04/2014  . GERD (gastroesophageal reflux disease) 08/04/2014  . Shoulder strain 07/26/2014  . Degenerative arthritis of thumb 07/26/2014  . Left knee pain 02/28/2014  . Hip pain 04/15/2013  . Epicondylitis, lateral (tennis elbow) 04/13/2013  . Constipation 09/15/2012  .  Hyperlipidemia 11/28/2011  . Obesity, Class I, BMI 30.0-34.9 (see actual BMI) 09/15/2011  . ALLERGIC RHINITIS, SEASONAL 11/13/2007   Ailene Ravel, OTR/L,CBIS  [phone]  12/21/2018, 4:31 PM  [facility] [address] [place], [place], [address] Phone: [phone]   Fax:  [phone]  Name: KEYRA VIRELLA MRN: [id number] Date of Birth: June 19, 1960

## 2018-12-23 ENCOUNTER — Ambulatory Visit (HOSPITAL_COMMUNITY): Payer: BC Managed Care – PPO

## 2018-12-25 ENCOUNTER — Other Ambulatory Visit: Payer: Self-pay

## 2018-12-25 ENCOUNTER — Ambulatory Visit: Payer: BLUE CROSS/BLUE SHIELD | Admitting: Family Medicine

## 2018-12-25 ENCOUNTER — Encounter: Payer: Self-pay | Admitting: Family Medicine

## 2018-12-25 VITALS — BP 110/70 | HR 79 | Temp 98.2°F | Resp 15 | Ht 62.5 in | Wt 182.2 lb

## 2018-12-25 DIAGNOSIS — Z0001 Encounter for general adult medical examination with abnormal findings: Secondary | ICD-10-CM

## 2018-12-25 DIAGNOSIS — E669 Obesity, unspecified: Secondary | ICD-10-CM

## 2018-12-25 DIAGNOSIS — I10 Essential (primary) hypertension: Secondary | ICD-10-CM

## 2018-12-25 DIAGNOSIS — E782 Mixed hyperlipidemia: Secondary | ICD-10-CM

## 2018-12-25 DIAGNOSIS — R7303 Prediabetes: Secondary | ICD-10-CM

## 2018-12-25 DIAGNOSIS — Z0000 Encounter for general adult medical examination without abnormal findings: Secondary | ICD-10-CM

## 2018-12-25 DIAGNOSIS — Z124 Encounter for screening for malignant neoplasm of cervix: Secondary | ICD-10-CM

## 2018-12-25 MED ORDER — MELOXICAM 15 MG PO TABS: 15.0000 mg | ORAL_TABLET | Freq: Every day | ORAL | 0 refills | Status: DC

## 2018-12-25 MED ORDER — AMLODIPINE BESYLATE 5 MG PO TABS: 5.0000 mg | ORAL_TABLET | Freq: Every day | ORAL | 2 refills | Status: DC

## 2018-12-25 MED ORDER — GABAPENTIN 300 MG PO CAPS: 300.0000 mg | ORAL_CAPSULE | Freq: Three times a day (TID) | ORAL | 3 refills | Status: DC

## 2018-12-25 NOTE — Progress Notes (Signed)
   Subjective:    Patient ID: Jacqueline [name], female    DOB: [birth year], 58 y.o.   MRN: [id number]  Patient presents for Annual Exam Here for complete physical exam. Immunizations up-to-date Mammogram UTD Dec 2019 Colonoscopy up-to-date 2016 Recently married  status Post supra-cervicalhysterectomy currently on estrogen cream - last PAP Smear 2018  Hypertension she is taking amlodipine 5 mg daily  She was seen back in May secondary to pain of her left shoulder she is currently in physical therapy  Borderline DM last A1C 5.8% in 2019  Dr. Jacelyn Grip- gives her epidural epidications  Dr. Lynann Bologna is spine orthopedics    Review Of Systems:  GEN- denies fatigue, fever, weight loss,weakness, recent illness HEENT- denies eye drainage, change in vision, nasal discharge, CVS- denies chest pain, palpitations RESP- denies SOB, cough, wheeze ABD- denies N/V, change in stools, abd pain GU- denies dysuria, hematuria, dribbling, incontinence MSK- + joint pain, muscle aches, injury Neuro- denies headache, dizziness, syncope, seizure activity       Objective:    BP 110/70   Pulse 79   Temp 98.2 F (36.8 C) (Oral)   Resp 15   Ht 5' 2.5" (1.588 m)   Wt 182 lb 4 oz (82.7 kg)   LMP 10/26/2016 (Exact Date)   SpO2 97%   BMI 32.80 kg/m  GEN- NAD, alert and oriented x3 HEENT- PERRL, EOMI, non injected sclera, pink conjunctiva, MMM, oropharynx clear Neck- Supple, no thyromegaly CVS- RRR, no murmur RESP-CTAB Breast- normal symmetry, no nipple inversion,no nipple drainage, no nodules or lumps felt Nodes- no axillary nodes ABD-NABS,soft,NT,ND GU- normal external genitalia, vaginal mucosa pink and moist, cervix visualized no growth, no blood form os, minimal thin clear discharge, no CMT, no ovarian masses, uterus normal siz EXT- No edema Pulses- Radial, DP- 2+   Fall/Depression negative      Assessment & Plan:      Problem List Items Addressed This Visit      Unprioritized   Borderline diabetes    Check A1C with fasting labs      Relevant Orders   Hemoglobin A1c (Completed)   Hyperlipidemia   Relevant Medications   amLODipine (NORVASC) 5 MG tablet   Other Relevant Orders   Lipid panel (Completed)   Hypertension - Primary    Well controlled no changes       Relevant Medications   amLODipine (NORVASC) 5 MG tablet   Other Relevant Orders   CBC with Differential/Platelet (Completed)   Comprehensive metabolic panel (Completed)   Obesity, Class I, BMI 30.0-34.9 (see actual BMI)    Other Visit Diagnoses    Cervical cancer screening       Relevant Orders   Pap IG w/ reflex to HPV when ASC-U   Routine general medical examination at a health care facility       CPE done, PAP Smear done, check fasting labs, for shoulder if no improvement with PT recomend she see ortho      Note: This dictation was prepared with Dragon dictation along with smaller phrase technology. Any transcriptional errors that result from this process are unintentional.

## 2018-12-25 NOTE — Patient Instructions (Addendum)
F/U 6 months   we will call with lab results Call if shoulder not improving

## 2018-12-26 LAB — CBC WITH DIFFERENTIAL/PLATELET
Absolute Monocytes: 624 cells/uL (ref 200–950)
Basophils Absolute: 42 cells/uL (ref 0–200)
Basophils Relative: 0.7 %
Eosinophils Absolute: 132 cells/uL (ref 15–500)
Eosinophils Relative: 2.2 %
HCT: 35.7 % (ref 35.0–45.0)
Hemoglobin: 12.1 g/dL (ref 11.7–15.5)
Lymphs Abs: 2484 cells/uL (ref 850–3900)
MCH: 31 pg (ref 27.0–33.0)
MCHC: 33.9 g/dL (ref 32.0–36.0)
MCV: 91.5 fL (ref 80.0–100.0)
MPV: 12.5 fL (ref 7.5–12.5)
Monocytes Relative: 10.4 %
Neutro Abs: 2718 cells/uL (ref 1500–7800)
Neutrophils Relative %: 45.3 %
Platelets: 219 10*3/uL (ref 140–400)
RBC: 3.9 10*6/uL (ref 3.80–5.10)
RDW: 13.1 % (ref 11.0–15.0)
Total Lymphocyte: 41.4 %
WBC: 6 10*3/uL (ref 3.8–10.8)

## 2018-12-26 LAB — COMPREHENSIVE METABOLIC PANEL
AG Ratio: 1.3 (calc) (ref 1.0–2.5)
ALT: 14 U/L (ref 6–29)
AST: 19 U/L (ref 10–35)
Albumin: 4.3 g/dL (ref 3.6–5.1)
Alkaline phosphatase (APISO): 90 U/L (ref 37–153)
BUN: 14 mg/dL (ref 7–25)
CO2: 29 mmol/L (ref 20–32)
Calcium: 10.3 mg/dL (ref 8.6–10.4)
Chloride: 103 mmol/L (ref 98–110)
Creat: 1.03 mg/dL (ref 0.50–1.05)
Globulin: 3.3 g/dL (calc) (ref 1.9–3.7)
Glucose, Bld: 91 mg/dL (ref 65–99)
Potassium: 4.6 mmol/L (ref 3.5–5.3)
Sodium: 139 mmol/L (ref 135–146)
Total Bilirubin: 0.7 mg/dL (ref 0.2–1.2)
Total Protein: 7.6 g/dL (ref 6.1–8.1)

## 2018-12-26 LAB — LIPID PANEL
Cholesterol: 227 mg/dL — ABNORMAL HIGH (ref <200)
HDL: 48 mg/dL — ABNORMAL LOW (ref >=50)
LDL Cholesterol (Calc): 153 mg/dL (calc) — ABNORMAL HIGH
Non-HDL Cholesterol (Calc): 179 mg/dL (calc) — ABNORMAL HIGH (ref <130)
Total CHOL/HDL Ratio: 4.7 (calc) (ref <5.0)
Triglycerides: 133 mg/dL (ref <150)

## 2018-12-26 LAB — HEMOGLOBIN A1C
Hgb A1c MFr Bld: 5.9 % of total Hgb — ABNORMAL HIGH (ref <5.7)
Mean Plasma Glucose: 123 (calc)
eAG (mmol/L): 6.8 (calc)

## 2018-12-27 ENCOUNTER — Encounter: Payer: Self-pay | Admitting: Family Medicine

## 2018-12-27 IMAGING — MG DIGITAL SCREENING BILATERAL MAMMOGRAM WITH TOMO AND CAD
8 series · 8 of 24 positions shown · non-contrast
Comparison: Previous exam(s).

CLINICAL DATA: Screening.

EXAM:
DIGITAL SCREENING BILATERAL MAMMOGRAM WITH TOMO AND CAD

[R MLO synth-2D]
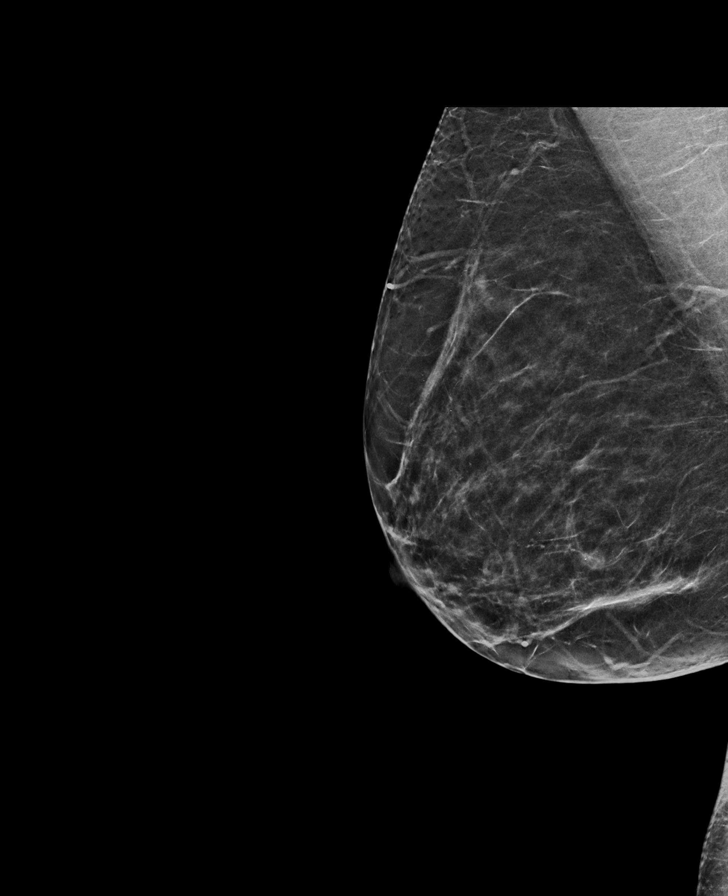

[L CC synth-2D]
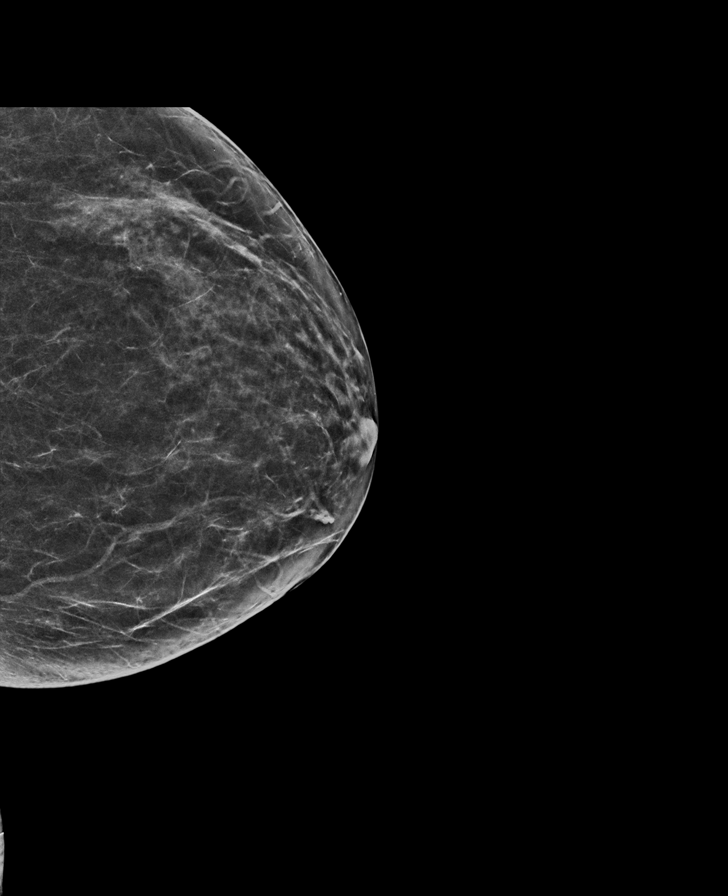

[R CC synth-2D]
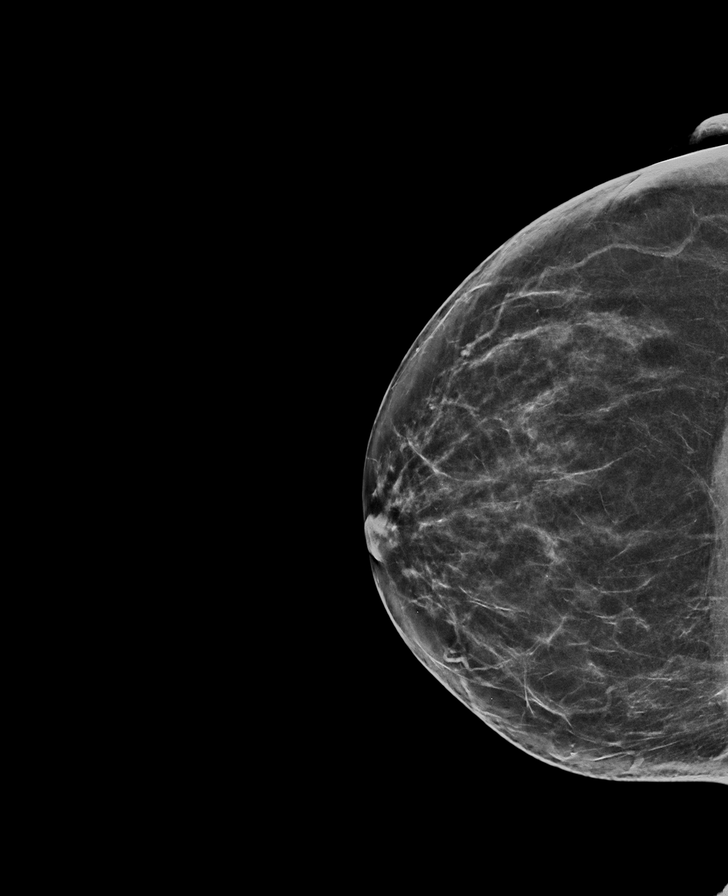

[L MLO synth-2D]
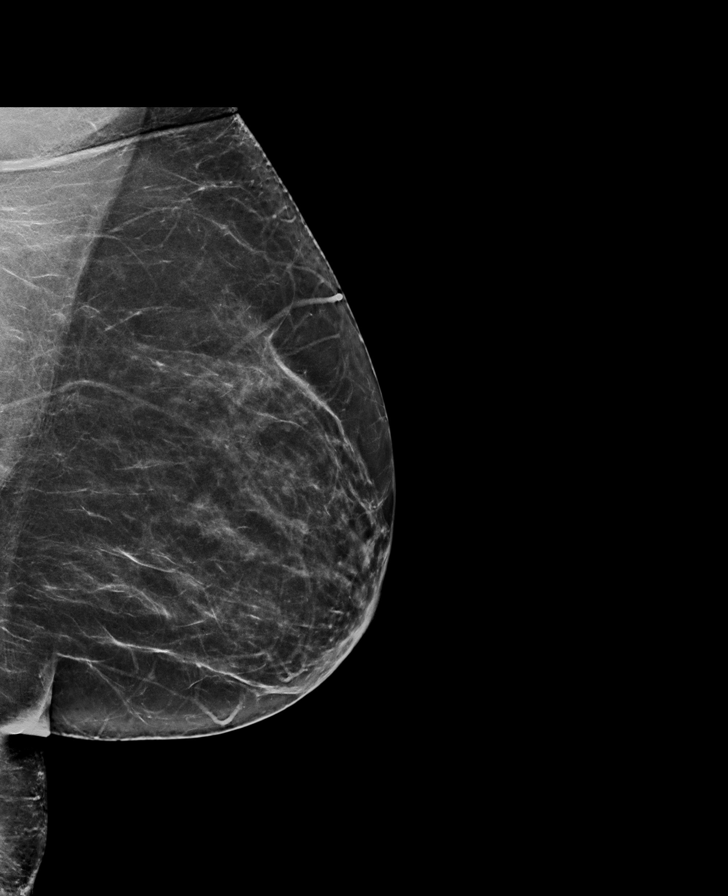

[L MLO tomo · tomo slice 35/70.0]
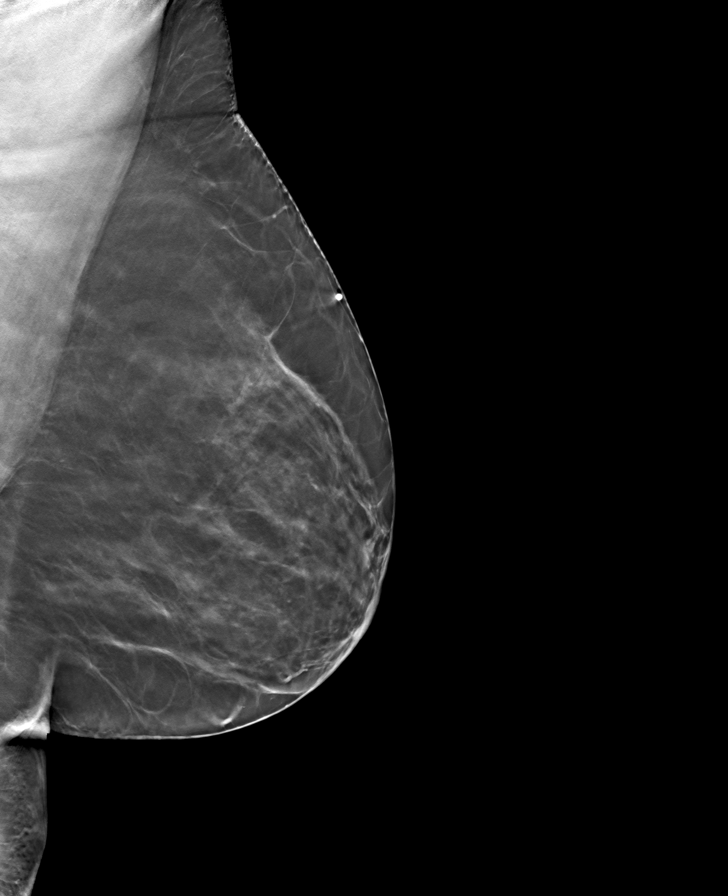

[R MLO tomo · tomo slice 33/66.0]
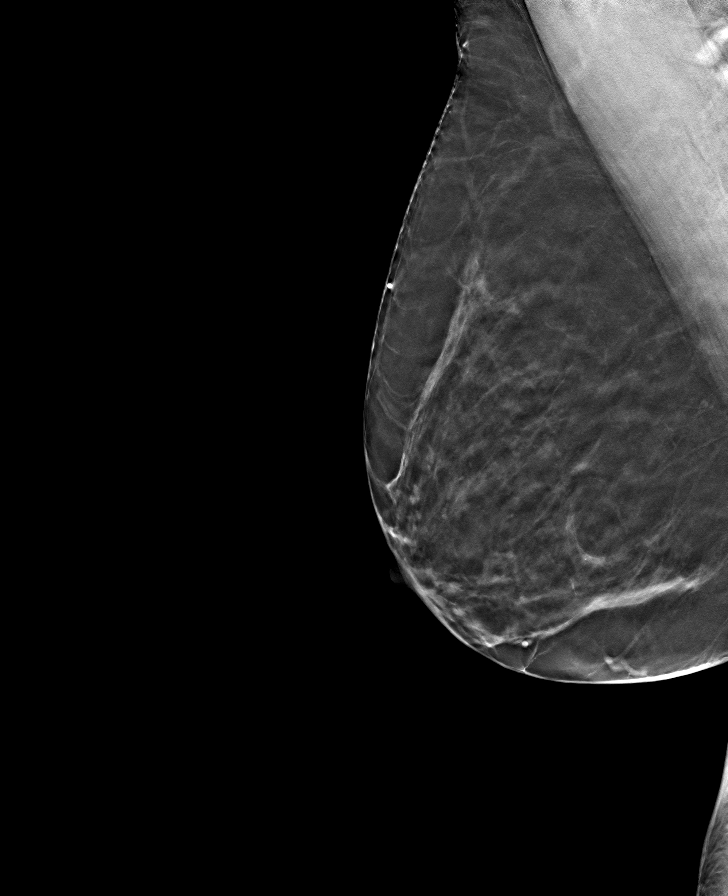

[L CC tomo · tomo slice 33/66.0]
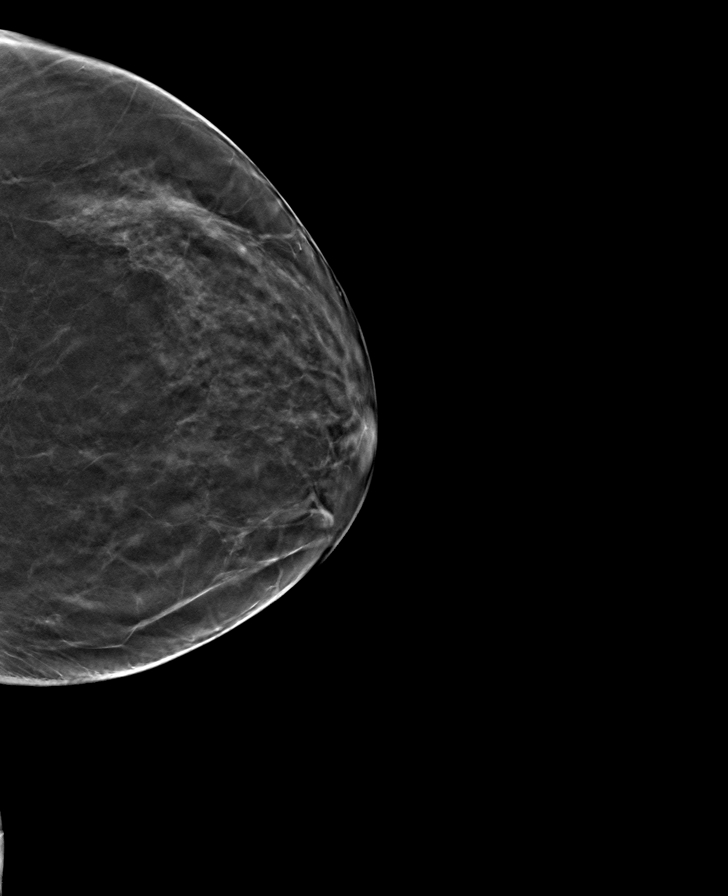

[R CC tomo · tomo slice 35/69.0]
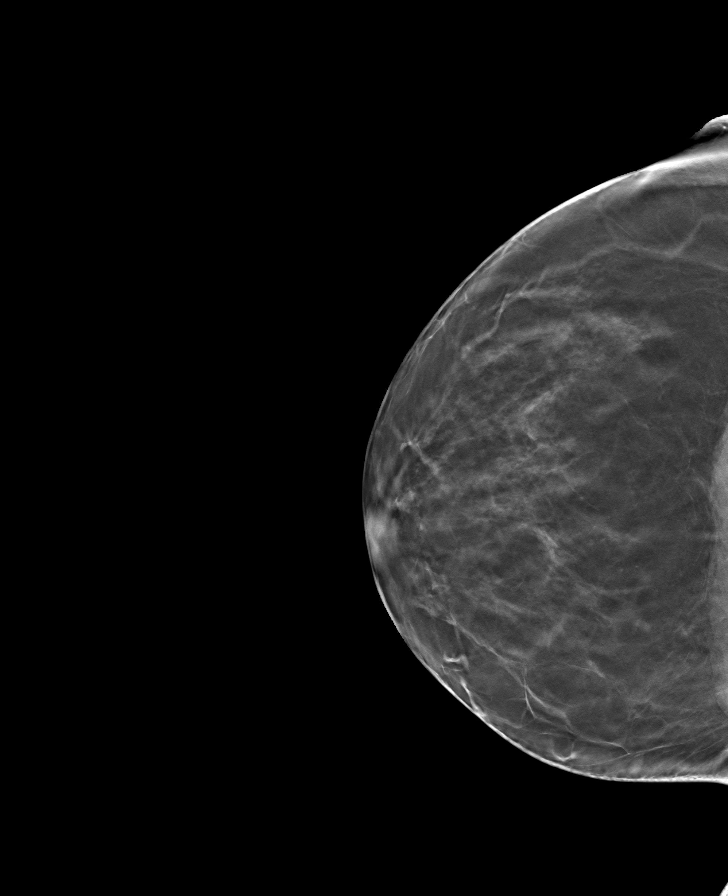

[8 of 24 positions shown; findings below may reference images not displayed]

ACR Breast Density Category b: There are scattered areas of
fibroglandular density.
FINDINGS: There are no findings suspicious for malignancy. Images were
processed with CAD.
IMPRESSION: No mammographic evidence of malignancy. A result letter of this
screening mammogram will be mailed directly to the patient.

RECOMMENDATION:
Screening mammogram in one year. (Code:[id number]-775)

BI-RADS CATEGORY  1: Negative.

## 2018-12-27 NOTE — Assessment & Plan Note (Signed)
Well controlled no changes 

## 2018-12-27 NOTE — Assessment & Plan Note (Signed)
Check A1C with fasting labs

## 2018-12-28 ENCOUNTER — Encounter (HOSPITAL_COMMUNITY): Payer: Self-pay | Admitting: Specialist

## 2018-12-28 ENCOUNTER — Other Ambulatory Visit: Payer: Self-pay

## 2018-12-28 ENCOUNTER — Ambulatory Visit (HOSPITAL_COMMUNITY): Payer: BC Managed Care – PPO | Admitting: Specialist

## 2018-12-28 DIAGNOSIS — M25511 Pain in right shoulder: Secondary | ICD-10-CM

## 2018-12-28 DIAGNOSIS — M25512 Pain in left shoulder: Secondary | ICD-10-CM

## 2018-12-28 DIAGNOSIS — R29898 Other symptoms and signs involving the musculoskeletal system: Secondary | ICD-10-CM

## 2018-12-28 NOTE — Patient Instructions (Signed)
(  Home) Extension: Isometric / Bilateral Arm Retraction - Sitting   Facing anchor, hold hands and elbow at shoulder height, with elbow bent.  Pull arms back to squeeze shoulder blades together. Repeat 10-15 times. 1-3 times/day.   (Clinic) Extension / Flexion (Assist)   Face anchor, pull arms back, keeping elbow straight, and squeze shoulder blades together. Repeat 10-15 times. 1-3 times/day.   Copyright  VHI. All rights reserved.   (Home) Retraction: Row - Bilateral (Anchor)   Facing anchor, arms reaching forward, pull hands toward stomach, keeping elbows bent and at your sides and pinching shoulder blades together. Repeat 10-15 times. 1-3 times/day.   Copyright  VHI. All rights reserved.  Strengthening: Resisted Internal Rotation    Hold tubing in left hand, elbow at side and forearm out. Rotate forearm in across body. Repeat ____ times per set. Do ____ sets per session. Do ____ sessions per day.  http://orth.exer.us/831   Copyright  VHI. All rights reserved.  Strengthening: Resisted External Rotation    Hold tubing in right hand, elbow at side and forearm across body. Rotate forearm out. Repeat ____ times per set. Do ____ sets per session. Do ____ sessions per day.  http://orth.exer.us/829   Copyright  VHI. All rights reserved.

## 2018-12-29 ENCOUNTER — Encounter (HOSPITAL_COMMUNITY): Payer: Self-pay | Admitting: Specialist

## 2018-12-29 LAB — PAP IG W/ RFLX HPV ASCU

## 2018-12-29 NOTE — Therapy (Signed)
[place], [place], [address] Phone: [phone]   Fax:  [phone]  Occupational Therapy Treatment  Patient Details  Name: Jacqueline [name] MRN: [id number] Date of Birth: [birth year] Referring Provider (OT): Dr. Vic Blackbird   Encounter Date: 12/28/2018  OT End of Session - 12/29/18 0910    Visit Number  7    Number of Visits  16    Date for OT Re-Evaluation  01/05/19    Authorization Type  [employer]     Authorization Time Period  60 visit limit    Authorization - Visit Number  7    Authorization - Number of Visits  66    OT Start Time  8875    OT Stop Time  1515    OT Time Calculation (min)  30 min    Activity Tolerance  Patient tolerated treatment well    Behavior During Therapy  [facility] for tasks assessed/performed       Past Medical History:  Diagnosis Date  . Anemia   . Fibroid uterus   . GERD (gastroesophageal reflux disease)   . Hyperlipidemia    Borderline  . Pre-diabetes    borderline    Past Surgical History:  Procedure Laterality Date  . CHOLECYSTECTOMY    . COLONOSCOPY  2003   Dr. Gala Romney: Anal canal hemorrhoids  . COLONOSCOPY N/A 08/25/2014   Procedure: COLONOSCOPY;  Surgeon: Daneil Dolin, MD;  Location: [facility];  Service: Endoscopy;  Laterality: N/A;  1200  . ESOPHAGOGASTRODUODENOSCOPY N/A 08/25/2014   Procedure: ESOPHAGOGASTRODUODENOSCOPY (EGD);  Surgeon: Daneil Dolin, MD;  Location: [facility];  Service: Endoscopy;  Laterality: N/A;  . SALPINGOOPHORECTOMY Bilateral 11/06/2016   Procedure: BILATERAL SALPINGO OOPHORECTOMY;  Surgeon: Florian Buff, MD;  Location: [facility] ORS;  Service: Gynecology;  Laterality: Bilateral;  . SUPRACERVICAL ABDOMINAL HYSTERECTOMY N/A 11/06/2016   Procedure: HYSTERECTOMY SUPRACERVICAL ABDOMINAL;  Surgeon: Florian Buff, MD;  Location: [facility] ORS;  Service: Gynecology;  Laterality: N/A;  . TUBAL LIGATION      There were no vitals filed for this  visit.  Subjective Assessment - 12/29/18 0910    Subjective   S:  I am doing pretty good with both of my arms.    Currently in Pain?  No/denies    Pain Score  0-No pain         [facility] OT Assessment - 12/29/18 0001      Assessment   Medical Diagnosis  bilateral shoulder pain    Referring Provider (OT)  Dr. Vic Blackbird      Precautions   Precautions  None      ADL   ADL comments  improved ability to complete all desired adls      Observation/Other Assessments   Focus on Therapeutic Outcomes (FOTO)   67/100      AROM   Overall AROM Comments  Assessed seated, er/IR adducted    Right Shoulder Flexion  155 Degrees   87   Right Shoulder ABduction  155 Degrees   85   Right Shoulder Internal Rotation  90 Degrees   90   Right Shoulder External Rotation  70 Degrees   61   Left Shoulder Flexion  150 Degrees   111   Left Shoulder ABduction  126 Degrees   83   Left Shoulder Internal Rotation  90 Degrees   90   Left Shoulder External Rotation  65 Degrees   63  PROM   Overall PROM Comments  WFL for all ADLs      Strength   Overall Strength Comments  Assessed seated, er/IR adducted    Right Shoulder Flexion  5/5   4-/5   Right Shoulder ABduction  5/5   3-/5   Right Shoulder Internal Rotation  5/5   4+/5   Right Shoulder External Rotation  5/5   4-/5   Left Shoulder Flexion  5/5   4-/5   Left Shoulder ABduction  5/5   3-/5   Left Shoulder Internal Rotation  5/5   4+/5   Left Shoulder External Rotation  5/5   4-/5              OT Treatments/Exercises (OP) - 12/29/18 0001      Shoulder Exercises: Supine   Protraction  PROM;5 reps    Horizontal ABduction  PROM;5 reps    External Rotation  PROM;5 reps    Internal Rotation  PROM;5 reps    Flexion  PROM;5 reps    ABduction  PROM;5 reps      Shoulder Exercises: Standing   External Rotation  Theraband;10 reps    Theraband Level (Shoulder External Rotation)  Level 3 (Green)    Internal Rotation   Theraband;10 reps    Theraband Level (Shoulder Internal Rotation)  Level 3 (Green)    Extension  Theraband;10 reps    Theraband Level (Shoulder Extension)  Level 3 (Green)    Row  [employer] reps    Theraband Level (Shoulder Row)  Level 3 (Green)    Retraction  Theraband;10 reps    Theraband Level (Shoulder Retraction)  Level 3 Nyoka Cowden)             OT Education - 12/29/18 0910    Education Details  reviewed goals and current HEP.  Upgraded to green theraband for scapular strengthening.    Person(s) Educated  Patient    Methods  Explanation;Demonstration;Handout    Comprehension  Verbalized understanding;Returned demonstration       OT Short Term Goals - 12/28/18 1457      OT SHORT TERM GOAL #1   Title  Pt will be provided with HEP to improve mobility required for ADL completion using BUE.     Time  4    Period  Weeks    Status  Achieved    Target Date  12/06/18      OT SHORT TERM GOAL #2   Title  Pt will decrease pain in BUE to 5/10 to improve ability to perform dressing tasks with minimal compensatory movements.     Time  4    Period  Weeks    Status  Achieved      OT SHORT TERM GOAL #3   Title  Pt will increase BUE P/ROM to [facility] to improve ability to complete low level reach tasks required for dressing and bathing.     Time  4    Period  Weeks    Status  Achieved      OT SHORT TERM GOAL #4   Title  Pt will increase strength in BUE to 4-/5 to improve ability to complete reaching and lifting tasks below shoulder level.     Time  4    Period  Weeks    Status  Achieved        OT Long Term Goals - 12/28/18 1458      OT LONG TERM GOAL #1   Title  Pt will return  to highest level of functioning using BUE as dominant and non-dominant during ADL, work, and leisure tasks without need for compensatory strategies.     Time  8    Period  Weeks    Status  Achieved      OT LONG TERM GOAL #2   Title  Pt will decrease pain in BUE to 3/10 or less to improve ability to  sleep in preferred position.     Time  8    Period  Weeks    Status  Achieved      OT LONG TERM GOAL #3   Title  Pt will increase BUE A/ROM to [facility] to improve ability to reach up to fix hair and behind back for bathing/dressing tasks.     Time  8    Period  Weeks    Status  Achieved      OT LONG TERM GOAL #4   Title  Pt will decrease fascial restrictions in BUE to minimal amounts or less to improve mobility required for functional reaching tasks.     Time  8    Period  Weeks    Status  Achieved      OT LONG TERM GOAL #5   Title  Pt will improve strength in BUE to 4+/5 or greater to improve ability to perform work tasks in the allotted time.     Time  8    Period  Weeks    Status  Achieved            Plan - 12/29/18 0910    Clinical Impression Statement  A:  Patient has made significant progress towards her goals in occupational therapy.  Her a/rom is [facility] and equal.  Her strength is 5/5, and her pain level has decreased significantly.  Patient feels ready to dc skilled OT intervention and continue to work towards end range and sustained activity tolerance with an HEP.    Body Structure / Function / Physical Skills  ADL;Strength;Pain;Edema;UE functional use;IADL;ROM;Fascial restriction;Mobility    Plan  P:  DC from skilled OT intervention this date.       Patient will benefit from skilled therapeutic intervention in order to improve the following deficits and impairments:   Body Structure / Function / Physical Skills: ADL, Strength, Pain, Edema, UE functional use, IADL, ROM, Fascial restriction, Mobility       Visit Diagnosis: 1. Acute pain of right shoulder   2. Other symptoms and signs involving the musculoskeletal system   3. Acute pain of left shoulder       Problem List Patient Active Problem List   Diagnosis Date Noted  . Borderline diabetes 12/25/2018  . Hypertension 06/30/2018  . S/P abdominal supracervical subtotal hysterectomy 11/06/2016  .  Gastroesophageal reflux disease with esophagitis   . GERD (gastroesophageal reflux disease) 08/04/2014  . Shoulder strain 07/26/2014  . Degenerative arthritis of thumb 07/26/2014  . Left knee pain 02/28/2014  . Hip pain 04/15/2013  . Epicondylitis, lateral (tennis elbow) 04/13/2013  . Constipation 09/15/2012  . Hyperlipidemia 11/28/2011  . Obesity, Class I, BMI 30.0-34.9 (see actual BMI) 09/15/2011  . ALLERGIC RHINITIS, SEASONAL 11/13/2007    Vangie Bicker, [place], OTR/L [phone]  12/29/2018, 9:19 AM OCCUPATIONAL THERAPY DISCHARGE SUMMARY  Visits from Start of Care: 7 Current functional level related to goals / functional outcomes: Improved a/rom, strength, decreased pain, increased function with adls   Remaining deficits: Sustained activity tolerance   Education / Equipment: Scapular stability and sustained  activity tolerance with green theraband   Plan: Patient agrees to discharge.  Patient goals were met. Patient is being discharged due to meeting the stated rehab goals.  ?????     Vangie Bicker, [place], OTR/L [place] [address] [place], [place], [address] Phone: [phone]   Fax:  [phone]  Name: Jacqueline [name] MRN: [id number] Date of Birth: [birth year]

## 2018-12-30 ENCOUNTER — Encounter (HOSPITAL_COMMUNITY): Payer: BLUE CROSS/BLUE SHIELD | Admitting: Occupational Therapy

## 2019-01-01 ENCOUNTER — Other Ambulatory Visit: Payer: Self-pay | Admitting: *Deleted

## 2019-01-01 MED ORDER — ATORVASTATIN CALCIUM 20 MG PO TABS: 20.0000 mg | ORAL_TABLET | Freq: Every day | ORAL | 3 refills | Status: DC

## 2019-01-12 ENCOUNTER — Other Ambulatory Visit: Payer: Self-pay | Admitting: Obstetrics & Gynecology

## 2019-03-25 ENCOUNTER — Telehealth: Payer: Self-pay | Admitting: *Deleted

## 2019-03-25 NOTE — Telephone Encounter (Signed)
Received call from patient.   States that she would like a second opinion on her back. States that she has receive an injection into her spine with no relief ans she is scheduled for one on 03/26/2019. Reports that she was advised that if second shot did not help, treatment would not help her pain. States that she does not want to be a "pincushion" if the injections will not be beneficial.   Advised that second opinions take time as the new doctor will need review the previous notes and scans. States that she will wait until after injection tomorrow to make up her mind.   MD to be made aware.

## 2019-03-26 NOTE — Telephone Encounter (Signed)
Noted, pt can contact us, if she wants a new referral for 2nd opinion

## 2019-04-06 ENCOUNTER — Ambulatory Visit: Payer: Self-pay | Admitting: Family Medicine

## 2019-05-05 ENCOUNTER — Other Ambulatory Visit: Payer: BC Managed Care – PPO

## 2019-05-05 ENCOUNTER — Ambulatory Visit: Payer: Self-pay | Admitting: Family Medicine

## 2019-05-05 ENCOUNTER — Other Ambulatory Visit: Payer: Self-pay

## 2019-05-05 DIAGNOSIS — R7303 Prediabetes: Secondary | ICD-10-CM

## 2019-05-05 DIAGNOSIS — I10 Essential (primary) hypertension: Secondary | ICD-10-CM

## 2019-05-05 DIAGNOSIS — E782 Mixed hyperlipidemia: Secondary | ICD-10-CM

## 2019-05-06 LAB — COMPREHENSIVE METABOLIC PANEL
AG Ratio: 1.5 (calc) (ref 1.0–2.5)
ALT: 14 U/L (ref 6–29)
AST: 19 U/L (ref 10–35)
Albumin: 4.1 g/dL (ref 3.6–5.1)
Alkaline phosphatase (APISO): 91 U/L (ref 37–153)
BUN: 17 mg/dL (ref 7–25)
CO2: 26 mmol/L (ref 20–32)
Calcium: 9.3 mg/dL (ref 8.6–10.4)
Chloride: 105 mmol/L (ref 98–110)
Creat: 0.92 mg/dL (ref 0.50–1.05)
Globulin: 2.8 g/dL (calc) (ref 1.9–3.7)
Glucose, Bld: 84 mg/dL (ref 65–99)
Potassium: 4.2 mmol/L (ref 3.5–5.3)
Sodium: 140 mmol/L (ref 135–146)
Total Bilirubin: 0.7 mg/dL (ref 0.2–1.2)
Total Protein: 6.9 g/dL (ref 6.1–8.1)

## 2019-05-06 LAB — CBC WITH DIFFERENTIAL/PLATELET
Absolute Monocytes: 468 cells/uL (ref 200–950)
Basophils Absolute: 41 cells/uL (ref 0–200)
Basophils Relative: 0.9 %
Eosinophils Absolute: 122 cells/uL (ref 15–500)
Eosinophils Relative: 2.7 %
HCT: 36.8 % (ref 35.0–45.0)
Hemoglobin: 12.1 g/dL (ref 11.7–15.5)
Lymphs Abs: 1728 cells/uL (ref 850–3900)
MCH: 30.5 pg (ref 27.0–33.0)
MCHC: 32.9 g/dL (ref 32.0–36.0)
MCV: 92.7 fL (ref 80.0–100.0)
MPV: 12.2 fL (ref 7.5–12.5)
Monocytes Relative: 10.4 %
Neutro Abs: 2142 cells/uL (ref 1500–7800)
Neutrophils Relative %: 47.6 %
Platelets: 194 10*3/uL (ref 140–400)
RBC: 3.97 10*6/uL (ref 3.80–5.10)
RDW: 13.1 % (ref 11.0–15.0)
Total Lymphocyte: 38.4 %
WBC: 4.5 10*3/uL (ref 3.8–10.8)

## 2019-05-06 LAB — HEMOGLOBIN A1C
Hgb A1c MFr Bld: 6.1 % of total Hgb — ABNORMAL HIGH (ref <5.7)
Mean Plasma Glucose: 128 (calc)
eAG (mmol/L): 7.1 (calc)

## 2019-05-06 LAB — LIPID PANEL
Cholesterol: 156 mg/dL (ref <200)
HDL: 53 mg/dL (ref >=50)
LDL Cholesterol (Calc): 87 mg/dL (calc)
Non-HDL Cholesterol (Calc): 103 mg/dL (calc) (ref <130)
Total CHOL/HDL Ratio: 2.9 (calc) (ref <5.0)
Triglycerides: 73 mg/dL (ref <150)

## 2019-05-11 ENCOUNTER — Other Ambulatory Visit: Payer: Self-pay | Admitting: Family Medicine

## 2019-05-14 ENCOUNTER — Other Ambulatory Visit (HOSPITAL_COMMUNITY): Payer: Self-pay | Admitting: Family Medicine

## 2019-05-14 DIAGNOSIS — Z1231 Encounter for screening mammogram for malignant neoplasm of breast: Secondary | ICD-10-CM

## 2019-05-18 ENCOUNTER — Encounter: Payer: Self-pay | Admitting: Family Medicine

## 2019-05-18 ENCOUNTER — Ambulatory Visit: Payer: BC Managed Care – PPO | Admitting: Family Medicine

## 2019-05-18 VITALS — BP 112/68 | HR 100 | Temp 98.7°F | Resp 14 | Ht 62.5 in | Wt 186.0 lb

## 2019-05-18 DIAGNOSIS — E782 Mixed hyperlipidemia: Secondary | ICD-10-CM | POA: Diagnosis not present

## 2019-05-18 DIAGNOSIS — E669 Obesity, unspecified: Secondary | ICD-10-CM | POA: Diagnosis not present

## 2019-05-18 DIAGNOSIS — L309 Dermatitis, unspecified: Secondary | ICD-10-CM

## 2019-05-18 DIAGNOSIS — R7303 Prediabetes: Secondary | ICD-10-CM | POA: Diagnosis not present

## 2019-05-18 DIAGNOSIS — K219 Gastro-esophageal reflux disease without esophagitis: Secondary | ICD-10-CM

## 2019-05-18 DIAGNOSIS — I10 Essential (primary) hypertension: Secondary | ICD-10-CM

## 2019-05-18 MED ORDER — ESOMEPRAZOLE MAGNESIUM 40 MG PO CPDR: 40.0000 mg | DELAYED_RELEASE_CAPSULE | Freq: Every day | ORAL | 3 refills | Status: DC

## 2019-05-18 MED ORDER — UREA 10 % EX CREA: TOPICAL_CREAM | Freq: Every day | CUTANEOUS | 0 refills | Status: DC

## 2019-05-18 NOTE — Progress Notes (Signed)
Subjective:    Patient ID: Jacqueline [name], female    DOB: 1961/03/26, 58 y.o.   MRN: [id number]  Patient presents for Reflux (reflux- acid in throat at night- has been using tums and pepto)    Pt here to f/u chronic medical problems   Hypertension she is taking her blood pressure medicine as prescribed she had recent fasting labs.  Which were reviewed at the bedside.  Hyperlipidemia she is taking her statin drug her cholesterol is much improved    Acid reflux has been worsening over the past few weeks. She ate at a [employer] and had severe reflux and emesis after eating hot dog  and discomfort in his chest. She has been using tums. She did try a nexium and that helped.A few nights couldn't sleep due to the sensation in her chest.   Last night had chocolate ice cream/ cake and had emesis and acid taste in her mouth      She has had some hard stools on occasion, if you have looked dark but no gross blood,  He is still following with Dr. Ronalee Red from her [employer]. case she had recent injections put in her back to help "kill the nerves, she is on gabapentin   She would like to try different cream for the peeling skin on her feet the clotrimazole did not work as well. Review Of Systems:  GEN- denies fatigue, fever, weight loss,weakness, recent illness HEENT- denies eye drainage, change in vision, nasal discharge, CVS- denies chest pain, palpitations RESP- denies SOB, cough, wheeze ABD- +N/V, change in stools, abd pain GU- denies dysuria, hematuria, dribbling, incontinence MSK- denies joint pain, muscle aches, injury Neuro- denies headache, dizziness, syncope, seizure activity       Objective:    BP 112/68   Pulse 100   Temp 98.7 F (37.1 C) (Temporal)   Resp 14   Ht 5' 2.5" (1.588 m)   Wt 186 lb (84.4 kg)   LMP 10/26/2016 (Exact Date)   SpO2 96%   BMI 33.48 kg/m  GEN- NAD, alert and oriented x3 HEENT- PERRL, EOMI, non injected sclera, pink conjunctiva, MMM,  oropharynx clear Neck- Supple, no thyromegaly CVS- RRR, no murmur RESP-CTAB ABD-NABS,soft,NT,ND Skin- bilat feet, peeling skin on soles, no erythema, no maceration between toes EXT- No edema Pulses- Radial, DP- 2+        Assessment & Plan:      Problem List Items Addressed This Visit      Unprioritized   Borderline diabetes   GERD (gastroesophageal reflux disease) - Primary   Relevant Medications   esomeprazole (NEXIUM) 40 MG capsule   Hyperlipidemia   Hypertension    Blood pressure is controlled no change in medication.  Reviewed her recent fasting labs which were unremarkable with the exception of her borderline diabetes A1c is at 6.1% Gust some dietary changes such as cutting down on her soda intake increasing her water.  Also cutting out fast food fried foods which are also contributing to her reflux symptoms.  Reflux I Minna start her on Nexium 40 mg once a day.  She was actually seen by GI years ago for reflux symptoms.      Obesity, Class I, BMI 30.0-34.9 (see actual BMI)    Other Visit Diagnoses    Foot dermatitis       Trial of urrea cream      Note: This dictation was prepared with Dragon dictation along with smaller phrase technology. Any transcriptional errors  that result from this process are unintentional.

## 2019-05-18 NOTE — Assessment & Plan Note (Addendum)
Blood pressure is controlled no change in medication.  Reviewed her recent fasting labs which were unremarkable with the exception of her borderline diabetes A1c is at 6.1% Gust some dietary changes such as cutting down on her soda intake increasing her water.  Also cutting out fast food fried foods which are also contributing to her reflux symptoms.  Reflux I Minna start her on Nexium 40 mg once a day.  She was actually seen by GI years ago for reflux symptoms.

## 2019-05-18 NOTE — Patient Instructions (Addendum)
Try the CeraVe/ Aveeno/ Eucerin F/U as previous

## 2019-05-26 IMAGING — DX RIGHT SHOULDER - 2+ VIEW
1 series · 1 of 1 positions shown · non-contrast
Comparison: None.

CLINICAL DATA: Bilateral shoulder pain for 1 month.  No injury.

EXAM:
RIGHT SHOULDER - 2+ VIEW

[shoulder axillary]
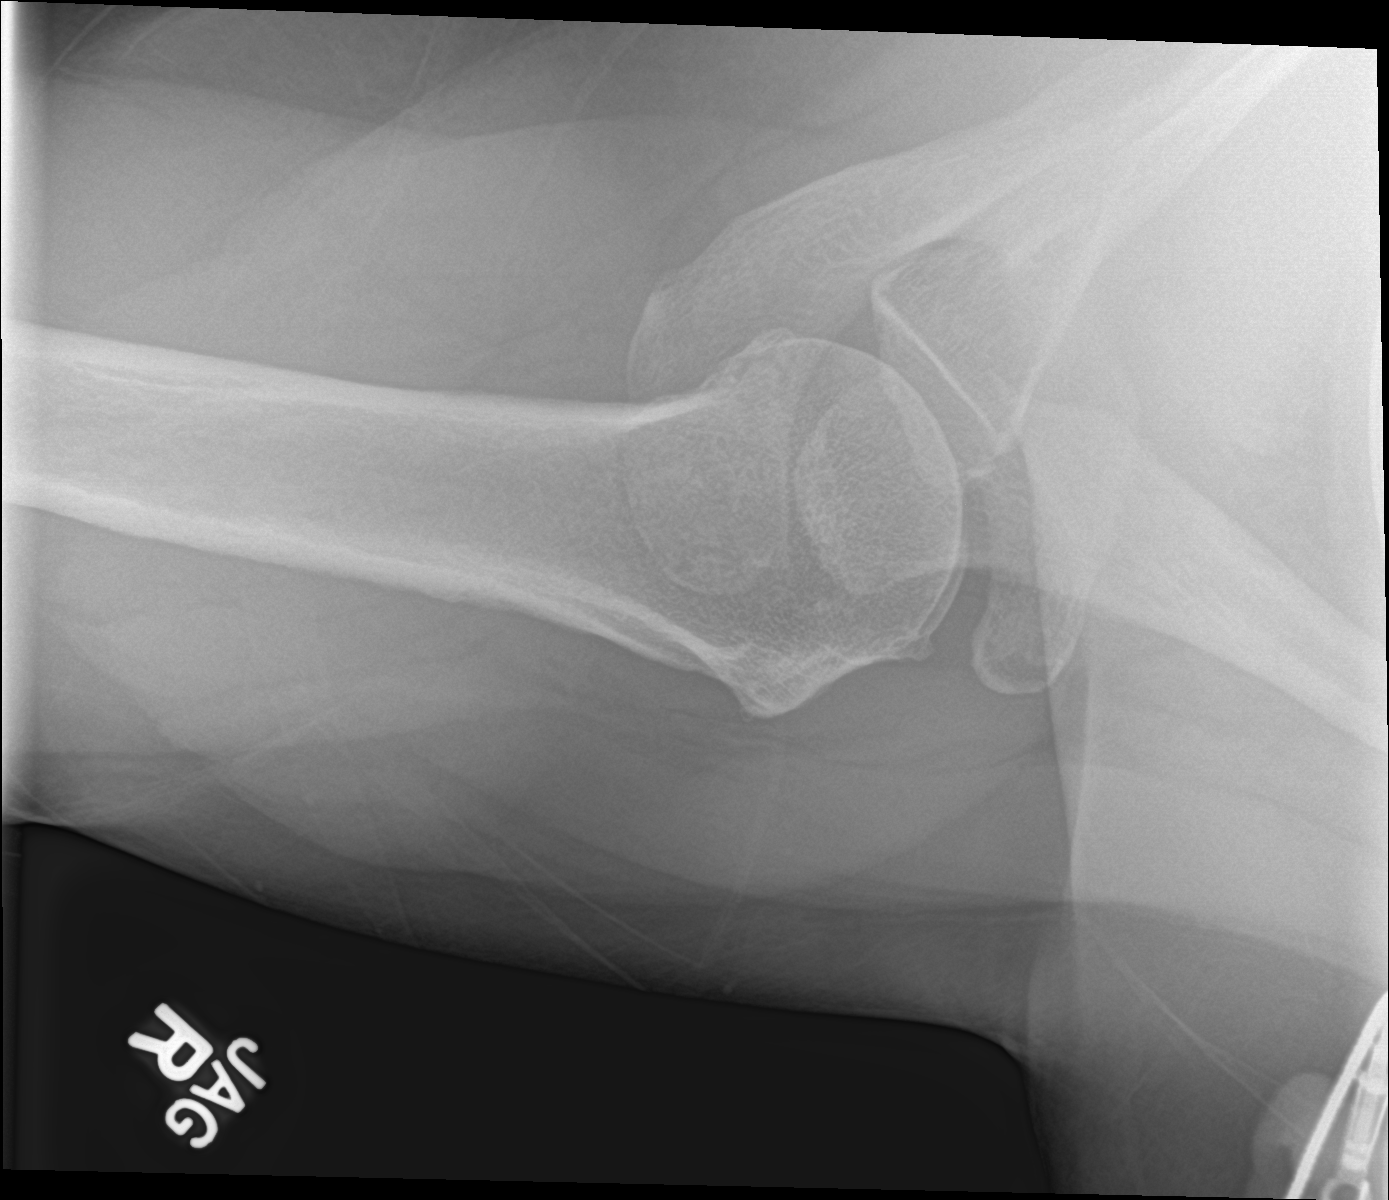

[1 of 1 positions shown; findings below may reference images not displayed]

FINDINGS: There is no evidence of fracture or dislocation. There is no
evidence of arthropathy or other focal bone abnormality. Soft
tissues are unremarkable.
IMPRESSION: Negative.

## 2019-06-07 ENCOUNTER — Ambulatory Visit (HOSPITAL_COMMUNITY): Payer: BC Managed Care – PPO

## 2019-06-09 ENCOUNTER — Encounter: Payer: BC Managed Care – PPO | Admitting: Family Medicine

## 2019-06-16 ENCOUNTER — Ambulatory Visit (INDEPENDENT_AMBULATORY_CARE_PROVIDER_SITE_OTHER): Payer: BC Managed Care – PPO | Admitting: Family Medicine

## 2019-06-16 ENCOUNTER — Encounter: Payer: Self-pay | Admitting: Family Medicine

## 2019-06-16 ENCOUNTER — Other Ambulatory Visit: Payer: Self-pay

## 2019-06-16 VITALS — BP 128/68 | HR 84 | Temp 98.7°F | Resp 14 | Ht 62.5 in | Wt 185.0 lb

## 2019-06-16 DIAGNOSIS — M25561 Pain in right knee: Secondary | ICD-10-CM | POA: Diagnosis not present

## 2019-06-16 NOTE — Patient Instructions (Addendum)
Get xray of right knee- [facility]  We will call with results Cancel next in January F/U 3 months

## 2019-06-16 NOTE — Progress Notes (Signed)
   Subjective:    Patient ID: Jacqueline [name], female    DOB: 26-Jan-1961, 59 y.o.   MRN: [id number]  Patient presents for Knee Pain (x1 month- R knee pain and swelling- can't put full weight on it)  Sheis going to have back surgery within the next 1-2 months.  Right knee pain for the past month, sometimes feels like knee is going to give out, also feels like it stays swollen. Pain is mostly on the medial aspect.  No known injury. She has chronic back pain which also radiates down her legs, so not sure if this is cause of discomfort. She often has pain in back and knees when standing or sitting, getting into car.  No recent falls    Review Of Systems:  GEN- denies fatigue, fever, weight loss,weakness, recent illness HEENT- denies eye drainage, change in vision, nasal discharge, CVS- denies chest pain, palpitations RESP- denies SOB, cough, wheeze ABD- denies N/V, change in stools, abd pain GU- denies dysuria, hematuria, dribbling, incontinence MSK- +joint pain, muscle aches, injury Neuro- denies headache, dizziness, syncope, seizure activity       Objective:    BP 128/68   Pulse 84   Temp 98.7 F (37.1 C) (Temporal)   Resp 14   Ht 5' 2.5" (1.588 m)   Wt 185 lb (83.9 kg)   LMP 10/26/2016 (Exact Date)   SpO2 98%   BMI 33.30 kg/m  GEN- NAD, alert and oriented x3 MSK- bilat knee normal apperance, no effusion, no warmth, fair ROM, TTP medially, liagments grossly in tact Antalgic gait (from back) ,left knee FROM , hips fair ROM bilat   EXT- No edema Pulses- Radial, DP- 2+        Assessment & Plan:      Problem List Items Addressed This Visit    None    Visit Diagnoses    Acute pain of right knee    -  Primary   Difficult to tell if acute knee problem or alignment issues due to her back pain and need for back surgery. obtain xray of right knee if negative or minimal disease I think getting her back surgery may improve her knee discomfort in general.  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  She can continue her meloxicam as needed for pain there is no effusion or sign of infection on the knee   Relevant Orders   DG Knee Complete 4 Views Right      Note: This dictation was prepared with Dragon dictation along with smaller phrase technology. Any transcriptional errors that result from this process are unintentional.

## 2019-06-17 ENCOUNTER — Encounter (HOSPITAL_COMMUNITY): Payer: Self-pay

## 2019-06-17 ENCOUNTER — Ambulatory Visit (HOSPITAL_COMMUNITY)
Admission: RE | Admit: 2019-06-17 | Discharge: 2019-06-17 | Disposition: A | Discharge status: Home / Self Care | Payer: BC Managed Care – PPO | Source: Ambulatory Visit | Attending: Family Medicine | Admitting: Family Medicine

## 2019-06-17 DIAGNOSIS — M25561 Pain in right knee: Secondary | ICD-10-CM

## 2019-06-21 ENCOUNTER — Other Ambulatory Visit: Payer: Self-pay

## 2019-06-21 ENCOUNTER — Ambulatory Visit (HOSPITAL_COMMUNITY)
Admission: RE | Admit: 2019-06-21 | Discharge: 2019-06-21 | Disposition: A | Discharge status: Home / Self Care | Payer: BC Managed Care – PPO | Source: Ambulatory Visit | Attending: Family Medicine | Admitting: Family Medicine

## 2019-06-21 DIAGNOSIS — Z1231 Encounter for screening mammogram for malignant neoplasm of breast: Secondary | ICD-10-CM

## 2019-06-28 ENCOUNTER — Ambulatory Visit: Payer: BC Managed Care – PPO | Admitting: Family Medicine

## 2019-07-13 ENCOUNTER — Other Ambulatory Visit: Payer: Self-pay | Admitting: Orthopedic Surgery

## 2019-07-23 NOTE — Pre-Procedure Instructions (Signed)
[place], [place] - [id number] [place] [id number] [place] [address] [place] [address] Phone: [phone] Fax: [phone]      Your procedure is scheduled on Wednesday, February 17th.  Report to [facility] Main Entrance "A" at 6:30 A.M., and check in at the Admitting office.  Call this number if you have problems the morning of surgery:  [phone]  Call [phone] if you have any questions prior to your surgery date Monday-Friday 8am-4pm    Remember:  Do not eat after midnight the night before your surgery  You may drink clear liquids until 5:30 the morning of your surgery.   Clear liquids allowed are: Water, Non-Citrus Juices (without pulp), Carbonated Beverages, Clear Tea, Black Coffee Only, and Gatorade.   Enhanced Recovery after Surgery for Orthopedics Enhanced Recovery after Surgery is a protocol used to improve the stress on your body and your recovery after surgery.  Patient Instructions  . The night before surgery:  o No food after midnight. ONLY clear liquids after midnight  .  Marland Kitchen The day of surgery (if you do NOT have diabetes):  o Drink ONE (1) Pre-Surgery Clear Ensure as directed.   o This drink was given to you during your hospital  pre-op appointment visit. o The pre-op nurse will instruct you on the time to drink the  Pre-Surgery Ensure depending on your surgery time. o Finish the drink at the designated time by the pre-op nurse.  o Nothing else to drink after completing the  Pre-Surgery Clear Ensure.   Please complete your PRE-SURGERY ENSURE that was provided to you by 5:30 the morning of surgery.  Please, if able, drink it in one setting. DO NOT SIP.      Take these medicines the morning of surgery with A SIP OF WATER  amLODipine (NORVASC) esomeprazole (NEXIUM) fluticasone (FLONASE)-as needed   As of today, STOP taking any Aspirin (unless otherwise instructed by your surgeon), Aleve, Naproxen, Ibuprofen, Motrin, Advil,  Goody's, BC's, all herbal medications, fish oil, and all vitamins. This includes: meloxicam (MOBIC).     The Morning of Surgery  Do not wear jewelry, make-up or nail polish.  Do not wear lotions, powders, or perfumes, or deodorant  Do not shave 48 hours prior to surgery.    Do not bring valuables to the hospital.  [facility] is not responsible for any belongings or valuables.  If you are a smoker, DO NOT Smoke 24 hours prior to surgery  If you wear a CPAP at night please bring your mask the morning of surgery   Remember that you must have someone to transport you home after your surgery, and remain with you for 24 hours if you are discharged the same day.   Please bring cases for contacts, glasses, hearing aids, dentures or bridgework because it cannot be worn into surgery.    Leave your suitcase in the car.  After surgery it may be brought to your room.  For patients admitted to the hospital, discharge time will be determined by your treatment team.  Patients discharged the day of surgery will not be allowed to drive home.    Special instructions:   [facility]- Preparing For Surgery  Before surgery, you can play an important role. Because skin is not sterile, your skin needs to be as free of germs as possible. You can reduce the number of germs on your skin by washing with CHG (chlorahexidine gluconate) Soap before surgery.  CHG is an  antiseptic cleaner which kills germs and bonds with the skin to continue killing germs even after washing.    Oral Hygiene is also important to reduce your risk of infection.  Remember - BRUSH YOUR TEETH THE MORNING OF SURGERY WITH YOUR REGULAR TOOTHPASTE  Please do not use if you have an allergy to CHG or antibacterial soaps. If your skin becomes reddened/irritated stop using the CHG.  Do not shave (including legs and underarms) for at least 48 hours prior to first CHG shower. It is OK to shave your face.  Please follow these instructions  carefully.   1. Shower the NIGHT BEFORE SURGERY and the MORNING OF SURGERY with CHG Soap.   2. If you chose to wash your hair, wash your hair first as usual with your normal shampoo.  3. After you shampoo, rinse your hair and body thoroughly to remove the shampoo.  4. Use CHG as you would any other liquid soap. You can apply CHG directly to the skin and wash gently with a scrungie or a clean washcloth.   5. Apply the CHG Soap to your body ONLY FROM THE NECK DOWN.  Do not use on open wounds or open sores. Avoid contact with your eyes, ears, mouth and genitals (private parts). Wash Face and genitals (private parts)  with your normal soap.   6. Wash thoroughly, paying special attention to the area where your surgery will be performed.  7. Thoroughly rinse your body with warm water from the neck down.  8. DO NOT shower/wash with your normal soap after using and rinsing off the CHG Soap.  9. Pat yourself dry with a CLEAN TOWEL.  10. Wear CLEAN PAJAMAS to bed the night before surgery, wear comfortable clothes the morning of surgery  11. Place CLEAN SHEETS on your bed the night of your first shower and DO NOT SLEEP WITH PETS.    Day of Surgery:  Please shower the morning of surgery with the CHG soap Do not apply any deodorants/lotions. Please wear clean clothes to the hospital/surgery center.   Remember to brush your teeth WITH YOUR REGULAR TOOTHPASTE.   Please read over the following fact sheets that you were given.

## 2019-07-24 ENCOUNTER — Other Ambulatory Visit: Payer: Self-pay | Admitting: Family Medicine

## 2019-07-26 ENCOUNTER — Other Ambulatory Visit: Payer: Self-pay

## 2019-07-26 ENCOUNTER — Other Ambulatory Visit (HOSPITAL_COMMUNITY)
Admission: RE | Admit: 2019-07-26 | Discharge: 2019-07-26 | Disposition: A | Discharge status: Home / Self Care | Source: Ambulatory Visit | Attending: Orthopedic Surgery | Admitting: Orthopedic Surgery

## 2019-07-26 ENCOUNTER — Encounter (HOSPITAL_COMMUNITY): Payer: Self-pay

## 2019-07-26 ENCOUNTER — Encounter (HOSPITAL_COMMUNITY)
Admission: RE | Admit: 2019-07-26 | Discharge: 2019-07-26 | Disposition: A | Discharge status: Home / Self Care | Source: Ambulatory Visit | Attending: Orthopedic Surgery | Admitting: Orthopedic Surgery

## 2019-07-26 DIAGNOSIS — Z01818 Encounter for other preprocedural examination: Secondary | ICD-10-CM | POA: Insufficient documentation

## 2019-07-26 HISTORY — DX: Unspecified osteoarthritis, unspecified site: M19.90

## 2019-07-26 HISTORY — DX: Anesthesia of skin: R20.0

## 2019-07-26 HISTORY — DX: Essential (primary) hypertension: I10

## 2019-07-26 HISTORY — DX: Anesthesia of skin: R20.2

## 2019-07-26 LAB — CBC WITH DIFFERENTIAL/PLATELET
Abs Immature Granulocytes: 0.01 10*3/uL (ref 0.00–0.07)
Basophils Absolute: 0 10*3/uL (ref 0.0–0.1)
Basophils Relative: 1 %
Eosinophils Absolute: 0.1 10*3/uL (ref 0.0–0.5)
Eosinophils Relative: 3 %
HCT: 38.1 % (ref 36.0–46.0)
Hemoglobin: 12.5 g/dL (ref 12.0–15.0)
Immature Granulocytes: 0 %
Lymphocytes Relative: 40 %
Lymphs Abs: 2.1 10*3/uL (ref 0.7–4.0)
MCH: 30.9 pg (ref 26.0–34.0)
MCHC: 32.8 g/dL (ref 30.0–36.0)
MCV: 94.3 fL (ref 80.0–100.0)
Monocytes Absolute: 0.6 10*3/uL (ref 0.1–1.0)
Monocytes Relative: 11 %
Neutro Abs: 2.4 10*3/uL (ref 1.7–7.7)
Neutrophils Relative %: 45 %
Platelets: 223 10*3/uL (ref 150–400)
RBC: 4.04 MIL/uL (ref 3.87–5.11)
RDW: 13.2 % (ref 11.5–15.5)
WBC: 5.3 10*3/uL (ref 4.0–10.5)
nRBC: 0 % (ref 0.0–0.2)

## 2019-07-26 LAB — COMPREHENSIVE METABOLIC PANEL
ALT: 21 U/L (ref 0–44)
AST: 27 U/L (ref 15–41)
Albumin: 3.9 g/dL (ref 3.5–5.0)
Alkaline Phosphatase: 104 U/L (ref 38–126)
Anion gap: 9 (ref 5–15)
BUN: 11 mg/dL (ref 6–20)
CO2: 25 mmol/L (ref 22–32)
Calcium: 9.5 mg/dL (ref 8.9–10.3)
Chloride: 106 mmol/L (ref 98–111)
Creatinine, Ser: 1.09 mg/dL — ABNORMAL HIGH (ref 0.44–1.00)
GFR calc Af Amer: >60 mL/min (ref >60)
GFR calc non Af Amer: 56 mL/min — ABNORMAL LOW (ref >60)
Glucose, Bld: 100 mg/dL — ABNORMAL HIGH (ref 70–99)
Potassium: 3.9 mmol/L (ref 3.5–5.1)
Sodium: 140 mmol/L (ref 135–145)
Total Bilirubin: 0.5 mg/dL (ref 0.3–1.2)
Total Protein: 7.1 g/dL (ref 6.5–8.1)

## 2019-07-26 LAB — URINALYSIS, ROUTINE W REFLEX MICROSCOPIC
Bilirubin Urine: NEGATIVE
Glucose, UA: NEGATIVE mg/dL
Hgb urine dipstick: NEGATIVE
Ketones, ur: NEGATIVE mg/dL
Leukocytes,Ua: NEGATIVE
Nitrite: NEGATIVE
Protein, ur: NEGATIVE mg/dL
Specific Gravity, Urine: 1.017 (ref 1.005–1.030)
pH: 6 (ref 5.0–8.0)

## 2019-07-26 LAB — PROTIME-INR
INR: 1.2 (ref 0.8–1.2)
Prothrombin Time: 14.6 seconds (ref 11.4–15.2)

## 2019-07-26 LAB — TYPE AND SCREEN
ABO/RH(D): O POS
Antibody Screen: NEGATIVE

## 2019-07-26 LAB — HEMOGLOBIN A1C
Hgb A1c MFr Bld: 6.1 % — ABNORMAL HIGH (ref 4.8–5.6)
Mean Plasma Glucose: 128.37 mg/dL

## 2019-07-26 LAB — GLUCOSE, CAPILLARY: Glucose-Capillary: 106 mg/dL — ABNORMAL HIGH (ref 70–99)

## 2019-07-26 LAB — SURGICAL PCR SCREEN
MRSA, PCR: NEGATIVE
Staphylococcus aureus: POSITIVE — AB

## 2019-07-26 LAB — APTT: aPTT: 33 seconds (ref 24–36)

## 2019-07-26 LAB — ABO/RH: ABO/RH(D): O POS

## 2019-07-26 LAB — SARS CORONAVIRUS 2 (TAT 6-24 HRS): SARS Coronavirus 2: NEGATIVE

## 2019-07-26 NOTE — Progress Notes (Signed)
PCP - Dr. Vic Blackbird Cardiologist - denies  PPM/ICD - denies  Chest x-ray - N/A EKG - 07/26/2019 Stress Test - 2013 ECHO - 2013 Cardiac Cath - denies  Sleep Study - denies CPAP - N/A  Blood Thinner Instructions: N/A Aspirin Instructions: N/A  ERAS Protcol - Yes PRE-SURGERY Ensure or G2- (1) 10oz bottle of water given.  COVID TEST- Scheduled today 07/26/2019. Patient verbalized understanding of self-quarantine instructions, appointment time and place.  Anesthesia review: No  Patient denies shortness of breath, fever, cough and chest pain at PAT appointment  All instructions explained to the patient, with a verbal understanding of the material. Patient agrees to go over the instructions while at home for a better understanding. Patient also instructed to self quarantine after being tested for COVID-19. The opportunity to ask questions was provided.

## 2019-07-26 NOTE — Pre-Procedure Instructions (Signed)
[place], [place] - [id number] [place] [id number] [place] [address] [place] [address] Phone: [phone] Fax: [phone]    Your procedure is scheduled on Wednesday, February 17th.  Report to [facility] Main Entrance "A" at 6:30 A.M., and check in at the Admitting office.  Call this number if you have problems the morning of surgery:  [phone]  Call [phone] if you have any questions prior to your surgery date Monday-Friday 8am-4pm   Remember:  Do not eat after midnight the night before your surgery  You may drink clear liquids until 5:30 the morning of your surgery.   Clear liquids allowed are: Water, Non-Citrus Juices (without pulp), Carbonated Beverages, Clear Tea, Black Coffee Only, and Gatorade.   Enhanced Recovery after Surgery for Orthopedics Enhanced Recovery after Surgery is a protocol used to improve the stress on your body and your recovery after surgery.  Patient Instructions  . The night before surgery:  o No food after midnight. ONLY clear liquids after midnight  . The day of surgery (if you have diabetes): o  o Drink ONE (1) 10oz bottle of water as directed. o This drink was given to you during your hospital  pre-op appointment visit.  o Drink the bottle of water 5:30 A.M. the morning of surgery o Nothing else to drink after completing the  10oz bottle of water         If you have questions, please contact your surgeon's office.    Take these medicines the morning of surgery with A SIP OF WATER  amLODipine (NORVASC) esomeprazole (NEXIUM) fluticasone (FLONASE)-as needed  As of today, STOP taking any Aspirin (unless otherwise instructed by your surgeon), Aleve, Naproxen, Ibuprofen, Motrin, Advil, Goody's, BC's, all herbal medications, fish oil, and all vitamins. This includes: meloxicam (MOBIC).    The Morning of Surgery  Do not wear jewelry, make-up or nail polish.  Do not wear lotions, powders, or perfumes, or deodorant  Do  not shave 48 hours prior to surgery.    Do not bring valuables to the hospital.  [facility] is not responsible for any belongings or valuables.  If you are a smoker, DO NOT Smoke 24 hours prior to surgery  If you wear a CPAP at night please bring your mask the morning of surgery   Remember that you must have someone to transport you home after your surgery, and remain with you for 24 hours if you are discharged the same day.  Please bring cases for contacts, glasses, hearing aids, dentures or bridgework because it cannot be worn into surgery.   Leave your suitcase in the car.  After surgery it may be brought to your room.  For patients admitted to the hospital, discharge time will be determined by your treatment team.  Patients discharged the day of surgery will not be allowed to drive home.   Special instructions:   [place] [facility]- Preparing For Surgery  Before surgery, you can play an important role. Because skin is not sterile, your skin needs to be as free of germs as possible. You can reduce the number of germs on your skin by washing with CHG (chlorahexidine gluconate) Soap before surgery.  CHG is an antiseptic cleaner which kills germs and bonds with the skin to continue killing germs even after washing.    Oral Hygiene is also important to reduce your risk of infection.  Remember - BRUSH YOUR TEETH THE MORNING OF SURGERY WITH YOUR REGULAR TOOTHPASTE  Please do  not use if you have an allergy to CHG or antibacterial soaps. If your skin becomes reddened/irritated stop using the CHG.  Do not shave (including legs and underarms) for at least 48 hours prior to first CHG shower. It is OK to shave your face.  Please follow these instructions carefully.   1. Shower the NIGHT BEFORE SURGERY and the MORNING OF SURGERY with CHG Soap.   2. If you chose to wash your hair, wash your hair first as usual with your normal shampoo.  3. After you shampoo, rinse your hair and body thoroughly to  remove the shampoo.  4. Use CHG as you would any other liquid soap. You can apply CHG directly to the skin and wash gently with a scrungie or a clean washcloth.   5. Apply the CHG Soap to your body ONLY FROM THE NECK DOWN.  Do not use on open wounds or open sores. Avoid contact with your eyes, ears, mouth and genitals (private parts). Wash Face and genitals (private parts)  with your normal soap.   6. Wash thoroughly, paying special attention to the area where your surgery will be performed.  7. Thoroughly rinse your body with warm water from the neck down.  8. DO NOT shower/wash with your normal soap after using and rinsing off the CHG Soap.  9. Pat yourself dry with a CLEAN TOWEL.  10. Wear CLEAN PAJAMAS to bed the night before surgery, wear comfortable clothes the morning of surgery  11. Place CLEAN SHEETS on your bed the night of your first shower and DO NOT SLEEP WITH PETS.  Day of Surgery: Please shower the morning of surgery with the CHG soap Do not apply any deodorants/lotions. Please wear clean clothes to the hospital/surgery center.   Remember to brush your teeth WITH YOUR REGULAR TOOTHPASTE.  Please read over the following fact sheets that you were given.

## 2019-07-27 ENCOUNTER — Other Ambulatory Visit: Payer: Self-pay | Admitting: Family Medicine

## 2019-07-28 ENCOUNTER — Inpatient Hospital Stay (HOSPITAL_COMMUNITY): Admitting: Certified Registered"

## 2019-07-28 ENCOUNTER — Inpatient Hospital Stay (HOSPITAL_COMMUNITY)

## 2019-07-28 ENCOUNTER — Other Ambulatory Visit: Payer: Self-pay

## 2019-07-28 ENCOUNTER — Encounter (HOSPITAL_COMMUNITY)
Admission: RE | Disposition: A | Discharge status: Home / Self Care | Payer: Self-pay | Source: Ambulatory Visit | Attending: Orthopedic Surgery

## 2019-07-28 ENCOUNTER — Inpatient Hospital Stay (HOSPITAL_COMMUNITY)
Admission: RE | Admit: 2019-07-28 | Discharge: 2019-07-29 | DRG: 455 | Disposition: A | Discharge status: Home Health | Attending: Orthopedic Surgery | Admitting: Orthopedic Surgery

## 2019-07-28 ENCOUNTER — Encounter (HOSPITAL_COMMUNITY): Payer: Self-pay | Admitting: Orthopedic Surgery

## 2019-07-28 DIAGNOSIS — Z90722 Acquired absence of ovaries, bilateral: Secondary | ICD-10-CM | POA: Diagnosis not present

## 2019-07-28 DIAGNOSIS — K219 Gastro-esophageal reflux disease without esophagitis: Secondary | ICD-10-CM | POA: Diagnosis present

## 2019-07-28 DIAGNOSIS — M4316 Spondylolisthesis, lumbar region: Secondary | ICD-10-CM | POA: Diagnosis present

## 2019-07-28 DIAGNOSIS — Z79899 Other long term (current) drug therapy: Secondary | ICD-10-CM | POA: Diagnosis not present

## 2019-07-28 DIAGNOSIS — Z419 Encounter for procedure for purposes other than remedying health state, unspecified: Secondary | ICD-10-CM

## 2019-07-28 DIAGNOSIS — Z90711 Acquired absence of uterus with remaining cervical stump: Secondary | ICD-10-CM | POA: Diagnosis not present

## 2019-07-28 DIAGNOSIS — Z9049 Acquired absence of other specified parts of digestive tract: Secondary | ICD-10-CM

## 2019-07-28 DIAGNOSIS — Z809 Family history of malignant neoplasm, unspecified: Secondary | ICD-10-CM | POA: Diagnosis not present

## 2019-07-28 DIAGNOSIS — M5116 Intervertebral disc disorders with radiculopathy, lumbar region: Secondary | ICD-10-CM | POA: Diagnosis present

## 2019-07-28 DIAGNOSIS — E882 Lipomatosis, not elsewhere classified: Secondary | ICD-10-CM | POA: Diagnosis present

## 2019-07-28 DIAGNOSIS — M48062 Spinal stenosis, lumbar region with neurogenic claudication: Secondary | ICD-10-CM | POA: Diagnosis present

## 2019-07-28 DIAGNOSIS — Z20822 Contact with and (suspected) exposure to covid-19: Secondary | ICD-10-CM | POA: Diagnosis present

## 2019-07-28 DIAGNOSIS — Z8249 Family history of ischemic heart disease and other diseases of the circulatory system: Secondary | ICD-10-CM

## 2019-07-28 DIAGNOSIS — I10 Essential (primary) hypertension: Secondary | ICD-10-CM | POA: Diagnosis present

## 2019-07-28 DIAGNOSIS — E785 Hyperlipidemia, unspecified: Secondary | ICD-10-CM | POA: Diagnosis present

## 2019-07-28 DIAGNOSIS — R7303 Prediabetes: Secondary | ICD-10-CM | POA: Diagnosis present

## 2019-07-28 DIAGNOSIS — M5410 Radiculopathy, site unspecified: Secondary | ICD-10-CM | POA: Diagnosis present

## 2019-07-28 HISTORY — PX: TRANSFORAMINAL LUMBAR INTERBODY FUSION (TLIF) WITH PEDICLE SCREW FIXATION 2 LEVEL: SHX6142

## 2019-07-28 LAB — GLUCOSE, CAPILLARY
Glucose-Capillary: 100 mg/dL — ABNORMAL HIGH (ref 70–99)
Glucose-Capillary: 84 mg/dL (ref 70–99)

## 2019-07-28 SURGERY — TRANSFORAMINAL LUMBAR INTERBODY FUSION (TLIF) WITH PEDICLE SCREW FIXATION 2 LEVEL
Anesthesia: General | Laterality: Left

## 2019-07-28 MED ORDER — ROCURONIUM BROMIDE 10 MG/ML (PF) SYRINGE
PREFILLED_SYRINGE | INTRAVENOUS | Status: AC
  Filled 2019-07-28: qty 10

## 2019-07-28 MED ORDER — SODIUM CHLORIDE 0.9 % IV SOLN: 250.0000 mL | INTRAVENOUS | Status: DC

## 2019-07-28 MED ORDER — BISACODYL 5 MG PO TBEC: 5.0000 mg | DELAYED_RELEASE_TABLET | Freq: Every day | ORAL | Status: DC | PRN

## 2019-07-28 MED ORDER — PHENYLEPHRINE HCL (PRESSORS) 10 MG/ML IV SOLN
INTRAVENOUS | Status: AC
  Filled 2019-07-28: qty 1

## 2019-07-28 MED ORDER — PHENYLEPHRINE HCL-NACL 10-0.9 MG/250ML-% IV SOLN
INTRAVENOUS | Status: DC | PRN
  Administered 2019-07-28: 40 ug/min via INTRAVENOUS

## 2019-07-28 MED ORDER — ROCURONIUM BROMIDE 10 MG/ML (PF) SYRINGE
PREFILLED_SYRINGE | INTRAVENOUS | Status: DC | PRN
  Administered 2019-07-28: 50 mg via INTRAVENOUS
  Administered 2019-07-28: 100 mg via INTRAVENOUS

## 2019-07-28 MED ORDER — LACTATED RINGERS IV SOLN: INTRAVENOUS | Status: DC | PRN

## 2019-07-28 MED ORDER — PROMETHAZINE HCL 25 MG/ML IJ SOLN: 6.2500 mg | INTRAMUSCULAR | Status: DC | PRN

## 2019-07-28 MED ORDER — FENTANYL CITRATE (PF) 250 MCG/5ML IJ SOLN
INTRAMUSCULAR | Status: AC
  Filled 2019-07-28: qty 5

## 2019-07-28 MED ORDER — ONDANSETRON HCL 4 MG/2ML IJ SOLN: 4.0000 mg | Freq: Four times a day (QID) | INTRAMUSCULAR | Status: DC | PRN

## 2019-07-28 MED ORDER — PROPOFOL 1000 MG/100ML IV EMUL
INTRAVENOUS | Status: AC
  Filled 2019-07-28: qty 100

## 2019-07-28 MED ORDER — FLEET ENEMA 7-19 GM/118ML RE ENEM: 1.0000 | ENEMA | Freq: Once | RECTAL | Status: DC | PRN

## 2019-07-28 MED ORDER — SODIUM CHLORIDE 0.9% FLUSH: 3.0000 mL | INTRAVENOUS | Status: DC | PRN

## 2019-07-28 MED ORDER — ATORVASTATIN CALCIUM 10 MG PO TABS
20.0000 mg | ORAL_TABLET | Freq: Every day | ORAL | Status: DC
  Administered 2019-07-28: 20 mg via ORAL
  Filled 2019-07-28: qty 2

## 2019-07-28 MED ORDER — EPINEPHRINE PF 1 MG/ML IJ SOLN
INTRAMUSCULAR | Status: AC
  Filled 2019-07-28: qty 1

## 2019-07-28 MED ORDER — HEMOSTATIC AGENTS (NO CHARGE) OPTIME
TOPICAL | Status: DC | PRN
  Administered 2019-07-28: 1 via TOPICAL

## 2019-07-28 MED ORDER — SUCCINYLCHOLINE CHLORIDE 200 MG/10ML IV SOSY
PREFILLED_SYRINGE | INTRAVENOUS | Status: AC
  Filled 2019-07-28: qty 10

## 2019-07-28 MED ORDER — ALBUMIN HUMAN 5 % IV SOLN: INTRAVENOUS | Status: DC | PRN

## 2019-07-28 MED ORDER — ONDANSETRON HCL 4 MG/2ML IJ SOLN
INTRAMUSCULAR | Status: AC
  Filled 2019-07-28: qty 2

## 2019-07-28 MED ORDER — BUPIVACAINE LIPOSOME 1.3 % IJ SUSP
INTRAMUSCULAR | Status: DC | PRN
  Administered 2019-07-28: 20 mL

## 2019-07-28 MED ORDER — PANTOPRAZOLE SODIUM 40 MG PO TBEC
80.0000 mg | DELAYED_RELEASE_TABLET | Freq: Every day | ORAL | Status: DC
  Administered 2019-07-29: 13:00:00 80 mg via ORAL
  Filled 2019-07-28: qty 2

## 2019-07-28 MED ORDER — PROPOFOL 10 MG/ML IV BOLUS
INTRAVENOUS | Status: AC
  Filled 2019-07-28: qty 20

## 2019-07-28 MED ORDER — BUPIVACAINE HCL (PF) 0.25 % IJ SOLN
INTRAMUSCULAR | Status: AC
  Filled 2019-07-28: qty 30

## 2019-07-28 MED ORDER — THROMBIN 20000 UNITS EX SOLR
CUTANEOUS | Status: DC | PRN
  Administered 2019-07-28: 20000 [IU] via TOPICAL

## 2019-07-28 MED ORDER — POVIDONE-IODINE 7.5 % EX SOLN
Freq: Once | CUTANEOUS | Status: DC
  Filled 2019-07-28: qty 118

## 2019-07-28 MED ORDER — ZOLPIDEM TARTRATE 5 MG PO TABS: 5.0000 mg | ORAL_TABLET | Freq: Every evening | ORAL | Status: DC | PRN

## 2019-07-28 MED ORDER — METHYLENE BLUE 0.5 % INJ SOLN
INTRAVENOUS | Status: AC
  Filled 2019-07-28: qty 10

## 2019-07-28 MED ORDER — OXYCODONE HCL 5 MG PO TABS: 5.0000 mg | ORAL_TABLET | Freq: Once | ORAL | Status: DC | PRN

## 2019-07-28 MED ORDER — ACETAMINOPHEN 10 MG/ML IV SOLN
INTRAVENOUS | Status: AC
  Filled 2019-07-28: qty 100

## 2019-07-28 MED ORDER — ONDANSETRON HCL 4 MG PO TABS: 4.0000 mg | ORAL_TABLET | Freq: Four times a day (QID) | ORAL | Status: DC | PRN

## 2019-07-28 MED ORDER — BUPIVACAINE HCL (PF) 0.25 % IJ SOLN
INTRAMUSCULAR | Status: AC
  Filled 2019-07-28: qty 10

## 2019-07-28 MED ORDER — ACETAMINOPHEN 10 MG/ML IV SOLN
1000.0000 mg | Freq: Once | INTRAVENOUS | Status: DC | PRN
  Administered 2019-07-28: 1000 mg via INTRAVENOUS

## 2019-07-28 MED ORDER — ONDANSETRON HCL 4 MG/2ML IJ SOLN
INTRAMUSCULAR | Status: DC | PRN
  Administered 2019-07-28: 4 mg via INTRAVENOUS

## 2019-07-28 MED ORDER — DOCUSATE SODIUM 100 MG PO CAPS
100.0000 mg | ORAL_CAPSULE | Freq: Two times a day (BID) | ORAL | Status: DC
  Administered 2019-07-28 – 2019-07-29 (×2): 100 mg via ORAL
  Filled 2019-07-28 (×2): qty 1

## 2019-07-28 MED ORDER — ACETAMINOPHEN 325 MG PO TABS
650.0000 mg | ORAL_TABLET | ORAL | Status: DC | PRN
  Administered 2019-07-29: 04:00:00 650 mg via ORAL
  Filled 2019-07-28 (×2): qty 2

## 2019-07-28 MED ORDER — AMLODIPINE BESYLATE 5 MG PO TABS
5.0000 mg | ORAL_TABLET | Freq: Every day | ORAL | Status: DC
  Administered 2019-07-29: 08:00:00 5 mg via ORAL
  Filled 2019-07-28: qty 1

## 2019-07-28 MED ORDER — HYDROMORPHONE HCL 1 MG/ML IJ SOLN
INTRAMUSCULAR | Status: AC
  Filled 2019-07-28: qty 1

## 2019-07-28 MED ORDER — HYDROMORPHONE HCL 1 MG/ML IJ SOLN
0.2500 mg | INTRAMUSCULAR | Status: DC | PRN
  Administered 2019-07-28: 15:00:00 0.5 mg via INTRAVENOUS

## 2019-07-28 MED ORDER — MENTHOL 3 MG MT LOZG: 1.0000 | LOZENGE | OROMUCOSAL | Status: DC | PRN

## 2019-07-28 MED ORDER — METHOCARBAMOL 500 MG PO TABS
500.0000 mg | ORAL_TABLET | Freq: Four times a day (QID) | ORAL | Status: DC | PRN
  Administered 2019-07-28 – 2019-07-29 (×2): 500 mg via ORAL
  Filled 2019-07-28 (×2): qty 1

## 2019-07-28 MED ORDER — SODIUM CHLORIDE 0.9% FLUSH
3.0000 mL | Freq: Two times a day (BID) | INTRAVENOUS | Status: DC
  Administered 2019-07-29: 08:00:00 3 mL via INTRAVENOUS

## 2019-07-28 MED ORDER — EPINEPHRINE PF 1 MG/ML IJ SOLN
INTRAMUSCULAR | Status: DC | PRN
  Administered 2019-07-28: .15 mL

## 2019-07-28 MED ORDER — POTASSIUM CHLORIDE IN NACL 20-0.9 MEQ/L-% IV SOLN: INTRAVENOUS | Status: DC

## 2019-07-28 MED ORDER — CEFAZOLIN SODIUM-DEXTROSE 2-4 GM/100ML-% IV SOLN
2.0000 g | INTRAVENOUS | Status: AC
  Administered 2019-07-28 (×2): 2 g via INTRAVENOUS
  Filled 2019-07-28: qty 100

## 2019-07-28 MED ORDER — PHENYLEPHRINE 40 MCG/ML (10ML) SYRINGE FOR IV PUSH (FOR BLOOD PRESSURE SUPPORT)
PREFILLED_SYRINGE | INTRAVENOUS | Status: DC | PRN
  Administered 2019-07-28: 120 ug via INTRAVENOUS
  Administered 2019-07-28: 80 ug via INTRAVENOUS

## 2019-07-28 MED ORDER — FENTANYL CITRATE (PF) 100 MCG/2ML IJ SOLN
INTRAMUSCULAR | Status: DC | PRN
  Administered 2019-07-28 (×3): 50 ug via INTRAVENOUS
  Administered 2019-07-28: 150 ug via INTRAVENOUS
  Administered 2019-07-28: 50 ug via INTRAVENOUS

## 2019-07-28 MED ORDER — CEFAZOLIN SODIUM-DEXTROSE 2-4 GM/100ML-% IV SOLN
2.0000 g | Freq: Three times a day (TID) | INTRAVENOUS | Status: AC
  Administered 2019-07-28 – 2019-07-29 (×2): 2 g via INTRAVENOUS
  Filled 2019-07-28 (×2): qty 100

## 2019-07-28 MED ORDER — OXYCODONE HCL 5 MG/5ML PO SOLN: 5.0000 mg | Freq: Once | ORAL | Status: DC | PRN

## 2019-07-28 MED ORDER — THROMBIN 20000 UNITS EX SOLR
CUTANEOUS | Status: AC
  Filled 2019-07-28: qty 20000

## 2019-07-28 MED ORDER — LIDOCAINE 2% (20 MG/ML) 5 ML SYRINGE
INTRAMUSCULAR | Status: DC | PRN
  Administered 2019-07-28: 100 mg via INTRAVENOUS

## 2019-07-28 MED ORDER — MIDAZOLAM HCL 2 MG/2ML IJ SOLN
INTRAMUSCULAR | Status: AC
  Filled 2019-07-28: qty 2

## 2019-07-28 MED ORDER — FLUTICASONE PROPIONATE 50 MCG/ACT NA SUSP
2.0000 | Freq: Every day | NASAL | Status: DC
  Administered 2019-07-29: 08:00:00 2 via NASAL
  Filled 2019-07-28: qty 16

## 2019-07-28 MED ORDER — PHENOL 1.4 % MT LIQD: 1.0000 | OROMUCOSAL | Status: DC | PRN

## 2019-07-28 MED ORDER — METHOCARBAMOL 1000 MG/10ML IJ SOLN
500.0000 mg | Freq: Four times a day (QID) | INTRAVENOUS | Status: DC | PRN
  Filled 2019-07-28: qty 5

## 2019-07-28 MED ORDER — ACETAMINOPHEN 650 MG RE SUPP: 650.0000 mg | RECTAL | Status: DC | PRN

## 2019-07-28 MED ORDER — LIDOCAINE 2% (20 MG/ML) 5 ML SYRINGE
INTRAMUSCULAR | Status: AC
  Filled 2019-07-28: qty 5

## 2019-07-28 MED ORDER — PROPOFOL 10 MG/ML IV BOLUS
INTRAVENOUS | Status: DC | PRN
  Administered 2019-07-28: 110 mg via INTRAVENOUS

## 2019-07-28 MED ORDER — OXYCODONE-ACETAMINOPHEN 5-325 MG PO TABS
1.0000 | ORAL_TABLET | ORAL | Status: DC | PRN
  Administered 2019-07-28 – 2019-07-29 (×3): 2 via ORAL
  Filled 2019-07-28 (×4): qty 2

## 2019-07-28 MED ORDER — BUPIVACAINE LIPOSOME 1.3 % IJ SUSP
20.0000 mL | INTRAMUSCULAR | Status: DC
  Filled 2019-07-28: qty 20

## 2019-07-28 MED ORDER — ALUM & MAG HYDROXIDE-SIMETH 200-200-20 MG/5ML PO SUSP: 30.0000 mL | Freq: Four times a day (QID) | ORAL | Status: DC | PRN

## 2019-07-28 MED ORDER — SUGAMMADEX SODIUM 200 MG/2ML IV SOLN
INTRAVENOUS | Status: DC | PRN
  Administered 2019-07-28: 200 mg via INTRAVENOUS

## 2019-07-28 MED ORDER — CEFAZOLIN SODIUM 1 G IJ SOLR
INTRAMUSCULAR | Status: AC
  Filled 2019-07-28: qty 20

## 2019-07-28 MED ORDER — 0.9 % SODIUM CHLORIDE (POUR BTL) OPTIME
TOPICAL | Status: DC | PRN
  Administered 2019-07-28 (×3): 1000 mL

## 2019-07-28 MED ORDER — METHYLENE BLUE 0.5 % INJ SOLN
INTRAVENOUS | Status: DC | PRN
  Administered 2019-07-28: 1 mL via SUBMUCOSAL

## 2019-07-28 MED ORDER — MORPHINE SULFATE (PF) 2 MG/ML IV SOLN
1.0000 mg | INTRAVENOUS | Status: DC | PRN
  Administered 2019-07-28: 18:00:00 2 mg via INTRAVENOUS
  Filled 2019-07-28: qty 1

## 2019-07-28 MED ORDER — MIDAZOLAM HCL 5 MG/5ML IJ SOLN
INTRAMUSCULAR | Status: DC | PRN
  Administered 2019-07-28: 2 mg via INTRAVENOUS

## 2019-07-28 MED ORDER — GABAPENTIN 300 MG PO CAPS
300.0000 mg | ORAL_CAPSULE | Freq: Every evening | ORAL | Status: DC
  Administered 2019-07-28: 300 mg via ORAL
  Filled 2019-07-28: qty 1

## 2019-07-28 MED ORDER — BUPIVACAINE HCL (PF) 0.25 % IJ SOLN
INTRAMUSCULAR | Status: DC | PRN
  Administered 2019-07-28 (×2): 30 mL

## 2019-07-28 MED ORDER — SENNOSIDES-DOCUSATE SODIUM 8.6-50 MG PO TABS: 1.0000 | ORAL_TABLET | Freq: Every evening | ORAL | Status: DC | PRN

## 2019-07-28 MED ORDER — SODIUM CHLORIDE 0.9 % IV SOLN: INTRAVENOUS | Status: DC | PRN

## 2019-07-28 SURGICAL SUPPLY — 91 items
BENZOIN TINCTURE PRP APPL 2/3 (GAUZE/BANDAGES/DRESSINGS) ×2 IMPLANT
BLADE CLIPPER SURG (BLADE) IMPLANT
BONE VIVIGEN FORMABLE 10CC (Bone Implant) ×4 IMPLANT
BUR PRESCISION 1.7 ELITE (BURR) ×2 IMPLANT
BUR ROUND FLUTED 5 RND (BURR) ×2 IMPLANT
BUR ROUND PRECISION 4.0 (BURR) IMPLANT
BUR SABER RD CUTTING 3.0 (BURR) IMPLANT
CAGE CONCORDE BULLET 9X11X23 (Cage) ×1 IMPLANT
CAGE CONCORDE BULLET 9X9X23 (Cage) ×1 IMPLANT
CAGE SPNL 5D BLT 23X9X9X (Cage) ×1 IMPLANT
CAGE SPNL 5D BLT NOSE 23X9X11 (Cage) ×1 IMPLANT
CARTRIDGE OIL MAESTRO DRILL (MISCELLANEOUS) ×1 IMPLANT
CLSR STERI-STRIP ANTIMIC 1/2X4 (GAUZE/BANDAGES/DRESSINGS) ×2 IMPLANT
CNTNR URN SCR LID CUP LEK RST (MISCELLANEOUS) ×1 IMPLANT
CONT SPEC 4OZ STRL OR WHT (MISCELLANEOUS) ×1
COVER MAYO STAND STRL (DRAPES) ×4 IMPLANT
COVER SURGICAL LIGHT HANDLE (MISCELLANEOUS) ×2 IMPLANT
COVER WAND RF STERILE (DRAPES) IMPLANT
DECANTER SPIKE VIAL GLASS SM (MISCELLANEOUS) ×2 IMPLANT
DIFFUSER DRILL AIR PNEUMATIC (MISCELLANEOUS) ×2 IMPLANT
DRAIN CHANNEL 15F RND FF W/TCR (WOUND CARE) ×2 IMPLANT
DRAPE C-ARM 42X72 X-RAY (DRAPES) ×2 IMPLANT
DRAPE C-ARMOR (DRAPES) ×2 IMPLANT
DRAPE POUCH INSTRU U-SHP 10X18 (DRAPES) ×2 IMPLANT
DRAPE SURG 17X23 STRL (DRAPES) ×8 IMPLANT
DURAPREP 26ML APPLICATOR (WOUND CARE) ×2 IMPLANT
ELECT BLADE 4.0 EZ CLEAN MEGAD (MISCELLANEOUS) ×4
ELECT CAUTERY BLADE 6.4 (BLADE) ×2 IMPLANT
ELECT REM PT RETURN 9FT ADLT (ELECTROSURGICAL) ×2
ELECTRODE BLDE 4.0 EZ CLN MEGD (MISCELLANEOUS) ×2 IMPLANT
ELECTRODE REM PT RTRN 9FT ADLT (ELECTROSURGICAL) ×1 IMPLANT
EVACUATOR SILICONE 100CC (DRAIN) ×2 IMPLANT
FEE INTRAOP MONITOR IMPULS NCS (MISCELLANEOUS) ×1 IMPLANT
FILTER STRAW FLUID ASPIR (MISCELLANEOUS) ×4 IMPLANT
GAUZE 4X4 16PLY RFD (DISPOSABLE) ×2 IMPLANT
GAUZE SPONGE 4X4 12PLY STRL (GAUZE/BANDAGES/DRESSINGS) ×2 IMPLANT
GLOVE BIO SURGEON STRL SZ7 (GLOVE) ×2 IMPLANT
GLOVE BIO SURGEON STRL SZ8 (GLOVE) ×2 IMPLANT
GLOVE BIOGEL PI IND STRL 7.0 (GLOVE) ×1 IMPLANT
GLOVE BIOGEL PI IND STRL 8 (GLOVE) ×1 IMPLANT
GLOVE BIOGEL PI INDICATOR 7.0 (GLOVE) ×1
GLOVE BIOGEL PI INDICATOR 8 (GLOVE) ×1
GOWN STRL REUS W/ TWL LRG LVL3 (GOWN DISPOSABLE) ×2 IMPLANT
GOWN STRL REUS W/ TWL XL LVL3 (GOWN DISPOSABLE) ×1 IMPLANT
GOWN STRL REUS W/TWL LRG LVL3 (GOWN DISPOSABLE) ×2
GOWN STRL REUS W/TWL XL LVL3 (GOWN DISPOSABLE) ×1
INTRAOP MONITOR FEE IMPULS NCS (MISCELLANEOUS) ×1
INTRAOP MONITOR FEE IMPULSE (MISCELLANEOUS) ×1
IV CATH 14GX2 1/4 (CATHETERS) ×2 IMPLANT
KIT BASIN OR (CUSTOM PROCEDURE TRAY) ×2 IMPLANT
KIT POSITION SURG JACKSON T1 (MISCELLANEOUS) ×2 IMPLANT
KIT TURNOVER KIT B (KITS) ×2 IMPLANT
MARKER SKIN DUAL TIP RULER LAB (MISCELLANEOUS) ×8 IMPLANT
NEEDLE 18GX1X1/2 (RX/OR ONLY) (NEEDLE) ×2 IMPLANT
NEEDLE 22X1 1/2 (OR ONLY) (NEEDLE) ×4 IMPLANT
NEEDLE HYPO 25GX1X1/2 BEV (NEEDLE) ×2 IMPLANT
NEEDLE SPNL 18GX3.5 QUINCKE PK (NEEDLE) ×4 IMPLANT
NS IRRIG 1000ML POUR BTL (IV SOLUTION) ×4 IMPLANT
OIL CARTRIDGE MAESTRO DRILL (MISCELLANEOUS) ×2
PACK LAMINECTOMY ORTHO (CUSTOM PROCEDURE TRAY) ×2 IMPLANT
PACK UNIVERSAL I (CUSTOM PROCEDURE TRAY) ×2 IMPLANT
PAD ARMBOARD 7.5X6 YLW CONV (MISCELLANEOUS) IMPLANT
PATTIES SURGICAL .5 X1 (DISPOSABLE) ×4 IMPLANT
PATTIES SURGICAL .5X1.5 (GAUZE/BANDAGES/DRESSINGS) IMPLANT
PROBE NEUROSIGN BIPOLAR (INSTRUMENTS) ×2 IMPLANT
ROD EXPEDIUM PER BENT 65MM (Rod) ×4 IMPLANT
SCREW CORTICAL VIPER 7X35 (Screw) ×4 IMPLANT
SCREW SET SINGLE INNER (Screw) ×12 IMPLANT
SCREW VIPER CORT FIX 6.00X30 (Screw) ×8 IMPLANT
SPONGE INTESTINAL PEANUT (DISPOSABLE) ×2 IMPLANT
SPONGE LAP 4X18 RFD (DISPOSABLE) ×6 IMPLANT
SPONGE SURGIFOAM ABS GEL 100 (HEMOSTASIS) ×2 IMPLANT
STRIP CLOSURE SKIN 1/2X4 (GAUZE/BANDAGES/DRESSINGS) IMPLANT
SURGIFLO W/THROMBIN 8M KIT (HEMOSTASIS) IMPLANT
SUT ETHILON 2 0 PSLX (SUTURE) ×2 IMPLANT
SUT MNCRL AB 4-0 PS2 18 (SUTURE) ×2 IMPLANT
SUT VIC AB 0 CT1 18XCR BRD 8 (SUTURE) ×1 IMPLANT
SUT VIC AB 0 CT1 8-18 (SUTURE) ×1
SUT VIC AB 1 CT1 18XCR BRD 8 (SUTURE) ×1 IMPLANT
SUT VIC AB 1 CT1 8-18 (SUTURE) ×1
SUT VIC AB 2-0 CT2 18 VCP726D (SUTURE) ×2 IMPLANT
SYR 20ML LL LF (SYRINGE) ×4 IMPLANT
SYR BULB IRRIGATION 50ML (SYRINGE) ×2 IMPLANT
SYR CONTROL 10ML LL (SYRINGE) ×4 IMPLANT
SYR TB 1ML LUER SLIP (SYRINGE) ×4 IMPLANT
TAP EXPEDIUM DL 4.35 (INSTRUMENTS) ×2 IMPLANT
TAP EXPEDIUM DL 5.0 (INSTRUMENTS) ×2 IMPLANT
TAP EXPEDIUM DL 6.0 (INSTRUMENTS) ×2 IMPLANT
TRAY FOLEY MTR SLVR 16FR STAT (SET/KITS/TRAYS/PACK) ×2 IMPLANT
WATER STERILE IRR 1000ML POUR (IV SOLUTION) ×4 IMPLANT
YANKAUER SUCT BULB TIP NO VENT (SUCTIONS) ×2 IMPLANT

## 2019-07-28 NOTE — Anesthesia Procedure Notes (Signed)
Procedure Name: Intubation Date/Time: 07/28/2019 8:30 AM Performed by: Kyung Rudd, CRNA Pre-anesthesia Checklist: Patient identified, Emergency Drugs available, Suction available, Patient being monitored and Timeout performed Patient Re-evaluated:Patient Re-evaluated prior to induction Oxygen Delivery Method: Circle system utilized Preoxygenation: Pre-oxygenation with 100% oxygen Induction Type: IV induction Ventilation: Mask ventilation without difficulty Laryngoscope Size: Mac and 3 Grade View: Grade I Tube type: Oral Tube size: 7.0 mm Number of attempts: 1 Airway Equipment and Method: Stylet Placement Confirmation: ETT inserted through vocal cords under direct vision,  positive ETCO2 and breath sounds checked- equal and bilateral Secured at: 21 cm Tube secured with: Tape Dental Injury: Teeth and Oropharynx as per pre-operative assessment

## 2019-07-28 NOTE — Transfer of Care (Signed)
Immediate Anesthesia Transfer of Care Note  Patient: MCKAYLYN DOESCHER  Procedure(s) Performed: LEFT-SIDED LUMBAR 4- 5, LUMBAR 5 -SACRUM1 TRANSFORAMINAL LUMBAR INTERBODY FUSION WITH INSTRUMENTATION AND ALLOGRAFT (Left )  Patient Location: PACU  Anesthesia Type:General  Level of Consciousness: awake, alert  and oriented  Airway & Oxygen Therapy: Patient Spontanous Breathing and Patient connected to nasal cannula oxygen  Post-op Assessment: Report given to RN, Post -op Vital signs reviewed and stable and Patient moving all extremities X 4  Post vital signs: Reviewed and stable  Last Vitals:  Vitals Value Taken Time  BP 100/67 07/28/19 1440  Temp    Pulse 71 07/28/19 1446  Resp 18 07/28/19 1446  SpO2 100 % 07/28/19 1446  Vitals shown include unvalidated device data.  Last Pain:  Vitals:   07/28/19 0656  TempSrc:   PainSc: 0-No pain         Complications: No apparent anesthesia complications

## 2019-07-28 NOTE — Op Note (Signed)
PATIENT NAME: Jacqueline [name] RECORD NO.:   [id number]   DATE OF BIRTH: 05/17/2019   DATE OF PROCEDURE: 07/28/2019                                                            OPERATIVE REPORT     PREOPERATIVE DIAGNOSES: 1. L4/5, L5/S1 spinal stenosis. 2. L4/5 spondylolisthesis 3. L4/5, L5/S1 DDD 4. Neurogenic claudication   POSTOPERATIVE DIAGNOSES: 1. L4/5, L5/S1 spinal stenosis. 2. L4/5 spondylolisthesis 3. L4/5, L5/S1 DDD 4. Neurogenic claudication   PROCEDURES: 1. L4-5, L5-S1 decompression 2. Left-sided L4/5 and L5/S1 transforaminal lumbar interbody fusion. 3. Right-sided L4-5, L5/S1 posterolateral fusion. 4. Insertion of interbody device x 2 (Concorde intervertebral spacers). 5. Placement of posterior instrumentation L4, L5, S1 bilaterally. 6. Use of local autograft. 7. Use of morselized allograft - Vivigen 8. Intraoperative use of fluoroscopy.   SURGEON:  Phylliss Bob, MD.   ASSISTANTPricilla Holm, PA-C.   ANESTHESIA:  General endotracheal anesthesia.   COMPLICATIONS:  None.   DISPOSITION:  Stable.   ESTIMATED BLOOD LOSS:  700cc, with 310cc of blood retransfused via Cell Saver   INDICATIONS FOR SURGERY:  Briefly, [name] a pleasant 59 year-old female who did present to me with severe and ongoing pain in her back and bilateral legs.  She gained no improvement with appropriate conservative treatment, and I did feel that her symptoms were secondary to the findings noted above. She did wish to proceed with the procedure noted above.   OPERATIVE DETAILS:  On  07/28/2019, the patient was brought to surgery and general endotracheal anesthesia was administered.  The patient was placed prone on a well-padded flat Jackson bed with a spinal frame.  Antibiotics were given and a time-out procedure was performed. The back was prepped and draped in the usual fashion.  A midline incision was made overlying the L4-5 and L5-S1 intervertebral spaces.  The fascia was  incised at the midline.  The paraspinal musculature was bluntly swept laterally.  Anatomic landmarks for the pedicles were exposed. Using fluoroscopy, I did cannulate the L4, L5, and S1 pedicles bilaterally, using a medial to lateral cortical trajectory.  On the right side, the posterolateral gutter and right facet joint at L4-5 and L5-S1 was decorticated and 6 mm screws of the appropriate length were placed at L4 and L5, and a 7 mm screw was placed at S1. A 65-mm rod was placed and distraction was applied across the rod at each intervertebral level.  On the left side, the cannulated pedicle holes were filled with bone wax.  I then proceeded with the decompressive aspect of the procedure.     Starting at L5-S1, I did perform a bilateral partial facetectomy, removing substantial ligamentum hypertrophy and bone overgrowth encroaching on the right and left lateral recess.  There was also epidural lipomatosis, which was also adequately removed, decompressing the spinal canal.  This portion of the procedure clearly took significant more work in time than the interbody fusion alone.  At this point, with an assistant holding medial retraction of the traversing left S1 nerve, I did perform a thorough and complete L5-S1 intervertebral discectomy.  The intervertebral space was then liberally packed with autograft from the decompression, as well as allograft in the form of Vivigen, as was  the appropriately sized intervertebral spacer (9 mm, lordotic).  Distraction was then released on the contralateral right side.  I then turned my attention to the L4-5 level.  There was bilateral ligamentum flavum hypertrophy noted bilaterally, which was removed using Kerrison punches, thereby decompressing the right and left lateral recess.  As was the case at the L5-S1 level, the decompression required significantly more work in time than the interbody fusion portion of the procedure alone.  At this point, with an assistant holding  medial retraction of the traversing left L5 nerve, I did perform an annulotomy at the posterolateral aspect of the L4-5 intervertebral space.  I then used a series of curettes and pituitary rongeurs to perform a thorough and complete intervertebral diskectomy.  The intervertebral space was then liberally packed with autograft as well as allograft in the form of Vivigen, as was the appropriate-sized intervertebral spacer (11 mm, lordotic).  The spacer was then tamped into position in the usual fashion.  I was very pleased with the press-fit of the spacer.  I then placed 6 mm screws on the left at L4 and L5, and a 7 mm screw at S1 on the right side.  A 65-mm rod was then placed and caps were placed. The distraction was then released on the contralateral side.  All 6 caps were then locked.  The wound was copiously irrigated with a total of approximately 3 L prior to placing the bone graft.    Of note, there was substantially more venous oozing encountered throughout the surgery then what is typical for this procedure.  I did liberally use bipolar electrocautery to control any identifiable bleeding that I was able to throughout the case, however, there was a slight ooze throughout the surgery.  This was all collected via Cell Saver and retransfused.  Due to this, I did place #15 deep Blake drain deep to the fascia prior to closure. Additional autograft and allograft was then packed into the posterolateral gutter and facet joints at L4-5 and L5-S1 on the left side to help aid in the success of the fusion.  Gel-Foam was placed over the laminectomy site.  The wound was then closed in layers using #1 Vicryl followed by 2-0 Vicryl, followed by 4-0 Monocryl.  Benzoin and Steri-Strips were applied followed by sterile dressing.     Of note, did use triggered EMG to test the screws on the left, and there was no screw the tested below 20 mA.  There was no sustained abnormal EMG activity noted throughout the surgery.    Of note, Pricilla Holm was my assistant throughout surgery, and did aid in retraction, suctioning, placement of hardware, and closure.    Phylliss Bob, MD

## 2019-07-28 NOTE — Anesthesia Postprocedure Evaluation (Signed)
Anesthesia Post Note  Patient: Jacqueline [name]  Procedure(s) Performed: LEFT-SIDED LUMBAR 4- 5, LUMBAR 5 -SACRUM1 TRANSFORAMINAL LUMBAR INTERBODY FUSION WITH INSTRUMENTATION AND ALLOGRAFT (Left )     Patient location during evaluation: PACU Anesthesia Type: General Level of consciousness: awake and alert Pain management: pain level controlled Vital Signs Assessment: post-procedure vital signs reviewed and stable Respiratory status: spontaneous breathing, nonlabored ventilation, respiratory function stable and patient connected to nasal cannula oxygen Cardiovascular status: blood pressure returned to baseline and stable Postop Assessment: no apparent nausea or vomiting Anesthetic complications: no    Last Vitals:  Vitals:   07/28/19 1440 07/28/19 1455  BP: 100/67 (!) 101/57  Pulse: 73 67  Resp: 13 18  Temp: 36.7 C   SpO2: 99% 100%    Last Pain:  Vitals:   07/28/19 1440  TempSrc:   PainSc: Asleep                 Izaia Say S

## 2019-07-28 NOTE — H&P (Signed)
PREOPERATIVE H&P  Chief Complaint: Low back pain, bilateral leg pain  HPI: Jacqueline [name] is a 59 y.o. female who presents with ongoing pain in the back and legs s/p a work injury dated 04/10/2016.   MRI reveals spinal stenosis at DDD spanning L4-S1  Patient has failed multiple forms of conservative care and continues to have pain (see office notes for additional details regarding the patient's full course of treatment)  Past Medical History:  Diagnosis Date  . Anemia   . Arthritis    per patient, back  . Complication of anesthesia 2018   per patient HR dropped during partial hysterectomy surgery  . Fibroid uterus   . GERD (gastroesophageal reflux disease)   . Hyperlipidemia    Borderline  . Hypertension   . Numbness and tingling in right hand   . Pre-diabetes    borderline   Past Surgical History:  Procedure Laterality Date  . CHOLECYSTECTOMY    . COLONOSCOPY  2003   Dr. Gala Romney: Anal canal hemorrhoids  . COLONOSCOPY N/A 08/25/2014   Procedure: COLONOSCOPY;  Surgeon: Daneil Dolin, MD;  Location: [facility];  Service: Endoscopy;  Laterality: N/A;  1200  . ESOPHAGOGASTRODUODENOSCOPY N/A 08/25/2014   Procedure: ESOPHAGOGASTRODUODENOSCOPY (EGD);  Surgeon: Daneil Dolin, MD;  Location: [facility];  Service: Endoscopy;  Laterality: N/A;  . SALPINGOOPHORECTOMY Bilateral 11/06/2016   Procedure: BILATERAL SALPINGO OOPHORECTOMY;  Surgeon: Florian Buff, MD;  Location: [facility] ORS;  Service: Gynecology;  Laterality: Bilateral;  . SUPRACERVICAL ABDOMINAL HYSTERECTOMY N/A 11/06/2016   Procedure: HYSTERECTOMY SUPRACERVICAL ABDOMINAL;  Surgeon: Florian Buff, MD;  Location: [facility] OR[facility];  Service: Gynecology;  Laterality: N/A;  . TUBAL LIGATION     Social History   Socioeconomic History  . Marital status: Single    Spouse name: Not on file  . Number of children: 3  . Years of education: Not on file  . Highest education level: Not on file  Occupational History  . Not on file    Tobacco Use  . Smoking status: Never Smoker  . Smokeless tobacco: Never Used  . Tobacco comment: Never smoked  Substance and Sexual Activity  . Alcohol use: No    Alcohol/week: 0.0 standard drinks  . Drug use: No  . Sexual activity: Yes    Birth control/protection: None, Post-menopausal  Other Topics Concern  . Not on file  Social History Narrative  . Not on file   Social Determinants of Health   Financial Resource Strain:   . Difficulty of Paying Living Expenses: Not on file  Food Insecurity:   . Worried About Charity fundraiser in the Last Year: Not on file  . Ran Out of Food in the Last Year: Not on file  Transportation Needs:   . Lack of Transportation (Medical): Not on file  . Lack of Transportation (Non-Medical): Not on file  Physical Activity:   . Days of Exercise per Week: Not on file  . Minutes of Exercise per Session: Not on file  Stress:   . Feeling of Stress : Not on file  Social Connections:   . Frequency of Communication with Friends and Family: Not on file  . Frequency of Social Gatherings with Friends and Family: Not on file  . Attends Religious Services: Not on file  . Active Member of Clubs or Organizations: Not on file  . Attends Archivist Meetings: Not on file  . Marital Status: Not on file  Family History  Problem Relation Age of Onset  . Cancer Maternal Grandmother         lymphnodes  . Hypertension Maternal Aunt   . Cancer Maternal Aunt        Breast  . Colon cancer Neg Hx    No Known Allergies Prior to Admission medications   Medication Sig Start Date End Date Taking? Authorizing Provider  amLODipine (NORVASC) 5 MG tablet Take 1 tablet by mouth once daily 07/26/19  Yes Brewer, Modena Nunnery, MD  atorvastatin (LIPITOR) 20 MG tablet Take 1 tablet (20 mg total) by mouth daily. Patient taking differently: Take 20 mg by mouth at bedtime.  01/01/19  Yes [place], Modena Nunnery, MD  esomeprazole (NEXIUM) 40 MG capsule Take 1 capsule (40 mg  total) by mouth daily. 05/18/19  Yes [place], Modena Nunnery, MD  estradiol (ESTRACE) 0.1 MG/GM vaginal cream INSERT 2 GRAMS VAGINALLY AS DIRECTED EVERY OTHER NIGHT Patient taking differently: Place 1 Applicatorful vaginally every other day.  01/12/19  Yes Florian Buff, MD  gabapentin (NEURONTIN) 300 MG capsule Take 1 capsule (300 mg total) by mouth 3 (three) times daily. Patient taking differently: Take 300 mg by mouth every evening.  12/25/18  Yes [place], Modena Nunnery, MD  meloxicam (MOBIC) 15 MG tablet Take 1 tablet (15 mg total) by mouth daily. Patient taking differently: Take 15 mg by mouth daily as needed for pain.  12/25/18  Yes Gregory, Modena Nunnery, MD  Multiple Vitamin (MULTIVITAMIN WITH MINERALS) TABS tablet Take 1 tablet by mouth daily.   Yes [provider]  nystatin (NYSTATIN) powder Apply 1 application topically daily.   Yes [provider]  urea (CARMOL) 10 % cream Apply topically at bedtime. To feet for dry skin 05/18/19  Yes , Modena Nunnery, MD  clotrimazole (LOTRIMIN) 1 % cream Apply 1 application topically 2 (two) times daily. Patient not taking: Reported on 07/15/2019 06/30/18   Alycia Rossetti, MD  clotrimazole-betamethasone (LOTRISONE) cream APPLY 1 APPLICATION TOPICALLY TWO TIMES DAILY Patient not taking: Reported on 07/15/2019 05/11/19   Alycia Rossetti, MD  fluticasone [facility]) 50 MCG/ACT nasal spray Use 2 spray(s) in each nostril once daily 07/27/19   Alycia Rossetti, MD     All other systems have been reviewed and were otherwise negative with the exception of those mentioned in the HPI and as above.  Physical Exam: Vitals:   07/28/19 0643 07/28/19 0645  BP: (!) 158/79   Pulse: 84   Resp: 17   Temp:  98.3 F (36.8 C)  SpO2: 99%     Body mass index is 33.05 kg/m.  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

## 2019-07-28 NOTE — Anesthesia Preprocedure Evaluation (Signed)
Anesthesia Evaluation  Patient identified by MRN, date of birth, ID band Patient awake    Reviewed: Allergy & Precautions, NPO status , Patient's Chart, lab work & pertinent test results  Airway Mallampati: II  TM Distance: >3 FB Neck ROM: Full    Dental no notable dental hx.    Pulmonary neg pulmonary ROS,    Pulmonary exam normal breath sounds clear to auscultation       Cardiovascular hypertension, Normal cardiovascular exam Rhythm:Regular Rate:Normal     Neuro/Psych negative neurological ROS  negative psych ROS   GI/Hepatic Neg liver ROS, GERD  ,  Endo/Other  negative endocrine ROS  Renal/GU negative Renal ROS  negative genitourinary   Musculoskeletal negative musculoskeletal ROS (+)   Abdominal   Peds negative pediatric ROS (+)  Hematology negative hematology ROS (+)   Anesthesia Other Findings   Reproductive/Obstetrics negative OB ROS                             Anesthesia Physical Anesthesia Plan  ASA: II  Anesthesia Plan: General   Post-op Pain Management:    Induction: Intravenous  PONV Risk Score and Plan: 3 and Ondansetron, Dexamethasone and Treatment may vary due to age or medical condition  Airway Management Planned: Oral ETT  Additional Equipment: Arterial line  Intra-op Plan:   Post-operative Plan: Extubation in OR  Informed Consent: I have reviewed the patients History and Physical, chart, labs and discussed the procedure including the risks, benefits and alternatives for the proposed anesthesia with the patient or authorized representative who has indicated his/her understanding and acceptance.     Dental advisory given  Plan Discussed with: CRNA and Surgeon  Anesthesia Plan Comments:         Anesthesia Quick Evaluation

## 2019-07-29 LAB — POCT I-STAT, CHEM 8
BUN: 14 mg/dL (ref 6–20)
Calcium, Ion: 1.06 mmol/L — ABNORMAL LOW (ref 1.15–1.40)
Chloride: 106 mmol/L (ref 98–111)
Creatinine, Ser: 0.9 mg/dL (ref 0.44–1.00)
Glucose, Bld: 128 mg/dL — ABNORMAL HIGH (ref 70–99)
HCT: 30 % — ABNORMAL LOW (ref 36.0–46.0)
Hemoglobin: 10.2 g/dL — ABNORMAL LOW (ref 12.0–15.0)
Potassium: 5.1 mmol/L (ref 3.5–5.1)
Sodium: 139 mmol/L (ref 135–145)
TCO2: 25 mmol/L (ref 22–32)

## 2019-07-29 MED ORDER — METHOCARBAMOL 500 MG PO TABS: 500.0000 mg | ORAL_TABLET | Freq: Four times a day (QID) | ORAL | 1 refills | Status: DC | PRN

## 2019-07-29 MED ORDER — OXYCODONE-ACETAMINOPHEN 5-325 MG PO TABS: 1.0000 | ORAL_TABLET | ORAL | 0 refills | Status: DC | PRN

## 2019-07-29 NOTE — Evaluation (Addendum)
Physical Therapy Evaluation Patient Details Name: Jacqueline [name] MRN: [id number] DOB: 1960/10/18 Today's Date: 07/29/2019   History of Present Illness  59 y/o female s/p LEFT-SIDED LUMBAR 4- 5, LUMBAR 5 -SACRUM1 TRANSFORAMINAL LUMBAR INTERBODY FUSION WITH INSTRUMENTATION AND ALLOGRAFT. Injury resulted from work accident in 2017. PMH includes arthritis, anemia, HTN.  Clinical Impression  Prior to admission, pt lives with her spouse and is independent with mobility. Pt presents with decreased functional mobility secondary to expected post surgical pain, weakness, balance impairments, decreased activity tolerance. Pt reporting resolution of radicular symptoms. Pt requiring up to moderate assist for bed mobility/transfers, ambulating 200 feet with a walker at a min guard assist level. Recommended walker for all mobility and HHPT follow up. Education provided regarding spinal precautions, brace use, generalized walking program and activity progression, positioning. Will continue to follow acutely.    Follow Up Recommendations Home health PT;Supervision for mobility/OOB    Equipment Recommendations  Rolling walker with 5" wheels;3in1 (PT)    Recommendations for Other Services       Precautions / Restrictions Precautions Precautions: Fall;Back Precaution Booklet Issued: Yes (comment) Precaution Comments: reviewed in implication with BADL Required Braces or Orthoses: Spinal Brace Spinal Brace: Thoracolumbosacral orthotic;Applied in sitting position Restrictions Weight Bearing Restrictions: No Other Position/Activity Restrictions: okay to remove brace to shower; okay to ambulate to bathroom without brace      Mobility  Bed Mobility Overal bed mobility: Needs Assistance Bed Mobility: Sit to Sidelying         Sit to sidelying: Mod assist General bed mobility comments: ModA for BLE elevation back into bed  Transfers Overall transfer level: Needs assistance Equipment used: Rolling  walker (2 wheeled) Transfers: Sit to/from Stand Sit to Stand: Mod assist         General transfer comment: ModA to boost up to stand, cues for hand placement, quad/glute activation. increased time to achieve upright  Ambulation/Gait Ambulation/Gait assistance: Min guard Gait Distance (Feet): 200 Feet Assistive device: Rolling walker (2 wheeled) Gait Pattern/deviations: Step-through pattern;Decreased stride length Gait velocity: decreased   General Gait Details: Moderate reliance through arms on walker, slow pace, increased left foot external rotation. Cues for posture  Stairs            Wheelchair Mobility    Modified Rankin (Stroke Patients Only)       Balance Overall balance assessment: Needs assistance Sitting-balance support: Feet supported Sitting balance-Leahy Scale: Good     Standing balance support: Bilateral upper extremity supported Standing balance-Leahy Scale: Fair                               Pertinent Vitals/Pain Pain Assessment: Faces Faces Pain Scale: Hurts little more Pain Location: lower spinal sx site Pain Descriptors / Indicators: Pressure Pain Intervention(s): Monitored during session    Home Living Family/patient expects to be discharged to:: Private residence Living Arrangements: Spouse/significant other Available Help at Discharge: Family;Available PRN/intermittently Type of Home: House Home Access: Ramped entrance     Home Layout: One level Home Equipment: None Additional Comments: Husband will be home until end of this week, but drives trucks and is gone often. Granddaughter and other family members planning to assist as needed    Prior Function Level of Independence: Independent         Comments: reports some increasing difficulty with BADL and mobility, but otherwise independent     Hand Dominance  Extremity/Trunk Assessment   Upper Extremity Assessment Upper Extremity Assessment: Defer to OT  evaluation    Lower Extremity Assessment Lower Extremity Assessment: RLE deficits/detail;LLE deficits/detail RLE Deficits / Details: Grossly 4/5 LLE Deficits / Details: Grossly 4/5    Cervical / Trunk Assessment Cervical / Trunk Assessment: Other exceptions Cervical / Trunk Exceptions: recent lumbar spine sx  Communication   Communication: No difficulties  Cognition Arousal/Alertness: Awake/alert Behavior During Therapy: WFL for tasks assessed/performed Overall Cognitive Status: Within Functional Limits for tasks assessed                                        General Comments General comments (skin integrity, edema, etc.): drain still present at time of eval    Exercises     Assessment/Plan    PT Assessment Patient needs continued PT services  PT Problem List Decreased strength;Decreased activity tolerance;Decreased balance;Decreased mobility;Pain       PT Treatment Interventions DME instruction;Gait training;Functional mobility training;Therapeutic activities;Therapeutic exercise;Balance training;Patient/family education    PT Goals (Current goals can be found in the Care Plan section)  Acute Rehab PT Goals Patient Stated Goal: return to independence, dancing, play with grandkids PT Goal Formulation: With patient Time For Goal Achievement: 08/12/19 Potential to Achieve Goals: Good    Frequency Min 5X/week   Barriers to discharge        Co-evaluation               AM-PAC PT "6 Clicks" Mobility  Outcome Measure Help needed turning from your back to your side while in a flat bed without using bedrails?: A Little Help needed moving from lying on your back to sitting on the side of a flat bed without using bedrails?: A Lot Help needed moving to and from a bed to a chair (including a wheelchair)?: A Little Help needed standing up from a chair using your arms (e.g., wheelchair or bedside chair)?: A Lot Help needed to walk in hospital room?: A  Little Help needed climbing 3-5 steps with a railing? : A Little 6 Click Score: 16    End of Session Equipment Utilized During Treatment: Gait belt;Back brace Activity Tolerance: Patient tolerated treatment well Patient left: in bed;with call bell/phone within reach Nurse Communication: Mobility status PT Visit Diagnosis: Difficulty in walking, not elsewhere classified (R26.2);Pain Pain - part of body: (back)    Time: [id number] PT Time Calculation (min) (ACUTE ONLY): 32 min   Charges:   PT Evaluation $PT Eval Low Complexity: 1 Low PT Treatments $Therapeutic Activity: 8-22 mins          Wyona Almas, PT, DPT Acute Rehabilitation Services Pager [phone] Office [phone]   Deno Etienne 07/29/2019, 10:16 AM

## 2019-07-29 NOTE — TOC Initial Note (Signed)
Transition of Care [facility]) - Initial/Assessment Note    Patient Details  Name: Jacqueline [name] MRN: [id number] Date of Birth: 03/17/1961  [facility]) CM/SW Contact:    Ella Bodo, RN Phone Number: 07/29/2019, 11:34 AM  Clinical Narrative:  59 y/o female s/p LEFT-SIDED LUMBAR 4- 5, LUMBAR 5 -SACRUM1 TRANSFORAMINAL LUMBAR INTERBODY FUSION WITH INSTRUMENTATION AND ALLOGRAFT. Injury resulted from work accident in 2017.  PTA, pt independent, lives at home with spouse.  PT recommending [place] follow up, and DME for home.  Case is worker's compensation, and pt states she has a WC Tourist information centre manager, Candy, who is assisting her.  Spoke with Freida Busman (phone [phone]), and made her aware of recommendation of HHPT follow up and DME.  Faxed clinical information, PT recommendations and DME orders to her at [phone], per her request.   RW and 3 in 1 to be given to pt from [facility] floor stock.  [place] Claim # [id number].                   Expected Discharge Plan: [place] Barriers to Discharge: Barriers Resolved   Patient Goals and CMS Choice Patient states their goals for this hospitalization and ongoing recovery are:: to feel better soon CMS Medicare.gov Compare Post Acute Care list provided to:: Patient Choice offered to / list presented to : Patient  Expected Discharge Plan and Services Expected Discharge Plan: [place]   Discharge Planning Services: CM Consult Post Acute Care Choice: Home Health   Expected Discharge Date: 07/29/19               DME Arranged: Berta Minor DME Agency: [employer] Date DME Agency Contacted: 07/29/19 Time DME Agency Contacted: 1000 Representative spoke with at DME Agency: DME on floor HH Arranged: PT       Representative spoke with at [place]: [place] with [place] to arrange per MD order  Prior Living Arrangements/Services   Lives with:: Spouse Patient language and need for interpreter reviewed:: Yes Do you feel safe  going back to the place where you live?: Yes      Need for Family Participation in Patient Care: Yes (Comment) Care giver support system in place?: Yes (comment)   Criminal Activity/Legal Involvement Pertinent to Current Situation/Hospitalization: No - Comment as needed  Activities of Daily Living      Permission Sought/Granted   Permission granted to share information with : Yes, Verbal Permission Granted  Share Information with NAME: Candy  Permission granted to share info w AGENCY: [place] granted to share info w Relationship: WC Case Manager  Permission granted to share info w Contact Information: [phone]  Emotional Assessment   Attitude/Demeanor/Rapport: Engaged Affect (typically observed): Accepting Orientation: : Oriented to Self, Oriented to Place, Oriented to  Time, Oriented to Situation Alcohol / Substance Use: Not Applicable Psych Involvement: No (comment)  Admission diagnosis:  Radiculopathy [M54.10] Patient Active Problem List   Diagnosis Date Noted  . Radiculopathy 07/28/2019  . Borderline diabetes 12/25/2018  . Hypertension 06/30/2018  . S/P abdominal supracervical subtotal hysterectomy 11/06/2016  . GERD (gastroesophageal reflux disease) 08/04/2014  . Shoulder strain 07/26/2014  . Degenerative arthritis of thumb 07/26/2014  . Left knee pain 02/28/2014  . Hip pain 04/15/2013  . Epicondylitis, lateral (tennis elbow) 04/13/2013  . Constipation 09/15/2012  . Hyperlipidemia 11/28/2011  . Obesity, Class I, BMI 30.0-34.9 (see actual BMI) 09/15/2011  . ALLERGIC RHINITIS, SEASONAL 11/13/2007  PCP:  Alycia Rossetti, MD Pharmacy:   [facility] [address], [place] - [place] [address] [phone] [place] [address] Phone: [phone] Fax: [phone]     Social Determinants of Health (SDOH) Interventions    Readmission Risk Interventions No flowsheet data found.   Reinaldo Raddle, RN, BSN   Trauma/Neuro ICU Case Manager [phone]

## 2019-07-29 NOTE — Progress Notes (Signed)
    Patient doing well  Patient has been ambulating well Denies leg pain   Physical Exam: Vitals:   07/28/19 2334 07/29/19 0406  BP: (!) 97/57 100/63  Pulse: 69 71  Resp: 20 20  Temp: 98.3 F (36.8 C) 98.7 F (37.1 C)  SpO2: 97% 92%   Patient looks excellent, and reports very minimal pain - sitting at edge of the bed Dressing in place NVI  Drain output: 60cc/11 hours recorded up until 8am, and since then, output has been minimal  POD #1 s/p L4-S1 decompression and fusion, doing well  - up with PT/OT, encourage ambulation - Percocet for pain, Robaxin for muscle spasms - d/c home today with f/u in 2 weeks

## 2019-07-29 NOTE — Evaluation (Signed)
Occupational Therapy Evaluation Patient Details Name: Jacqueline [name] MRN: [id number] DOB: [birth year] Today's Date: 07/29/2019    History of Present Illness 59 y/o female s/p LEFT-SIDED LUMBAR 4- 5, LUMBAR 5 -SACRUM1 TRANSFORAMINAL LUMBAR INTERBODY FUSION WITH INSTRUMENTATION AND ALLOGRAFT. Injury resulted from work accident in 2017. PMH includes arthritis, anemia, HTN.   Clinical Impression   PTA pt living with spouse, independent for BADL. At time of eval, pt able to complete sit <> stands with min A unless having external support from grab bar/therapist. Educated pt on use of BSC over toilet and use of RW for steadying and safety throughout transfers. Back handout provided and reviewed adls in detail. Pt educated on: clothing between brace, never sleep in brace, avoid sitting for long periods of time, correct bed positioning for sleeping, correct sequence for bed mobility, use of BSC as shower seat/toilet rise, LB dressing, avoiding lifting more than 5 pounds and never wash directly over incision. All education is complete and patient indicates understanding. No further OT needs identified at this time. OT will sign off, thank you for this referral.    Follow Up Recommendations  No OT follow up;Supervision - Intermittent    Equipment Recommendations  3 in 1 bedside commode    Recommendations for Other Services       Precautions / Restrictions Precautions Precautions: Fall;Back Precaution Booklet Issued: Yes (comment) Precaution Comments: reviewed in implication with BADL Required Braces or Orthoses: Spinal Brace Spinal Brace: Thoracolumbosacral orthotic;Applied in sitting position Restrictions Weight Bearing Restrictions: No Other Position/Activity Restrictions: okay to remove brace to shower; okay to ambulate to bathroom without brace      Mobility Bed Mobility               General bed mobility comments: sitting EOB with RN on arrival  Transfers Overall transfer  level: Needs assistance Equipment used: 1 person hand held assist Transfers: Sit to/from Stand Sit to Stand: Min assist         General transfer comment: min A to rise and steady with assist from therapist. Cueing for technique and maintaining precautions. Pt very slow and steady, requires external support    Balance Overall balance assessment: Mild deficits observed, not formally tested                                         ADL either performed or assessed with clinical judgement   ADL                                         General ADL Comments: pt is overall supervision level for BADL at this time. She requires the use of DME for toilet transfers, as well as cues to maintain back precautions. She demonstrated and practiced peri hygiene while following back precautions. LB dressing also addressed and pt able to cross legs to reach feet. Educated pt on use of BSC as shower seat and over toilet for home.     Vision Patient Visual Report: No change from baseline       Perception     Praxis      Pertinent Vitals/Pain Pain Assessment: Faces Faces Pain Scale: Hurts little more Pain Location: lower spinal sx site Pain Descriptors / Indicators: Aching;Sore Pain Intervention(s): Limited activity within patient's tolerance;Monitored during session;Repositioned;Premedicated before  session     Hand Dominance     Extremity/Trunk Assessment Upper Extremity Assessment Upper Extremity Assessment: Overall WFL for tasks assessed   Lower Extremity Assessment Lower Extremity Assessment: Defer to PT evaluation   Cervical / Trunk Assessment Cervical / Trunk Assessment: Other exceptions Cervical / Trunk Exceptions: recent lumbar spine sx   Communication Communication Communication: No difficulties   Cognition Arousal/Alertness: Awake/alert Behavior During Therapy: WFL for tasks assessed/performed Overall Cognitive Status: Within Functional  Limits for tasks assessed                                     General Comments  drain still present at time of eval    Exercises     Shoulder Instructions      Home Living Family/patient expects to be discharged to:: Private residence Living Arrangements: Spouse/significant other Available Help at Discharge: Family;Available PRN/intermittently Type of Home: House Home Access: Ramped entrance     Home Layout: One level     Bathroom Shower/Tub: Occupational psychologist: Standard Bathroom Accessibility: Yes How Accessible: Accessible via walker Home Equipment: None   Additional Comments: Husband will be home until end of this week, but drives trucks and is gone often. Granddaughter and other family members planning to assist as needed      Prior Functioning/Environment Level of Independence: Independent        Comments: reports some increasing difficulty with BADL and mobility, but otherwise independent        OT Problem List: Decreased knowledge of use of DME or AE;Decreased knowledge of precautions;Decreased activity tolerance;Impaired balance (sitting and/or standing);Pain      OT Treatment/Interventions:      OT Goals(Current goals can be found in the care plan section) Acute Rehab OT Goals Patient Stated Goal: return to independence OT Goal Formulation: With patient Time For Goal Achievement: 08/12/19 Potential to Achieve Goals: Good  OT Frequency:     Barriers to D/C:            Co-evaluation              AM-PAC OT "6 Clicks" Daily Activity     Outcome Measure Help from another person eating meals?: None Help from another person taking care of personal grooming?: None Help from another person toileting, which includes using toliet, bedpan, or urinal?: None Help from another person bathing (including washing, rinsing, drying)?: None Help from another person to put on and taking off regular upper body clothing?:  None Help from another person to put on and taking off regular lower body clothing?: None 6 Click Score: 24   End of Session Equipment Utilized During Treatment: Gait belt;Rolling walker;Back brace Nurse Communication: Mobility status;Precautions  Activity Tolerance: Patient tolerated treatment well Patient left: in bed;with call bell/phone within reach;Other (comment)(sitting EOB beginning session with PT)  OT Visit Diagnosis: Other abnormalities of gait and mobility (R26.89);Pain Pain - part of body: (back)                Time: [id number] OT Time Calculation (min): 32 min Charges:  OT General Charges $OT Visit: 1 Visit OT Evaluation $OT Eval Low Complexity: 1 Low OT Treatments $Self Care/Home Management : 8-22 mins  Zenovia Jarred, MSOT, OTR/L Acute Rehabilitation Services [facility] Office Number: [phone]  Zenovia Jarred 07/29/2019, 9:21 AM

## 2019-07-30 ENCOUNTER — Encounter: Payer: Self-pay | Admitting: *Deleted

## 2019-08-02 MED FILL — Sodium Chloride IV Soln 0.9%: INTRAVENOUS | Qty: 3000 | Status: AC

## 2019-08-02 MED FILL — Heparin Sodium (Porcine) Inj 1000 Unit/ML: INTRAMUSCULAR | Qty: 30 | Status: AC

## 2019-08-04 ENCOUNTER — Encounter: Payer: Self-pay | Admitting: *Deleted

## 2019-08-04 NOTE — Discharge Summary (Signed)
Patient ID: Jacqueline [name] MRN: [id number] DOB/AGE: 58-11-1960 58 y.o.  Admit date: 07/28/2019 Discharge date: 07/29/2019  Admission Diagnoses:  Active Problems:   Radiculopathy   Discharge Diagnoses:  Same  Past Medical History:  Diagnosis Date  . Anemia   . Arthritis    per patient, back  . Complication of anesthesia 2018   per patient HR dropped during partial hysterectomy surgery  . Fibroid uterus   . GERD (gastroesophageal reflux disease)   . Hyperlipidemia    Borderline  . Hypertension   . Numbness and tingling in right hand   . Pre-diabetes    borderline    Surgeries: Procedure(s): LEFT-SIDED LUMBAR 4- 5, LUMBAR 5 -SACRUM1 TRANSFORAMINAL LUMBAR INTERBODY FUSION WITH INSTRUMENTATION AND ALLOGRAFT on 07/28/2019   Consultants: None  Discharged Condition: Improved  Hospital Course: Jacqueline [name] is an 59 y.o. female who was admitted 07/28/2019 for operative treatment of radiculopathy. Patient has severe unremitting pain that affects sleep, daily activities, and work/hobbies. After pre-op clearance the patient was taken to the operating room on 07/28/2019 and underwent  Procedure(s): LEFT-SIDED LUMBAR 4- 5, LUMBAR 5 -SACRUM1 TRANSFORAMINAL LUMBAR INTERBODY FUSION WITH INSTRUMENTATION AND ALLOGRAFT.    Patient was given perioperative antibiotics:  Anti-infectives (From admission, onward)   Start     Dose/Rate Route Frequency Ordered Stop   07/28/19 1745  ceFAZolin (ANCEF) IVPB 2g/100 mL premix     2 g 200 mL/hr over 30 Minutes Intravenous Every 8 hours 07/28/19 1735 07/29/19 0110   07/28/19 0645  ceFAZolin (ANCEF) IVPB 2g/100 mL premix     2 g 200 mL/hr over 30 Minutes Intravenous On call to O.R. 07/28/19 [id number] 07/28/19 1405       Patient was given sequential compression devices, early ambulation to prevent DVT.  Patient benefited maximally from hospital stay and there were no complications.    Recent vital signs: BP (!) 93/52 (BP Location: Left Arm)    Pulse 71   Temp 99.2 F (37.3 C) (Oral)   Resp 18   Ht 5' 2.5" (1.588 m)   Wt 83.3 kg   LMP 10/26/2016 (Exact Date)   SpO2 98%   BMI 33.05 kg/m    Discharge Medications:   Allergies as of 07/29/2019   No Known Allergies     Medication List    TAKE these medications   amLODipine 5 MG tablet Commonly known as: NORVASC Take 1 tablet by mouth once daily   atorvastatin 20 MG tablet Commonly known as: LIPITOR Take 1 tablet (20 mg total) by mouth daily. What changed: when to take this   clotrimazole 1 % cream Commonly known as: LOTRIMIN Apply 1 application topically 2 (two) times daily.   clotrimazole-betamethasone cream Commonly known as: LOTRISONE APPLY 1 APPLICATION TOPICALLY TWO TIMES DAILY   esomeprazole 40 MG capsule Commonly known as: NexIUM Take 1 capsule (40 mg total) by mouth daily.   estradiol 0.1 MG/GM vaginal cream Commonly known as: ESTRACE INSERT 2 GRAMS VAGINALLY AS DIRECTED EVERY OTHER NIGHT What changed: See the new instructions.   fluticasone 50 MCG/ACT nasal spray Commonly known as: FLONASE Use 2 spray(s) in each nostril once daily   gabapentin 300 MG capsule Commonly known as: NEURONTIN Take 1 capsule (300 mg total) by mouth 3 (three) times daily. What changed: when to take this   methocarbamol 500 MG tablet Commonly known as: ROBAXIN Take 1 tablet (500 mg total) by mouth every 6 (six) hours as needed for muscle spasms.  multivitamin with minerals Tabs tablet Take 1 tablet by mouth daily.   nystatin powder Generic drug: nystatin Apply 1 application topically daily.   oxyCODONE-acetaminophen 5-325 MG tablet Commonly known as: PERCOCET/ROXICET Take 1-2 tablets by mouth every 4 (four) hours as needed for moderate pain or severe pain.   urea 10 % cream Commonly known as: CARMOL Apply topically at bedtime. To feet for dry skin       Diagnostic Studies: DG Lumbar Spine 2-3 Views  Result Date: 07/28/2019 CLINICAL DATA:  Lumbar  fusion at L4-5 and L5-S1 EXAM: LUMBAR SPINE - 2-3 VIEW; DG C-ARM 1-60 MIN COMPARISON:  Prior intraoperative film from earlier in the same day. FLUOROSCOPY TIME:  Fluoroscopy Time:  1 minutes 31 seconds Radiation Exposure Index (if provided by the fluoroscopic device): Not available Number of Acquired Spot Images: 3 FINDINGS: Pedicle screws are noted at L4, L5 and S1 with posterior fixation. Interbody fusion at both levels is noted. IMPRESSION: L4-5 and L5-S1 fusion. Electronically Signed   By: Inez Catalina M.D.   On: 07/28/2019 14:19   DG Lumbar Spine 1 View  Result Date: 07/28/2019 CLINICAL DATA:  Intraoperative localization EXAM: LUMBAR SPINE - 1 VIEW COMPARISON:  None. FINDINGS: Single lateral view of the lumbar spine was obtained and reveals needles in the posterior soft tissues at the L4 and S1 levels. Disc space narrowing at L5-S1 is seen. Numbering nomenclature is standard. IMPRESSION: Intraoperative localization as described. Electronically Signed   By: Inez Catalina M.D.   On: 07/28/2019 14:16   DG C-Arm 1-60 Min  Result Date: 07/28/2019 CLINICAL DATA:  Lumbar fusion at L4-5 and L5-S1 EXAM: LUMBAR SPINE - 2-3 VIEW; DG C-ARM 1-60 MIN COMPARISON:  Prior intraoperative film from earlier in the same day. FLUOROSCOPY TIME:  Fluoroscopy Time:  1 minutes 31 seconds Radiation Exposure Index (if provided by the fluoroscopic device): Not available Number of Acquired Spot Images: 3 FINDINGS: Pedicle screws are noted at L4, L5 and S1 with posterior fixation. Interbody fusion at both levels is noted. IMPRESSION: L4-5 and L5-S1 fusion. Electronically Signed   By: Inez Catalina M.D.   On: 07/28/2019 14:19    Disposition: Discharge disposition: 01-Home or Self Care        POD #1 s/p L4-S1 decompression and fusion, doing well  - up with PT/OT, encourage ambulation - Percocet for pain, Robaxin for muscle spasms -Scripts for pain sent to pharmacy electronically  -D/C instructions sheet printed and in  chart -D/C today  -F/U in office 2 weeks   Signed: Lennie Muckle Demitrious Mccannon 08/04/2019, 10:16 AM

## 2019-08-19 ENCOUNTER — Telehealth: Payer: Self-pay | Admitting: Family Medicine

## 2019-08-19 MED ORDER — ESOMEPRAZOLE MAGNESIUM 40 MG PO CPDR: 40.0000 mg | DELAYED_RELEASE_CAPSULE | Freq: Every day | ORAL | 3 refills | Status: DC

## 2019-08-19 NOTE — Telephone Encounter (Signed)
Prescription sent to pharmacy.

## 2019-08-19 NOTE — Telephone Encounter (Signed)
Requesting a refill on her nexium sent to [facility] in [place].   CB# [phone]

## 2019-08-24 ENCOUNTER — Other Ambulatory Visit: Payer: Self-pay

## 2019-08-24 ENCOUNTER — Ambulatory Visit: Payer: BC Managed Care – PPO | Admitting: Family Medicine

## 2019-08-24 ENCOUNTER — Encounter: Payer: Self-pay | Admitting: Family Medicine

## 2019-08-24 VITALS — BP 130/62 | HR 100 | Temp 97.8°F | Resp 14 | Ht 62.5 in | Wt 180.0 lb

## 2019-08-24 DIAGNOSIS — L309 Dermatitis, unspecified: Secondary | ICD-10-CM | POA: Diagnosis not present

## 2019-08-24 DIAGNOSIS — K219 Gastro-esophageal reflux disease without esophagitis: Secondary | ICD-10-CM | POA: Diagnosis not present

## 2019-08-24 DIAGNOSIS — S90822D Blister (nonthermal), left foot, subsequent encounter: Secondary | ICD-10-CM

## 2019-08-24 DIAGNOSIS — R6889 Other general symptoms and signs: Secondary | ICD-10-CM

## 2019-08-24 DIAGNOSIS — R0989 Other specified symptoms and signs involving the circulatory and respiratory systems: Secondary | ICD-10-CM

## 2019-08-24 NOTE — Progress Notes (Signed)
   Subjective:    Patient ID: Jacqueline [name], female    DOB: September 10, 1960, 59 y.o.   MRN: [id number]  Patient presents for Medication Review (cough from cholesterol medicaiton?) and Foot Peeling   Pt here to discuss medications   S/P back surgery in Feb with fusion. Taking ultram and gabapentin still.  She is wearing a back brace.   When she takes her cholesterol medication at night, feels like she is clearing her throat at night. This has been going on for months now , no sore throat, no coughing. The symptoms last a few hours in the morning then resolves She does have reflux symptoms, takes nexium in the morning, doesn't have symptoms during the day     Last lipid panel was in was in December LDL was  87  Feet peeling with blisters that keep coming up on the sole of her foot, she has tried the lamisil, lomtrimin, urea cream currently, no improvement.  Would like to be referred to a specialist     Review Of Systems:  GEN- denies fatigue, fever, weight loss,weakness, recent illness HEENT- denies eye drainage, change in vision, nasal discharge, CVS- denies chest pain, palpitations RESP- denies SOB, cough, wheeze ABD- denies N/V, change in stools, abd pain GU- denies dysuria, hematuria, dribbling, incontinence MSK- denies joint pain, muscle aches, injury Neuro- denies headache, dizziness, syncope, seizure activity       Objective:    BP 130/62   Pulse 100   Temp 97.8 F (36.6 C) (Temporal)   Resp 14   Ht 5' 2.5" (1.588 m)   Wt 180 lb (81.6 kg)   LMP 10/26/2016 (Exact Date)   SpO2 100%   BMI 32.40 kg/m  GEN- NAD, alert and oriented x3 HEENT- PERRL, EOMI, non injected sclera, pink conjunctiva, MMM, oropharynx clear CVS- RRR, no murmur RESP-CTAB Skin intact with mild small blisters on the sole of the foot near the arches bilaterally hyperpigmented scabs over previous blistered areas peeling on the heels EXT- No edema Pulses- Radial, DP- 2+        Assessment & Plan:       Problem List Items Addressed This Visit      Unprioritized   GERD (gastroesophageal reflux disease) - Primary    Has underlying reflux the throat clearly likely not related to the cholesterol pill and likely she has uncontrolled symptoms at bedtime with reflux but she is concerned it is due to the medication.  We will do a 2-week trial off of the statin drug.  There is no improvement I recommend increasing her PPI to twice daily   Will be referred to podiatry for the friction blisters and foot dermatitis we will try treatment for antifungal and urea cream       Other Visit Diagnoses    Foot dermatitis       Relevant Orders   Ambulatory referral to Podiatry   Friction blisters of sole of left foot, subsequent encounter       Relevant Orders   Ambulatory referral to Podiatry   Throat clearing          Note: This dictation was prepared with Dragon dictation along with smaller phrase technology. Any transcriptional errors that result from this process are unintentional.

## 2019-08-24 NOTE — Patient Instructions (Addendum)
Stop the lipitor for 2 weeks If no improvement take nexium twice a day  Referral to podiatry  Call me in 2 weeks   Change f/u appt to 1st week of May

## 2019-08-25 ENCOUNTER — Encounter: Payer: Self-pay | Admitting: Family Medicine

## 2019-08-25 NOTE — Assessment & Plan Note (Signed)
Has underlying reflux the throat clearly likely not related to the cholesterol pill and likely she has uncontrolled symptoms at bedtime with reflux but she is concerned it is due to the medication.  We will do a 2-week trial off of the statin drug.  There is no improvement I recommend increasing her PPI to twice daily   Will be referred to podiatry for the friction blisters and foot dermatitis we will try treatment for antifungal and urea cream

## 2019-09-14 ENCOUNTER — Ambulatory Visit: Payer: BC Managed Care – PPO | Admitting: Family Medicine

## 2019-10-11 ENCOUNTER — Other Ambulatory Visit: Payer: Self-pay

## 2019-10-11 ENCOUNTER — Encounter: Payer: Self-pay | Admitting: Family Medicine

## 2019-10-11 ENCOUNTER — Ambulatory Visit: Payer: BC Managed Care – PPO | Admitting: Family Medicine

## 2019-10-11 VITALS — BP 124/68 | HR 88 | Temp 98.3°F | Resp 14 | Ht 62.5 in | Wt 188.0 lb

## 2019-10-11 DIAGNOSIS — M25561 Pain in right knee: Secondary | ICD-10-CM

## 2019-10-11 DIAGNOSIS — G8929 Other chronic pain: Secondary | ICD-10-CM | POA: Diagnosis not present

## 2019-10-11 DIAGNOSIS — K219 Gastro-esophageal reflux disease without esophagitis: Secondary | ICD-10-CM | POA: Diagnosis not present

## 2019-10-11 DIAGNOSIS — I10 Essential (primary) hypertension: Secondary | ICD-10-CM | POA: Diagnosis not present

## 2019-10-11 NOTE — Assessment & Plan Note (Signed)
Controlled no changes 

## 2019-10-11 NOTE — Progress Notes (Signed)
   Subjective:    Patient ID: Jacqueline [name], female    DOB: February 11, 1961, 59 y.o.   MRN: [id number]  Patient presents for Reflux (no improvement with stopping statin, so she went back on it) and Knee Pain   Pt here to f/u meds     Continues to have chronic back pain, has f/u s/p surgery on May 17th, still having spasms in back. Also continues to have Right knee pain and swleling, awaiting approval from workers comp to be seen by orthopedics  She is taking celebrex and robaxin  Xray did not show any acute abnormality back in Jan, but continues to have pain walking up stairs, feels like it is unstable and may give out on her    GERD- she did try stopping statin durg as she felt symptoms were do this this, there was no change in symptoms so she restarted nexium   She has had worsening reflux recently feels burning sensation and acid in her throat,on occ she will take extra nexium but this is rare  No changes in bowel movements        Review Of Systems:  GEN- denies fatigue, fever, weight loss,weakness, recent illness HEENT- denies eye drainage, change in vision, nasal discharge, CVS- denies chest pain, palpitations RESP- denies SOB, cough, wheeze ABD- denies N/V, change in stools, abd pain GU- denies dysuria, hematuria, dribbling, incontinence MSK- denies joint pain, muscle aches, injury Neuro- denies headache, dizziness, syncope, seizure activity       Objective:    BP 124/68   Pulse 88   Temp 98.3 F (36.8 C) (Temporal)   Resp 14   Ht 5' 2.5" (1.588 m)   Wt 188 lb (85.3 kg)   LMP 10/26/2016 (Exact Date)   SpO2 98%   BMI 33.84 kg/m  GEN- NAD, alert and oriented x3 CVS- RRR, no murmur RESP-CTAB ABD-NABS,soft,NT,ND MSK- wearing back brace- Right knee good ROM, no effusion, ligaments grossly in tact  EXT- No edema Pulses- Radial 2+        Assessment & Plan:      Problem List Items Addressed This Visit      Unprioritized   GERD (gastroesophageal reflux disease)  - Primary    Continue nexium 40mg  once in the morning If needed can take extra tums in the evening  She is avoiding triggers Taking NSAID with food      Hypertension    Controlled no changes        Other Visit Diagnoses    Chronic pain of right knee       Xray neg  but ongoing gait issues, query related to her back injury and mechanical pain with ambulation, needs ortho evaluation, on NSAID      Note: This dictation was prepared with Dragon dictation along with smaller phrase technology. Any transcriptional errors that result from this process are unintentional.

## 2019-10-11 NOTE — Patient Instructions (Addendum)
Continue nexium in the morning Chew tums at bedtime or after dinner if you have breakthrough symptoms  Call me back Knee decision.  F/U 3 months Physical

## 2019-10-11 NOTE — Assessment & Plan Note (Addendum)
Continue nexium 40mg  once in the morning If needed can take extra tums in the evening  She is avoiding triggers Taking NSAID with food

## 2019-10-26 ENCOUNTER — Other Ambulatory Visit: Payer: Self-pay | Admitting: Family Medicine

## 2019-12-24 ENCOUNTER — Other Ambulatory Visit: Payer: Self-pay | Admitting: Family Medicine

## 2020-01-11 ENCOUNTER — Other Ambulatory Visit: Payer: Self-pay

## 2020-01-11 ENCOUNTER — Encounter: Payer: Self-pay | Admitting: Family Medicine

## 2020-01-11 ENCOUNTER — Ambulatory Visit (INDEPENDENT_AMBULATORY_CARE_PROVIDER_SITE_OTHER): Payer: BC Managed Care – PPO | Admitting: Family Medicine

## 2020-01-11 VITALS — BP 128/72 | HR 88 | Temp 97.8°F | Resp 16 | Ht 62.5 in | Wt 180.0 lb

## 2020-01-11 DIAGNOSIS — E782 Mixed hyperlipidemia: Secondary | ICD-10-CM

## 2020-01-11 DIAGNOSIS — Z0001 Encounter for general adult medical examination with abnormal findings: Secondary | ICD-10-CM

## 2020-01-11 DIAGNOSIS — R7303 Prediabetes: Secondary | ICD-10-CM

## 2020-01-11 DIAGNOSIS — E669 Obesity, unspecified: Secondary | ICD-10-CM

## 2020-01-11 DIAGNOSIS — I10 Essential (primary) hypertension: Secondary | ICD-10-CM

## 2020-01-11 DIAGNOSIS — E66811 Obesity, class 1: Secondary | ICD-10-CM

## 2020-01-11 DIAGNOSIS — Z0000 Encounter for general adult medical examination without abnormal findings: Secondary | ICD-10-CM

## 2020-01-11 MED ORDER — MELOXICAM 7.5 MG PO TABS: 7.5000 mg | ORAL_TABLET | Freq: Every day | ORAL | 0 refills | Status: DC

## 2020-01-11 NOTE — Assessment & Plan Note (Signed)
Controlled no changes to meds 

## 2020-01-11 NOTE — Progress Notes (Signed)
   Subjective:    Patient ID: Jacqueline [name], female    DOB: 01/24/1961, 59 y.o.   MRN: [id number]  Patient presents for Annual Exam (is not fasting)  Pt here for CPE  MEDICATIONS  Back at work on restrictions, [employer]r [employer]on[employer] on 8/9  getting PT twice a week     Using methocarol, gabapentin and ultram as needed   HTN- taking bp meds as prescribed, no concerns with meds    She hs some cough with phelgn in the throat, using flonase past, 2 weeks  no wheezing  No wheeze, no facal    GYN- UTD   Hyperlipidemia    GERD- taking nexium    Mammogram UTD    Colonoscopy- UTD     PAP Smear UTD , now S/P hysterectomy supracervical    Immunizations- TDAP/ COVID-19   Due for repeat labs   Borderline DM 6.1%  Review Of Systems:  GEN- denies fatigue, fever, weight loss,weakness, recent illness HEENT- denies eye drainage, change in vision, nasal discharge, CVS- denies chest pain, palpitations RESP- denies SOB, cough, wheeze ABD- denies N/V, change in stools, abd pain GU- denies dysuria, hematuria, dribbling, incontinence MSK- denies joint pain, muscle aches, injury Neuro- denies headache, dizziness, syncope, seizure activity       Objective:    BP 128/72   Pulse 88   Temp 97.8 F (36.6 C) (Temporal)   Resp 16   Ht 5' 2.5" (1.588 m)   Wt 180 lb (81.6 kg)   LMP 10/26/2016 (Exact Date)   SpO2 98%   BMI 32.40 kg/m  GEN- NAD, alert and oriented x3 HEENT- PERRL, EOMI, non injected sclera, pink conjunctiva, MMM, oropharynx clear, TM clear no effusion, nares clear  Neck- Supple, no thyromegaly CVS- RRR, no murmur RESP-CTAB ABD-NABS,soft,NT,ND EXT- No edema Pulses- Radial, DP- 2+  FALL/DEPRESSION/AUDIT C negative       Assessment & Plan:      Problem List Items Addressed This Visit      Unprioritized   Borderline diabetes    Reduce simple carbs and sugars in diet Recheck A1C      Relevant Orders   Hemoglobin A1c   Hyperlipidemia   Relevant Orders   Lipid panel    Hypertension    Controlled no changes to meds       Relevant Orders   CBC with Differential/Platelet   Comprehensive metabolic panel   Lipid panel   Obesity, Class I, BMI 30.0-34.9 (see actual BMI)    Other Visit Diagnoses    Routine general medical examination at a health care facility    -  Primary   CPE, prevention UTD, fasting labs obtained      Note: This dictation was prepared with Dragon dictation along with smaller phrase technology. Any transcriptional errors that result from this process are unintentional.

## 2020-01-11 NOTE — Assessment & Plan Note (Signed)
Reduce simple carbs and sugars in diet Recheck A1C

## 2020-01-11 NOTE — Patient Instructions (Signed)
F/U 6 months

## 2020-01-12 LAB — LIPID PANEL
Cholesterol: 141 mg/dL (ref <200)
HDL: 47 mg/dL — ABNORMAL LOW (ref >=50)
LDL Cholesterol (Calc): 76 mg/dL (calc)
Non-HDL Cholesterol (Calc): 94 mg/dL (calc) (ref <130)
Total CHOL/HDL Ratio: 3 (calc) (ref <5.0)
Triglycerides: 101 mg/dL (ref <150)

## 2020-01-12 LAB — CBC WITH DIFFERENTIAL/PLATELET
Absolute Monocytes: 586 cells/uL (ref 200–950)
Basophils Absolute: 50 cells/uL (ref 0–200)
Basophils Relative: 0.8 %
Eosinophils Absolute: 202 cells/uL (ref 15–500)
Eosinophils Relative: 3.2 %
HCT: 35.9 % (ref 35.0–45.0)
Hemoglobin: 11.9 g/dL (ref 11.7–15.5)
Lymphs Abs: 2703 cells/uL (ref 850–3900)
MCH: 29.5 pg (ref 27.0–33.0)
MCHC: 33.1 g/dL (ref 32.0–36.0)
MCV: 89.1 fL (ref 80.0–100.0)
MPV: 11.8 fL (ref 7.5–12.5)
Monocytes Relative: 9.3 %
Neutro Abs: 2759 cells/uL (ref 1500–7800)
Neutrophils Relative %: 43.8 %
Platelets: 231 10*3/uL (ref 140–400)
RBC: 4.03 10*6/uL (ref 3.80–5.10)
RDW: 14.3 % (ref 11.0–15.0)
Total Lymphocyte: 42.9 %
WBC: 6.3 10*3/uL (ref 3.8–10.8)

## 2020-01-12 LAB — HEMOGLOBIN A1C
Hgb A1c MFr Bld: 6.2 % of total Hgb — ABNORMAL HIGH (ref <5.7)
Mean Plasma Glucose: 131 (calc)
eAG (mmol/L): 7.3 (calc)

## 2020-01-12 LAB — COMPREHENSIVE METABOLIC PANEL
AG Ratio: 1.3 (calc) (ref 1.0–2.5)
ALT: 17 U/L (ref 6–29)
AST: 29 U/L (ref 10–35)
Albumin: 4.3 g/dL (ref 3.6–5.1)
Alkaline phosphatase (APISO): 91 U/L (ref 37–153)
BUN: 12 mg/dL (ref 7–25)
CO2: 27 mmol/L (ref 20–32)
Calcium: 9.6 mg/dL (ref 8.6–10.4)
Chloride: 103 mmol/L (ref 98–110)
Creat: 0.95 mg/dL (ref 0.50–1.05)
Globulin: 3.2 g/dL (calc) (ref 1.9–3.7)
Glucose, Bld: 90 mg/dL (ref 65–99)
Potassium: 3.8 mmol/L (ref 3.5–5.3)
Sodium: 138 mmol/L (ref 135–146)
Total Bilirubin: 0.7 mg/dL (ref 0.2–1.2)
Total Protein: 7.5 g/dL (ref 6.1–8.1)

## 2020-01-13 ENCOUNTER — Encounter: Payer: Self-pay | Admitting: *Deleted

## 2020-01-20 ENCOUNTER — Other Ambulatory Visit: Payer: Self-pay

## 2020-01-20 ENCOUNTER — Encounter (HOSPITAL_COMMUNITY): Payer: Self-pay | Admitting: *Deleted

## 2020-01-20 ENCOUNTER — Emergency Department (HOSPITAL_COMMUNITY)
Admission: EM | Admit: 2020-01-20 | Discharge: 2020-01-20 | Disposition: A | Discharge status: Home / Self Care | Payer: BC Managed Care – PPO | Attending: Emergency Medicine | Admitting: Emergency Medicine

## 2020-01-20 DIAGNOSIS — R55 Syncope and collapse: Secondary | ICD-10-CM | POA: Diagnosis present

## 2020-01-20 DIAGNOSIS — I10 Essential (primary) hypertension: Secondary | ICD-10-CM | POA: Diagnosis not present

## 2020-01-20 DIAGNOSIS — R531 Weakness: Secondary | ICD-10-CM | POA: Insufficient documentation

## 2020-01-20 DIAGNOSIS — Z79899 Other long term (current) drug therapy: Secondary | ICD-10-CM | POA: Insufficient documentation

## 2020-01-20 LAB — COMPREHENSIVE METABOLIC PANEL
ALT: 19 U/L (ref 0–44)
AST: 25 U/L (ref 15–41)
Albumin: 4 g/dL (ref 3.5–5.0)
Alkaline Phosphatase: 77 U/L (ref 38–126)
Anion gap: 11 (ref 5–15)
BUN: 15 mg/dL (ref 6–20)
CO2: 24 mmol/L (ref 22–32)
Calcium: 8.9 mg/dL (ref 8.9–10.3)
Chloride: 102 mmol/L (ref 98–111)
Creatinine, Ser: 0.85 mg/dL (ref 0.44–1.00)
GFR calc Af Amer: >60 mL/min (ref >60)
GFR calc non Af Amer: >60 mL/min (ref >60)
Glucose, Bld: 111 mg/dL — ABNORMAL HIGH (ref 70–99)
Potassium: 3.7 mmol/L (ref 3.5–5.1)
Sodium: 137 mmol/L (ref 135–145)
Total Bilirubin: 1 mg/dL (ref 0.3–1.2)
Total Protein: 7.7 g/dL (ref 6.5–8.1)

## 2020-01-20 LAB — CBC WITH DIFFERENTIAL/PLATELET
Abs Immature Granulocytes: 0.03 10*3/uL (ref 0.00–0.07)
Basophils Absolute: 0 10*3/uL (ref 0.0–0.1)
Basophils Relative: 0 %
Eosinophils Absolute: 0.1 10*3/uL (ref 0.0–0.5)
Eosinophils Relative: 1 %
HCT: 36.6 % (ref 36.0–46.0)
Hemoglobin: 12.1 g/dL (ref 12.0–15.0)
Immature Granulocytes: 0 %
Lymphocytes Relative: 14 %
Lymphs Abs: 1.1 10*3/uL (ref 0.7–4.0)
MCH: 30.4 pg (ref 26.0–34.0)
MCHC: 33.1 g/dL (ref 30.0–36.0)
MCV: 92 fL (ref 80.0–100.0)
Monocytes Absolute: 0.7 10*3/uL (ref 0.1–1.0)
Monocytes Relative: 9 %
Neutro Abs: 5.8 10*3/uL (ref 1.7–7.7)
Neutrophils Relative %: 76 %
Platelets: 208 10*3/uL (ref 150–400)
RBC: 3.98 MIL/uL (ref 3.87–5.11)
RDW: 14.5 % (ref 11.5–15.5)
WBC: 7.6 10*3/uL (ref 4.0–10.5)
nRBC: 0 % (ref 0.0–0.2)

## 2020-01-20 LAB — URINALYSIS, ROUTINE W REFLEX MICROSCOPIC
Bilirubin Urine: NEGATIVE
Glucose, UA: NEGATIVE mg/dL
Hgb urine dipstick: NEGATIVE
Ketones, ur: NEGATIVE mg/dL
Leukocytes,Ua: NEGATIVE
Nitrite: NEGATIVE
Protein, ur: NEGATIVE mg/dL
Specific Gravity, Urine: 1.008 (ref 1.005–1.030)
pH: 8 (ref 5.0–8.0)

## 2020-01-20 LAB — TROPONIN I (HIGH SENSITIVITY)
Troponin I (High Sensitivity): <2 ng/L (ref <18)
Troponin I (High Sensitivity): <2 ng/L (ref <18)

## 2020-01-20 NOTE — ED Triage Notes (Signed)
Pt with near syncopal episode at home after taking a shower this morning.  C/o left arm pain since last week. Pt currently in PT due to recent back surgery.

## 2020-01-20 NOTE — Discharge Instructions (Addendum)
Make sure that you do not take pain medication on an empty stomach.  See your Physiciain for recheck as scheduled

## 2020-01-20 NOTE — ED Provider Notes (Signed)
[facility] Provider Note   CSN: [id number] Arrival date & time: 01/20/20  1052     History Chief Complaint  Patient presents with  . Near Syncope    Jacqueline [name] is a 59 y.o. female.  The history is provided by the patient. No language interpreter was used.  Near Syncope This is a new problem. The current episode started 3 to 5 hours ago. The problem has not changed since onset.Nothing aggravates the symptoms. Nothing relieves the symptoms. She has tried nothing for the symptoms. The treatment provided no relief.   Pt complains of feeling like she was going to pass out.  Pt reports she feels like she has congestion in her head and her ears. Pt took tramadol twice during the night.  Pt did not eat today.     Past Medical History:  Diagnosis Date  . Anemia   . Arthritis    per patient, back  . Complication of anesthesia 2018   per patient HR dropped during partial hysterectomy surgery  . Fibroid uterus   . GERD (gastroesophageal reflux disease)   . Hyperlipidemia    Borderline  . Hypertension   . Numbness and tingling in right hand   . Pre-diabetes    borderline    Patient Active Problem List   Diagnosis Date Noted  . Radiculopathy 07/28/2019  . Borderline diabetes 12/25/2018  . Hypertension 06/30/2018  . S/P abdominal supracervical subtotal hysterectomy 11/06/2016  . GERD (gastroesophageal reflux disease) 08/04/2014  . Shoulder strain 07/26/2014  . Degenerative arthritis of thumb 07/26/2014  . Left knee pain 02/28/2014  . Hip pain 04/15/2013  . Epicondylitis, lateral (tennis elbow) 04/13/2013  . Constipation 09/15/2012  . Hyperlipidemia 11/28/2011  . Obesity, Class I, BMI 30.0-34.9 (see actual BMI) 09/15/2011  . ALLERGIC RHINITIS, SEASONAL 11/13/2007    Past Surgical History:  Procedure Laterality Date  . BACK SURGERY    . CHOLECYSTECTOMY    . COLONOSCOPY  2003   Dr. Gala Romney: Anal canal hemorrhoids  . COLONOSCOPY N/A 08/25/2014    Procedure: COLONOSCOPY;  Surgeon: Daneil Dolin, MD;  Location: [facility];  Service: Endoscopy;  Laterality: N/A;  1200  . ESOPHAGOGASTRODUODENOSCOPY N/A 08/25/2014   Procedure: ESOPHAGOGASTRODUODENOSCOPY (EGD);  Surgeon: Daneil Dolin, MD;  Location: [facility];  Service: Endoscopy;  Laterality: N/A;  . SALPINGOOPHORECTOMY Bilateral 11/06/2016   Procedure: BILATERAL SALPINGO OOPHORECTOMY;  Surgeon: Florian Buff, MD;  Location: [facility];  Service: Gynecology;  Laterality: Bilateral;  . SUPRACERVICAL ABDOMINAL HYSTERECTOMY N/A 11/06/2016   Procedure: HYSTERECTOMY SUPRACERVICAL ABDOMINAL;  Surgeon: Florian Buff, MD;  Location: [facility];  Service: Gynecology;  Laterality: N/A;  . TRANSFORAMINAL LUMBAR INTERBODY FUSION (TLIF) WITH PEDICLE SCREW FIXATION 2 LEVEL Left 07/28/2019   Procedure: LEFT-SIDED LUMBAR 4- 5, LUMBAR 5 -SACRUM1 TRANSFORAMINAL LUMBAR INTERBODY FUSION WITH INSTRUMENTATION AND ALLOGRAFT;  Surgeon: Phylliss Bob, MD;  Location: [facility];  Service: Orthopedics;  Laterality: Left;  . TUBAL LIGATION       OB History    Gravida  3   Para  3   Term      Preterm      AB      Living  3     SAB      TAB      Ectopic      Multiple      Live Births              Family History  Problem Relation  Age of Onset  . Cancer Maternal Grandmother         lymphnodes  . Hypertension Maternal Aunt   . Cancer Maternal Aunt        Breast  . Colon cancer Neg Hx     Social History   Tobacco Use  . Smoking status: Never Smoker  . Smokeless tobacco: Never Used  . Tobacco comment: Never smoked  Vaping Use  . Vaping Use: Never used  Substance Use Topics  . Alcohol use: No    Alcohol/week: 0.0 standard drinks  . Drug use: No    Home Medications Prior to Admission medications   Medication Sig Start Date End Date Taking? Authorizing Provider  amLODipine (NORVASC) 5 MG tablet Take 1 tablet by mouth once daily 10/26/19   Alycia Rossetti, MD  atorvastatin (LIPITOR) 20  MG tablet Take 1 tablet (20 mg total) by mouth daily. Patient taking differently: Take 20 mg by mouth at bedtime.  01/01/19   [place], Modena Nunnery, MD  clotrimazole (LOTRIMIN) 1 % cream Apply 1 application topically 2 (two) times daily. 06/30/18   Alycia Rossetti, MD  clotrimazole-betamethasone (LOTRISONE) cream APPLY 1 APPLICATION TOPICALLY TWO TIMES DAILY 05/11/19   Alycia Rossetti, MD  esomeprazole (NEXIUM) 40 MG capsule Take 1 capsule by mouth once daily 12/24/19   Alycia Rossetti, MD  estradiol (ESTRACE) 0.1 MG/GM vaginal cream INSERT 2 GRAMS VAGINALLY AS DIRECTED EVERY OTHER NIGHT Patient taking differently: Place 1 Applicatorful vaginally every other day.  01/12/19   Florian Buff, MD  fluticasone (FLONASE) 50 MCG/ACT nasal spray Use 2 spray(s) in each nostril once daily 07/27/19   Alycia Rossetti, MD  gabapentin (NEURONTIN) 300 MG capsule Take 1 capsule (300 mg total) by mouth 3 (three) times daily. Patient taking differently: Take 300 mg by mouth every evening.  12/25/18   [place], Modena Nunnery, MD  meloxicam (MOBIC) 7.5 MG tablet Take 1 tablet (7.5 mg total) by mouth daily. 01/11/20   [place], Modena Nunnery, MD  methocarbamol (ROBAXIN) 500 MG tablet Take 1 tablet (500 mg total) by mouth every 6 (six) hours as needed for muscle spasms. 07/29/19   Phylliss Bob, MD  Multiple Vitamin (MULTIVITAMIN WITH MINERALS) TABS tablet Take 1 tablet by mouth daily.    [provider]  nystatin (NYSTATIN) powder Apply 1 application topically daily.    [provider]  traMADol Veatrice Bourbon) 50 MG tablet Take 1-2 tablets every 6 hours for pain prn 08/24/19   Alycia Rossetti, MD    Allergies    Patient has no known allergies.  Review of Systems   Review of Systems  Cardiovascular: Positive for near-syncope.  All other systems reviewed and are negative.   Physical Exam Updated Vital Signs BP (!) 147/86   Pulse 64   Temp 97.7 F (36.5 C) (Oral)   Resp 14   Ht 5' 2.5" (1.588 m)   Wt 81.6 kg    LMP 10/26/2016 (Exact Date)   SpO2 100%   BMI 32.40 kg/m   Physical Exam Vitals and nursing note reviewed.  Constitutional:      Appearance: She is well-developed.  HENT:     Head: Normocephalic.     Nose: Nose normal.     Mouth/Throat:     Mouth: Mucous membranes are moist.  Eyes:     Pupils: Pupils are equal, round, and reactive to light.  Cardiovascular:     Rate and Rhythm: Normal rate.  Pulmonary:  Effort: Pulmonary effort is normal.  Abdominal:     General: There is no distension.  Musculoskeletal:        General: Normal range of motion.     Cervical back: Normal range of motion.  Skin:    General: Skin is warm.  Neurological:     Mental Status: She is alert and oriented to person, place, and time.  Psychiatric:        Mood and Affect: Mood normal.     ED Results / Procedures / Treatments   Labs (all labs ordered are listed, but only abnormal results are displayed) Labs Reviewed  COMPREHENSIVE METABOLIC PANEL - Abnormal; Notable for the following components:      Result Value   Glucose, Bld 111 (*)    All other components within normal limits  CBC WITH DIFFERENTIAL/PLATELET  URINALYSIS, ROUTINE W REFLEX MICROSCOPIC  TROPONIN I (HIGH SENSITIVITY)  TROPONIN I (HIGH SENSITIVITY)    EKG None  Radiology No results found.  Procedures Procedures (including critical care time)  Medications Ordered in ED Medications - No data to display  ED Course  I have reviewed the triage vital signs and the nursing notes.  Pertinent labs & imaging results that were available during my care of the patient were reviewed by me and considered in my medical decision making (see chart for details).    MDM Rules/Calculators/A&P                          MDM:  Labs and ua are normal.  Pt encouraged to drink fluids.  Pt advised to eat a normal meal.  Drink plenty of fluids,  Try flonase for congestion    Final Clinical Impression(s) / ED Diagnoses Final diagnoses:    Near syncope  Weakness    Rx / DC Orders ED Discharge Orders    None    An After Visit Summary was printed and given to the patient.    Fransico Meadow, [place] 01/20/20 1555    Maudie Flakes, MD 01/21/20 [phone]

## 2020-02-02 ENCOUNTER — Other Ambulatory Visit: Payer: Self-pay | Admitting: Family Medicine

## 2020-02-21 ENCOUNTER — Other Ambulatory Visit: Payer: Self-pay | Admitting: Obstetrics & Gynecology

## 2020-02-28 ENCOUNTER — Other Ambulatory Visit: Payer: Self-pay | Admitting: Family Medicine

## 2020-03-14 ENCOUNTER — Other Ambulatory Visit: Payer: Self-pay | Admitting: Family Medicine

## 2020-03-27 ENCOUNTER — Other Ambulatory Visit: Payer: Self-pay | Admitting: Family Medicine

## 2020-04-04 ENCOUNTER — Ambulatory Visit: Payer: BC Managed Care – PPO | Admitting: Family Medicine

## 2020-04-04 ENCOUNTER — Encounter: Payer: Self-pay | Admitting: Family Medicine

## 2020-04-04 ENCOUNTER — Other Ambulatory Visit: Payer: Self-pay

## 2020-04-04 DIAGNOSIS — G8929 Other chronic pain: Secondary | ICD-10-CM | POA: Insufficient documentation

## 2020-04-04 DIAGNOSIS — M549 Dorsalgia, unspecified: Secondary | ICD-10-CM | POA: Insufficient documentation

## 2020-04-04 DIAGNOSIS — M545 Low back pain, unspecified: Secondary | ICD-10-CM | POA: Diagnosis not present

## 2020-04-04 DIAGNOSIS — Z9889 Other specified postprocedural states: Secondary | ICD-10-CM

## 2020-04-04 MED ORDER — TRAMADOL HCL 50 MG PO TABS: ORAL_TABLET | ORAL | 1 refills | Status: DC

## 2020-04-04 MED ORDER — GABAPENTIN 300 MG PO CAPS: 300.0000 mg | ORAL_CAPSULE | Freq: Three times a day (TID) | ORAL | 3 refills | Status: DC

## 2020-04-04 MED ORDER — MELOXICAM 7.5 MG PO TABS: 7.5000 mg | ORAL_TABLET | Freq: Every day | ORAL | 1 refills | Status: DC

## 2020-04-04 MED ORDER — METHOCARBAMOL 500 MG PO TABS: 500.0000 mg | ORAL_TABLET | Freq: Four times a day (QID) | ORAL | 2 refills | Status: DC | PRN

## 2020-04-04 NOTE — Progress Notes (Signed)
   Subjective:    Patient ID: Jacqueline [name], female    DOB: 04-11-1961, 59 y.o.   MRN: [id number]  Patient presents for Back Pain   Released from [facility]  2 weeks ago, her surgery was in Feb, she has rods and screws in back but still has ongoing right sided back pain. Told to come to my office for meds and further evaluation of her back pain?? I do not have any records to review from recent visits.    she is on gabapentin 300mg  taking twice day   she uses ultram typically twice a day   flexeril at bedtime  Meloxicam once a day  takes robaxin during the day  He is probably back at work full-time.  States that she was working shorter shifts when she was releasing her back on 12 hours.  She was also seen by occupational health doctor was told that her back is at 40% but she does not have any particular restrictions at work.    Review Of Systems:  GEN- denies fatigue, fever, weight loss,weakness, recent illness HEENT- denies eye drainage, change in vision, nasal discharge, CVS- denies chest pain, palpitations RESP- denies SOB, cough, wheeze ABD- denies N/V, change in stools, abd pain GU- denies dysuria, hematuria, dribbling, incontinence MSK- +j oint pain, muscle aches, injury Neuro- denies headache, dizziness, syncope, seizure activity       Objective:    BP 130/70 (BP Location: Right Arm, Patient Position: Sitting, Cuff Size: Normal)   Pulse 88   Temp 98.5 F (36.9 C) (Oral)   Ht 5' 2.5" (1.588 m)   Wt 179 lb 12.8 oz (81.6 kg)   LMP 10/26/2016 (Exact Date)   SpO2 98%   BMI 32.36 kg/m  GEN- NAD, alert and oriented x3 CVS- RRR, no murmur RESP-CTAB MSK TTP lumbar spine and right paraspinals, fair ROM, neg SLR Neuro- strength grossly intact LE, sensation in tact  EXT- No edema Pulses- Radial, DP- 2+        Assessment & Plan:      Problem List Items Addressed This Visit      Unprioritized   Chronic back pain   Relevant Medications   gabapentin (NEURONTIN)  300 MG capsule   meloxicam (MOBIC) 7.5 MG tablet   methocarbamol (ROBAXIN) 500 MG tablet   traMADol (ULTRAM) 50 MG tablet   History of back surgery    Chronic pain associated with her recent back surgery.  She is still having ongoing issues.  I will obtain   notes from her orthopedic surgeon as I am not clear on what they actually want me to do.  I will go ahead and refill her medication she was taking gabapentin twice a day on the weekend she can take it up to 3 times a day.  Meloxicam once a day muscle relaxers as needed and tramadol up to 3 times a day.  She is currently working full-time with no particular restrictions.  I would not change any of this today.  More information on what is going on.         Note: This dictation was prepared with Dragon dictation along with smaller phrase technology. Any transcriptional errors that result from this process are unintentional.

## 2020-04-04 NOTE — Assessment & Plan Note (Signed)
Chronic pain associated with her recent back surgery.  She is still having ongoing issues.  I will obtain   notes from her orthopedic surgeon as I am not clear on what they actually want me to do.  I will go ahead and refill her medication she was taking gabapentin twice a day on the weekend she can take it up to 3 times a day.  Meloxicam once a day muscle relaxers as needed and tramadol up to 3 times a day.  She is currently working full-time with no particular restrictions.  I would not change any of this today.  More information on what is going on.

## 2020-04-04 NOTE — Patient Instructions (Addendum)
Pain medication refilled  I will get records from Dr. Laurena Bering office  F/U as needed

## 2020-04-12 ENCOUNTER — Telehealth: Payer: Self-pay | Admitting: Family Medicine

## 2020-04-12 NOTE — Telephone Encounter (Signed)
Call patient.  I reviewed the notes from Dr. Lynann Bologna her back surgeon.  He has released her to go back to work and states that she does not need to come back unless there is a recurrence of an injury or significant change in her pain.  He is aware that she still has some low back pain but seems that she has achieved medical management.  She had a  functional assessment done which is why she has the particular restrictions at work.  He did ask  that I refill her medications for pain otherwise there is nothing else to do for her at this time.  If She has concerns about her ongoing pain she would need to contact Dr. Lynann Bologna directly.

## 2020-04-13 NOTE — Telephone Encounter (Signed)
Spoke with pt regarding back pain results and advised her that if there is anymore ongoing pain she would need to F/U with her back surgeon per Dr. Buelah Manis.

## 2020-05-30 ENCOUNTER — Other Ambulatory Visit: Payer: Self-pay | Admitting: Family Medicine

## 2020-06-23 ENCOUNTER — Other Ambulatory Visit: Payer: Self-pay | Admitting: Family Medicine

## 2020-06-28 ENCOUNTER — Telehealth: Payer: Self-pay | Admitting: *Deleted

## 2020-06-28 NOTE — Telephone Encounter (Signed)
Recommend she call Dr. Elonda Husky for alternative

## 2020-06-28 NOTE — Telephone Encounter (Signed)
Received call from patient.   Patient reports that she has a new insurance and Estrace cream is >$90.   Inquired as to if there is a comparable medication. Advised to contact insurance for preferred alternatives.

## 2020-07-14 ENCOUNTER — Ambulatory Visit (INDEPENDENT_AMBULATORY_CARE_PROVIDER_SITE_OTHER): Payer: 59 | Admitting: Family Medicine

## 2020-07-14 ENCOUNTER — Encounter: Payer: Self-pay | Admitting: Family Medicine

## 2020-07-14 ENCOUNTER — Other Ambulatory Visit: Payer: Self-pay

## 2020-07-14 VITALS — BP 128/78 | HR 86 | Temp 97.1°F | Resp 16 | Wt 180.0 lb

## 2020-07-14 DIAGNOSIS — I10 Essential (primary) hypertension: Secondary | ICD-10-CM

## 2020-07-14 DIAGNOSIS — R7303 Prediabetes: Secondary | ICD-10-CM

## 2020-07-14 DIAGNOSIS — K219 Gastro-esophageal reflux disease without esophagitis: Secondary | ICD-10-CM | POA: Diagnosis not present

## 2020-07-14 DIAGNOSIS — E782 Mixed hyperlipidemia: Secondary | ICD-10-CM | POA: Diagnosis not present

## 2020-07-14 DIAGNOSIS — Z9889 Other specified postprocedural states: Secondary | ICD-10-CM

## 2020-07-14 DIAGNOSIS — E669 Obesity, unspecified: Secondary | ICD-10-CM

## 2020-07-14 DIAGNOSIS — Z1231 Encounter for screening mammogram for malignant neoplasm of breast: Secondary | ICD-10-CM

## 2020-07-14 DIAGNOSIS — M545 Low back pain, unspecified: Secondary | ICD-10-CM | POA: Diagnosis not present

## 2020-07-14 DIAGNOSIS — F5104 Psychophysiologic insomnia: Secondary | ICD-10-CM

## 2020-07-14 DIAGNOSIS — G8929 Other chronic pain: Secondary | ICD-10-CM

## 2020-07-14 MED ORDER — AMLODIPINE BESYLATE 5 MG PO TABS: 5.0000 mg | ORAL_TABLET | Freq: Every day | ORAL | 1 refills | Status: DC

## 2020-07-14 MED ORDER — ATORVASTATIN CALCIUM 20 MG PO TABS: 20.0000 mg | ORAL_TABLET | Freq: Every day | ORAL | 1 refills | Status: DC

## 2020-07-14 MED ORDER — ESOMEPRAZOLE MAGNESIUM 40 MG PO CPDR: 40.0000 mg | DELAYED_RELEASE_CAPSULE | Freq: Every day | ORAL | 1 refills | Status: DC

## 2020-07-14 MED ORDER — TRAZODONE HCL 50 MG PO TABS: 25.0000 mg | ORAL_TABLET | Freq: Every evening | ORAL | 3 refills | Status: DC | PRN

## 2020-07-14 NOTE — Assessment & Plan Note (Signed)
Controlled no chanes  

## 2020-07-14 NOTE — Progress Notes (Signed)
Subjective:    Patient ID: Jacqueline [name], female    DOB: 09/24/1960, 60 y.o.   MRN: [id number]  Patient presents for Follow-up Patient here follow-up chronic medical problems.  Medications reviewed.  Hypertension she is taking her amlodipine as prescribed blood pressure has been controlled.  Hyperlipidemia taking Lipitor 20 mg without side effects  Chronic back pain she is on gabapentin and tramadol and meloxicam. She takes the gabapentin typically once a day around between 5-8pm She has not used ultram very uch, will take tylenol arthritis 650mg    SHe continues to follow with GYN she is on estrogen topically  GERD takes Nexium  She has not slept well for quite some time. She still has a lot of issues with her back but tosses and turns a lot doesnyt feel like her stress levels She has tylenol pm and melatonin  She had another spell where she felt lightheaded she had eaten something  Borderline DM - last A1C 6.2%  Has hot flashes, on topical estrogen from GYN, needs mammo and f/u visit  Positive for COVID 1 week ago, still has mild cough left   Review Of Systems:  GEN- denies fatigue, fever, weight loss,weakness, recent illness HEENT- denies eye drainage, change in vision, nasal discharge, CVS- denies chest pain, palpitations RESP- denies SOB, +cough, wheeze ABD- denies N/V, change in stools, abd pain GU- denies dysuria, hematuria, dribbling, incontinence MSK- + joint pain, muscle aches, injury Neuro- denies headache, dizziness, syncope, seizure activity       Objective:    BP 128/78   Pulse 86   Temp (!) 97.1 F (36.2 C)   Resp 16   Wt 180 lb (81.6 kg)   LMP 10/26/2016 (Exact Date)   SpO2 97%   BMI 32.40 kg/m  GEN- NAD, alert and oriented x3 HEENT- PERRL, EOMI, non injected sclera, pink conjunctiva,  Neck- Supple, no thyromegaly CVS- RRR, no murmur RESP-CTAB ABD-NABS,soft,NT,ND Psych normal affect and mood  EXT- No edema Pulses- Radial, DP-  2+        Assessment & Plan:   Note end of visit, pt has dark itchy papule with tiny bumps, states it itches Advised to use hydrocortisone OTC BID    Problem List Items Addressed This Visit      Unprioritized   Borderline diabetes   Relevant Orders   Hemoglobin A1c   Chronic back pain    No changes to meds       Relevant Medications   traZODone (DESYREL) 50 MG tablet   GERD (gastroesophageal reflux disease)    Continue nexium      Relevant Medications   esomeprazole (NEXIUM) 40 MG capsule   History of back surgery   Hyperlipidemia   Relevant Medications   atorvastatin (LIPITOR) 20 MG tablet   amLODipine (NORVASC) 5 MG tablet   Hypertension - Primary    Controlled no chanes       Relevant Medications   atorvastatin (LIPITOR) 20 MG tablet   amLODipine (NORVASC) 5 MG tablet   Other Relevant Orders   CBC with Differential/Platelet   Comprehensive metabolic panel   Obesity, Class I, BMI 30.0-34.9 (see actual BMI)    Other Visit Diagnoses    Chronic insomnia       Trial of trazodone   Encounter for screening mammogram for malignant neoplasm of breast       pt to schedule mammo, then f/u with her GYN about hormones for hot flashes   Relevant Orders  MM 3D SCREEN BREAST BILATERAL      Note: This dictation was prepared with Dragon dictation along with smaller phrase technology. Any transcriptional errors that result from this process are unintentional.

## 2020-07-14 NOTE — Assessment & Plan Note (Signed)
Continue nexium 

## 2020-07-14 NOTE — Assessment & Plan Note (Signed)
No changes to meds

## 2020-07-14 NOTE — Patient Instructions (Signed)
We will call with lab results Try the Trazodone at bedtime Call your GYN about the hot flashes Schedule a mammogram

## 2020-07-15 LAB — COMPREHENSIVE METABOLIC PANEL
AG Ratio: 1.2 (calc) (ref 1.0–2.5)
ALT: 18 U/L (ref 6–29)
AST: 25 U/L (ref 10–35)
Albumin: 4.2 g/dL (ref 3.6–5.1)
Alkaline phosphatase (APISO): 89 U/L (ref 37–153)
BUN: 13 mg/dL (ref 7–25)
CO2: 30 mmol/L (ref 20–32)
Calcium: 9.6 mg/dL (ref 8.6–10.4)
Chloride: 103 mmol/L (ref 98–110)
Creat: 0.98 mg/dL (ref 0.50–1.05)
Globulin: 3.4 g/dL (calc) (ref 1.9–3.7)
Glucose, Bld: 92 mg/dL (ref 65–99)
Potassium: 4.3 mmol/L (ref 3.5–5.3)
Sodium: 139 mmol/L (ref 135–146)
Total Bilirubin: 0.5 mg/dL (ref 0.2–1.2)
Total Protein: 7.6 g/dL (ref 6.1–8.1)

## 2020-07-15 LAB — CBC WITH DIFFERENTIAL/PLATELET
Absolute Monocytes: 455 cells/uL (ref 200–950)
Basophils Absolute: 20 cells/uL (ref 0–200)
Basophils Relative: 0.4 %
Eosinophils Absolute: 150 cells/uL (ref 15–500)
Eosinophils Relative: 3 %
HCT: 37.1 % (ref 35.0–45.0)
Hemoglobin: 12.7 g/dL (ref 11.7–15.5)
Lymphs Abs: 2390 cells/uL (ref 850–3900)
MCH: 30.7 pg (ref 27.0–33.0)
MCHC: 34.2 g/dL (ref 32.0–36.0)
MCV: 89.6 fL (ref 80.0–100.0)
MPV: 12 fL (ref 7.5–12.5)
Monocytes Relative: 9.1 %
Neutro Abs: 1985 cells/uL (ref 1500–7800)
Neutrophils Relative %: 39.7 %
Platelets: 241 10*3/uL (ref 140–400)
RBC: 4.14 10*6/uL (ref 3.80–5.10)
RDW: 13 % (ref 11.0–15.0)
Total Lymphocyte: 47.8 %
WBC: 5 10*3/uL (ref 3.8–10.8)

## 2020-07-15 LAB — HEMOGLOBIN A1C
Hgb A1c MFr Bld: 6.2 % of total Hgb — ABNORMAL HIGH (ref <5.7)
Mean Plasma Glucose: 131 mg/dL
eAG (mmol/L): 7.3 mmol/L

## 2020-07-18 ENCOUNTER — Encounter: Payer: Self-pay | Admitting: *Deleted

## 2020-08-02 ENCOUNTER — Ambulatory Visit (HOSPITAL_COMMUNITY)
Admission: RE | Admit: 2020-08-02 | Discharge: 2020-08-02 | Disposition: A | Discharge status: Home / Self Care | Payer: Self-pay | Source: Ambulatory Visit | Attending: Family Medicine | Admitting: Family Medicine

## 2020-08-02 ENCOUNTER — Other Ambulatory Visit: Payer: Self-pay

## 2020-08-02 DIAGNOSIS — Z1231 Encounter for screening mammogram for malignant neoplasm of breast: Secondary | ICD-10-CM | POA: Insufficient documentation

## 2020-08-30 ENCOUNTER — Telehealth: Payer: Self-pay | Admitting: Family Medicine

## 2020-08-30 NOTE — Telephone Encounter (Signed)
Pt called concerning her bill of $111.80. Pt stated that she has [employer] and thought that her first visit would be no charge to establish care. Can you please call   Cb#: [phone]

## 2020-09-02 ENCOUNTER — Other Ambulatory Visit: Payer: Self-pay | Admitting: Family Medicine

## 2020-09-04 ENCOUNTER — Telehealth: Payer: Self-pay | Admitting: Family Medicine

## 2020-09-04 DIAGNOSIS — K219 Gastro-esophageal reflux disease without esophagitis: Secondary | ICD-10-CM

## 2020-09-04 DIAGNOSIS — I10 Essential (primary) hypertension: Secondary | ICD-10-CM

## 2020-09-04 MED ORDER — ESOMEPRAZOLE MAGNESIUM 40 MG PO CPDR: 40.0000 mg | DELAYED_RELEASE_CAPSULE | Freq: Every day | ORAL | 0 refills | Status: DC

## 2020-09-04 MED ORDER — AMLODIPINE BESYLATE 5 MG PO TABS: 5.0000 mg | ORAL_TABLET | Freq: Every day | ORAL | 0 refills | Status: DC

## 2020-09-04 NOTE — Telephone Encounter (Signed)
Pt called needing a refill of   amLODipine (NORVASC) 5 MG tablet  esomeprazole (NEXIUM) 40 MG capsule  Sent to [facility]  Cb#: [phone]

## 2020-09-12 NOTE — Telephone Encounter (Signed)
I have reviewed patients account a bill for DOS 07/14/20 was charged as if patient didn't have insurance. I have added  her [employer] to that DOS. I have updated insurance information and I have left vm for patient advising her that I have added her insurance information and that she should disregard the statement at this time.

## 2020-09-27 ENCOUNTER — Other Ambulatory Visit: Payer: Self-pay

## 2020-09-27 ENCOUNTER — Ambulatory Visit (INDEPENDENT_AMBULATORY_CARE_PROVIDER_SITE_OTHER): Payer: 59 | Admitting: Nurse Practitioner

## 2020-09-27 ENCOUNTER — Encounter: Payer: Self-pay | Admitting: Nurse Practitioner

## 2020-09-27 VITALS — BP 128/80 | HR 80 | Temp 98.2°F | Resp 20 | Ht 62.5 in | Wt 183.0 lb

## 2020-09-27 DIAGNOSIS — K219 Gastro-esophageal reflux disease without esophagitis: Secondary | ICD-10-CM

## 2020-09-27 DIAGNOSIS — Z7689 Persons encountering health services in other specified circumstances: Secondary | ICD-10-CM

## 2020-09-27 DIAGNOSIS — M545 Low back pain, unspecified: Secondary | ICD-10-CM

## 2020-09-27 DIAGNOSIS — J301 Allergic rhinitis due to pollen: Secondary | ICD-10-CM

## 2020-09-27 DIAGNOSIS — M5410 Radiculopathy, site unspecified: Secondary | ICD-10-CM

## 2020-09-27 DIAGNOSIS — R7303 Prediabetes: Secondary | ICD-10-CM | POA: Diagnosis not present

## 2020-09-27 DIAGNOSIS — I10 Essential (primary) hypertension: Secondary | ICD-10-CM | POA: Diagnosis not present

## 2020-09-27 DIAGNOSIS — S46912A Strain of unspecified muscle, fascia and tendon at shoulder and upper arm level, left arm, initial encounter: Secondary | ICD-10-CM

## 2020-09-27 DIAGNOSIS — E782 Mixed hyperlipidemia: Secondary | ICD-10-CM

## 2020-09-27 DIAGNOSIS — G8929 Other chronic pain: Secondary | ICD-10-CM

## 2020-09-27 MED ORDER — TRAZODONE HCL 50 MG PO TABS: 25.0000 mg | ORAL_TABLET | Freq: Every evening | ORAL | 3 refills | Status: DC | PRN

## 2020-09-27 MED ORDER — FLUTICASONE PROPIONATE 50 MCG/ACT NA SUSP: NASAL | 6 refills | Status: DC

## 2020-09-27 NOTE — Assessment & Plan Note (Signed)
-  will check A1c with next set of labs

## 2020-09-27 NOTE — Patient Instructions (Signed)
Please have fasting labs drawn 2-3 days prior to your appointment so we can discuss the results during your office visit.  

## 2020-09-27 NOTE — Assessment & Plan Note (Signed)
-  will check with next set of labs

## 2020-09-27 NOTE — Assessment & Plan Note (Addendum)
-  takes gabapentin -had surgery with Dr. Othelia Pulling with [place] [facility] in 2021

## 2020-09-27 NOTE — Assessment & Plan Note (Signed)
-  BP well controlled today with amlodipine BP Readings from Last 3 Encounters:  09/27/20 128/80  07/14/20 128/78  04/04/20 130/70

## 2020-09-27 NOTE — Assessment & Plan Note (Signed)
-  takes tramadol and gabapentin

## 2020-09-27 NOTE — Assessment & Plan Note (Signed)
-  takes nexium and is doing well

## 2020-09-27 NOTE — Assessment & Plan Note (Signed)
-  refilled flonase

## 2020-09-27 NOTE — Assessment & Plan Note (Signed)
-  had PT and x-ray images previously -still has some popping and pain -has current ortho; if she has issues, she should schedule ortho appt

## 2020-09-27 NOTE — Progress Notes (Signed)
New Patient Office Visit  Subjective:  Patient ID: Jacqueline [name], female    DOB: 01/04/1961  Age: 60 y.o. MRN: [id number]  CC:  Chief Complaint  Patient presents with  . New Patient (Initial Visit)    HPI Jacqueline [name] presents for new patient visit. Transferring care from Dr. Buelah Manis. Last physical was 01/11/20. Last labs were drawn 07/14/20.  She sees Dr. Elonda Husky with [facility] for GYN needs.  She has right shoulder pain and popping.She states that she had PT and xrays on her shoulder. She has had back surgery in 2021 with R>L pain.  She still does some exercises at home without PT supervision now.  She takes gabapentin, extra strength tylenol, and meloxicam for pain, but no longer takes tramadol.  She states that she has right knee swelling that has been ongoing.  She denies right knee pain.  She saw [name]  for her back issues.  Past Medical History:  Diagnosis Date  . Anemia   . Arthritis    per patient, back  . Complication of anesthesia 2018   per patient HR dropped during partial hysterectomy surgery  . Fibroid uterus   . GERD (gastroesophageal reflux disease)   . Hyperlipidemia    Borderline  . Hypertension   . Numbness and tingling in right hand   . Pre-diabetes    borderline    Past Surgical History:  Procedure Laterality Date  . BACK SURGERY    . CHOLECYSTECTOMY    . COLONOSCOPY  2003   Dr. Gala Romney: Anal canal hemorrhoids  . COLONOSCOPY N/A 08/25/2014   Procedure: COLONOSCOPY;  Surgeon: Daneil Dolin, MD;  [facility];  Service: Endoscopy;  Laterality: N/A;  1200  . ESOPHAGOGASTRODUODENOSCOPY N/A 08/25/2014   Procedure: ESOPHAGOGASTRODUODENOSCOPY (EGD);  Surgeon: Daneil Dolin, MD;  [facility];  Service: Endoscopy;  Laterality: N/A;  . SALPINGOOPHORECTOMY Bilateral 11/06/2016   Procedure: BILATERAL SALPINGO OOPHORECTOMY;  Surgeon: Florian Buff, MD;  Location: [facility];  Service: Gynecology;  Laterality:  Bilateral;  . SUPRACERVICAL ABDOMINAL HYSTERECTOMY N/A 11/06/2016   Procedure: HYSTERECTOMY SUPRACERVICAL ABDOMINAL;  Surgeon: Florian Buff, MD;  Location: [facility];  Service: Gynecology;  Laterality: N/A;  . TRANSFORAMINAL LUMBAR INTERBODY FUSION (TLIF) WITH PEDICLE SCREW FIXATION 2 LEVEL Left 07/28/2019   Procedure: LEFT-SIDED LUMBAR 4- 5, LUMBAR 5 -SACRUM1 TRANSFORAMINAL LUMBAR INTERBODY FUSION WITH INSTRUMENTATION AND ALLOGRAFT;  Surgeon: Phylliss Bob, MD;  Location: [facility];  Service: Orthopedics;  Laterality: Left;  . TUBAL LIGATION      Family History  Problem Relation Age of Onset  . Cancer Maternal Grandmother         lymphnodes  . Hypertension Maternal Aunt   . Cancer Maternal Aunt        Breast  . Colon cancer Neg Hx     Social History   Socioeconomic History  . Marital status: Single    Spouse name: Not on file  . Number of children: 3  . Years of education: Not on file  . Highest education level: Not on file  Occupational History  . Occupation: PT at [employer]  . Smoking status: Never Smoker  . Smokeless tobacco: Never Used  . Tobacco comment: Never smoked  Vaping Use  . Vaping Use: Never used  Substance and Sexual Activity  . Alcohol use: No    Alcohol/week: 0.0 standard drinks  . Drug use: No  . Sexual activity: Yes  Birth control/protection: Post-menopausal, Surgical  Other Topics Concern  . Not on file  Social History Narrative  . Not on file   Social Determinants of Health   Financial Resource Strain: Not on file  Food Insecurity: Not on file  Transportation Needs: Not on file  Physical Activity: Not on file  Stress: Not on file  Social Connections: Not on file  Intimate Partner Violence: Not on file    ROS Review of Systems  Constitutional: Negative.   Respiratory: Negative.   Cardiovascular: Negative.   Musculoskeletal: Positive for back pain.       Right shoulder pain and popping  Psychiatric/Behavioral: Negative.      Objective:   Today's Vitals: BP 128/80   Pulse 80   Temp 98.2 F (36.8 C)   Resp 20   Ht 5' 2.5" (1.588 m)   Wt 183 lb (83 kg)   LMP 10/26/2016 (Exact Date)   SpO2 97%   BMI 32.94 kg/m   Physical Exam  Assessment & Plan:   Problem List Items Addressed This Visit      Cardiovascular and Mediastinum   Hypertension    -BP well controlled today with amlodipine BP Readings from Last 3 Encounters:  09/27/20 128/80  07/14/20 128/78  04/04/20 130/70         Relevant Orders   CBC with Differential/Platelet   CMP14+EGFR   Lipid Panel With LDL/HDL Ratio     Respiratory   ALLERGIC RHINITIS, SEASONAL    -refilled flonase      Relevant Medications   fluticasone (FLONASE) 50 MCG/ACT nasal spray     Digestive   GERD (gastroesophageal reflux disease)    -takes nexium and is doing well        Nervous and Auditory   Radiculopathy    -takes gabapentin -had surgery with Dr. Othelia Pulling with [place] [facility] in 2021      Relevant Medications   traZODone (DESYREL) 50 MG tablet     Musculoskeletal and Integument   Shoulder strain    -had PT and x-ray images previously -still has some popping and pain -has current ortho; if she has issues, she should schedule ortho appt        Other   Hyperlipidemia    -will check with next set of labs      Relevant Orders   Lipid Panel With LDL/HDL Ratio   Borderline diabetes    -will check A1c with next set of labs      Relevant Orders   Hemoglobin A1c   Lipid Panel With LDL/HDL Ratio   Chronic back pain    -takes tramadol and gabapentin      Relevant Medications   traZODone (DESYREL) 50 MG tablet    Other Visit Diagnoses    Encounter to establish care    -  Primary   Relevant Orders   CBC with Differential/Platelet   CMP14+EGFR   Hemoglobin A1c   Lipid Panel With LDL/HDL Ratio      Outpatient Encounter Medications as of 09/27/2020  Medication Sig  . amLODipine (NORVASC) 5 MG tablet Take 1 tablet (5 mg total) by  mouth daily.  Marland Kitchen atorvastatin (LIPITOR) 20 MG tablet Take 1 tablet (20 mg total) by mouth daily.  Marland Kitchen esomeprazole (NEXIUM) 40 MG capsule Take 1 capsule (40 mg total) by mouth daily.  Marland Kitchen estradiol (ESTRACE) 0.1 MG/GM vaginal cream INSERT 2 GRAMS VAGINALLY AS DIRECTED EVERY OTHER NIGHT  . fluticasone (FLONASE) 50 MCG/ACT nasal spray Use 2 sprays  in each nostril BID for a week. After 1 week, decrease to 1 spray in each nostril BID as needed for congestion/allergies.  Marland Kitchen gabapentin (NEURONTIN) 300 MG capsule Take 1 capsule (300 mg total) by mouth 3 (three) times daily.  . meloxicam (MOBIC) 7.5 MG tablet Take 1 tablet (7.5 mg total) by mouth daily.  . methocarbamol (ROBAXIN) 500 MG tablet Take 1 tablet (500 mg total) by mouth every 6 (six) hours as needed for muscle spasms.  . Multiple Vitamin (MULTIVITAMIN WITH MINERALS) TABS tablet Take 1 tablet by mouth daily.  . [DISCONTINUED] clotrimazole (LOTRIMIN) 1 % cream Apply 1 application topically 2 (two) times daily.  . [DISCONTINUED] clotrimazole-betamethasone (LOTRISONE) cream APPLY 1 APPLICATION TOPICALLY TWO TIMES DAILY  . [DISCONTINUED] fluticasone (FLONASE) 50 MCG/ACT nasal spray Use 2 spray(s) in each nostril once daily  . [DISCONTINUED] nystatin (MYCOSTATIN/NYSTOP) powder Apply 1 application topically daily.  . [DISCONTINUED] traMADol (ULTRAM) 50 MG tablet Take 1 tid prn  . [DISCONTINUED] traZODone (DESYREL) 50 MG tablet Take 0.5-1 tablets (25-50 mg total) by mouth at bedtime as needed for sleep.  . traZODone (DESYREL) 50 MG tablet Take 0.5-1 tablets (25-50 mg total) by mouth at bedtime as needed for sleep.   No facility-administered encounter medications on file as of 09/27/2020.    Follow-up: Return in about 1 month (around 10/27/2020) for Lab follow-up (HLD, HTN, IFG).   Noreene Larsson, NP

## 2020-10-05 ENCOUNTER — Other Ambulatory Visit: Payer: Self-pay | Admitting: Family Medicine

## 2020-10-05 DIAGNOSIS — I10 Essential (primary) hypertension: Secondary | ICD-10-CM

## 2020-10-10 ENCOUNTER — Other Ambulatory Visit: Payer: Self-pay

## 2020-10-10 ENCOUNTER — Telehealth: Payer: Self-pay

## 2020-10-10 DIAGNOSIS — I10 Essential (primary) hypertension: Secondary | ICD-10-CM

## 2020-10-10 MED ORDER — AMLODIPINE BESYLATE 5 MG PO TABS: 5.0000 mg | ORAL_TABLET | Freq: Every day | ORAL | 0 refills | Status: DC

## 2020-10-10 NOTE — Telephone Encounter (Signed)
Rx refill sent in.

## 2020-10-10 NOTE — Telephone Encounter (Signed)
Please call Amlodipine, in to [facility] in [place]

## 2020-10-25 ENCOUNTER — Other Ambulatory Visit: Payer: Self-pay | Admitting: Family Medicine

## 2020-10-25 ENCOUNTER — Ambulatory Visit: Payer: 59 | Admitting: Nurse Practitioner

## 2020-11-01 ENCOUNTER — Other Ambulatory Visit: Payer: Self-pay

## 2020-11-01 ENCOUNTER — Ambulatory Visit (INDEPENDENT_AMBULATORY_CARE_PROVIDER_SITE_OTHER): Payer: 59 | Admitting: Internal Medicine

## 2020-11-01 ENCOUNTER — Encounter: Payer: Self-pay | Admitting: Internal Medicine

## 2020-11-01 VITALS — BP 132/78 | HR 97 | Temp 98.4°F | Resp 18 | Ht 62.0 in | Wt 183.1 lb

## 2020-11-01 DIAGNOSIS — G8929 Other chronic pain: Secondary | ICD-10-CM | POA: Diagnosis not present

## 2020-11-01 DIAGNOSIS — R1032 Left lower quadrant pain: Secondary | ICD-10-CM

## 2020-11-01 DIAGNOSIS — M545 Low back pain, unspecified: Secondary | ICD-10-CM

## 2020-11-01 LAB — POCT URINALYSIS DIP (CLINITEK)
Blood, UA: NEGATIVE
Glucose, UA: NEGATIVE mg/dL
Leukocytes, UA: NEGATIVE
Nitrite, UA: NEGATIVE
Spec Grav, UA: >=1.03 — AB (ref 1.010–1.025)
Urobilinogen, UA: 0.2 E.U./dL
pH, UA: 5 (ref 5.0–8.0)

## 2020-11-01 MED ORDER — MELOXICAM 7.5 MG PO TABS: 7.5000 mg | ORAL_TABLET | Freq: Every day | ORAL | 1 refills | Status: DC

## 2020-11-01 MED ORDER — GABAPENTIN 300 MG PO CAPS: 300.0000 mg | ORAL_CAPSULE | Freq: Every day | ORAL | 1 refills | Status: DC

## 2020-11-01 NOTE — Patient Instructions (Signed)
Please get CT of abdomen done at [facility] as scheduled.  Please avoid heavy lifting and frequent bending.  If you develop any new symptoms like blood in urine or stool, please go to ER immediately.

## 2020-11-01 NOTE — Progress Notes (Signed)
Established Patient Office Visit  Subjective:  Patient ID: Jacqueline [name], female    DOB: [birth year]  Age: 60 y.o. MRN: [id number]  CC:  Chief Complaint  Patient presents with  . Groin Pain    Left groin pain sharp pain started last Saturday comes and goes     HPI [name] presents for evaluation of left groin/LLQ pain, which is present for last 4 days. Pain is constant, sharp, nonradiating. She currently denies any dysuria, hematuria, diarrhea, melena or hematochezia. She had last BM today, but did not have BM for 3 days prior. Denies any fever, chills, nausea or vomiting. Denies any recent injury or heavy lifting.  She asks for refill for Gabapentin and Meloxicam for back pain.  Past Medical History:  Diagnosis Date  . Anemia   . Arthritis    per patient, back  . Complication of anesthesia 2018   per patient HR dropped during partial hysterectomy surgery  . Fibroid uterus   . GERD (gastroesophageal reflux disease)   . Hyperlipidemia    Borderline  . Hypertension   . Numbness and tingling in right hand   . Pre-diabetes    borderline    Past Surgical History:  Procedure Laterality Date  . BACK SURGERY    . CHOLECYSTECTOMY    . COLONOSCOPY  2003   Dr. Gala Romney: Anal canal hemorrhoids  . COLONOSCOPY N/A 08/25/2014   Procedure: COLONOSCOPY;  Surgeon: Daneil Dolin, MD;  [facility];  Service: Endoscopy;  Laterality: N/A;  1200  . ESOPHAGOGASTRODUODENOSCOPY N/A 08/25/2014   Procedure: ESOPHAGOGASTRODUODENOSCOPY (EGD);  Surgeon: Daneil Dolin, MD;  [facility];  Service: Endoscopy;  Laterality: N/A;  . SALPINGOOPHORECTOMY Bilateral 11/06/2016   Procedure: BILATERAL SALPINGO OOPHORECTOMY;  Surgeon: Florian Buff, MD;  Location: [facility];  Service: Gynecology;  Laterality: Bilateral;  . SUPRACERVICAL ABDOMINAL HYSTERECTOMY N/A 11/06/2016   Procedure: HYSTERECTOMY SUPRACERVICAL ABDOMINAL;  Surgeon: Florian Buff, MD;  Location: [facility];  Service:  Gynecology;  Laterality: N/A;  . TRANSFORAMINAL LUMBAR INTERBODY FUSION (TLIF) WITH PEDICLE SCREW FIXATION 2 LEVEL Left 07/28/2019   Procedure: LEFT-SIDED LUMBAR 4- 5, LUMBAR 5 -SACRUM1 TRANSFORAMINAL LUMBAR INTERBODY FUSION WITH INSTRUMENTATION AND ALLOGRAFT;  Surgeon: Phylliss Bob, MD;  Location: [facility];  Service: Orthopedics;  Laterality: Left;  . TUBAL LIGATION      Family History  Problem Relation Age of Onset  . Cancer Maternal Grandmother         lymphnodes  . Hypertension Maternal Aunt   . Cancer Maternal Aunt        Breast  . Colon cancer Neg Hx     Social History   Socioeconomic History  . Marital status: Single    Spouse name: Not on file  . Number of children: 3  . Years of education: Not on file  . Highest education level: Not on file  Occupational History  . Occupation: PT at [employer]  . Smoking status: Never Smoker  . Smokeless tobacco: Never Used  . Tobacco comment: Never smoked  Vaping Use  . Vaping Use: Never used  Substance and Sexual Activity  . Alcohol use: No    Alcohol/week: 0.0 standard drinks  . Drug use: No  . Sexual activity: Yes    Birth control/protection: Post-menopausal, Surgical  Other Topics Concern  . Not on file  Social History Narrative  . Not on file   Social Determinants of Health   Financial Resource  Strain: Not on file  Food Insecurity: Not on file  Transportation Needs: Not on file  Physical Activity: Not on file  Stress: Not on file  Social Connections: Not on file  Intimate Partner Violence: Not on file    Outpatient Medications Prior to Visit  Medication Sig Dispense Refill  . amLODipine (NORVASC) 5 MG tablet Take 1 tablet (5 mg total) by mouth daily. 30 tablet 0  . atorvastatin (LIPITOR) 20 MG tablet Take 1 tablet (20 mg total) by mouth daily. 90 tablet 1  . esomeprazole (NEXIUM) 40 MG capsule Take 1 capsule (40 mg total) by mouth daily. 30 capsule 0  . estradiol (ESTRACE) 0.1 MG/GM vaginal cream  INSERT 2 GRAMS VAGINALLY AS DIRECTED EVERY OTHER NIGHT 43 g 11  . fluticasone (FLONASE) 50 MCG/ACT nasal spray Use 2 sprays in each nostril BID for a week. After 1 week, decrease to 1 spray in each nostril BID as needed for congestion/allergies. 16 g 6  . gabapentin (NEURONTIN) 300 MG capsule Take 1 capsule (300 mg total) by mouth 3 (three) times daily. 90 capsule 3  . meloxicam (MOBIC) 7.5 MG tablet Take 1 tablet (7.5 mg total) by mouth daily. 90 tablet 1  . methocarbamol (ROBAXIN) 500 MG tablet Take 1 tablet (500 mg total) by mouth every 6 (six) hours as needed for muscle spasms. 90 tablet 2  . Multiple Vitamin (MULTIVITAMIN WITH MINERALS) TABS tablet Take 1 tablet by mouth daily.    . traZODone (DESYREL) 50 MG tablet Take 0.5-1 tablets (25-50 mg total) by mouth at bedtime as needed for sleep. 30 tablet 3   No facility-administered medications prior to visit.    No Known Allergies  ROS Review of Systems  Constitutional: Negative for chills and fever.  HENT: Negative for congestion, sinus pressure, sinus pain and sore throat.   Eyes: Negative for pain and discharge.  Respiratory: Negative for cough and shortness of breath.   Cardiovascular: Negative for chest pain and palpitations.  Gastrointestinal: Positive for abdominal pain and constipation. Negative for diarrhea, nausea and vomiting.  Endocrine: Negative for polydipsia and polyuria.  Genitourinary: Negative for dysuria and hematuria.  Musculoskeletal: Positive for back pain. Negative for neck pain and neck stiffness.  Skin: Negative for rash.  Neurological: Negative for dizziness and weakness.  Psychiatric/Behavioral: Negative for agitation and behavioral problems.      Objective:    Physical Exam Vitals reviewed.  Constitutional:      General: She is not in acute distress.    Appearance: She is not diaphoretic.  HENT:     Head: Normocephalic and atraumatic.     Nose: Nose normal. No congestion.     Mouth/Throat:      Mouth: Mucous membranes are moist.     Pharynx: No posterior oropharyngeal erythema.  Eyes:     General: No scleral icterus.    Extraocular Movements: Extraocular movements intact.  Cardiovascular:     Rate and Rhythm: Normal rate and regular rhythm.     Pulses: Normal pulses.     Heart sounds: Normal heart sounds. No murmur heard.   Pulmonary:     Breath sounds: Normal breath sounds. No wheezing or rales.  Abdominal:     Palpations: Abdomen is soft.     Tenderness: There is abdominal tenderness (LLQ and left pelvic area).  Musculoskeletal:     Cervical back: Neck supple. No tenderness.     Right lower leg: No edema.     Left lower leg: No edema.  Skin:    General: Skin is warm.     Findings: No rash.  Neurological:     General: No focal deficit present.     Mental Status: She is alert and oriented to person, place, and time.  Psychiatric:        Mood and Affect: Mood normal.        Behavior: Behavior normal.     BP 132/78 (BP Location: Left Arm, Patient Position: Sitting, Cuff Size: Normal)   Pulse 97   Temp 98.4 F (36.9 C) (Oral)   Resp 18   Ht 5\' 2"  (1.575 m)   Wt 183 lb 1.9 oz (83.1 kg)   LMP 10/26/2016 (Exact Date)   SpO2 96%   BMI 33.49 kg/m  Wt Readings from Last 3 Encounters:  11/01/20 183 lb 1.9 oz (83.1 kg)  09/27/20 183 lb (83 kg)  07/14/20 180 lb (81.6 kg)     Health Maintenance Due  Topic Date Due  . COVID-19 Vaccine (3 - Booster for Moderna series) 05/31/2020    There are no preventive care reminders to display for this patient.  Lab Results  Component Value Date   TSH 0.63 12/10/2017   Lab Results  Component Value Date   WBC 5.0 07/14/2020   HGB 12.7 07/14/2020   HCT 37.1 07/14/2020   MCV 89.6 07/14/2020   PLT 241 07/14/2020   Lab Results  Component Value Date   NA 139 07/14/2020   K 4.3 07/14/2020   CO2 30 07/14/2020   GLUCOSE 92 07/14/2020   BUN 13 07/14/2020   CREATININE 0.98 07/14/2020   BILITOT 0.5 07/14/2020   ALKPHOS  77 01/20/2020   AST 25 07/14/2020   ALT 18 07/14/2020   PROT 7.6 07/14/2020   ALBUMIN 4.0 01/20/2020   CALCIUM 9.6 07/14/2020   ANIONGAP 11 01/20/2020   Lab Results  Component Value Date   CHOL 141 01/11/2020   Lab Results  Component Value Date   HDL 47 (L) 01/11/2020   Lab Results  Component Value Date   LDLCALC 76 01/11/2020   Lab Results  Component Value Date   TRIG 101 01/11/2020   Lab Results  Component Value Date   CHOLHDL 3.0 01/11/2020   Lab Results  Component Value Date   HGBA1C 6.2 (H) 07/14/2020      Assessment & Plan:   Problem List Items Addressed This Visit   None   Visit Diagnoses    Groin pain, left    -  Primary Unclear etiology Has tenderness in groin area, but no urinary symptoms Check UA Will obtain CT abdomen/pelvis to r/o any etiology related psoas muscle vs nephrolithiasis vs colitis   Relevant Orders   POCT URINALYSIS DIP (CLINITEK)   CT Abdomen Pelvis Wo Contrast   Left lower quadrant abdominal pain     Check CT abdomen pelvis to r/o diverticulitis as LLQ tenderness present   Relevant Orders   CT Abdomen Pelvis Wo Contrast     Chronic low back pain Has had spine surgery Refilled Gabapentin and Meloxicam  No orders of the defined types were placed in this encounter.   Follow-up: No follow-ups on file.    Lindell Spar, MD

## 2020-11-08 ENCOUNTER — Ambulatory Visit: Payer: 59 | Admitting: Nurse Practitioner

## 2020-11-09 ENCOUNTER — Other Ambulatory Visit: Payer: Self-pay | Admitting: Nurse Practitioner

## 2020-11-09 DIAGNOSIS — I10 Essential (primary) hypertension: Secondary | ICD-10-CM

## 2020-12-01 ENCOUNTER — Ambulatory Visit (HOSPITAL_COMMUNITY)
Admission: RE | Admit: 2020-12-01 | Discharge: 2020-12-01 | Disposition: A | Discharge status: Home / Self Care | Payer: 59 | Source: Ambulatory Visit | Attending: Internal Medicine | Admitting: Internal Medicine

## 2020-12-01 ENCOUNTER — Other Ambulatory Visit: Payer: Self-pay

## 2020-12-01 DIAGNOSIS — R1032 Left lower quadrant pain: Secondary | ICD-10-CM

## 2020-12-11 ENCOUNTER — Other Ambulatory Visit: Payer: Self-pay | Admitting: Nurse Practitioner

## 2020-12-11 DIAGNOSIS — I10 Essential (primary) hypertension: Secondary | ICD-10-CM

## 2020-12-13 ENCOUNTER — Encounter: Payer: Self-pay | Admitting: Nurse Practitioner

## 2020-12-13 ENCOUNTER — Other Ambulatory Visit: Payer: Self-pay

## 2020-12-13 ENCOUNTER — Ambulatory Visit (INDEPENDENT_AMBULATORY_CARE_PROVIDER_SITE_OTHER): Payer: 59 | Admitting: Nurse Practitioner

## 2020-12-13 DIAGNOSIS — R21 Rash and other nonspecific skin eruption: Secondary | ICD-10-CM | POA: Insufficient documentation

## 2020-12-13 MED ORDER — METHYLPREDNISOLONE ACETATE 80 MG/ML IJ SUSP
80.0000 mg | Freq: Once | INTRAMUSCULAR | Status: AC
  Administered 2020-12-13: 80 mg via INTRAMUSCULAR

## 2020-12-13 MED ORDER — TRIAMCINOLONE ACETONIDE 0.1 % EX CREA: 1.0000 "application " | TOPICAL_CREAM | Freq: Two times a day (BID) | CUTANEOUS | 0 refills | Status: DC

## 2020-12-13 NOTE — Progress Notes (Signed)
Acute Office Visit  Subjective:    Patient ID: Jacqueline [name], female    DOB: 01-31-1961, 60 y.o.   MRN: [id number]  Chief Complaint  Patient presents with   Rash    Ongoing since Saturday, recently changed detergent.    Rash  Patient is in today for rash that started on Saturday. She states that she changed her detergents recently. Her rash is pruritic and is to the parts of her body that are covered by clothing.  Past Medical History:  Diagnosis Date   Anemia    Arthritis    per patient, back   Complication of anesthesia 2018   per patient HR dropped during partial hysterectomy surgery   Fibroid uterus    GERD (gastroesophageal reflux disease)    Hyperlipidemia    Borderline   Hypertension    Numbness and tingling in right hand    Pre-diabetes    borderline    Past Surgical History:  Procedure Laterality Date   BACK SURGERY     CHOLECYSTECTOMY     COLONOSCOPY  2003   Dr. Gala Romney: Anal canal hemorrhoids   COLONOSCOPY N/A 08/25/2014   Procedure: COLONOSCOPY;  Surgeon: Daneil Dolin, MD;  Location: [facility];  Service: Endoscopy;  Laterality: N/A;  1200   ESOPHAGOGASTRODUODENOSCOPY N/A 08/25/2014   Procedure: ESOPHAGOGASTRODUODENOSCOPY (EGD);  Surgeon: Daneil Dolin, MD;  Location: [facility];  Service: Endoscopy;  Laterality: N/A;   SALPINGOOPHORECTOMY Bilateral 11/06/2016   Procedure: BILATERAL SALPINGO OOPHORECTOMY;  Surgeon: Florian Buff, MD;  Location: [facility];  Service: Gynecology;  Laterality: Bilateral;   SUPRACERVICAL ABDOMINAL HYSTERECTOMY N/A 11/06/2016   Procedure: HYSTERECTOMY SUPRACERVICAL ABDOMINAL;  Surgeon: Florian Buff, MD;  Location: [facility];  Service: Gynecology;  Laterality: N/A;   TRANSFORAMINAL LUMBAR INTERBODY FUSION (TLIF) WITH PEDICLE SCREW FIXATION 2 LEVEL Left 07/28/2019   Procedure: LEFT-SIDED LUMBAR 4- 5, LUMBAR 5 -SACRUM1 TRANSFORAMINAL LUMBAR INTERBODY FUSION WITH INSTRUMENTATION AND ALLOGRAFT;  Surgeon: Phylliss Bob, MD;   Location: [facility];  Service: Orthopedics;  Laterality: Left;   TUBAL LIGATION      Family History  Problem Relation Age of Onset   Cancer Maternal Grandmother         lymphnodes   Hypertension Maternal Aunt    Cancer Maternal Aunt        Breast   Colon cancer Neg Hx     Social History   Socioeconomic History   Marital status: Single    Spouse name: Not on file   Number of children: 3   Years of education: Not on file   Highest education level: Not on file  Occupational History   Occupation: PT at [employer]  Tobacco Use   Smoking status: Never   Smokeless tobacco: Never   Tobacco comments:    Never smoked  Vaping Use   Vaping Use: Never used  Substance and Sexual Activity   Alcohol use: No    Alcohol/week: 0.0 standard drinks   Drug use: No   Sexual activity: Yes    Birth control/protection: Post-menopausal, Surgical  Other Topics Concern   Not on file  Social History Narrative   Not on file   Social Determinants of Health   Financial Resource Strain: Not on file  Food Insecurity: Not on file  Transportation Needs: Not on file  Physical Activity: Not on file  Stress: Not on file  Social Connections: Not on file  Intimate Partner Violence: Not on file  Outpatient Medications Prior to Visit  Medication Sig Dispense Refill   amLODipine (NORVASC) 5 MG tablet Take 1 tablet by mouth once daily 30 tablet 0   atorvastatin (LIPITOR) 20 MG tablet Take 1 tablet (20 mg total) by mouth daily. 90 tablet 1   esomeprazole (NEXIUM) 40 MG capsule Take 1 capsule (40 mg total) by mouth daily. 30 capsule 0   estradiol (ESTRACE) 0.1 MG/GM vaginal cream INSERT 2 GRAMS VAGINALLY AS DIRECTED EVERY OTHER NIGHT 43 g 11   fluticasone (FLONASE) 50 MCG/ACT nasal spray Use 2 sprays in each nostril BID for a week. After 1 week, decrease to 1 spray in each nostril BID as needed for congestion/allergies. 16 g 6   gabapentin (NEURONTIN) 300 MG capsule Take 1 capsule (300 mg total) by mouth at  bedtime. 90 capsule 1   meloxicam (MOBIC) 7.5 MG tablet Take 1 tablet (7.5 mg total) by mouth daily. 90 tablet 1   methocarbamol (ROBAXIN) 500 MG tablet Take 1 tablet (500 mg total) by mouth every 6 (six) hours as needed for muscle spasms. 90 tablet 2   Multiple Vitamin (MULTIVITAMIN WITH MINERALS) TABS tablet Take 1 tablet by mouth daily.     traZODone (DESYREL) 50 MG tablet Take 0.5-1 tablets (25-50 mg total) by mouth at bedtime as needed for sleep. 30 tablet 3   No facility-administered medications prior to visit.    No Known Allergies  Review of Systems  Constitutional: Negative.   Respiratory: Negative.    Cardiovascular: Negative.   Skin:  Positive for rash.      Objective:    Physical Exam Constitutional:      Appearance: Normal appearance.  Cardiovascular:     Rate and Rhythm: Normal rate and regular rhythm.     Pulses: Normal pulses.     Heart sounds: Normal heart sounds.  Pulmonary:     Effort: Pulmonary effort is normal.     Breath sounds: Normal breath sounds.  Skin:    Findings: Rash present.     Comments: Scattered erythematous papules to arms, legs, and torso  Neurological:     Mental Status: She is alert.    BP 117/74 (BP Location: Left Arm, Patient Position: Sitting, Cuff Size: Large)   Pulse 87   Temp (!) 97.5 F (36.4 C) (Temporal)   Ht 5' 2.5" (1.588 m)   Wt 184 lb (83.5 kg)   LMP 10/26/2016 (Exact Date)   SpO2 96%   BMI 33.12 kg/m  Wt Readings from Last 3 Encounters:  12/13/20 184 lb (83.5 kg)  11/01/20 183 lb 1.9 oz (83.1 kg)  09/27/20 183 lb (83 kg)    There are no preventive care reminders to display for this patient.   There are no preventive care reminders to display for this patient.   Lab Results  Component Value Date   TSH 0.63 12/10/2017   Lab Results  Component Value Date   WBC 5.0 07/14/2020   HGB 12.7 07/14/2020   HCT 37.1 07/14/2020   MCV 89.6 07/14/2020   PLT 241 07/14/2020   Lab Results  Component Value Date    NA 139 07/14/2020   K 4.3 07/14/2020   CO2 30 07/14/2020   GLUCOSE 92 07/14/2020   BUN 13 07/14/2020   CREATININE 0.98 07/14/2020   BILITOT 0.5 07/14/2020   ALKPHOS 77 01/20/2020   AST 25 07/14/2020   ALT 18 07/14/2020   PROT 7.6 07/14/2020   ALBUMIN 4.0 01/20/2020   CALCIUM 9.6 07/14/2020  ANIONGAP 11 01/20/2020   Lab Results  Component Value Date   CHOL 141 01/11/2020   Lab Results  Component Value Date   HDL 47 (L) 01/11/2020   Lab Results  Component Value Date   LDLCALC 76 01/11/2020   Lab Results  Component Value Date   TRIG 101 01/11/2020   Lab Results  Component Value Date   CHOLHDL 3.0 01/11/2020   Lab Results  Component Value Date   HGBA1C 6.2 (H) 07/14/2020       Assessment & Plan:   Problem List Items Addressed This Visit       Musculoskeletal and Integument   Rash    -likely contact dermatitis after changing detergents -we discussed changing back to her original detergent -IM depomedrol today -Rx. Triamcinolone cream         Meds ordered this encounter  Medications   triamcinolone cream (KENALOG) 0.1 %    Sig: Apply 1 application topically 2 (two) times daily.    Dispense:  30 g    Refill:  0      Noreene Larsson, NP

## 2020-12-13 NOTE — Assessment & Plan Note (Signed)
-  likely contact dermatitis after changing detergents -we discussed changing back to her original detergent -IM depomedrol today -Rx. Triamcinolone cream

## 2020-12-13 NOTE — Patient Instructions (Signed)
For your rash, avoid using the detergent that may have caused this, Go back to your original detergent or swap to a free and clear detergent without dyes and fragrance.  If your symptoms are not improved in a week, or they get worse, please return to the clinic.

## 2020-12-21 ENCOUNTER — Telehealth: Payer: Self-pay | Admitting: *Deleted

## 2020-12-21 NOTE — Telephone Encounter (Signed)
Pt was seen 12-13-20 for bug bite. She said she goes to bed and wakes up and she is still having these bites. She wanted to know if she needed blood work drawn or if something could be done about this. Let her know provider was out of office and it would be 12-22-20 before response was given

## 2020-12-22 NOTE — Telephone Encounter (Signed)
She was seen for rash at last OV, and stated that it started after changing detergent. Now, it is bug bites. She should lift her mattress and check for bed bugs. We can meet back up for an appointment next week to determine which anti-bug medication we should use.

## 2020-12-27 ENCOUNTER — Encounter: Payer: Self-pay | Admitting: Family Medicine

## 2020-12-27 ENCOUNTER — Other Ambulatory Visit: Payer: Self-pay

## 2020-12-27 ENCOUNTER — Ambulatory Visit (INDEPENDENT_AMBULATORY_CARE_PROVIDER_SITE_OTHER): Payer: 59 | Admitting: Family Medicine

## 2020-12-27 VITALS — BP 125/80 | HR 108 | Temp 98.6°F | Resp 20 | Ht 62.5 in | Wt 183.0 lb

## 2020-12-27 DIAGNOSIS — E559 Vitamin D deficiency, unspecified: Secondary | ICD-10-CM

## 2020-12-27 DIAGNOSIS — E669 Obesity, unspecified: Secondary | ICD-10-CM

## 2020-12-27 DIAGNOSIS — R1319 Other dysphagia: Secondary | ICD-10-CM | POA: Diagnosis not present

## 2020-12-27 DIAGNOSIS — K219 Gastro-esophageal reflux disease without esophagitis: Secondary | ICD-10-CM | POA: Diagnosis not present

## 2020-12-27 DIAGNOSIS — M25511 Pain in right shoulder: Secondary | ICD-10-CM

## 2020-12-27 DIAGNOSIS — I10 Essential (primary) hypertension: Secondary | ICD-10-CM

## 2020-12-27 DIAGNOSIS — R7301 Impaired fasting glucose: Secondary | ICD-10-CM

## 2020-12-27 DIAGNOSIS — E782 Mixed hyperlipidemia: Secondary | ICD-10-CM

## 2020-12-27 DIAGNOSIS — R21 Rash and other nonspecific skin eruption: Secondary | ICD-10-CM

## 2020-12-27 DIAGNOSIS — R7303 Prediabetes: Secondary | ICD-10-CM

## 2020-12-27 DIAGNOSIS — G8929 Other chronic pain: Secondary | ICD-10-CM | POA: Insufficient documentation

## 2020-12-27 MED ORDER — PREDNISONE 5 MG (21) PO TBPK: 5.0000 mg | ORAL_TABLET | ORAL | 0 refills | Status: DC

## 2020-12-27 MED ORDER — CEPHALEXIN 500 MG PO CAPS: 500.0000 mg | ORAL_CAPSULE | Freq: Two times a day (BID) | ORAL | 0 refills | Status: DC

## 2020-12-27 NOTE — Progress Notes (Signed)
[facility]   Jacqueline [name]     MRN: [id number]      DOB: [birth year]   HPI Jacqueline [name] is here f with a 2 week h/o sporadic rash after using a new detergent, red, no drainage, no fever, occurred over left  eye, then back of neck,  then stomach and leg, new in past 4 days larger area on left posterior shoulder , no other family member or close contact has similar rash Feels well otherwise, no myalgia , normal appetite C/o right shoulder pain with limitation in mobility  ROS Denies recent fever or chills. Denies sinus pressure, nasal congestion, ear pain or sore throat. Denies chest congestion, productive cough or wheezing. Denies chest pains, palpitations and leg swelling Denies abdominal pain, nausea, vomiting,diarrhea or constipation.   Denies dysuria, frequency, hesitancy or incontinence.  Denies headaches, seizures, numbness, or tingling. Denies depression, anxiety or insomnia.   PE  BP 125/80 (BP Location: Right Arm, Patient Position: Sitting, Cuff Size: Large)   Pulse (!) 108   Temp 98.6 F (37 C)   Resp 20   Ht 5' 2.5" (1.588 m)   Wt 183 lb (83 kg)   LMP 10/26/2016 (Exact Date)   SpO2 94%   BMI 32.94 kg/m   Patient alert and oriented and in no cardiopulmonary distress.  HEENT: No facial asymmetry, EOMI,     Neck supple .  Chest: Clear to auscultation bilaterally.  CVS: S1, S2 no murmurs, no S3.Regular rate.  ABD: Soft non tender.   Ext: No edema  MS: Adequate ROM spine, , hips and knees.Reduced in right shoulder  Skin: macular rash circular on upper and lower extremities and trunk, hyperpigmented with eryhtems, no purulent drainage  Psych: Good eye contact, normal affect. Memory intact not anxious or depressed appearing.  CNS: CN 2-12 intact, power,  normal throughout.no focal deficits noted.   Assessment & Plan  Rash Diffuse macular rash affecting both upper and lower extremities and chest. Trigger felt to be detergent, rash is persisting and continuing  to appear despite not having used the product for over 1 week Mild erythema as patient has been scratching, likely bacterial superinfection. Keflex and short course of prednisone prescribed also referred to Dermatology, basic labs obtained  Right shoulder pain Chronic with reduced mobility, no recent trauma , ortho to eval  Obesity, Class I, BMI 30.0-34.9 (see actual BMI)  Patient re-educated about  the importance of commitment to a  minimum of 150 minutes of exercise per week as able.  The importance of healthy food choices with portion control discussed, as well as eating regularly and within a 12 hour window most days. The need to choose "clean , green" food 50 to 75% of the time is discussed, as well as to make water the primary drink and set a goal of 64 ounces water daily.    Weight /BMI 12/27/2020 12/13/2020 11/01/2020  WEIGHT 183 lb 184 lb 183 lb 1.9 oz  HEIGHT 5' 2.5" 5' 2.5" 5\' 2"   BMI 32.94 kg/m2 33.12 kg/m2 33.49 kg/m2      Hypertension DASH diet and commitment to daily physical activity for a minimum of 30 minutes discussed and encouraged, as a part of hypertension management. The importance of attaining a healthy weight is also discussed.  BP/Weight 12/27/2020 12/13/2020 11/01/2020 09/27/2020 07/14/2020 04/04/2020 9/76/7341  Systolic BP 937 902 409 735 329 924 268  Diastolic BP 80 74 78 80 78 70 86  Wt. (Lbs) 183 184 183.12 183 180  179.8 180  BMI 32.94 33.12 33.49 32.94 32.4 32.36 32.4   Controlled, no change in medication     Borderline diabetes Patient educated about the importance of limiting  Carbohydrate intake , the need to commit to daily physical activity for a minimum of 30 minutes , and to commit weight loss. The fact that changes in all these areas will reduce or eliminate all together the development of diabetes is stressed.  Slightly worsened, needs to reduce carbs and sugar  Diabetic Labs Latest Ref Rng & Units 12/29/2020 07/14/2020 01/20/2020 01/11/2020 07/28/2019   HbA1c 4.8 - 5.6 % 6.3(H) 6.2(H) - 6.2(H) -  Chol 100 - 199 mg/dL 168 - - 141 -  HDL >39 mg/dL 57 - - 47(L) -  Calc LDL 0 - 99 mg/dL 100(H) - - 76 -  Triglycerides 0 - 149 mg/dL 56 - - 101 -  Creatinine 0.57 - 1.00 mg/dL 1.02(H) 0.98 0.85 0.95 0.90   BP/Weight 12/27/2020 12/13/2020 11/01/2020 09/27/2020 07/14/2020 04/04/2020 4/76/5465  Systolic BP 035 465 681 275 170 017 494  Diastolic BP 80 74 78 80 78 70 86  Wt. (Lbs) 183 184 183.12 183 180 179.8 180  BMI 32.94 33.12 33.49 32.94 32.4 32.36 32.4   No flowsheet data found.    GERD (gastroesophageal reflux disease) Reports solid dysphagia and uncontroled rflux symptoms, refer GI  Hyperlipidemia Hyperlipidemia:Low fat diet discussed and encouraged.   Lipid Panel  Lab Results  Component Value Date   CHOL 168 12/29/2020   HDL 57 12/29/2020   LDLCALC 100 (H) 12/29/2020   TRIG 56 12/29/2020   CHOLHDL 2.9 12/29/2020   Needs o lower fat intake

## 2020-12-27 NOTE — Patient Instructions (Signed)
Follow-up with Dr. Posey Pronto in 2 to 3 months call if you need to be seen sooner.  A 6-day course of prednisone is prescribed for your rash and you are also referred to dermatology.  Short course of antibiotic Keflex is also prescribed.  Please get fasting labs already ordered this week.  Nurse please add to the labs vitamin D and TSH.  You are referred to orthopedics re  right shoulder pain.  You are referred to gastroenterology re  difficulty swallowing solid foods and reflux.  It is important that you exercise regularly at least 30 minutes 5 times a week. If you develop chest pain, have severe difficulty breathing, or feel very tired, stop exercising immediately and seek medical attention  Thanks for choosing [facility], we consider it a privelige to serve you.

## 2020-12-30 LAB — CMP14+EGFR
ALT: 17 IU/L (ref 0–32)
AST: 24 IU/L (ref 0–40)
Albumin/Globulin Ratio: 1.4 (ref 1.2–2.2)
Albumin: 4.3 g/dL (ref 3.8–4.9)
Alkaline Phosphatase: 105 IU/L (ref 44–121)
BUN/Creatinine Ratio: 20 (ref 9–23)
BUN: 20 mg/dL (ref 6–24)
Bilirubin Total: 0.5 mg/dL (ref 0.0–1.2)
CO2: 23 mmol/L (ref 20–29)
Calcium: 9.4 mg/dL (ref 8.7–10.2)
Chloride: 102 mmol/L (ref 96–106)
Creatinine, Ser: 1.02 mg/dL — ABNORMAL HIGH (ref 0.57–1.00)
Globulin, Total: 3.1 g/dL (ref 1.5–4.5)
Glucose: 85 mg/dL (ref 65–99)
Potassium: 3.8 mmol/L (ref 3.5–5.2)
Sodium: 139 mmol/L (ref 134–144)
Total Protein: 7.4 g/dL (ref 6.0–8.5)
eGFR: 64 mL/min/{1.73_m2} (ref >59)

## 2020-12-30 LAB — CBC
Hematocrit: 35.9 % (ref 34.0–46.6)
Hemoglobin: 12.3 g/dL (ref 11.1–15.9)
MCH: 31 pg (ref 26.6–33.0)
MCHC: 34.3 g/dL (ref 31.5–35.7)
MCV: 90 fL (ref 79–97)
Platelets: 206 10*3/uL (ref 150–450)
RBC: 3.97 x10E6/uL (ref 3.77–5.28)
RDW: 13.2 % (ref 11.7–15.4)
WBC: 13.3 10*3/uL — ABNORMAL HIGH (ref 3.4–10.8)

## 2020-12-30 LAB — LIPID PANEL
Chol/HDL Ratio: 2.9 ratio (ref 0.0–4.4)
Cholesterol, Total: 168 mg/dL (ref 100–199)
HDL: 57 mg/dL (ref >39)
LDL Chol Calc (NIH): 100 mg/dL — ABNORMAL HIGH (ref 0–99)
Triglycerides: 56 mg/dL (ref 0–149)
VLDL Cholesterol Cal: 11 mg/dL (ref 5–40)

## 2020-12-30 LAB — HEMOGLOBIN A1C
Est. average glucose Bld gHb Est-mCnc: 134 mg/dL
Hgb A1c MFr Bld: 6.3 % — ABNORMAL HIGH (ref 4.8–5.6)

## 2020-12-30 LAB — VITAMIN D 25 HYDROXY (VIT D DEFICIENCY, FRACTURES): Vit D, 25-Hydroxy: 53.4 ng/mL (ref 30.0–100.0)

## 2020-12-30 LAB — TSH: TSH: 0.805 u[IU]/mL (ref 0.450–4.500)

## 2020-12-31 ENCOUNTER — Encounter: Payer: Self-pay | Admitting: Family Medicine

## 2020-12-31 NOTE — Assessment & Plan Note (Signed)
Chronic with reduced mobility, no recent trauma , ortho to eval

## 2020-12-31 NOTE — Assessment & Plan Note (Signed)
Reports solid dysphagia and uncontroled rflux symptoms, refer GI

## 2020-12-31 NOTE — Assessment & Plan Note (Signed)
DASH diet and commitment to daily physical activity for a minimum of 30 minutes discussed and encouraged, as a part of hypertension management. The importance of attaining a healthy weight is also discussed.  BP/Weight 12/27/2020 12/13/2020 11/01/2020 09/27/2020 07/14/2020 04/04/2020 [id number]  Systolic BP 0000000 [id number] Q000111Q 0000000 0000000 [id number]  Diastolic BP 80 74 78 80 78 70 86  Wt. (Lbs) 183 184 183.12 183 180 179.8 180  BMI 32.94 33.12 33.49 32.94 32.4 32.36 32.4   Controlled, no change in medication

## 2020-12-31 NOTE — Assessment & Plan Note (Signed)
Patient educated about the importance of limiting  Carbohydrate intake , the need to commit to daily physical activity for a minimum of 30 minutes , and to commit weight loss. The fact that changes in all these areas will reduce or eliminate all together the development of diabetes is stressed.  Slightly worsened, needs to reduce carbs and sugar  Diabetic Labs Latest Ref Rng & Units 12/29/2020 07/14/2020 01/20/2020 01/11/2020 07/28/2019  HbA1c 4.8 - 5.6 % 6.3(H) 6.2(H) - 6.2(H) -  Chol 100 - 199 mg/dL 168 - - 141 -  HDL >39 mg/dL 57 - - 47(L) -  Calc LDL 0 - 99 mg/dL 100(H) - - 76 -  Triglycerides 0 - 149 mg/dL 56 - - 101 -  Creatinine 0.57 - 1.00 mg/dL 1.02(H) 0.98 0.85 0.95 0.90   BP/Weight 12/27/2020 12/13/2020 11/01/2020 09/27/2020 07/14/2020 04/04/2020 [id number]  Systolic BP 0000000 [id number] Q000111Q 0000000 0000000 [id number]  Diastolic BP 80 74 78 80 78 70 86  Wt. (Lbs) 183 184 183.12 183 180 179.8 180  BMI 32.94 33.12 33.49 32.94 32.4 32.36 32.4   No flowsheet data found.

## 2020-12-31 NOTE — Assessment & Plan Note (Signed)
Hyperlipidemia:Low fat diet discussed and encouraged.   Lipid Panel  Lab Results  Component Value Date   CHOL 168 12/29/2020   HDL 57 12/29/2020   LDLCALC 100 (H) 12/29/2020   TRIG 56 12/29/2020   CHOLHDL 2.9 12/29/2020   Needs o lower fat intake

## 2020-12-31 NOTE — Assessment & Plan Note (Signed)
Diffuse macular rash affecting both upper and lower extremities and chest. Trigger felt to be detergent, rash is persisting and continuing to appear despite not having used the product for over 1 week Mild erythema as patient has been scratching, likely bacterial superinfection. Keflex and short course of prednisone prescribed also referred to Dermatology, basic labs obtained

## 2020-12-31 NOTE — Assessment & Plan Note (Signed)
  Patient re-educated about  the importance of commitment to a  minimum of 150 minutes of exercise per week as able.  The importance of healthy food choices with portion control discussed, as well as eating regularly and within a 12 hour window most days. The need to choose "clean , green" food 50 to 75% of the time is discussed, as well as to make water the primary drink and set a goal of 64 ounces water daily.    Weight /BMI 12/27/2020 12/13/2020 11/01/2020  WEIGHT 183 lb 184 lb 183 lb 1.9 oz  HEIGHT 5' 2.5" 5' 2.5" '5\' 2"'$   BMI 32.94 kg/m2 33.12 kg/m2 33.49 kg/m2

## 2021-01-01 ENCOUNTER — Encounter: Payer: Self-pay | Admitting: Internal Medicine

## 2021-01-02 NOTE — Progress Notes (Signed)
Pt informed

## 2021-01-04 ENCOUNTER — Encounter: Payer: Self-pay | Admitting: Orthopaedic Surgery

## 2021-01-04 ENCOUNTER — Other Ambulatory Visit: Payer: Self-pay

## 2021-01-04 ENCOUNTER — Ambulatory Visit (INDEPENDENT_AMBULATORY_CARE_PROVIDER_SITE_OTHER): Payer: 59 | Admitting: Orthopaedic Surgery

## 2021-01-04 ENCOUNTER — Ambulatory Visit: Payer: 59

## 2021-01-04 VITALS — BP 121/82 | HR 78 | Ht 62.5 in | Wt 184.0 lb

## 2021-01-04 DIAGNOSIS — M25511 Pain in right shoulder: Secondary | ICD-10-CM

## 2021-01-04 DIAGNOSIS — G8929 Other chronic pain: Secondary | ICD-10-CM | POA: Diagnosis not present

## 2021-01-04 NOTE — Progress Notes (Signed)
Subjective:    Patient ID: Jacqueline [name], female    DOB: [birth year], 60 y.o.   MRN: [id number]  HPI She has had pain in the right shoulder for the last several weeks.  It hurts to raise up over head.  It hurts to lay on it at night. She has no trauma, no swelling, no numbness.  She was seen at [facility] 12-27-20.  I have reviewed the notes.  She is not improving. She has tried Tylenol, ice and heat with no help.  She has no paresthesias.  She has no neck pain.  She has no other joint pain.   Review of Systems  Constitutional:  Positive for activity change.  Musculoskeletal:  Positive for arthralgias and myalgias.  All other systems reviewed and are negative. For Review of Systems, all other systems reviewed and are negative.  The following is a summary of the past history medically, past history surgically, known current medicines, social history and family history.  This information is gathered electronically by the computer from prior information and documentation.  I review this each visit and have found including this information at this point in the chart is beneficial and informative.   Past Medical History:  Diagnosis Date   Anemia    Arthritis    per patient, back   Complication of anesthesia 2018   per patient HR dropped during partial hysterectomy surgery   Fibroid uterus    GERD (gastroesophageal reflux disease)    Hyperlipidemia    Borderline   Hypertension    Numbness and tingling in right hand    Pre-diabetes    borderline    Past Surgical History:  Procedure Laterality Date   BACK SURGERY     CHOLECYSTECTOMY     COLONOSCOPY  2003   Dr. Gala Romney: Anal canal hemorrhoids   COLONOSCOPY N/A 08/25/2014   Procedure: COLONOSCOPY;  Surgeon: Daneil Dolin, MD;  Location: [facility];  Service: Endoscopy;  Laterality: N/A;  1200   ESOPHAGOGASTRODUODENOSCOPY N/A 08/25/2014   Procedure: ESOPHAGOGASTRODUODENOSCOPY (EGD);  Surgeon: Daneil Dolin, MD;   Location: [facility];  Service: Endoscopy;  Laterality: N/A;   SALPINGOOPHORECTOMY Bilateral 11/06/2016   Procedure: BILATERAL SALPINGO OOPHORECTOMY;  Surgeon: Florian Buff, MD;  Location: [facility];  Service: Gynecology;  Laterality: Bilateral;   SUPRACERVICAL ABDOMINAL HYSTERECTOMY N/A 11/06/2016   Procedure: HYSTERECTOMY SUPRACERVICAL ABDOMINAL;  Surgeon: Florian Buff, MD;  Location: [facility];  Service: Gynecology;  Laterality: N/A;   TRANSFORAMINAL LUMBAR INTERBODY FUSION (TLIF) WITH PEDICLE SCREW FIXATION 2 LEVEL Left 07/28/2019   Procedure: LEFT-SIDED LUMBAR 4- 5, LUMBAR 5 -SACRUM1 TRANSFORAMINAL LUMBAR INTERBODY FUSION WITH INSTRUMENTATION AND ALLOGRAFT;  Surgeon: Phylliss Bob, MD;  Location: [facility];  Service: Orthopedics;  Laterality: Left;   TUBAL LIGATION      Current Outpatient Medications on File Prior to Visit  Medication Sig Dispense Refill   amLODipine (NORVASC) 5 MG tablet Take 1 tablet by mouth once daily 30 tablet 0   atorvastatin (LIPITOR) 20 MG tablet Take 1 tablet (20 mg total) by mouth daily. 90 tablet 1   esomeprazole (NEXIUM) 40 MG capsule Take 1 capsule (40 mg total) by mouth daily. 30 capsule 0   estradiol (ESTRACE) 0.1 MG/GM vaginal cream INSERT 2 GRAMS VAGINALLY AS DIRECTED EVERY OTHER NIGHT 43 g 11   fluticasone (FLONASE) 50 MCG/ACT nasal spray Use 2 sprays in each nostril BID for a week. After 1 week, decrease to 1 spray in each  nostril BID as needed for congestion/allergies. 16 g 6   gabapentin (NEURONTIN) 300 MG capsule Take 1 capsule (300 mg total) by mouth at bedtime. 90 capsule 1   meloxicam (MOBIC) 7.5 MG tablet Take 1 tablet (7.5 mg total) by mouth daily. 90 tablet 1   methocarbamol (ROBAXIN) 500 MG tablet Take 1 tablet (500 mg total) by mouth every 6 (six) hours as needed for muscle spasms. 90 tablet 2   Multiple Vitamin (MULTIVITAMIN WITH MINERALS) TABS tablet Take 1 tablet by mouth daily.     traZODone (DESYREL) 50 MG tablet Take 0.5-1 tablets (25-50 mg  total) by mouth at bedtime as needed for sleep. 30 tablet 3   triamcinolone cream (KENALOG) 0.1 % Apply 1 application topically 2 (two) times daily. 30 g 0   cephALEXin (KEFLEX) 500 MG capsule Take 1 capsule (500 mg total) by mouth 2 (two) times daily. (Patient not taking: Reported on 01/04/2021) 10 capsule 0   predniSONE (STERAPRED UNI-PAK 21 TAB) 5 MG (21) TBPK tablet Take 1 tablet (5 mg total) by mouth as directed. Use as directed (Patient not taking: Reported on 01/04/2021) 21 tablet 0   No current facility-administered medications on file prior to visit.    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   Smoking status: Never   Smokeless tobacco: Never   Tobacco comments:    Never smoked  Vaping Use   Vaping Use: Never used  Substance and Sexual Activity   Alcohol use: No    Alcohol/week: 0.0 standard drinks   Drug use: No   Sexual activity: Yes    Birth control/protection: Post-menopausal, Surgical  Other Topics Concern   Not on file  Social History Narrative   Not on file   Social Determinants of Health   Financial Resource Strain: Not on file  Food Insecurity: Not on file  Transportation Needs: Not on file  Physical Activity: Not on file  Stress: Not on file  Social Connections: Not on file  Intimate Partner Violence: Not on file    Family History  Problem Relation Age of Onset   Cancer Maternal Grandmother         lymphnodes   Hypertension Maternal Aunt    Cancer Maternal Aunt        Breast   Colon cancer Neg Hx     BP 121/82   Pulse 78   Ht 5' 2.5" (1.588 m)   Wt 184 lb (83.5 kg)   LMP 10/26/2016 (Exact Date)   BMI 33.12 kg/m   Body mass index is 33.12 kg/m.     Objective:   Physical Exam Vitals and nursing note reviewed. Exam  conducted with a chaperone present.  Constitutional:      Appearance: She is well-developed.  HENT:     Head: Normocephalic and atraumatic.  Eyes:     Conjunctiva/sclera: Conjunctivae normal.     Pupils: Pupils are equal, round, and reactive to light.  Cardiovascular:     Rate and Rhythm: Normal rate and regular rhythm.  Pulmonary:     Effort: Pulmonary effort is normal.  Abdominal:     Palpations: Abdomen is soft.  Musculoskeletal:       Arms:  Cervical back: Normal range of motion and neck supple.  Skin:    General: Skin is warm and dry.  Neurological:     Mental Status: She is alert and oriented to person, place, and time.     Cranial Nerves: No cranial nerve deficit.     Motor: No abnormal muscle tone.     Coordination: Coordination normal.     Deep Tendon Reflexes: Reflexes are normal and symmetric. Reflexes normal.  Psychiatric:        Behavior: Behavior normal.        Thought Content: Thought content normal.        Judgment: Judgment normal.  X-rays were done of the right shoulder, reported separately.        Assessment & Plan:   Encounter Diagnosis  Name Primary?   Chronic right shoulder pain Yes   PROCEDURE NOTE:  The patient request injection, verbal consent was obtained.  The right shoulder was prepped appropriately after time out was performed.   Sterile technique was observed and injection of 1 cc of Celestone 6 mg with several cc's of plain xylocaine. Anesthesia was provided by ethyl chloride and a 20-gauge needle was used to inject the shoulder area. A posterior approach was used.  The injection was tolerated well.  A band aid dressing was applied.  The patient was advised to apply ice later today and tomorrow to the injection sight as needed.   Sheet of exercises was given.  Take one Aleve twice a day.  Return in three weeks.  Call if any problem.  Precautions discussed.  Electronically Signed Sanjuana Kava, MD 7/28/202210:53 AM

## 2021-01-04 NOTE — Patient Instructions (Signed)

## 2021-01-04 NOTE — Progress Notes (Signed)
G

## 2021-01-11 ENCOUNTER — Ambulatory Visit (INDEPENDENT_AMBULATORY_CARE_PROVIDER_SITE_OTHER): Payer: 59 | Admitting: Gastroenterology

## 2021-01-15 ENCOUNTER — Other Ambulatory Visit: Payer: Self-pay | Admitting: Nurse Practitioner

## 2021-01-15 DIAGNOSIS — I10 Essential (primary) hypertension: Secondary | ICD-10-CM

## 2021-01-25 ENCOUNTER — Ambulatory Visit: Payer: 59 | Admitting: Orthopaedic Surgery

## 2021-02-14 ENCOUNTER — Ambulatory Visit: Payer: 59 | Admitting: Nurse Practitioner

## 2021-02-27 ENCOUNTER — Ambulatory Visit: Payer: 59 | Admitting: Internal Medicine

## 2021-03-01 ENCOUNTER — Telehealth: Payer: Self-pay

## 2021-03-01 ENCOUNTER — Other Ambulatory Visit: Payer: Self-pay

## 2021-03-01 DIAGNOSIS — K219 Gastro-esophageal reflux disease without esophagitis: Secondary | ICD-10-CM

## 2021-03-01 MED ORDER — ESOMEPRAZOLE MAGNESIUM 40 MG PO CPDR: 40.0000 mg | DELAYED_RELEASE_CAPSULE | Freq: Every day | ORAL | 0 refills | Status: DC

## 2021-03-01 NOTE — Telephone Encounter (Signed)
Patient called need med refill esomeprazole (NEXIUM) 40 MG capsule  Pharmacy: [facility]

## 2021-03-01 NOTE — Telephone Encounter (Signed)
Rx sent 

## 2021-03-15 ENCOUNTER — Other Ambulatory Visit: Payer: Self-pay | Admitting: Obstetrics & Gynecology

## 2021-04-04 ENCOUNTER — Other Ambulatory Visit: Payer: Self-pay | Admitting: Internal Medicine

## 2021-04-04 ENCOUNTER — Other Ambulatory Visit: Payer: Self-pay

## 2021-04-04 DIAGNOSIS — G8929 Other chronic pain: Secondary | ICD-10-CM

## 2021-04-05 ENCOUNTER — Other Ambulatory Visit: Payer: Self-pay | Admitting: Nurse Practitioner

## 2021-04-05 DIAGNOSIS — K219 Gastro-esophageal reflux disease without esophagitis: Secondary | ICD-10-CM

## 2021-04-10 ENCOUNTER — Other Ambulatory Visit: Payer: Self-pay | Admitting: *Deleted

## 2021-04-10 ENCOUNTER — Telehealth: Payer: Self-pay | Admitting: Nurse Practitioner

## 2021-04-10 MED ORDER — ATORVASTATIN CALCIUM 20 MG PO TABS: 20.0000 mg | ORAL_TABLET | Freq: Every day | ORAL | 1 refills | Status: DC

## 2021-04-10 NOTE — Telephone Encounter (Signed)
Rx was sent into pharmacy.  

## 2021-04-10 NOTE — Telephone Encounter (Signed)
Pt called in for refill on Astrovastin

## 2021-05-01 ENCOUNTER — Encounter: Payer: Self-pay | Admitting: Gastroenterology

## 2021-05-01 ENCOUNTER — Ambulatory Visit (INDEPENDENT_AMBULATORY_CARE_PROVIDER_SITE_OTHER): Payer: 59 | Admitting: Gastroenterology

## 2021-05-01 ENCOUNTER — Other Ambulatory Visit: Payer: Self-pay

## 2021-05-01 VITALS — BP 122/79 | HR 103 | Temp 97.0°F | Ht 63.0 in | Wt 179.0 lb

## 2021-05-01 DIAGNOSIS — R1319 Other dysphagia: Secondary | ICD-10-CM

## 2021-05-01 DIAGNOSIS — R1310 Dysphagia, unspecified: Secondary | ICD-10-CM | POA: Insufficient documentation

## 2021-05-01 DIAGNOSIS — K219 Gastro-esophageal reflux disease without esophagitis: Secondary | ICD-10-CM

## 2021-05-01 NOTE — Patient Instructions (Addendum)
Continue esomeprazole 40mg  daily, you can take in the evening but you may do better if you take 30 minutes before your last meal of the day.  You can use over the counter antacids for any breakthrough acid reflux.  Upper endoscopy to be scheduled. See separate instructions.  For constipation, you can take OTC Miralax one capful daily as needed. Miralax will not work same day so you need to keep it in your system as you need it. If you do not have a bowel movement for more than 24 hours, go ahead and take a dose. If you do have a good bowel movement, you can skip the dose that day. You may find that you need every day or several times per week. Whichever works best for you.   Gastroesophageal Reflux Disease, Adult Gastroesophageal reflux (GER) happens when acid from the stomach flows up into the tube that connects the mouth and the stomach (esophagus). Normally, food travels down the esophagus and stays in the stomach to be digested. However, when a person has GER, food and stomach acid sometimes move back up into the esophagus. If this becomes a more serious problem, the person may be diagnosed with a disease called gastroesophageal reflux disease (GERD). GERD occurs when the reflux: Happens often. Causes frequent or severe symptoms. Causes problems such as damage to the esophagus. When stomach acid comes in contact with the esophagus, the acid may cause inflammation in the esophagus. Over time, GERD may create small holes (ulcers) in the lining of the esophagus. What are the causes? This condition is caused by a problem with the muscle between the esophagus and the stomach (lower esophageal sphincter, or LES). Normally, the LES muscle closes after food passes through the esophagus to the stomach. When the LES is weakened or abnormal, it does not close properly, and that allows food and stomach acid to go back up into the esophagus. The LES can be weakened by certain dietary substances, medicines, and  medical conditions, including: Tobacco use. Pregnancy. Having a hiatal hernia. Alcohol use. Certain foods and beverages, such as coffee, chocolate, onions, and peppermint. What increases the risk? You are more likely to develop this condition if you: Have an increased body weight. Have a connective tissue disorder. Take NSAIDs, such as ibuprofen. What are the signs or symptoms? Symptoms of this condition include: Heartburn. Difficult or painful swallowing and the feeling of having a lump in the throat. A bitter taste in the mouth. Bad breath and having a large amount of saliva. Having an upset or bloated stomach and belching. Chest pain. Different conditions can cause chest pain. Make sure you see your health care provider if you experience chest pain. Shortness of breath or wheezing. Ongoing (chronic) cough or a nighttime cough. Wearing away of tooth enamel. Weight loss. How is this diagnosed? This condition may be diagnosed based on a medical history and a physical exam. To determine if you have mild or severe GERD, your health care provider may also monitor how you respond to treatment. You may also have tests, including: A test to examine your stomach and esophagus with a small camera (endoscopy). A test that measures the acidity level in your esophagus. A test that measures how much pressure is on your esophagus. A barium swallow or modified barium swallow test to show the shape, size, and functioning of your esophagus. How is this treated? Treatment for this condition may vary depending on how severe your symptoms are. Your health care provider may  recommend: Changes to your diet. Medicine. Surgery. The goal of treatment is to help relieve your symptoms and to prevent complications. Follow these instructions at home: Eating and drinking  Follow a diet as recommended by your health care provider. This may involve avoiding foods and drinks such as: Coffee and tea, with or  without caffeine. Drinks that contain alcohol. Energy drinks and sports drinks. Carbonated drinks or sodas. Chocolate and cocoa. Peppermint and mint flavorings. Garlic and onions. Horseradish. Spicy and acidic foods, including peppers, chili powder, curry powder, vinegar, hot sauces, and barbecue sauce. Citrus fruit juices and citrus fruits, such as oranges, lemons, and limes. Tomato-based foods, such as red sauce, chili, salsa, and pizza with red sauce. Fried and fatty foods, such as donuts, french fries, potato chips, and high-fat dressings. High-fat meats, such as hot dogs and fatty cuts of red and white meats, such as rib eye steak, sausage, ham, and bacon. High-fat dairy items, such as whole milk, butter, and cream cheese. Eat small, frequent meals instead of large meals. Avoid drinking large amounts of liquid with your meals. Avoid eating meals during the 2-3 hours before bedtime. Avoid lying down right after you eat. Do not exercise right after you eat. Lifestyle  Do not use any products that contain nicotine or tobacco. These products include cigarettes, chewing tobacco, and vaping devices, such as e-cigarettes. If you need help quitting, ask your health care provider. Try to reduce your stress by using methods such as yoga or meditation. If you need help reducing stress, ask your health care provider. If you are overweight, reduce your weight to an amount that is healthy for you. Ask your health care provider for guidance about a safe weight loss goal. General instructions Pay attention to any changes in your symptoms. Take over-the-counter and prescription medicines only as told by your health care provider. Do not take aspirin, ibuprofen, or other NSAIDs unless your health care provider told you to take these medicines. Wear loose-fitting clothing. Do not wear anything tight around your waist that causes pressure on your abdomen. Raise (elevate) the head of your bed about 6  inches (15 cm). You can use a wedge to do this. Avoid bending over if this makes your symptoms worse. Keep all follow-up visits. This is important. Contact a health care provider if: You have: New symptoms. Unexplained weight loss. Difficulty swallowing or it hurts to swallow. Wheezing or a persistent cough. A hoarse voice. Your symptoms do not improve with treatment. Get help right away if: You have sudden pain in your arms, neck, jaw, teeth, or back. You suddenly feel sweaty, dizzy, or light-headed. You have chest pain or shortness of breath. You vomit and the vomit is green, yellow, or black, or it looks like blood or coffee grounds. You faint. You have stool that is red, bloody, or black. You cannot swallow, drink, or eat. These symptoms may represent a serious problem that is an emergency. Do not wait to see if the symptoms will go away. Get medical help right away. Call your local emergency services (911 in the [place] Do not drive yourself to the hospital. Summary Gastroesophageal reflux happens when acid from the stomach flows up into the esophagus. GERD is a disease in which the reflux happens often, causes frequent or severe symptoms, or causes problems such as damage to the esophagus. Treatment for this condition may vary depending on how severe your symptoms are. Your health care provider may recommend diet and lifestyle changes, medicine, or  surgery. Contact a health care provider if you have new or worsening symptoms. Take over-the-counter and prescription medicines only as told by your health care provider. Do not take aspirin, ibuprofen, or other NSAIDs unless your health care provider told you to do so. Keep all follow-up visits as told by your health care provider. This is important. This information is not intended to replace advice given to you by your health care provider. Make sure you discuss any questions you have with your health care provider. Document Revised:  12/06/2019 Document Reviewed: 12/06/2019 Elsevier Patient Education  2022 [place] for Gastroesophageal Reflux Disease, Adult When you have gastroesophageal reflux disease (GERD), the foods you eat and your eating habits are very important. Choosing the right foods can help ease the discomfort of GERD. Consider working with a dietitian to help you make healthy food choices. What are tips for following this plan? Reading food labels Look for foods that are low in saturated fat. Foods that have less than 5% of daily value (DV) of fat and 0 g of trans fats may help with your symptoms. Cooking Cook foods using methods other than frying. This may include baking, steaming, grilling, or broiling. These are all methods that do not need a lot of fat for cooking. To add flavor, try to use herbs that are low in spice and acidity. Meal planning  Choose healthy foods that are low in fat, such as fruits, vegetables, whole grains, low-fat dairy products, lean meats, fish, and poultry. Eat frequent, small meals instead of three large meals each day. Eat your meals slowly, in a relaxed setting. Avoid bending over or lying down until 2-3 hours after eating. Limit high-fat foods such as fatty meats or fried foods. Limit your intake of fatty foods, such as oils, butter, and shortening. Avoid the following as told by your health care provider: Foods that cause symptoms. These may be different for different people. Keep a food diary to keep track of foods that cause symptoms. Alcohol. Drinking large amounts of liquid with meals. Eating meals during the 2-3 hours before bed. Lifestyle Maintain a healthy weight. Ask your health care provider what weight is healthy for you. If you need to lose weight, work with your health care provider to do so safely. Exercise for at least 30 minutes on 5 or more days each week, or as told by your health care provider. Avoid wearing clothes that fit tightly  around your waist and chest. Do not use any products that contain nicotine or tobacco. These products include cigarettes, chewing tobacco, and vaping devices, such as e-cigarettes. If you need help quitting, ask your health care provider. Sleep with the head of your bed raised. Use a wedge under the mattress or blocks under the bed frame to raise the head of the bed. Chew sugar-free gum after mealtimes. What foods should I eat? Eat a healthy, well-balanced diet of fruits, vegetables, whole grains, low-fat dairy products, lean meats, fish, and poultry. Each person is different. Foods that may trigger symptoms in one person may not trigger any symptoms in another person. Work with your health care provider to identify foods that are safe for you. The items listed above may not be a complete list of recommended foods and beverages. Contact a dietitian for more information. What foods should I avoid? Limiting some of these foods may help manage the symptoms of GERD. Everyone is different. Consult a dietitian or your health care provider to help  you identify the exact foods to avoid, if any. Fruits Any fruits prepared with added fat. Any fruits that cause symptoms. For some people this may include citrus fruits, such as oranges, grapefruit, pineapple, and lemons. Vegetables Deep-fried vegetables. [place] fries. Any vegetables prepared with added fat. Any vegetables that cause symptoms. For some people, this may include tomatoes and tomato products, chili peppers, onions and garlic, and horseradish. Grains Pastries or quick breads with added fat. Meats and other proteins High-fat meats, such as fatty beef or pork, hot dogs, ribs, ham, sausage, salami, and bacon. Fried meat or protein, including fried fish and fried chicken. Nuts and nut butters, in large amounts. Dairy Whole milk and chocolate milk. Sour cream. Cream. Ice cream. Cream cheese. Milkshakes. Fats and oils Butter. Margarine. Shortening.  Ghee. Beverages Coffee and tea, with or without caffeine. Carbonated beverages. Sodas. Energy drinks. Fruit juice made with acidic fruits, such as orange or grapefruit. Tomato juice. Alcoholic drinks. Sweets and desserts Chocolate and cocoa. Donuts. Seasonings and condiments Pepper. Peppermint and spearmint. Added salt. Any condiments, herbs, or seasonings that cause symptoms. For some people, this may include curry, hot sauce, or vinegar-based salad dressings. The items listed above may not be a complete list of foods and beverages to avoid. Contact a dietitian for more information. Questions to ask your health care provider Diet and lifestyle changes are usually the first steps that are taken to manage symptoms of GERD. If diet and lifestyle changes do not improve your symptoms, talk with your health care provider about taking medicines. Where to find more information International Foundation for Gastrointestinal Disorders: aboutgerd.org Summary When you have gastroesophageal reflux disease (GERD), food and lifestyle choices may be very helpful in easing the discomfort of GERD. Eat frequent, small meals instead of three large meals each day. Eat your meals slowly, in a relaxed setting. Avoid bending over or lying down until 2-3 hours after eating. Limit high-fat foods such as fatty meats or fried foods. This information is not intended to replace advice given to you by your health care provider. Make sure you discuss any questions you have with your health care provider. Document Revised: 12/06/2019 Document Reviewed: 12/06/2019 Elsevier Patient Education  [place].

## 2021-05-01 NOTE — Progress Notes (Signed)
Primary Care Physician:  Noreene Larsson, NP  Primary Gastroenterologist:  Garfield Cornea, MD   Chief Complaint  Patient presents with   Gastroesophageal Reflux    C/o vomiting acid up, food particles also   Dysphagia     HPI:  Jacqueline [name] is a 60 y.o. female here at the request of Dr. Moshe Cipro for further evaluation of GERD, dysphagia.   Patient last seen in our practice in 2016, seen for refractory GERD, h/o LPR. EGD at that time showed mild erosive reflux esophagitis. Colonoscopy was normal. Next colonoscopy in 2026.   Today: Over several months feels like food is sticking in the throat/chest.  First couple time she noticed it when she was eating out at a restaurant, eating whitefish.  Has since been occurring at other times on a regular basis.  Denies problems swallowing liquids or pills.  She does have some burning in her throat which she contributes to heartburn.  Most of her reflux symptoms seem to be at nighttime.  Complains of frequent nocturnal regurgitation. Sometimes does extra over the counter TUMs with esomeprazole. Has been on PPI for about a year this time. More recently taking PPI at night.  Tries to wait 2 to 3 hours before laying down after a meal but sometimes she does not do this.  Denies abdominal pain. BM every 2-3 days. Sometimes little hard. No melena, brbpr.  Bone hoarseness. H/o growth on vocal cord, around age 74.    Current Outpatient Medications  Medication Sig Dispense Refill   amLODipine (NORVASC) 5 MG tablet Take 1 tablet by mouth once daily 90 tablet 1   atorvastatin (LIPITOR) 20 MG tablet Take 1 tablet (20 mg total) by mouth daily. 90 tablet 1   esomeprazole (NEXIUM) 40 MG capsule Take 1 capsule by mouth once daily 30 capsule 0   estradiol (ESTRACE) 0.1 MG/GM vaginal cream INSERT 2 GRAMS VAGINALLY AS DIRECTED EVERY OTHER NIGHT 43 g 11   fluticasone (FLONASE) 50 MCG/ACT nasal spray Use 2 sprays in each nostril BID for a week. After 1 week, decrease to 1  spray in each nostril BID as needed for congestion/allergies. 16 g 6   gabapentin (NEURONTIN) 300 MG capsule Take 1 capsule by mouth at bedtime 90 capsule 0   meloxicam (MOBIC) 7.5 MG tablet Take 1 tablet by mouth once daily 90 tablet 0   methocarbamol (ROBAXIN) 500 MG tablet Take 1 tablet (500 mg total) by mouth every 6 (six) hours as needed for muscle spasms. 90 tablet 2   Multiple Vitamin (MULTIVITAMIN WITH MINERALS) TABS tablet Take 1 tablet by mouth daily.     traZODone (DESYREL) 50 MG tablet Take 0.5-1 tablets (25-50 mg total) by mouth at bedtime as needed for sleep. 30 tablet 3   triamcinolone cream (KENALOG) 0.1 % Apply 1 application topically 2 (two) times daily. 30 g 0   No current facility-administered medications for this visit.    Allergies as of 05/01/2021   (No Known Allergies)    Past Medical History:  Diagnosis Date   Anemia    Arthritis    per patient, back   Complication of anesthesia 2018   per patient HR dropped during partial hysterectomy surgery   Fibroid uterus    GERD (gastroesophageal reflux disease)    Hyperlipidemia    Borderline   Hypertension    Numbness and tingling in right hand    Pre-diabetes    borderline    Past Surgical History:  Procedure Laterality Date  BACK SURGERY     CHOLECYSTECTOMY     COLONOSCOPY  2003   Dr. Gala Romney: Anal canal hemorrhoids   COLONOSCOPY N/A 08/25/2014   Procedure: COLONOSCOPY;  Surgeon: Daneil Dolin, MD;  Location: [facility];  Service: Endoscopy;  Laterality: N/A;  1200   ESOPHAGOGASTRODUODENOSCOPY N/A 08/25/2014   Procedure: ESOPHAGOGASTRODUODENOSCOPY (EGD);  Surgeon: Daneil Dolin, MD;  Location: [facility];  Service: Endoscopy;  Laterality: N/A;   SALPINGOOPHORECTOMY Bilateral 11/06/2016   Procedure: BILATERAL SALPINGO OOPHORECTOMY;  Surgeon: Florian Buff, MD;  Location: [facility];  Service: Gynecology;  Laterality: Bilateral;   SUPRACERVICAL ABDOMINAL HYSTERECTOMY N/A 11/06/2016   Procedure:  HYSTERECTOMY SUPRACERVICAL ABDOMINAL;  Surgeon: Florian Buff, MD;  Location: [facility];  Service: Gynecology;  Laterality: N/A;   TRANSFORAMINAL LUMBAR INTERBODY FUSION (TLIF) WITH PEDICLE SCREW FIXATION 2 LEVEL Left 07/28/2019   Procedure: LEFT-SIDED LUMBAR 4- 5, LUMBAR 5 -SACRUM1 TRANSFORAMINAL LUMBAR INTERBODY FUSION WITH INSTRUMENTATION AND ALLOGRAFT;  Surgeon: Phylliss Bob, MD;  Location: [place];  Service: Orthopedics;  Laterality: Left;   TUBAL LIGATION      Family History  Problem Relation Age of Onset   Cancer Maternal Grandmother         lymphnodes   Hypertension Maternal Aunt    Cancer Maternal Aunt        Breast   Colon cancer Neg Hx     Social History   Socioeconomic History   Marital status: Single    Spouse name: Not on file   Number of children: 3   Years of education: Not on file   Highest education level: Not on file  Occupational History   Occupation: PT at [employer]  Tobacco Use   Smoking status: Never   Smokeless tobacco: Never   Tobacco comments:    Never smoked  Vaping Use   Vaping Use: Never used  Substance and Sexual Activity   Alcohol use: No    Alcohol/week: 0.0 standard drinks   Drug use: No   Sexual activity: Yes    Birth control/protection: Post-menopausal, Surgical  Other Topics Concern   Not on file  Social History Narrative   Not on file   Social Determinants of Health   Financial Resource Strain: Not on file  Food Insecurity: Not on file  Transportation Needs: Not on file  Physical Activity: Not on file  Stress: Not on file  Social Connections: Not on file  Intimate Partner Violence: Not on file      ROS:  General: Negative for anorexia, weight loss, fever, chills, fatigue, weakness. Eyes: Negative for vision changes.  ENT: Negative for hoarseness, nasal congestion.  See HPI CV: Negative for chest pain, angina, palpitations, dyspnea on exertion, peripheral edema.  Respiratory: Negative for dyspnea at rest, dyspnea on  exertion, cough, sputum, wheezing.  GI: See history of present illness. GU:  Negative for dysuria, hematuria, urinary incontinence, urinary frequency, nocturnal urination.  MS: Negative for joint pain.  Some intermittent back pain.  Derm: Negative for rash or itching.  Neuro: Negative for weakness, abnormal sensation, seizure, frequent headaches, memory loss, confusion.  Psych: Negative for anxiety, depression, suicidal ideation, hallucinations.  Endo: Negative for unusual weight change.  Heme: Negative for bruising or bleeding. Allergy: Negative for rash or hives.    Physical Examination:  BP 122/79   Pulse (!) 103   Temp (!) 97 F (36.1 C)   Ht 5\' 3"  (1.6 m)   Wt 179 lb (81.2 kg)   LMP  10/26/2016 (Exact Date)   BMI 31.71 kg/m    General: Well-nourished, well-developed in no acute distress.  Head: Normocephalic, atraumatic.   Eyes: Conjunctiva pink, no icterus. Mouth: masked Neck: Supple without thyromegaly, masses, or lymphadenopathy.  Lungs: Clear to auscultation bilaterally.  Heart: Regular rate and rhythm, no murmurs rubs or gallops.  Abdomen: Bowel sounds are normal, nontender, nondistended, no hepatosplenomegaly or masses, no abdominal bruits or    hernia , no rebound or guarding.   Rectal: not performed Extremities: No lower extremity edema. No clubbing or deformities.  Neuro: Alert and oriented x 4 , grossly normal neurologically.  Skin: Warm and dry, no rash or jaundice.   Psych: Alert and cooperative, normal mood and affect.  Labs: Lab Results  Component Value Date   TSH 0.805 12/29/2020   Lab Results  Component Value Date   WBC 13.3 (H) 12/29/2020   HGB 12.3 12/29/2020   HCT 35.9 12/29/2020   MCV 90 12/29/2020   PLT 206 12/29/2020   Lab Results  Component Value Date   HGBA1C 6.3 (H) 12/29/2020   Lab Results  Component Value Date   CREATININE 1.02 (H) 12/29/2020   BUN 20 12/29/2020   NA 139 12/29/2020   K 3.8 12/29/2020   CL 102 12/29/2020    CO2 23 12/29/2020   Lab Results  Component Value Date   ALT 17 12/29/2020   AST 24 12/29/2020   ALKPHOS 105 12/29/2020   BILITOT 0.5 12/29/2020     Imaging Studies: No results found.   Assessment:  Pleasant 60 year old female with prior history of LPR, vocal cord growth removed around age 7 presenting with somewhat refractory reflux symptoms, nocturnal regurgitation and solid food dysphagia. Symptoms inadequately controlled on esomeprazole 40 mg daily.  Differential diagnosis includes reflux esophagitis, esophageal stricture, hiatal hernia, less likely eosinophilic esophagitis. Recommend endoscopic evaluation for diagnostic and therapeutic purposes.  Patient also with mild chronic constipation.  Plan: EGD with esophageal dilation in the future with Dr. Gala Romney.  If there are scheduling issues, patient is willing to complete upper endoscopy with Dr. Abbey Chatters.  Plan for propofol given polypharmacy. ASA II.  I have discussed the risks, alternatives, benefits with regards to but not limited to the risk of reaction to medication, bleeding, infection, perforation and the patient is agreeable to proceed. Written consent to be obtained. Continue esomeprazole 40 mg, take 30 minutes before last meal of the day. Continue over-the-counter antacids as needed.  Take per package labeling. Reinforced antireflux measures.  Handouts provided. Trial of MiraLAX 1 capful daily as needed.

## 2021-05-01 NOTE — H&P (View-Only) (Signed)
Primary Care Physician:  Noreene Larsson, NP  Primary Gastroenterologist:  Garfield Cornea, MD   Chief Complaint  Patient presents with   Gastroesophageal Reflux    C/o vomiting acid up, food particles also   Dysphagia     HPI:  Jacqueline [name] is a 60 y.o. female here at the request of Dr. Moshe Cipro for further evaluation of GERD, dysphagia.   Patient last seen in our practice in 2016, seen for refractory GERD, h/o LPR. EGD at that time showed mild erosive reflux esophagitis. Colonoscopy was normal. Next colonoscopy in 2026.   Today: Over several months feels like food is sticking in the throat/chest.  First couple time she noticed it when she was eating out at a restaurant, eating whitefish.  Has since been occurring at other times on a regular basis.  Denies problems swallowing liquids or pills.  She does have some burning in her throat which she contributes to heartburn.  Most of her reflux symptoms seem to be at nighttime.  Complains of frequent nocturnal regurgitation. Sometimes does extra over the counter TUMs with esomeprazole. Has been on PPI for about a year this time. More recently taking PPI at night.  Tries to wait 2 to 3 hours before laying down after a meal but sometimes she does not do this.  Denies abdominal pain. BM every 2-3 days. Sometimes little hard. No melena, brbpr.  Bone hoarseness. H/o growth on vocal cord, around age 82.    Current Outpatient Medications  Medication Sig Dispense Refill   amLODipine (NORVASC) 5 MG tablet Take 1 tablet by mouth once daily 90 tablet 1   atorvastatin (LIPITOR) 20 MG tablet Take 1 tablet (20 mg total) by mouth daily. 90 tablet 1   esomeprazole (NEXIUM) 40 MG capsule Take 1 capsule by mouth once daily 30 capsule 0   estradiol (ESTRACE) 0.1 MG/GM vaginal cream INSERT 2 GRAMS VAGINALLY AS DIRECTED EVERY OTHER NIGHT 43 g 11   fluticasone (FLONASE) 50 MCG/ACT nasal spray Use 2 sprays in each nostril BID for a week. After 1 week, decrease to 1  spray in each nostril BID as needed for congestion/allergies. 16 g 6   gabapentin (NEURONTIN) 300 MG capsule Take 1 capsule by mouth at bedtime 90 capsule 0   meloxicam (MOBIC) 7.5 MG tablet Take 1 tablet by mouth once daily 90 tablet 0   methocarbamol (ROBAXIN) 500 MG tablet Take 1 tablet (500 mg total) by mouth every 6 (six) hours as needed for muscle spasms. 90 tablet 2   Multiple Vitamin (MULTIVITAMIN WITH MINERALS) TABS tablet Take 1 tablet by mouth daily.     traZODone (DESYREL) 50 MG tablet Take 0.5-1 tablets (25-50 mg total) by mouth at bedtime as needed for sleep. 30 tablet 3   triamcinolone cream (KENALOG) 0.1 % Apply 1 application topically 2 (two) times daily. 30 g 0   No current facility-administered medications for this visit.    Allergies as of 05/01/2021   (No Known Allergies)    Past Medical History:  Diagnosis Date   Anemia    Arthritis    per patient, back   Complication of anesthesia 2018   per patient HR dropped during partial hysterectomy surgery   Fibroid uterus    GERD (gastroesophageal reflux disease)    Hyperlipidemia    Borderline   Hypertension    Numbness and tingling in right hand    Pre-diabetes    borderline    Past Surgical History:  Procedure Laterality Date  BACK SURGERY     CHOLECYSTECTOMY     COLONOSCOPY  2003   Dr. Gala Romney: Anal canal hemorrhoids   COLONOSCOPY N/A 08/25/2014   Procedure: COLONOSCOPY;  Surgeon: Daneil Dolin, MD;  [facility]: [facility];  Service: Endoscopy;  Laterality: N/A;  1200   ESOPHAGOGASTRODUODENOSCOPY N/A 08/25/2014   Procedure: ESOPHAGOGASTRODUODENOSCOPY (EGD);  Surgeon: Daneil Dolin, MD;  [facility]: [facility];  Service: Endoscopy;  Laterality: N/A;   SALPINGOOPHORECTOMY Bilateral 11/06/2016   Procedure: BILATERAL SALPINGO OOPHORECTOMY;  Surgeon: Florian Buff, MD;  Location: [facility];  Service: Gynecology;  Laterality: Bilateral;   SUPRACERVICAL ABDOMINAL HYSTERECTOMY N/A 11/06/2016   Procedure:  HYSTERECTOMY SUPRACERVICAL ABDOMINAL;  Surgeon: Florian Buff, MD;  Location: [facility];  Service: Gynecology;  Laterality: N/A;   TRANSFORAMINAL LUMBAR INTERBODY FUSION (TLIF) WITH PEDICLE SCREW FIXATION 2 LEVEL Left 07/28/2019   Procedure: LEFT-SIDED LUMBAR 4- 5, LUMBAR 5 -SACRUM1 TRANSFORAMINAL LUMBAR INTERBODY FUSION WITH INSTRUMENTATION AND ALLOGRAFT;  Surgeon: Phylliss Bob, MD;  Location: [place] [facility];  Service: Orthopedics;  Laterality: Left;   TUBAL LIGATION      Family History  Problem Relation Age of Onset   Cancer Maternal Grandmother         lymphnodes   Hypertension Maternal Aunt    Cancer Maternal Aunt        Breast   Colon cancer Neg Hx     Social History   Socioeconomic History   Marital status: Single    Spouse name: Not on file   Number of children: 3   Years of education: Not on file   Highest education level: Not on file  Occupational History   Occupation: PT at [employer]  Tobacco Use   Smoking status: Never   Smokeless tobacco: Never   Tobacco comments:    Never smoked  Vaping Use   Vaping Use: Never used  Substance and Sexual Activity   Alcohol use: No    Alcohol/week: 0.0 standard drinks   Drug use: No   Sexual activity: Yes    Birth control/protection: Post-menopausal, Surgical  Other Topics Concern   Not on file  Social History Narrative   Not on file   Social Determinants of Health   Financial Resource Strain: Not on file  Food Insecurity: Not on file  Transportation Needs: Not on file  Physical Activity: Not on file  Stress: Not on file  Social Connections: Not on file  Intimate Partner Violence: Not on file      ROS:  General: Negative for anorexia, weight loss, fever, chills, fatigue, weakness. Eyes: Negative for vision changes.  ENT: Negative for hoarseness, nasal congestion.  See HPI CV: Negative for chest pain, angina, palpitations, dyspnea on exertion, peripheral edema.  Respiratory: Negative for dyspnea at rest, dyspnea on  exertion, cough, sputum, wheezing.  GI: See history of present illness. GU:  Negative for dysuria, hematuria, urinary incontinence, urinary frequency, nocturnal urination.  MS: Negative for joint pain.  Some intermittent back pain.  Derm: Negative for rash or itching.  Neuro: Negative for weakness, abnormal sensation, seizure, frequent headaches, memory loss, confusion.  Psych: Negative for anxiety, depression, suicidal ideation, hallucinations.  Endo: Negative for unusual weight change.  Heme: Negative for bruising or bleeding. Allergy: Negative for rash or hives.    Physical Examination:  BP 122/79   Pulse (!) 103   Temp (!) 97 F (36.1 C)   Ht 5\' 3"  (1.6 m)   Wt 179 lb (81.2 kg)   LMP  10/26/2016 (Exact Date)   BMI 31.71 kg/m    General: Well-nourished, well-developed in no acute distress.  Head: Normocephalic, atraumatic.   Eyes: Conjunctiva pink, no icterus. Mouth: masked Neck: Supple without thyromegaly, masses, or lymphadenopathy.  Lungs: Clear to auscultation bilaterally.  Heart: Regular rate and rhythm, no murmurs rubs or gallops.  Abdomen: Bowel sounds are normal, nontender, nondistended, no hepatosplenomegaly or masses, no abdominal bruits or    hernia , no rebound or guarding.   Rectal: not performed Extremities: No lower extremity edema. No clubbing or deformities.  Neuro: Alert and oriented x 4 , grossly normal neurologically.  Skin: Warm and dry, no rash or jaundice.   Psych: Alert and cooperative, normal mood and affect.  Labs: Lab Results  Component Value Date   TSH 0.805 12/29/2020   Lab Results  Component Value Date   WBC 13.3 (H) 12/29/2020   HGB 12.3 12/29/2020   HCT 35.9 12/29/2020   MCV 90 12/29/2020   PLT 206 12/29/2020   Lab Results  Component Value Date   HGBA1C 6.3 (H) 12/29/2020   Lab Results  Component Value Date   CREATININE 1.02 (H) 12/29/2020   BUN 20 12/29/2020   NA 139 12/29/2020   K 3.8 12/29/2020   CL 102 12/29/2020    CO2 23 12/29/2020   Lab Results  Component Value Date   ALT 17 12/29/2020   AST 24 12/29/2020   ALKPHOS 105 12/29/2020   BILITOT 0.5 12/29/2020     Imaging Studies: No results found.   Assessment:  Pleasant 60 year old female with prior history of LPR, vocal cord growth removed around age 77 presenting with somewhat refractory reflux symptoms, nocturnal regurgitation and solid food dysphagia. Symptoms inadequately controlled on esomeprazole 40 mg daily.  Differential diagnosis includes reflux esophagitis, esophageal stricture, hiatal hernia, less likely eosinophilic esophagitis. Recommend endoscopic evaluation for diagnostic and therapeutic purposes.  Patient also with mild chronic constipation.  Plan: EGD with esophageal dilation in the future with Dr. Gala Romney.  If there are scheduling issues, patient is willing to complete upper endoscopy with Dr. Abbey Chatters.  Plan for propofol given polypharmacy. ASA II.  I have discussed the risks, alternatives, benefits with regards to but not limited to the risk of reaction to medication, bleeding, infection, perforation and the patient is agreeable to proceed. Written consent to be obtained. Continue esomeprazole 40 mg, take 30 minutes before last meal of the day. Continue over-the-counter antacids as needed.  Take per package labeling. Reinforced antireflux measures.  Handouts provided. Trial of MiraLAX 1 capful daily as needed.

## 2021-05-10 ENCOUNTER — Other Ambulatory Visit: Payer: Self-pay | Admitting: Internal Medicine

## 2021-05-10 DIAGNOSIS — K219 Gastro-esophageal reflux disease without esophagitis: Secondary | ICD-10-CM

## 2021-05-16 ENCOUNTER — Encounter: Payer: Self-pay | Admitting: Nurse Practitioner

## 2021-05-16 ENCOUNTER — Other Ambulatory Visit: Payer: Self-pay

## 2021-05-16 ENCOUNTER — Ambulatory Visit (INDEPENDENT_AMBULATORY_CARE_PROVIDER_SITE_OTHER): Payer: 59 | Admitting: Nurse Practitioner

## 2021-05-16 VITALS — BP 114/77 | HR 82 | Temp 98.5°F | Resp 16 | Ht 63.0 in | Wt 180.0 lb

## 2021-05-16 DIAGNOSIS — M79671 Pain in right foot: Secondary | ICD-10-CM | POA: Diagnosis not present

## 2021-05-16 NOTE — Patient Instructions (Signed)
You previous liver enzymes looked good, so go with tylenol for pain, especially since you are seeing GI for reflux we want to avoid NSAIDs like ibuprofen or aleve.  I sent in the referral to podiatry.

## 2021-05-16 NOTE — Assessment & Plan Note (Signed)
-  has some swelling to right foot; ? Ganglion cyst -referral to podiatry for eval and treatment

## 2021-05-16 NOTE — Progress Notes (Signed)
Acute Office Visit  Subjective:    Patient ID: Jacqueline [name], female    DOB: Nov 07, 1960, 60 y.o.   MRN: [id number]  Chief Complaint  Patient presents with   Toe Pain    Right top of foot pain x 1 month and it shoots down into her big toe, hurts to walk on it sometimes.     Toe Pain   Patient is in today for right foot pain x 1 month. Pain originates at the top of her right foot and radiates into her great toe. She states she sometimes has pain with ambulation.  Past Medical History:  Diagnosis Date   Anemia    Arthritis    per patient, back   Complication of anesthesia 2018   per patient HR dropped during partial hysterectomy surgery   Fibroid uterus    GERD (gastroesophageal reflux disease)    Hyperlipidemia    Borderline   Hypertension    Numbness and tingling in right hand    Pre-diabetes    borderline    Past Surgical History:  Procedure Laterality Date   BACK SURGERY     CHOLECYSTECTOMY     COLONOSCOPY  2003   Dr. Gala Romney: Anal canal hemorrhoids   COLONOSCOPY N/A 08/25/2014   Procedure: COLONOSCOPY;  Surgeon: Daneil Dolin, MD;  Location: [facility];  Service: Endoscopy;  Laterality: N/A;  1200   ESOPHAGOGASTRODUODENOSCOPY N/A 08/25/2014   Procedure: ESOPHAGOGASTRODUODENOSCOPY (EGD);  Surgeon: Daneil Dolin, MD;  Location: [facility];  Service: Endoscopy;  Laterality: N/A;   SALPINGOOPHORECTOMY Bilateral 11/06/2016   Procedure: BILATERAL SALPINGO OOPHORECTOMY;  Surgeon: Florian Buff, MD;  Location: [facility];  Service: Gynecology;  Laterality: Bilateral;   SUPRACERVICAL ABDOMINAL HYSTERECTOMY N/A 11/06/2016   Procedure: HYSTERECTOMY SUPRACERVICAL ABDOMINAL;  Surgeon: Florian Buff, MD;  Location: [facility];  Service: Gynecology;  Laterality: N/A;   TRANSFORAMINAL LUMBAR INTERBODY FUSION (TLIF) WITH PEDICLE SCREW FIXATION 2 LEVEL Left 07/28/2019   Procedure: LEFT-SIDED LUMBAR 4- 5, LUMBAR 5 -SACRUM1 TRANSFORAMINAL LUMBAR INTERBODY FUSION WITH INSTRUMENTATION AND  ALLOGRAFT;  Surgeon: Phylliss Bob, MD;  Location: [place];  Service: Orthopedics;  Laterality: Left;   TUBAL LIGATION      Family History  Problem Relation Age of Onset   Cancer Maternal Grandmother         lymphnodes   Hypertension Maternal Aunt    Cancer Maternal Aunt        Breast   Colon cancer Neg Hx     Social History   Socioeconomic History   Marital status: Single    Spouse name: Not on file   Number of children: 3   Years of education: Not on file   Highest education level: Not on file  Occupational History   Occupation: PT at [employer]  Tobacco Use   Smoking status: Never   Smokeless tobacco: Never   Tobacco comments:    Never smoked  Vaping Use   Vaping Use: Never used  Substance and Sexual Activity   Alcohol use: No    Alcohol/week: 0.0 standard drinks   Drug use: No   Sexual activity: Yes    Birth control/protection: Post-menopausal, Surgical  Other Topics Concern   Not on file  Social History Narrative   Not on file   Social Determinants of Health   Financial Resource Strain: Not on file  Food Insecurity: Not on file  Transportation Needs: Not on file  Physical Activity: Not on file  Stress:  Not on file  Social Connections: Not on file  Intimate Partner Violence: Not on file    Outpatient Medications Prior to Visit  Medication Sig Dispense Refill   amLODipine (NORVASC) 5 MG tablet Take 1 tablet by mouth once daily 90 tablet 1   atorvastatin (LIPITOR) 20 MG tablet Take 1 tablet (20 mg total) by mouth daily. 90 tablet 1   esomeprazole (NEXIUM) 40 MG capsule Take 1 capsule by mouth once daily 30 capsule 0   estradiol (ESTRACE) 0.1 MG/GM vaginal cream INSERT 2 GRAMS VAGINALLY AS DIRECTED EVERY OTHER NIGHT 43 g 11   fluticasone (FLONASE) 50 MCG/ACT nasal spray Use 2 sprays in each nostril BID for a week. After 1 week, decrease to 1 spray in each nostril BID as needed for congestion/allergies. 16 g 6   gabapentin (NEURONTIN) 300 MG capsule Take 1  capsule by mouth at bedtime 90 capsule 0   meloxicam (MOBIC) 7.5 MG tablet Take 1 tablet by mouth once daily 90 tablet 0   methocarbamol (ROBAXIN) 500 MG tablet Take 1 tablet (500 mg total) by mouth every 6 (six) hours as needed for muscle spasms. 90 tablet 2   Multiple Vitamin (MULTIVITAMIN WITH MINERALS) TABS tablet Take 1 tablet by mouth daily.     traZODone (DESYREL) 50 MG tablet Take 0.5-1 tablets (25-50 mg total) by mouth at bedtime as needed for sleep. 30 tablet 3   triamcinolone cream (KENALOG) 0.1 % Apply 1 application topically 2 (two) times daily. 30 g 0   No facility-administered medications prior to visit.    No Known Allergies  Review of Systems  Constitutional: Negative.   Respiratory: Negative.    Cardiovascular: Negative.   Musculoskeletal:        Mild/mod right foot pain per HPI.      Objective:    Physical Exam Constitutional:      Appearance: Normal appearance.  Musculoskeletal:     Comments: Right foot swelling to superior/lateral aspect  Neurological:     Mental Status: She is alert.    BP 114/77   Pulse 82   Temp 98.5 F (36.9 C) (Oral)   Resp 16   Ht _0  (1.6 m)   Wt 180 lb (81.6 kg)   LMP 10/26/2016 (Exact Date)   SpO2 96%   BMI 31.89 kg/m  Wt Readings from Last 3 Encounters:  05/16/21 180 lb (81.6 kg)  05/01/21 179 lb (81.2 kg)  01/04/21 184 lb (83.5 kg)    Health Maintenance Due  Topic Date Due   Zoster Vaccines- Shingrix (1 of 2) Never done   COVID-19 Vaccine (3 - Booster for Moderna series) 02/24/2020   INFLUENZA VACCINE  01/08/2021    There are no preventive care reminders to display for this patient.   Lab Results  Component Value Date   TSH 0.805 12/29/2020   Lab Results  Component Value Date   WBC 13.3 (H) 12/29/2020   HGB 12.3 12/29/2020   HCT 35.9 12/29/2020   MCV 90 12/29/2020   PLT 206 12/29/2020   Lab Results  Component Value Date   NA 139 12/29/2020   K 3.8 12/29/2020   CO2 23 12/29/2020   GLUCOSE 85  12/29/2020   BUN 20 12/29/2020   CREATININE 1.02 (H) 12/29/2020   BILITOT 0.5 12/29/2020   ALKPHOS 105 12/29/2020   AST 24 12/29/2020   ALT 17 12/29/2020   PROT 7.4 12/29/2020   ALBUMIN 4.3 12/29/2020   CALCIUM 9.4 12/29/2020   ANIONGAP  11 01/20/2020   EGFR 64 12/29/2020   Lab Results  Component Value Date   CHOL 168 12/29/2020   Lab Results  Component Value Date   HDL 57 12/29/2020   Lab Results  Component Value Date   LDLCALC 100 (H) 12/29/2020   Lab Results  Component Value Date   TRIG 56 12/29/2020   Lab Results  Component Value Date   CHOLHDL 2.9 12/29/2020   Lab Results  Component Value Date   HGBA1C 6.3 (H) 12/29/2020       Assessment & Plan:   Problem List Items Addressed This Visit       Other   Right foot pain - Primary    -has some swelling to right foot; ? Ganglion cyst -referral to podiatry for eval and treatment      Relevant Orders   Ambulatory referral to Podiatry     No orders of the defined types were placed in this encounter.    Noreene Larsson, NP

## 2021-05-22 ENCOUNTER — Ambulatory Visit (HOSPITAL_COMMUNITY)
Admission: RE | Admit: 2021-05-22 | Discharge: 2021-05-22 | Disposition: A | Discharge status: Home / Self Care | Payer: 59 | Attending: Internal Medicine | Admitting: Internal Medicine

## 2021-05-22 ENCOUNTER — Encounter (HOSPITAL_COMMUNITY)
Admission: RE | Disposition: A | Discharge status: Home / Self Care | Payer: Self-pay | Source: Home / Self Care | Attending: Internal Medicine

## 2021-05-22 ENCOUNTER — Ambulatory Visit (HOSPITAL_COMMUNITY): Payer: 59 | Admitting: Anesthesiology

## 2021-05-22 ENCOUNTER — Encounter (HOSPITAL_COMMUNITY): Payer: Self-pay

## 2021-05-22 ENCOUNTER — Other Ambulatory Visit: Payer: Self-pay

## 2021-05-22 DIAGNOSIS — B9681 Helicobacter pylori [H. pylori] as the cause of diseases classified elsewhere: Secondary | ICD-10-CM | POA: Insufficient documentation

## 2021-05-22 DIAGNOSIS — R1310 Dysphagia, unspecified: Secondary | ICD-10-CM | POA: Insufficient documentation

## 2021-05-22 DIAGNOSIS — Z79899 Other long term (current) drug therapy: Secondary | ICD-10-CM | POA: Diagnosis not present

## 2021-05-22 DIAGNOSIS — I10 Essential (primary) hypertension: Secondary | ICD-10-CM | POA: Insufficient documentation

## 2021-05-22 DIAGNOSIS — K219 Gastro-esophageal reflux disease without esophagitis: Secondary | ICD-10-CM | POA: Insufficient documentation

## 2021-05-22 DIAGNOSIS — Q394 Esophageal web: Secondary | ICD-10-CM | POA: Insufficient documentation

## 2021-05-22 DIAGNOSIS — K2970 Gastritis, unspecified, without bleeding: Secondary | ICD-10-CM

## 2021-05-22 HISTORY — PX: ESOPHAGOGASTRODUODENOSCOPY (EGD) WITH PROPOFOL: SHX5813

## 2021-05-22 HISTORY — PX: BALLOON DILATION: SHX5330

## 2021-05-22 SURGERY — ESOPHAGOGASTRODUODENOSCOPY (EGD) WITH PROPOFOL
Anesthesia: General

## 2021-05-22 MED ORDER — LACTATED RINGERS IV SOLN: INTRAVENOUS | Status: DC

## 2021-05-22 MED ORDER — PROPOFOL 10 MG/ML IV BOLUS
INTRAVENOUS | Status: DC | PRN
  Administered 2021-05-22: 120 mg via INTRAVENOUS
  Administered 2021-05-22: 60 mg via INTRAVENOUS

## 2021-05-22 MED ORDER — LIDOCAINE HCL (CARDIAC) PF 100 MG/5ML IV SOSY
PREFILLED_SYRINGE | INTRAVENOUS | Status: DC | PRN
  Administered 2021-05-22: 50 mg via INTRAVENOUS

## 2021-05-22 NOTE — Anesthesia Preprocedure Evaluation (Addendum)
Anesthesia Evaluation  Patient identified by MRN, date of birth, ID band Patient awake    Reviewed: Allergy & Precautions, H&P , NPO status , Patient's Chart, lab work & pertinent test results  History of Anesthesia Complications (+) history of anesthetic complications (tachycardia )  Airway Mallampati: II  TM Distance: >3 FB Neck ROM: Full    Dental  (+) Dental Advisory Given, Missing   Pulmonary neg pulmonary ROS,    Pulmonary exam normal breath sounds clear to auscultation       Cardiovascular hypertension, Pt. on medications Normal cardiovascular exam Rhythm:Regular Rate:Normal     Neuro/Psych  Neuromuscular disease negative psych ROS   GI/Hepatic Neg liver ROS, GERD  Medicated,  Endo/Other  negative endocrine ROS  Renal/GU negative Renal ROS  negative genitourinary   Musculoskeletal  (+) Arthritis , Osteoarthritis,    Abdominal   Peds negative pediatric ROS (+)  Hematology negative hematology ROS (+) anemia ,   Anesthesia Other Findings Back pain Sinusitis   Reproductive/Obstetrics negative OB ROS                           Anesthesia Physical Anesthesia Plan  ASA: 2  Anesthesia Plan: General   Post-op Pain Management: Minimal or no pain anticipated   Induction: Intravenous  PONV Risk Score and Plan:   Airway Management Planned: Nasal Cannula and Natural Airway  Additional Equipment:   Intra-op Plan:   Post-operative Plan:   Informed Consent: I have reviewed the patients History and Physical, chart, labs and discussed the procedure including the risks, benefits and alternatives for the proposed anesthesia with the patient or authorized representative who has indicated his/her understanding and acceptance.     Dental advisory given  Plan Discussed with: CRNA and Surgeon  Anesthesia Plan Comments:         Anesthesia Quick Evaluation

## 2021-05-22 NOTE — Anesthesia Procedure Notes (Signed)
Date/Time: 05/22/2021 9:28 AM Performed by: Orlie Dakin, CRNA Pre-anesthesia Checklist: Patient identified, Emergency Drugs available, Suction available and Patient being monitored Patient Re-evaluated:Patient Re-evaluated prior to induction Oxygen Delivery Method: Nasal cannula Induction Type: IV induction Placement Confirmation: positive ETCO2

## 2021-05-22 NOTE — Interval H&P Note (Signed)
History and Physical Interval Note:  05/22/2021 9:15 AM  Jacqueline [name]  has presented today for surgery, with the diagnosis of GERD, dysphagia.  The various methods of treatment have been discussed with the patient and family. After consideration of risks, benefits and other options for treatment, the patient has consented to  Procedure(s) with comments: ESOPHAGOGASTRODUODENOSCOPY (EGD) WITH PROPOFOL (N/A) - 9:15am BALLOON DILATION (N/A) as a surgical intervention.  The patient's history has been reviewed, patient examined, no change in status, stable for surgery.  I have reviewed the patient's chart and labs.  Questions were answered to the patient's satisfaction.     Eloise Harman

## 2021-05-22 NOTE — Op Note (Signed)
[facility] Patient Name: Jacqueline [name] Procedure Date: 05/22/2021 9:14 AM MRN: [id number] Date of Birth: 10/24/1960 Attending MD: Elon Alas. Edgar Frisk CSN: [id number] Age: 60 Admit Type: Outpatient Procedure:                Upper GI endoscopy Indications:              Dysphagia, Heartburn Providers:                Elon Alas. Abbey Chatters, DO, Charlsie Quest. Insurance claims handler, Therapist, sports,                            Suzan Garibaldi. Risa Grill, Technician Referring MD:              Medicines:                See the Anesthesia note for documentation of the                            administered medications Complications:            No immediate complications. Estimated Blood Loss:     Estimated blood loss was minimal. Procedure:                Pre-Anesthesia Assessment:                           - The anesthesia plan was to use monitored                            anesthesia care (MAC).                           After obtaining informed consent, the endoscope was                            passed under direct vision. Throughout the                            procedure, the patient's blood pressure, pulse, and                            oxygen saturations were monitored continuously. The                            GIF-H190 ([id number]) scope was introduced through the                            mouth, and advanced to the second part of duodenum.                            The upper GI endoscopy was accomplished without                            difficulty. The patient tolerated the procedure                            well. 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 A TTS dilator was passed       through the scope. Dilation with an 18-19-20 mm balloon dilator was       performed to 19 mm. The dilation site was examined and showed mild       mucosal disruption and moderate improvement in luminal  narrowing.      Localized mild inflammation characterized by erythema was found in the       gastric body. Biopsies were taken with a cold forceps for Helicobacter       pylori testing.      The duodenal bulb, first portion of the duodenum and second portion of       the duodenum were normal. 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                           - Gastritis. Biopsied.                           - Normal duodenal bulb, first portion of the                            duodenum and second portion of the duodenum. Moderate Sedation:      Per Anesthesia Care Recommendation:           - Patient has a contact number available for                            emergencies. The signs and symptoms of potential                            delayed complications were discussed with the                            patient. Return to normal activities tomorrow.                            Written discharge instructions were provided to the                            patient.                           - Resume previous diet.                           - Continue present medications.                           - Await pathology results.                           - Repeat upper endoscopy PRN for retreatment.                           -  Return to GI clinic in 4 months.                           - Use Nexium (esomeprazole) 40 mg PO daily. Procedure Code(s):        --- Professional ---                           [phone], Esophagogastroduodenoscopy, flexible,                            transoral; with transendoscopic balloon dilation of                            esophagus (less than 30 mm diameter)                           43239, 59, Esophagogastroduodenoscopy, flexible,                            transoral; with biopsy, single or multiple Diagnosis Code(s):        --- Professional ---                           Q39.4, Esophageal web                           K29.70, Gastritis,  unspecified, without bleeding                           R13.10, Dysphagia, unspecified                           R12, Heartburn CPT copyright 2019 American Medical Association. All rights reserved. The codes documented in this report are preliminary and upon coder review may  be revised to meet current compliance requirements. Elon Alas. Abbey Chatters, DO Lamar Abbey Chatters, DO 05/22/2021 9:34:37 AM This report has been signed electronically. Number of Addenda: 0

## 2021-05-22 NOTE — Discharge Instructions (Addendum)
EGD Discharge instructions Please read the instructions outlined below and refer to this sheet in the next few weeks. These discharge instructions provide you with general information on caring for yourself after you leave the hospital. Your doctor may also give you specific instructions. While your treatment has been planned according to the most current medical practices available, unavoidable complications occasionally occur. If you have any problems or questions after discharge, please call your doctor. ACTIVITY You may resume your regular activity but move at a slower pace for the next 24 hours.  Take frequent rest periods for the next 24 hours.  Walking will help expel (get rid of) the air and reduce the bloated feeling in your abdomen.  No driving for 24 hours (because of the anesthesia (medicine) used during the test).  You may shower.  Do not sign any important legal documents or operate any machinery for 24 hours (because of the anesthesia used during the test).  NUTRITION Drink plenty of fluids.  You may resume your normal diet.  Begin with a light meal and progress to your normal diet.  Avoid alcoholic beverages for 24 hours or as instructed by your caregiver.  MEDICATIONS You may resume your normal medications unless your caregiver tells you otherwise.  WHAT YOU CAN EXPECT TODAY You may experience abdominal discomfort such as a feeling of fullness or gas pains.  FOLLOW-UP Your doctor will discuss the results of your test with you.  SEEK IMMEDIATE MEDICAL ATTENTION IF ANY OF THE FOLLOWING OCCUR: Excessive nausea (feeling sick to your stomach) and/or vomiting.  Severe abdominal pain and distention (swelling).  Trouble swallowing.  Temperature over 101 F (37.8 C).  Rectal bleeding or vomiting of blood.   Your EGD revealed mild amount inflammation in your stomach.  I took biopsies of this to rule out infection with a bacteria called H. pylori.  Await pathology results, my  office will contact you.  You also had a narrowing of your esophagus which I stretched with a balloon today.  Hopefully this helps with your swallowing.  Continue on Nexium daily.  Follow-up with GI in 4 months.    I hope you have a great rest of your week!  Elon Alas. Abbey Chatters, D.O. Gastroenterology and Hepatology [facility]

## 2021-05-22 NOTE — Transfer of Care (Signed)
Immediate Anesthesia Transfer of Care Note  Patient: Jacqueline [name]  Procedure(s) Performed: ESOPHAGOGASTRODUODENOSCOPY (EGD) WITH PROPOFOL BALLOON DILATION  Patient Location: Endoscopy Unit  Anesthesia Type:General  Level of Consciousness: drowsy  Airway & Oxygen Therapy: Patient Spontanous Breathing  Post-op Assessment: Report given to RN and Post -op Vital signs reviewed and stable  Post vital signs: Reviewed and stable  Last Vitals:  Vitals Value Taken Time  BP    Temp    Pulse    Resp    SpO2      Last Pain:  Vitals:   05/22/21 0935  TempSrc: Oral  PainSc:       Patients Stated Pain Goal: 7 (60/63/01 6010)  Complications: No notable events documented.

## 2021-05-22 NOTE — Anesthesia Postprocedure Evaluation (Signed)
Anesthesia Post Note  Patient: CAROLYNNE SCHUCHARD  Procedure(s) Performed: ESOPHAGOGASTRODUODENOSCOPY (EGD) WITH PROPOFOL BALLOON DILATION  Patient location during evaluation: Endoscopy Anesthesia Type: General Level of consciousness: awake and alert and oriented Pain management: pain level controlled Vital Signs Assessment: post-procedure vital signs reviewed and stable Respiratory status: spontaneous breathing, nonlabored ventilation and respiratory function stable Cardiovascular status: stable and blood pressure returned to baseline Postop Assessment: no apparent nausea or vomiting Anesthetic complications: no   No notable events documented.   Last Vitals:  Vitals:   05/22/21 0756 05/22/21 0935  BP: 137/77 (!) 97/59  Pulse: 95   Temp: 36.9 C 36.6 C  SpO2: 98% 98%    Last Pain:  Vitals:   05/22/21 0935  TempSrc: Oral  PainSc: 0-No pain                 Rashiya Lofland C Ande Therrell

## 2021-05-23 ENCOUNTER — Other Ambulatory Visit: Payer: Self-pay

## 2021-05-23 DIAGNOSIS — K2970 Gastritis, unspecified, without bleeding: Secondary | ICD-10-CM

## 2021-05-23 DIAGNOSIS — R1319 Other dysphagia: Secondary | ICD-10-CM

## 2021-05-23 DIAGNOSIS — K219 Gastro-esophageal reflux disease without esophagitis: Secondary | ICD-10-CM

## 2021-05-23 DIAGNOSIS — B9681 Helicobacter pylori [H. pylori] as the cause of diseases classified elsewhere: Secondary | ICD-10-CM

## 2021-05-23 LAB — SURGICAL PATHOLOGY

## 2021-05-23 MED ORDER — AMOXICILL-CLARITHRO-LANSOPRAZ PO MISC: Freq: Two times a day (BID) | ORAL | 0 refills | Status: DC

## 2021-05-24 ENCOUNTER — Other Ambulatory Visit: Payer: Self-pay

## 2021-05-24 DIAGNOSIS — K219 Gastro-esophageal reflux disease without esophagitis: Secondary | ICD-10-CM

## 2021-05-24 DIAGNOSIS — B9681 Helicobacter pylori [H. pylori] as the cause of diseases classified elsewhere: Secondary | ICD-10-CM

## 2021-05-24 DIAGNOSIS — R1319 Other dysphagia: Secondary | ICD-10-CM

## 2021-05-24 MED ORDER — CLARITHROMYCIN 500 MG PO TABS: 500.0000 mg | ORAL_TABLET | Freq: Two times a day (BID) | ORAL | 0 refills | Status: AC

## 2021-05-24 MED ORDER — AMOXICILLIN 500 MG PO TABS: 1000.0000 mg | ORAL_TABLET | Freq: Two times a day (BID) | ORAL | 0 refills | Status: AC

## 2021-05-24 MED ORDER — LANSOPRAZOLE 30 MG PO CPDR
30.0000 mg | DELAYED_RELEASE_CAPSULE | Freq: Every day | ORAL | 0 refills | Status: DC
Start: 2021-05-24 — End: 2021-07-17

## 2021-05-24 MED ORDER — PYLERA 140-125-125 MG PO CAPS
3.0000 | ORAL_CAPSULE | Freq: Three times a day (TID) | ORAL | 0 refills | Status: DC
Start: 2021-05-24 — End: 2021-05-24

## 2021-05-25 ENCOUNTER — Encounter (HOSPITAL_COMMUNITY): Payer: Self-pay | Admitting: Internal Medicine

## 2021-05-28 ENCOUNTER — Ambulatory Visit (INDEPENDENT_AMBULATORY_CARE_PROVIDER_SITE_OTHER): Payer: 59 | Admitting: Podiatry

## 2021-05-28 ENCOUNTER — Ambulatory Visit (INDEPENDENT_AMBULATORY_CARE_PROVIDER_SITE_OTHER): Payer: 59

## 2021-05-28 ENCOUNTER — Encounter: Payer: Self-pay | Admitting: Podiatry

## 2021-05-28 ENCOUNTER — Other Ambulatory Visit: Payer: Self-pay

## 2021-05-28 ENCOUNTER — Telehealth: Payer: Self-pay | Admitting: *Deleted

## 2021-05-28 DIAGNOSIS — M778 Other enthesopathies, not elsewhere classified: Secondary | ICD-10-CM

## 2021-05-28 DIAGNOSIS — G5791 Unspecified mononeuropathy of right lower limb: Secondary | ICD-10-CM

## 2021-05-28 DIAGNOSIS — M84374A Stress fracture, right foot, initial encounter for fracture: Secondary | ICD-10-CM | POA: Diagnosis not present

## 2021-05-28 DIAGNOSIS — B353 Tinea pedis: Secondary | ICD-10-CM | POA: Diagnosis not present

## 2021-05-28 MED ORDER — KETOCONAZOLE 2 % EX CREA: 1.0000 "application " | TOPICAL_CREAM | Freq: Every day | CUTANEOUS | 2 refills | Status: DC

## 2021-05-28 NOTE — Addendum Note (Signed)
Addended by: Lorenda Peck R on: 05/28/2021 03:53 PM   Modules accepted: Orders, Level of Service

## 2021-05-28 NOTE — Addendum Note (Signed)
Addended by: Rip Harbour on: 05/28/2021 03:39 PM   Modules accepted: Orders

## 2021-05-28 NOTE — Telephone Encounter (Signed)
Patient called to see if she can still come in today, missed this mornings appointment. Please reschedule.

## 2021-05-28 NOTE — Progress Notes (Addendum)
°  Subjective:  Patient ID: Jacqueline [name], female    DOB: [birth year],   MRN: [id number]  Chief Complaint  Patient presents with   Foot Pain    Dorsal midfoot/hallux right - aching x few months, radiating sharpness through to the big toe, PCP eval-thought cyst lateral midfoot was to cause, no treatment   New Patient (Initial Visit)    60 y.o. female presents for dorsal midfoot and right hallux pain. Relates she has had aching and radiating sharp pain through the big toe. PCP referred for concern of a cyst. Denies any treatments. Also relates dry itchy skin on the bottom of her feet. No treatments.  . Denies any other pedal complaints. Denies n/v/f/c.   Past Medical History:  Diagnosis Date   Anemia    Arthritis    per patient, back   Complication of anesthesia 2018   per patient HR dropped during partial hysterectomy surgery   Fibroid uterus    GERD (gastroesophageal reflux disease)    Hyperlipidemia    Borderline   Hypertension    Numbness and tingling in right hand    Pre-diabetes    borderline    Objective:  Physical Exam: Vascular: DP/PT pulses 2/4 bilateral. CFT <3 seconds. Normal hair growth on digits. No edema.  Skin. No lacerations or abrasions bilateral feet. Scaling noted to plantar foot bilateral.  Musculoskeletal: MMT 5/5 bilateral lower extremities in DF, PF, Inversion and Eversion. Deceased ROM in DF of ankle joint. Tender over dorsal second and third metatarsals. No pain with ROM. Mild edema noted over dorsal foot. No pain to plantar foot.  Neurological: Sensation intact to light touch.   Assessment:   1. Stress reaction of right foot, initial encounter   2. Neuritis of right foot   3. Tinea pedis of both feet      Plan:  Patient was evaluated and treated and all questions answered. X-rays reviewed and discussed with patient. No acute fractures or dislocations noted.  -Discussed treatement options for stress reaction vs neuritis; risks, alternatives, and  benefits explained.  -Dispensed surgical shoe. Patient to wear at all times and instructed on use -Recommend protection, rest, ice, elevation daily until symptoms improve -Rx pain med/antinflammatories as needed -Prescription for ketoconazole provided.  -Patient to return to office in 4 weeks for serial x-rays to assess healing  or sooner if condition worsens.    Lorenda Peck, DPM

## 2021-05-31 ENCOUNTER — Telehealth: Payer: Self-pay | Admitting: *Deleted

## 2021-05-31 NOTE — Telephone Encounter (Signed)
Patient is calling get a guard for her boot,can not have open toe shoes at work. Please advise.

## 2021-06-25 ENCOUNTER — Other Ambulatory Visit: Payer: Self-pay

## 2021-06-25 ENCOUNTER — Encounter: Payer: Self-pay | Admitting: Podiatry

## 2021-06-25 ENCOUNTER — Other Ambulatory Visit: Payer: Self-pay | Admitting: Podiatry

## 2021-06-25 ENCOUNTER — Ambulatory Visit (INDEPENDENT_AMBULATORY_CARE_PROVIDER_SITE_OTHER): Payer: 59

## 2021-06-25 ENCOUNTER — Ambulatory Visit: Payer: 59 | Admitting: Podiatry

## 2021-06-25 DIAGNOSIS — M84374D Stress fracture, right foot, subsequent encounter for fracture with routine healing: Secondary | ICD-10-CM

## 2021-06-25 DIAGNOSIS — M84374A Stress fracture, right foot, initial encounter for fracture: Secondary | ICD-10-CM

## 2021-06-25 DIAGNOSIS — G5791 Unspecified mononeuropathy of right lower limb: Secondary | ICD-10-CM | POA: Diagnosis not present

## 2021-06-25 NOTE — Progress Notes (Signed)
°  Subjective:  Patient ID: Jacqueline [name], female    DOB: Nov 04, 1960,   MRN: [id number]  Chief Complaint  Patient presents with   Follow-up    Pt is her to f/u stress reaction Right foot. Taking meloxicam wearing surgical shoe and applying ketoconazole     61 y.o. female presents for follow-up of dorsal foot pain. Relates it is doing better and not having as much pain. Has been taking the meloxicam as needed and wearing the surgical shoe.  No treatments.  . Denies any other pedal complaints. Denies n/v/f/c.   Past Medical History:  Diagnosis Date   Anemia    Arthritis    per patient, back   Complication of anesthesia 2018   per patient HR dropped during partial hysterectomy surgery   Fibroid uterus    GERD (gastroesophageal reflux disease)    Hyperlipidemia    Borderline   Hypertension    Numbness and tingling in right hand    Pre-diabetes    borderline    Objective:  Physical Exam: Vascular: DP/PT pulses 2/4 bilateral. CFT <3 seconds. Normal hair growth on digits. No edema.  Skin. No lacerations or abrasions bilateral feet. Scaling noted to plantar foot bilateral.  Musculoskeletal: MMT 5/5 bilateral lower extremities in DF, PF, Inversion and Eversion. Deceased ROM in DF of ankle joint. Tender over dorsal second and third metatarsals improved.  No pain with ROM. Mild edema noted over dorsal foot. No pain to plantar foot.  Neurological: Sensation intact to light touch.   Assessment:   1. Stress reaction of right foot, initial encounter   2. Neuritis of right foot      Plan:  Patient was evaluated and treated and all questions answered. X-rays reviewed and discussed with patient. No acute fractures or dislocations noted.  -Discussed treatement options for stress reaction vs neuritis; risks, alternatives, and benefits explained.  -May transition out of shoe into regular shoes and return to work in the next 3-4 days.  -Recommend protection, rest, ice, elevation daily until  symptoms improve -Rx pain med/antinflammatories as needed -Prescription for ketoconazole provided.  -Patient to return to office as needed    Lorenda Peck, DPM

## 2021-07-05 ENCOUNTER — Encounter: Payer: Self-pay | Admitting: Nurse Practitioner

## 2021-07-05 ENCOUNTER — Other Ambulatory Visit: Payer: Self-pay

## 2021-07-05 ENCOUNTER — Ambulatory Visit (INDEPENDENT_AMBULATORY_CARE_PROVIDER_SITE_OTHER): Payer: 59 | Admitting: Nurse Practitioner

## 2021-07-05 VITALS — BP 112/74 | HR 93 | Resp 17 | Ht 62.5 in | Wt 183.0 lb

## 2021-07-05 DIAGNOSIS — Z23 Encounter for immunization: Secondary | ICD-10-CM

## 2021-07-05 DIAGNOSIS — I10 Essential (primary) hypertension: Secondary | ICD-10-CM

## 2021-07-05 DIAGNOSIS — R112 Nausea with vomiting, unspecified: Secondary | ICD-10-CM | POA: Diagnosis not present

## 2021-07-05 DIAGNOSIS — R109 Unspecified abdominal pain: Secondary | ICD-10-CM

## 2021-07-05 DIAGNOSIS — R197 Diarrhea, unspecified: Secondary | ICD-10-CM | POA: Diagnosis not present

## 2021-07-05 DIAGNOSIS — K219 Gastro-esophageal reflux disease without esophagitis: Secondary | ICD-10-CM

## 2021-07-05 MED ORDER — LOPERAMIDE HCL 2 MG PO TABS: 2.0000 mg | ORAL_TABLET | Freq: Four times a day (QID) | ORAL | 0 refills | Status: DC | PRN

## 2021-07-05 MED ORDER — DICYCLOMINE HCL 10 MG PO CAPS: 10.0000 mg | ORAL_CAPSULE | Freq: Three times a day (TID) | ORAL | 0 refills | Status: DC

## 2021-07-05 NOTE — Assessment & Plan Note (Signed)
Followed by GI. Continue lansoprazole 30 mg daily

## 2021-07-05 NOTE — Assessment & Plan Note (Signed)
DASH diet and commitment to daily physical activity for a minimum of 30 minutes discussed and encouraged, as a part of hypertension management. The importance of attaining a healthy weight is also discussed.  BP/Weight 07/05/2021 05/22/2021 05/16/2021 05/01/2021 01/04/2021 7/94/4461 9/0/1222  Systolic BP 411 97 464 314 276 701 100  Diastolic BP 74 59 77 79 82 80 74  Wt. (Lbs) 183 180 180 179 184 183 184  BMI 32.94 31.89 31.89 31.71 33.12 32.94 33.12   Continue current medication, egfr today

## 2021-07-05 NOTE — Assessment & Plan Note (Signed)
Take Imodium as needed for diarrhea. Patient to treat drink plenty of water to stay hydrated. Check CMP today.

## 2021-07-05 NOTE — Assessment & Plan Note (Signed)
Patient states taking ginger ale as helped her nausea. Patient told to drink plenty of water to stay hydrated. She was able to eat breakfast,She has not had any nausea or vomiting today. Check CMP

## 2021-07-05 NOTE — Assessment & Plan Note (Signed)
Take Bentyl 10 mg 4 times daily with meals for 4 days.

## 2021-07-05 NOTE — Assessment & Plan Note (Signed)
Patient educated on CDC recommendation for the vaccine. Verbal consent was obtained from the patient, vaccine administered by nurse, no sign of adverse reactions noted at this time. Patient education on arm soreness and use of tylenol or ibuprofen (if safe) for this patient  was discussed. Patient educated on the signs and symptoms of adverse effect and advise to contact the office if they occur.t.

## 2021-07-05 NOTE — Patient Instructions (Addendum)
Take imodium as needed for your diarrhea, drink plenty of water to stay hydrated.  Take bentyl four times daily with meal for your stomach cramps.   It is important that you exercise regularly at least 30 minutes 5 times a week.  Think about what you will eat, plan ahead. Choose " clean, green, fresh or frozen" over canned, processed or packaged foods which are more sugary, salty and fatty. 70 to 75% of food eaten should be vegetables and fruit. Three meals at set times with snacks allowed between meals, but they must be fruit or vegetables. Aim to eat over a 12 hour period , example 7 am to 7 pm, and STOP after  your last meal of the day. Drink water,generally about 64 ounces per day, no other drink is as healthy. Fruit juice is best enjoyed in a healthy way, by EATING the fruit.  Thanks for choosing [facility], we consider it a privelige to serve you.

## 2021-07-05 NOTE — Progress Notes (Signed)
° °  MIOSHA BEHE     MRN: [id number]      DOB: May 10, 1961   HPI Jacqueline [name] is here for complaints of abdominal cramps  Pt stated that she went out 6 days ago ate Hot dog at the store, she started having having diahrea, dizziness, stomach pain when she got home.  She  had 2 ''soft''diarhea yesterday, none today. She has eaten today no more dizziness, still having left sided abdominal pain 8/10. Stoll color is greenish , brownish, denies blood in the stool. Vomited twice last night. She takes lansoprazole for GERD. She has not used any medication for her diarrhea, or nausea she currentlly denies dizziness. .    ROS Denies recent fever or chills. Denies sinus pressure, nasal congestion, ear pain or sore throat. Denies chest congestion, productive cough or wheezing. Denies chest pains, palpitations and leg swelling has abdominal pain, nausea, vomiting,diarrhea, acid felux  PE  BP 112/74    Pulse 93    Resp 17    Ht 5' 2.5" (1.588 m)    Wt 183 lb (83 kg)    LMP 10/26/2016 (Exact Date)    SpO2 95%    BMI 32.94 kg/m   Patient alert and oriented and in no cardiopulmonary distress.  Chest: Clear to auscultation bilaterally.  CVS: S1, S2 no murmurs, no S3.Regular rate.  ABD: Soft, tenderness on palpation of left upper abd quadrant, no masses palpated    Skin: Intact, no ulcerations or rash noted.  Psych: Good eye contact, normal affect. Memory intact not anxious or depressed appearing.    Assessment & Plan

## 2021-07-06 ENCOUNTER — Telehealth: Payer: Self-pay | Admitting: Nurse Practitioner

## 2021-07-06 ENCOUNTER — Other Ambulatory Visit: Payer: Self-pay | Admitting: Nurse Practitioner

## 2021-07-06 DIAGNOSIS — E870 Hyperosmolality and hypernatremia: Secondary | ICD-10-CM

## 2021-07-06 LAB — CMP14+EGFR
ALT: 18 IU/L (ref 0–32)
AST: 22 IU/L (ref 0–40)
Albumin/Globulin Ratio: 1.6 (ref 1.2–2.2)
Albumin: 4.6 g/dL (ref 3.8–4.9)
Alkaline Phosphatase: 123 IU/L — ABNORMAL HIGH (ref 44–121)
BUN/Creatinine Ratio: 15 (ref 9–23)
BUN: 15 mg/dL (ref 6–24)
Bilirubin Total: 0.5 mg/dL (ref 0.0–1.2)
CO2: 25 mmol/L (ref 20–29)
Calcium: 9.8 mg/dL (ref 8.7–10.2)
Chloride: 104 mmol/L (ref 96–106)
Creatinine, Ser: 1.01 mg/dL — ABNORMAL HIGH (ref 0.57–1.00)
Globulin, Total: 2.9 g/dL (ref 1.5–4.5)
Glucose: 118 mg/dL — ABNORMAL HIGH (ref 70–99)
Potassium: 4.5 mmol/L (ref 3.5–5.2)
Sodium: 152 mmol/L — ABNORMAL HIGH (ref 134–144)
Total Protein: 7.5 g/dL (ref 6.0–8.5)
eGFR: 64 mL/min/{1.73_m2} (ref >59)

## 2021-07-06 LAB — CBC WITH DIFFERENTIAL/PLATELET
Basophils Absolute: 0.1 10*3/uL (ref 0.0–0.2)
Basos: 1 %
EOS (ABSOLUTE): 0.8 10*3/uL — ABNORMAL HIGH (ref 0.0–0.4)
Eos: 10 %
Hematocrit: 39.5 % (ref 34.0–46.6)
Hemoglobin: 13.2 g/dL (ref 11.1–15.9)
Immature Grans (Abs): 0 10*3/uL (ref 0.0–0.1)
Immature Granulocytes: 0 %
Lymphocytes Absolute: 2.2 10*3/uL (ref 0.7–3.1)
Lymphs: 30 %
MCH: 30.3 pg (ref 26.6–33.0)
MCHC: 33.4 g/dL (ref 31.5–35.7)
MCV: 91 fL (ref 79–97)
Monocytes Absolute: 0.6 10*3/uL (ref 0.1–0.9)
Monocytes: 8 %
Neutrophils Absolute: 3.8 10*3/uL (ref 1.4–7.0)
Neutrophils: 51 %
Platelets: 243 10*3/uL (ref 150–450)
RBC: 4.35 x10E6/uL (ref 3.77–5.28)
RDW: 13.1 % (ref 11.7–15.4)
WBC: 7.4 10*3/uL (ref 3.4–10.8)

## 2021-07-06 NOTE — Telephone Encounter (Signed)
Already spoken to pt

## 2021-07-06 NOTE — Progress Notes (Signed)
Please review labs with patient. Blood work shows that patient is dehydrated, pt should drink plenty of fluids to stay hydrated.  Her liver enzyme is a little bit elevated as well she should avoid Tylenol.  We will repeat labs in 1 week. If pt has seizure like activity she should go to the ER.  If pt . I Have ordered labs to be repeated in one week. Thanks

## 2021-07-06 NOTE — Telephone Encounter (Signed)
Pt LVM in regard to visit 1/26  Pt states she is still having stomach pains

## 2021-07-11 ENCOUNTER — Telehealth: Payer: Self-pay | Admitting: Podiatry

## 2021-07-11 NOTE — Telephone Encounter (Signed)
Jacqueline [name] was released to return to work on 06/29/2021, but she didn't return due to continued foot pain. 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 So she remained out of work, now I have paperwork to complete on her. She was out of work 05/28/2021- 07/15/2021, please advise?

## 2021-07-17 ENCOUNTER — Telehealth: Payer: Self-pay | Admitting: Internal Medicine

## 2021-07-17 DIAGNOSIS — E870 Hyperosmolality and hypernatremia: Secondary | ICD-10-CM | POA: Diagnosis not present

## 2021-07-17 MED ORDER — PANTOPRAZOLE SODIUM 40 MG PO TBEC: 40.0000 mg | DELAYED_RELEASE_TABLET | Freq: Two times a day (BID) | ORAL | 3 refills | Status: DC

## 2021-07-17 NOTE — Telephone Encounter (Signed)
Spoke with pt and pt states that she has been taking the lansoprazole, because 90 tablets were called in at the time her H. Pylori treatment started. Pt states that she was instructed to take all of them. Pt would like to be switched to pantoprazole as recommended. Please send to  [facility].

## 2021-07-17 NOTE — Telephone Encounter (Signed)
According to our records she was on esomeprazole at last visit not lansoprazole. Lansoprazole was 14 day course given with H.pylori treatment.   We can increase her esomeprazole to 40mg  twice daily OR I can switch her to pantoprazole 40mg  twice a day.  Let me know what she prefers.

## 2021-07-17 NOTE — Telephone Encounter (Signed)
PATIENT RETURNED CALL  

## 2021-07-17 NOTE — Addendum Note (Signed)
Addended by: Mahala Menghini on: 07/17/2021 01:34 PM   Modules accepted: Orders

## 2021-07-17 NOTE — Telephone Encounter (Signed)
[phone]  PATIENT CALLED AND SAID SHE IS STILL GETTING REFLUX EVEN WITH HER MEDICATION, WAS TOLD ABOUT OMEPRAZOLE AND WANTS TO TRY THAT

## 2021-07-17 NOTE — Telephone Encounter (Signed)
Pt states that her acid reflux medication (lansoprazole) is no longer working. Pt's stated a friend is on omeprazole and has been doing well on it but was unaware that she had been on it in the past. Pt is wanting to try something else that may be stronger or better to help with her acid reflux.

## 2021-07-17 NOTE — Telephone Encounter (Signed)
Rx sent for pantoprazole. Stop lansoprazole.

## 2021-07-17 NOTE — Telephone Encounter (Signed)
See other note

## 2021-07-18 LAB — CMP14+EGFR
ALT: 24 IU/L (ref 0–32)
AST: 24 IU/L (ref 0–40)
Albumin/Globulin Ratio: 1.6 (ref 1.2–2.2)
Albumin: 4.4 g/dL (ref 3.8–4.9)
Alkaline Phosphatase: 116 IU/L (ref 44–121)
BUN/Creatinine Ratio: 14 (ref 9–23)
BUN: 14 mg/dL (ref 6–24)
Bilirubin Total: 0.5 mg/dL (ref 0.0–1.2)
CO2: 21 mmol/L (ref 20–29)
Calcium: 9.6 mg/dL (ref 8.7–10.2)
Chloride: 104 mmol/L (ref 96–106)
Creatinine, Ser: 0.99 mg/dL (ref 0.57–1.00)
Globulin, Total: 2.7 g/dL (ref 1.5–4.5)
Glucose: 89 mg/dL (ref 70–99)
Potassium: 4.2 mmol/L (ref 3.5–5.2)
Sodium: 138 mmol/L (ref 134–144)
Total Protein: 7.1 g/dL (ref 6.0–8.5)
eGFR: 66 mL/min/{1.73_m2} (ref >59)

## 2021-07-18 NOTE — Progress Notes (Signed)
Please review results with patient, her sodium level is back to normal. Thank you.

## 2021-07-18 NOTE — Progress Notes (Signed)
Spoke with pt advised of results pt verbalized understanding

## 2021-07-31 ENCOUNTER — Other Ambulatory Visit: Payer: Self-pay

## 2021-07-31 DIAGNOSIS — I10 Essential (primary) hypertension: Secondary | ICD-10-CM

## 2021-07-31 MED ORDER — AMLODIPINE BESYLATE 5 MG PO TABS: 5.0000 mg | ORAL_TABLET | Freq: Every day | ORAL | 1 refills | Status: DC

## 2021-08-27 DIAGNOSIS — M533 Sacrococcygeal disorders, not elsewhere classified: Secondary | ICD-10-CM | POA: Diagnosis not present

## 2021-08-28 ENCOUNTER — Other Ambulatory Visit: Payer: Self-pay | Admitting: Orthopedic Surgery

## 2021-08-28 DIAGNOSIS — M545 Low back pain, unspecified: Secondary | ICD-10-CM

## 2021-09-25 ENCOUNTER — Ambulatory Visit: Payer: 59 | Admitting: Gastroenterology

## 2021-10-03 ENCOUNTER — Encounter: Payer: 59 | Admitting: Nurse Practitioner

## 2021-10-16 ENCOUNTER — Encounter: Payer: Self-pay | Admitting: Nurse Practitioner

## 2021-10-16 ENCOUNTER — Ambulatory Visit (INDEPENDENT_AMBULATORY_CARE_PROVIDER_SITE_OTHER): Payer: 59 | Admitting: Nurse Practitioner

## 2021-10-16 VITALS — BP 120/82 | HR 90 | Ht 62.0 in | Wt 183.0 lb

## 2021-10-16 DIAGNOSIS — M545 Low back pain, unspecified: Secondary | ICD-10-CM | POA: Diagnosis not present

## 2021-10-16 DIAGNOSIS — I10 Essential (primary) hypertension: Secondary | ICD-10-CM | POA: Diagnosis not present

## 2021-10-16 DIAGNOSIS — E559 Vitamin D deficiency, unspecified: Secondary | ICD-10-CM

## 2021-10-16 DIAGNOSIS — Z0000 Encounter for general adult medical examination without abnormal findings: Secondary | ICD-10-CM | POA: Diagnosis not present

## 2021-10-16 DIAGNOSIS — E782 Mixed hyperlipidemia: Secondary | ICD-10-CM | POA: Diagnosis not present

## 2021-10-16 DIAGNOSIS — G8929 Other chronic pain: Secondary | ICD-10-CM | POA: Diagnosis not present

## 2021-10-16 DIAGNOSIS — E66811 Obesity, class 1: Secondary | ICD-10-CM

## 2021-10-16 DIAGNOSIS — Z23 Encounter for immunization: Secondary | ICD-10-CM | POA: Insufficient documentation

## 2021-10-16 DIAGNOSIS — Z1231 Encounter for screening mammogram for malignant neoplasm of breast: Secondary | ICD-10-CM

## 2021-10-16 DIAGNOSIS — R7303 Prediabetes: Secondary | ICD-10-CM

## 2021-10-16 DIAGNOSIS — E669 Obesity, unspecified: Secondary | ICD-10-CM | POA: Diagnosis not present

## 2021-10-16 DIAGNOSIS — L309 Dermatitis, unspecified: Secondary | ICD-10-CM

## 2021-10-16 MED ORDER — TRIAMCINOLONE ACETONIDE 0.1 % EX CREA: 1.0000 "application " | TOPICAL_CREAM | Freq: Two times a day (BID) | CUTANEOUS | 0 refills | Status: DC

## 2021-10-16 MED ORDER — MELOXICAM 7.5 MG PO TABS: 7.5000 mg | ORAL_TABLET | Freq: Every day | ORAL | 0 refills | Status: DC

## 2021-10-16 NOTE — Assessment & Plan Note (Signed)
Thanks Kenalog 0.1% cream apply twice daily ?Will refer to dermatology if rashes does not resolve ?

## 2021-10-16 NOTE — Progress Notes (Signed)
? ?Complete physical exam ? ?Patient: Jacqueline [name]   DOB: [birth year]   61 y.o. Female  MRN: [id number] ? ?Subjective:  ?  ?Chief Complaint  ?Patient presents with  ? Annual Exam  ?  cpe  ? ? ?Jacqueline [name] is a 61 y.o. female with past medical history of hypertension, GERD, obesity, hyperlipidemia, history of back surgery who presents today for a complete physical exam. She reports consuming a low fat and low sodium diet. The patient does not participate in regular exercise at present. Has dogs around her neighborhood that makes her not to do her walking exercises She generally feels fairly well. Has chronic back pain ,She reports sleeping fairly well. She does have additional problems to discuss today.  ? ? ?Last eye exam was early this year, wears prescription glasses, does not have dental insurance and so does not see a dentist at the moment.  ? ?Patient complains of chronic itchy rashes, on her left leg, stated that she has had the rashes since last year, she has tried OTC hydrocortizone cream but it did not help.  Denies fever, chills, malaise ? ?Up-to-date with colonoscopy and Pap, due for mammogram and shingles vaccine.  Not due for Pap until July 2023. ? ?Most recent fall risk assessment: ? ?  10/16/2021  ?  4:20 PM  ?Fall Risk   ?Falls in the past year? 0  ?Number falls in past yr: 0  ?Injury with Fall? 0  ?Risk for fall due to : No Fall Risks  ?Follow up Falls evaluation completed  ? ?  ?Most recent depression screenings: ? ?  10/16/2021  ?  4:20 PM 07/05/2021  ? 10:57 AM  ?PHQ 2/9 Scores  ?PHQ - 2 Score 0 0  ? ? ? ? ? ? ?Patient Care Team: ?Renee Rival, FNP as PCP - General (Nurse Practitioner) ?Rourk, Cristopher Estimable, MD as Consulting Physician (Gastroenterology)  ? ?Outpatient Medications Prior to Visit  ?Medication Sig  ? acyclovir (ZOVIRAX) 400 MG tablet Take 400 mg by mouth daily as needed (outbreaks).  ? amLODipine (NORVASC) 5 MG tablet Take 1 tablet (5 mg total) by mouth daily.  ? atorvastatin  (LIPITOR) 20 MG tablet Take 20 mg by mouth daily.  ? estradiol (ESTRACE) 0.1 MG/GM vaginal cream INSERT 2 GRAMS VAGINALLY AS DIRECTED EVERY OTHER NIGHT  ? fluticasone (FLONASE) 50 MCG/ACT nasal spray Use 2 sprays in each nostril BID for a week. After 1 week, decrease to 1 spray in each nostril BID as needed for congestion/allergies. (Patient taking differently: 1 spray 2 (two) times daily as needed (sinus drainage/congestion.).)  ? ketoconazole (NIZORAL) 2 % cream Apply 1 application topically daily.  ? methocarbamol (ROBAXIN) 500 MG tablet Take 1 tablet (500 mg total) by mouth every 6 (six) hours as needed for muscle spasms.  ? Multiple Vitamin (MULTIVITAMIN WITH MINERALS) TABS tablet Take 1 tablet by mouth daily with supper. One-A-Day Multivitamin  ? pantoprazole (PROTONIX) 40 MG tablet Take 1 tablet (40 mg total) by mouth 2 (two) times daily before a meal.  ? [DISCONTINUED] meloxicam (MOBIC) 7.5 MG tablet Take 1 tablet by mouth once daily (Patient taking differently: Take 7.5 mg by mouth daily with lunch.)  ? acetaminophen (TYLENOL) 500 MG tablet Take 500-1,000 mg by mouth every 6 (six) hours as needed (for pain.).  ? cyclobenzaprine (FLEXERIL) 5 MG tablet Take 5-10 mg by mouth at bedtime as needed for muscle spasms. (Patient not taking: Reported on 10/16/2021)  ? dicyclomine (BENTYL)  10 MG capsule Take 1 capsule (10 mg total) by mouth 4 (four) times daily -  before meals and at bedtime. (Patient not taking: Reported on 10/16/2021)  ? loperamide (IMODIUM A-D) 2 MG tablet Take 1 tablet (2 mg total) by mouth 4 (four) times daily as needed for diarrhea or loose stools. (Patient not taking: Reported on 10/16/2021)  ? ?No facility-administered medications prior to visit.  ? ? ?Review of Systems  ?Constitutional: Negative.   ?HENT: Negative.    ?Eyes: Negative.   ?Respiratory: Negative.    ?Cardiovascular: Negative.   ?Gastrointestinal: Negative.   ?Genitourinary: Negative.   ?Musculoskeletal:  Positive for back pain and  joint pain. Negative for falls, myalgias and neck pain.  ?Skin:  Positive for itching and rash.  ?Neurological: Negative.   ?Endo/Heme/Allergies: Negative.   ?Psychiatric/Behavioral: Negative.    ? ? ? ? ?   ?Objective:  ? ?  ?BP 120/82 (BP Location: Right Arm, Patient Position: Sitting, Cuff Size: Large)   Pulse 90   Ht '5\' 2"'$  (1.575 m)   Wt 183 lb (83 kg)   LMP 10/26/2016 (Exact Date)   SpO2 98%   BMI 33.47 kg/m?  ? ?Jolisa MA present as chaperone ?Physical Exam ?Constitutional:   ?   General: She is not in acute distress. ?   Appearance: Normal appearance. She is obese. She is not ill-appearing, toxic-appearing or diaphoretic.  ?HENT:  ?   Head: Normocephalic and atraumatic.  ?   Right Ear: Tympanic membrane, ear canal and external ear normal. There is no impacted cerumen.  ?   Left Ear: Tympanic membrane, ear canal and external ear normal. There is no impacted cerumen.  ?   Nose: Nose normal. No congestion or rhinorrhea.  ?   Mouth/Throat:  ?   Mouth: Mucous membranes are moist.  ?   Pharynx: Oropharynx is clear. No oropharyngeal exudate or posterior oropharyngeal erythema.  ?Eyes:  ?   General: No scleral icterus.    ?   Right eye: No discharge.     ?   Left eye: No discharge.  ?   Extraocular Movements: Extraocular movements intact.  ?   Conjunctiva/sclera: Conjunctivae normal.  ?   Pupils: Pupils are equal, round, and reactive to light.  ?Neck:  ?   Vascular: No carotid bruit.  ?Cardiovascular:  ?   Rate and Rhythm: Normal rate and regular rhythm.  ?   Pulses: Normal pulses.  ?   Heart sounds: Normal heart sounds. No murmur heard. ?  No friction rub. No gallop.  ?Pulmonary:  ?   Effort: No respiratory distress.  ?   Breath sounds: No stridor. No wheezing, rhonchi or rales.  ?Chest:  ?   Chest wall: No mass, lacerations, deformity, swelling, tenderness, crepitus or edema.  ?Breasts: ?   Tanner Score is 5.  ?   Right: Normal. No swelling, bleeding, inverted nipple, mass, nipple discharge, skin change or  tenderness.  ?   Left: Normal. No swelling, bleeding, inverted nipple, mass, nipple discharge, skin change or tenderness.  ?Abdominal:  ?   General: There is no distension.  ?   Palpations: Abdomen is soft. There is no mass.  ?   Tenderness: There is no abdominal tenderness. There is no right CVA tenderness, left CVA tenderness, guarding or rebound.  ?   Hernia: No hernia is present.  ?Musculoskeletal:     ?   General: Tenderness present. No swelling, deformity or signs of injury.  ?  Cervical back: Normal range of motion and neck supple. No rigidity or tenderness.  ?   Right lower leg: No edema.  ?   Left lower leg: No edema.  ?   Comments: Tenderness on palpation of mid low back, needed ROM of spine  ?Lymphadenopathy:  ?   Cervical: No cervical adenopathy.  ?   Upper Body:  ?   Right upper body: No supraclavicular, axillary or pectoral adenopathy.  ?   Left upper body: No supraclavicular or axillary adenopathy.  ?Skin: ?   General: Skin is warm and dry.  ?   Capillary Refill: Capillary refill takes less than 2 seconds.  ?   Coloration: Skin is not jaundiced or pale.  ?   Findings: No bruising, erythema, lesion or rash.  ?Neurological:  ?   Mental Status: She is alert and oriented to person, place, and time.  ?   Cranial Nerves: No cranial nerve deficit.  ?   Sensory: No sensory deficit.  ?   Motor: No weakness.  ?   Coordination: Coordination normal.  ?   Gait: Gait normal.  ?   Deep Tendon Reflexes: Reflexes normal.  ?Psychiatric:     ?   Mood and Affect: Mood normal.     ?   Behavior: Behavior normal.     ?   Thought Content: Thought content normal.     ?   Judgment: Judgment normal.  ?  ? ?No results found for any visits on 10/16/21. ? ?   ?Assessment & Plan:  ?  ?Routine Health Maintenance and Physical Exam ? ?Immunization History  ?Administered Date(s) Administered  ? Hep A / Hep B 11/17/2006, 01/21/2007, 07/01/2007  ? Influenza Inj Mdck Quad With Preservative 04/04/2020  ? Influenza Whole 06/05/2005  ?  Influenza,inj,Quad PF,6+ Mos 07/05/2021  ? Influenza-Unspecified 03/18/2014, 03/02/2016, 03/29/2019  ? Moderna Sars-Covid-2 Vaccination 11/30/2019, 12/30/2019  ? Td 03/26/1989, 05/21/2005  ? Tdap 03/29/2016  ? Zoster Reco

## 2021-10-16 NOTE — Patient Instructions (Addendum)
Please apply kenalog cream to your rashes.  Apply 1 application. topically 2 (two) times daily ? ? ?It is important that you exercise regularly at least 30 minutes 5 times a week.  ?Think about what you will eat, plan ahead. ?Choose " clean, green, fresh or frozen" over canned, processed or packaged foods which are more sugary, salty and fatty. ?70 to 75% of food eaten should be vegetables and fruit. ?Three meals at set times with snacks allowed between meals, but they must be fruit or vegetables. ?Aim to eat over a 12 hour period , example 7 am to 7 pm, and STOP after  your last meal of the day. ?Drink water,generally about 64 ounces per day, no other drink is as healthy. Fruit juice is best enjoyed in a healthy way, by EATING the fruit. ? ?Thanks for choosing [facility], we consider it a privelige to serve you.  ?

## 2021-10-16 NOTE — Assessment & Plan Note (Signed)
BP Readings from Last 3 Encounters:  ?10/16/21 120/82  ?07/05/21 112/74  ?05/22/21 (!) 97/59  ?Chronic condition well-controlled on amlodipine 5 mg daily continue current medication, DASH diet advised, engage in regular exercises at least 150 minutes weekly ?CMP ordered ?

## 2021-10-16 NOTE — Assessment & Plan Note (Signed)
Lab Results  ?Component Value Date  ? HGBA1C 6.3 (H) 12/29/2020  ? ?A1c ordered ?Avoid sugar sweets soda engage in daily exercises at least 150 minutes ?

## 2021-10-16 NOTE — Assessment & Plan Note (Signed)
Lab Results  ?Component Value Date  ? CHOL 168 12/29/2020  ? HDL 57 12/29/2020  ? LDLCALC 100 (H) 12/29/2020  ? TRIG 56 12/29/2020  ? CHOLHDL 2.9 12/29/2020  ?On atorvastatin 20 mg daily ?Lipid panel ordered avoid fried fatty foods ?

## 2021-10-16 NOTE — Assessment & Plan Note (Signed)
Annual exam as documented.  ?Counseling done include healthy lifestyle involving committing to 150 minutes of exercise per week, heart healthy diet, and attaining healthy weight. The importance of adequate sleep also discussed.  ?Regular use of seat belt and home safety were also discussed . ?Changes in health habits are decided on by patient with goals and time frames set for achieving them. ?Immunization and cancer screening  needs are specifically addressed at this visit.   ?

## 2021-10-16 NOTE — Assessment & Plan Note (Signed)
Wt Readings from Last 3 Encounters:  ?10/16/21 183 lb (83 kg)  ?07/05/21 183 lb (83 kg)  ?05/22/21 180 lb (81.6 kg)  ? she has not exercising ?Need to increase intake of whole food consisting mainly vegetables and protein less carbohydrate drinking at least 64 ounces of water daily, engaging in regular exercises at least 150 minutes weekly, importance of portion control also discussed with patient she verbalized understanding. ?

## 2021-10-16 NOTE — Assessment & Plan Note (Signed)
Chronic condition on methocarbamol 500 mg every 6 hours as needed, meloxicam 7.5 mg daily ?Meloxicam refilled today ?States that she has upcoming appointment with orthopedics for steroid injection ?

## 2021-10-16 NOTE — Assessment & Plan Note (Signed)
Patient educated on CDC recommendation for the vaccine. Verbal consent was obtained from the patient, vaccine administered by nurse, no sign of adverse reactions noted at this time. Patient education on arm soreness and use of tylenol for this patient  was discussed. Patient educated on the signs and symptoms of adverse effect and advise to contact the office if they occur.t ?

## 2021-10-17 DIAGNOSIS — R7303 Prediabetes: Secondary | ICD-10-CM | POA: Diagnosis not present

## 2021-10-17 DIAGNOSIS — E559 Vitamin D deficiency, unspecified: Secondary | ICD-10-CM | POA: Diagnosis not present

## 2021-10-17 DIAGNOSIS — I10 Essential (primary) hypertension: Secondary | ICD-10-CM | POA: Diagnosis not present

## 2021-10-17 DIAGNOSIS — Z0000 Encounter for general adult medical examination without abnormal findings: Secondary | ICD-10-CM | POA: Diagnosis not present

## 2021-10-18 LAB — LIPID PANEL
Chol/HDL Ratio: 2.7 ratio (ref 0.0–4.4)
Cholesterol, Total: 150 mg/dL (ref 100–199)
HDL: 56 mg/dL (ref >39)
LDL Chol Calc (NIH): 79 mg/dL (ref 0–99)
Triglycerides: 81 mg/dL (ref 0–149)
VLDL Cholesterol Cal: 15 mg/dL (ref 5–40)

## 2021-10-18 LAB — CMP14+EGFR
ALT: 15 IU/L (ref 0–32)
AST: 23 IU/L (ref 0–40)
Albumin/Globulin Ratio: 1.4 (ref 1.2–2.2)
Albumin: 4.2 g/dL (ref 3.8–4.9)
Alkaline Phosphatase: 96 IU/L (ref 44–121)
BUN/Creatinine Ratio: 10 (ref 9–23)
BUN: 10 mg/dL (ref 6–24)
Bilirubin Total: 0.8 mg/dL (ref 0.0–1.2)
CO2: 25 mmol/L (ref 20–29)
Calcium: 9.3 mg/dL (ref 8.7–10.2)
Chloride: 103 mmol/L (ref 96–106)
Creatinine, Ser: 1 mg/dL (ref 0.57–1.00)
Globulin, Total: 3 g/dL (ref 1.5–4.5)
Glucose: 86 mg/dL (ref 70–99)
Potassium: 3.9 mmol/L (ref 3.5–5.2)
Sodium: 140 mmol/L (ref 134–144)
Total Protein: 7.2 g/dL (ref 6.0–8.5)
eGFR: 65 mL/min/{1.73_m2} (ref >59)

## 2021-10-18 LAB — HEMOGLOBIN A1C
Est. average glucose Bld gHb Est-mCnc: 123 mg/dL
Hgb A1c MFr Bld: 5.9 % — ABNORMAL HIGH (ref 4.8–5.6)

## 2021-10-18 LAB — VITAMIN D 25 HYDROXY (VIT D DEFICIENCY, FRACTURES): Vit D, 25-Hydroxy: 43.6 ng/mL (ref 30.0–100.0)

## 2021-10-18 LAB — TSH: TSH: 0.686 u[IU]/mL (ref 0.450–4.500)

## 2021-10-18 NOTE — Progress Notes (Signed)
Prediabetes, avoid sugar sweetened soda ?Other labs are normal continue current medications

## 2021-10-26 ENCOUNTER — Other Ambulatory Visit: Payer: Self-pay | Admitting: Nurse Practitioner

## 2021-10-26 ENCOUNTER — Telehealth: Payer: Self-pay | Admitting: Nurse Practitioner

## 2021-10-26 MED ORDER — ATORVASTATIN CALCIUM 20 MG PO TABS: 20.0000 mg | ORAL_TABLET | Freq: Every day | ORAL | 0 refills | Status: DC

## 2021-10-26 MED ORDER — METHOCARBAMOL 500 MG PO TABS
500.0000 mg | ORAL_TABLET | Freq: Four times a day (QID) | ORAL | 0 refills | Status: DC | PRN
Start: 2021-10-26 — End: 2022-03-15

## 2021-10-26 NOTE — Telephone Encounter (Signed)
Please advise 

## 2021-10-26 NOTE — Telephone Encounter (Signed)
Pt states she has been trying with [facility] to get a refill on her cholesterol med and [facility] states they have not received a response. Can you please refill? States she has missed a couple days due to being out of the medication.  Also needs flexeril refilled as well.    [facility]

## 2021-10-29 ENCOUNTER — Telehealth: Payer: Self-pay | Admitting: Nurse Practitioner

## 2021-10-29 NOTE — Telephone Encounter (Signed)
Patient needs refill on   dicyclomine (BENTYL) 10 MG capsule

## 2021-10-29 NOTE — Telephone Encounter (Signed)
Please advise 

## 2021-10-30 ENCOUNTER — Ambulatory Visit
Admission: RE | Admit: 2021-10-30 | Discharge: 2021-10-30 | Disposition: A | Discharge status: Home / Self Care | Payer: 59 | Source: Ambulatory Visit | Attending: Orthopedic Surgery | Admitting: Orthopedic Surgery

## 2021-10-30 DIAGNOSIS — G8929 Other chronic pain: Secondary | ICD-10-CM | POA: Diagnosis not present

## 2021-10-30 DIAGNOSIS — M545 Low back pain, unspecified: Secondary | ICD-10-CM

## 2021-10-30 DIAGNOSIS — M4326 Fusion of spine, lumbar region: Secondary | ICD-10-CM | POA: Diagnosis not present

## 2021-10-30 MED ORDER — METHYLPREDNISOLONE ACETATE 80 MG/ML IJ SUSP: 80.0000 mg | Freq: Once | INTRAMUSCULAR | Status: DC

## 2021-10-30 NOTE — Telephone Encounter (Signed)
Spoke with pt advised of Fola's message pt verbalized understanding °

## 2021-11-07 ENCOUNTER — Ambulatory Visit
Admission: RE | Admit: 2021-11-07 | Discharge: 2021-11-07 | Disposition: A | Discharge status: Home / Self Care | Payer: 59 | Source: Ambulatory Visit | Attending: Nurse Practitioner | Admitting: Nurse Practitioner

## 2021-11-07 DIAGNOSIS — Z1231 Encounter for screening mammogram for malignant neoplasm of breast: Secondary | ICD-10-CM | POA: Diagnosis not present

## 2021-11-09 NOTE — Progress Notes (Signed)
Normal test repeat in 1 year

## 2021-11-12 ENCOUNTER — Telehealth: Payer: Self-pay | Admitting: Nurse Practitioner

## 2021-11-12 DIAGNOSIS — M533 Sacrococcygeal disorders, not elsewhere classified: Secondary | ICD-10-CM | POA: Diagnosis not present

## 2021-11-12 NOTE — Telephone Encounter (Signed)
Pt returning call for results

## 2021-11-12 NOTE — Telephone Encounter (Signed)
Spoke with pt

## 2021-11-19 DIAGNOSIS — M533 Sacrococcygeal disorders, not elsewhere classified: Secondary | ICD-10-CM | POA: Diagnosis not present

## 2021-11-23 ENCOUNTER — Encounter: Payer: Self-pay | Admitting: Nurse Practitioner

## 2021-11-23 ENCOUNTER — Ambulatory Visit (INDEPENDENT_AMBULATORY_CARE_PROVIDER_SITE_OTHER): Payer: 59 | Admitting: Nurse Practitioner

## 2021-11-23 VITALS — BP 123/76 | HR 79 | Ht 62.0 in | Wt 180.0 lb

## 2021-11-23 DIAGNOSIS — R0683 Snoring: Secondary | ICD-10-CM | POA: Diagnosis not present

## 2021-11-23 DIAGNOSIS — G4700 Insomnia, unspecified: Secondary | ICD-10-CM | POA: Insufficient documentation

## 2021-11-23 MED ORDER — HYDROXYZINE PAMOATE 50 MG PO CAPS: 50.0000 mg | ORAL_CAPSULE | Freq: Every day | ORAL | 0 refills | Status: DC | PRN

## 2021-11-23 NOTE — Assessment & Plan Note (Signed)
Rx hydroxyzine 50 mg as needed at bedtime Sleep study ordered due to complaints of snoring and insomnia Sleep hygiene discussed.

## 2021-11-23 NOTE — Assessment & Plan Note (Signed)
Has insomnia, states that she is not sometimes Patient agreed to doing the sleep study due to her complaints of insomnia and snoring Sleep study ordered

## 2021-11-23 NOTE — Patient Instructions (Addendum)
Please take hydroxyzine '50mg'$  ,  30-60 minutes daily at bedtime as needed for sleep  Pleas order sleep study test today   It is important that you exercise regularly at least 30 minutes 5 times a week.  Think about what you will eat, plan ahead. Choose " clean, green, fresh or frozen" over canned, processed or packaged foods which are more sugary, salty and fatty. 70 to 75% of food eaten should be vegetables and fruit. Three meals at set times with snacks allowed between meals, but they must be fruit or vegetables. Aim to eat over a 12 hour period , example 7 am to 7 pm, and STOP after  your last meal of the day. Drink water,generally about 64 ounces per day, no other drink is as healthy. Fruit juice is best enjoyed in a healthy way, by EATING the fruit.  Thanks for choosing [facility], we consider it a privelige to serve you.

## 2021-11-23 NOTE — Progress Notes (Signed)
   [name]     MRN: [id number]      DOB: 10-03-1960   HPI [name] with past medical history of hypertension, GERD, radiculopathy, obesity is here for complaints of insomnia was seen in the last 1 month.  Patient stated that she gets about 3 hours of sleep nightly feels sleepy and tired when she wakes up in the morning patient reports that she snores sometimes.  She does not drink alcohol and does not smoke, drinks coffee only in the morning.  She has not tried any medication for her insomnia.    ROS Denies recent fever or chills. Denies sinus pressure, nasal congestion, ear pain or sore throat. Denies chest congestion, productive cough or wheezing. Denies chest pains, palpitations and leg swelling Denies abdominal pain, nausea, vomiting,diarrhea or constipation.   Denies headaches, seizures, numbness, or tingling. Denies depression, anxiety.    PE  BP 123/76 (BP Location: Left Arm, Patient Position: Sitting, Cuff Size: Normal)   Pulse 79   Ht '5\' 2"'$  (1.575 m)   Wt 180 lb (81.6 kg)   LMP 10/26/2016 (Exact Date)   SpO2 95%   BMI 32.92 kg/m   Patient alert and oriented and in no cardiopulmonary distress.  Chest: Clear to auscultation bilaterally.  CVS: S1, S2 no murmurs, no S3.Regular rate.  ABD: Soft non tender.   Ext: No edema  MS: Adequate ROM spine, shoulders, hips and knees.  Psych: Good eye contact, normal affect. Memory intact not anxious or depressed appearing.  CNS: CN 2-12 intact, power,  normal throughout.no focal deficits noted.   Assessment & Plan  Snoring Has insomnia, states that she is not sometimes Patient agreed to doing the sleep study due to her complaints of insomnia and snoring Sleep study ordered  Insomnia Rx hydroxyzine 50 mg as needed at bedtime Sleep study ordered due to complaints of snoring and insomnia Sleep hygiene discussed.

## 2021-11-26 ENCOUNTER — Other Ambulatory Visit: Payer: Self-pay

## 2021-11-26 ENCOUNTER — Telehealth: Payer: Self-pay

## 2021-11-26 MED ORDER — ATORVASTATIN CALCIUM 20 MG PO TABS: 20.0000 mg | ORAL_TABLET | Freq: Every day | ORAL | 0 refills | Status: DC

## 2021-11-26 NOTE — Telephone Encounter (Signed)
Patient called asking to change to hydrOXYzine HCL 25 MG capsule Instead of hydrOXYzine (VISTARIL) 50 MG capsule because makes patient so drowsy.   Need refill atorvastatin (LIPITOR) 20 MG tablet    Pharmacy: [facility]

## 2021-11-26 NOTE — Telephone Encounter (Signed)
Please advise. Called pt no answer left vm to let her know I've sent lipitor waiting on response of medication change

## 2021-11-27 ENCOUNTER — Telehealth: Payer: Self-pay | Admitting: Nurse Practitioner

## 2021-11-27 ENCOUNTER — Other Ambulatory Visit: Payer: Self-pay

## 2021-11-27 ENCOUNTER — Other Ambulatory Visit: Payer: Self-pay | Admitting: Nurse Practitioner

## 2021-11-27 MED ORDER — HYDROXYZINE PAMOATE 25 MG PO CAPS: 25.0000 mg | ORAL_CAPSULE | Freq: Three times a day (TID) | ORAL | 0 refills | Status: DC | PRN

## 2021-11-27 MED ORDER — HYDROXYZINE PAMOATE 25 MG PO CAPS: 25.0000 mg | ORAL_CAPSULE | Freq: Every evening | ORAL | 0 refills | Status: DC | PRN

## 2021-11-27 MED ORDER — ACYCLOVIR 400 MG PO TABS: 400.0000 mg | ORAL_TABLET | Freq: Every day | ORAL | 1 refills | Status: DC | PRN

## 2021-11-27 NOTE — Progress Notes (Unsigned)
Hydrox

## 2021-11-27 NOTE — Telephone Encounter (Signed)
Patient aware.

## 2021-11-27 NOTE — Telephone Encounter (Signed)
Pt called stating that the medication she was given is too strong. Unable to figure out what the name of med is. Can you please call pt when available?

## 2021-12-05 ENCOUNTER — Encounter: Payer: Self-pay | Admitting: Gastroenterology

## 2021-12-05 ENCOUNTER — Telehealth: Payer: Self-pay

## 2021-12-05 ENCOUNTER — Ambulatory Visit: Payer: 59 | Admitting: Gastroenterology

## 2021-12-05 VITALS — BP 124/78 | HR 84 | Temp 97.5°F | Ht 62.5 in | Wt 181.2 lb

## 2021-12-05 DIAGNOSIS — K219 Gastro-esophageal reflux disease without esophagitis: Secondary | ICD-10-CM

## 2021-12-05 DIAGNOSIS — M545 Low back pain, unspecified: Secondary | ICD-10-CM | POA: Diagnosis not present

## 2021-12-05 DIAGNOSIS — A048 Other specified bacterial intestinal infections: Secondary | ICD-10-CM | POA: Insufficient documentation

## 2021-12-05 DIAGNOSIS — K5900 Constipation, unspecified: Secondary | ICD-10-CM | POA: Diagnosis not present

## 2021-12-05 MED ORDER — POLYETHYLENE GLYCOL 3350 17 GM/SCOOP PO POWD: ORAL | 11 refills | Status: DC

## 2021-12-05 NOTE — Patient Instructions (Signed)
Call back with medication name you want my opinion about for hot flashes.  Stop pantoprazole for 2 weeks. Then collect stool for H.pylori testing. You will need to get container from [facility] lab. Once you collect stool, you can restart pantoprazole. If you have heartburn while off pantoprazole, you can take TUMS or Pepcid (famotidine) which are both over the counter.  Limit foods from the "Foods to avoid" list below as they can cause more gas.   Low-FODMAP Eating Plan  FODMAP stands for fermentable oligosaccharides, disaccharides, monosaccharides, and polyols. These are sugars that are hard for some people to digest. A low-FODMAP eating plan may help some people who have irritable bowel syndrome (IBS) and certain other bowel (intestinal) diseases to manage their symptoms. This meal plan can be complicated to follow. Work with a diet and nutrition specialist (dietitian) to make a low-FODMAP eating plan that is right for you. A dietitian can help make sure that you get enough nutrition from this diet. What are tips for following this plan? Reading food labels Check labels for hidden FODMAPs such as: High-fructose syrup. Honey. Agave. Natural fruit flavors. Onion or garlic powder. Choose low-FODMAP foods that contain 3-4 grams of fiber per serving. Check food labels for serving sizes. Eat only one serving at a time to make sure FODMAP levels stay low. Shopping Shop with a list of foods that are recommended on this diet and make a meal plan. Meal planning Follow a low-FODMAP eating plan for up to 6 weeks, or as told by your health care provider or dietitian. To follow the eating plan: Eliminate high-FODMAP foods from your diet completely. Choose only low-FODMAP foods to eat. You will do this for 2-6 weeks. Gradually reintroduce high-FODMAP foods into your diet one at a time. Most people should wait a few days before introducing the next new high-FODMAP food into their meal plan. Your dietitian  can recommend how quickly you may reintroduce foods. Keep a daily record of what and how much you eat and drink. Make note of any symptoms that you have after eating. Review your daily record with a dietitian regularly to identify which foods you can eat and which foods you should avoid. General tips Drink enough fluid each day to keep your urine pale yellow. Avoid processed foods. These often have added sugar and may be high in FODMAPs. Avoid most dairy products, whole grains, and sweeteners. Work with a dietitian to make sure you get enough fiber in your diet. Avoid high FODMAP foods at meals to manage symptoms. Recommended foods Fruits Bananas, oranges, tangerines, lemons, limes, blueberries, raspberries, strawberries, grapes, cantaloupe, honeydew melon, kiwi, papaya, passion fruit, and pineapple. Limited amounts of dried cranberries, banana chips, and shredded coconut. Vegetables Eggplant, zucchini, cucumber, peppers, green beans, bean sprouts, lettuce, arugula, kale, Swiss chard, spinach, collard greens, bok choy, summer squash, potato, and tomato. Limited amounts of corn, carrot, and sweet potato. Green parts of scallions. Grains Gluten-free grains, such as rice, oats, buckwheat, quinoa, corn, polenta, and millet. Gluten-free pasta, bread, or cereal. Rice noodles. Corn tortillas. Meats and other proteins Unseasoned beef, pork, poultry, or fish. Eggs. Berniece Salines. Tofu (firm) and tempeh. Limited amounts of nuts and seeds, such as almonds, walnuts, [place] nuts, pecans, peanuts, nut butters, pumpkin seeds, chia seeds, and sunflower seeds. Dairy Lactose-free milk, yogurt, and kefir. Lactose-free cottage cheese and ice cream. Non-dairy milks, such as almond, coconut, hemp, and rice milk. Non-dairy yogurt. Limited amounts of goat cheese, brie, mozzarella, parmesan, swiss, and other hard cheeses.  Fats and oils Butter-free spreads. Vegetable oils, such as olive, canola, and sunflower oil. Seasoning and  other foods Artificial sweeteners with names that do not end in "ol," such as aspartame, saccharine, and stevia. Maple syrup, white table sugar, raw sugar, brown sugar, and molasses. Mayonnaise, soy sauce, and tamari. Fresh basil, coriander, parsley, rosemary, and thyme. Beverages Water and mineral water. Sugar-sweetened soft drinks. Small amounts of orange juice or cranberry juice. Black and green tea. Most dry wines. Coffee. The items listed above may not be a complete list of foods and beverages you can eat. Contact a dietitian for more information. Foods to avoid Fruits Fresh, dried, and juiced forms of apple, pear, watermelon, peach, plum, cherries, apricots, blackberries, boysenberries, figs, nectarines, and mango. Avocado. Vegetables Chicory root, artichoke, asparagus, cabbage, snow peas, Brussels sprouts, broccoli, sugar snap peas, mushrooms, celery, and cauliflower. Onions, garlic, leeks, and the white part of scallions. Grains Wheat, including kamut, durum, and semolina. Barley and bulgur. Couscous. Wheat-based cereals. Wheat noodles, bread, crackers, and pastries. Meats and other proteins Fried or fatty meat. Sausage. Cashews and pistachios. Soybeans, baked beans, black beans, chickpeas, kidney beans, fava beans, navy beans, lentils, black-eyed peas, and split peas. Dairy Milk, yogurt, ice cream, and soft cheese. Cream and sour cream. Milk-based sauces. Custard. Buttermilk. Soy milk. Seasoning and other foods Any sugar-free gum or candy. Foods that contain artificial sweeteners such as sorbitol, mannitol, isomalt, or xylitol. Foods that contain honey, high-fructose corn syrup, or agave. Bouillon, vegetable stock, beef stock, and chicken stock. Garlic and onion powder. Condiments made with onion, such as hummus, chutney, pickles, relish, salad dressing, and salsa. Tomato paste. Beverages Chicory-based drinks. Coffee substitutes. Chamomile tea. Fennel tea. Sweet or fortified wines such as  port or sherry. Diet soft drinks made with isomalt, mannitol, maltitol, sorbitol, or xylitol. Apple, pear, and mango juice. Juices with high-fructose corn syrup. The items listed above may not be a complete list of foods and beverages you should avoid. Contact a dietitian for more information. Summary FODMAP stands for fermentable oligosaccharides, disaccharides, monosaccharides, and polyols. These are sugars that are hard for some people to digest. A low-FODMAP eating plan is a short-term diet that helps to ease symptoms of certain bowel diseases. The eating plan usually lasts up to 6 weeks. After that, high-FODMAP foods are reintroduced gradually and one at a time. This can help you find out which foods may be causing symptoms. A low-FODMAP eating plan can be complicated. It is best to work with a dietitian who has experience with this type of plan. This information is not intended to replace advice given to you by your health care provider. Make sure you discuss any questions you have with your health care provider. Document Revised: 10/14/2019 Document Reviewed: 10/14/2019 Elsevier Patient Education  [employer].

## 2021-12-05 NOTE — Progress Notes (Signed)
GI Office Note    Referring Provider: Renee Rival, FNP Primary Care Physician:  Renee Rival, FNP  Primary Gastroenterologist: Garfield Cornea, MD   Chief Complaint   Chief Complaint  Patient presents with   Follow-up    Patient states that she has been having some issues a couple times a week with reflux    History of Present Illness   Jacqueline [name] is a 61 y.o. female presenting today for follow up. Last seen in office in 04/2021.  History of GERD, dysphagia, LPR.  Colonoscopy up-to-date, due in 2026.  Last EGD 2022, H.pylori gastritis, treated with Prevpac equivalent.   Patient states someone told her to stop Prevpac and she didn't take complete course. Patient is on Pantoprazole '40mg'$  BID. Continues to have breakthrough symptoms, couple of times per week will have heartburn/regurgitation. Sometimes has to vomit with it. No dysphagia. No abdominal pain. Has a lot of gas. Chews gum and sucks on candy while at work. BM every couple of days. Sometimes straining. No melena, brbpr. Wanted to know my thoughts about a medication someone recommended to her for hot flashes but she can't remember the name. Also wondered if ok to try Keto + ACV Profit for weight loss.  EGD December 2022: - Web in the distal esophagus. Dilated. - Gastritis. Biopsied.  Mildly active H. pylori gastritis.   - Normal duodenal bulb, first portion of the duodenum and second portion of the duodenum.   Medications   Current Outpatient Medications  Medication Sig Dispense Refill   acyclovir (ZOVIRAX) 400 MG tablet Take 1 tablet (400 mg total) by mouth daily as needed (outbreaks). 30 tablet 1   amLODipine (NORVASC) 5 MG tablet Take 1 tablet (5 mg total) by mouth daily. 90 tablet 1   atorvastatin (LIPITOR) 20 MG tablet Take 1 tablet (20 mg total) by mouth daily. 90 tablet 0   estradiol (ESTRACE) 0.1 MG/GM vaginal cream INSERT 2 GRAMS VAGINALLY AS DIRECTED EVERY OTHER NIGHT 43 g 11   fluticasone  (FLONASE) 50 MCG/ACT nasal spray Use 2 sprays in each nostril BID for a week. After 1 week, decrease to 1 spray in each nostril BID as needed for congestion/allergies. (Patient taking differently: 1 spray 2 (two) times daily as needed (sinus drainage/congestion.). As needed) 16 g 6   hydrOXYzine (VISTARIL) 25 MG capsule Take 1 capsule (25 mg total) by mouth at bedtime as needed. 30 capsule 0   meloxicam (MOBIC) 7.5 MG tablet Take 1 tablet (7.5 mg total) by mouth daily. 90 tablet 0   Multiple Vitamin (MULTIVITAMIN WITH MINERALS) TABS tablet Take 1 tablet by mouth daily with supper. One-A-Day Multivitamin     pantoprazole (PROTONIX) 40 MG tablet Take 1 tablet (40 mg total) by mouth 2 (two) times daily before a meal. 180 tablet 3   triamcinolone cream (KENALOG) 0.1 % Apply 1 application. topically 2 (two) times daily. 30 g 0   No current facility-administered medications for this visit.    Allergies   Allergies as of 12/05/2021   (No Known Allergies)      Review of Systems   General: Negative for anorexia, weight loss, fever, chills, fatigue, weakness. ENT: Negative for hoarseness, difficulty swallowing , nasal congestion. CV: Negative for chest pain, angina, palpitations, dyspnea on exertion, peripheral edema.  Respiratory: Negative for dyspnea at rest, dyspnea on exertion, cough, sputum, wheezing.  GI: See history of present illness. GU:  Negative for dysuria, hematuria, urinary incontinence, urinary frequency, nocturnal  urination.  Endo: Negative for unusual weight change.     Physical Exam   BP 124/78 (BP Location: Left Arm, Patient Position: Sitting, Cuff Size: Normal)   Pulse 84   Temp (!) 97.5 F (36.4 C) (Temporal)   Ht 5' 2.5" (1.588 m)   Wt 181 lb 3.2 oz (82.2 kg)   LMP 10/26/2016 (Exact Date)   SpO2 98%   BMI 32.61 kg/m    General: Well-nourished, well-developed in no acute distress.  Eyes: No icterus. Mouth: Oropharyngeal mucosa moist and pink , no lesions erythema  or exudate. Lungs: Clear to auscultation bilaterally.  Heart: Regular rate and rhythm, no murmurs rubs or gallops.  Abdomen: Bowel sounds are normal, nontender, nondistended, no hepatosplenomegaly or masses,  no abdominal bruits or hernia , no rebound or guarding.  Rectal: not performed  Extremities: No lower extremity edema. No clubbing or deformities. Neuro: Alert and oriented x 4   Skin: Warm and dry, no jaundice.   Psych: Alert and cooperative, normal mood and affect.  Labs   Lab Results  Component Value Date   CREATININE 1.00 10/17/2021   BUN 10 10/17/2021   NA 140 10/17/2021   K 3.9 10/17/2021   CL 103 10/17/2021   CO2 25 10/17/2021   Lab Results  Component Value Date   ALT 15 10/17/2021   AST 23 10/17/2021   ALKPHOS 96 10/17/2021   BILITOT 0.8 10/17/2021   Lab Results  Component Value Date   TSH 0.686 10/17/2021   Lab Results  Component Value Date   HGBA1C 5.9 (H) 10/17/2021   Lab Results  Component Value Date   WBC 7.4 07/05/2021   HGB 13.2 07/05/2021   HCT 39.5 07/05/2021   MCV 91 07/05/2021   PLT 243 07/05/2021    Imaging Studies   MM 3D SCREEN BREAST BILATERAL  Result Date: 11/09/2021 CLINICAL DATA:  Screening. EXAM: DIGITAL SCREENING BILATERAL MAMMOGRAM WITH TOMOSYNTHESIS AND CAD TECHNIQUE: Bilateral screening digital craniocaudal and mediolateral oblique mammograms were obtained. Bilateral screening digital breast tomosynthesis was performed. The images were evaluated with computer-aided detection. COMPARISON:  Previous exam(s). ACR Breast Density Category b: There are scattered areas of fibroglandular density. FINDINGS: There are no findings suspicious for malignancy. IMPRESSION: No mammographic evidence of malignancy. A result letter of this screening mammogram will be mailed directly to the patient. RECOMMENDATION: Screening mammogram in one year. (Code:SM-B-01Y) BI-RADS CATEGORY  1: Negative. Electronically Signed   By: Franki Cabot M.D.   On:  11/09/2021 09:38    Assessment   GERD/H.pylori: overall doing ok but occasional breakthrough symptoms associated with vomiting. Reinforced antireflux measures. Need to make sure H.pylori was adequately treated as she is not sure she completed treatment.   Constipation: mild symptoms. Complains of gas/flatulence. May improve with better management of BMs. Consider backing off on gum/hard candies. Avoid high FODMAP foods. Treat constipation.     PLAN   Miralax 17 grams daily as needed.  Limit high FODMAP foods. H.pylori stool antigen. Hold PPI for 2 weeks before stool collection. Can use TUMS or Pepcid if needed. She will call back with medication for hot flashes and we will provide more information.    Laureen Ochs. Bobby Rumpf, [place], [facility]

## 2021-12-05 NOTE — Telephone Encounter (Signed)
Please let pt know that I cannot really recommend either black cohosh root or the keto+ACV that she was asking about today during ov as they are not FDA reviewed for safety or efficacy.

## 2021-12-05 NOTE — Telephone Encounter (Signed)
Pt was seen for an appt this morning and was instructed to call back with the name of a medication that she was taking. Pt states that the name of the medication is black cohosh root and is wanting your thoughts on it.

## 2021-12-06 NOTE — Telephone Encounter (Signed)
Lmom for pt to return my call.  

## 2021-12-07 NOTE — Telephone Encounter (Signed)
Pt was made aware.  

## 2021-12-07 NOTE — Telephone Encounter (Signed)
Lmom for pt to return my call.  

## 2021-12-10 DIAGNOSIS — M545 Low back pain, unspecified: Secondary | ICD-10-CM | POA: Diagnosis not present

## 2021-12-14 ENCOUNTER — Telehealth: Payer: Self-pay | Admitting: Nurse Practitioner

## 2021-12-14 NOTE — Telephone Encounter (Signed)
Pt called stating that someone from her spoke to her this am when she was at work about getting a sleep study?

## 2021-12-14 NOTE — Telephone Encounter (Signed)
Spoke with pt provided number to contact [facility]

## 2021-12-14 NOTE — Telephone Encounter (Signed)
Called pt gave her the number again

## 2021-12-14 NOTE — Telephone Encounter (Signed)
Pt returning call states number she was given is not working

## 2021-12-19 ENCOUNTER — Other Ambulatory Visit: Payer: Self-pay | Admitting: Nurse Practitioner

## 2021-12-21 ENCOUNTER — Encounter: Payer: Self-pay | Admitting: Nurse Practitioner

## 2021-12-21 ENCOUNTER — Ambulatory Visit (INDEPENDENT_AMBULATORY_CARE_PROVIDER_SITE_OTHER): Payer: 59 | Admitting: Nurse Practitioner

## 2021-12-21 ENCOUNTER — Other Ambulatory Visit (HOSPITAL_COMMUNITY)
Admission: RE | Admit: 2021-12-21 | Discharge: 2021-12-21 | Disposition: A | Discharge status: Home / Self Care | Payer: 59 | Source: Ambulatory Visit | Attending: Nurse Practitioner | Admitting: Nurse Practitioner

## 2021-12-21 VITALS — BP 121/81 | HR 79 | Ht 63.0 in | Wt 178.0 lb

## 2021-12-21 DIAGNOSIS — I10 Essential (primary) hypertension: Secondary | ICD-10-CM

## 2021-12-21 DIAGNOSIS — Z124 Encounter for screening for malignant neoplasm of cervix: Secondary | ICD-10-CM | POA: Insufficient documentation

## 2021-12-21 DIAGNOSIS — G4700 Insomnia, unspecified: Secondary | ICD-10-CM | POA: Diagnosis not present

## 2021-12-21 MED ORDER — HYDROXYZINE HCL 25 MG PO TABS: 25.0000 mg | ORAL_TABLET | Freq: Every evening | ORAL | 0 refills | Status: DC | PRN

## 2021-12-21 NOTE — Assessment & Plan Note (Signed)
Condition well-controlled on amlodipine 5 mg daily continue current medication DASH diet advised engage in regular walking exercises at least 150 minutes weekly Follow-up in 6 months

## 2021-12-21 NOTE — Progress Notes (Deleted)
Complete physical exam  Patient: Jacqueline [name]   DOB: [birth year]   62 y.o. Female  MRN: [id number]  Subjective:    Chief Complaint  Patient presents with   Annual Exam    cpe   Gynecologic Exam    Exilda Wilhite Savona is a 61 y.o. female who presents today for a complete physical exam.  Still not sleeping well.        Most recent fall risk assessment:    12/21/2021    8:58 AM  [place] in the past year? 0  Number falls in past yr: 0  Injury with Fall? 0  Risk for fall due to : No Fall Risks  Follow up Falls evaluation completed     Most recent depression screenings:    12/21/2021    8:58 AM 11/23/2021    4:42 PM  PHQ 2/9 Scores  PHQ - 2 Score 0 0    {VISON DENTAL STD PSA (Optional):27386}  {History (Optional):23778}  Patient Care Team: Renee Rival, FNP as PCP - General (Nurse Practitioner) Daneil Dolin, MD as Consulting Physician (Gastroenterology)   Outpatient Medications Prior to Visit  Medication Sig   acyclovir (ZOVIRAX) 400 MG tablet TAKE 1 TABLET (400 MG TOTAL) BY MOUTH DAILY AS NEEDED (OUTBREAKS).   amLODipine (NORVASC) 5 MG tablet Take 1 tablet (5 mg total) by mouth daily.   atorvastatin (LIPITOR) 20 MG tablet Take 1 tablet (20 mg total) by mouth daily.   estradiol (ESTRACE) 0.1 MG/GM vaginal cream INSERT 2 GRAMS VAGINALLY AS DIRECTED EVERY OTHER NIGHT   fluticasone (FLONASE) 50 MCG/ACT nasal spray Use 2 sprays in each nostril BID for a week. After 1 week, decrease to 1 spray in each nostril BID as needed for congestion/allergies. (Patient taking differently: 1 spray 2 (two) times daily as needed (sinus drainage/congestion.). As needed)   hydrOXYzine (VISTARIL) 25 MG capsule Take 1 capsule (25 mg total) by mouth at bedtime as needed.   meloxicam (MOBIC) 7.5 MG tablet Take 1 tablet (7.5 mg total) by mouth daily.   Multiple Vitamin (MULTIVITAMIN WITH MINERALS) TABS tablet Take 1 tablet by mouth daily with supper. One-A-Day Multivitamin    pantoprazole (PROTONIX) 40 MG tablet Take 1 tablet (40 mg total) by mouth 2 (two) times daily before a meal. (Patient not taking: Reported on 12/21/2021)   polyethylene glycol powder (MIRALAX) 17 GM/SCOOP powder Take one capful (17 grams) once daily as needed to maintain soft stool (Patient not taking: Reported on 12/21/2021)   triamcinolone cream (KENALOG) 0.1 % Apply 1 application. topically 2 (two) times daily. (Patient not taking: Reported on 12/21/2021)   No facility-administered medications prior to visit.    ROS        Objective:     BP 121/81 (BP Location: Right Arm, Patient Position: Sitting, Cuff Size: Normal)   Pulse 79   Ht '5\' 3"'$  (1.6 m)   Wt 178 lb (80.7 kg)   LMP 10/26/2016 (Exact Date)   SpO2 96%   BMI 31.53 kg/m  {Vitals History (Optional):23777}  Physical Exam   No results found for any visits on 12/21/21. {Show previous labs (optional):23779}    Assessment & Plan:    Routine Health Maintenance and Physical Exam  Immunization History  Administered Date(s) Administered   Hep A / Hep B 11/17/2006, 01/21/2007, 07/01/2007   Influenza Inj Mdck Quad With Preservative 04/04/2020   Influenza Whole 06/05/2005   Influenza,inj,Quad PF,6+ Mos 07/05/2021   Influenza-Unspecified 03/18/2014, 03/02/2016,  03/29/2019   Moderna Sars-Covid-2 Vaccination 11/30/2019, 12/30/2019   Td 03/26/1989, 05/21/2005   Tdap 03/29/2016   Zoster Recombinat (Shingrix) 10/16/2021    Health Maintenance  Topic Date Due   Zoster Vaccines- Shingrix (2 of 2) 12/11/2021   PAP SMEAR-Modifier  12/24/2021   INFLUENZA VACCINE  01/08/2022   MAMMOGRAM  11/08/2023   COLONOSCOPY (Pts 45-27yr Insurance coverage will need to be confirmed)  08/24/2024   TETANUS/TDAP  03/29/2026   Hepatitis C Screening  Completed   HIV Screening  Completed   HPV VACCINES  Aged Out   COVID-19 Vaccine  Discontinued    Discussed health benefits of physical activity, and encouraged her to engage in regular exercise  appropriate for her age and condition.  Problem List Items Addressed This Visit   None  No follow-ups on file.     FRenee Rival FNP

## 2021-12-21 NOTE — Assessment & Plan Note (Signed)
Cervical Pap smear done today Specimen sent to the lab Procedure well-tolerated

## 2021-12-21 NOTE — Patient Instructions (Signed)

## 2021-12-21 NOTE — Assessment & Plan Note (Signed)
He had high hopes sleep study test done yesterday Would like to switch to a pill form of hydroxyzine instead of daily Take hydroxyzine 25 mg tablet at bedtime as needed insomnia

## 2021-12-21 NOTE — Progress Notes (Signed)
Established Patient Office Visit  Subjective   Patient ID: Jacqueline [name], female    DOB: [birth year]  Age: 61 y.o. MRN: [id number]  Chief Complaint  Patient presents with   Annual Exam    cpe   Gynecologic Exam   Ms. [name] with past medical history of hypertension, insomnia, hyperlipidemia, obesity presents for gynecologic exam.  She had her CPE 2 months ago   Insomnia.  Patient continues to have trouble with sleeping currently on hydroxyzine 25 mg capsules as needed bedtime for insomnia.  She completed her home sleep study yesterday.  She would like to switch to pill form hydroxyzine instead of the capsule.  Not willing to switch to a totally new medication today.   Due for second dose of shingles vaccine.  Patient refused vaccine today     Review of Systems  Constitutional:  Negative for chills, fever, malaise/fatigue and weight loss.  Respiratory:  Negative for cough, hemoptysis, sputum production, shortness of breath and wheezing.   Cardiovascular:  Negative for chest pain, palpitations, orthopnea and claudication.  Genitourinary:  Negative for flank pain, frequency, hematuria and urgency.  Neurological:  Negative for dizziness, tingling, tremors, sensory change and headaches.  Psychiatric/Behavioral:  Negative for depression, hallucinations, substance abuse and suicidal ideas. The patient is not nervous/anxious.       Objective:     BP 121/81 (BP Location: Right Arm, Patient Position: Sitting, Cuff Size: Normal)   Pulse 79   Ht '5\' 3"'$  (1.6 m)   Wt 178 lb (80.7 kg)   LMP 10/26/2016 (Exact Date)   SpO2 96%   BMI 31.53 kg/m    Physical Exam Exam conducted with a chaperone present.  Constitutional:      General: She is not in acute distress.    Appearance: She is obese. She is not ill-appearing, toxic-appearing or diaphoretic.  Cardiovascular:     Rate and Rhythm: Normal rate and regular rhythm.     Pulses: Normal pulses.     Heart sounds: Normal heart sounds.  No murmur heard.    No friction rub. No gallop.  Pulmonary:     Effort: Pulmonary effort is normal. No respiratory distress.     Breath sounds: Normal breath sounds. No stridor. No wheezing, rhonchi or rales.  Chest:     Chest wall: No tenderness.  Abdominal:     Hernia: There is no hernia in the left inguinal area or right inguinal area.  Genitourinary:    General: Normal vulva.     Exam position: Lithotomy position.     Pubic Area: No rash or pubic lice.      Tanner stage (genital): 5.     Labia:        Right: No rash, tenderness, lesion or injury.        Left: No rash, tenderness, lesion or injury.      Urethra: No prolapse, urethral pain, urethral swelling or urethral lesion.     Vagina: No signs of injury and foreign body. No vaginal discharge, erythema, tenderness, bleeding, lesions or prolapsed vaginal walls.     Cervix: Dilated. No cervical motion tenderness, discharge, friability, lesion, erythema, cervical bleeding or eversion.     Uterus: Not deviated, not enlarged, not fixed, not tender and no uterine prolapse.      Adnexa:        Right: No mass, tenderness or fullness.         Left: No mass, tenderness or fullness.    Lymphadenopathy:  Lower Body: No right inguinal adenopathy. No left inguinal adenopathy.  Skin:    General: Skin is warm and dry.     Capillary Refill: Capillary refill takes less than 2 seconds.     Coloration: Skin is not jaundiced or pale.     Findings: No bruising, erythema, lesion or rash.  Neurological:     Mental Status: She is alert and oriented to person, place, and time.  Psychiatric:        Mood and Affect: Mood normal.        Behavior: Behavior normal.        Thought Content: Thought content normal.        Judgment: Judgment normal.      No results found for any visits on 12/21/21.    The 10-year ASCVD risk score (Arnett DK, et al., 2019) is: 4.1%    Assessment & Plan:   Problem List Items Addressed This Visit        Cardiovascular and Mediastinum   Hypertension    Condition well-controlled on amlodipine 5 mg daily continue current medication DASH diet advised engage in regular walking exercises at least 150 minutes weekly Follow-up in 6 months         Other   Insomnia    He had high hopes sleep study test done yesterday Would like to switch to a pill form of hydroxyzine instead of daily Take hydroxyzine 25 mg tablet at bedtime as needed insomnia      Screening for cervical cancer - Primary    Cervical Pap smear done today Specimen sent to the lab Procedure well-tolerated      Relevant Orders   Cytology - PAP    Return in about 6 months (around 06/23/2022) for HTN/HLD.    Renee Rival, FNP

## 2021-12-27 DIAGNOSIS — M545 Low back pain, unspecified: Secondary | ICD-10-CM | POA: Diagnosis not present

## 2021-12-27 LAB — CYTOLOGY - PAP
Adequacy: ABSENT
Comment: NEGATIVE
Diagnosis: NEGATIVE
High risk HPV: NEGATIVE

## 2021-12-28 ENCOUNTER — Telehealth: Payer: Self-pay | Admitting: Nurse Practitioner

## 2021-12-28 NOTE — Telephone Encounter (Signed)
Spoke with pt

## 2021-12-28 NOTE — Telephone Encounter (Signed)
Patient returning call.

## 2021-12-31 ENCOUNTER — Other Ambulatory Visit (HOSPITAL_COMMUNITY)
Admission: RE | Admit: 2021-12-31 | Discharge: 2021-12-31 | Disposition: A | Discharge status: Home / Self Care | Payer: 59 | Source: Other Acute Inpatient Hospital | Attending: Gastroenterology | Admitting: Gastroenterology

## 2021-12-31 DIAGNOSIS — K219 Gastro-esophageal reflux disease without esophagitis: Secondary | ICD-10-CM | POA: Insufficient documentation

## 2021-12-31 DIAGNOSIS — A048 Other specified bacterial intestinal infections: Secondary | ICD-10-CM | POA: Insufficient documentation

## 2021-12-31 DIAGNOSIS — K5900 Constipation, unspecified: Secondary | ICD-10-CM | POA: Diagnosis not present

## 2022-01-01 DIAGNOSIS — M545 Low back pain, unspecified: Secondary | ICD-10-CM | POA: Diagnosis not present

## 2022-01-02 ENCOUNTER — Other Ambulatory Visit: Payer: Self-pay

## 2022-01-02 ENCOUNTER — Other Ambulatory Visit: Payer: Self-pay | Admitting: Gastroenterology

## 2022-01-02 DIAGNOSIS — R0683 Snoring: Secondary | ICD-10-CM

## 2022-01-02 LAB — H. PYLORI ANTIGEN, STOOL: H. Pylori Stool Ag, Eia: POSITIVE — AB

## 2022-01-02 MED ORDER — PEPTO-BISMOL 262 MG PO TABS: 2.0000 | ORAL_TABLET | Freq: Three times a day (TID) | ORAL | 0 refills | Status: AC

## 2022-01-02 MED ORDER — TETRACYCLINE HCL 500 MG PO CAPS
500.0000 mg | ORAL_CAPSULE | Freq: Three times a day (TID) | ORAL | 0 refills | Status: AC
Start: 2022-01-02 — End: 2022-01-16

## 2022-01-02 MED ORDER — METRONIDAZOLE 250 MG PO TABS: 250.0000 mg | ORAL_TABLET | Freq: Three times a day (TID) | ORAL | 0 refills | Status: AC

## 2022-01-03 DIAGNOSIS — M545 Low back pain, unspecified: Secondary | ICD-10-CM | POA: Diagnosis not present

## 2022-01-04 ENCOUNTER — Telehealth: Payer: Self-pay | Admitting: Nurse Practitioner

## 2022-01-04 NOTE — Telephone Encounter (Signed)
Pt returning call

## 2022-01-04 NOTE — Telephone Encounter (Signed)
Spoke with pt no one called her it was another office

## 2022-01-08 ENCOUNTER — Telehealth: Payer: Self-pay | Admitting: Internal Medicine

## 2022-01-08 ENCOUNTER — Other Ambulatory Visit: Payer: Self-pay

## 2022-01-08 DIAGNOSIS — M545 Low back pain, unspecified: Secondary | ICD-10-CM | POA: Diagnosis not present

## 2022-01-08 MED ORDER — PANTOPRAZOLE SODIUM 40 MG PO TBEC: 40.0000 mg | DELAYED_RELEASE_TABLET | Freq: Two times a day (BID) | ORAL | 0 refills | Status: DC

## 2022-01-08 NOTE — Telephone Encounter (Signed)
Pt picked up h pylori medication yesterday and is still needing the pantoprazole sent in. Went ahead and sent in a refill on her pantoprazole so that the patient can start the h pylori medication today.

## 2022-01-08 NOTE — Telephone Encounter (Signed)
Pt LMOM after hours that she has questions about her medication. [phone]

## 2022-01-08 NOTE — Telephone Encounter (Signed)
Noted. Agree.

## 2022-01-15 DIAGNOSIS — M6281 Muscle weakness (generalized): Secondary | ICD-10-CM | POA: Diagnosis not present

## 2022-01-15 DIAGNOSIS — M545 Low back pain, unspecified: Secondary | ICD-10-CM | POA: Diagnosis not present

## 2022-01-17 DIAGNOSIS — M6281 Muscle weakness (generalized): Secondary | ICD-10-CM | POA: Diagnosis not present

## 2022-01-17 DIAGNOSIS — M545 Low back pain, unspecified: Secondary | ICD-10-CM | POA: Diagnosis not present

## 2022-01-22 DIAGNOSIS — M6281 Muscle weakness (generalized): Secondary | ICD-10-CM | POA: Diagnosis not present

## 2022-01-22 DIAGNOSIS — M545 Low back pain, unspecified: Secondary | ICD-10-CM | POA: Diagnosis not present

## 2022-01-24 ENCOUNTER — Other Ambulatory Visit: Payer: Self-pay | Admitting: Nurse Practitioner

## 2022-01-30 ENCOUNTER — Telehealth: Payer: Self-pay

## 2022-01-30 NOTE — Telephone Encounter (Signed)
Spoke with pt also spoke with ashly with [employer] he is going to look into her not getting supplies and give me a call back pt aware

## 2022-01-30 NOTE — Telephone Encounter (Signed)
Patient calling about sleep study report has never heard back from anyone about the results. . Call back # [phone]

## 2022-02-01 ENCOUNTER — Other Ambulatory Visit: Payer: Self-pay | Admitting: *Deleted

## 2022-02-01 DIAGNOSIS — R0683 Snoring: Secondary | ICD-10-CM

## 2022-02-04 ENCOUNTER — Other Ambulatory Visit: Payer: Self-pay | Admitting: Nurse Practitioner

## 2022-02-04 DIAGNOSIS — I10 Essential (primary) hypertension: Secondary | ICD-10-CM

## 2022-02-12 NOTE — Telephone Encounter (Signed)
Spoke with pt she hasn't heard anything sending message to [employer]

## 2022-02-12 NOTE — Telephone Encounter (Signed)
Called pt to check to see if she has heard anything on sleep supplies no answer left vm

## 2022-02-15 NOTE — Telephone Encounter (Signed)
Spoke with Jacqueline [name] he states that they spoke with her today

## 2022-02-23 ENCOUNTER — Other Ambulatory Visit: Payer: Self-pay | Admitting: Nurse Practitioner

## 2022-02-27 ENCOUNTER — Encounter: Payer: Self-pay | Admitting: Family Medicine

## 2022-02-27 ENCOUNTER — Ambulatory Visit (INDEPENDENT_AMBULATORY_CARE_PROVIDER_SITE_OTHER): Payer: 59 | Admitting: Family Medicine

## 2022-02-27 VITALS — BP 133/82 | HR 78 | Ht 62.5 in | Wt 180.1 lb

## 2022-02-27 DIAGNOSIS — I10 Essential (primary) hypertension: Secondary | ICD-10-CM

## 2022-02-27 DIAGNOSIS — Z23 Encounter for immunization: Secondary | ICD-10-CM | POA: Diagnosis not present

## 2022-02-27 NOTE — Patient Instructions (Addendum)
I appreciate the opportunity to provide care to you today!    Follow up:  3 weeks  No changes to BP medications   Please check your BP at home and bring readings at your next appt  BP goal is <140/90   Please continue to a heart-healthy diet and increase your physical activities. Try to exercise for 59mns at least three times a week.      It was a pleasure to see you and I look forward to continuing to work together on your health and well-being. Please do not hesitate to call the office if you need care or have questions about your care.   Have a wonderful day and week. With Gratitude, GAlvira MondayMSN, FNP-BC

## 2022-02-27 NOTE — Progress Notes (Unsigned)
Established Patient Office Visit  Subjective:  Patient ID: Jacqueline [name], female    DOB: [birth year]  Age: 61 y.o. MRN: [id number]  CC:  Chief Complaint  Patient presents with   Dizziness    Pt c/o high bp readings at home, also states she has been feeling lightheadedness her head hurts, last bp check at home was 138/95.    HPI Jacqueline [name] is a 61 y.o. female with past medical history of hypertension, GERD, Nausea and vomiting presents for f/u of  chronic medical conditions.  HTN: Stable readings in the clinic. She takes amlodipine 5 mg daily. She reports not taking her BP medication at the same time each day. She reports a high-sodium diet with decreased physical activity. She c/o of feeling lightheaded and denies chest pain and visual disturbances.    Past Medical History:  Diagnosis Date   Anemia    Arthritis    per patient, back   Complication of anesthesia 2018   per patient HR dropped during partial hysterectomy surgery   Fibroid uterus    GERD (gastroesophageal reflux disease)    H/O total hysterectomy    Hyperlipidemia    Borderline   Hypertension    Numbness and tingling in right hand    Pre-diabetes    borderline    Past Surgical History:  Procedure Laterality Date   BACK SURGERY     BALLOON DILATION N/A 05/22/2021   Procedure: BALLOON DILATION;  Surgeon: Eloise Harman, DO;  [facility];  Service: Endoscopy;  Laterality: N/A;   CHOLECYSTECTOMY     COLONOSCOPY  2003   Dr. Gala Romney: Anal canal hemorrhoids   COLONOSCOPY N/A 08/25/2014   Normal colonoscopy.  Next colonoscopy in 2026.  Procedure: COLONOSCOPY;  Surgeon: Daneil Dolin, MD;  [facility];  Service: Endoscopy;  Laterality: N/A;  1200   ESOPHAGOGASTRODUODENOSCOPY N/A 08/25/2014   Procedure: ESOPHAGOGASTRODUODENOSCOPY (EGD);  Surgeon: Daneil Dolin, MD;  [facility];  Service: Endoscopy;  Laterality: N/A;   ESOPHAGOGASTRODUODENOSCOPY (EGD) WITH PROPOFOL N/A  05/22/2021   Web and distal esophagus, dilated, gastriti, biopsy positive for mildly active H. pylori gastritis.  s procedure: ESOPHAGOGASTRODUODENOSCOPY (EGD) WITH PROPOFOL;  Surgeon: Eloise Harman, DO;  [facility];  Service: Endoscopy;  Laterality: N/A;  9:15am   SALPINGOOPHORECTOMY Bilateral 11/06/2016   Procedure: BILATERAL SALPINGO OOPHORECTOMY;  Surgeon: Florian Buff, MD;  Location: [facility];  Service: Gynecology;  Laterality: Bilateral;   SUPRACERVICAL ABDOMINAL HYSTERECTOMY N/A 11/06/2016   Procedure: HYSTERECTOMY SUPRACERVICAL ABDOMINAL;  Surgeon: Florian Buff, MD;  Location: [facility];  Service: Gynecology;  Laterality: N/A;   TRANSFORAMINAL LUMBAR INTERBODY FUSION (TLIF) WITH PEDICLE SCREW FIXATION 2 LEVEL Left 07/28/2019   Procedure: LEFT-SIDED LUMBAR 4- 5, LUMBAR 5 -SACRUM1 TRANSFORAMINAL LUMBAR INTERBODY FUSION WITH INSTRUMENTATION AND ALLOGRAFT;  Surgeon: Phylliss Bob, MD;  Location: [place];  Service: Orthopedics;  Laterality: Left;   TUBAL LIGATION      Family History  Problem Relation Age of Onset   Breast cancer Maternal Aunt    Hypertension Maternal Aunt    Cancer Maternal Aunt        Breast   Cancer Maternal Grandmother         lymphnodes   Colon cancer Neg Hx     Social History   Socioeconomic History   Marital status: Single    Spouse name: Not on file   Number of children: 3   Years of education:  Not on file   Highest education level: Not on file  Occupational History   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   Smoking status: Never   Smokeless tobacco: Never   Tobacco comments:    Never smoked  Vaping Use   Vaping Use: Never used  Substance and Sexual Activity   Alcohol use: No    Alcohol/week: 0.0 standard drinks of alcohol   Drug use: No   Sexual activity: Yes    Birth control/protection: Post-menopausal, Surgical  Other Topics Concern   Not on file  Social History Narrative   Lives with her spouse, work part at [employer] Strain: Not on file  Food Insecurity: Not on file  Transportation Needs: Not on file  Physical Activity: Not on file  Stress: Not on file  Social Connections: Not on file  Intimate Partner Violence: Not on file    Outpatient Medications Prior to Visit  Medication Sig Dispense Refill   acyclovir (ZOVIRAX) 400 MG tablet TAKE 1 TABLET (400 MG TOTAL) BY MOUTH DAILY AS NEEDED (OUTBREAKS). 30 tablet 1   amLODipine (NORVASC) 5 MG tablet Take 1 tablet by mouth once daily 90 tablet 0   atorvastatin (LIPITOR) 20 MG tablet Take 1 tablet (20 mg total) by mouth daily. 90 tablet 0   estradiol (ESTRACE) 0.1 MG/GM vaginal cream INSERT 2 GRAMS VAGINALLY AS DIRECTED EVERY OTHER NIGHT 43 g 11   fluticasone (FLONASE) 50 MCG/ACT nasal spray Use 2 sprays in each nostril BID for a week. After 1 week, decrease to 1 spray in each nostril BID as needed for congestion/allergies. (Patient taking differently: 1 spray 2 (two) times daily as needed (sinus drainage/congestion.). As needed) 16 g 6   hydrOXYzine (ATARAX) 25 MG tablet Take 1 tablet (25 mg total) by mouth at bedtime as needed. 30 tablet 0   meloxicam (MOBIC) 7.5 MG tablet Take 1 tablet (7.5 mg total) by mouth daily. 90 tablet 0   Multiple Vitamin (MULTIVITAMIN WITH MINERALS) TABS tablet Take 1 tablet by mouth daily with supper. One-A-Day Multivitamin     pantoprazole (PROTONIX) 40 MG tablet Take 1 tablet (40 mg total) by mouth 2 (two) times daily before a meal. 180 tablet 0   polyethylene glycol powder (MIRALAX) 17 GM/SCOOP powder Take one capful (17 grams) once daily as needed to maintain soft stool 527 g 11   triamcinolone cream (KENALOG) 0.1 % Apply 1 application. topically 2 (two) times daily. 30 g 0   No facility-administered medications prior to visit.    No Known Allergies  ROS Review of Systems  Constitutional:  Negative for fatigue and fever.  Eyes:  Negative for visual disturbance.   Respiratory:  Negative for shortness of breath.   Cardiovascular:  Negative for chest pain and palpitations.  Neurological:  Positive for light-headedness.  Psychiatric/Behavioral:  Negative for self-injury and suicidal ideas.       Objective:    Physical Exam HENT:     Head: Normocephalic.     Right Ear: External ear normal.     Left Ear: External ear normal.  Cardiovascular:     Rate and Rhythm: Normal rate and regular rhythm.     Pulses: Normal pulses.     Heart sounds: Normal heart sounds.  Pulmonary:     Effort: Pulmonary effort is normal.     Breath sounds: Normal breath sounds.  Neurological:     Mental Status: She  is alert.     BP 133/82   Pulse 78   Ht 5' 2.5" (1.588 m)   Wt 180 lb 1.3 oz (81.7 kg)   LMP 10/26/2016 (Exact Date)   SpO2 97%   BMI 32.41 kg/m  Wt Readings from Last 3 Encounters:  02/27/22 180 lb 1.3 oz (81.7 kg)  12/21/21 178 lb (80.7 kg)  12/05/21 181 lb 3.2 oz (82.2 kg)    Lab Results  Component Value Date   TSH 0.686 10/17/2021   Lab Results  Component Value Date   WBC 7.4 07/05/2021   HGB 13.2 07/05/2021   HCT 39.5 07/05/2021   MCV 91 07/05/2021   PLT 243 07/05/2021   Lab Results  Component Value Date   NA 140 10/17/2021   K 3.9 10/17/2021   CO2 25 10/17/2021   GLUCOSE 86 10/17/2021   BUN 10 10/17/2021   CREATININE 1.00 10/17/2021   BILITOT 0.8 10/17/2021   ALKPHOS 96 10/17/2021   AST 23 10/17/2021   ALT 15 10/17/2021   PROT 7.2 10/17/2021   ALBUMIN 4.2 10/17/2021   CALCIUM 9.3 10/17/2021   ANIONGAP 11 01/20/2020   EGFR 65 10/17/2021   Lab Results  Component Value Date   CHOL 150 10/17/2021   Lab Results  Component Value Date   HDL 56 10/17/2021   Lab Results  Component Value Date   LDLCALC 79 10/17/2021   Lab Results  Component Value Date   TRIG 81 10/17/2021   Lab Results  Component Value Date   CHOLHDL 2.7 10/17/2021   Lab Results  Component Value Date   HGBA1C 5.9 (H) 10/17/2021       Assessment & Plan:   Problem List Items Addressed This Visit       Cardiovascular and Mediastinum   Hypertension - Primary    Dash diet reviewed Encouraged to limit her sodium intake to less than 1500 mg daily Encourage to bring ambulatory BP readings at her next appointment Encouraged to take her BP at the same time daily F/u in 3 weeks        Other   Need for immunization against influenza    Patient educated on CDC recommendation for the vaccine. Verbal consent was obtained from the patient, vaccine administered by nurse, no sign of adverse reactions noted at this time. Patient education on arm soreness and use of tylenol or ibuprofen for this patient  was discussed. Patient educated on the signs and symptoms of adverse effect and advise to contact the office if they occur.       Other Visit Diagnoses     Flu vaccine need       Relevant Orders   Flu Vaccine QUAD 6+ mos PF IM (Fluarix Quad PF) (Completed)       No orders of the defined types were placed in this encounter.   Follow-up: Return in about 3 weeks (around 03/20/2022).    Alvira Monday, FNP

## 2022-02-28 DIAGNOSIS — Z23 Encounter for immunization: Secondary | ICD-10-CM | POA: Insufficient documentation

## 2022-02-28 NOTE — Assessment & Plan Note (Signed)
Patient educated on CDC recommendation for the vaccine. Verbal consent was obtained from the patient, vaccine administered by nurse, no sign of adverse reactions noted at this time. Patient education on arm soreness and use of tylenol or ibuprofen for this patient  was discussed. Patient educated on the signs and symptoms of adverse effect and advise to contact the office if they occur.  

## 2022-02-28 NOTE — Assessment & Plan Note (Addendum)
Dash diet reviewed Encouraged to limit her sodium intake to less than 1500 mg daily Encourage to bring ambulatory BP readings at her next appointment Encouraged to take her BP at the same time daily F/u in 3 weeks

## 2022-03-04 ENCOUNTER — Other Ambulatory Visit: Payer: Self-pay | Admitting: Nurse Practitioner

## 2022-03-13 ENCOUNTER — Telehealth: Payer: Self-pay | Admitting: Family Medicine

## 2022-03-13 NOTE — Telephone Encounter (Signed)
Patient LVM , Needs refill on   hydrOXYzine (ATARAX) 25 MG tablet   metroNIDAZOLE (FLAGYL) 250 MG tablet [[id number]

## 2022-03-14 ENCOUNTER — Other Ambulatory Visit: Payer: Self-pay

## 2022-03-14 DIAGNOSIS — J301 Allergic rhinitis due to pollen: Secondary | ICD-10-CM

## 2022-03-14 MED ORDER — FLUTICASONE PROPIONATE 50 MCG/ACT NA SUSP: NASAL | 6 refills | Status: DC

## 2022-03-14 MED ORDER — HYDROXYZINE HCL 25 MG PO TABS: 25.0000 mg | ORAL_TABLET | Freq: Every evening | ORAL | 0 refills | Status: DC | PRN

## 2022-03-14 NOTE — Telephone Encounter (Signed)
You can refill methocarbamol 500 mg every 6 hours as needed for her chronic back pain.  Give her 30 tablets with 2 refills

## 2022-03-14 NOTE — Telephone Encounter (Signed)
Spoke with pt, sent hydroxizine and nasal spray, I asked her about metronidazole why she is needing this she stated she is wanting a refill for muscle spasm, I didn't see any other medication that is '500mg'$  for muscle spasm on her chart? Please advice?

## 2022-03-14 NOTE — Telephone Encounter (Signed)
Pt has been informed of this.

## 2022-03-14 NOTE — Telephone Encounter (Signed)
Please inform the patient that metronidazole is not indicated for muscle spasm it is an antibiotic that is often used to treat bacterial infection.

## 2022-03-15 ENCOUNTER — Other Ambulatory Visit: Payer: Self-pay

## 2022-03-15 MED ORDER — METHOCARBAMOL 500 MG PO TABS: ORAL_TABLET | ORAL | 2 refills | Status: DC

## 2022-03-19 ENCOUNTER — Ambulatory Visit: Payer: 59 | Admitting: Family Medicine

## 2022-03-19 ENCOUNTER — Ambulatory Visit: Payer: 59 | Admitting: Nurse Practitioner

## 2022-03-22 ENCOUNTER — Encounter: Payer: Self-pay | Admitting: Family Medicine

## 2022-03-22 ENCOUNTER — Ambulatory Visit (INDEPENDENT_AMBULATORY_CARE_PROVIDER_SITE_OTHER): Payer: 59 | Admitting: Family Medicine

## 2022-03-22 VITALS — BP 126/86 | HR 97 | Ht 62.5 in | Wt 178.0 lb

## 2022-03-22 DIAGNOSIS — G8929 Other chronic pain: Secondary | ICD-10-CM | POA: Diagnosis not present

## 2022-03-22 DIAGNOSIS — I10 Essential (primary) hypertension: Secondary | ICD-10-CM | POA: Diagnosis not present

## 2022-03-22 DIAGNOSIS — G4700 Insomnia, unspecified: Secondary | ICD-10-CM

## 2022-03-22 DIAGNOSIS — M25511 Pain in right shoulder: Secondary | ICD-10-CM

## 2022-03-22 MED ORDER — MELOXICAM 10 MG PO CAPS: 1.0000 | ORAL_CAPSULE | Freq: Every day | ORAL | 1 refills | Status: DC

## 2022-03-22 MED ORDER — TRAZODONE HCL 50 MG PO TABS: 25.0000 mg | ORAL_TABLET | Freq: Every evening | ORAL | 3 refills | Status: DC | PRN

## 2022-03-22 NOTE — Assessment & Plan Note (Signed)
Chronic Will increase meloxicam to 10 mg daily Encourage patient to follow-up with orthopedics for steroid injections

## 2022-03-22 NOTE — Assessment & Plan Note (Addendum)
Controlled Informed patient of discrepancy with her blood pressure machine  Encourage patient to get a new blood pressure machine No changes made to treatment regimen today Continue amlodipine 5 mg daily as prescribed BP Readings from Last 3 Encounters:  03/22/22 126/86  02/27/22 133/82  12/21/21 121/81

## 2022-03-22 NOTE — Progress Notes (Signed)
Established Patient Office Visit  Subjective:  Patient ID: Jacqueline [name], female    DOB: 1960/09/27  Age: 61 y.o. MRN: [id number]  CC:  Chief Complaint  Patient presents with   Follow-up    3 week f/u for bp check, readings have varied in between 120/70's and 140/70's. Pt would like to follow up about sleep study results and if cpap machine is needed for her to have. Pt reports shoulder pain, since last visit.     HPI Jacqueline [name] is a 61 y.o. female with past medical history of hypertension, GERD, nausea and vomiting, and hyperlipidemia presents for f/u of  chronic medical conditions.  Hypertension: Controlled.She takes amlodipine 5 mg daily.  She reports elevated ambulatory blood pressure readings.  She denies headaches, blurred vision, chest pain, palpitations and lower extremity edema.  Inform patient that her sleep study results showed mild sleep apnea.  Insomnia: She takes Vistaril 25 mg for sleep, noting minimal relief of her symptoms with the medication.  She reports difficulty falling asleep noting that Vistaril 25 mg only makes her drowsy and not sleepy.  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  She reports that the steroid injections only lasts a few weeks.  She takes meloxicam 7.5 mg daily and reports minimal relief of symptoms with the medication.  She complains of morning stiffness and pain.   Past Medical History:  Diagnosis Date   Anemia    Arthritis    per patient, back   Complication of anesthesia 2018   per patient HR dropped during partial hysterectomy surgery   Fibroid uterus    GERD (gastroesophageal reflux disease)    H/O total hysterectomy    Hyperlipidemia    Borderline   Hypertension    Numbness and tingling in right hand    Pre-diabetes    borderline    Past Surgical History:  Procedure Laterality Date   BACK SURGERY     BALLOON DILATION N/A 05/22/2021    Procedure: BALLOON DILATION;  Surgeon: Eloise Harman, DO;  [facility];  Service: Endoscopy;  Laterality: N/A;   CHOLECYSTECTOMY     COLONOSCOPY  2003   Dr. Gala Romney: Anal canal hemorrhoids   COLONOSCOPY N/A 08/25/2014   Normal colonoscopy.  Next colonoscopy in 2026.  Procedure: COLONOSCOPY;  Surgeon: Daneil Dolin, MD;  [facility];  Service: Endoscopy;  Laterality: N/A;  1200   ESOPHAGOGASTRODUODENOSCOPY N/A 08/25/2014   Procedure: ESOPHAGOGASTRODUODENOSCOPY (EGD);  Surgeon: Daneil Dolin, MD;  [facility];  Service: Endoscopy;  Laterality: N/A;   ESOPHAGOGASTRODUODENOSCOPY (EGD) WITH PROPOFOL N/A 05/22/2021   Web and distal esophagus, dilated, gastriti, biopsy positive for mildly active H. pylori gastritis.  s procedure: ESOPHAGOGASTRODUODENOSCOPY (EGD) WITH PROPOFOL;  Surgeon: Eloise Harman, DO;  [facility];  Service: Endoscopy;  Laterality: N/A;  9:15am   SALPINGOOPHORECTOMY Bilateral 11/06/2016   Procedure: BILATERAL SALPINGO OOPHORECTOMY;  Surgeon: Florian Buff, MD;  Location[facility];  Service: Gynecology;  Laterality: Bilateral;   SUPRACERVICAL ABDOMINAL HYSTERECTOMY N/A 11/06/2016   Procedure: HYSTERECTOMY SUPRACERVICAL ABDOMINAL;  Surgeon: Florian Buff, MD;  Location: [facility];  Service: Gynecology;  Laterality: N/A;   TRANSFORAMINAL LUMBAR INTERBODY FUSION (TLIF) WITH PEDICLE SCREW FIXATION 2 LEVEL Left 07/28/2019   Procedure: LEFT-SIDED LUMBAR 4- 5, LUMBAR 5 -SACRUM1 TRANSFORAMINAL LUMBAR INTERBODY FUSION WITH INSTRUMENTATION AND ALLOGRAFT;  Surgeon: Phylliss Bob, MD;  Location: [place];  Service: Orthopedics;  Laterality: Left;   TUBAL LIGATION      Family History  Problem Relation Age of Onset   Breast cancer Maternal Aunt    Hypertension Maternal Aunt    Cancer Maternal Aunt        Breast   Cancer Maternal Grandmother         lymphnodes   Colon cancer Neg Hx     Social History   Socioeconomic History   Marital  status: Single    Spouse name: Not on file   Number of children: 3   Years of education: Not on file   Highest education level: Not on file  Occupational History   Occupation: PT at [employer]  Tobacco Use   Smoking status: Never   Smokeless tobacco: Never   Tobacco comments:    Never smoked  Vaping Use   Vaping Use: Never used  Substance and Sexual Activity   Alcohol use: No    Alcohol/week: 0.0 standard drinks of alcohol   Drug use: No   Sexual activity: Yes    Birth control/protection: Post-menopausal, Surgical  Other Topics Concern   Not on file  Social History Narrative   Lives with her spouse, work part at [employer]    Social Determinants of Health   Financial Resource Strain: Not on file  Food Insecurity: Not on file  Transportation Needs: Not on file  Physical Activity: Not on file  Stress: Not on file  Social Connections: Not on file  Intimate Partner Violence: Not on file    Outpatient Medications Prior to Visit  Medication Sig Dispense Refill   acyclovir (ZOVIRAX) 400 MG tablet TAKE 1 TABLET (400 MG TOTAL) BY MOUTH DAILY AS NEEDED (OUTBREAKS). 30 tablet 1   amLODipine (NORVASC) 5 MG tablet Take 1 tablet by mouth once daily 90 tablet 0   atorvastatin (LIPITOR) 20 MG tablet Take 1 tablet (20 mg total) by mouth daily. 90 tablet 0   estradiol (ESTRACE) 0.1 MG/GM vaginal cream INSERT 2 GRAMS VAGINALLY AS DIRECTED EVERY OTHER NIGHT 43 g 11   fluticasone (FLONASE) 50 MCG/ACT nasal spray Use 2 sprays in each nostril BID for a week. After 1 week, decrease to 1 spray in each nostril BID as needed for congestion/allergies. 16 g 6   methocarbamol (ROBAXIN) 500 MG tablet TAKE 1 TABLET BY MOUTH EVERY 6 HOURS AS NEEDED FOR CHRONIC BACK PAIN 30 tablet 2   Multiple Vitamin (MULTIVITAMIN WITH MINERALS) TABS tablet Take 1 tablet by mouth daily with supper. One-A-Day Multivitamin     pantoprazole (PROTONIX) 40 MG tablet Take 1 tablet (40 mg total) by mouth 2 (two) times daily  before a meal. 180 tablet 0   polyethylene glycol powder (MIRALAX) 17 GM/SCOOP powder Take one capful (17 grams) once daily as needed to maintain soft stool 527 g 11   triamcinolone cream (KENALOG) 0.1 % Apply 1 application. topically 2 (two) times daily. 30 g 0   hydrOXYzine (ATARAX) 25 MG tablet Take 1 tablet (25 mg total) by mouth at bedtime as needed. 30 tablet 0   meloxicam (MOBIC) 7.5 MG tablet Take 1 tablet (7.5 mg total) by mouth daily. 90 tablet 0   No facility-administered medications prior to visit.    No Known Allergies  ROS Review of Systems  Constitutional:  Negative for fatigue and fever.  HENT:  Negative for sinus pressure, sneezing and sore throat.   Eyes:  Negative for photophobia, redness and visual disturbance.  Respiratory:  Negative for chest tightness and shortness of breath.   Gastrointestinal:  Negative for constipation, nausea and vomiting.  Endocrine: Negative for polyphagia and polyuria.  Genitourinary:  Negative for decreased urine volume, vaginal bleeding and vaginal pain.  Musculoskeletal:  Negative for back pain and gait problem.      Objective:    Physical Exam HENT:     Head: Normocephalic.  Cardiovascular:     Rate and Rhythm: Normal rate and regular rhythm.     Pulses: Normal pulses.     Heart sounds: Normal heart sounds.  Pulmonary:     Effort: Pulmonary effort is normal.     Breath sounds: Normal breath sounds.  Neurological:     Mental Status: She is alert.     BP 126/86   Pulse 97   Ht 5' 2.5" (1.588 m)   Wt 178 lb (80.7 kg)   LMP 10/26/2016 (Exact Date)   SpO2 94%   BMI 32.04 kg/m  Wt Readings from Last 3 Encounters:  03/22/22 178 lb (80.7 kg)  02/27/22 180 lb 1.3 oz (81.7 kg)  12/21/21 178 lb (80.7 kg)    Lab Results  Component Value Date   TSH 0.686 10/17/2021   Lab Results  Component Value Date   WBC 7.4 07/05/2021   HGB 13.2 07/05/2021   HCT 39.5 07/05/2021   MCV 91 07/05/2021   PLT 243 07/05/2021   Lab  Results  Component Value Date   NA 140 10/17/2021   K 3.9 10/17/2021   CO2 25 10/17/2021   GLUCOSE 86 10/17/2021   BUN 10 10/17/2021   CREATININE 1.00 10/17/2021   BILITOT 0.8 10/17/2021   ALKPHOS 96 10/17/2021   AST 23 10/17/2021   ALT 15 10/17/2021   PROT 7.2 10/17/2021   ALBUMIN 4.2 10/17/2021   CALCIUM 9.3 10/17/2021   ANIONGAP 11 01/20/2020   EGFR 65 10/17/2021   Lab Results  Component Value Date   CHOL 150 10/17/2021   Lab Results  Component Value Date   HDL 56 10/17/2021   Lab Results  Component Value Date   LDLCALC 79 10/17/2021   Lab Results  Component Value Date   TRIG 81 10/17/2021   Lab Results  Component Value Date   CHOLHDL 2.7 10/17/2021   Lab Results  Component Value Date   HGBA1C 5.9 (H) 10/17/2021      Assessment & Plan:   Problem List Items Addressed This Visit       Cardiovascular and Mediastinum   Hypertension - Primary    Controlled Informed patient of discrepancy with her blood pressure machine  Encourage patient to get a new blood pressure machine No changes made to treatment regimen today Continue amlodipine 5 mg daily as prescribed BP Readings from Last 3 Encounters:  03/22/22 126/86  02/27/22 133/82  12/21/21 121/81          Other   Right shoulder pain    Chronic Will increase meloxicam to 10 mg daily Encourage patient to follow-up with orthopedics for steroid injections       Relevant Medications   Meloxicam 10 MG CAPS   Insomnia    Will d/c Vistaril 25 mg at bedtime as needed We will start patient on trazodone 25 to 50 mg as needed for sleep      Relevant Medications   traZODone (DESYREL) 50 MG tablet    Meds ordered this encounter  Medications   traZODone (DESYREL) 50 MG tablet    Sig: Take 0.5-1 tablets (  25-50 mg total) by mouth at bedtime as needed for sleep.    Dispense:  30 tablet    Refill:  3   Meloxicam 10 MG CAPS    Sig: Take 1 tablet by mouth daily.    Dispense:  90 capsule    Refill:  1     Follow-up: Return in about 3 months (around 06/25/2022).    Alvira Monday, FNP

## 2022-03-22 NOTE — Assessment & Plan Note (Signed)
Will d/c Vistaril 25 mg at bedtime as needed We will start patient on trazodone 25 to 50 mg as needed for sleep

## 2022-03-22 NOTE — Patient Instructions (Addendum)
I appreciate the opportunity to provide care to you today!    Follow up:  06/25/2022  Please pick up your medication at the pharmacy   Please continue to a heart-healthy diet and increase your physical activities. Try to exercise for 87mns at least three times a week.      It was a pleasure to see you and I look forward to continuing to work together on your health and well-being. Please do not hesitate to call the office if you need care or have questions about your care.   Have a wonderful day and week. With Gratitude, GAlvira MondayMSN, FNP-BC

## 2022-03-26 ENCOUNTER — Telehealth: Payer: Self-pay

## 2022-03-26 ENCOUNTER — Other Ambulatory Visit: Payer: Self-pay | Admitting: Family Medicine

## 2022-03-26 DIAGNOSIS — K219 Gastro-esophageal reflux disease without esophagitis: Secondary | ICD-10-CM

## 2022-03-26 DIAGNOSIS — S46911D Strain of unspecified muscle, fascia and tendon at shoulder and upper arm level, right arm, subsequent encounter: Secondary | ICD-10-CM

## 2022-03-26 MED ORDER — PANTOPRAZOLE SODIUM 40 MG PO TBEC: 40.0000 mg | DELAYED_RELEASE_TABLET | Freq: Two times a day (BID) | ORAL | 0 refills | Status: DC

## 2022-03-26 MED ORDER — NAPROXEN 500 MG PO TABS: 500.0000 mg | ORAL_TABLET | Freq: Two times a day (BID) | ORAL | 0 refills | Status: DC

## 2022-03-26 NOTE — Telephone Encounter (Signed)
Left a vm for pt

## 2022-03-26 NOTE — Telephone Encounter (Signed)
I will order her naproxen 500 mg, and she can take it 2 times daily as needed for her shoulder pain.  Please advise her to take Protonix 40 mg daily while taking naproxen to decrease to risk for GI bleeding ulcerations and perforation.  Encouraged her to only take naproxen as needed for pain

## 2022-03-31 ENCOUNTER — Other Ambulatory Visit: Payer: Self-pay | Admitting: Family Medicine

## 2022-04-02 ENCOUNTER — Other Ambulatory Visit: Payer: Self-pay | Admitting: Obstetrics & Gynecology

## 2022-04-19 ENCOUNTER — Telehealth: Payer: Self-pay | Admitting: Gastroenterology

## 2022-04-19 NOTE — Telephone Encounter (Signed)
Patient left a message that she wanted another prescription for her stomach medicine.

## 2022-04-19 NOTE — Telephone Encounter (Signed)
Pt called wanting another round of pylera stating that she was having horrible gas and abdominal pain like she was when she was diagnosed with h pylori. Informed patient that she would need to be retested for h pylori to see if it was eradicated with the last round and that she is on recall for a follow up in Jan. Explained to pt that if she was having issues and felt like she needed to be seen sooner that she could get an appt to discuss her symptoms further. Pt verbalized understanding.

## 2022-04-19 NOTE — Telephone Encounter (Signed)
Patient last seen in 11/2021. I would offer her first available appt with [facility] or [facility].

## 2022-04-22 NOTE — Progress Notes (Deleted)
GI Office Note    Referring Provider: Alvira Monday, [place] Primary Care Physician:  Alvira Monday, [place]  Primary Gastroenterologist: Garfield Cornea, MD   Chief Complaint   No chief complaint on file.   History of Present Illness   Jacqueline [name] is a 61 y.o. female presenting today    Retreated for H.pylori 12/2021 with Pepto Bismol, tetracycline, metronidazole, pantoprazole.   EGD 05/2021: - Web in the distal esophagus. Dilated. - Gastritis. Biopsied.  Mildly active H. pylori gastritis.   - Normal duodenal bulb, first portion of the duodenum and second portion of the duodenum.      Medications   Current Outpatient Medications  Medication Sig Dispense Refill   acyclovir (ZOVIRAX) 400 MG tablet TAKE 1 TABLET (400 MG TOTAL) BY MOUTH DAILY AS NEEDED (OUTBREAKS) 90 tablet 1   amLODipine (NORVASC) 5 MG tablet Take 1 tablet by mouth once daily 90 tablet 0   atorvastatin (LIPITOR) 20 MG tablet Take 1 tablet (20 mg total) by mouth daily. 90 tablet 0   estradiol (ESTRACE) 0.1 MG/GM vaginal cream INSERT 2 GRAMS VAGINALLY AS DIRECTED EVERY OTHER NIGHT 43 g 11   fluticasone (FLONASE) 50 MCG/ACT nasal spray Use 2 sprays in each nostril BID for a week. After 1 week, decrease to 1 spray in each nostril BID as needed for congestion/allergies. 16 g 6   methocarbamol (ROBAXIN) 500 MG tablet TAKE 1 TABLET BY MOUTH EVERY 6 HOURS AS NEEDED FOR CHRONIC BACK PAIN 30 tablet 2   Multiple Vitamin (MULTIVITAMIN WITH MINERALS) TABS tablet Take 1 tablet by mouth daily with supper. One-A-Day Multivitamin     naproxen (NAPROSYN) 500 MG tablet Take 1 tablet (500 mg total) by mouth 2 (two) times daily with a meal. 20 tablet 0   pantoprazole (PROTONIX) 40 MG tablet Take 1 tablet (40 mg total) by mouth 2 (two) times daily before a meal. 180 tablet 0   polyethylene glycol powder (MIRALAX) 17 GM/SCOOP powder Take one capful (17 grams) once daily as needed to maintain soft stool 527 g 11   traZODone  (DESYREL) 50 MG tablet Take 0.5-1 tablets (25-50 mg total) by mouth at bedtime as needed for sleep. 30 tablet 3   triamcinolone cream (KENALOG) 0.1 % Apply 1 application. topically 2 (two) times daily. 30 g 0   No current facility-administered medications for this visit.    Allergies   Allergies as of 04/23/2022   (No Known Allergies)     Past Medical History   Past Medical History:  Diagnosis Date   Anemia    Arthritis    per patient, back   Complication of anesthesia 2018   per patient HR dropped during partial hysterectomy surgery   Fibroid uterus    GERD (gastroesophageal reflux disease)    H/O total hysterectomy    Hyperlipidemia    Borderline   Hypertension    Numbness and tingling in right hand    Pre-diabetes    borderline    Past Surgical History   Past Surgical History:  Procedure Laterality Date   BACK SURGERY     BALLOON DILATION N/A 05/22/2021   Procedure: BALLOON DILATION;  Surgeon: Eloise Harman, DO;  [facility];  Service: Endoscopy;  Laterality: N/A;   CHOLECYSTECTOMY     COLONOSCOPY  2003   Dr. Gala Romney: Anal canal hemorrhoids   COLONOSCOPY N/A 08/25/2014   Normal colonoscopy.  Next colonoscopy in 2026.  Procedure: COLONOSCOPY;  Surgeon: Daneil Dolin,  MD;  [facility];  Service: Endoscopy;  Laterality: N/A;  1200   ESOPHAGOGASTRODUODENOSCOPY N/A 08/25/2014   Procedure: ESOPHAGOGASTRODUODENOSCOPY (EGD);  Surgeon: Daneil Dolin, MD;  [facility];  Service: Endoscopy;  Laterality: N/A;   ESOPHAGOGASTRODUODENOSCOPY (EGD) WITH PROPOFOL N/A 05/22/2021   Web and distal esophagus, dilated, gastriti, biopsy positive for mildly active H. pylori gastritis.  s procedure: ESOPHAGOGASTRODUODENOSCOPY (EGD) WITH PROPOFOL;  Surgeon: Eloise Harman, DO;  [facility];  Service: Endoscopy;  Laterality: N/A;  9:15am   SALPINGOOPHORECTOMY Bilateral 11/06/2016   Procedure: BILATERAL SALPINGO OOPHORECTOMY;  Surgeon:  Florian Buff, MD;  [facility];  Service: Gynecology;  Laterality: Bilateral;   SUPRACERVICAL ABDOMINAL HYSTERECTOMY N/A 11/06/2016   Procedure: HYSTERECTOMY SUPRACERVICAL ABDOMINAL;  Surgeon: Florian Buff, MD;  [facility];  Service: Gynecology;  Laterality: N/A;   TRANSFORAMINAL LUMBAR INTERBODY FUSION (TLIF) WITH PEDICLE SCREW FIXATION 2 LEVEL Left 07/28/2019   Procedure: LEFT-SIDED LUMBAR 4- 5, LUMBAR 5 -SACRUM1 TRANSFORAMINAL LUMBAR INTERBODY FUSION WITH INSTRUMENTATION AND ALLOGRAFT;  Surgeon: Phylliss Bob, MD;  Location: [facility];  Service: Orthopedics;  Laterality: Left;   TUBAL LIGATION      Past Family History   Family History  Problem Relation Age of Onset   Breast cancer Maternal Aunt    Hypertension Maternal Aunt    Cancer Maternal Aunt        Breast   Cancer Maternal Grandmother         lymphnodes   Colon cancer Neg Hx     Past Social History   Social History   Socioeconomic History   Marital status: Single    Spouse name: Not on file   Number of children: 3   Years of education: Not on file   Highest education level: Not on file  Occupational History   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   Smoking status: Never   Smokeless tobacco: Never   Tobacco comments:    Never smoked  Vaping Use   Vaping Use: Never used  Substance and Sexual Activity   Alcohol use: No    Alcohol/week: 0.0 standard drinks of alcohol   Drug use: No   Sexual activity: Yes    Birth control/protection: Post-menopausal, Surgical  Other Topics Concern   Not on file  Social History Narrative   Lives with her spouse, work part at [employer]    Social Determinants of Health   Financial Resource Strain: Not on file  Food Insecurity: Not on file  Transportation Needs: Not on file  Physical Activity: Not on file  Stress: Not on file  Social Connections: Not on file  Intimate Partner Violence: Not on file    Review of Systems   General: Negative for anorexia,  weight loss, fever, chills, fatigue, weakness. ENT: Negative for hoarseness, difficulty swallowing , nasal congestion. CV: Negative for chest pain, angina, palpitations, dyspnea on exertion, peripheral edema.  Respiratory: Negative for dyspnea at rest, dyspnea on exertion, cough, sputum, wheezing.  GI: See history of present illness. GU:  Negative for dysuria, hematuria, urinary incontinence, urinary frequency, nocturnal urination.  Endo: Negative for unusual weight change.     Physical Exam   LMP 10/26/2016 (Exact Date)    General: Well-nourished, well-developed in no acute distress.  Eyes: No icterus. Mouth: Oropharyngeal mucosa moist and pink , no lesions erythema or exudate. Lungs: Clear to auscultation bilaterally.  Heart: Regular rate and rhythm, no murmurs rubs or gallops.  Abdomen: Bowel sounds are normal, nontender, nondistended, no hepatosplenomegaly or masses,  no abdominal bruits or hernia , no rebound or guarding.  Rectal: ***  Extremities: No lower extremity edema. No clubbing or deformities. Neuro: Alert and oriented x 4   Skin: Warm and dry, no jaundice.   Psych: Alert and cooperative, normal mood and affect.  Labs   *** Imaging Studies   No results found.  Assessment       PLAN   ***   Laureen Ochs. Bobby Rumpf, [place], [facility]

## 2022-04-23 ENCOUNTER — Ambulatory Visit: Payer: 59 | Admitting: Gastroenterology

## 2022-04-24 ENCOUNTER — Ambulatory Visit: Payer: 59 | Admitting: Gastroenterology

## 2022-04-29 ENCOUNTER — Ambulatory Visit: Payer: 59 | Admitting: Gastroenterology

## 2022-05-08 ENCOUNTER — Other Ambulatory Visit: Payer: Self-pay | Admitting: Nurse Practitioner

## 2022-05-09 DIAGNOSIS — G4733 Obstructive sleep apnea (adult) (pediatric): Secondary | ICD-10-CM | POA: Diagnosis not present

## 2022-05-13 ENCOUNTER — Other Ambulatory Visit: Payer: Self-pay | Admitting: Nurse Practitioner

## 2022-05-13 DIAGNOSIS — I10 Essential (primary) hypertension: Secondary | ICD-10-CM

## 2022-05-14 ENCOUNTER — Encounter: Payer: Self-pay | Admitting: Family Medicine

## 2022-05-14 ENCOUNTER — Ambulatory Visit (INDEPENDENT_AMBULATORY_CARE_PROVIDER_SITE_OTHER): Payer: 59 | Admitting: Family Medicine

## 2022-05-14 DIAGNOSIS — G8929 Other chronic pain: Secondary | ICD-10-CM

## 2022-05-14 DIAGNOSIS — M25511 Pain in right shoulder: Secondary | ICD-10-CM | POA: Diagnosis not present

## 2022-05-14 DIAGNOSIS — M542 Cervicalgia: Secondary | ICD-10-CM | POA: Diagnosis not present

## 2022-05-14 DIAGNOSIS — M25512 Pain in left shoulder: Secondary | ICD-10-CM

## 2022-05-14 MED ORDER — DICLOFENAC SODIUM 1 % EX GEL: 4.0000 g | Freq: Four times a day (QID) | CUTANEOUS | 0 refills | Status: DC

## 2022-05-14 NOTE — Progress Notes (Unsigned)
Virtual Visit via Telephone Note   This visit type was conducted via telephone. This format is felt to be most appropriate for this patient at this time.  The patient did not have access to video technology/had technical difficulties with video requiring transitioning to audio format only (telephone).  All issues noted in this document were discussed and addressed.  No physical exam could be performed with this format.  Evaluation Performed:  Follow-up visit  Date:  05/15/2022   ID:  Jacqueline [name], DOB Jan 10, 1961, MRN [id number]  Patient Location: Home Provider Location: Clinic  Participants: Patient Location of Patient: Home Location of Provider: Clinic Consent was obtain for visit to be over via telehealth. I verified that I am speaking with the correct person using two identifiers.  PCP:  Alvira Monday, FNP   Chief Complaint: Bilateral shoulder pain  History of Present Illness:    Jacqueline [name] is a 61 y.o. female with complaints of bilateral shoulder pain.  She describes the pain as sharp and reports still having a left-sided pain in her neck that radiates down to her arm.  She has a history of chronic right shoulder pain for which she receives steroid injections.  She did follow up with Dr.Keeling on 01/04/2021.  She reports that was the last time  saw the provider.  She complains of pain when lying on the affected side, more so the right shoulder than the left.  She has attempted conservative management with no to minimal relief of her symptoms.  She complains of pain with overhead movements and denies trauma, swelling, and numbness at the affected site.      The patient does not have symptoms concerning for COVID-19 infection (fever, chills, cough, or new shortness of breath).   Past Medical, Surgical, Social History, Allergies, and Medications have been Reviewed.  Past Medical History:  Diagnosis Date   Anemia    Arthritis    per patient, back   Complication of  anesthesia 2018   per patient HR dropped during partial hysterectomy surgery   Fibroid uterus    GERD (gastroesophageal reflux disease)    H/O total hysterectomy    Hyperlipidemia    Borderline   Hypertension    Numbness and tingling in right hand    Pre-diabetes    borderline   Past Surgical History:  Procedure Laterality Date   BACK SURGERY     BALLOON DILATION N/A 05/22/2021   Procedure: BALLOON DILATION;  Surgeon: Eloise Harman, DO;  [facility];  Service: Endoscopy;  Laterality: N/A;   CHOLECYSTECTOMY     COLONOSCOPY  2003   Dr. Gala Romney: Anal canal hemorrhoids   COLONOSCOPY N/A 08/25/2014   Normal colonoscopy.  Next colonoscopy in 2026.  Procedure: COLONOSCOPY;  Surgeon: Daneil Dolin, MD;  [facility];  Service: Endoscopy;  Laterality: N/A;  1200   ESOPHAGOGASTRODUODENOSCOPY N/A 08/25/2014   Procedure: ESOPHAGOGASTRODUODENOSCOPY (EGD);  Surgeon: Daneil Dolin, MD;  [facility];  Service: Endoscopy;  Laterality: N/A;   ESOPHAGOGASTRODUODENOSCOPY (EGD) WITH PROPOFOL N/A 05/22/2021   Web and distal esophagus, dilated, gastriti, biopsy positive for mildly active H. pylori gastritis.  s procedure: ESOPHAGOGASTRODUODENOSCOPY (EGD) WITH PROPOFOL;  Surgeon: Eloise Harman, DO;  [facility];  Service: Endoscopy;  Laterality: N/A;  9:15am   SALPINGOOPHORECTOMY Bilateral 11/06/2016   Procedure: BILATERAL SALPINGO OOPHORECTOMY;  Surgeon: Florian Buff, MD;  [facility];  Service: Gynecology;  Laterality: Bilateral;  SUPRACERVICAL ABDOMINAL HYSTERECTOMY N/A 11/06/2016   Procedure: HYSTERECTOMY SUPRACERVICAL ABDOMINAL;  Surgeon: Florian Buff, MD;  [facility];  Service: Gynecology;  Laterality: N/A;   TRANSFORAMINAL LUMBAR INTERBODY FUSION (TLIF) WITH PEDICLE SCREW FIXATION 2 LEVEL Left 07/28/2019   Procedure: LEFT-SIDED LUMBAR 4- 5, LUMBAR 5 -SACRUM1 TRANSFORAMINAL LUMBAR INTERBODY FUSION WITH INSTRUMENTATION AND ALLOGRAFT;   Surgeon: Phylliss Bob, MD;  Location: [place];  Service: Orthopedics;  Laterality: Left;   TUBAL LIGATION       Current Meds  Medication Sig   acyclovir (ZOVIRAX) 400 MG tablet TAKE 1 TABLET (400 MG TOTAL) BY MOUTH DAILY AS NEEDED (OUTBREAKS)   amLODipine (NORVASC) 5 MG tablet Take 1 tablet by mouth once daily   atorvastatin (LIPITOR) 20 MG tablet Take 1 tablet by mouth once daily   diclofenac Sodium (VOLTAREN) 1 % GEL Apply 4 g topically 4 (four) times daily.   estradiol (ESTRACE) 0.1 MG/GM vaginal cream INSERT 2 GRAMS VAGINALLY AS DIRECTED EVERY OTHER NIGHT   fluticasone (FLONASE) 50 MCG/ACT nasal spray Use 2 sprays in each nostril BID for a week. After 1 week, decrease to 1 spray in each nostril BID as needed for congestion/allergies.   methocarbamol (ROBAXIN) 500 MG tablet TAKE 1 TABLET BY MOUTH EVERY 6 HOURS AS NEEDED FOR CHRONIC BACK PAIN   Multiple Vitamin (MULTIVITAMIN WITH MINERALS) TABS tablet Take 1 tablet by mouth daily with supper. One-A-Day Multivitamin   pantoprazole (PROTONIX) 40 MG tablet Take 1 tablet (40 mg total) by mouth 2 (two) times daily before a meal.   polyethylene glycol powder (MIRALAX) 17 GM/SCOOP powder Take one capful (17 grams) once daily as needed to maintain soft stool   traZODone (DESYREL) 50 MG tablet Take 0.5-1 tablets (25-50 mg total) by mouth at bedtime as needed for sleep.   triamcinolone cream (KENALOG) 0.1 % Apply 1 application. topically 2 (two) times daily.     Allergies:   Patient has no known allergies.   ROS:   Please see the history of present illness.     All other systems reviewed and are negative.   Labs/Other Tests and Data Reviewed:    Recent Labs: 07/05/2021: Hemoglobin 13.2; Platelets 243 10/17/2021: ALT 15; BUN 10; Creatinine, Ser 1.00; Potassium 3.9; Sodium 140; TSH 0.686   Recent Lipid Panel Lab Results  Component Value Date/Time   CHOL 150 10/17/2021 11:12 AM   TRIG 81 10/17/2021 11:12 AM   HDL 56 10/17/2021 11:12 AM    CHOLHDL 2.7 10/17/2021 11:12 AM   CHOLHDL 3.0 01/11/2020 03:28 PM   LDLCALC 79 10/17/2021 11:12 AM   LDLCALC 76 01/11/2020 03:28 PM    Wt Readings from Last 3 Encounters:  03/22/22 178 lb (80.7 kg)  02/27/22 180 lb 1.3 oz (81.7 kg)  12/21/21 178 lb (80.7 kg)     Objective:    Vital Signs:  LMP 10/26/2016 (Exact Date)      ASSESSMENT & PLAN:   Bilateral shoulder pain Referral placed to orthopedic surgery for possible steroid injections Encourage conservative management with Tylenol Encouraged to continue taking Robaxin 500 mg every 6 hours for neck pain Encouraged to use topical with Taryn gel at the affected sites   Today, I have spent 15 minutes reviewing the chart, including problem list, medications, and with the patient with telehealth technology discussing the above problems.   Medication Adjustments/Labs and Tests Ordered: Current medicines are reviewed at length with the patient today.  Concerns regarding medicines are outlined above.   Tests Ordered:  Orders Placed This Encounter  Procedures   Ambulatory referral to Orthopedic Surgery    Medication Changes: Meds ordered this encounter  Medications   diclofenac Sodium (VOLTAREN) 1 % GEL    Sig: Apply 4 g topically 4 (four) times daily.    Dispense:  4 g    Refill:  0     Note: This dictation was prepared with Dragon dictation along with smaller phrase technology. Similar sounding words can be transcribed inadequately or may not be corrected upon review. Any transcriptional errors that result from this process are unintentional.      Disposition:  Follow up  Signed, Alvira Monday, FNP  05/15/2022 7:42 AM     [place]

## 2022-05-14 NOTE — Telephone Encounter (Signed)
Patient called and wanted to make an appointment for gas and choking when eating. She wanted a Friday appt or after 2pm appt if possible with any provider she said.   [phone]

## 2022-05-16 ENCOUNTER — Encounter: Payer: 59 | Admitting: Orthopaedic Surgery

## 2022-05-17 ENCOUNTER — Telehealth: Payer: Self-pay | Admitting: Family Medicine

## 2022-05-17 ENCOUNTER — Encounter: Payer: Self-pay | Admitting: Internal Medicine

## 2022-05-17 ENCOUNTER — Ambulatory Visit (INDEPENDENT_AMBULATORY_CARE_PROVIDER_SITE_OTHER): Payer: 59 | Admitting: Internal Medicine

## 2022-05-17 DIAGNOSIS — Z20822 Contact with and (suspected) exposure to covid-19: Secondary | ICD-10-CM | POA: Diagnosis not present

## 2022-05-17 DIAGNOSIS — J0110 Acute frontal sinusitis, unspecified: Secondary | ICD-10-CM | POA: Diagnosis not present

## 2022-05-17 MED ORDER — NOREL AD 4-10-325 MG PO TABS: 1.0000 | ORAL_TABLET | Freq: Three times a day (TID) | ORAL | 0 refills | Status: DC | PRN

## 2022-05-17 MED ORDER — AZELASTINE HCL 0.1 % NA SOLN: 1.0000 | Freq: Two times a day (BID) | NASAL | 0 refills | Status: DC

## 2022-05-17 NOTE — Telephone Encounter (Signed)
Pt called stating she done a home covid test & it was negative. States she has been blowing her nose a lot and blood is coming out. Wants to know what to do? Had tele with Posey Pronto this am.

## 2022-05-17 NOTE — Progress Notes (Signed)
Virtual Visit via Telephone Note   This visit type was conducted via telephone. This format is felt to be most appropriate for this patient at this time.  The patient did not have access to video technology/had technical difficulties with video requiring transitioning to audio format only (telephone).  All issues noted in this document were discussed and addressed.  No physical exam could be performed with this format.  Evaluation Performed:  Follow-up visit  Date:  05/17/2022   ID:  Jacqueline [name], DOB [birth year], MRN [id number]  Patient Location: Home Provider Location: Office/Clinic  Participants: Patient Location of Patient: Home Location of Provider: Telehealth Consent was obtain for visit to be over via telehealth. I verified that I am speaking with the correct person using two identifiers.  PCP:  Alvira Monday, FNP   Chief Complaint: Nasal congestion and myalgias  History of Present Illness:    Jacqueline [name] is a 61 y.o. female who has a televisit for c/o nasal congestion, sinus pressure and myalgias for the last 3 days.  She denies any fever or chills.  Denies any dyspnea, chest pain or palpitations.  She recently started CPAP device and feels that her symptoms are due to it.  The patient does not have symptoms concerning for COVID-19 infection (fever, chills, cough, or new shortness of breath).   Past Medical, Surgical, Social History, Allergies, and Medications have been Reviewed.  Past Medical History:  Diagnosis Date   Anemia    Arthritis    per patient, back   Complication of anesthesia 2018   per patient HR dropped during partial hysterectomy surgery   Fibroid uterus    GERD (gastroesophageal reflux disease)    H/O total hysterectomy    Hyperlipidemia    Borderline   Hypertension    Numbness and tingling in right hand    Pre-diabetes    borderline   Past Surgical History:  Procedure Laterality Date   BACK SURGERY     BALLOON DILATION N/A  05/22/2021   Procedure: BALLOON DILATION;  Surgeon: Eloise Harman, DO;  [facility];  Service: Endoscopy;  Laterality: N/A;   CHOLECYSTECTOMY     COLONOSCOPY  2003   Dr. Gala Romney: Anal canal hemorrhoids   COLONOSCOPY N/A 08/25/2014   Normal colonoscopy.  Next colonoscopy in 2026.  Procedure: COLONOSCOPY;  Surgeon: Daneil Dolin, MD;  [facility];  Service: Endoscopy;  Laterality: N/A;  1200   ESOPHAGOGASTRODUODENOSCOPY N/A 08/25/2014   Procedure: ESOPHAGOGASTRODUODENOSCOPY (EGD);  Surgeon: Daneil Dolin, MD;  [facility];  Service: Endoscopy;  Laterality: N/A;   ESOPHAGOGASTRODUODENOSCOPY (EGD) WITH PROPOFOL N/A 05/22/2021   Web and distal esophagus, dilated, gastriti, biopsy positive for mildly active H. pylori gastritis.  s procedure: ESOPHAGOGASTRODUODENOSCOPY (EGD) WITH PROPOFOL;  Surgeon: Eloise Harman, DO;  [facility];  Service: Endoscopy;  Laterality: N/A;  9:15am   SALPINGOOPHORECTOMY Bilateral 11/06/2016   Procedure: BILATERAL SALPINGO OOPHORECTOMY;  Surgeon: Florian Buff, MD;  [facility];  Service: Gynecology;  Laterality: Bilateral;   SUPRACERVICAL ABDOMINAL HYSTERECTOMY N/A 11/06/2016   Procedure: HYSTERECTOMY SUPRACERVICAL ABDOMINAL;  Surgeon: Florian Buff, MD;  [facility];  Service: Gynecology;  Laterality: N/A;   TRANSFORAMINAL LUMBAR INTERBODY FUSION (TLIF) WITH PEDICLE SCREW FIXATION 2 LEVEL Left 07/28/2019   Procedure: LEFT-SIDED LUMBAR 4- 5, LUMBAR 5 -SACRUM1 TRANSFORAMINAL LUMBAR INTERBODY FUSION WITH INSTRUMENTATION AND ALLOGRAFT;  Surgeon: Phylliss Bob, MD;  Location: [place];  Service: Orthopedics;  Laterality: Left;   TUBAL LIGATION       Current Meds  Medication Sig   ketoconazole (NIZORAL) 2 % cream Apply topically daily.   [DISCONTINUED] predniSONE (DELTASONE) 5 MG tablet take dosepak over 6 day period as directed     Allergies:   Patient has no known allergies.   ROS:   Please see the  history of present illness.     All other systems reviewed and are negative.   Labs/Other Tests and Data Reviewed:    Recent Labs: 07/05/2021: Hemoglobin 13.2; Platelets 243 10/17/2021: ALT 15; BUN 10; Creatinine, Ser 1.00; Potassium 3.9; Sodium 140; TSH 0.686   Recent Lipid Panel Lab Results  Component Value Date/Time   CHOL 150 10/17/2021 11:12 AM   TRIG 81 10/17/2021 11:12 AM   HDL 56 10/17/2021 11:12 AM   CHOLHDL 2.7 10/17/2021 11:12 AM   CHOLHDL 3.0 01/11/2020 03:28 PM   LDLCALC 79 10/17/2021 11:12 AM   LDLCALC 76 01/11/2020 03:28 PM    Wt Readings from Last 3 Encounters:  03/22/22 178 lb (80.7 kg)  02/27/22 180 lb 1.3 oz (81.7 kg)  12/21/21 178 lb (80.7 kg)     ASSESSMENT & PLAN:    Acute sinusitis Suspected COVID-19 infection Check COVID RT-PCR [place] as needed for nasal congestion Added Astelin nasal spray If persistent symptoms despite negative viral testing, will start antibiotic   Time:   Today, I have spent 9 minutes reviewing the chart, including problem list, medications, and with the patient with telehealth technology discussing the above problems.   Medication Adjustments/Labs and Tests Ordered: Current medicines are reviewed at length with the patient today.  Concerns regarding medicines are outlined above.   Tests Ordered: No orders of the defined types were placed in this encounter.   Medication Changes: No orders of the defined types were placed in this encounter.    Note: This dictation was prepared with Dragon dictation along with smaller phrase technology. Similar sounding words can be transcribed inadequately or may not be corrected upon review. Any transcriptional errors that result from this process are unintentional.      Disposition:  Follow up  Signed, Lindell Spar, MD  05/17/2022 8:22 AM     [facility]

## 2022-05-17 NOTE — Telephone Encounter (Signed)
Pt informed states understanding 

## 2022-05-20 ENCOUNTER — Ambulatory Visit: Payer: 59 | Admitting: Gastroenterology

## 2022-05-21 ENCOUNTER — Encounter: Payer: Self-pay | Admitting: Orthopaedic Surgery

## 2022-05-21 ENCOUNTER — Ambulatory Visit: Payer: 59 | Admitting: Orthopaedic Surgery

## 2022-05-21 ENCOUNTER — Ambulatory Visit (INDEPENDENT_AMBULATORY_CARE_PROVIDER_SITE_OTHER): Payer: 59

## 2022-05-21 VITALS — BP 130/74 | HR 75 | Ht 62.5 in | Wt 177.0 lb

## 2022-05-21 DIAGNOSIS — M542 Cervicalgia: Secondary | ICD-10-CM

## 2022-05-21 DIAGNOSIS — G8929 Other chronic pain: Secondary | ICD-10-CM | POA: Diagnosis not present

## 2022-05-21 DIAGNOSIS — M25512 Pain in left shoulder: Secondary | ICD-10-CM

## 2022-05-21 MED ORDER — DICLOFENAC SODIUM 75 MG PO TBEC: 75.0000 mg | DELAYED_RELEASE_TABLET | Freq: Two times a day (BID) | ORAL | 2 refills | Status: DC

## 2022-05-21 NOTE — Progress Notes (Signed)
I have pain in the left shoulder and arm.  I saw her last year for right shoulder pain.  She has developed left shoulder pain and pain to the left hand and index finger.  She has no trauma.  She was seen by [facility] recently and referred here.  She is using voltaren gel and had been on naprosyn with only slight help.  She has no weakness, no swelling of the shoulder.  The right shoulder is not hurting now.  Neck has full ROM and no spasm.  Examination of left Upper Extremity is done.  Inspection:   Overall:  Elbow non-tender without crepitus or defects, forearm non-tender without crepitus or defects, wrist non-tender without crepitus or defects, hand non-tender.    Shoulder: with glenohumeral joint tenderness, without effusion.   Upper arm:  without swelling and tenderness   Range of motion:   Overall:  Full range of motion of the elbow, full range of motion of wrist and full range of motion in fingers.   Shoulder:  left  150 degrees forward flexion; 145 degrees abduction; 30 degrees internal rotation, 30 degrees external rotation, 20 degrees extension, 40 degrees adduction.   Stability:   Overall:  Shoulder, elbow and wrist stable   Strength and Tone:   Overall full shoulder muscles strength, full upper arm strength and normal upper arm bulk and tone.   X-rays were done of the left shoulder and cervical spine.  Encounter Diagnoses  Name Primary?   Neck pain, chronic Yes   Chronic left shoulder pain    Stop the Naprosyn.  Begin diclofenac bid.  I will call it in.  Return in three weeks.  She may need MRI of the cervical spine.  Call if any problem.  Precautions discussed.  Electronically Signed Sanjuana Kava, MD 12/12/20232:38 PM

## 2022-06-07 ENCOUNTER — Telehealth: Payer: Self-pay | Admitting: Family Medicine

## 2022-06-07 NOTE — Telephone Encounter (Signed)
Left vm asking for a return call.

## 2022-06-07 NOTE — Telephone Encounter (Signed)
Returning call.

## 2022-06-07 NOTE — Telephone Encounter (Signed)
Patient called wants to cancel CPAP.  Patient has been having a hard time using machine and is realizing she is renting and does not want to spend said money to rent machine.   Patient wants a back

## 2022-06-08 DIAGNOSIS — G4733 Obstructive sleep apnea (adult) (pediatric): Secondary | ICD-10-CM | POA: Diagnosis not present

## 2022-06-11 ENCOUNTER — Encounter: Payer: Self-pay | Admitting: Orthopaedic Surgery

## 2022-06-11 ENCOUNTER — Ambulatory Visit: Payer: Self-pay | Admitting: Orthopaedic Surgery

## 2022-06-11 VITALS — BP 130/74 | Ht 62.5 in | Wt 177.0 lb

## 2022-06-11 DIAGNOSIS — M542 Cervicalgia: Secondary | ICD-10-CM

## 2022-06-11 DIAGNOSIS — M25511 Pain in right shoulder: Secondary | ICD-10-CM

## 2022-06-11 DIAGNOSIS — G8929 Other chronic pain: Secondary | ICD-10-CM

## 2022-06-11 NOTE — Progress Notes (Signed)
I am a little better.  She has been on the diclofenac and her neck pain and right shoulder pain is improved.  She has less paresthesias.  She has no new trauma.  ROM of the right shoulder is full and neck is full.  NV intact.  Encounter Diagnoses  Name Primary?   Neck pain, chronic Yes   Chronic right shoulder pain    Continue the diclofenac and consider MRI if pain returns.  Return in one month.  Call if any problem.  Precautions discussed.  Electronically Signed Sanjuana Kava, MD 1/2/20242:04 PM

## 2022-06-11 NOTE — Telephone Encounter (Signed)
Multiple attempts unable to reach pt.

## 2022-06-13 ENCOUNTER — Ambulatory Visit (INDEPENDENT_AMBULATORY_CARE_PROVIDER_SITE_OTHER): Payer: Self-pay | Admitting: Gastroenterology

## 2022-06-13 ENCOUNTER — Encounter: Payer: Self-pay | Admitting: Gastroenterology

## 2022-06-13 VITALS — BP 122/82 | HR 74 | Temp 97.0°F | Ht 62.5 in | Wt 179.6 lb

## 2022-06-13 DIAGNOSIS — R1310 Dysphagia, unspecified: Secondary | ICD-10-CM

## 2022-06-13 DIAGNOSIS — A048 Other specified bacterial intestinal infections: Secondary | ICD-10-CM

## 2022-06-13 NOTE — Patient Instructions (Addendum)
Please stop pantoprazole for 2 weeks, then complete the stool antigen test at [facility]. Once you submit the stool, you can resume the pantoprazole twice a day.   We are arranging an upper endoscopy with dilation by Dr. Gala Romney in the near future!  I have included a low FODMAP diet. Follow this for the next 2 weeks, then you can slowly introduce foods from the "high" list, seeing which ones may be causing more gas and bloating.   I enjoyed seeing you again today! As you know, I value our relationship and want to provide genuine, compassionate, and quality care. I welcome your feedback. If you receive a survey regarding your visit,  I greatly appreciate you taking time to fill this out. See you next time!  Annitta Needs, PhD, ANP-BC [facility]

## 2022-06-13 NOTE — Progress Notes (Addendum)
Gastroenterology Office Note     Primary Care Physician:  Alvira Monday, [place]  Primary Gastroenterologist: Dr. Gala Romney    Chief Complaint   Chief Complaint  Patient presents with   Follow-up    Pt complains of gas and food getting stuck     History of Present Illness   Jacqueline [name] is a 62 y.o. female presenting today in follow-up with a history of  GERD, dysphagia, LPR.  Colonoscopy up-to-date, due in 2026.  Last EGD 2022 with esophageal web s/p dilation,  H.pylori gastritis.  She did not complete the full Prevpac equivalent. H.pylori stool antigen checked and was positive. She was then prescribed Pylera equivalent. She completed this.  [name] eradication documentation.   Recurrent dysphagia noting for a few months. Solid food dysphagia. No pill dysphagia. Has gas daily. Comes out when unexpected. Drinks one soda to 1/2 daily. Tries to limit other gas-causing foods.      Past Medical History:  Diagnosis Date   Anemia    Arthritis    per patient, back   Complication of anesthesia 2018   per patient HR dropped during partial hysterectomy surgery   Fibroid uterus    GERD (gastroesophageal reflux disease)    H/O total hysterectomy    Hyperlipidemia    Borderline   Hypertension    Numbness and tingling in right hand    Pre-diabetes    borderline    Past Surgical History:  Procedure Laterality Date   BACK SURGERY     BALLOON DILATION N/A 05/22/2021   Procedure: BALLOON DILATION;  Surgeon: Eloise Harman, DO;  [facility];  Service: Endoscopy;  Laterality: N/A;   CHOLECYSTECTOMY     COLONOSCOPY  2003   Dr. Gala Romney: Anal canal hemorrhoids   COLONOSCOPY N/A 08/25/2014   Normal colonoscopy.  Next colonoscopy in 2026.  Procedure: COLONOSCOPY;  Surgeon: Daneil Dolin, MD;  [facility];  Service: Endoscopy;  Laterality: N/A;  1200   ESOPHAGOGASTRODUODENOSCOPY N/A 08/25/2014   Procedure: ESOPHAGOGASTRODUODENOSCOPY (EGD);  Surgeon: Daneil Dolin, MD;  [facility];  Service: Endoscopy;  Laterality: N/A;   ESOPHAGOGASTRODUODENOSCOPY (EGD) WITH PROPOFOL N/A 05/22/2021   Web and distal esophagus, dilated, gastriti, biopsy positive for mildly active H. pylori gastritis.  s procedure: ESOPHAGOGASTRODUODENOSCOPY (EGD) WITH PROPOFOL;  Surgeon: Eloise Harman, DO;  [facility];  Service: Endoscopy;  Laterality: N/A;  9:15am   SALPINGOOPHORECTOMY Bilateral 11/06/2016   Procedure: BILATERAL SALPINGO OOPHORECTOMY;  Surgeon: Florian Buff, MD;  Location: [facility];  Service: Gynecology;  Laterality: Bilateral;   SUPRACERVICAL ABDOMINAL HYSTERECTOMY N/A 11/06/2016   Procedure: HYSTERECTOMY SUPRACERVICAL ABDOMINAL;  Surgeon: Florian Buff, MD;  Location: [facility];  Service: Gynecology;  Laterality: N/A;   TRANSFORAMINAL LUMBAR INTERBODY FUSION (TLIF) WITH PEDICLE SCREW FIXATION 2 LEVEL Left 07/28/2019   Procedure: LEFT-SIDED LUMBAR 4- 5, LUMBAR 5 -SACRUM1 TRANSFORAMINAL LUMBAR INTERBODY FUSION WITH INSTRUMENTATION AND ALLOGRAFT;  Surgeon: Phylliss Bob, MD;  Location: [place];  Service: Orthopedics;  Laterality: Left;   TUBAL LIGATION      Current Outpatient Medications  Medication Sig Dispense Refill   acyclovir (ZOVIRAX) 400 MG tablet TAKE 1 TABLET (400 MG TOTAL) BY MOUTH DAILY AS NEEDED (OUTBREAKS) 90 tablet 1   amLODipine (NORVASC) 5 MG tablet Take 1 tablet by mouth once daily 90 tablet 0   atorvastatin (LIPITOR) 20 MG tablet Take 1 tablet by mouth once daily 90 tablet 0   diclofenac (VOLTAREN)  75 MG EC tablet Take 1 tablet (75 mg total) by mouth 2 (two) times daily with a meal. 60 tablet 2   estradiol (ESTRACE) 0.1 MG/GM vaginal cream INSERT 2 GRAMS VAGINALLY AS DIRECTED EVERY OTHER NIGHT 43 g 11   fluticasone (FLONASE) 50 MCG/ACT nasal spray Use 2 sprays in each nostril BID for a week. After 1 week, decrease to 1 spray in each nostril BID as needed for congestion/allergies. 16 g 6   loratadine (CLARITIN) 10 MG tablet       methocarbamol (ROBAXIN) 500 MG tablet TAKE 1 TABLET BY MOUTH EVERY 6 HOURS AS NEEDED FOR CHRONIC BACK PAIN 30 tablet 2   Multiple Vitamin (MULTIVITAMIN WITH MINERALS) TABS tablet Take 1 tablet by mouth daily with supper. One-A-Day Multivitamin     pantoprazole (PROTONIX) 40 MG tablet Take 1 tablet (40 mg total) by mouth 2 (two) times daily before a meal. 180 tablet 0   traZODone (DESYREL) 50 MG tablet Take 0.5-1 tablets (25-50 mg total) by mouth at bedtime as needed for sleep. 30 tablet 3   No current facility-administered medications for this visit.    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   Smoking status: Never   Smokeless tobacco: Never   Tobacco comments:    Never smoked  Vaping Use   Vaping Use: Never used  Substance and Sexual Activity   Alcohol use: No    Alcohol/week: 0.0 standard drinks of alcohol   Drug use: No   Sexual activity: Yes    Birth control/protection: Post-menopausal, Surgical  Other Topics Concern   Not on file  Social History Narrative   Lives with her spouse, work part at [employer]    Social Determinants of Health   Financial Resource Strain: Not on file  Food Insecurity: Not on file  Transportation [name]: Not on file  Physical Activity: Not on file  Stress: Not on file  Social Connections: Not on file  Intimate Partner Violence: Not on file     Review of Systems   Gen: Denies any fever, chills, fatigue, weight loss, lack of appetite.  CV:  Denies chest pain, heart palpitations, peripheral edema, syncope.  Resp: Denies shortness of breath at rest or with exertion. Denies wheezing or cough.  GI: see HPI GU : Denies urinary burning, urinary frequency, urinary hesitancy MS: Denies joint pain, muscle weakness, cramps, or limitation of movement.  Derm: Denies rash, itching, dry skin Psych: Denies depression, anxiety, memory loss, and confusion Heme: Denies bruising, bleeding, and enlarged lymph nodes.   Physical Exam   BP 122/82   Pulse 74   Temp (!) 97 F (36.1 C)   Ht 5' 2.5" (1.588 m)   Wt 179 lb 9.6 oz (81.5 kg)   LMP 10/26/2016 (Exact Date)   BMI 32.33 kg/m  General:  Alert and oriented. Pleasant and cooperative. Well-nourished and well-developed.  Head:  Normocephalic and atraumatic. Eyes:  Without icterus Abdomen:  +BS, soft, non-tender and non-distended. No HSM noted. No guarding or rebound. No masses appreciated.  Rectal:  Deferred  Msk:  Symmetrical without gross deformities. Normal posture. Extremities:  Without edema. Neurologic:  Alert and  oriented x4;  grossly normal neurologically. Skin:  Intact without significant lesions or rashes. Psych:  Alert and cooperative. Normal mood and affect.   Assessment   Jacqueline [name] is a 63 y.o. female presenting today in follow-up with a history of GERD, dysphagia, LPR.  Colonoscopy up-to-date, due in 2026.  Last EGD 2022 with esophageal web s/p dilation,  H.pylori gastritis.  Now with recurrent dysphagia to solid foods. Will arrange EGD/dilation in near future. She noted symptomatic improvement historically with prior dilation.  H.pylori gastritis: incomplete course of Prevpac previously, with positive H.pylori stool antigen. Completed Pylera. We will check again for eradication.   Gas/bloating: without pain. Low FODMAP diet provided.     PLAN   Proceed with upper endoscopy/dilation by Dr. Gala Romney in near future: the risks, benefits, and alternatives have been  discussed with the patient in detail. The patient states understanding and desires to proceed.  Hold PPI X 2 weeks then submit stool antigen Low FODMAP diet Further recommendations to follow    Jacqueline Needs, PhD, ANP-BC [facility]   ADDENDUM: Jacqueline [name]/Jacqueline [name]: we need to arrange the EGD. Her insurance had changed as of 2/1. She had also been positive for Covid previously and had to cancel EGD.   EGD was to be arranged due to recurrent dysphagia. We had tested H.pylori stool antigen but this is still positive despite Prevpac and Pylera course. I spoke with Jacqueline [name] in Endo: at time of EGD, need gastric biopsies for H.pylori culture. [facility] test code is [phone],  Gastric biopsy placed in the orange stool culture vial is acceptable specimen type.  Jacqueline [name] is aware of this and will need to have a heads up when this EGD is scheduled so that she has the appropriate vial and lab orders ready. So yes it is orderable just may be a [facility] miscellaneous test in [place]. If positive then it should reflex the antibiotic sensitivity.

## 2022-06-14 ENCOUNTER — Telehealth: Payer: Self-pay | Admitting: *Deleted

## 2022-06-14 ENCOUNTER — Telehealth: Payer: Self-pay | Admitting: Family Medicine

## 2022-06-14 NOTE — Telephone Encounter (Signed)
Pt called and left vm stating that she was at work and would be able to call back at 2 pm today.

## 2022-06-14 NOTE — Telephone Encounter (Signed)
Wants to know if an order can please be sent to cancel her CPAP so that she will not be charged anymore? Pt asked if you can please call her when this is done?

## 2022-06-14 NOTE — Telephone Encounter (Signed)
Spoke to [employer], cancelled order. Tried to call pt 3x to let her know unable to leave message.

## 2022-06-14 NOTE — Telephone Encounter (Signed)
LMTRC  Need to schedule EGD/ED with Dr.Rourk ASA 2 dysphagia

## 2022-06-17 ENCOUNTER — Telehealth: Payer: Self-pay | Admitting: Family Medicine

## 2022-06-17 ENCOUNTER — Encounter: Payer: Self-pay | Admitting: *Deleted

## 2022-06-17 NOTE — Telephone Encounter (Signed)
Pt called stating she still is unable to sleep. Can you please contact patient?

## 2022-06-18 ENCOUNTER — Telehealth: Payer: Self-pay

## 2022-06-18 NOTE — Telephone Encounter (Signed)
Spoke to pt she is asking to r/s her visit of 06/25/2022 to 07/03/2022 @ 9am, can you please help with this?

## 2022-06-18 NOTE — Telephone Encounter (Signed)
Pt called and left a vm requesting a return call back from the nurse.

## 2022-06-18 NOTE — Telephone Encounter (Signed)
Returned the pt's call and was advised she didn't call us. Advised if she thinks of maybe why she would have to please call back. Pt agreed

## 2022-06-19 NOTE — Telephone Encounter (Signed)
Pt has been scheduled for 07/08/22 with Dr.Rourk. Instructions have been mailed.

## 2022-06-25 ENCOUNTER — Ambulatory Visit: Payer: 59 | Admitting: Nurse Practitioner

## 2022-06-25 ENCOUNTER — Ambulatory Visit: Payer: 59 | Admitting: Family Medicine

## 2022-07-02 ENCOUNTER — Telehealth: Payer: Self-pay | Admitting: Family Medicine

## 2022-07-02 ENCOUNTER — Ambulatory Visit
Admission: EM | Admit: 2022-07-02 | Discharge: 2022-07-02 | Disposition: A | Discharge status: Home / Self Care | Payer: Self-pay | Attending: Nurse Practitioner | Admitting: Nurse Practitioner

## 2022-07-02 ENCOUNTER — Other Ambulatory Visit: Payer: Self-pay

## 2022-07-02 ENCOUNTER — Encounter: Payer: Self-pay | Admitting: Emergency Medicine

## 2022-07-02 DIAGNOSIS — J301 Allergic rhinitis due to pollen: Secondary | ICD-10-CM | POA: Insufficient documentation

## 2022-07-02 DIAGNOSIS — Z1152 Encounter for screening for COVID-19: Secondary | ICD-10-CM | POA: Insufficient documentation

## 2022-07-02 DIAGNOSIS — J069 Acute upper respiratory infection, unspecified: Secondary | ICD-10-CM | POA: Insufficient documentation

## 2022-07-02 MED ORDER — FLUTICASONE PROPIONATE 50 MCG/ACT NA SUSP: 1.0000 | Freq: Every day | NASAL | 6 refills | Status: DC

## 2022-07-02 NOTE — ED Provider Notes (Signed)
[facility]    CSN: [id number] Arrival date & time: 07/02/22  1125      History   Chief Complaint Chief Complaint  Patient presents with   Cough    HPI Jacqueline [name] is a 62 y.o. female.   Patient presents today for 4-day history of productive and dry cough, nasal congestion and runny nose, sneezing, sore throat, decreased appetite, loss of taste, and fatigue.  She also has a little bit of drainage on the back of her throat which makes her chest feel tight.  She denies fever, shortness of breath, chest pain, chest congestion, headache, ear pain, abdominal pain, nausea/vomiting, and diarrhea.  No known sick contacts.  Has been drinking warm tea for her symptoms without much benefit.  Patient denies history of chronic lung disease.  Denies smoking or vaping history.    Past Medical History:  Diagnosis Date   Anemia    Arthritis    per patient, back   Complication of anesthesia 2018   per patient HR dropped during partial hysterectomy surgery   Fibroid uterus    GERD (gastroesophageal reflux disease)    H/O total hysterectomy    Hyperlipidemia    Borderline   Hypertension    Numbness and tingling in right hand    Pre-diabetes    borderline    Patient Active Problem List   Diagnosis Date Noted   Need for immunization against influenza 02/28/2022   Screening for cervical cancer 12/21/2021   H. pylori infection 12/05/2021   Snoring 11/23/2021   Insomnia 11/23/2021   Annual physical exam 10/16/2021   Dermatitis 10/16/2021   Need for varicella vaccine 10/16/2021   Diarrhea 07/05/2021   Stomach cramps 07/05/2021   Nausea & vomiting 07/05/2021   Encounter for vaccination 07/05/2021   Right foot pain 05/16/2021   Dysphagia 05/01/2021   Right shoulder pain 12/27/2020   Rash 12/13/2020   Chronic back pain 04/04/2020   History of back surgery 04/04/2020   Radiculopathy 07/28/2019   Borderline diabetes 12/25/2018   Hypertension 06/30/2018   S/P  abdominal supracervical subtotal hysterectomy 11/06/2016   Trigger middle finger of right hand 04/24/2016   GERD (gastroesophageal reflux disease) 08/04/2014   Shoulder strain 07/26/2014   Degenerative arthritis of thumb 07/26/2014   Left knee pain 02/28/2014   Hip pain 04/15/2013   Epicondylitis, lateral (tennis elbow) 04/13/2013   Constipation 09/15/2012   Hyperlipidemia 11/28/2011   Obesity, Class I, BMI 30.0-34.9 (see actual BMI) 09/15/2011   ALLERGIC RHINITIS, SEASONAL 11/13/2007    Past Surgical History:  Procedure Laterality Date   BACK SURGERY     BALLOON DILATION N/A 05/22/2021   Procedure: BALLOON DILATION;  Surgeon: Eloise Harman, DO;  [facility];  Service: Endoscopy;  Laterality: N/A;   CHOLECYSTECTOMY     COLONOSCOPY  2003   Dr. Gala Romney: Anal canal hemorrhoids   COLONOSCOPY N/A 08/25/2014   Normal colonoscopy.  Next colonoscopy in 2026.  Procedure: COLONOSCOPY;  Surgeon: Daneil Dolin, MD;  [facility];  Service: Endoscopy;  Laterality: N/A;  1200   ESOPHAGOGASTRODUODENOSCOPY N/A 08/25/2014   Procedure: ESOPHAGOGASTRODUODENOSCOPY (EGD);  Surgeon: Daneil Dolin, MD;  [facility];  Service: Endoscopy;  Laterality: N/A;   ESOPHAGOGASTRODUODENOSCOPY (EGD) WITH PROPOFOL N/A 05/22/2021   Web and distal esophagus, dilated, gastriti, biopsy positive for mildly active H. pylori gastritis.  s procedure: ESOPHAGOGASTRODUODENOSCOPY (EGD) WITH PROPOFOL;  Surgeon: Eloise Harman, DO;  [facility];  Service: Endoscopy;  Laterality: N/A;  9:15am   SALPINGOOPHORECTOMY Bilateral 11/06/2016   Procedure: BILATERAL SALPINGO OOPHORECTOMY;  Surgeon: Florian Buff, MD;  [facility];  Service: Gynecology;  Laterality: Bilateral;   SUPRACERVICAL ABDOMINAL HYSTERECTOMY N/A 11/06/2016   Procedure: HYSTERECTOMY SUPRACERVICAL ABDOMINAL;  Surgeon: Florian Buff, MD;  [facility];  Service: Gynecology;  Laterality: N/A;   TRANSFORAMINAL  LUMBAR INTERBODY FUSION (TLIF) WITH PEDICLE SCREW FIXATION 2 LEVEL Left 07/28/2019   Procedure: LEFT-SIDED LUMBAR 4- 5, LUMBAR 5 -SACRUM1 TRANSFORAMINAL LUMBAR INTERBODY FUSION WITH INSTRUMENTATION AND ALLOGRAFT;  Surgeon: Phylliss Bob, MD;  Location: [place];  Service: Orthopedics;  Laterality: Left;   TUBAL LIGATION      OB History     Gravida  3   Para  3   Term      Preterm      AB      Living  3      SAB      IAB      Ectopic      Multiple      Live Births               Home Medications    Prior to Admission medications   Medication Sig Start Date End Date Taking? Authorizing Provider  acyclovir (ZOVIRAX) 400 MG tablet TAKE 1 TABLET (400 MG TOTAL) BY MOUTH DAILY AS NEEDED (OUTBREAKS) 04/01/22   Alvira Monday, FNP  amLODipine (NORVASC) 5 MG tablet Take 1 tablet by mouth once daily 05/14/22   Alvira Monday, FNP  atorvastatin (LIPITOR) 20 MG tablet Take 1 tablet by mouth once daily 05/09/22   Alvira Monday, FNP  diclofenac (VOLTAREN) 75 MG EC tablet Take 1 tablet (75 mg total) by mouth 2 (two) times daily with a meal. 05/21/22   Sanjuana Kava, MD  estradiol (ESTRACE) 0.1 MG/GM vaginal cream INSERT 2 GRAMS VAGINALLY AS DIRECTED EVERY OTHER NIGHT 04/10/22   Florian Buff, MD  fluticasone (FLONASE) 50 MCG/ACT nasal spray Place 1 spray into both nostrils daily. 07/02/22   Eulogio Bear, NP  methocarbamol (ROBAXIN) 500 MG tablet TAKE 1 TABLET BY MOUTH EVERY 6 HOURS AS NEEDED FOR CHRONIC BACK PAIN 03/15/22   Alvira Monday, FNP  Multiple Vitamin (MULTIVITAMIN WITH MINERALS) TABS tablet Take 1 tablet by mouth daily with supper. One-A-Day Multivitamin    [provider]  pantoprazole (PROTONIX) 40 MG tablet Take 1 tablet (40 mg total) by mouth 2 (two) times daily before a meal. 03/26/22   Alvira Monday, FNP  traZODone (DESYREL) 50 MG tablet Take 0.5-1 tablets (25-50 mg total) by mouth at bedtime as needed for sleep. Patient not taking: Reported on  07/02/2022 03/22/22   Alvira Monday, FNP    Family History Family History  Problem Relation Age of Onset   Breast cancer Maternal Aunt    Hypertension Maternal Aunt    Cancer Maternal Aunt        Breast   Cancer Maternal Grandmother         lymphnodes   Colon cancer Neg Hx     Social History Social History   Tobacco Use   Smoking status: Never   Smokeless tobacco: Never   Tobacco comments:    Never smoked  Vaping Use   Vaping Use: Never used  Substance Use Topics   Alcohol use: No    Alcohol/week: 0.0 standard drinks of alcohol   Drug use: No     Allergies   Patient has no known  allergies.   Review of Systems Review of Systems Per HPI  Physical Exam Triage Vital Signs ED Triage Vitals  Enc Vitals Group     BP 07/02/22 1139 110/74     Pulse Rate 07/02/22 1139 66     Resp 07/02/22 1139 20     Temp 07/02/22 1139 98.3 F (36.8 C)     Temp Source 07/02/22 1139 Oral     SpO2 07/02/22 1139 97 %     Weight --      Height --      Head Circumference --      Peak Flow --      Pain Score 07/02/22 1137 5     Pain Loc --      Pain Edu? --      Excl. in [place]? --    No data found.  Updated Vital Signs BP 110/74 (BP Location: Right Arm)   Pulse 66   Temp 98.3 F (36.8 C) (Oral)   Resp 20   LMP 10/26/2016 (Exact Date)   SpO2 97%   Visual Acuity Right Eye Distance:   Left Eye Distance:   Bilateral Distance:    Right Eye Near:   Left Eye Near:    Bilateral Near:     Physical Exam Vitals and nursing note reviewed.  Constitutional:      General: She is not in acute distress.    Appearance: Normal appearance. She is not ill-appearing or toxic-appearing.  HENT:     Head: Normocephalic and atraumatic.     Right Ear: Ear canal and external ear normal. A middle ear effusion is present.     Left Ear: Ear canal and external ear normal. A middle ear effusion is present.     Nose: Congestion present. No rhinorrhea.     Right Sinus: No maxillary sinus  tenderness or frontal sinus tenderness.     Left Sinus: No maxillary sinus tenderness or frontal sinus tenderness.     Mouth/Throat:     Mouth: Mucous membranes are moist.     Pharynx: Oropharynx is clear. Posterior oropharyngeal erythema present. No oropharyngeal exudate.     Tonsils: No tonsillar exudate. 0 on the right. 0 on the left.  Eyes:     General: No scleral icterus.    Extraocular Movements: Extraocular movements intact.  Cardiovascular:     Rate and Rhythm: Normal rate and regular rhythm.  Pulmonary:     Effort: Pulmonary effort is normal. No respiratory distress.     Breath sounds: Normal breath sounds. No wheezing, rhonchi or rales.  Abdominal:     General: Abdomen is flat. Bowel sounds are normal. There is no distension.     Palpations: Abdomen is soft.     Tenderness: There is no abdominal tenderness.  Musculoskeletal:     Cervical back: Normal range of motion and neck supple.  Lymphadenopathy:     Cervical: Cervical adenopathy present.  Skin:    General: Skin is warm and dry.     Coloration: Skin is not jaundiced or pale.     Findings: No erythema or rash.  Neurological:     Mental Status: She is alert and oriented to person, place, and time.  Psychiatric:        Behavior: Behavior is cooperative.      UC Treatments / Results  Labs (all labs ordered are listed, but only abnormal results are displayed) Labs Reviewed  SARS CORONAVIRUS 2 (TAT 6-24 HRS)    EKG  Radiology No results found.  Procedures Procedures (including critical care time)  Medications Ordered in UC Medications - No data to display  Initial Impression / Assessment and Plan / UC Course  I have reviewed the triage vital signs and the nursing notes.  Pertinent labs & imaging results that were available during my care of the patient were reviewed by me and considered in my medical decision making (see chart for details).   Patient is well-appearing, normotensive, afebrile, not  tachycardic, not tachypneic, oxygenating well on room air.   1. Encounter for screening for COVID-19 2. Viral URI with cough Symptoms are consistent with viral etiology COVID-19 testing obtained; unable to test for influenza secondary to national shortage of testing materials Supportive care discussed with patient Patient is a candidate for molnupiravir if she tests positive-unable to take Paxlovid secondary to amlodipine use Recommend starting Flonase to help with fluid behind the ears, postnasal drainage Also recommended over-the-counter guaifenesin ER and return precautions discussed with patient Note given for work  The patient was given the opportunity to ask questions.  All questions answered to their satisfaction.  The patient is in agreement to this plan.  Final Clinical Impressions(s) / UC Diagnoses   Final diagnoses:  Encounter for screening for COVID-19  Viral URI with cough     Discharge Instructions      You have a viral upper respiratory infection.  Symptoms should improve over the next week to 10 days.  If you develop chest pain or shortness of breath, go to the emergency room.  We have tested you today for COVID-19.  You will see the results in Mychart and we will call you with positive results.  Please stay home and isolate until you are aware of the results.    Some things that can make you feel better are: - Increased rest - Increasing fluid with water/sugar free electrolytes - Acetaminophen and ibuprofen as needed for fever/pain - Salt water gargling, chloraseptic spray and throat lozenges for sore throat - OTC guaifenesin (Mucinex) 600 mg twice daily for congestion - Saline sinus flushes or a neti pot - Humidifying the air     ED Prescriptions     Medication Sig Dispense Auth. Provider   fluticasone (FLONASE) 50 MCG/ACT nasal spray Place 1 spray into both nostrils daily. 16 g Eulogio Bear, NP      PDMP not reviewed this encounter.    Eulogio Bear, NP 07/02/22 1209

## 2022-07-02 NOTE — ED Triage Notes (Signed)
Pt reports runny nose, sneezing, coughing,headache, loss of taste since Friday.

## 2022-07-02 NOTE — Discharge Instructions (Addendum)

## 2022-07-03 ENCOUNTER — Telehealth (HOSPITAL_COMMUNITY): Payer: Self-pay | Admitting: Emergency Medicine

## 2022-07-03 LAB — SARS CORONAVIRUS 2 (TAT 6-24 HRS): SARS Coronavirus 2: POSITIVE — AB

## 2022-07-03 MED ORDER — MOLNUPIRAVIR EUA 200MG CAPSULE: 4.0000 | ORAL_CAPSULE | Freq: Two times a day (BID) | ORAL | 0 refills | Status: AC

## 2022-07-04 ENCOUNTER — Telehealth: Payer: Self-pay | Admitting: *Deleted

## 2022-07-04 NOTE — Telephone Encounter (Signed)
Message from Endo: Jacqueline [name]  for Dr. Gala Romney is positive for Covid. She is scheduled for Monday. She needs to be rescheduled. Thank you!  Called pt to reschedule and she also said she has a new insurance that want take effect until 07/11/22. Advised pt will call her back after the 1st of February. Verbalized understanding

## 2022-07-08 ENCOUNTER — Encounter (HOSPITAL_COMMUNITY): Admission: RE | Payer: Self-pay | Source: Home / Self Care

## 2022-07-08 ENCOUNTER — Ambulatory Visit (HOSPITAL_COMMUNITY): Admission: RE | Admit: 2022-07-08 | Payer: 59 | Source: Home / Self Care | Admitting: Internal Medicine

## 2022-07-08 SURGERY — ESOPHAGOGASTRODUODENOSCOPY (EGD) WITH PROPOFOL
Anesthesia: Monitor Anesthesia Care

## 2022-07-12 ENCOUNTER — Ambulatory Visit: Payer: 59 | Admitting: Family Medicine

## 2022-07-13 DIAGNOSIS — A048 Other specified bacterial intestinal infections: Secondary | ICD-10-CM | POA: Diagnosis not present

## 2022-07-16 ENCOUNTER — Ambulatory Visit: Payer: 59 | Admitting: Orthopaedic Surgery

## 2022-07-17 ENCOUNTER — Telehealth: Payer: Self-pay

## 2022-07-17 LAB — H. PYLORI ANTIGEN, STOOL: H pylori Ag, Stl: POSITIVE — AB

## 2022-07-17 NOTE — Telephone Encounter (Signed)
Pt's lab show positive for H Pylori

## 2022-07-30 ENCOUNTER — Encounter (INDEPENDENT_AMBULATORY_CARE_PROVIDER_SITE_OTHER): Payer: Self-pay | Admitting: *Deleted

## 2022-08-01 ENCOUNTER — Ambulatory Visit: Payer: Self-pay | Admitting: Orthopaedic Surgery

## 2022-08-02 ENCOUNTER — Telehealth: Payer: Self-pay | Admitting: *Deleted

## 2022-08-02 NOTE — Telephone Encounter (Signed)
Pt states she is still waiting to hear back from insurance and once she gets things straight she will call back to schedule her procedure. FYI

## 2022-08-06 ENCOUNTER — Ambulatory Visit: Payer: 59 | Admitting: Family Medicine

## 2022-08-06 ENCOUNTER — Other Ambulatory Visit: Payer: Self-pay | Admitting: Family Medicine

## 2022-08-06 DIAGNOSIS — I10 Essential (primary) hypertension: Secondary | ICD-10-CM

## 2022-08-08 ENCOUNTER — Encounter: Payer: Self-pay | Admitting: Radiology

## 2022-08-16 ENCOUNTER — Emergency Department (HOSPITAL_COMMUNITY)
Admission: EM | Admit: 2022-08-16 | Discharge: 2022-08-16 | Disposition: A | Discharge status: Home / Self Care | Payer: Medicaid Other | Attending: Emergency Medicine | Admitting: Emergency Medicine

## 2022-08-16 ENCOUNTER — Emergency Department (HOSPITAL_COMMUNITY): Payer: Medicaid Other

## 2022-08-16 ENCOUNTER — Ambulatory Visit
Admission: EM | Admit: 2022-08-16 | Discharge: 2022-08-16 | Disposition: A | Discharge status: Home / Self Care | Payer: Self-pay

## 2022-08-16 ENCOUNTER — Telehealth: Payer: Self-pay | Admitting: Family Medicine

## 2022-08-16 ENCOUNTER — Telehealth: Payer: Self-pay | Admitting: Orthopaedic Surgery

## 2022-08-16 ENCOUNTER — Encounter (HOSPITAL_COMMUNITY): Payer: Self-pay | Admitting: Emergency Medicine

## 2022-08-16 ENCOUNTER — Other Ambulatory Visit: Payer: Self-pay

## 2022-08-16 DIAGNOSIS — R079 Chest pain, unspecified: Secondary | ICD-10-CM

## 2022-08-16 DIAGNOSIS — R0789 Other chest pain: Secondary | ICD-10-CM | POA: Insufficient documentation

## 2022-08-16 DIAGNOSIS — M5412 Radiculopathy, cervical region: Secondary | ICD-10-CM | POA: Diagnosis not present

## 2022-08-16 LAB — CBC
HCT: 37.4 % (ref 36.0–46.0)
Hemoglobin: 12.5 g/dL (ref 12.0–15.0)
MCH: 30.6 pg (ref 26.0–34.0)
MCHC: 33.4 g/dL (ref 30.0–36.0)
MCV: 91.7 fL (ref 80.0–100.0)
Platelets: 248 10*3/uL (ref 150–400)
RBC: 4.08 MIL/uL (ref 3.87–5.11)
RDW: 13.4 % (ref 11.5–15.5)
WBC: 5 10*3/uL (ref 4.0–10.5)
nRBC: 0 % (ref 0.0–0.2)

## 2022-08-16 LAB — BASIC METABOLIC PANEL
Anion gap: 6 (ref 5–15)
BUN: 15 mg/dL (ref 6–20)
CO2: 25 mmol/L (ref 22–32)
Calcium: 8.9 mg/dL (ref 8.9–10.3)
Chloride: 102 mmol/L (ref 98–111)
Creatinine, Ser: 0.92 mg/dL (ref 0.44–1.00)
GFR, Estimated: >60 mL/min (ref >60)
Glucose, Bld: 97 mg/dL (ref 70–99)
Potassium: 3.5 mmol/L (ref 3.5–5.1)
Sodium: 133 mmol/L — ABNORMAL LOW (ref 135–145)

## 2022-08-16 LAB — TROPONIN I (HIGH SENSITIVITY)
Troponin I (High Sensitivity): 2 ng/L (ref <18)
Troponin I (High Sensitivity): 2 ng/L (ref <18)

## 2022-08-16 NOTE — Telephone Encounter (Signed)
Patient lvm stating that she wanted to let us know she hasn't been back to see [place] because she doesn't have any insurance.  She stated that her arms are still bothering her.

## 2022-08-16 NOTE — Discharge Instructions (Signed)
You are seen in the emergency department for worsening left-sided neck pain radiating into your chest and left arm.  You had blood work and chest x-ray, EKG that did not show any evidence of heart injury.  Please continue your regular medications and follow-up with your primary care doctor and spine specialist.  Return to the emergency department if any worsening or concerning symptoms

## 2022-08-16 NOTE — ED Provider Notes (Signed)
[facility] Provider Note   CSN: [id number] Arrival date & time: 08/16/22  1645     History {Add pertinent medical, surgical, social history, OB history to HPI:1} Chief Complaint  Patient presents with   Arm Pain    Jacqueline [name] is a 62 y.o. female.  She was sent here from urgent care for evaluation of some pain in her neck and chest.  She has chronic neck pain radiating to right arm and hand pain and paresthesias.  She has been diagnosed with arthritis and radiculopathy and sees orthopedics for this.  Last night she also experienced some pain in her left lateral neck and jaw that radiated into her chest.  She also had some pain going down her left arm.  It was not associated with shortness of breath diaphoresis nausea vomiting.  It still going on now although a little bit better.  The history is provided by the patient.  Chest Pain Pain location:  L chest Pain quality: aching   Pain radiates to:  Neck and L arm Pain severity:  Moderate Onset quality:  Gradual Duration:  24 hours Timing:  Constant Chronicity:  New Relieved by:  None tried Worsened by:  Nothing Ineffective treatments:  None tried Associated symptoms: numbness   Associated symptoms: no abdominal pain, no cough, no dizziness, no dysphagia, no fever, no nausea, no shortness of breath, no vomiting and no weakness   Risk factors: no coronary artery disease and no smoking        Home Medications Prior to Admission medications   Medication Sig Start Date End Date Taking? Authorizing Provider  acyclovir (ZOVIRAX) 400 MG tablet TAKE 1 TABLET (400 MG TOTAL) BY MOUTH DAILY AS NEEDED (OUTBREAKS) 04/01/22   Alvira Monday, FNP  amLODipine (NORVASC) 5 MG tablet Take 1 tablet by mouth once daily 08/06/22   Alvira Monday, FNP  atorvastatin (LIPITOR) 20 MG tablet Take 1 tablet by mouth once daily 08/06/22   Alvira Monday, FNP  diclofenac (VOLTAREN) 75 MG EC tablet Take 1  tablet (75 mg total) by mouth 2 (two) times daily with a meal. 05/21/22   Sanjuana Kava, MD  estradiol (ESTRACE) 0.1 MG/GM vaginal cream INSERT 2 GRAMS VAGINALLY AS DIRECTED EVERY OTHER NIGHT 04/10/22   Florian Buff, MD  fluticasone (FLONASE) 50 MCG/ACT nasal spray Place 1 spray into both nostrils daily. 07/02/22   Eulogio Bear, NP  methocarbamol (ROBAXIN) 500 MG tablet TAKE 1 TABLET BY MOUTH EVERY 6 HOURS AS NEEDED FOR CHRONIC BACK PAIN 03/15/22   Alvira Monday, FNP  Multiple Vitamin (MULTIVITAMIN WITH MINERALS) TABS tablet Take 1 tablet by mouth daily with supper. One-A-Day Multivitamin    [provider]  pantoprazole (PROTONIX) 40 MG tablet Take 1 tablet (40 mg total) by mouth 2 (two) times daily before a meal. 03/26/22   Alvira Monday, FNP  traZODone (DESYREL) 50 MG tablet Take 0.5-1 tablets (25-50 mg total) by mouth at bedtime as needed for sleep. Patient not taking: Reported on 07/02/2022 03/22/22   Alvira Monday, [place]      Allergies    Patient has no known allergies.    Review of Systems   Review of Systems  Constitutional:  Negative for fever.  HENT:  Negative for trouble swallowing.   Eyes:  Negative for visual disturbance.  Respiratory:  Negative for cough and shortness of breath.   Cardiovascular:  Positive for chest pain.  Gastrointestinal:  Negative for abdominal pain, nausea and  vomiting.  Musculoskeletal:  Positive for neck pain.  Neurological:  Positive for numbness. Negative for dizziness, speech difficulty and weakness.    Physical Exam Updated Vital Signs BP (!) 125/99 (BP Location: Left Arm)   Pulse 71   Temp 98.3 F (36.8 C) (Oral)   Resp 16   Ht 5' 2.5" (1.588 m)   Wt 83 kg   LMP 10/26/2016 (Exact Date)   SpO2 100%   BMI 32.94 kg/m  Physical Exam Vitals and nursing note reviewed.  Constitutional:      General: She is not in acute distress.    Appearance: Normal appearance. She is well-developed.  HENT:     Head: Normocephalic and  atraumatic.  Eyes:     Conjunctiva/sclera: Conjunctivae normal.  Cardiovascular:     Rate and Rhythm: Normal rate and regular rhythm.     Heart sounds: No murmur heard. Pulmonary:     Effort: Pulmonary effort is normal. No respiratory distress.     Breath sounds: Normal breath sounds.  Abdominal:     Palpations: Abdomen is soft.     Tenderness: There is no abdominal tenderness.  Musculoskeletal:        General: No deformity.     Cervical back: Neck supple. Tenderness (posterior and left lateral) present.  Skin:    General: Skin is warm and dry.     Capillary Refill: Capillary refill takes less than 2 seconds.  Neurological:     General: No focal deficit present.     Mental Status: She is alert.     Motor: No weakness.     ED Results / Procedures / Treatments   Labs (all labs ordered are listed, but only abnormal results are displayed) Labs Reviewed  BASIC METABOLIC PANEL - Abnormal; Notable for the following components:      Result Value   Sodium 133 (*)    All other components within normal limits  CBC  TROPONIN I (HIGH SENSITIVITY)  TROPONIN I (HIGH SENSITIVITY)    EKG EKG Interpretation  Date/Time:  Friday August 16 2022 17:05:03 EST Ventricular Rate:  66 PR Interval:  154 QRS Duration: 78 QT Interval:  390 QTC Calculation: 408 R Axis:   107 Text Interpretation: Normal sinus rhythm Rightward axis Septal infarct (cited on or before 16-Aug-2022) Abnormal ECG When compared with ECG of 16-Aug-2022 15:22, QRS axis Shifted right Inverted T waves have replaced nonspecific T wave abnormality in Inferior leads Confirmed by Aletta Edouard [phone]) on 08/16/2022 5:26:09 PM  Radiology No results found.  Procedures Procedures  {Document cardiac monitor, telemetry assessment procedure when appropriate:1}  Medications Ordered in ED Medications - No data to display  ED Course/ Medical Decision Making/ A&P   {   Click here for ABCD2, HEART and other calculatorsREFRESH  Note before signing :1}                          Medical Decision Making Amount and/or Complexity of Data Reviewed Labs: ordered.   This patient complains of ***; this involves an extensive number of treatment Options and is a complaint that carries with it a high risk of complications and morbidity. The differential includes ***  I ordered, reviewed and interpreted labs, which included *** I ordered medication *** and reviewed PMP when indicated. I ordered imaging studies which included *** and I independently    visualized and interpreted imaging which showed *** Additional history obtained from *** Previous records obtained and reviewed ***  I consulted *** and discussed lab and imaging findings and discussed disposition.  Cardiac monitoring reviewed, *** Social determinants considered, *** Critical Interventions: ***  After the interventions stated above, I reevaluated the patient and found *** Admission and further testing considered, ***   {Document critical care time when appropriate:1} {Document review of labs and clinical decision tools ie heart score, Chads2Vasc2 etc:1}  {Document your independent review of radiology images, and any outside records:1} {Document your discussion with family members, caretakers, and with consultants:1} {Document social determinants of health affecting pt's care:1} {Document your decision making why or why not admission, treatments were needed:1} Final Clinical Impression(s) / ED Diagnoses Final diagnoses:  None    Rx / DC Orders ED Discharge Orders     None

## 2022-08-16 NOTE — ED Provider Notes (Signed)
[facility]    CSN: [id number] Arrival date & time: 08/16/22  1503      History   Chief Complaint Chief Complaint  Patient presents with   Chest Pain    HPI Jacqueline [name] is a 62 y.o. female.   HPI  She is in today with a family member for chest neck and bilateral arm pain. She  has a past medical history of Anemia, Arthritis, GERD (gastroesophageal reflux disease), Hyperlipidemia, Hypertension, numbness and tingling in right hand, and Pre-diabetes. She reports that the neck pain has been ongoing for some time and she is aware that she has arthritis in her neck. However on last night she experienced the jaw and chest pain. She came here today for an evaluation. Her vital signs are stable however we are limited within UC to properly evaluate the additional symptoms. Her EKG has changed from her 01/2020 EKG but is similar to the 02/21 EKG . She deneis any nauesa or vomiting. She endorses OSA and has not been able to use the CPAP due to intolerance of the mask. She feels her sleep is worse trying to use  CPAP.  Past Medical History:  Diagnosis Date   Anemia    Arthritis    per patient, back   Complication of anesthesia 2018   per patient HR dropped during partial hysterectomy surgery   Fibroid uterus    GERD (gastroesophageal reflux disease)    H/O total hysterectomy    Hyperlipidemia    Borderline   Hypertension    Numbness and tingling in right hand    Pre-diabetes    borderline    Patient Active Problem List   Diagnosis Date Noted   Need for immunization against influenza 02/28/2022   Screening for cervical cancer 12/21/2021   H. pylori infection 12/05/2021   Snoring 11/23/2021   Insomnia 11/23/2021   Annual physical exam 10/16/2021   Dermatitis 10/16/2021   Need for varicella vaccine 10/16/2021   Diarrhea 07/05/2021   Stomach cramps 07/05/2021   Nausea & vomiting 07/05/2021   Encounter for vaccination 07/05/2021   Right foot pain 05/16/2021    Dysphagia 05/01/2021   Right shoulder pain 12/27/2020   Rash 12/13/2020   Chronic back pain 04/04/2020   History of back surgery 04/04/2020   Radiculopathy 07/28/2019   Borderline diabetes 12/25/2018   Hypertension 06/30/2018   S/P abdominal supracervical subtotal hysterectomy 11/06/2016   Trigger middle finger of right hand 04/24/2016   GERD (gastroesophageal reflux disease) 08/04/2014   Shoulder strain 07/26/2014   Degenerative arthritis of thumb 07/26/2014   Left knee pain 02/28/2014   Hip pain 04/15/2013   Epicondylitis, lateral (tennis elbow) 04/13/2013   Constipation 09/15/2012   Hyperlipidemia 11/28/2011   Obesity, Class I, BMI 30.0-34.9 (see actual BMI) 09/15/2011   ALLERGIC RHINITIS, SEASONAL 11/13/2007    Past Surgical History:  Procedure Laterality Date   BACK SURGERY     BALLOON DILATION N/A 05/22/2021   Procedure: BALLOON DILATION;  Surgeon: Eloise Harman, DO;  Location: [facility];  Service: Endoscopy;  Laterality: N/A;   CHOLECYSTECTOMY     COLONOSCOPY  2003   Dr. Gala Romney: Anal canal hemorrhoids   COLONOSCOPY N/A 08/25/2014   Normal colonoscopy.  Next colonoscopy in 2026.  Procedure: COLONOSCOPY;  Surgeon: Daneil Dolin, MD;  Location: [facility];  Service: Endoscopy;  Laterality: N/A;  1200   ESOPHAGOGASTRODUODENOSCOPY N/A 08/25/2014   Procedure: ESOPHAGOGASTRODUODENOSCOPY (EGD);  Surgeon: Daneil Dolin, MD;  Location:  [facility];  Service: Endoscopy;  Laterality: N/A;   ESOPHAGOGASTRODUODENOSCOPY (EGD) WITH PROPOFOL N/A 05/22/2021   Web and distal esophagus, dilated, gastriti, biopsy positive for mildly active H. pylori gastritis.  s procedure: ESOPHAGOGASTRODUODENOSCOPY (EGD) WITH PROPOFOL;  Surgeon: Eloise Harman, DO;  Location: [facility];  Service: Endoscopy;  Laterality: N/A;  9:15am   SALPINGOOPHORECTOMY Bilateral 11/06/2016   Procedure: BILATERAL SALPINGO OOPHORECTOMY;  Surgeon: Florian Buff, MD;  Location: [facility];  Service:  Gynecology;  Laterality: Bilateral;   SUPRACERVICAL ABDOMINAL HYSTERECTOMY N/A 11/06/2016   Procedure: HYSTERECTOMY SUPRACERVICAL ABDOMINAL;  Surgeon: Florian Buff, MD;  Location: [facility];  Service: Gynecology;  Laterality: N/A;   TRANSFORAMINAL LUMBAR INTERBODY FUSION (TLIF) WITH PEDICLE SCREW FIXATION 2 LEVEL Left 07/28/2019   Procedure: LEFT-SIDED LUMBAR 4- 5, LUMBAR 5 -SACRUM1 TRANSFORAMINAL LUMBAR INTERBODY FUSION WITH INSTRUMENTATION AND ALLOGRAFT;  Surgeon: Phylliss Bob, MD;  Location: [place];  Service: Orthopedics;  Laterality: Left;   TUBAL LIGATION      OB History     Gravida  3   Para  3   Term      Preterm      AB      Living  3      SAB      IAB      Ectopic      Multiple      Live Births               Home Medications    Prior to Admission medications   Medication Sig Start Date End Date Taking? Authorizing Provider  acyclovir (ZOVIRAX) 400 MG tablet TAKE 1 TABLET (400 MG TOTAL) BY MOUTH DAILY AS NEEDED (OUTBREAKS) 04/01/22   Alvira Monday, FNP  amLODipine (NORVASC) 5 MG tablet Take 1 tablet by mouth once daily 08/06/22   Alvira Monday, FNP  atorvastatin (LIPITOR) 20 MG tablet Take 1 tablet by mouth once daily 08/06/22   Alvira Monday, FNP  diclofenac (VOLTAREN) 75 MG EC tablet Take 1 tablet (75 mg total) by mouth 2 (two) times daily with a meal. 05/21/22   Sanjuana Kava, MD  estradiol (ESTRACE) 0.1 MG/GM vaginal cream INSERT 2 GRAMS VAGINALLY AS DIRECTED EVERY OTHER NIGHT 04/10/22   Florian Buff, MD  fluticasone (FLONASE) 50 MCG/ACT nasal spray Place 1 spray into both nostrils daily. 07/02/22   Eulogio Bear, NP  methocarbamol (ROBAXIN) 500 MG tablet TAKE 1 TABLET BY MOUTH EVERY 6 HOURS AS NEEDED FOR CHRONIC BACK PAIN 03/15/22   Alvira Monday, FNP  Multiple Vitamin (MULTIVITAMIN WITH MINERALS) TABS tablet Take 1 tablet by mouth daily with supper. One-A-Day Multivitamin    [provider]  pantoprazole (PROTONIX) 40 MG tablet  Take 1 tablet (40 mg total) by mouth 2 (two) times daily before a meal. 03/26/22   Alvira Monday, FNP  traZODone (DESYREL) 50 MG tablet Take 0.5-1 tablets (25-50 mg total) by mouth at bedtime as needed for sleep. Patient not taking: Reported on 07/02/2022 03/22/22   Alvira Monday, FNP    Family History Family History  Problem Relation Age of Onset   Breast cancer Maternal Aunt    Hypertension Maternal Aunt    Cancer Maternal Aunt        Breast   Cancer Maternal Grandmother         lymphnodes   Colon cancer Neg Hx     Social History Social History   Tobacco Use   Smoking status: Never   Smokeless tobacco: Never  Tobacco comments:    Never smoked  Vaping Use   Vaping Use: Never used  Substance Use Topics   Alcohol use: No    Alcohol/week: 0.0 standard drinks of alcohol   Drug use: No     Allergies   Patient has no known allergies.   Review of Systems Review of Systems   Physical Exam Triage Vital Signs ED Triage Vitals  Enc Vitals Group     BP 08/16/22 1516 114/74     Pulse Rate 08/16/22 1516 63     Resp 08/16/22 1516 18     Temp 08/16/22 1516 97.8 F (36.6 C)     Temp Source 08/16/22 1516 Oral     SpO2 08/16/22 1516 98 %     Weight --      Height --      Head Circumference --      Peak Flow --      Pain Score 08/16/22 1513 8     Pain Loc --      Pain Edu? --      Excl. in [place]? --    No data found.  Updated Vital Signs BP 114/74 (BP Location: Right Arm)   Pulse 63   Temp 97.8 F (36.6 C) (Oral)   Resp 18   LMP 10/26/2016 (Exact Date)   SpO2 98%   Visual Acuity Right Eye Distance:   Left Eye Distance:   Bilateral Distance:    Right Eye Near:   Left Eye Near:    Bilateral Near:     Physical Exam HENT:     Head: Normocephalic and atraumatic.  Eyes:     Extraocular Movements: Extraocular movements intact.     Pupils: Pupils are equal, round, and reactive to light.  Cardiovascular:     Rate and Rhythm: Normal rate.     Heart  sounds: Normal heart sounds.  Pulmonary:     Effort: Pulmonary effort is normal.     Breath sounds: Normal breath sounds. No decreased breath sounds.  Musculoskeletal:     Cervical back: Normal range of motion.  Neurological:     Mental Status: She is alert.     UC Treatments / Results  Labs (all labs ordered are listed, but only abnormal results are displayed) Labs Reviewed - No data to display  EKG   Radiology No results found.  Procedures Procedures (including critical care time)  Medications Ordered in UC Medications - No data to display  Initial Impression / Assessment and Plan / UC Course  I have reviewed the triage vital signs and the nursing notes.  Pertinent labs & imaging results that were available during my care of the patient were reviewed by me and considered in my medical decision making (see chart for details).     Chest and jaw pain  Final Clinical Impressions(s) / UC Diagnoses   Final diagnoses:  Chest pain radiating to jaw     Discharge Instructions      Due to your chest and jaw pain that involves the left and right arm you need to be evaluated at the ED with further evaluation. .       ED Prescriptions   None    PDMP not reviewed this encounter.   Dionisio David [place], [place] 08/16/22 [phone]

## 2022-08-16 NOTE — ED Triage Notes (Signed)
Pt via POV reporting pain to left jaw and neck, radiating to bilateral arms with tingling in fingertips. No SOB/chest pain, diaphoresis, nausea. She was seen at UC earlier today and they recommended she come here for blood work.

## 2022-08-16 NOTE — ED Triage Notes (Signed)
Pt reports she has some pain on the left side of her jaw that radiates down to her left arm since left last night. Pt says she has arthritis in her neck on the left side. Left side chest , arm, and neck are bothering her.  Right arm is tingling as well.

## 2022-08-16 NOTE — ED Notes (Signed)
Patient is being discharged from the Urgent Care and sent to the Emergency Department via private vehicle . Per NP, patient is in need of higher level of care due to Chest pain, jaw pain and left arm pain. Patient is aware and verbalizes understanding of plan of care.  Vitals:   08/16/22 1516  BP: 114/74  Pulse: 63  Resp: 18  Temp: 97.8 F (36.6 C)  SpO2: 98%

## 2022-08-16 NOTE — Discharge Instructions (Signed)
Due to your chest and jaw pain that involves the left and right arm you need to be evaluated at the ED with further evaluation. Marland Kitchen

## 2022-08-16 NOTE — Telephone Encounter (Signed)
error 

## 2022-08-19 ENCOUNTER — Telehealth: Payer: Self-pay

## 2022-08-19 NOTE — Transitions of Care (Post Inpatient/ED Visit) (Signed)
   08/19/2022  Name: LONDA MACKOWSKI MRN: [id number] DOB: 08/12/1960  Today's TOC FU Call Status: Today's TOC FU Call Status:: Successful TOC FU Call Competed TOC FU Call Complete Date: 08/19/22  Transition Care Management Follow-up Telephone Call Date of Discharge: 08/16/22 [facility] Type of Discharge: Emergency Department Reason for ED Visit: Cardiac Conditions Cardiac Conditions Diagnosis:  (Nonspecific chest pain) How have you been since you were released from the hospital?: Better Any questions or concerns?: No  Items Reviewed: Did you receive and understand the discharge instructions provided?: Yes Medications obtained and verified?: Yes (Medications Reviewed) Any new allergies since your discharge?: No Dietary orders reviewed?: Yes Do you have support at home?: Yes  Home Care and Equipment/Supplies: [place] Ordered?: No Any new equipment or medical supplies ordered?: No  Functional Questionnaire: Do you need assistance with bathing/showering or dressing?: No Do you need assistance with meal preparation?: No Do you need assistance with eating?: No Do you have difficulty maintaining continence: No Do you need assistance with getting out of bed/getting out of a chair/moving?: No Do you have difficulty managing or taking your medications?: No  Folllow up appointments reviewed: PCP Follow-up appointment confirmed?: Yes Date of PCP follow-up appointment?: 08/27/22 Follow-up Provider: Alvira Monday [facility] Follow-up appointment confirmed?: No Do you need transportation to your follow-up appointment?: No Do you understand care options if your condition(s) worsen?: Yes-patient verbalized understanding    Brock LPN [place] Direct Dial [phone]

## 2022-08-27 ENCOUNTER — Encounter: Payer: Self-pay | Admitting: Family Medicine

## 2022-08-27 ENCOUNTER — Ambulatory Visit (INDEPENDENT_AMBULATORY_CARE_PROVIDER_SITE_OTHER): Payer: Medicaid Other | Admitting: Family Medicine

## 2022-08-27 VITALS — BP 132/81 | HR 71 | Ht 62.5 in | Wt 181.1 lb

## 2022-08-27 DIAGNOSIS — G8929 Other chronic pain: Secondary | ICD-10-CM

## 2022-08-27 DIAGNOSIS — E559 Vitamin D deficiency, unspecified: Secondary | ICD-10-CM

## 2022-08-27 DIAGNOSIS — M542 Cervicalgia: Secondary | ICD-10-CM | POA: Diagnosis not present

## 2022-08-27 DIAGNOSIS — E7849 Other hyperlipidemia: Secondary | ICD-10-CM | POA: Diagnosis not present

## 2022-08-27 DIAGNOSIS — R7301 Impaired fasting glucose: Secondary | ICD-10-CM | POA: Diagnosis not present

## 2022-08-27 DIAGNOSIS — G4730 Sleep apnea, unspecified: Secondary | ICD-10-CM

## 2022-08-27 DIAGNOSIS — E038 Other specified hypothyroidism: Secondary | ICD-10-CM

## 2022-08-27 NOTE — Progress Notes (Unsigned)
Established Patient Office Visit  Subjective:  Patient ID: Jacqueline [name], female    DOB: 02-13-1961  Age: 62 y.o. MRN: [id number]  CC:  Chief Complaint  Patient presents with   Follow-up    Follow up from 08/16/22 ed visit. Pt reports still having neck pain. Also reports still having sleep apnea problems, tried using cpap machine didn't work out for her.     HPI Jacqueline [name] is a 62 y.o. female with past medical history of hypertension, GERD, dysphagia, nausea vomiting and chronic back pain presents for f/u of  chronic medical conditions. For the details of today's visit, please refer to the assessment and plan.     Past Medical History:  Diagnosis Date   Anemia    Arthritis    per patient, back   Complication of anesthesia 2018   per patient HR dropped during partial hysterectomy surgery   Fibroid uterus    GERD (gastroesophageal reflux disease)    H/O total hysterectomy    Hyperlipidemia    Borderline   Hypertension    Numbness and tingling in right hand    Pre-diabetes    borderline    Past Surgical History:  Procedure Laterality Date   BACK SURGERY     BALLOON DILATION N/A 05/22/2021   Procedure: BALLOON DILATION;  Surgeon: Eloise Harman, DO;  [facility]: [facility];  Service: Endoscopy;  Laterality: N/A;   CHOLECYSTECTOMY     COLONOSCOPY  2003   Dr. Gala Romney: Anal canal hemorrhoids   COLONOSCOPY N/A 08/25/2014   Normal colonoscopy.  Next colonoscopy in 2026.  Procedure: COLONOSCOPY;  Surgeon: Daneil Dolin, MD;  Location: [facility];  Service: Endoscopy;  Laterality: N/A;  1200   ESOPHAGOGASTRODUODENOSCOPY N/A 08/25/2014   Procedure: ESOPHAGOGASTRODUODENOSCOPY (EGD);  Surgeon: Daneil Dolin, MD;  Location: [facility];  Service: Endoscopy;  Laterality: N/A;   ESOPHAGOGASTRODUODENOSCOPY (EGD) WITH PROPOFOL N/A 05/22/2021   Web and distal esophagus, dilated, gastriti, biopsy positive for mildly active H. pylori gastritis.  s procedure:  ESOPHAGOGASTRODUODENOSCOPY (EGD) WITH PROPOFOL;  Surgeon: Eloise Harman, DO;  Location: [facility];  Service: Endoscopy;  Laterality: N/A;  9:15am   SALPINGOOPHORECTOMY Bilateral 11/06/2016   Procedure: BILATERAL SALPINGO OOPHORECTOMY;  Surgeon: Florian Buff, MD;  Location: [facility];  Service: Gynecology;  Laterality: Bilateral;   SUPRACERVICAL ABDOMINAL HYSTERECTOMY N/A 11/06/2016   Procedure: HYSTERECTOMY SUPRACERVICAL ABDOMINAL;  Surgeon: Florian Buff, MD;  Location: [facility];  Service: Gynecology;  Laterality: N/A;   TRANSFORAMINAL LUMBAR INTERBODY FUSION (TLIF) WITH PEDICLE SCREW FIXATION 2 LEVEL Left 07/28/2019   Procedure: LEFT-SIDED LUMBAR 4- 5, LUMBAR 5 -SACRUM1 TRANSFORAMINAL LUMBAR INTERBODY FUSION WITH INSTRUMENTATION AND ALLOGRAFT;  Surgeon: Phylliss Bob, MD;  Location: [place];  Service: Orthopedics;  Laterality: Left;   TUBAL LIGATION      Family History  Problem Relation Age of Onset   Breast cancer Maternal Aunt    Hypertension Maternal Aunt    Cancer Maternal Aunt        Breast   Cancer Maternal Grandmother         lymphnodes   Colon cancer Neg Hx     Social History   Socioeconomic History   Marital status: Married    Spouse name: Not on file   Number of children: 3   Years of education: Not on file   Highest education level: Not on file  Occupational History   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  Smoking status: Never   Smokeless tobacco: Never   Tobacco comments:    Never smoked  Vaping Use   Vaping Use: Never used  Substance and Sexual Activity   Alcohol use: No    Alcohol/week: 0.0 standard drinks of alcohol   Drug use: No   Sexual activity: Yes    Birth control/protection: Post-menopausal, Surgical  Other Topics Concern   Not on file  Social History Narrative   Lives with her spouse, work part at [employer]    Social Determinants of Health   Financial Resource Strain: Not on file  Food Insecurity: Not on file  Transportation  Needs: Not on file  Physical Activity: Not on file  Stress: Not on file  Social Connections: Not on file  Intimate Partner Violence: Not on file    Outpatient Medications Prior to Visit  Medication Sig Dispense Refill   acyclovir (ZOVIRAX) 400 MG tablet Take 400 mg by mouth as needed.     amLODipine (NORVASC) 5 MG tablet Take 1 tablet by mouth once daily 90 tablet 0   atorvastatin (LIPITOR) 20 MG tablet Take 1 tablet by mouth once daily 90 tablet 0   diclofenac (VOLTAREN) 75 MG EC tablet Take 1 tablet (75 mg total) by mouth 2 (two) times daily with a meal. 60 tablet 2   estradiol (ESTRACE) 0.1 MG/GM vaginal cream INSERT 2 GRAMS VAGINALLY AS DIRECTED EVERY OTHER NIGHT 43 g 11   fluticasone (FLONASE) 50 MCG/ACT nasal spray Place 1 spray into both nostrils daily. 16 g 6   methocarbamol (ROBAXIN) 500 MG tablet TAKE 1 TABLET BY MOUTH EVERY 6 HOURS AS NEEDED FOR CHRONIC BACK PAIN 30 tablet 2   Multiple Vitamin (MULTIVITAMIN WITH MINERALS) TABS tablet Take 1 tablet by mouth daily with supper. One-A-Day Multivitamin     pantoprazole (PROTONIX) 40 MG tablet Take 1 tablet (40 mg total) by mouth 2 (two) times daily before a meal. 180 tablet 0   traZODone (DESYREL) 50 MG tablet Take 0.5-1 tablets (25-50 mg total) by mouth at bedtime as needed for sleep. 30 tablet 3   acyclovir (ZOVIRAX) 400 MG tablet TAKE 1 TABLET (400 MG TOTAL) BY MOUTH DAILY AS NEEDED (OUTBREAKS) 90 tablet 1   No facility-administered medications prior to visit.    No Known Allergies  ROS Review of Systems  Constitutional:  Negative for chills and fever.  Eyes:  Negative for visual disturbance.  Respiratory:  Negative for chest tightness and shortness of breath.   Musculoskeletal:  Positive for neck pain.  Neurological:  Negative for dizziness and headaches.      Objective:    Physical Exam HENT:     Head: Normocephalic.     Mouth/Throat:     Mouth: Mucous membranes are moist.  Cardiovascular:     Rate and Rhythm:  Normal rate.     Heart sounds: Normal heart sounds.  Pulmonary:     Effort: Pulmonary effort is normal.     Breath sounds: Normal breath sounds.  Musculoskeletal:     Cervical back: No edema or crepitus. Muscular tenderness (Posterior and left lateral) present.  Neurological:     Mental Status: She is alert.     BP 132/81   Pulse 71   Ht 5' 2.5" (1.588 m)   Wt 181 lb 1.9 oz (82.2 kg)   LMP 10/26/2016 (Exact Date)   SpO2 94%   BMI 32.60 kg/m  Wt Readings from Last 3 Encounters:  08/27/22 181 lb 1.9 oz (82.2  kg)  08/16/22 183 lb (83 kg)  06/13/22 179 lb 9.6 oz (81.5 kg)    Lab Results  Component Value Date   TSH 0.686 10/17/2021   Lab Results  Component Value Date   WBC 5.0 08/16/2022   HGB 12.5 08/16/2022   HCT 37.4 08/16/2022   MCV 91.7 08/16/2022   PLT 248 08/16/2022   Lab Results  Component Value Date   NA 133 (L) 08/16/2022   K 3.5 08/16/2022   CO2 25 08/16/2022   GLUCOSE 97 08/16/2022   BUN 15 08/16/2022   CREATININE 0.92 08/16/2022   BILITOT 0.8 10/17/2021   ALKPHOS 96 10/17/2021   AST 23 10/17/2021   ALT 15 10/17/2021   PROT 7.2 10/17/2021   ALBUMIN 4.2 10/17/2021   CALCIUM 8.9 08/16/2022   ANIONGAP 6 08/16/2022   EGFR 65 10/17/2021   Lab Results  Component Value Date   CHOL 150 10/17/2021   Lab Results  Component Value Date   HDL 56 10/17/2021   Lab Results  Component Value Date   LDLCALC 79 10/17/2021   Lab Results  Component Value Date   TRIG 81 10/17/2021   Lab Results  Component Value Date   CHOLHDL 2.7 10/17/2021   Lab Results  Component Value Date   HGBA1C 5.9 (H) 10/17/2021      Assessment & Plan:  Neck pain, chronic Assessment & Plan: She was following up with orthopedics and was scheduled for MRI of the cervical spine however, the patient lost her insurance and has not followed up since She had imaging studies done on 05/21/2022, which showed degenerative changes in the cervical spine Today, the patient complains  of posterior and left lateral neck pain that radiates down her left shoulders No red flag symptoms reported Encouraged the patient to continue taking Voltaren 75 mg twice daily and Robaxin 500 mg every 6 hours as needed Encouraged heat application to the affected site Encouraged to follow up with orthopedics once her medical insurance is reinstated Patient verbalized understanding   Sleep apnea, unspecified type Assessment & Plan: Reports minimal relief of her symptoms with the CPAP machine She would like to explore alternative treatments Given that the patient does not have medical insurance currently,we will discuss alternative treatments once her insurance is reinstated for coverage Encouraged heart-healthy diet with increased physical activity to maintain a healthy BMI    IFG (impaired fasting glucose) -     Hemoglobin A1c  Vitamin D deficiency -     VITAMIN D 25 Hydroxy (Vit-D Deficiency, Fractures)  Other specified hypothyroidism -     TSH + free T4  Other hyperlipidemia -     Lipid panel -     CMP14+EGFR -     CBC with Differential/Platelet    Follow-up: Return in about 3 months (around 11/27/2022).   Alvira Monday, FNP

## 2022-08-27 NOTE — Patient Instructions (Addendum)
I appreciate the opportunity to provide care to you today!    Follow up:  3 months  Labs: please stop by the lab today to get blood drawn (CBC, CMP, TSH, Lipid profile, HgA1c, Vit D)   Neck pain I recommended she continue taking Voltaren 75 mg daily and Robaxin 500 mg for your neck pain I also recommend heat application to the affected site  Foot pain I recommend getting shoe insoles to provide support I recommend avoiding activities that aggravate your symptoms I recommend follow-up with your podiatrist as soon as possible  CPAP We will reconvene on CPAP alternatives once you have your insurance I recommend lifestyle modification with a heart healthy diet and increased physical activity to maintain a healthy BMI     Please continue to a heart-healthy diet and increase your physical activities. Try to exercise for 66mins at least five days a week.      It was a pleasure to see you and I look forward to continuing to work together on your health and well-being. Please do not hesitate to call the office if you need care or have questions about your care.   Have a wonderful day and week. With Gratitude, Alvira Monday MSN, FNP-BC

## 2022-08-28 DIAGNOSIS — G8929 Other chronic pain: Secondary | ICD-10-CM | POA: Insufficient documentation

## 2022-08-28 DIAGNOSIS — G4730 Sleep apnea, unspecified: Secondary | ICD-10-CM | POA: Insufficient documentation

## 2022-08-28 NOTE — Assessment & Plan Note (Signed)
Reports minimal relief of her symptoms with the CPAP machine She would like to explore alternative treatments Given that the patient does not have medical insurance currently,we will discuss alternative treatments once her insurance is reinstated for coverage Encouraged heart-healthy diet with increased physical activity to maintain a healthy BMI

## 2022-08-28 NOTE — Assessment & Plan Note (Signed)
She was following up with orthopedics and was scheduled for MRI of the cervical spine however, the patient lost her insurance and has not followed up since She had imaging studies done on 05/21/2022, which showed degenerative changes in the cervical spine Today, the patient complains of posterior and left lateral neck pain that radiates down her left shoulders No red flag symptoms reported Encouraged the patient to continue taking Voltaren 75 mg twice daily and Robaxin 500 mg every 6 hours as needed Encouraged heat application to the affected site Encouraged to follow up with orthopedics once her medical insurance is reinstated Patient verbalized understanding

## 2022-09-06 ENCOUNTER — Telehealth: Payer: Self-pay | Admitting: Orthopaedic Surgery

## 2022-09-06 DIAGNOSIS — E559 Vitamin D deficiency, unspecified: Secondary | ICD-10-CM | POA: Diagnosis not present

## 2022-09-06 DIAGNOSIS — R7301 Impaired fasting glucose: Secondary | ICD-10-CM | POA: Diagnosis not present

## 2022-09-06 DIAGNOSIS — E038 Other specified hypothyroidism: Secondary | ICD-10-CM | POA: Diagnosis not present

## 2022-09-06 DIAGNOSIS — E7849 Other hyperlipidemia: Secondary | ICD-10-CM | POA: Diagnosis not present

## 2022-09-07 LAB — CBC WITH DIFFERENTIAL/PLATELET
Basophils Absolute: 0 10*3/uL (ref 0.0–0.2)
Basos: 1 %
EOS (ABSOLUTE): 0.4 10*3/uL (ref 0.0–0.4)
Eos: 9 %
Hematocrit: 36.3 % (ref 34.0–46.6)
Hemoglobin: 12.3 g/dL (ref 11.1–15.9)
Immature Grans (Abs): 0 10*3/uL (ref 0.0–0.1)
Immature Granulocytes: 0 %
Lymphocytes Absolute: 1.7 10*3/uL (ref 0.7–3.1)
Lymphs: 36 %
MCH: 30.4 pg (ref 26.6–33.0)
MCHC: 33.9 g/dL (ref 31.5–35.7)
MCV: 90 fL (ref 79–97)
Monocytes Absolute: 0.4 10*3/uL (ref 0.1–0.9)
Monocytes: 10 %
Neutrophils Absolute: 2.1 10*3/uL (ref 1.4–7.0)
Neutrophils: 44 %
Platelets: 217 10*3/uL (ref 150–450)
RBC: 4.04 x10E6/uL (ref 3.77–5.28)
RDW: 13.2 % (ref 11.7–15.4)
WBC: 4.7 10*3/uL (ref 3.4–10.8)

## 2022-09-07 LAB — CMP14+EGFR
ALT: 47 IU/L — ABNORMAL HIGH (ref 0–32)
AST: 40 IU/L (ref 0–40)
Albumin/Globulin Ratio: 1.4 (ref 1.2–2.2)
Albumin: 4.3 g/dL (ref 3.8–4.9)
Alkaline Phosphatase: 112 IU/L (ref 44–121)
BUN/Creatinine Ratio: 14 (ref 12–28)
BUN: 15 mg/dL (ref 8–27)
Bilirubin Total: 0.9 mg/dL (ref 0.0–1.2)
CO2: 23 mmol/L (ref 20–29)
Calcium: 9.5 mg/dL (ref 8.7–10.3)
Chloride: 102 mmol/L (ref 96–106)
Creatinine, Ser: 1.06 mg/dL — ABNORMAL HIGH (ref 0.57–1.00)
Globulin, Total: 3 g/dL (ref 1.5–4.5)
Glucose: 90 mg/dL (ref 70–99)
Potassium: 4.1 mmol/L (ref 3.5–5.2)
Sodium: 140 mmol/L (ref 134–144)
Total Protein: 7.3 g/dL (ref 6.0–8.5)
eGFR: 60 mL/min/{1.73_m2} (ref >59)

## 2022-09-07 LAB — LIPID PANEL
Chol/HDL Ratio: 3.1 ratio (ref 0.0–4.4)
Cholesterol, Total: 170 mg/dL (ref 100–199)
HDL: 54 mg/dL (ref >39)
LDL Chol Calc (NIH): 102 mg/dL — ABNORMAL HIGH (ref 0–99)
Triglycerides: 75 mg/dL (ref 0–149)
VLDL Cholesterol Cal: 14 mg/dL (ref 5–40)

## 2022-09-07 LAB — VITAMIN D 25 HYDROXY (VIT D DEFICIENCY, FRACTURES): Vit D, 25-Hydroxy: 60.8 ng/mL (ref 30.0–100.0)

## 2022-09-07 LAB — TSH+FREE T4
Free T4: 1.72 ng/dL (ref 0.82–1.77)
TSH: 1.22 u[IU]/mL (ref 0.450–4.500)

## 2022-09-07 LAB — HEMOGLOBIN A1C
Est. average glucose Bld gHb Est-mCnc: 123 mg/dL
Hgb A1c MFr Bld: 5.9 % — ABNORMAL HIGH (ref 4.8–5.6)

## 2022-09-08 ENCOUNTER — Other Ambulatory Visit: Payer: Self-pay | Admitting: Family Medicine

## 2022-09-08 NOTE — Progress Notes (Signed)
Encouraged the patient to continue taking rosuvastatin 20 mg daily.  Her hemoglobin A1c is 5.9, this indicates that she is prediabetic. I recommend avoiding simple carbohydrates, including cakes, sweet desserts, ice cream, soda (diet or regular), sweet tea, candies, chips, cookies, store-bought juices, alcohol in excess of 1-2 drinks a day, lemonade, artificial sweeteners, donuts, coffee creamers, and sugar-free products.  I recommend avoiding greasy, fatty foods with increased physical activity.  All other labs are stable

## 2022-09-08 NOTE — Progress Notes (Signed)
The 10-year ASCVD risk score (Arnett DK, et al., 2019) is: 6.5%   Values used to calculate the score:     Age: 62 years     Sex: Female     Is Non-Hispanic African American: Yes     Diabetic: No     Tobacco smoker: No     Systolic Blood Pressure: [id number] mmHg     Is BP treated: Yes     HDL Cholesterol: 54 mg/dL     Total Cholesterol: 170 mg/dL

## 2022-09-09 ENCOUNTER — Ambulatory Visit (INDEPENDENT_AMBULATORY_CARE_PROVIDER_SITE_OTHER): Payer: Medicaid Other | Admitting: Internal Medicine

## 2022-09-09 ENCOUNTER — Encounter: Payer: Self-pay | Admitting: Internal Medicine

## 2022-09-09 VITALS — BP 124/71 | HR 73 | Resp 16 | Ht 62.5 in | Wt 181.0 lb

## 2022-09-09 DIAGNOSIS — M5441 Lumbago with sciatica, right side: Secondary | ICD-10-CM

## 2022-09-09 DIAGNOSIS — G8929 Other chronic pain: Secondary | ICD-10-CM

## 2022-09-09 MED ORDER — SENNA-DOCUSATE SODIUM 8.6-50 MG PO TABS: 1.0000 | ORAL_TABLET | Freq: Every day | ORAL | 0 refills | Status: DC | PRN

## 2022-09-09 MED ORDER — DULOXETINE HCL 30 MG PO CPEP: 30.0000 mg | ORAL_CAPSULE | Freq: Every day | ORAL | 0 refills | Status: DC

## 2022-09-09 NOTE — Progress Notes (Signed)
   HPI:JacquelineYAQUELYN [name] is a 62 y.o. female wityh history of L4-L5 and L5-S1 fusion  and DDD of cervical spine who presents for evaluation of  low back pain and neck pain. Symptoms of low back pain have been present since 2017 and include pain in lower back (sharp in character; 8/10 in severity). Initial back pain started after a fall at work. She had fusion in 2021. Symptoms are worst in the afternoon. Alleviating factors identifiable by patient are use of medications tramadol, muscle relaxer, and mobic. Exacerbating factors identifiable by patient are bending backwards, bending forwards, and bending sideways. Treatments so far initiated by patient:  physical therapy, medications and surgery  . Since last week patient has exacerbated pain and feels pins and needles from right hip down the back of leg down to foot. Felt a little better Saturday but still not completely better. No loss of bowel , bladder, or focal weakness.   Vitals:   09/09/22 1033  BP: 124/71  Pulse: 73  Resp: 16  SpO2: 96%  Weight: 181 lb (82.1 kg)  Height: 5' 2.5" (1.588 m)     Physical Exam Constitutional:      General: She is not in acute distress.    Appearance: She is obese.     Comments: Appears uncomfortable with movement, sitting on edge of chari  Musculoskeletal:     Comments: No gross deformity, scoliosis. TTP on right side.  No midline or bony TTP. Strength LEs 5/5 all muscle groups.   2+ MSRs in patellar and achilles tendons, equal bilaterally. Patient unable to lay flat for SLR Sensation intact to light touch bilaterally.    Neurological:     General: No focal deficit present.     Sensory: No sensory deficit.     Motor: No weakness.      Assessment & Plan:   Labrenda was seen today for back pain.  Chronic bilateral low back pain with right-sided sciatica Assessment & Plan: Continue methocarbamol , Mobic , and tramadol PRN.  Take senna-docusate on days you take tramadol to help with  constipation. Start Cymbalta 30 mg.  Follow up if symptoms worsen or fail to improve   Orders: -     DULoxetine HCl; Take 1 capsule (30 mg total) by mouth daily.  Dispense: 90 capsule; Refill: 0 -     Senna-Docusate Sodium; Take 1 tablet by mouth daily as needed for constipation.  Dispense: 30 tablet; Refill: 0      Lorene Dy, MD

## 2022-09-09 NOTE — Patient Instructions (Addendum)
Thank you, Ms.Laurence Slate for allowing [place] to provide your care today.     Reminders: Continued current therapies prescribed for chronic back pain. Start Cymbalta 30 mg. Follow up sooner if symptoms worsen or fail to improve. If doing well , follow up in June with PCP.     Tamsen Snider, M.D.

## 2022-09-09 NOTE — Assessment & Plan Note (Addendum)
Continue methocarbamol , Mobic , and tramadol PRN.  Take senna-docusate on days you take tramadol to help with constipation. Start Cymbalta 30 mg.  Follow up if symptoms worsen or fail to improve

## 2022-09-17 ENCOUNTER — Encounter: Payer: Self-pay | Admitting: Orthopaedic Surgery

## 2022-09-17 ENCOUNTER — Ambulatory Visit (INDEPENDENT_AMBULATORY_CARE_PROVIDER_SITE_OTHER): Payer: Medicaid Other | Admitting: Orthopaedic Surgery

## 2022-09-17 VITALS — BP 120/70 | HR 62 | Ht 62.0 in | Wt 181.0 lb

## 2022-09-17 DIAGNOSIS — M542 Cervicalgia: Secondary | ICD-10-CM | POA: Diagnosis not present

## 2022-09-17 NOTE — Patient Instructions (Signed)
While we are working on your approval for MRI please go ahead and call to schedule your appointment with [facility] within at least one (1) week.  ° °[facility] [phone]  ° °

## 2022-09-17 NOTE — Progress Notes (Signed)
My neck is worse.  She has more pain in the neck.  She has radiation to the left side today where before it was more to the right shoulder.  She has pain to the left hand at times.  She has numbness. She has no new trauma. She is taking the diclofenac.  ROM of the neck is good, left upper trapezius pain is present, no spasm. ROM of both shoulders is good, NV intact.  Encounter Diagnosis  Name Primary?   Cervicalgia Yes   I will get MRI as she is not improved.  I am concerned about HNP.  Call if any problem.  Precautions discussed.  Electronically Signed Darreld Mclean, MD 4/9/20242:43 PM

## 2022-09-19 ENCOUNTER — Ambulatory Visit: Payer: Self-pay | Admitting: Orthopaedic Surgery

## 2022-09-23 ENCOUNTER — Encounter: Payer: Self-pay | Admitting: Orthopaedic Surgery

## 2022-09-26 ENCOUNTER — Telehealth: Payer: Self-pay | Admitting: Orthopaedic Surgery

## 2022-09-26 NOTE — Telephone Encounter (Signed)
Dr. Sanjuan Dame pt - pt lvm stating she wants to know what the insurance said about her MRI, stated she hasn't heard anything.

## 2022-09-27 ENCOUNTER — Other Ambulatory Visit: Payer: Self-pay

## 2022-09-27 DIAGNOSIS — K219 Gastro-esophageal reflux disease without esophagitis: Secondary | ICD-10-CM

## 2022-09-27 MED ORDER — PANTOPRAZOLE SODIUM 40 MG PO TBEC: 40.0000 mg | DELAYED_RELEASE_TABLET | Freq: Two times a day (BID) | ORAL | 0 refills | Status: DC

## 2022-09-27 NOTE — Telephone Encounter (Signed)
Returned patients call. No answer. Left generic vm asking for call back to discuss her MRI C spine. MRI has been denied due to patient not meeting requirements. Need to discuss with her.

## 2022-09-27 NOTE — Telephone Encounter (Signed)
Patient called and left a voicemail at 11:18 am states she was trying to get to the phone in time, but it hung up before she could answer it.  She was returning our call   I tried to call the patient back got a recording " call can not be completed at this time"

## 2022-09-27 NOTE — Telephone Encounter (Signed)
Dr. Sanjuan Dame patient - pt lvm wanting to make sure insurance is going to cover the MRI.  I see it has been denied.  Please call her back.

## 2022-09-27 NOTE — Telephone Encounter (Signed)
Spoke with patient regarding MRI. Let her know it had been denied but I would send Dr.Keeling a message asking him to addend the last office notes to reflect the patients home exercise program.

## 2022-09-30 ENCOUNTER — Other Ambulatory Visit: Payer: Self-pay

## 2022-09-30 DIAGNOSIS — G8929 Other chronic pain: Secondary | ICD-10-CM

## 2022-09-30 DIAGNOSIS — M542 Cervicalgia: Secondary | ICD-10-CM

## 2022-09-30 NOTE — Telephone Encounter (Signed)
Conversation with patient documented on previous telephone encounter

## 2022-10-01 ENCOUNTER — Telehealth: Payer: Self-pay | Admitting: Internal Medicine

## 2022-10-01 NOTE — Telephone Encounter (Signed)
Patient stopped by and brought new insurance card and it was scanned into [employer].  She said that she was supposed to be scheduled for an EGD.

## 2022-10-01 NOTE — Telephone Encounter (Signed)
LMOVM to return call.

## 2022-10-02 ENCOUNTER — Other Ambulatory Visit: Payer: Self-pay

## 2022-10-02 NOTE — Telephone Encounter (Signed)
Pt left vm returning call.   Attempted to call pt, no answer

## 2022-10-07 NOTE — Telephone Encounter (Signed)
Pt called lm returning call.

## 2022-10-09 DIAGNOSIS — Z419 Encounter for procedure for purposes other than remedying health state, unspecified: Secondary | ICD-10-CM | POA: Diagnosis not present

## 2022-10-15 ENCOUNTER — Ambulatory Visit: Payer: 59 | Admitting: Orthopaedic Surgery

## 2022-10-15 NOTE — Telephone Encounter (Signed)
LMTRC

## 2022-10-16 ENCOUNTER — Encounter: Payer: Self-pay | Admitting: *Deleted

## 2022-10-16 NOTE — Therapy (Addendum)
OUTPATIENT PHYSICAL THERAPY CERVICAL EVALUATION   Patient Name: Jacqueline [name] MRN: [id number] DOB:11-May-1961, 62 y.o., female Today's Date: 10/18/2022  END OF SESSION:  PT End of Session - 10/18/22 1430     Visit Number 1    Number of Visits 8    Date for PT Re-Evaluation 11/15/22    Authorization Type [employer]    PT Start Time 1431    PT Stop Time 1510    PT Time Calculation (min) 39 min    Activity Tolerance Patient tolerated treatment well    Behavior During Therapy WFL for tasks assessed/performed             Past Medical History:  Diagnosis Date   Anemia    Arthritis    per patient, back   Complication of anesthesia 2018   per patient HR dropped during partial hysterectomy surgery   Fibroid uterus    GERD (gastroesophageal reflux disease)    H/O total hysterectomy    Hyperlipidemia    Borderline   Hypertension    Numbness and tingling in right hand    Pre-diabetes    borderline   Past Surgical History:  Procedure Laterality Date   BACK SURGERY     BALLOON DILATION N/A 05/22/2021   Procedure: BALLOON DILATION;  Surgeon: Lanelle Bal, DO;  [facility];  Service: Endoscopy;  Laterality: N/A;   CHOLECYSTECTOMY     COLONOSCOPY  2003   Dr. Jena Gauss: Anal canal hemorrhoids   COLONOSCOPY N/A 08/25/2014   Normal colonoscopy.  Next colonoscopy in 2026.  Procedure: COLONOSCOPY;  Surgeon: Corbin Ade, MD;  [facility]: [facility];  Service: Endoscopy;  Laterality: N/A;  1200   ESOPHAGOGASTRODUODENOSCOPY N/A 08/25/2014   Procedure: ESOPHAGOGASTRODUODENOSCOPY (EGD);  Surgeon: Corbin Ade, MD;  [facility];  Service: Endoscopy;  Laterality: N/A;   ESOPHAGOGASTRODUODENOSCOPY (EGD) WITH PROPOFOL N/A 05/22/2021   Web and distal esophagus, dilated, gastriti, biopsy positive for mildly active H. pylori gastritis.  s procedure: ESOPHAGOGASTRODUODENOSCOPY (EGD) WITH PROPOFOL;  Surgeon: Lanelle Bal, DO;  [facility];   Service: Endoscopy;  Laterality: N/A;  9:15am   SALPINGOOPHORECTOMY Bilateral 11/06/2016   Procedure: BILATERAL SALPINGO OOPHORECTOMY;  Surgeon: Lazaro Arms, MD;  Location: [facility];  Service: Gynecology;  Laterality: Bilateral;   SUPRACERVICAL ABDOMINAL HYSTERECTOMY N/A 11/06/2016   Procedure: HYSTERECTOMY SUPRACERVICAL ABDOMINAL;  Surgeon: Lazaro Arms, MD;  Location: [facility];  Service: Gynecology;  Laterality: N/A;   TRANSFORAMINAL LUMBAR INTERBODY FUSION (TLIF) WITH PEDICLE SCREW FIXATION 2 LEVEL Left 07/28/2019   Procedure: LEFT-SIDED LUMBAR 4- 5, LUMBAR 5 -SACRUM1 TRANSFORAMINAL LUMBAR INTERBODY FUSION WITH INSTRUMENTATION AND ALLOGRAFT;  Surgeon: Estill Bamberg, MD;  Location: [facility];  Service: Orthopedics;  Laterality: Left;   TUBAL LIGATION     Patient Active Problem List   Diagnosis Date Noted   Neck pain, chronic 08/28/2022   Sleep apnea 08/28/2022   Need for immunization against influenza 02/28/2022   Screening for cervical cancer 12/21/2021   H. pylori infection 12/05/2021   Snoring 11/23/2021   Insomnia 11/23/2021   Annual physical exam 10/16/2021   Dermatitis 10/16/2021   Need for varicella vaccine 10/16/2021   Diarrhea 07/05/2021   Stomach cramps 07/05/2021   Nausea & vomiting 07/05/2021   Encounter for vaccination 07/05/2021   Right foot pain 05/16/2021   Dysphagia 05/01/2021   Right shoulder pain 12/27/2020   Rash 12/13/2020   Chronic back pain 04/04/2020   History of  back surgery 04/04/2020   Radiculopathy 07/28/2019   Borderline diabetes 12/25/2018   Hypertension 06/30/2018   S/P abdominal supracervical subtotal hysterectomy 11/06/2016   Trigger middle finger of right hand 04/24/2016   GERD (gastroesophageal reflux disease) 08/04/2014   Shoulder strain 07/26/2014   Degenerative arthritis of thumb 07/26/2014   Left knee pain 02/28/2014   Hip pain 04/15/2013   Epicondylitis, lateral (tennis elbow) 04/13/2013   Constipation 09/15/2012   Hyperlipidemia  11/28/2011   Obesity, Class I, BMI 30.0-34.9 (see actual BMI) 09/15/2011   ALLERGIC RHINITIS, SEASONAL 11/13/2007    PCP: Gilmore Laroche, FNP  REFERRING PROVIDER: Darreld Mclean, MD  REFERRING DIAG: M54.2 (ICD-10-CM) - Cervicalgia M54.2,G89.29 (ICD-10-CM) - Neck pain, chronic  THERAPY DIAG:  Neck pain  Rationale for Evaluation and Treatment: Rehabilitation  ONSET DATE: over a year  SUBJECTIVE:                                                                                                                                                                                                         SUBJECTIVE STATEMENT: Started having pain in both arms in 2019; shot in right shoulder; later 2023 returned to MD and found out arm pain coming from her neck; Md wants her to Have MRI but sent to PT first  Hand dominance: Right  PERTINENT HISTORY:  Having some lower extremity issues Back fusion in 2021  PAIN:  Are you having pain? Yes: NPRS scale: 0-9/10 Pain location: neck Pain description: hurting Aggravating factors: turning it the way, extension Relieving factors: massage, muscle relaxer  PRECAUTIONS: None  WEIGHT BEARING RESTRICTIONS: No  FALLS:  Has patient fallen in last 6 months? No  OCCUPATION: work part time as a Conservation officer, nature  PLOF: Independent  PATIENT GOALS: get it right  NEXT MD VISIT: 12/03/22 PCP  OBJECTIVE:   DIAGNOSTIC FINDINGS:  Clinical:  neck pain and left sided paresthesias to the hand on the left.   X-rays were done of the cervical spine, four views.   There is normal cervical lordosis present.  There is some narrowing and anterior osteophytes at C3-C4, C4-C5 and C5-C6.  No fracture is present.  Bone quality is good.   Impression:  some degenerative changes of the cervical spine as noted, no acute findings.   Electronically Signed Darreld Mclean, MD 12/12/20232:32 PM  PATIENT SURVEYS:  FOTO 48  COGNITION: Overall cognitive status: Within functional  limits for tasks assessed   POSTURE: rounded shoulders, forward head, and guarding  PALPATION: Tender bilateral upper traps and levator   CERVICAL ROM:   Active  ROM AROM (deg) eval  Flexion 12  Extension 14  Right lateral flexion 19  Left lateral flexion 20  Right rotation 31  Left rotation 36   (Blank rows = not tested)  UPPER EXTREMITY ROM:  Active ROM Right eval Left eval  Shoulder flexion ~100 deg ~90 deg*  Shoulder extension    Shoulder abduction    Shoulder adduction    Shoulder extension    Shoulder internal rotation    Shoulder external rotation    Elbow flexion    Elbow extension    Wrist flexion    Wrist extension    Wrist ulnar deviation    Wrist radial deviation    Wrist pronation    Wrist supination     (Blank rows = not tested)  UPPER EXTREMITY MMT:  MMT Right eval Left eval  Shoulder flexion 4 4  Shoulder extension    Shoulder abduction    Shoulder adduction    Shoulder extension    Shoulder internal rotation    Shoulder external rotation    Middle trapezius    Lower trapezius    Elbow flexion 4+ 4+  Elbow extension 4+ 4+  Wrist flexion    Wrist extension    Wrist ulnar deviation    Wrist radial deviation    Wrist pronation    Wrist supination    Grip strength     (Blank rows = not tested)  TODAY'S TREATMENT:                                                                                                                              DATE: 10/18/22 physical therapy evaluation and HEP instruction   PATIENT EDUCATION:  Education details: Patient educated on exam findings, POC, scope of PT, HEP, and what to expect next visit. Person educated: Patient Education method: Explanation, Demonstration, and Handouts Education comprehension: verbalized understanding, returned demonstration, verbal cues required, and tactile cues required   HOME EXERCISE PROGRAM: Access Code: [email or web address] URL: [email or web address] Date:  10/18/2022 Prepared by: [facility]  Exercises - Seated Cervical Rotation AROM  - 1 x daily - 7 x weekly - 3 sets - 10 reps - Seated Cervical Sidebending AROM  - 1 x daily - 7 x weekly - 3 sets - 10 reps - Seated Cervical Flexion AROM  - 1 x daily - 7 x weekly - 3 sets - 10 reps - Seated Cervical Extension AROM  - 1 x daily - 7 x weekly - 3 sets - 10 reps - Seated Cervical Retraction  - 1 x daily - 7 x weekly - 1 sets - 10 reps - Correct Seated Posture  - 1 x daily - 7 x weekly - 3 sets - 10 reps  ASSESSMENT:  CLINICAL IMPRESSION: Patient is a 62 y.o. female who was seen today for physical therapy evaluation and treatment for M54.2 (ICD-10-CM) - Cervicalgia M54.2,G89.29 (ICD-10-CM) - Neck pain, chronic. Patient demonstrates decreased strength,  ROM restriction, reduced flexibility, increased tenderness to palpation and postural abnormalities which are likely contributing to symptoms of pain and are negatively impacting patient ability to perform ADLs. Patient will benefit from skilled physical therapy services to address these deficits to reduce pain and improve level of function with ADLs   OBJECTIVE IMPAIRMENTS: decreased activity tolerance, decreased endurance, decreased knowledge of condition, decreased mobility, difficulty walking, decreased ROM, decreased strength, hypomobility, increased fascial restrictions, impaired perceived functional ability, impaired flexibility, and pain.   ACTIVITY LIMITATIONS: carrying, lifting, bending, sitting, standing, sleeping, bathing, reach over head, hygiene/grooming, and caring for others  PARTICIPATION LIMITATIONS: meal prep, cleaning, laundry, driving, shopping, community activity, occupation, and yard work  [facility] POTENTIAL: Good  CLINICAL DECISION MAKING: Stable/uncomplicated  EVALUATION COMPLEXITY: Low   GOALS: Goals reviewed with patient? No  SHORT TERM GOALS: Target date: 11/01/2022  patient will be independent with initial  HEP  Baseline:  Goal status: INITIAL  2.  Patient will self report 50% improvement to improve tolerance for functional activity  Baseline:  Goal status: INITIAL    LONG TERM GOALS: Target date: 11/15/2022  Patient will be independent in self management strategies to improve quality of life and functional outcomes.  Baseline:  Goal status: INITIAL  2.  Patient will self report 50% improvement to improve tolerance for functional activity  Baseline:  Goal status: INITIAL  3.  Patient will increased cervical mobility by 40 degrees throughout to improve ability to scan for traffic with driving Baseline: see above Goal status: INITIAL  4.  Patient will improve FOTO score by 10 points   Baseline: 48 Goal status: INITIAL  5.  Patient will increase  shoulder flexion MMTs to 4+ to 5/5 without pain to promote return to lifting milk out of the fridge without pain  Baseline:  Goal status: INITIAL  6.  Patient will increased bilateral shoulder flexion to 140 degrees or better to be able to reach overhead to fix her hair Baseline: see above Goal status: INITIAL   PLAN:  PT FREQUENCY: 2x/week  PT DURATION: 4 weeks  PLANNED INTERVENTIONS: Therapeutic exercises, Therapeutic activity, Neuromuscular re-education, Balance training, Gait training, Patient/Family education, Joint manipulation, Joint mobilization, Stair training, Orthotic/Fit training, DME instructions, Aquatic Therapy, Dry Needling, Electrical stimulation, Spinal manipulation, Spinal mobilization, Cryotherapy, Moist heat, Compression bandaging, scar mobilization, Splintting, Taping, Traction, Ultrasound, Ionotophoresis 4mg /ml Dexamethasone, and Manual therapy   PLAN FOR NEXT SESSION: Review HEP and goals; progress cervical mobility, postural strength and upper extremity strength as able; manual as needed   3:18 PM, 10/18/22 Anyelo Mccue Small Travor Royce MPT [place] [facility] [place] [phone] Ph:[phone]

## 2022-10-16 NOTE — Telephone Encounter (Signed)
Pt has been scheduled for 11/18/22. Instructions mailed

## 2022-10-17 ENCOUNTER — Ambulatory Visit (HOSPITAL_COMMUNITY): Payer: 59

## 2022-10-17 ENCOUNTER — Encounter: Payer: Self-pay | Admitting: Internal Medicine

## 2022-10-17 ENCOUNTER — Ambulatory Visit (HOSPITAL_COMMUNITY)
Admission: RE | Admit: 2022-10-17 | Discharge: 2022-10-17 | Disposition: A | Discharge status: Home / Self Care | Payer: 59 | Source: Ambulatory Visit | Attending: Internal Medicine | Admitting: Internal Medicine

## 2022-10-17 ENCOUNTER — Ambulatory Visit (INDEPENDENT_AMBULATORY_CARE_PROVIDER_SITE_OTHER): Payer: Medicaid Other | Admitting: Internal Medicine

## 2022-10-17 VITALS — BP 136/75 | HR 80 | Ht 62.5 in | Wt 180.8 lb

## 2022-10-17 DIAGNOSIS — M25562 Pain in left knee: Secondary | ICD-10-CM

## 2022-10-17 NOTE — Patient Instructions (Signed)
It was a pleasure to see you today.  Thank you for giving [place] the opportunity to be involved in your care.  Below is a brief recap of your visit and next steps.  We will plan to see you again in June.  Summary Xrays of left knee ordered today Can use tylenol and voltaren gel as needed for pain relief Orthopedic surgery referral placed Follow up in June with Malachi Bonds.

## 2022-10-17 NOTE — Progress Notes (Signed)
   Acute Office Visit  Subjective:     Patient ID: Jacqueline [name], female    DOB: 13-Mar-1961, 62 y.o.   MRN: [id number]  Chief Complaint  Patient presents with   Knee Pain    Knee pain started around 09/09/2022 and has gotten worse. Has sharp pain that shoot through leg   Jacqueline [name] presents for an acute visit today for eval ration of left knee pain.  She describes a 2-20-month history of pain in her left knee, mostly located along the lateral and posterior aspects of the knee.  There is no inciting event or trauma at the onset of pain.  She describes her knee "giving out" and "locking up" on her.  She additionally endorses recent development of burning pain that radiates distally to the dorsal aspect of her left foot.  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  She has tried taking Robaxin and using Voltaren gel for pain relief.  Review of Systems  Musculoskeletal:  Positive for joint pain (Left knee).     Objective:    BP 136/75   Pulse 80   Ht 5' 2.5" (1.588 m)   Wt 180 lb 12.8 oz (82 kg)   LMP 10/26/2016 (Exact Date)   SpO2 94%   BMI 32.54 kg/m   Physical Exam Musculoskeletal:        General: No swelling, tenderness or deformity.     Left lower leg: No edema.     Comments: No obvious deformity on inspection of the left knee ROM and strength are grossly intact No tenderness palpation along the medial and lateral joint lines Negative anterior drawer/Lachman's and McMurray's Negative patellar apprehension test       Assessment & Plan:   Problem List Items Addressed This Visit       Left knee pain - Primary    Presenting today for evaluation of left knee pain that has been present for the last 2-3 months.  She describes instability of the left knee with episodes of locking and catching.  More recently she has developed a burning pain that radiates distally to the dorsal aspect of her left foot.  No obvious findings on exam today.  ROM is  generally intact with no crepitus appreciated.  Specific testing largely negative as well.  Symptoms are potentially attributable to underlying degenerative changes. -Left knee x-rays ordered today -Orthopedic surgery referral placed -Recommended use of Voltaren gel and Tylenol for as needed pain relief -She will return to care if her symptoms worsen or fail to improve.  Otherwise she is scheduled for routine follow-up with her PCP in late June.      Relevant Orders   DG Knee Complete 4 Views Left   Ambulatory referral to Orthopedic Surgery   Return if symptoms worsen or fail to improve.  Jacqueline Lade, MD

## 2022-10-17 NOTE — Assessment & Plan Note (Signed)
Presenting today for evaluation of left knee pain that has been present for the last 2-3 months.  She describes instability of the left knee with episodes of locking and catching.  More recently she has developed a burning pain that radiates distally to the dorsal aspect of her left foot.  No obvious findings on exam today.  ROM is generally intact with no crepitus appreciated.  Specific testing largely negative as well.  Symptoms are potentially attributable to underlying degenerative changes. -Left knee x-rays ordered today -Orthopedic surgery referral placed -Recommended use of Voltaren gel and Tylenol for as needed pain relief -She will return to care if her symptoms worsen or fail to improve.  Otherwise she is scheduled for routine follow-up with her PCP in late June.

## 2022-10-18 ENCOUNTER — Ambulatory Visit (HOSPITAL_COMMUNITY): Payer: 59 | Attending: Internal Medicine

## 2022-10-18 ENCOUNTER — Other Ambulatory Visit: Payer: Self-pay

## 2022-10-18 DIAGNOSIS — G8929 Other chronic pain: Secondary | ICD-10-CM | POA: Insufficient documentation

## 2022-10-18 DIAGNOSIS — M542 Cervicalgia: Secondary | ICD-10-CM | POA: Diagnosis not present

## 2022-10-23 ENCOUNTER — Other Ambulatory Visit: Payer: Self-pay | Admitting: Family Medicine

## 2022-10-25 ENCOUNTER — Ambulatory Visit (INDEPENDENT_AMBULATORY_CARE_PROVIDER_SITE_OTHER): Payer: 59 | Admitting: Family Medicine

## 2022-10-25 ENCOUNTER — Encounter: Payer: Self-pay | Admitting: Family Medicine

## 2022-10-25 VITALS — BP 128/76 | HR 78 | Ht 62.0 in | Wt 182.1 lb

## 2022-10-25 DIAGNOSIS — H10023 Other mucopurulent conjunctivitis, bilateral: Secondary | ICD-10-CM

## 2022-10-25 DIAGNOSIS — J209 Acute bronchitis, unspecified: Secondary | ICD-10-CM

## 2022-10-25 DIAGNOSIS — I10 Essential (primary) hypertension: Secondary | ICD-10-CM | POA: Diagnosis not present

## 2022-10-25 DIAGNOSIS — J0100 Acute maxillary sinusitis, unspecified: Secondary | ICD-10-CM | POA: Diagnosis not present

## 2022-10-25 DIAGNOSIS — H109 Unspecified conjunctivitis: Secondary | ICD-10-CM | POA: Insufficient documentation

## 2022-10-25 MED ORDER — FLUCONAZOLE 150 MG PO TABS: ORAL_TABLET | ORAL | 0 refills | Status: DC

## 2022-10-25 MED ORDER — PENICILLIN V POTASSIUM 500 MG PO TABS: 500.0000 mg | ORAL_TABLET | Freq: Three times a day (TID) | ORAL | 0 refills | Status: DC

## 2022-10-25 MED ORDER — GENTAMICIN SULFATE 0.3 % OP SOLN: 1.0000 [drp] | Freq: Three times a day (TID) | OPHTHALMIC | 0 refills | Status: AC

## 2022-10-25 MED ORDER — BENZONATATE 100 MG PO CAPS: 100.0000 mg | ORAL_CAPSULE | Freq: Three times a day (TID) | ORAL | 0 refills | Status: DC | PRN

## 2022-10-25 NOTE — Patient Instructions (Signed)
F/U with PCP as before, call if you need to be seen sooner  You are treated for acute sinusitis, bronchitis, and conjunctivitis  Four medications are sent to your pharmacy  Voice rest and salt water gargles will help you to recover your voice also  Thanks for choosing [facility], we consider it a privelige to serve you.

## 2022-10-25 NOTE — Progress Notes (Unsigned)
   Jacqueline [name]     MRN: [id number]      DOB: [birth year]  Chief Complaint  Patient presents with   Sore Throat    Sore throat cough eye drainage since Saturday     HPI Jacqueline [name] is here  with a 1 week h/o progressive respiratory symptoms, sore throat , then cough, then bloody left nasaldrainage, 3 days natted eyes  ROS Denies recent fever or chills. Denies sinus pressure, nasal congestion, ear pain or sore throat. Denies chest congestion, productive cough or wheezing. Denies chest pains, palpitations and leg swelling Denies abdominal pain, nausea, vomiting,diarrhea or constipation.   Denies dysuria, frequency, hesitancy or incontinence. Denies joint pain, swelling and limitation in mobility. Denies headaches, seizures, numbness, or tingling. Denies depression, anxiety or insomnia. Denies skin break down or rash.   PE  BP 128/76 (BP Location: Right Arm, Patient Position: Sitting, Cuff Size: Normal)   Pulse 78   Ht 5\' 2"  (1.575 m)   Wt 182 lb 1.9 oz (82.6 kg)   LMP 10/26/2016 (Exact Date)   SpO2 95%   BMI 33.31 kg/m   Patient alert and oriented and in no cardiopulmonary distress.  HEENT: No facial asymmetry, EOMI,     Neck supple .  Chest: Clear to auscultation bilaterally.  CVS: S1, S2 no murmurs, no S3.Regular rate.  ABD: Soft non tender.   Ext: No edema  MS: Adequate ROM spine, shoulders, hips and knees.  Skin: Intact, no ulcerations or rash noted.  Psych: Good eye contact, normal affect. Memory intact not anxious or depressed appearing.  CNS: CN 2-12 intact, power,  normal throughout.no focal deficits noted.   Assessment & Plan  No problem-specific Assessment & Plan notes found for this encounter.

## 2022-10-26 ENCOUNTER — Encounter: Payer: Self-pay | Admitting: Family Medicine

## 2022-10-26 DIAGNOSIS — J0100 Acute maxillary sinusitis, unspecified: Secondary | ICD-10-CM | POA: Insufficient documentation

## 2022-10-26 DIAGNOSIS — H10023 Other mucopurulent conjunctivitis, bilateral: Secondary | ICD-10-CM | POA: Insufficient documentation

## 2022-10-26 NOTE — Assessment & Plan Note (Signed)
Tessalon perle and Pen V prescibed

## 2022-10-26 NOTE — Assessment & Plan Note (Signed)
Pen V prescribed 

## 2022-10-26 NOTE — Assessment & Plan Note (Signed)
Garamycin eyedrops prescribed x 1 week

## 2022-10-26 NOTE — Assessment & Plan Note (Signed)
Controlled, no change in medication  

## 2022-10-28 ENCOUNTER — Ambulatory Visit (INDEPENDENT_AMBULATORY_CARE_PROVIDER_SITE_OTHER): Payer: 59 | Admitting: Orthopedic Surgery

## 2022-10-28 ENCOUNTER — Encounter: Payer: Self-pay | Admitting: Orthopedic Surgery

## 2022-10-28 VITALS — BP 130/78 | HR 80 | Ht 63.0 in | Wt 180.0 lb

## 2022-10-28 DIAGNOSIS — S83242A Other tear of medial meniscus, current injury, left knee, initial encounter: Secondary | ICD-10-CM | POA: Diagnosis not present

## 2022-10-28 DIAGNOSIS — M25562 Pain in left knee: Secondary | ICD-10-CM | POA: Diagnosis not present

## 2022-10-28 DIAGNOSIS — G8929 Other chronic pain: Secondary | ICD-10-CM | POA: Diagnosis not present

## 2022-10-28 NOTE — Progress Notes (Addendum)
Chief Complaint  Patient presents with   Knee Pain    L/ hurting but not as bad as it has been.   62 year old female status post lumbar fusion from a back injury surgery performed 2021.  Patient says she has had multiple injections still having problems with her lower back.  Comes in complaining of 1 year history of left knee pain but no history of trauma.  She complains of giving out while she is walking with no specific twist or turns in the knee.  She has been on diclofenac since January without relief.  The pain is in the back of the knee distal posterior thigh and proximal calf with some anteromedial pain as well.  No swelling or loss of motion just the giving out for no reasonr   Review of systems back pain leg pain no fever  Past Medical History:  Diagnosis Date   Anemia    Arthritis    per patient, back   Complication of anesthesia 2018   per patient HR dropped during partial hysterectomy surgery   Fibroid uterus    GERD (gastroesophageal reflux disease)    H/O total hysterectomy    Hyperlipidemia    Borderline   Hypertension    Numbness and tingling in right hand    Pre-diabetes    borderline   Past Surgical History:  Procedure Laterality Date   BACK SURGERY     BALLOON DILATION N/A 05/22/2021   Procedure: BALLOON DILATION;  Surgeon: Lanelle Bal, DO;  [facility];  Service: Endoscopy;  Laterality: N/A;   CHOLECYSTECTOMY     COLONOSCOPY  2003   Dr. Jena Gauss: Anal canal hemorrhoids   COLONOSCOPY N/A 08/25/2014   Normal colonoscopy.  Next colonoscopy in 2026.  Procedure: COLONOSCOPY;  Surgeon: Corbin Ade, MD;  [facility];  Service: Endoscopy;  Laterality: N/A;  1200   ESOPHAGOGASTRODUODENOSCOPY N/A 08/25/2014   Procedure: ESOPHAGOGASTRODUODENOSCOPY (EGD);  Surgeon: Corbin Ade, MD;  [facility];  Service: Endoscopy;  Laterality: N/A;   ESOPHAGOGASTRODUODENOSCOPY (EGD) WITH PROPOFOL N/A 05/22/2021   Web and distal esophagus,  dilated, gastriti, biopsy positive for mildly active H. pylori gastritis.  s procedure: ESOPHAGOGASTRODUODENOSCOPY (EGD) WITH PROPOFOL;  Surgeon: Lanelle Bal, DO;  [facility];  Service: Endoscopy;  Laterality: N/A;  9:15am   SALPINGOOPHORECTOMY Bilateral 11/06/2016   Procedure: BILATERAL SALPINGO OOPHORECTOMY;  Surgeon: Lazaro Arms, MD;  Location: [facility];  Service: Gynecology;  Laterality: Bilateral;   SUPRACERVICAL ABDOMINAL HYSTERECTOMY N/A 11/06/2016   Procedure: HYSTERECTOMY SUPRACERVICAL ABDOMINAL;  Surgeon: Lazaro Arms, MD;  Location: [facility];  Service: Gynecology;  Laterality: N/A;   TRANSFORAMINAL LUMBAR INTERBODY FUSION (TLIF) WITH PEDICLE SCREW FIXATION 2 LEVEL Left 07/28/2019   Procedure: LEFT-SIDED LUMBAR 4- 5, LUMBAR 5 -SACRUM1 TRANSFORAMINAL LUMBAR INTERBODY FUSION WITH INSTRUMENTATION AND ALLOGRAFT;  Surgeon: Estill Bamberg, MD;  Location: [facility];  Service: Orthopedics;  Laterality: Left;   TUBAL LIGATION      BP 130/78   Pulse 80   Ht 5\' 3"  (1.6 m)   Wt 180 lb (81.6 kg)   LMP 10/26/2016 (Exact Date)   BMI 31.89 kg/m   Physical Exam Vitals and nursing note reviewed.  Constitutional:      Appearance: Normal appearance.  HENT:     Head: Normocephalic and atraumatic.  Eyes:     General: No scleral icterus.       Right eye: No discharge.  Left eye: No discharge.     Extraocular Movements: Extraocular movements intact.     Conjunctiva/sclera: Conjunctivae normal.     Pupils: Pupils are equal, round, and reactive to light.  Cardiovascular:     Rate and Rhythm: Normal rate.     Pulses: Normal pulses.  Musculoskeletal:     Right knee:     Instability Tests: Medial McMurray test negative and lateral McMurray test negative.     Left knee:     Instability Tests: Medial McMurray test positive. Lateral McMurray test negative.  Skin:    General: Skin is warm and dry.     Capillary Refill: Capillary refill takes less than 2 seconds.   Neurological:     General: No focal deficit present.     Mental Status: She is alert and oriented to person, place, and time.  Psychiatric:        Mood and Affect: Mood normal.        Behavior: Behavior normal.        Thought Content: Thought content normal.        Judgment: Judgment normal.      Right Knee Exam   Muscle Strength  The patient has normal right knee strength.  Tenderness  The patient is experiencing no tenderness.   Range of Motion  Extension:  normal  Flexion:  normal   Tests  McMurray:  Medial - negative Lateral - negative Varus: negative Valgus: negative Drawer:  Anterior - negative    Posterior - negative  Other  Erythema: absent Scars: absent Sensation: normal Pulse: present Swelling: none   Left Knee Exam   Muscle Strength  The patient has normal left knee strength.  Tenderness  The patient is experiencing tenderness in the medial joint line and patella.  Range of Motion  Extension:  normal  Flexion:  normal   Tests  McMurray:  Medial - positive Lateral - negative Varus: negative Valgus: negative Drawer:  Anterior - negative     Posterior - negative  Other  Erythema: absent Scars: absent Sensation: normal Pulse: present Swelling: none  Comments:  Soft medial meniscus pathology in terms of the McMurray's sign     Encounter Diagnoses  Name Primary?   Acute medial meniscus tear of left knee, initial encounter Yes   Chronic pain of left knee     Imaging was performed at the hospital x-rays show very minimal changes in the knee in terms of arthritis  I would actually make it a grade #2   Assessment and plan  62 year old female status post lumbar fusion with continued leg pain and giving out of the left knee  She has had her 4-week course plus of Voltaren/diclofenac without relief.  The patient will need an MRI to rule out the knee versus the back as the cause of her leg pain knee pain and giving out  Differential  diagnosis includes a loose body, medial meniscus tear, chondral defect  I will call the results to her once I read the report  Moderate risk based on the unclear differential diagnosis the confounding variable of chronic back pain with leg pain and unsuccessful back surgery.

## 2022-10-29 ENCOUNTER — Telehealth: Payer: Self-pay | Admitting: Orthopedic Surgery

## 2022-10-29 NOTE — Telephone Encounter (Signed)
Dr. Mort Sawyers pt - pt called to schedule her MRI f/u appt and stated she wanted to let Dr. Romeo Apple know that she was just laying in bed and tilted a little and is have pain in her leg and it is causing her to limp.

## 2022-10-31 ENCOUNTER — Encounter: Payer: Self-pay | Admitting: Orthopedic Surgery

## 2022-10-31 ENCOUNTER — Encounter: Payer: Self-pay | Admitting: *Deleted

## 2022-10-31 ENCOUNTER — Ambulatory Visit (INDEPENDENT_AMBULATORY_CARE_PROVIDER_SITE_OTHER): Payer: 59 | Admitting: Orthopedic Surgery

## 2022-10-31 VITALS — BP 130/78 | Ht 63.0 in | Wt 180.0 lb

## 2022-10-31 DIAGNOSIS — S83242A Other tear of medial meniscus, current injury, left knee, initial encounter: Secondary | ICD-10-CM | POA: Diagnosis not present

## 2022-10-31 DIAGNOSIS — M25462 Effusion, left knee: Secondary | ICD-10-CM | POA: Diagnosis not present

## 2022-10-31 NOTE — Patient Instructions (Addendum)
While we are working on your approval for MRI please go ahead and call to schedule your appointment with [facility] within at least one (1) week.   Central Scheduling [phone]  Call to see it they can get you in sooner for MRI scan in one of our [facility] office   Ice the knee 4 times a day for 30 minutes  Wear the brace and use the walker when walking  Out of work 10 days

## 2022-10-31 NOTE — Progress Notes (Addendum)
Chief Complaint  Patient presents with   Knee Pain    Increased pain / left knee can't walk well now and can't bend knee very well due to pain swelling has been using ice    HPI  10/28/2022 62 year old female status post lumbar fusion from a back injury surgery performed 2021.  Patient says she has had multiple injections still having problems with her lower back.  Comes in complaining of 1 year history of left knee pain but no history of trauma.  She complains of giving out while she is walking with no specific twist or turns in the knee.  She has been on diclofenac since January without relief.  The pain is in the back of the knee distal posterior thigh and proximal calf with some anteromedial pain as well.  No swelling or loss of motion just the giving out for no reason  10/31/22  62 year old female who presents with increased pain and swelling decreased range of motion left knee just seen about 3 days ago scheduled for MRI but is not until June 10  Patient advised to get MRI scheduled quicker  Today patient is in quite a bit of pain.  She has a moderate effusion.  She has decreased range of motion in the knee.  We did an aspiration of the left knee we obtained 20 cc of clear yellow fluid.  We placed an Ace wrap advised to use an economy hinged brace and a walker.  Economy hinged brace applied  Patient to go out of work for 10 days  Follow-up 1 week after MRI obtained  Encounter Diagnoses  Name Primary?   Acute medial meniscus tear of left knee, initial encounter Yes   Effusion of knee joint, left     Aspiration left knee Patient consented for aspiration left knee  Superolateral approach  No injection  Skin cleaned with alcohol and ethyl chloride was sprayed onto help with anesthesia 18-gauge needle was used to aspirate 20 cc of clear yellow fluid.  Ace wrap applied no injection of steroid or lidocaine

## 2022-11-01 ENCOUNTER — Ambulatory Visit (HOSPITAL_COMMUNITY): Payer: 59 | Admitting: Physical Therapy

## 2022-11-01 DIAGNOSIS — M542 Cervicalgia: Secondary | ICD-10-CM

## 2022-11-01 DIAGNOSIS — G8929 Other chronic pain: Secondary | ICD-10-CM | POA: Diagnosis not present

## 2022-11-01 NOTE — Therapy (Addendum)
OUTPATIENT PHYSICAL THERAPY CERVICAL Treatment   Patient Name: Jacqueline [name] MRN: [id number] DOB:[birth year], 62 y.o., female Today's Date: 11/01/2022  END OF SESSION:  PT End of Session - 11/01/22 1030     Visit Number 2    Number of Visits 8    Date for PT Re-Evaluation 11/15/22    Authorization Type [employer]    PT Start Time [phone]    PT Stop Time 1030    PT Time Calculation (min) 38 min    Activity Tolerance Patient tolerated treatment well    Behavior During Therapy [facility] for tasks assessed/performed              Past Medical History:  Diagnosis Date   Anemia    Arthritis    per patient, back   Complication of anesthesia 2018   per patient HR dropped during partial hysterectomy surgery   Fibroid uterus    GERD (gastroesophageal reflux disease)    H/O total hysterectomy    Hyperlipidemia    Borderline   Hypertension    Numbness and tingling in right hand    Pre-diabetes    borderline   Past Surgical History:  Procedure Laterality Date   BACK SURGERY     BALLOON DILATION N/A 05/22/2021   Procedure: BALLOON DILATION;  Surgeon: Lanelle Bal, DO;  Location: [facility];  Service: Endoscopy;  Laterality: N/A;   CHOLECYSTECTOMY     COLONOSCOPY  2003   Dr. Jena Gauss: Anal canal hemorrhoids   COLONOSCOPY N/A 08/25/2014   Normal colonoscopy.  Next colonoscopy in 2026.  Procedure: COLONOSCOPY;  Surgeon: Corbin Ade, MD;  Location: [facility];  Service: Endoscopy;  Laterality: N/A;  1200   ESOPHAGOGASTRODUODENOSCOPY N/A 08/25/2014   Procedure: ESOPHAGOGASTRODUODENOSCOPY (EGD);  Surgeon: Corbin Ade, MD;  Location: [facility];  Service: Endoscopy;  Laterality: N/A;   ESOPHAGOGASTRODUODENOSCOPY (EGD) WITH PROPOFOL N/A 05/22/2021   Web and distal esophagus, dilated, gastriti, biopsy positive for mildly active H. pylori gastritis.  s procedure: ESOPHAGOGASTRODUODENOSCOPY (EGD) WITH PROPOFOL;  Surgeon: Lanelle Bal, DO;  Location: [facility];   Service: Endoscopy;  Laterality: N/A;  9:15am   SALPINGOOPHORECTOMY Bilateral 11/06/2016   Procedure: BILATERAL SALPINGO OOPHORECTOMY;  Surgeon: Lazaro Arms, MD;  Location: [facility] ORS;  Service: Gynecology;  Laterality: Bilateral;   SUPRACERVICAL ABDOMINAL HYSTERECTOMY N/A 11/06/2016   Procedure: HYSTERECTOMY SUPRACERVICAL ABDOMINAL;  Surgeon: Lazaro Arms, MD;  Location: [facility] ORS;  Service: Gynecology;  Laterality: N/A;   TRANSFORAMINAL LUMBAR INTERBODY FUSION (TLIF) WITH PEDICLE SCREW FIXATION 2 LEVEL Left 07/28/2019   Procedure: LEFT-SIDED LUMBAR 4- 5, LUMBAR 5 -SACRUM1 TRANSFORAMINAL LUMBAR INTERBODY FUSION WITH INSTRUMENTATION AND ALLOGRAFT;  Surgeon: Estill Bamberg, MD;  Location: [facility] OR;  Service: Orthopedics;  Laterality: Left;   TUBAL LIGATION     Patient Active Problem List   Diagnosis Date Noted   Acute non-recurrent maxillary sinusitis 10/26/2022   Mucopurulent conjunctivitis of both eyes 10/26/2022   Acute bronchitis 10/25/2022   Maxillary sinusitis, acute 10/25/2022   Conjunctivitis 10/25/2022   Neck pain, chronic 08/28/2022   Sleep apnea 08/28/2022   Need for immunization against influenza 02/28/2022   Screening for cervical cancer 12/21/2021   H. pylori infection 12/05/2021   Snoring 11/23/2021   Insomnia 11/23/2021   Annual physical exam 10/16/2021   Dermatitis 10/16/2021   Need for varicella vaccine 10/16/2021   Diarrhea 07/05/2021   Stomach cramps 07/05/2021   Nausea & vomiting 07/05/2021   Encounter for vaccination  07/05/2021   Right foot pain 05/16/2021   Dysphagia 05/01/2021   Right shoulder pain 12/27/2020   Rash 12/13/2020   Chronic back pain 04/04/2020   History of back surgery 04/04/2020   Radiculopathy 07/28/2019   Borderline diabetes 12/25/2018   Hypertension 06/30/2018   S/P abdominal supracervical subtotal hysterectomy 11/06/2016   Trigger middle finger of right hand 04/24/2016   GERD (gastroesophageal reflux disease) 08/04/2014   Shoulder strain  07/26/2014   Degenerative arthritis of thumb 07/26/2014   Left knee pain 02/28/2014   Hip pain 04/15/2013   Epicondylitis, lateral (tennis elbow) 04/13/2013   Constipation 09/15/2012   Hyperlipidemia 11/28/2011   Obesity, Class I, BMI 30.0-34.9 (see actual BMI) 09/15/2011   ALLERGIC RHINITIS, SEASONAL 11/13/2007    PCP: Gilmore Laroche, FNP  REFERRING PROVIDER: Darreld Mclean, MD  REFERRING DIAG: M54.2 (ICD-10-CM) - Cervicalgia M54.2,G89.29 (ICD-10-CM) - Neck pain, chronic  THERAPY DIAG:  Neck pain  Rationale for Evaluation and Treatment: Rehabilitation  ONSET DATE: over a year  SUBJECTIVE:                                                                                                                                                                                                         SUBJECTIVE STATEMENT: Pt states that she has been doing the exercises given to her and has no questions.    PERTINENT HISTORY:  Having some lower extremity issues Back fusion in 2021  PAIN:  Are you having pain? Yes: NPRS scale: 5/10 Pain location: neck Pain description: hurting Aggravating factors: turning it the way, extension Relieving factors: massage, muscle relaxer  PRECAUTIONS: None  WEIGHT BEARING RESTRICTIONS: No  FALLS:  Has patient fallen in last 6 months? No  OCCUPATION: work part time as a Conservation officer, [employer]  PLOF: Independent  PATIENT GOALS: get it right  NEXT MD VISIT: 12/03/22 PCP  OBJECTIVE:   DIAGNOSTIC FINDINGS:    Impression:  some degenerative changes of the cervical spine as noted, no acute findings.   Electronically Signed Darreld Mclean, MD 12/12/20232:32 PM  PATIENT SURVEYS:  FOTO 48  COGNITION: Overall cognitive status: Within functional limits for tasks assessed   POSTURE: rounded shoulders, forward head, and guarding  PALPATION: Tender bilateral upper traps and levator   CERVICAL ROM:   Active ROM AROM (deg) eval  Flexion 12  Extension 14   Right lateral flexion 19  Left lateral flexion 20  Right rotation 31  Left rotation 36   (Blank rows = not tested)  UPPER EXTREMITY ROM:  Active ROM Right eval Left eval  Shoulder  flexion ~100 deg ~90 deg*  Shoulder extension    Shoulder abduction    Shoulder adduction    Shoulder extension    Shoulder internal rotation    Shoulder external rotation    Elbow flexion    Elbow extension    Wrist flexion    Wrist extension    Wrist ulnar deviation    Wrist radial deviation    Wrist pronation    Wrist supination     (Blank rows = not tested)  UPPER EXTREMITY MMT:  MMT Right eval Left eval  Shoulder flexion 4 4  Shoulder extension    Shoulder abduction    Shoulder adduction    Shoulder extension    Shoulder internal rotation    Shoulder external rotation    Middle trapezius    Lower trapezius    Elbow flexion 4+ 4+  Elbow extension 4+ 4+  Wrist flexion    Wrist extension    Wrist ulnar deviation    Wrist radial deviation    Wrist pronation    Wrist supination    Grip strength     (Blank rows = not tested)  TODAY'S TREATMENT:                                                                                                                              DATE:  11/01/22: Sitting: Cervical stabilization with wand shoulder flexion x 10 Cervical stabilization with wand scapular protraction/retraction x 10 Cervical excursion Scapular retraction with cervical retraction x 10   Cervical isometric for SB and extension 5 x each x 5"  Retro shoulder rolls Cervical excursion x 5   10/18/22 physical therapy evaluation and HEP instruction   PATIENT EDUCATION:  Education details: Patient educated on exam findings, POC, scope of PT, HEP, and what to expect next visit. Person educated: Patient Education method: Explanation, Demonstration, and Handouts Education comprehension: verbalized understanding, returned demonstration, verbal cues required, and tactile cues  required   HOME EXERCISE PROGRAM:             11/01/22            - Seated Shoulder Flexion AAROM with Dowel  - 1 x daily - 7 x weekly - 1 sets - 10 reps - Seated Scapular Protraction and Retraction  - 1 x daily - 7 x weekly - 1 sets - 10 reps - 3-5 seconds  hold - Seated Isometric Cervical Extension  - 1 x daily - 7 x weekly - 1 sets - 5-10 reps - 3-5 hold - Seated Isometric Cervical Sidebending  - 1 x daily - 7 x weekly - 1 sets - 5-10 reps - 3-5  hold Access Code: [email or web address] URL: [email or web address] Date: 10/18/2022 Prepared by: [facility]  Exercises - Seated Cervical Rotation AROM  - 1 x daily - 7 x weekly - 3 sets - 10 reps - Seated Cervical Sidebending AROM  - 1 x daily - 7  x weekly - 3 sets - 10 reps - Seated Cervical Flexion AROM  - 1 x daily - 7 x weekly - 3 sets - 10 reps - Seated Cervical Extension AROM  - 1 x daily - 7 x weekly - 3 sets - 10 reps - Seated Cervical Retraction  - 1 x daily - 7 x weekly - 1 sets - 10 reps - Correct Seated Posture  - 1 x daily - 7 x weekly - 3 sets - 10 reps  ASSESSMENT:  CLINICAL IMPRESSION: Therapist reviewed evaluation and goals with pt.  Therapist progressed cervical stabilization exercises with shoulder motion.  Therapist updated HEP.  Patient demonstrates decreased strength, ROM restriction, reduced flexibility, increased tenderness to palpation and postural abnormalities which are likely contributing to symptoms of pain and are negatively impacting patient ability to perform ADLs. Patient will benefit from skilled physical therapy services to address these deficits to reduce pain and improve level of function with ADLs   OBJECTIVE IMPAIRMENTS: decreased activity tolerance, decreased endurance, decreased knowledge of condition, decreased mobility, difficulty walking, decreased ROM, decreased strength, hypomobility, increased fascial restrictions, impaired perceived functional ability, impaired flexibility, and pain.   ACTIVITY  LIMITATIONS: carrying, lifting, bending, sitting, standing, sleeping, bathing, reach over head, hygiene/grooming, and caring for others  PARTICIPATION LIMITATIONS: meal prep, cleaning, laundry, driving, shopping, community activity, occupation, and yard work  [facility] POTENTIAL: Good  CLINICAL DECISION MAKING: Stable/uncomplicated  EVALUATION COMPLEXITY: Low   GOALS: Goals reviewed with patient? No  SHORT TERM GOALS: Target date: 11/01/2022  patient will be independent with initial HEP  Baseline:  Goal status: MET  2.  Patient will self report 50% improvement to improve tolerance for functional activity  Baseline:  Goal status: IN PROGRESS    LONG TERM GOALS: Target date: 11/15/2022  Patient will be independent in self management strategies to improve quality of life and functional outcomes.  Baseline:  Goal status: IN PROGRESS  2.  Patient will self report 50% improvement to improve tolerance for functional activity  Baseline:  Goal status: IN PROGRESS  3.  Patient will increased cervical mobility by 40 degrees throughout to improve ability to scan for traffic with driving Baseline: see above Goal status: MET  4.  Patient will improve FOTO score by 10 points   Baseline: 48 Goal status: IN PROGRESS  5.  Patient will increase  shoulder flexion MMTs to 4+ to 5/5 without pain to promote return to lifting milk out of the fridge without pain  Baseline:  Goal status: IN PROGRESS  6.  Patient will increased bilateral shoulder flexion to 140 degrees or better to be able to reach overhead to fix her hair Baseline: see above Goal status: IN PROGRESS   PLAN:  PT FREQUENCY: 2x/week  PT DURATION: 4 weeks  PLANNED INTERVENTIONS: Therapeutic exercises, Therapeutic activity, Neuromuscular re-education, Balance training, Gait training, Patient/Family education, Joint manipulation, Joint mobilization, Stair training, Orthotic/Fit training, DME instructions, Aquatic  Therapy, Dry Needling, Electrical stimulation, Spinal manipulation, Spinal mobilization, Cryotherapy, Moist heat, Compression bandaging, scar mobilization, Splintting, Taping, Traction, Ultrasound, Ionotophoresis 4mg /ml Dexamethasone, and Manual therapy   PLAN FOR NEXT SESSION: UBE , progress cervical mobility, postural strength and upper extremity strength as able; manual as needed   10:30 AM, 11/01/22 Virgina Organ, PT CLT [phone]

## 2022-11-02 ENCOUNTER — Other Ambulatory Visit: Payer: Self-pay | Admitting: Family Medicine

## 2022-11-02 DIAGNOSIS — I10 Essential (primary) hypertension: Secondary | ICD-10-CM

## 2022-11-06 ENCOUNTER — Encounter (HOSPITAL_COMMUNITY): Payer: Self-pay

## 2022-11-06 ENCOUNTER — Ambulatory Visit (HOSPITAL_COMMUNITY): Payer: 59

## 2022-11-06 ENCOUNTER — Telehealth: Payer: Self-pay | Admitting: Orthopedic Surgery

## 2022-11-06 NOTE — Telephone Encounter (Signed)
It was still pending review when it was cancelled. Someone else has been given that appointment time / extended work note until next visit  She said her knee and whole leg is swollen she wants Dr Romeo Apple to be aware  To you FYI

## 2022-11-06 NOTE — Telephone Encounter (Signed)
Dr. Mort Sawyers pt - pt lvm stating that her MRI was scheduled for today and they cancelled it due to insurance.  She would like a call back.  The patient also has two appts scheduled for MRI f/u, please advise if these need to be cancelled.

## 2022-11-06 NOTE — Telephone Encounter (Signed)
It was approved not sure why it was cancelled I called her to advise, Jacqueline [name] states its approved too I have messaged Jacqueline [name] to see what is going on

## 2022-11-06 NOTE — Telephone Encounter (Signed)
I have sent message to Toniann Fail to find out what is going on with it.

## 2022-11-07 ENCOUNTER — Encounter (HOSPITAL_COMMUNITY): Payer: 59

## 2022-11-07 NOTE — Telephone Encounter (Signed)
I've spoken w/the patient and she is calling to schedule

## 2022-11-07 NOTE — Telephone Encounter (Signed)
Dr. Mort Sawyers pt - spoke w/the patient, she stated that the MRI was approved, but the site was denied.  They approved for her to have it at [facility], approval # [id number].  The patient stated they will not let her schedule until they have the order.  She stated that they currently have an opening for Sat if they can get the order quick enough she can schedule for that.  Amy, she would like for you to call her.

## 2022-11-08 ENCOUNTER — Ambulatory Visit (HOSPITAL_COMMUNITY): Payer: 59

## 2022-11-08 ENCOUNTER — Encounter (HOSPITAL_COMMUNITY): Payer: Self-pay

## 2022-11-08 NOTE — Telephone Encounter (Signed)
, °

## 2022-11-09 ENCOUNTER — Other Ambulatory Visit: Payer: Self-pay | Admitting: Family Medicine

## 2022-11-09 ENCOUNTER — Ambulatory Visit
Admission: RE | Admit: 2022-11-09 | Discharge: 2022-11-09 | Disposition: A | Discharge status: Home / Self Care | Payer: 59 | Source: Ambulatory Visit | Attending: Orthopedic Surgery | Admitting: Orthopedic Surgery

## 2022-11-09 DIAGNOSIS — S83242A Other tear of medial meniscus, current injury, left knee, initial encounter: Secondary | ICD-10-CM

## 2022-11-09 DIAGNOSIS — G8929 Other chronic pain: Secondary | ICD-10-CM

## 2022-11-09 DIAGNOSIS — M25562 Pain in left knee: Secondary | ICD-10-CM | POA: Diagnosis not present

## 2022-11-09 DIAGNOSIS — S83212A Bucket-handle tear of medial meniscus, current injury, left knee, initial encounter: Secondary | ICD-10-CM | POA: Diagnosis not present

## 2022-11-09 DIAGNOSIS — Z419 Encounter for procedure for purposes other than remedying health state, unspecified: Secondary | ICD-10-CM | POA: Diagnosis not present

## 2022-11-09 DIAGNOSIS — M25462 Effusion, left knee: Secondary | ICD-10-CM | POA: Diagnosis not present

## 2022-11-11 ENCOUNTER — Telehealth: Payer: Self-pay | Admitting: Family Medicine

## 2022-11-11 NOTE — Telephone Encounter (Signed)
Back in 06/14/2022, Pt asked to have order to cancel her CPAP so that she will not be charged anymore, is now requesting a cpap machine again but different kind of machine, I advised pt she may need an office visit for this... upcoming visit is on 12/03/2022, says she is okay with waiting until then if need be. Please advice?

## 2022-11-11 NOTE — Telephone Encounter (Signed)
Patient called cpap not the mask over nose, tube only. Nurse please contact patient she is confused. Asking something to put on her chest. Pharmacy: [facility]

## 2022-11-11 NOTE — Telephone Encounter (Signed)
Will reconvene at her upcoming visit on 12/03/2022

## 2022-11-12 ENCOUNTER — Ambulatory Visit (HOSPITAL_COMMUNITY): Payer: 59 | Attending: Internal Medicine

## 2022-11-12 ENCOUNTER — Encounter (HOSPITAL_COMMUNITY): Payer: Self-pay

## 2022-11-12 DIAGNOSIS — M542 Cervicalgia: Secondary | ICD-10-CM | POA: Diagnosis not present

## 2022-11-12 NOTE — Therapy (Signed)
OUTPATIENT PHYSICAL THERAPY CERVICAL Treatment   Patient Name: Jacqueline [name] MRN: [id number] DOB:[birth year], 62 y.o., female Today's Date: 11/12/2022  END OF SESSION:  END OF SESSION:   PT End of Session - 11/12/22 1634     Visit Number 3    Number of Visits 8    Date for PT Re-Evaluation 11/15/22    Authorization Type [employer]    PT Start Time 1451    PT Stop Time 1530    PT Time Calculation (min) 39 min    Activity Tolerance Patient tolerated treatment well    Behavior During Therapy WFL for tasks assessed/performed                Past Medical History:  Diagnosis Date   Anemia    Arthritis    per patient, back   Complication of anesthesia 2018   per patient HR dropped during partial hysterectomy surgery   Fibroid uterus    GERD (gastroesophageal reflux disease)    H/O total hysterectomy    Hyperlipidemia    Borderline   Hypertension    Numbness and tingling in right hand    Pre-diabetes    borderline   Past Surgical History:  Procedure Laterality Date   BACK SURGERY     BALLOON DILATION N/A 05/22/2021   Procedure: BALLOON DILATION;  Surgeon: Lanelle Bal, DO;  Location: [facility];  Service: Endoscopy;  Laterality: N/A;   CHOLECYSTECTOMY     COLONOSCOPY  2003   Dr. Jena Gauss: Anal canal hemorrhoids   COLONOSCOPY N/A 08/25/2014   Normal colonoscopy.  Next colonoscopy in 2026.  Procedure: COLONOSCOPY;  Surgeon: Corbin Ade, MD;  Location: [facility];  Service: Endoscopy;  Laterality: N/A;  1200   ESOPHAGOGASTRODUODENOSCOPY N/A 08/25/2014   Procedure: ESOPHAGOGASTRODUODENOSCOPY (EGD);  Surgeon: Corbin Ade, MD;  Location: [facility];  Service: Endoscopy;  Laterality: N/A;   ESOPHAGOGASTRODUODENOSCOPY (EGD) WITH PROPOFOL N/A 05/22/2021   Web and distal esophagus, dilated, gastriti, biopsy positive for mildly active H. pylori gastritis.  s procedure: ESOPHAGOGASTRODUODENOSCOPY (EGD) WITH PROPOFOL;  Surgeon: Lanelle Bal, DO;   Location: [facility];  Service: Endoscopy;  Laterality: N/A;  9:15am   SALPINGOOPHORECTOMY Bilateral 11/06/2016   Procedure: BILATERAL SALPINGO OOPHORECTOMY;  Surgeon: Lazaro Arms, MD;  Location: [facility];  Service: Gynecology;  Laterality: Bilateral;   SUPRACERVICAL ABDOMINAL HYSTERECTOMY N/A 11/06/2016   Procedure: HYSTERECTOMY SUPRACERVICAL ABDOMINAL;  Surgeon: Lazaro Arms, MD;  Location: [facility];  Service: Gynecology;  Laterality: N/A;   TRANSFORAMINAL LUMBAR INTERBODY FUSION (TLIF) WITH PEDICLE SCREW FIXATION 2 LEVEL Left 07/28/2019   Procedure: LEFT-SIDED LUMBAR 4- 5, LUMBAR 5 -SACRUM1 TRANSFORAMINAL LUMBAR INTERBODY FUSION WITH INSTRUMENTATION AND ALLOGRAFT;  Surgeon: Estill Bamberg, MD;  Location: [facility];  Service: Orthopedics;  Laterality: Left;   TUBAL LIGATION     Patient Active Problem List   Diagnosis Date Noted   Acute non-recurrent maxillary sinusitis 10/26/2022   Mucopurulent conjunctivitis of both eyes 10/26/2022   Acute bronchitis 10/25/2022   Maxillary sinusitis, acute 10/25/2022   Conjunctivitis 10/25/2022   Neck pain, chronic 08/28/2022   Sleep apnea 08/28/2022   Need for immunization against influenza 02/28/2022   Screening for cervical cancer 12/21/2021   H. pylori infection 12/05/2021   Snoring 11/23/2021   Insomnia 11/23/2021   Annual physical exam 10/16/2021   Dermatitis 10/16/2021   Need for varicella vaccine 10/16/2021   Diarrhea 07/05/2021   Stomach cramps 07/05/2021   Nausea &  vomiting 07/05/2021   Encounter for vaccination 07/05/2021   Right foot pain 05/16/2021   Dysphagia 05/01/2021   Right shoulder pain 12/27/2020   Rash 12/13/2020   Chronic back pain 04/04/2020   History of back surgery 04/04/2020   Radiculopathy 07/28/2019   Borderline diabetes 12/25/2018   Hypertension 06/30/2018   S/P abdominal supracervical subtotal hysterectomy 11/06/2016   Trigger middle finger of right hand 04/24/2016   GERD (gastroesophageal reflux disease)  08/04/2014   Shoulder strain 07/26/2014   Degenerative arthritis of thumb 07/26/2014   Left knee pain 02/28/2014   Hip pain 04/15/2013   Epicondylitis, lateral (tennis elbow) 04/13/2013   Constipation 09/15/2012   Hyperlipidemia 11/28/2011   Obesity, Class I, BMI 30.0-34.9 (see actual BMI) 09/15/2011   ALLERGIC RHINITIS, SEASONAL 11/13/2007    PCP: Gilmore Laroche, FNP  REFERRING PROVIDER: Darreld Mclean, MD  REFERRING DIAG: M54.2 (ICD-10-CM) - Cervicalgia M54.2,G89.29 (ICD-10-CM) - Neck pain, chronic  THERAPY DIAG:  Neck pain  Rationale for Evaluation and Treatment: Rehabilitation  ONSET DATE: over a year  SUBJECTIVE:                                                                                                                                                                                                         SUBJECTIVE STATEMENT: Pt stated pain comes and goes, pain scale 4/10 Lt side and back of neck.  Stated she had MRI done on Saturday for Lt knee pain.  PERTINENT HISTORY:  Having some lower extremity issues Back fusion in 2021  PAIN:  Are you having pain? Yes: NPRS scale: 5/10 Pain location: neck Pain description: hurting Aggravating factors: turning it the way, extension Relieving factors: massage, muscle relaxer  PRECAUTIONS: None  WEIGHT BEARING RESTRICTIONS: No  FALLS:  Has patient fallen in last 6 months? No  OCCUPATION: work part time as a Conservation officer, [employer]  PLOF: Independent  PATIENT GOALS: get it right  NEXT MD VISIT: 12/03/22 PCP  OBJECTIVE:   DIAGNOSTIC FINDINGS:    Impression:  some degenerative changes of the cervical spine as noted, no acute findings.   Electronically Signed Darreld Mclean, MD 12/12/20232:32 PM  PATIENT SURVEYS:  FOTO 48  COGNITION: Overall cognitive status: Within functional limits for tasks assessed   POSTURE: rounded shoulders, forward head, and guarding  PALPATION: Tender bilateral upper traps and  levator   CERVICAL ROM:   Active ROM AROM (deg) eval  Flexion 12  Extension 14  Right lateral flexion 19  Left lateral flexion 20  Right rotation 31  Left rotation 36   (Blank rows =  not tested)  UPPER EXTREMITY ROM:  Active ROM Right eval Left eval Right 11/12/22 Left  11/12/22  Shoulder flexion ~100 deg ~90 deg* 146 deg 141 deg pain in tricep region  Shoulder extension      Shoulder abduction      Shoulder adduction      Shoulder extension      Shoulder internal rotation      Shoulder external rotation      Elbow flexion      Elbow extension      Wrist flexion      Wrist extension      Wrist ulnar deviation      Wrist radial deviation      Wrist pronation      Wrist supination       (Blank rows = not tested)  UPPER EXTREMITY MMT:  MMT Right eval Left eval  Shoulder flexion 4 4  Shoulder extension    Shoulder abduction    Shoulder adduction    Shoulder extension    Shoulder internal rotation    Shoulder external rotation    Middle trapezius    Lower trapezius    Elbow flexion 4+ 4+  Elbow extension 4+ 4+  Wrist flexion    Wrist extension    Wrist ulnar deviation    Wrist radial deviation    Wrist pronation    Wrist supination    Grip strength     (Blank rows = not tested)  TODAY'S TREATMENT:                                                                                                                              DATE:  11/12/22: Standing with back against wall: Cervical retraction 10x 5" UE flexion with SPC standing back against wall with chin tuck Wall slides 10x Wall arch 10x   Seated X to V 10x. Wback 10x ROM measurement RTB rows and shoulder extension 10x UBE 2' forward/2' backward L1  11/01/22: Sitting: Cervical stabilization with wand shoulder flexion x 10 Cervical stabilization with wand scapular protraction/retraction x 10 Cervical excursion Scapular retraction with cervical retraction x 10   Cervical isometric for SB and  extension 5 x each x 5"  Retro shoulder rolls Cervical excursion x 5   10/18/22 physical therapy evaluation and HEP instruction   PATIENT EDUCATION:  Education details: Patient educated on exam findings, POC, scope of PT, HEP, and what to expect next visit. Person educated: Patient Education method: Explanation, Demonstration, and Handouts Education comprehension: verbalized understanding, returned demonstration, verbal cues required, and tactile cues required   HOME EXERCISE PROGRAM:             11/01/22            - Seated Shoulder Flexion AAROM with Dowel  - 1 x daily - 7 x weekly - 1 sets - 10 reps - Seated Scapular Protraction and Retraction  - 1 x daily - 7  x weekly - 1 sets - 10 reps - 3-5 seconds  hold - Seated Isometric Cervical Extension  - 1 x daily - 7 x weekly - 1 sets - 5-10 reps - 3-5 hold - Seated Isometric Cervical Sidebending  - 1 x daily - 7 x weekly - 1 sets - 5-10 reps - 3-5  hold Access Code: [email or web address] URL: [email or web address] Date: 10/18/2022 Prepared by: [facility]  Exercises - Seated Cervical Rotation AROM  - 1 x daily - 7 x weekly - 3 sets - 10 reps - Seated Cervical Sidebending AROM  - 1 x daily - 7 x weekly - 3 sets - 10 reps - Seated Cervical Flexion AROM  - 1 x daily - 7 x weekly - 3 sets - 10 reps - Seated Cervical Extension AROM  - 1 x daily - 7 x weekly - 3 sets - 10 reps - Seated Cervical Retraction  - 1 x daily - 7 x weekly - 1 sets - 10 reps - Correct Seated Posture  - 1 x daily - 7 x weekly - 3 sets - 10 reps  ASSESSMENT:  CLINICAL IMPRESSION: Progressed cervical stabilization exercises with additional standing exercises incorporating shoulder motion.  Monitored pain through session with min cueing for core activation to reduce lordotic curvature.  Presents with improved Bil UE flexion ROM following standing and seated exercises.  Reports of increased fatigue with UE movements, EOS added UBE for activity tolerance and postural  strengthening.     OBJECTIVE IMPAIRMENTS: decreased activity tolerance, decreased endurance, decreased knowledge of condition, decreased mobility, difficulty walking, decreased ROM, decreased strength, hypomobility, increased fascial restrictions, impaired perceived functional ability, impaired flexibility, and pain.   ACTIVITY LIMITATIONS: carrying, lifting, bending, sitting, standing, sleeping, bathing, reach over head, hygiene/grooming, and caring for others  PARTICIPATION LIMITATIONS: meal prep, cleaning, laundry, driving, shopping, community activity, occupation, and yard work  [facility] POTENTIAL: Good  CLINICAL DECISION MAKING: Stable/uncomplicated  EVALUATION COMPLEXITY: Low   GOALS: Goals reviewed with patient? No  SHORT TERM GOALS: Target date: 11/01/2022  patient will be independent with initial HEP  Baseline:  Goal status: MET  2.  Patient will self report 50% improvement to improve tolerance for functional activity  Baseline:  Goal status: IN PROGRESS    LONG TERM GOALS: Target date: 11/15/2022  Patient will be independent in self management strategies to improve quality of life and functional outcomes.  Baseline:  Goal status: IN PROGRESS  2.  Patient will self report 50% improvement to improve tolerance for functional activity  Baseline:  Goal status: IN PROGRESS  3.  Patient will increased cervical mobility by 40 degrees throughout to improve ability to scan for traffic with driving Baseline: see above Goal status: MET  4.  Patient will improve FOTO score by 10 points   Baseline: 48 Goal status: IN PROGRESS  5.  Patient will increase  shoulder flexion MMTs to 4+ to 5/5 without pain to promote return to lifting milk out of the fridge without pain  Baseline:  Goal status: IN PROGRESS  6.  Patient will increased bilateral shoulder flexion to 140 degrees or better to be able to reach overhead to fix her hair Baseline: see above Goal status: IN  PROGRESS   PLAN:  PT FREQUENCY: 2x/week  PT DURATION: 4 weeks  PLANNED INTERVENTIONS: Therapeutic exercises, Therapeutic activity, Neuromuscular re-education, Balance training, Gait training, Patient/Family education, Joint manipulation, Joint mobilization, Stair training, Orthotic/Fit training, DME instructions, Aquatic Therapy, Dry Needling, Electrical stimulation, Spinal manipulation,  Spinal mobilization, Cryotherapy, Moist heat, Compression bandaging, scar mobilization, Splintting, Taping, Traction, Ultrasound, Ionotophoresis 4mg /ml Dexamethasone, and Manual therapy   PLAN FOR NEXT SESSION: UBE , progress cervical mobility, postural strength and upper extremity strength as able; manual as needed  Becky Sax, LPTA/CLT; CBIS [phone]  Juel Burrow, PTA 11/12/2022, 4:37 PM  4:37 PM, 11/12/22

## 2022-11-14 ENCOUNTER — Encounter (HOSPITAL_COMMUNITY): Payer: Self-pay

## 2022-11-14 ENCOUNTER — Ambulatory Visit (HOSPITAL_COMMUNITY): Payer: 59

## 2022-11-14 DIAGNOSIS — M542 Cervicalgia: Secondary | ICD-10-CM | POA: Diagnosis not present

## 2022-11-14 NOTE — Therapy (Addendum)
OUTPATIENT PHYSICAL THERAPY CERVICAL Treatment Progress Note Reporting Period 5/10 to 11/14/22  See note below for Objective Data and Assessment of Progress/Goals.      Patient Name: Jacqueline [name] MRN: [id number] DOB:11-19-1960, 62 y.o., female Today's Date: 11/14/2022  END OF SESSION:  END OF SESSION:   PT End of Session - 11/14/22 1508     Visit Number 4    Number of Visits 8    Date for PT Re-Evaluation 11/15/22    Authorization Type [employer]    PT Start Time 1451    PT Stop Time 1531    PT Time Calculation (min) 40 min    Activity Tolerance Patient tolerated treatment well    Behavior During Therapy [facility] for tasks assessed/performed                 Past Medical History:  Diagnosis Date   Anemia    Arthritis    per patient, back   Complication of anesthesia 2018   per patient HR dropped during partial hysterectomy surgery   Fibroid uterus    GERD (gastroesophageal reflux disease)    H/O total hysterectomy    Hyperlipidemia    Borderline   Hypertension    Numbness and tingling in right hand    Pre-diabetes    borderline   Past Surgical History:  Procedure Laterality Date   BACK SURGERY     BALLOON DILATION N/A 05/22/2021   Procedure: BALLOON DILATION;  Surgeon: Lanelle Bal, DO;  Location: [facility];  Service: Endoscopy;  Laterality: N/A;   CHOLECYSTECTOMY     COLONOSCOPY  2003   Dr. Jena Gauss: Anal canal hemorrhoids   COLONOSCOPY N/A 08/25/2014   Normal colonoscopy.  Next colonoscopy in 2026.  Procedure: COLONOSCOPY;  Surgeon: Corbin Ade, MD;  Location: [facility];  Service: Endoscopy;  Laterality: N/A;  1200   ESOPHAGOGASTRODUODENOSCOPY N/A 08/25/2014   Procedure: ESOPHAGOGASTRODUODENOSCOPY (EGD);  Surgeon: Corbin Ade, MD;  Location: [facility];  Service: Endoscopy;  Laterality: N/A;   ESOPHAGOGASTRODUODENOSCOPY (EGD) WITH PROPOFOL N/A 05/22/2021   Web and distal esophagus, dilated, gastriti, biopsy positive for mildly  active H. pylori gastritis.  s procedure: ESOPHAGOGASTRODUODENOSCOPY (EGD) WITH PROPOFOL;  Surgeon: Lanelle Bal, DO;  Location: [facility];  Service: Endoscopy;  Laterality: N/A;  9:15am   SALPINGOOPHORECTOMY Bilateral 11/06/2016   Procedure: BILATERAL SALPINGO OOPHORECTOMY;  Surgeon: Lazaro Arms, MD;  Location: [facility];  Service: Gynecology;  Laterality: Bilateral;   SUPRACERVICAL ABDOMINAL HYSTERECTOMY N/A 11/06/2016   Procedure: HYSTERECTOMY SUPRACERVICAL ABDOMINAL;  Surgeon: Lazaro Arms, MD;  Location: [facility];  Service: Gynecology;  Laterality: N/A;   TRANSFORAMINAL LUMBAR INTERBODY FUSION (TLIF) WITH PEDICLE SCREW FIXATION 2 LEVEL Left 07/28/2019   Procedure: LEFT-SIDED LUMBAR 4- 5, LUMBAR 5 -SACRUM1 TRANSFORAMINAL LUMBAR INTERBODY FUSION WITH INSTRUMENTATION AND ALLOGRAFT;  Surgeon: Estill Bamberg, MD;  Location: [facility];  Service: Orthopedics;  Laterality: Left;   TUBAL LIGATION     Patient Active Problem List   Diagnosis Date Noted   Acute non-recurrent maxillary sinusitis 10/26/2022   Mucopurulent conjunctivitis of both eyes 10/26/2022   Acute bronchitis 10/25/2022   Maxillary sinusitis, acute 10/25/2022   Conjunctivitis 10/25/2022   Neck pain, chronic 08/28/2022   Sleep apnea 08/28/2022   Need for immunization against influenza 02/28/2022   Screening for cervical cancer 12/21/2021   H. pylori infection 12/05/2021   Snoring 11/23/2021   Insomnia 11/23/2021   Annual physical exam 10/16/2021  Dermatitis 10/16/2021   Need for varicella vaccine 10/16/2021   Diarrhea 07/05/2021   Stomach cramps 07/05/2021   Nausea & vomiting 07/05/2021   Encounter for vaccination 07/05/2021   Right foot pain 05/16/2021   Dysphagia 05/01/2021   Right shoulder pain 12/27/2020   Rash 12/13/2020   Chronic back pain 04/04/2020   History of back surgery 04/04/2020   Radiculopathy 07/28/2019   Borderline diabetes 12/25/2018   Hypertension 06/30/2018   S/P abdominal supracervical  subtotal hysterectomy 11/06/2016   Trigger middle finger of right hand 04/24/2016   GERD (gastroesophageal reflux disease) 08/04/2014   Shoulder strain 07/26/2014   Degenerative arthritis of thumb 07/26/2014   Left knee pain 02/28/2014   Hip pain 04/15/2013   Epicondylitis, lateral (tennis elbow) 04/13/2013   Constipation 09/15/2012   Hyperlipidemia 11/28/2011   Obesity, Class I, BMI 30.0-34.9 (see actual BMI) 09/15/2011   ALLERGIC RHINITIS, SEASONAL 11/13/2007    PCP: Gilmore Laroche, FNP  REFERRING PROVIDER: Darreld Mclean, MD  REFERRING DIAG: M54.2 (ICD-10-CM) - Cervicalgia M54.2,G89.29 (ICD-10-CM) - Neck pain, chronic  THERAPY DIAG:  Neck pain  Rationale for Evaluation and Treatment: Rehabilitation  ONSET DATE: over a year  SUBJECTIVE:                                                                                                                                                                                                         SUBJECTIVE STATEMENT: Pt stated pain comes and goes, pain scale 4/10 Lt side and back of neck.  Had MRI done on Lt knee Saturday, gets results tomorrow.  Stated knee feels like it's going to catch on her.    PERTINENT HISTORY:  Having some lower extremity issues Back fusion in 2021  PAIN:  Are you having pain? Yes: NPRS scale: 4/10 Pain location: neck Pain description: hurting Aggravating factors: turning it the way, extension Relieving factors: massage, muscle relaxer  PRECAUTIONS: None  WEIGHT BEARING RESTRICTIONS: No  FALLS:  Has patient fallen in last 6 months? No  OCCUPATION: work part time as a Conservation officer, [employer]  PLOF: Independent  PATIENT GOALS: get it right  NEXT MD VISIT: 12/03/22 PCP  OBJECTIVE:   DIAGNOSTIC FINDINGS:    Impression:  some degenerative changes of the cervical spine as noted, no acute findings.   Electronically Signed Darreld Mclean, MD 12/12/20232:32 PM  PATIENT SURVEYS:  FOTO 48 11/14/22:  45  COGNITION: Overall cognitive status: Within functional limits for tasks assessed   POSTURE: rounded shoulders, forward head, and guarding  PALPATION: Tender bilateral upper traps and levator   CERVICAL  ROM:   Active ROM AROM (deg) eval AROM  11/14/22  Flexion 12 15  Extension 14 16  Right lateral flexion 19 20  Left lateral flexion 20 22  Right rotation 31 30  Left rotation 36 38   (Blank rows = not tested)  UPPER EXTREMITY ROM:  Active ROM Right eval Left eval Right 11/12/22 Left  11/12/22  Shoulder flexion ~100 deg ~90 deg* 146 deg 141 deg pain in tricep region  Shoulder extension      Shoulder abduction      Shoulder adduction      Shoulder extension      Shoulder internal rotation      Shoulder external rotation      Elbow flexion      Elbow extension      Wrist flexion      Wrist extension      Wrist ulnar deviation      Wrist radial deviation      Wrist pronation      Wrist supination       (Blank rows = not tested)  UPPER EXTREMITY MMT:  MMT Right eval Left eval Right 11/14/22 Left 11/14/22  Shoulder flexion 4 4 4/5 pulling lateral shoulder 4+  Shoulder extension      Shoulder abduction   4+ 4+  Shoulder adduction      Shoulder extension   4+ 4+   Shoulder internal rotation      Shoulder external rotation      Middle trapezius      Lower trapezius      Elbow flexion 4+ 4+ 5/5 5/5  Elbow extension 4+ 4+ 5/5 5/5  Wrist flexion      Wrist extension      Wrist ulnar deviation      Wrist radial deviation      Wrist pronation      Wrist supination      Grip strength       (Blank rows = not tested)  TODAY'S TREATMENT:                                                                                                                              DATE:  11/14/22: UBE 2' forward/2' backward L1 FOTO- 45 was 48 ROM measurements- see above MMT- see above 3D cervical excursion Chin tucks Reviewed goals Manual supine STM to UT and neck mm, PROM all  directions  11/12/22: Standing with back against wall: Cervical retraction 10x 5" UE flexion with SPC standing back against wall with chin tuck Wall slides 10x Wall arch 10x   Seated X to V 10x. Wback 10x ROM measurement RTB rows and shoulder extension 10x UBE 2' forward/2' backward L1  11/01/22: Sitting: Cervical stabilization with wand shoulder flexion x 10 Cervical stabilization with wand scapular protraction/retraction x 10 Cervical excursion Scapular retraction with cervical retraction x 10   Cervical isometric for SB and extension 5 x each x 5"  Retro shoulder rolls Cervical excursion x 5   10/18/22 physical therapy evaluation and HEP instruction   PATIENT EDUCATION:  Education details: Patient educated on exam findings, POC, scope of PT, HEP, and what to expect next visit. Person educated: Patient Education method: Explanation, Demonstration, and Handouts Education comprehension: verbalized understanding, returned demonstration, verbal cues required, and tactile cues required   HOME EXERCISE PROGRAM:             11/01/22            - Seated Shoulder Flexion AAROM with Dowel  - 1 x daily - 7 x weekly - 1 sets - 10 reps - Seated Scapular Protraction and Retraction  - 1 x daily - 7 x weekly - 1 sets - 10 reps - 3-5 seconds  hold - Seated Isometric Cervical Extension  - 1 x daily - 7 x weekly - 1 sets - 5-10 reps - 3-5 hold - Seated Isometric Cervical Sidebending  - 1 x daily - 7 x weekly - 1 sets - 5-10 reps - 3-5  hold Access Code: [email or web address] URL: [email or web address] Date: 10/18/2022 Prepared by: [facility]  Exercises - Seated Cervical Rotation AROM  - 1 x daily - 7 x weekly - 3 sets - 10 reps - Seated Cervical Sidebending AROM  - 1 x daily - 7 x weekly - 3 sets - 10 reps - Seated Cervical Flexion AROM  - 1 x daily - 7 x weekly - 3 sets - 10 reps - Seated Cervical Extension AROM  - 1 x daily - 7 x weekly - 3 sets - 10 reps - Seated Cervical Retraction   - 1 x daily - 7 x weekly - 1 sets - 10 reps - Correct Seated Posture  - 1 x daily - 7 x weekly - 3 sets - 10 reps  ASSESSMENT:  CLINICAL IMPRESSION: Reviewed goals with the following findings:  Pt reports compliance with HEP and feels she has improved by 70% since beginning therapy though reduced self perceived score on FOTO survey.  Presents with minimal improvements with cervical mobility though Bil UE have made great improvements with ROM.  Strength good with BUE.  Pt limited by activity tolerance overhead and reports of pullling tight musculature when trying to do hair.  EOS with manual to address restrictions for cervical mobility with PROM.  Encouraged increased hydration following manual as does have moderate tightness/spasms Bil UT Lt>Rt.  Pt will continue to benefit from skilled intervention to improve cervical mobility, pain control, and UE strength overhead to increase ADLs.     OBJECTIVE IMPAIRMENTS: decreased activity tolerance, decreased endurance, decreased knowledge of condition, decreased mobility, difficulty walking, decreased ROM, decreased strength, hypomobility, increased fascial restrictions, impaired perceived functional ability, impaired flexibility, and pain.   ACTIVITY LIMITATIONS: carrying, lifting, bending, sitting, standing, sleeping, bathing, reach over head, hygiene/grooming, and caring for others  PARTICIPATION LIMITATIONS: meal prep, cleaning, laundry, driving, shopping, community activity, occupation, and yard work  [facility] POTENTIAL: Good  CLINICAL DECISION MAKING: Stable/uncomplicated  EVALUATION COMPLEXITY: Low   GOALS: Goals reviewed with patient? No  SHORT TERM GOALS: Target date: 11/01/2022  patient will be independent with initial HEP Baseline: 11/14/22:  Reports compliance with HEP daily Goal status: MET  2.  Patient will self report 50% improvement to improve tolerance for functional activity  Baseline: 11/14/22:  70% improvement Goal status: IN  PROGRESS    LONG TERM GOALS: Target date: 11/15/2022  Patient will be independent in self management  strategies to improve quality of life and functional outcomes.  Baseline:  Goal status: IN PROGRESS  2.  Patient will self report 50% improvement to improve tolerance for functional activity  Baseline: 11/14/22:  70% improvement Goal status: IN PROGRESS  3.  Patient will increased cervical mobility by 40 degrees throughout to improve ability to scan for traffic with driving Baseline: see above Goal status: MET  4.  Patient will improve FOTO score by 10 points Baseline: 48; 11/14/22:  45% Goal status: IN PROGRESS  5.  Patient will increase  shoulder flexion MMTs to 4+ to 5/5 without pain to promote return to lifting milk out of the fridge without pain  Baseline: see above Goal status: MET  6.  Patient will increased bilateral shoulder flexion to 140 degrees or better to be able to reach overhead to fix her hair Baseline: see above, 11/14/22: see above, ROM goal met reports she continues to have difficulty reaching overhead due to fatigue and some pulling Goal status: IN PROGRESS   PLAN:  PT FREQUENCY: 2x/week  PT DURATION: 4 weeks  PLANNED INTERVENTIONS: Therapeutic exercises, Therapeutic activity, Neuromuscular re-education, Balance training, Gait training, Patient/Family education, Joint manipulation, Joint mobilization, Stair training, Orthotic/Fit training, DME instructions, Aquatic Therapy, Dry Needling, Electrical stimulation, Spinal manipulation, Spinal mobilization, Cryotherapy, Moist heat, Compression bandaging, scar mobilization, Splintting, Taping, Traction, Ultrasound, Ionotophoresis 4mg /ml Dexamethasone, and Manual therapy   PLAN FOR NEXT SESSION: UBE and overhead tasks, progress cervical mobility, postural strength and upper extremity strength as able; manual as needed  Becky Sax, LPTA/CLT; CBIS [phone]  Juel Burrow, PTA 11/14/2022, 4:35  PM  4:35 PM, 11/14/22

## 2022-11-15 ENCOUNTER — Encounter: Payer: Self-pay | Admitting: Orthopedic Surgery

## 2022-11-15 ENCOUNTER — Ambulatory Visit (INDEPENDENT_AMBULATORY_CARE_PROVIDER_SITE_OTHER): Payer: 59 | Admitting: Orthopedic Surgery

## 2022-11-15 DIAGNOSIS — M25462 Effusion, left knee: Secondary | ICD-10-CM

## 2022-11-15 DIAGNOSIS — G8929 Other chronic pain: Secondary | ICD-10-CM

## 2022-11-15 DIAGNOSIS — S83242A Other tear of medial meniscus, current injury, left knee, initial encounter: Secondary | ICD-10-CM | POA: Diagnosis not present

## 2022-11-15 NOTE — Progress Notes (Signed)
   This is a follow-up appointment  Encounter Diagnoses  Name Primary?   Acute medial meniscus tear of left knee, initial encounter Yes   Effusion of knee joint, left    Chronic pain of left knee      Chief Complaint  Patient presents with   Follow-up    Recheck on neck pain with MRI review.    The patient did have her MRI there is no preliminary report I reviewed it myself she has osteoarthritis of the lateral compartment she has a torn medial meniscus she has fluid in the joint  I discussed this with her at length.  She says that her knee is better with the bracing and the previous treatment but is still not good enough for her to get out of the brace and return to normal walking and get rid of her cane  She would like to proceed with surgical intervention with arthroscopy and partial medial meniscectomy  Elective surgery  6 weeks out of work  Surgery will take about 40 minutes  I will see her on July 11 for preop visit  She wants to have the surgery on July 17

## 2022-11-18 ENCOUNTER — Encounter (HOSPITAL_COMMUNITY): Payer: Self-pay | Admitting: Internal Medicine

## 2022-11-18 ENCOUNTER — Ambulatory Visit (HOSPITAL_COMMUNITY)
Admission: RE | Admit: 2022-11-18 | Discharge: 2022-11-18 | Disposition: A | Discharge status: Home / Self Care | Payer: 59 | Attending: Internal Medicine | Admitting: Internal Medicine

## 2022-11-18 ENCOUNTER — Ambulatory Visit (HOSPITAL_BASED_OUTPATIENT_CLINIC_OR_DEPARTMENT_OTHER): Payer: 59 | Admitting: Anesthesiology

## 2022-11-18 ENCOUNTER — Other Ambulatory Visit: Payer: Self-pay

## 2022-11-18 ENCOUNTER — Ambulatory Visit (HOSPITAL_COMMUNITY): Payer: 59 | Admitting: Anesthesiology

## 2022-11-18 ENCOUNTER — Encounter (HOSPITAL_COMMUNITY)
Admission: RE | Disposition: A | Discharge status: Home / Self Care | Payer: Self-pay | Source: Home / Self Care | Attending: Internal Medicine

## 2022-11-18 ENCOUNTER — Telehealth: Payer: Self-pay | Admitting: Radiology

## 2022-11-18 DIAGNOSIS — G8929 Other chronic pain: Secondary | ICD-10-CM

## 2022-11-18 DIAGNOSIS — M25462 Effusion, left knee: Secondary | ICD-10-CM

## 2022-11-18 DIAGNOSIS — K2970 Gastritis, unspecified, without bleeding: Secondary | ICD-10-CM | POA: Diagnosis not present

## 2022-11-18 DIAGNOSIS — G4730 Sleep apnea, unspecified: Secondary | ICD-10-CM | POA: Diagnosis not present

## 2022-11-18 DIAGNOSIS — K222 Esophageal obstruction: Secondary | ICD-10-CM | POA: Diagnosis not present

## 2022-11-18 DIAGNOSIS — S83242A Other tear of medial meniscus, current injury, left knee, initial encounter: Secondary | ICD-10-CM

## 2022-11-18 DIAGNOSIS — K219 Gastro-esophageal reflux disease without esophagitis: Secondary | ICD-10-CM | POA: Diagnosis not present

## 2022-11-18 DIAGNOSIS — K449 Diaphragmatic hernia without obstruction or gangrene: Secondary | ICD-10-CM

## 2022-11-18 DIAGNOSIS — B9681 Helicobacter pylori [H. pylori] as the cause of diseases classified elsewhere: Secondary | ICD-10-CM | POA: Insufficient documentation

## 2022-11-18 DIAGNOSIS — I10 Essential (primary) hypertension: Secondary | ICD-10-CM | POA: Insufficient documentation

## 2022-11-18 DIAGNOSIS — K2950 Unspecified chronic gastritis without bleeding: Secondary | ICD-10-CM | POA: Diagnosis not present

## 2022-11-18 DIAGNOSIS — R1314 Dysphagia, pharyngoesophageal phase: Secondary | ICD-10-CM | POA: Diagnosis not present

## 2022-11-18 DIAGNOSIS — M1990 Unspecified osteoarthritis, unspecified site: Secondary | ICD-10-CM | POA: Insufficient documentation

## 2022-11-18 DIAGNOSIS — D649 Anemia, unspecified: Secondary | ICD-10-CM | POA: Insufficient documentation

## 2022-11-18 DIAGNOSIS — R1310 Dysphagia, unspecified: Secondary | ICD-10-CM

## 2022-11-18 HISTORY — PX: BIOPSY: SHX5522

## 2022-11-18 HISTORY — PX: MALONEY DILATION: SHX5535

## 2022-11-18 HISTORY — PX: ESOPHAGOGASTRODUODENOSCOPY (EGD) WITH PROPOFOL: SHX5813

## 2022-11-18 SURGERY — ESOPHAGOGASTRODUODENOSCOPY (EGD) WITH PROPOFOL
Anesthesia: General

## 2022-11-18 MED ORDER — LIDOCAINE HCL (CARDIAC) PF 100 MG/5ML IV SOSY
PREFILLED_SYRINGE | INTRAVENOUS | Status: DC | PRN
  Administered 2022-11-18: 50 mg via INTRAVENOUS

## 2022-11-18 MED ORDER — PROPOFOL 500 MG/50ML IV EMUL
INTRAVENOUS | Status: DC | PRN
  Administered 2022-11-18: 150 ug/kg/min via INTRAVENOUS

## 2022-11-18 MED ORDER — PROPOFOL 10 MG/ML IV BOLUS
INTRAVENOUS | Status: DC | PRN
  Administered 2022-11-18: 50 mg via INTRAVENOUS
  Administered 2022-11-18: 100 mg via INTRAVENOUS
  Administered 2022-11-18: 50 mg via INTRAVENOUS

## 2022-11-18 MED ORDER — LACTATED RINGERS IV SOLN
INTRAVENOUS | Status: DC
  Administered 2022-11-18: 1000 mL via INTRAVENOUS

## 2022-11-18 NOTE — Addendum Note (Signed)
Addended byWilhemena Durie S on: 11/18/2022 09:27 AM   Modules accepted: Orders

## 2022-11-18 NOTE — Telephone Encounter (Signed)
-----   Message from Vickki Hearing, MD sent at 11/18/2022  8:51 AM EDT ----- Regarding: RE: Change it then ----- Message ----- From: Caffie Damme, RT Sent: 11/18/2022   8:49 AM EDT To: Vickki Hearing, MD  You are off that week ??? I have you listed off 15th through 19th  ----- Message ----- From: Vickki Hearing, MD Sent: 11/15/2022  12:07 PM EDT To: Caffie Damme, RT  Schedule for July 17 [facility] men   She s coming in July 11th to the office for preop h/p

## 2022-11-18 NOTE — Anesthesia Preprocedure Evaluation (Addendum)
Anesthesia Evaluation  Patient identified by MRN, date of birth, ID band Patient awake    Reviewed: Allergy & Precautions, H&P , NPO status , Patient's Chart, lab work & pertinent test results  History of Anesthesia Complications (+) history of anesthetic complications (low blood pressure, bradycardia)  Airway Mallampati: II  TM Distance: >3 FB Neck ROM: Full    Dental no notable dental hx. (+) Missing, Chipped, Dental Advisory Given,    Pulmonary sleep apnea    Pulmonary exam normal breath sounds clear to auscultation       Cardiovascular Exercise Tolerance: Good hypertension, Pt. on medications Normal cardiovascular exam Rhythm:Regular Rate:Normal     Neuro/Psych  Neuromuscular disease  negative psych ROS   GI/Hepatic Neg liver ROS,GERD  Medicated and Poorly Controlled,,  Endo/Other  negative endocrine ROS    Renal/GU negative Renal ROS  negative genitourinary   Musculoskeletal  (+) Arthritis , Osteoarthritis,    Abdominal   Peds negative pediatric ROS (+)  Hematology  (+) Blood dyscrasia, anemia   Anesthesia Other Findings   Reproductive/Obstetrics negative OB ROS                             Anesthesia Physical Anesthesia Plan  ASA: 2  Anesthesia Plan: General   Post-op Pain Management: Minimal or no pain anticipated   Induction: Intravenous  PONV Risk Score and Plan: Treatment may vary due to age or medical condition and Propofol infusion  Airway Management Planned: Nasal Cannula and Natural Airway  Additional Equipment:   Intra-op Plan:   Post-operative Plan:   Informed Consent: I have reviewed the patients History and Physical, chart, labs and discussed the procedure including the risks, benefits and alternatives for the proposed anesthesia with the patient or authorized representative who has indicated his/her understanding and acceptance.     Dental advisory  given  Plan Discussed with: CRNA and Surgeon  Anesthesia Plan Comments:        Anesthesia Quick Evaluation

## 2022-11-18 NOTE — Op Note (Signed)
[facility] Patient Name: Jacqueline [name] Procedure Date: 11/18/2022 1:11 PM MRN: [id number] Date of Birth: 02/11/1961 Attending MD: Gennette Pac , MD, [id number] CSN: [id number] Age: 62 Admit Type: Outpatient Procedure:                Upper GI endoscopy Indications:              Dysphagia Providers:                Gennette Pac, MD, Jannett Celestine, RN, Dyann Ruddle Referring MD:              Medicines:                Propofol per Anesthesia Complications:            No immediate complications. Estimated Blood Loss:     Estimated blood loss was minimal. Procedure:                Pre-Anesthesia Assessment:                           - Prior to the procedure, a History and Physical                            was performed, and patient medications and                            allergies were reviewed. The patient's tolerance of                            previous anesthesia was also reviewed. The risks                            and benefits of the procedure and the sedation                            options and risks were discussed with the patient.                            All questions were answered, and informed consent                            was obtained. Prior Anticoagulants: The patient has                            taken no anticoagulant or antiplatelet agents. ASA                            Grade Assessment: III - A patient with severe                            systemic disease. After reviewing the risks and  benefits, the patient was deemed in satisfactory                            condition to undergo the procedure.                           After obtaining informed consent, the endoscope was                            passed under direct vision. Throughout the                            procedure, the patient's blood pressure, pulse, and                            oxygen saturations were monitored  continuously. The                            [id number]-[id number] ([id number]) scope was introduced through the                            mouth, and advanced to the second part of duodenum.                            The upper GI endoscopy was accomplished without                            difficulty. The patient tolerated the procedure                            well. Scope In: 1:40:18 PM Scope Out: 1:48:47 PM Total Procedure Duration: 0 hours 8 minutes 29 seconds  Findings:      A mild Schatzki ring was found at the gastroesophageal junction. No       esophagitis. No nodule.      A small hiatal hernia was present. Tiny antral erosions present. No       ulcer or infiltrating process. Pylorus patent.      The duodenal bulb and second portion of the duodenum were normal. The       scope was withdrawn. Dilation was performed with a Maloney dilator with       mild resistance at 54 Fr. The dilation site was examined following       endoscope reinsertion and showed moderate mucosal disruption. Estimated       blood loss was minimal. Finally biopsies of the gastric mucosa were       taken for histologic study and culture and sensitivities for H. pylori Impression:               - Mild Schatzki ring. Dilated.                           - Small hiatal hernia.                           - Normal duodenal bulb and second portion of the  duodenum.                           -Gastric mucosal biopsies taken Moderate Sedation:      Moderate (conscious) sedation was personally administered by an       anesthesia professional. The following parameters were monitored: oxygen       saturation, heart rate, blood pressure, respiratory rate, EKG, adequacy       of pulmonary ventilation, and response to care. Recommendation:           - Patient has a contact number available for                            emergencies. The signs and symptoms of potential                            delayed  complications were discussed with the                            patient. Return to normal activities tomorrow.                            Written discharge instructions were provided to the                            patient.                           - Advance diet as tolerated.                           - Continue present medications. Follow-up on lab                            results. Lab specifically notified of tissue sample                            submitted for H. pylori culture and antibody                            testing.                           - Return to my office in 3 months. Procedure Code(s):        --- Professional ---                           [phone], Esophagogastroduodenoscopy, flexible,                            transoral; diagnostic, including collection of                            specimen(s) by brushing or washing, when performed                            (separate procedure)  43450, Dilation of esophagus, by unguided sound or                            bougie, single or multiple passes Diagnosis Code(s):        --- Professional ---                           K22.2, Esophageal obstruction                           K44.9, Diaphragmatic hernia without obstruction or                            gangrene                           R13.10, Dysphagia, unspecified CPT copyright 2022 [employer]. All rights reserved. The codes documented in this report are preliminary and upon coder review may  be revised to meet current compliance requirements. Jacqueline Friends. Milarose Savich, MD Gennette Pac, MD 11/18/2022 2:02:57 PM This report has been signed electronically. Number of Addenda: 0

## 2022-11-18 NOTE — H&P (Signed)
@LOGO @   Primary Care Physician:  Gilmore Laroche, FNP Primary Gastroenterologist:  Dr. Jena Gauss  Pre-Procedure History & Physical: HPI:  Jacqueline [name] is a 62 y.o. female here for   Further evaluation of recurrent esophageal dysphagia in the setting of longstanding GERD and known distal esophageal web dilated 2 years ago.  Past Medical History:  Diagnosis Date   Anemia    Arthritis    per patient, back   Complication of anesthesia 2018   per patient HR dropped during partial hysterectomy surgery   Fibroid uterus    GERD (gastroesophageal reflux disease)    H/O total hysterectomy    Hyperlipidemia    Borderline   Hypertension    Numbness and tingling in right hand    Pre-diabetes    borderline    Past Surgical History:  Procedure Laterality Date   BACK SURGERY     BALLOON DILATION N/A 05/22/2021   Procedure: BALLOON DILATION;  Surgeon: Lanelle Bal, DO;  Location: [facility];  Service: Endoscopy;  Laterality: N/A;   CHOLECYSTECTOMY     COLONOSCOPY  2003   Dr. Jena Gauss: Anal canal hemorrhoids   COLONOSCOPY N/A 08/25/2014   Normal colonoscopy.  Next colonoscopy in 2026.  Procedure: COLONOSCOPY;  Surgeon: Corbin Ade, MD;  Location: [facility];  Service: Endoscopy;  Laterality: N/A;  1200   ESOPHAGOGASTRODUODENOSCOPY N/A 08/25/2014   Procedure: ESOPHAGOGASTRODUODENOSCOPY (EGD);  Surgeon: Corbin Ade, MD;  Location: [facility];  Service: Endoscopy;  Laterality: N/A;   ESOPHAGOGASTRODUODENOSCOPY (EGD) WITH PROPOFOL N/A 05/22/2021   Web and distal esophagus, dilated, gastriti, biopsy positive for mildly active H. pylori gastritis.  s procedure: ESOPHAGOGASTRODUODENOSCOPY (EGD) WITH PROPOFOL;  Surgeon: Lanelle Bal, DO;  Location: [facility];  Service: Endoscopy;  Laterality: N/A;  9:15am   SALPINGOOPHORECTOMY Bilateral 11/06/2016   Procedure: BILATERAL SALPINGO OOPHORECTOMY;  Surgeon: Lazaro Arms, MD;  Location: [facility];  Service: Gynecology;  Laterality:  Bilateral;   SUPRACERVICAL ABDOMINAL HYSTERECTOMY N/A 11/06/2016   Procedure: HYSTERECTOMY SUPRACERVICAL ABDOMINAL;  Surgeon: Lazaro Arms, MD;  Location: [facility];  Service: Gynecology;  Laterality: N/A;   TRANSFORAMINAL LUMBAR INTERBODY FUSION (TLIF) WITH PEDICLE SCREW FIXATION 2 LEVEL Left 07/28/2019   Procedure: LEFT-SIDED LUMBAR 4- 5, LUMBAR 5 -SACRUM1 TRANSFORAMINAL LUMBAR INTERBODY FUSION WITH INSTRUMENTATION AND ALLOGRAFT;  Surgeon: Estill Bamberg, MD;  Location: [facility];  Service: Orthopedics;  Laterality: Left;   TUBAL LIGATION      Prior to Admission medications   Medication Sig Start Date End Date Taking? Authorizing Provider  acetaminophen (TYLENOL) 650 MG CR tablet Take 650 mg by mouth every 8 (eight) hours as needed for pain.   Yes [provider]  acyclovir (ZOVIRAX) 400 MG tablet Take 400 mg by mouth 3 (three) times daily as needed (fever blisters).   Yes [provider]  amLODipine (NORVASC) 5 MG tablet Take 1 tablet by mouth once daily 11/05/22  Yes Gilmore Laroche, FNP  atorvastatin (LIPITOR) 20 MG tablet Take 1 tablet by mouth once daily 11/11/22  Yes Gilmore Laroche, FNP  azelastine (ASTELIN) 0.1 % nasal spray Place 1 spray into both nostrils 2 (two) times daily as needed for rhinitis. Use in each nostril as directed   Yes [provider]  benzonatate (TESSALON PERLES) 100 MG capsule Take 1 capsule (100 mg total) by mouth 3 (three) times daily as needed for cough. 10/25/22  Yes Kerri Perches, MD  clotrimazole-betamethasone (LOTRISONE) cream Apply 1 Application topically 2 (  two) times daily as needed (rash).   Yes [provider]  diclofenac (VOLTAREN) 75 MG EC tablet TAKE 1 TABLET BY MOUTH TWICE DAILY WITH A MEAL 09/09/22  Yes Darreld Mclean, MD  DULoxetine (CYMBALTA) 30 MG capsule Take 1 capsule (30 mg total) by mouth daily. Patient taking differently: Take 30 mg by mouth at bedtime as needed. 09/09/22  Yes Gardenia Phlegm, MD  estradiol  (ESTRACE) 0.1 MG/GM vaginal cream INSERT 2 GRAMS VAGINALLY AS DIRECTED EVERY OTHER NIGHT 04/10/22  Yes Lazaro Arms, MD  fluticasone (FLONASE) 50 MCG/ACT nasal spray Place 1 spray into both nostrils daily. Patient taking differently: Place 1 spray into both nostrils daily as needed for allergies. 07/02/22  Yes Valentino Nose, NP  methocarbamol (ROBAXIN) 500 MG tablet TAKE 1 TABLET BY MOUTH EVERY SIX HOURS AS NEEDED FOR CHRONIC BACK PAIN. 10/25/22  Yes Gilmore Laroche, FNP  Multiple Vitamin (MULTIVITAMIN WITH MINERALS) TABS tablet Take 1 tablet by mouth daily with supper. One-A-Day Multivitamin   Yes [provider]  pantoprazole (PROTONIX) 40 MG tablet Take 1 tablet (40 mg total) by mouth 2 (two) times daily before a meal. 09/27/22  Yes Gelene Mink, NP  traZODone (DESYREL) 50 MG tablet Take 0.5-1 tablets (25-50 mg total) by mouth at bedtime as needed for sleep. 03/22/22  Yes Gilmore Laroche, FNP  fluconazole (DIFLUCAN) 150 MG tablet Take one tablet by mouth once daily as needed for vaginal itch 10/25/22   Kerri Perches, MD  penicillin v potassium (VEETID) 500 MG tablet Take 1 tablet (500 mg total) by mouth 3 (three) times daily. 10/25/22   Kerri Perches, MD  sennosides-docusate sodium (SENOKOT-S) 8.6-50 MG tablet Take 1 tablet by mouth daily as needed for constipation. 09/09/22   Gardenia Phlegm, MD    Allergies as of 10/16/2022   (No Known Allergies)    Family History  Problem Relation Age of Onset   Breast cancer Maternal Aunt    Hypertension Maternal Aunt    Cancer Maternal Aunt        Breast   Cancer Maternal Grandmother         lymphnodes   Colon cancer Neg Hx     Social History   Socioeconomic History   Marital status: Married    Spouse name: Not on file   Number of children: 3   Years of education: Not on file   Highest education level: Not on file  Occupational History   Occupation: PT at [employer]  Tobacco Use   Smoking status: Never   Smokeless  tobacco: Never   Tobacco comments:    Never smoked  Vaping Use   Vaping Use: Never used  Substance and Sexual Activity   Alcohol use: No    Alcohol/week: 0.0 standard drinks of alcohol   Drug use: No   Sexual activity: Yes    Birth control/protection: Post-menopausal, Surgical  Other Topics Concern   Not on file  Social History Narrative   Lives with her spouse, work part at [employer]    Social Determinants of Health   Financial Resource Strain: Not on file  Food Insecurity: Not on file  Transportation Needs: Not on file  Physical Activity: Not on file  Stress: Not on file  Social Connections: Not on file  Intimate Partner Violence: Not on file    Review of Systems: See HPI, otherwise negative ROS  Physical Exam: BP 130/72   Pulse 74   Temp 98.5 F (  36.9 C) (Oral)   Resp 15   Wt 81.6 kg   LMP 10/26/2016 (Exact Date)   SpO2 98%   BMI 31.89 kg/m  General:   Alert,  Well-developed, well-nourished, pleasant and cooperative in NAD Neck:  Supple; no masses or thyromegaly. No significant cervical adenopathy. Lungs:  Clear throughout to auscultation.   No wheezes, crackles, or rhonchi. No acute distress. Heart:  Regular rate and rhythm; no murmurs, clicks, rubs,  or gallops. Abdomen: Non-distended, normal bowel sounds.  Soft and nontender without appreciable mass or hepatosplenomegaly.   Impression/Plan:    62 year old lady with longstanding GERD history of a distal esophageal web presents now with recurrent esophageal dysphagia.    History of Salvage H. pylori therapy due to noncompliance with initial regimen.  No documentation of eradication.  I will offer the patient EGD as with dilation as feasible/appropriate per plan we will also plan to do mucosal biopsy assess for eradication of H. pylori.  The risks, benefits, limitations, alternatives and imponderables have been reviewed with the patient. Potential for esophageal dilation, biopsy, etc. have also been reviewed.   Questions have been answered. All parties agreeable.      Notice: This dictation was prepared with Dragon dictation along with smaller phrase technology. Any transcriptional errors that result from this process are unintentional and may not be corrected upon review.

## 2022-11-18 NOTE — Anesthesia Procedure Notes (Signed)
Date/Time: 11/18/2022 1:41 PM  Performed by: Julian Reil, CRNAPre-anesthesia Checklist: Patient identified, Emergency Drugs available, Suction available and Patient being monitored Patient Re-evaluated:Patient Re-evaluated prior to induction Oxygen Delivery Method: Nasal cannula Induction Type: IV induction Placement Confirmation: positive ETCO2

## 2022-11-18 NOTE — Discharge Instructions (Addendum)
EGD Discharge instructions Please read the instructions outlined below and refer to this sheet in the next few weeks. These discharge instructions provide you with general information on caring for yourself after you leave the hospital. Your doctor may also give you specific instructions. While your treatment has been planned according to the most current medical practices available, unavoidable complications occasionally occur. If you have any problems or questions after discharge, please call your doctor. ACTIVITY You may resume your regular activity but move at a slower pace for the next 24 hours.  Take frequent rest periods for the next 24 hours.  Walking will help expel (get rid of) the air and reduce the bloated feeling in your abdomen.  No driving for 24 hours (because of the anesthesia (medicine) used during the test).  You may shower.  Do not sign any important legal documents or operate any machinery for 24 hours (because of the anesthesia used during the test).  NUTRITION Drink plenty of fluids.  You may resume your normal diet.  Begin with a light meal and progress to your normal diet.  Avoid alcoholic beverages for 24 hours or as instructed by your caregiver.  MEDICATIONS You may resume your normal medications unless your caregiver tells you otherwise.  WHAT YOU CAN EXPECT TODAY You may experience abdominal discomfort such as a feeling of fullness or "gas" pains.  FOLLOW-UP Your doctor will discuss the results of your test with you.  SEEK IMMEDIATE MEDICAL ATTENTION IF ANY OF THE FOLLOWING OCCUR: Excessive nausea (feeling sick to your stomach) and/or vomiting.  Severe abdominal pain and distention (swelling).  Trouble swallowing.  Temperature over 101 F (37.8 C).  Rectal bleeding or vomiting of blood.      small hiatal hernia found (nothing needs to be done about it)   your esophagus was stretched.    Biopsies taken of your stomach to make sure the H. pylori infection  is gone   office visit with Lewie Loron in 3 months   further recommendations to follow once lab reports are back for review   at patient request, I called Marcelline Deist at [phone] -  reviewed findings and recommendations

## 2022-11-18 NOTE — Anesthesia Postprocedure Evaluation (Signed)
Anesthesia Post Note  Patient: Jacqueline [name]  Procedure(s) Performed: ESOPHAGOGASTRODUODENOSCOPY (EGD) WITH PROPOFOL MALONEY DILATION BIOPSY  Patient location during evaluation: Phase II Anesthesia Type: General Level of consciousness: awake and alert and oriented Pain management: pain level controlled Vital Signs Assessment: post-procedure vital signs reviewed and stable Respiratory status: spontaneous breathing, nonlabored ventilation and respiratory function stable Cardiovascular status: blood pressure returned to baseline and stable Postop Assessment: no apparent nausea or vomiting Anesthetic complications: no  No notable events documented.   Last Vitals:  Vitals:   11/18/22 1316 11/18/22 1352  BP: 130/72 105/66  Pulse: 74 78  Resp: 15 18  Temp: 36.9 C 36.8 C  SpO2: 98% 95%    Last Pain:  Vitals:   11/18/22 1352  TempSrc: Oral  PainSc: 0-No pain                 Jacqueline [name]

## 2022-11-18 NOTE — OR Nursing (Signed)
[facility] lab received h-pylori samples at 1355 from [facility] Nurse

## 2022-11-18 NOTE — Transfer of Care (Signed)
Immediate Anesthesia Transfer of Care Note  Patient: Jacqueline [name]  Procedure(s) Performed: ESOPHAGOGASTRODUODENOSCOPY (EGD) WITH PROPOFOL MALONEY DILATION BIOPSY  Patient Location: Endoscopy Unit  Anesthesia Type:General  Level of Consciousness: drowsy  Airway & Oxygen Therapy: Patient Spontanous Breathing  Post-op Assessment: Report given to RN and Post -op Vital signs reviewed and stable  Post vital signs: Reviewed and stable  Last Vitals:  Vitals Value Taken Time  BP 105/66 11/18/22 1352  Temp 36.8 C 11/18/22 1352  Pulse 78 11/18/22 1352  Resp 18 11/18/22 1352  SpO2 95 % 11/18/22 1352    Last Pain:  Vitals:   11/18/22 1352  TempSrc: Oral  PainSc: 0-No pain      Patients Stated Pain Goal: 7 (11/18/22 1316)  Complications: No notable events documented.

## 2022-11-19 ENCOUNTER — Encounter (HOSPITAL_COMMUNITY): Payer: 59

## 2022-11-19 LAB — SURGICAL PATHOLOGY

## 2022-11-21 LAB — MISC LABCORP TEST (SEND OUT): Labcorp test code: 180885

## 2022-11-21 NOTE — Telephone Encounter (Signed)
Left message for her to call back, advised no for July 17th we will need to pick another day.

## 2022-11-22 ENCOUNTER — Ambulatory Visit (HOSPITAL_COMMUNITY): Payer: 59

## 2022-11-22 DIAGNOSIS — M542 Cervicalgia: Secondary | ICD-10-CM

## 2022-11-22 NOTE — Therapy (Signed)
OUTPATIENT PHYSICAL THERAPY CERVICAL Treatment Progress Note Reporting Period 5/10 to 11/14/22  See note below for Objective Data and Assessment of Progress/Goals.      Patient Name: Jacqueline [name] MRN: [id number] DOB:1961-05-23, 62 y.o., female Today's Date: 11/22/2022  END OF SESSION:  END OF SESSION:   PT End of Session - 11/22/22 1348     Visit Number 5    Number of Visits 8    Date for PT Re-Evaluation 12/16/22    Authorization Type [employer]    PT Start Time 1345    PT Stop Time 1426    PT Time Calculation (min) 41 min    Activity Tolerance Patient tolerated treatment well    Behavior During Therapy WFL for tasks assessed/performed                 Past Medical History:  Diagnosis Date   Anemia    Arthritis    per patient, back   Complication of anesthesia 2018   per patient HR dropped during partial hysterectomy surgery   Fibroid uterus    GERD (gastroesophageal reflux disease)    H/O total hysterectomy    Hyperlipidemia    Borderline   Hypertension    Numbness and tingling in right hand    Pre-diabetes    borderline   Past Surgical History:  Procedure Laterality Date   BACK SURGERY     BALLOON DILATION N/A 05/22/2021   Procedure: BALLOON DILATION;  Surgeon: Lanelle Bal, DO;  Location: [facility];  Service: Endoscopy;  Laterality: N/A;   CHOLECYSTECTOMY     COLONOSCOPY  2003   Dr. Jena Gauss: Anal canal hemorrhoids   COLONOSCOPY N/A 08/25/2014   Normal colonoscopy.  Next colonoscopy in 2026.  Procedure: COLONOSCOPY;  Surgeon: Corbin Ade, MD;  Location: [facility];  Service: Endoscopy;  Laterality: N/A;  1200   ESOPHAGOGASTRODUODENOSCOPY N/A 08/25/2014   Procedure: ESOPHAGOGASTRODUODENOSCOPY (EGD);  Surgeon: Corbin Ade, MD;  Location: [facility];  Service: Endoscopy;  Laterality: N/A;   ESOPHAGOGASTRODUODENOSCOPY (EGD) WITH PROPOFOL N/A 05/22/2021   Web and distal esophagus, dilated, gastriti, biopsy positive for mildly  active H. pylori gastritis.  s procedure: ESOPHAGOGASTRODUODENOSCOPY (EGD) WITH PROPOFOL;  Surgeon: Lanelle Bal, DO;  Location: [facility];  Service: Endoscopy;  Laterality: N/A;  9:15am   SALPINGOOPHORECTOMY Bilateral 11/06/2016   Procedure: BILATERAL SALPINGO OOPHORECTOMY;  Surgeon: Lazaro Arms, MD;  Location: [facility];  Service: Gynecology;  Laterality: Bilateral;   SUPRACERVICAL ABDOMINAL HYSTERECTOMY N/A 11/06/2016   Procedure: HYSTERECTOMY SUPRACERVICAL ABDOMINAL;  Surgeon: Lazaro Arms, MD;  Location: [facility];  Service: Gynecology;  Laterality: N/A;   TRANSFORAMINAL LUMBAR INTERBODY FUSION (TLIF) WITH PEDICLE SCREW FIXATION 2 LEVEL Left 07/28/2019   Procedure: LEFT-SIDED LUMBAR 4- 5, LUMBAR 5 -SACRUM1 TRANSFORAMINAL LUMBAR INTERBODY FUSION WITH INSTRUMENTATION AND ALLOGRAFT;  Surgeon: Estill Bamberg, MD;  Location: [facility];  Service: Orthopedics;  Laterality: Left;   TUBAL LIGATION     Patient Active Problem List   Diagnosis Date Noted   Acute non-recurrent maxillary sinusitis 10/26/2022   Mucopurulent conjunctivitis of both eyes 10/26/2022   Acute bronchitis 10/25/2022   Maxillary sinusitis, acute 10/25/2022   Conjunctivitis 10/25/2022   Neck pain, chronic 08/28/2022   Sleep apnea 08/28/2022   Need for immunization against influenza 02/28/2022   Screening for cervical cancer 12/21/2021   H. pylori infection 12/05/2021   Snoring 11/23/2021   Insomnia 11/23/2021   Annual physical exam 10/16/2021  Dermatitis 10/16/2021   Need for varicella vaccine 10/16/2021   Diarrhea 07/05/2021   Stomach cramps 07/05/2021   Nausea & vomiting 07/05/2021   Encounter for vaccination 07/05/2021   Right foot pain 05/16/2021   Dysphagia 05/01/2021   Right shoulder pain 12/27/2020   Rash 12/13/2020   Chronic back pain 04/04/2020   History of back surgery 04/04/2020   Radiculopathy 07/28/2019   Borderline diabetes 12/25/2018   Hypertension 06/30/2018   S/P abdominal supracervical  subtotal hysterectomy 11/06/2016   Trigger middle finger of right hand 04/24/2016   GERD (gastroesophageal reflux disease) 08/04/2014   Shoulder strain 07/26/2014   Degenerative arthritis of thumb 07/26/2014   Left knee pain 02/28/2014   Hip pain 04/15/2013   Epicondylitis, lateral (tennis elbow) 04/13/2013   Constipation 09/15/2012   Hyperlipidemia 11/28/2011   Obesity, Class I, BMI 30.0-34.9 (see actual BMI) 09/15/2011   ALLERGIC RHINITIS, SEASONAL 11/13/2007    PCP: Gilmore Laroche, FNP  REFERRING PROVIDER: Darreld Mclean, MD  REFERRING DIAG: M54.2 (ICD-10-CM) - Cervicalgia M54.2,G89.29 (ICD-10-CM) - Neck pain, chronic  THERAPY DIAG:  Neck pain  Rationale for Evaluation and Treatment: Rehabilitation  ONSET DATE: over a year  SUBJECTIVE:                                                                                                                                                                                                         SUBJECTIVE STATEMENT: Will have Left knee surgery next month; walks in with cane today; states neck pain 4/10; reports it will ease off some but then return.  PERTINENT HISTORY:  Having some lower extremity issues Back fusion in 2021  PAIN:  Are you having pain? Yes: NPRS scale: 4/10 Pain location: neck Pain description: hurting Aggravating factors: turning it the way, extension Relieving factors: massage, muscle relaxer  PRECAUTIONS: None  WEIGHT BEARING RESTRICTIONS: No  FALLS:  Has patient fallen in last 6 months? No  OCCUPATION: work part time as a Conservation officer, [employer]  PLOF: Independent  PATIENT GOALS: get it right  NEXT MD VISIT: 12/03/22 PCP  OBJECTIVE:   DIAGNOSTIC FINDINGS:    Impression:  some degenerative changes of the cervical spine as noted, no acute findings.   Electronically Signed Darreld Mclean, MD 12/12/20232:32 PM  PATIENT SURVEYS:  FOTO 48 11/14/22: 45  COGNITION: Overall cognitive status: Within functional  limits for tasks assessed   POSTURE: rounded shoulders, forward head, and guarding  PALPATION: Tender bilateral upper traps and levator   CERVICAL ROM:   Active ROM AROM (deg) eval AROM  11/14/22 AROM 11/22/22  Flexion 12 15   Extension 14 16 28   Right lateral flexion 19 20   Left lateral flexion 20 22   Right rotation 31 30   Left rotation 36 38    (Blank rows = not tested)  UPPER EXTREMITY ROM:  Active ROM Right eval Left eval Right 11/12/22 Left  11/12/22  Shoulder flexion ~100 deg ~90 deg* 146 deg 141 deg pain in tricep region  Shoulder extension      Shoulder abduction      Shoulder adduction      Shoulder extension      Shoulder internal rotation      Shoulder external rotation      Elbow flexion      Elbow extension      Wrist flexion      Wrist extension      Wrist ulnar deviation      Wrist radial deviation      Wrist pronation      Wrist supination       (Blank rows = not tested)  UPPER EXTREMITY MMT:  MMT Right eval Left eval Right 11/14/22 Left 11/14/22  Shoulder flexion 4 4 4/5 pulling lateral shoulder 4+  Shoulder extension      Shoulder abduction   4+ 4+  Shoulder adduction      Shoulder extension   4+ 4+   Shoulder internal rotation      Shoulder external rotation      Middle trapezius      Lower trapezius      Elbow flexion 4+ 4+ 5/5 5/5  Elbow extension 4+ 4+ 5/5 5/5  Wrist flexion      Wrist extension      Wrist ulnar deviation      Wrist radial deviation      Wrist pronation      Wrist supination      Grip strength       (Blank rows = not tested)  TODAY'S TREATMENT:                                                                                                                              DATE:  11/22/22 UBE 2' forward/2' backward L1  Seated: Cervical retraction 2 x 10 Cervical retraction with extension x 10 (improved cervical extension to 28 degrees after) Scapular retraction RTB x 10 Scapular retraction GTB x 10 Bilateral  shoulder ER GTB 2 x 10 Manual STW to cervical spine paraspinals and bilateral upper trap x 10'; no other intervention performed during manual treatment   11/14/22: UBE 2' forward/2' backward L1 FOTO- 45 was 48 ROM measurements- see above MMT- see above 3D cervical excursion Chin tucks Reviewed goals Manual supine STM to UT and neck mm, PROM all directions  11/12/22: Standing with back against wall: Cervical retraction 10x 5" UE flexion with SPC standing back against wall with chin tuck Wall slides 10x Wall arch 10x   Seated X to  V 10x. Wback 10x ROM measurement RTB rows and shoulder extension 10x UBE 2' forward/2' backward L1  11/01/22: Sitting: Cervical stabilization with wand shoulder flexion x 10 Cervical stabilization with wand scapular protraction/retraction x 10 Cervical excursion Scapular retraction with cervical retraction x 10   Cervical isometric for SB and extension 5 x each x 5"  Retro shoulder rolls Cervical excursion x 5   10/18/22 physical therapy evaluation and HEP instruction   PATIENT EDUCATION:  Education details: Patient educated on exam findings, POC, scope of PT, HEP, and what to expect next visit. Person educated: Patient Education method: Explanation, Demonstration, and Handouts Education comprehension: verbalized understanding, returned demonstration, verbal cues required, and tactile cues required   HOME EXERCISE PROGRAM: 11/22/22 cervical retraction and extension             11/01/22            - Seated Shoulder Flexion AAROM with Dowel  - 1 x daily - 7 x weekly - 1 sets - 10 reps - Seated Scapular Protraction and Retraction  - 1 x daily - 7 x weekly - 1 sets - 10 reps - 3-5 seconds  hold - Seated Isometric Cervical Extension  - 1 x daily - 7 x weekly - 1 sets - 5-10 reps - 3-5 hold - Seated Isometric Cervical Sidebending  - 1 x daily - 7 x weekly - 1 sets - 5-10 reps - 3-5  hold Access Code: [email or web address] URL:  [email or web address] Date: 10/18/2022 Prepared by: [facility]  Exercises - Seated Cervical Rotation AROM  - 1 x daily - 7 x weekly - 3 sets - 10 reps - Seated Cervical Sidebending AROM  - 1 x daily - 7 x weekly - 3 sets - 10 reps - Seated Cervical Flexion AROM  - 1 x daily - 7 x weekly - 3 sets - 10 reps - Seated Cervical Extension AROM  - 1 x daily - 7 x weekly - 3 sets - 10 reps - Seated Cervical Retraction  - 1 x daily - 7 x weekly - 1 sets - 10 reps - Correct Seated Posture  - 1 x daily - 7 x weekly - 3 sets - 10 reps  ASSESSMENT:  CLINICAL IMPRESSION: Patient arrives today using SPC; states knee is painful and will be having surgery next month. Added cervical retractions with extensions to improve overall neck mobility with noted improved cervical extension afterwards.  Patient with noted continued tightness bilateral upper traps so continued with manual to decrease tissue tightness. Pt will continue to benefit from skilled intervention to improve cervical mobility, pain control, and UE strength overhead to increase ADLs.     OBJECTIVE IMPAIRMENTS: decreased activity tolerance, decreased endurance, decreased knowledge of condition, decreased mobility, difficulty walking, decreased ROM, decreased strength, hypomobility, increased fascial restrictions, impaired perceived functional ability, impaired flexibility, and pain.   ACTIVITY LIMITATIONS: carrying, lifting, bending, sitting, standing, sleeping, bathing, reach over head, hygiene/grooming, and caring for others  PARTICIPATION LIMITATIONS: meal prep, cleaning, laundry, driving, shopping, community activity, occupation, and yard work  [facility] POTENTIAL: Good  CLINICAL DECISION MAKING: Stable/uncomplicated  EVALUATION COMPLEXITY: Low   GOALS: Goals reviewed with patient? No  SHORT TERM GOALS: Target date: 11/01/2022  patient will be independent with initial HEP Baseline: 11/14/22:  Reports compliance with HEP  daily Goal status: MET  2.  Patient will self report 50% improvement to improve tolerance for functional activity  Baseline: 11/14/22:  70% improvement Goal status: IN PROGRESS  LONG TERM GOALS: Target date: 11/15/2022  Patient will be independent in self management strategies to improve quality of life and functional outcomes.  Baseline:  Goal status: IN PROGRESS  2.  Patient will self report 50% improvement to improve tolerance for functional activity  Baseline: 11/14/22:  70% improvement Goal status: IN PROGRESS  3.  Patient will increased cervical mobility by 40 degrees throughout to improve ability to scan for traffic with driving Baseline: see above Goal status: MET  4.  Patient will improve FOTO score by 10 points Baseline: 48; 11/14/22:  45% Goal status: IN PROGRESS  5.  Patient will increase  shoulder flexion MMTs to 4+ to 5/5 without pain to promote return to lifting milk out of the fridge without pain  Baseline: see above Goal status: MET  6.  Patient will increased bilateral shoulder flexion to 140 degrees or better to be able to reach overhead to fix her hair Baseline: see above, 11/14/22: see above, ROM goal met reports she continues to have difficulty reaching overhead due to fatigue and some pulling Goal status: IN PROGRESS   PLAN:  PT FREQUENCY: 2x/week  PT DURATION: 4 weeks  PLANNED INTERVENTIONS: Therapeutic exercises, Therapeutic activity, Neuromuscular re-education, Balance training, Gait training, Patient/Family education, Joint manipulation, Joint mobilization, Stair training, Orthotic/Fit training, DME instructions, Aquatic Therapy, Dry Needling, Electrical stimulation, Spinal manipulation, Spinal mobilization, Cryotherapy, Moist heat, Compression bandaging, scar mobilization, Splintting, Taping, Traction, Ultrasound, Ionotophoresis 4mg /ml Dexamethasone, and Manual therapy   PLAN FOR NEXT SESSION: UBE and overhead tasks, progress cervical mobility,  postural strength and upper extremity strength as able; manual as needed  2:27 PM, 11/22/22 Manley Fason Small Jaspreet Bodner MPT [facility] [place] [phone] [phone]

## 2022-11-26 ENCOUNTER — Ambulatory Visit (HOSPITAL_COMMUNITY): Payer: 59

## 2022-11-26 ENCOUNTER — Encounter (HOSPITAL_COMMUNITY): Payer: Self-pay | Admitting: Internal Medicine

## 2022-11-27 ENCOUNTER — Other Ambulatory Visit (HOSPITAL_COMMUNITY): Payer: Self-pay | Admitting: Family Medicine

## 2022-11-27 DIAGNOSIS — Z1231 Encounter for screening mammogram for malignant neoplasm of breast: Secondary | ICD-10-CM

## 2022-11-27 LAB — MISC LABCORP TEST (SEND OUT)

## 2022-11-28 ENCOUNTER — Ambulatory Visit (HOSPITAL_COMMUNITY): Payer: 59

## 2022-11-28 DIAGNOSIS — M542 Cervicalgia: Secondary | ICD-10-CM | POA: Diagnosis not present

## 2022-11-28 NOTE — Therapy (Signed)
OUTPATIENT PHYSICAL THERAPY CERVICAL Treatment    Patient Name: Jacqueline [name] MRN: [id number] DOB:May 25, 1961, 62 y.o., female Today's Date: 11/28/2022  END OF SESSION:  END OF SESSION:   PT End of Session - 11/28/22 1320     Visit Number 6    Number of Visits 8    Date for PT Re-Evaluation 12/16/22    Authorization Type [employer]    PT Start Time 1320    PT Stop Time 1400    PT Time Calculation (min) 40 min    Activity Tolerance Patient tolerated treatment well    Behavior During Therapy WFL for tasks assessed/performed                 Past Medical History:  Diagnosis Date   Anemia    Arthritis    per patient, back   Complication of anesthesia 2018   per patient HR dropped during partial hysterectomy surgery   Fibroid uterus    GERD (gastroesophageal reflux disease)    H/O total hysterectomy    Hyperlipidemia    Borderline   Hypertension    Numbness and tingling in right hand    Pre-diabetes    borderline   Past Surgical History:  Procedure Laterality Date   BACK SURGERY     BALLOON DILATION N/A 05/22/2021   Procedure: BALLOON DILATION;  Surgeon: Lanelle Bal, DO;  [facility];  Service: Endoscopy;  Laterality: N/A;   BIOPSY  11/18/2022   Procedure: BIOPSY;  Surgeon: Corbin Ade, MD;  [facility]: [facility];  Service: Endoscopy;;   CHOLECYSTECTOMY     COLONOSCOPY  2003   Dr. Jena Gauss: Anal canal hemorrhoids   COLONOSCOPY N/A 08/25/2014   Normal colonoscopy.  Next colonoscopy in 2026.  Procedure: COLONOSCOPY;  Surgeon: Corbin Ade, MD;  [facility]: [facility];  Service: Endoscopy;  Laterality: N/A;  1200   ESOPHAGOGASTRODUODENOSCOPY N/A 08/25/2014   Procedure: ESOPHAGOGASTRODUODENOSCOPY (EGD);  Surgeon: Corbin Ade, MD;  [facility];  Service: Endoscopy;  Laterality: N/A;   ESOPHAGOGASTRODUODENOSCOPY (EGD) WITH PROPOFOL N/A 05/22/2021   Web and distal esophagus, dilated, gastriti, biopsy positive for mildly  active H. pylori gastritis.  s procedure: ESOPHAGOGASTRODUODENOSCOPY (EGD) WITH PROPOFOL;  Surgeon: Lanelle Bal, DO;  [facility];  Service: Endoscopy;  Laterality: N/A;  9:15am   ESOPHAGOGASTRODUODENOSCOPY (EGD) WITH PROPOFOL N/A 11/18/2022   Procedure: ESOPHAGOGASTRODUODENOSCOPY (EGD) WITH PROPOFOL;  Surgeon: Corbin Ade, MD;  [facility];  Service: Endoscopy;  Laterality: N/A;  2:15 pm, asa 2   MALONEY DILATION N/A 11/18/2022   Procedure: MALONEY DILATION;  Surgeon: Corbin Ade, MD;  [facility];  Service: Endoscopy;  Laterality: N/A;   SALPINGOOPHORECTOMY Bilateral 11/06/2016   Procedure: BILATERAL SALPINGO OOPHORECTOMY;  Surgeon: Lazaro Arms, MD;  Location: [facility];  Service: Gynecology;  Laterality: Bilateral;   SUPRACERVICAL ABDOMINAL HYSTERECTOMY N/A 11/06/2016   Procedure: HYSTERECTOMY SUPRACERVICAL ABDOMINAL;  Surgeon: Lazaro Arms, MD;  Location: [facility];  Service: Gynecology;  Laterality: N/A;   TRANSFORAMINAL LUMBAR INTERBODY FUSION (TLIF) WITH PEDICLE SCREW FIXATION 2 LEVEL Left 07/28/2019   Procedure: LEFT-SIDED LUMBAR 4- 5, LUMBAR 5 -SACRUM1 TRANSFORAMINAL LUMBAR INTERBODY FUSION WITH INSTRUMENTATION AND ALLOGRAFT;  Surgeon: Estill Bamberg, MD;  Location: [facility];  Service: Orthopedics;  Laterality: Left;   TUBAL LIGATION     Patient Active Problem List   Diagnosis Date Noted   Acute non-recurrent maxillary sinusitis 10/26/2022   Mucopurulent  conjunctivitis of both eyes 10/26/2022   Acute bronchitis 10/25/2022   Maxillary sinusitis, acute 10/25/2022   Conjunctivitis 10/25/2022   Neck pain, chronic 08/28/2022   Sleep apnea 08/28/2022   Need for immunization against influenza 02/28/2022   Screening for cervical cancer 12/21/2021   H. pylori infection 12/05/2021   Snoring 11/23/2021   Insomnia 11/23/2021   Annual physical exam 10/16/2021   Dermatitis 10/16/2021   Need for varicella vaccine 10/16/2021   Diarrhea 07/05/2021    Stomach cramps 07/05/2021   Nausea & vomiting 07/05/2021   Encounter for vaccination 07/05/2021   Right foot pain 05/16/2021   Dysphagia 05/01/2021   Right shoulder pain 12/27/2020   Rash 12/13/2020   Chronic back pain 04/04/2020   History of back surgery 04/04/2020   Radiculopathy 07/28/2019   Borderline diabetes 12/25/2018   Hypertension 06/30/2018   S/P abdominal supracervical subtotal hysterectomy 11/06/2016   Trigger middle finger of right hand 04/24/2016   GERD (gastroesophageal reflux disease) 08/04/2014   Shoulder strain 07/26/2014   Degenerative arthritis of thumb 07/26/2014   Left knee pain 02/28/2014   Hip pain 04/15/2013   Epicondylitis, lateral (tennis elbow) 04/13/2013   Constipation 09/15/2012   Hyperlipidemia 11/28/2011   Obesity, Class I, BMI 30.0-34.9 (see actual BMI) 09/15/2011   ALLERGIC RHINITIS, SEASONAL 11/13/2007    PCP: Gilmore Laroche, FNP  REFERRING PROVIDER: Darreld Mclean, MD  REFERRING DIAG: M54.2 (ICD-10-CM) - Cervicalgia M54.2,G89.29 (ICD-10-CM) - Neck pain, chronic  THERAPY DIAG:  Neck pain  Rationale for Evaluation and Treatment: Rehabilitation  ONSET DATE: over a year  SUBJECTIVE:                                                                                                                                                                                                         SUBJECTIVE STATEMENT: Will have Left knee surgery 7/17.    walks in with cane today; neck still hurts with turning her head; mostly left side PERTINENT HISTORY:  Having some lower extremity issues Back fusion in 2021  PAIN:  Are you having pain? Yes: NPRS scale: 4/10 Pain location: neck Pain description: hurting Aggravating factors: turning it the way, extension Relieving factors: massage, muscle relaxer  PRECAUTIONS: None  WEIGHT BEARING RESTRICTIONS: No  FALLS:  Has patient fallen in last 6 months? No  OCCUPATION: work part time as a  [employer] officer[employer]  PLOF: Independent  PATIENT GOALS: get it right  NEXT MD VISIT: 12/03/22 PCP  OBJECTIVE:   DIAGNOSTIC FINDINGS:    Impression:  some degenerative changes of the cervical spine as  noted, no acute findings.   Electronically Signed Darreld Mclean, MD 12/12/20232:32 PM  PATIENT SURVEYS:  FOTO 48 11/14/22: 45  COGNITION: Overall cognitive status: Within functional limits for tasks assessed   POSTURE: rounded shoulders, forward head, and guarding  PALPATION: Tender bilateral upper traps and levator   CERVICAL ROM:   Active ROM AROM (deg) eval AROM  11/14/22 AROM 11/22/22  Flexion 12 15   Extension 14 16 28   Right lateral flexion 19 20   Left lateral flexion 20 22   Right rotation 31 30   Left rotation 36 38    (Blank rows = not tested)  UPPER EXTREMITY ROM:  Active ROM Right eval Left eval Right 11/12/22 Left  11/12/22  Shoulder flexion ~100 deg ~90 deg* 146 deg 141 deg pain in tricep region  Shoulder extension      Shoulder abduction      Shoulder adduction      Shoulder extension      Shoulder internal rotation      Shoulder external rotation      Elbow flexion      Elbow extension      Wrist flexion      Wrist extension      Wrist ulnar deviation      Wrist radial deviation      Wrist pronation      Wrist supination       (Blank rows = not tested)  UPPER EXTREMITY MMT:  MMT Right eval Left eval Right 11/14/22 Left 11/14/22  Shoulder flexion 4 4 4/5 pulling lateral shoulder 4+  Shoulder extension      Shoulder abduction   4+ 4+  Shoulder adduction      Shoulder extension   4+ 4+   Shoulder internal rotation      Shoulder external rotation      Middle trapezius      Lower trapezius      Elbow flexion 4+ 4+ 5/5 5/5  Elbow extension 4+ 4+ 5/5 5/5  Wrist flexion      Wrist extension      Wrist ulnar deviation      Wrist radial deviation      Wrist pronation      Wrist supination      Grip strength       (Blank rows = not  tested)  TODAY'S TREATMENT:                                                                                                                              DATE:  11/28/22 UBE 2.5'  forward/ 2.5' backward L1 Seated: Scapular retraction GTB 2 x 10 Shoulder extension GTB 2 x 10 Cervical retractions x 10 Cervical retraction with extension x 10 1# W back (patient with limited tolerance for shoulder elevation) 2 x 10 Upper trap stretch 5 x 10"  Manual STW to cervical spine paraspinals and bilateral upper trap x 12'; no other intervention  performed during manual treatment      11/22/22 UBE 2' forward/2' backward L1  Seated: Cervical retraction 2 x 10 Cervical retraction with extension x 10 (improved cervical extension to 28 degrees after) Scapular retraction RTB x 10 Scapular retraction GTB x 10 Bilateral shoulder ER GTB 2 x 10 Manual STW to cervical spine paraspinals and bilateral upper trap x 10'; no other intervention performed during manual treatment   11/14/22: UBE 2' forward/2' backward L1 FOTO- 45 was 48 ROM measurements- see above MMT- see above 3D cervical excursion Chin tucks Reviewed goals Manual supine STM to UT and neck mm, PROM all directions  11/12/22: Standing with back against wall: Cervical retraction 10x 5" UE flexion with SPC standing back against wall with chin tuck Wall slides 10x Wall arch 10x   Seated X to V 10x. Wback 10x ROM measurement RTB rows and shoulder extension 10x UBE 2' forward/2' backward L1  11/01/22: Sitting: Cervical stabilization with wand shoulder flexion x 10 Cervical stabilization with wand scapular protraction/retraction x 10 Cervical excursion Scapular retraction with cervical retraction x 10   Cervical isometric for SB and extension 5 x each x 5"  Retro shoulder rolls Cervical excursion x 5   10/18/22 physical therapy evaluation and HEP instruction   PATIENT EDUCATION:  Education details: Patient educated on exam  findings, POC, scope of PT, HEP, and what to expect next visit. Person educated: Patient Education method: Explanation, Demonstration, and Handouts Education comprehension: verbalized understanding, returned demonstration, verbal cues required, and tactile cues required   HOME EXERCISE PROGRAM: 11/22/22 cervical retraction and extension             11/01/22            - Seated Shoulder Flexion AAROM with Dowel  - 1 x daily - 7 x weekly - 1 sets - 10 reps - Seated Scapular Protraction and Retraction  - 1 x daily - 7 x weekly - 1 sets - 10 reps - 3-5 seconds  hold - Seated Isometric Cervical Extension  - 1 x daily - 7 x weekly - 1 sets - 5-10 reps - 3-5 hold - Seated Isometric Cervical Sidebending  - 1 x daily - 7 x weekly - 1 sets - 5-10 reps - 3-5  hold Access Code: [email or web address] URL: [email or web address] Date: 10/18/2022 Prepared by: [facility]  Exercises - Seated Cervical Rotation AROM  - 1 x daily - 7 x weekly - 3 sets - 10 reps - Seated Cervical Sidebending AROM  - 1 x daily - 7 x weekly - 3 sets - 10 reps - Seated Cervical Flexion AROM  - 1 x daily - 7 x weekly - 3 sets - 10 reps - Seated Cervical Extension AROM  - 1 x daily - 7 x weekly - 3 sets - 10 reps - Seated Cervical Retraction  - 1 x daily - 7 x weekly - 1 sets - 10 reps - Correct Seated Posture  - 1 x daily - 7 x weekly - 3 sets - 10 reps  ASSESSMENT:  CLINICAL IMPRESSION: Today's session continued to address cervical mobility and postural strengthening; standing exercise somewhat limited due to knee pain.  Added shoulder extension and W back to challenge postural muscle strength. Patient with limited tolerance for bilateral shoulder elevation.  Knee surgery scheduled for next month 7/17.   Pt will continue to benefit from skilled intervention to improve cervical mobility, pain control, and UE strength overhead to increase ADLs.  OBJECTIVE IMPAIRMENTS: decreased activity tolerance, decreased endurance,  decreased knowledge of condition, decreased mobility, difficulty walking, decreased ROM, decreased strength, hypomobility, increased fascial restrictions, impaired perceived functional ability, impaired flexibility, and pain.   ACTIVITY LIMITATIONS: carrying, lifting, bending, sitting, standing, sleeping, bathing, reach over head, hygiene/grooming, and caring for others  PARTICIPATION LIMITATIONS: meal prep, cleaning, laundry, driving, shopping, community activity, occupation, and yard work  [facility] POTENTIAL: Good  CLINICAL DECISION MAKING: Stable/uncomplicated  EVALUATION COMPLEXITY: Low   GOALS: Goals reviewed with patient? No  SHORT TERM GOALS: Target date: 11/01/2022  patient will be independent with initial HEP Baseline: 11/14/22:  Reports compliance with HEP daily Goal status: MET  2.  Patient will self report 50% improvement to improve tolerance for functional activity  Baseline: 11/14/22:  70% improvement Goal status: IN PROGRESS    LONG TERM GOALS: Target date: 11/15/2022  Patient will be independent in self management strategies to improve quality of life and functional outcomes.  Baseline:  Goal status: IN PROGRESS  2.  Patient will self report 50% improvement to improve tolerance for functional activity  Baseline: 11/14/22:  70% improvement Goal status: IN PROGRESS  3.  Patient will increased cervical mobility by 40 degrees throughout to improve ability to scan for traffic with driving Baseline: see above Goal status: MET  4.  Patient will improve FOTO score by 10 points Baseline: 48; 11/14/22:  45% Goal status: IN PROGRESS  5.  Patient will increase  shoulder flexion MMTs to 4+ to 5/5 without pain to promote return to lifting milk out of the fridge without pain  Baseline: see above Goal status: MET  6.  Patient will increased bilateral shoulder flexion to 140 degrees or better to be able to reach overhead to fix her hair Baseline: see above, 11/14/22: see  above, ROM goal met reports she continues to have difficulty reaching overhead due to fatigue and some pulling Goal status: IN PROGRESS   PLAN:  PT FREQUENCY: 2x/week  PT DURATION: 4 weeks  PLANNED INTERVENTIONS: Therapeutic exercises, Therapeutic activity, Neuromuscular re-education, Balance training, Gait training, Patient/Family education, Joint manipulation, Joint mobilization, Stair training, Orthotic/Fit training, DME instructions, Aquatic Therapy, Dry Needling, Electrical stimulation, Spinal manipulation, Spinal mobilization, Cryotherapy, Moist heat, Compression bandaging, scar mobilization, Splintting, Taping, Traction, Ultrasound, Ionotophoresis 4mg /ml Dexamethasone, and Manual therapy   PLAN FOR NEXT SESSION: UBE and overhead tasks, progress cervical mobility, postural strength and upper extremity strength as able; manual as needed  2:05 PM, 11/28/22 Janiylah Hannis Small Lithzy Bernard MPT [facility] [place] [phone] [phone]

## 2022-12-02 ENCOUNTER — Other Ambulatory Visit: Payer: Self-pay | Admitting: Orthopedic Surgery

## 2022-12-02 ENCOUNTER — Telehealth: Payer: Self-pay

## 2022-12-02 DIAGNOSIS — S83232A Complex tear of medial meniscus, current injury, left knee, initial encounter: Secondary | ICD-10-CM

## 2022-12-02 DIAGNOSIS — M1712 Unilateral primary osteoarthritis, left knee: Secondary | ICD-10-CM

## 2022-12-02 NOTE — Progress Notes (Signed)
Schedule for July 17 [facility]

## 2022-12-02 NOTE — Telephone Encounter (Signed)
I called her we discussed surgery date on 12/25/22 put in orders, sent to Jermine Bibbee for approval, have mailed order to her for a walker

## 2022-12-02 NOTE — Telephone Encounter (Signed)
Will discuss with her when she comes in office unable to reach her. Scheduled for July 17 [facility]

## 2022-12-02 NOTE — Telephone Encounter (Signed)
Patient would like to see if Dr. Romeo Apple would go ahead and do script for a walker now, states she really needs it now.  Please call her at [phone].

## 2022-12-02 NOTE — Telephone Encounter (Signed)
Patient left message saying she missed your call and would like you to give her a return call.  [phone]

## 2022-12-03 ENCOUNTER — Ambulatory Visit (INDEPENDENT_AMBULATORY_CARE_PROVIDER_SITE_OTHER): Payer: 59 | Admitting: Family Medicine

## 2022-12-03 ENCOUNTER — Telehealth: Payer: Self-pay | Admitting: Family Medicine

## 2022-12-03 ENCOUNTER — Other Ambulatory Visit: Payer: Self-pay | Admitting: Family Medicine

## 2022-12-03 ENCOUNTER — Encounter: Payer: Self-pay | Admitting: Family Medicine

## 2022-12-03 VITALS — BP 121/81 | HR 96 | Ht 63.0 in | Wt 181.1 lb

## 2022-12-03 DIAGNOSIS — R7301 Impaired fasting glucose: Secondary | ICD-10-CM

## 2022-12-03 DIAGNOSIS — G8929 Other chronic pain: Secondary | ICD-10-CM | POA: Diagnosis not present

## 2022-12-03 DIAGNOSIS — E559 Vitamin D deficiency, unspecified: Secondary | ICD-10-CM

## 2022-12-03 DIAGNOSIS — K219 Gastro-esophageal reflux disease without esophagitis: Secondary | ICD-10-CM

## 2022-12-03 DIAGNOSIS — I10 Essential (primary) hypertension: Secondary | ICD-10-CM | POA: Diagnosis not present

## 2022-12-03 DIAGNOSIS — M549 Dorsalgia, unspecified: Secondary | ICD-10-CM | POA: Diagnosis not present

## 2022-12-03 DIAGNOSIS — E038 Other specified hypothyroidism: Secondary | ICD-10-CM

## 2022-12-03 DIAGNOSIS — E7849 Other hyperlipidemia: Secondary | ICD-10-CM

## 2022-12-03 DIAGNOSIS — M5489 Other dorsalgia: Secondary | ICD-10-CM

## 2022-12-03 MED ORDER — METHOCARBAMOL 750 MG PO TABS
750.0000 mg | ORAL_TABLET | Freq: Four times a day (QID) | ORAL | 0 refills | Status: DC
Start: 2022-12-03 — End: 2023-03-25

## 2022-12-03 NOTE — Patient Instructions (Signed)
I appreciate the opportunity to provide care to you today!    Follow up:  3 months  Labs: please stop by the lab during the week to get your blood drawn (CBC, CMP, TSH, Lipid profile, HgA1c, Vit D)    Please continue to a heart-healthy diet and increase your physical activities. Try to exercise for 30mins at least five days a week.      It was a pleasure to see you and I look forward to continuing to work together on your health and well-being. Please do not hesitate to call the office if you need care or have questions about your care.   Have a wonderful day and week. With Gratitude, Elveria Lauderbaugh MSN, FNP-BC  

## 2022-12-03 NOTE — Progress Notes (Unsigned)
Established Patient Office Visit  Subjective:  Patient ID: Jacqueline [name], female    DOB: 02-May-1961  Age: 62 y.o. MRN: [id number]  CC:  Chief Complaint  Patient presents with   Chronic Care Management    3 month f/u    HPI Jacqueline [name] is a 62 y.o. female with past medical history of hypertension, GRD, and Dysphagia presents for f/u of  chronic medical conditions. For the details of today's visit, please refer to the assessment and plan.     Past Medical History:  Diagnosis Date   Anemia    Arthritis    per patient, back   Complication of anesthesia 2018   per patient HR dropped during partial hysterectomy surgery   Fibroid uterus    GERD (gastroesophageal reflux disease)    H/O total hysterectomy    Hyperlipidemia    Borderline   Hypertension    Numbness and tingling in right hand    Pre-diabetes    borderline    Past Surgical History:  Procedure Laterality Date   BACK SURGERY     BALLOON DILATION N/A 05/22/2021   Procedure: BALLOON DILATION;  Surgeon: Lanelle Bal, DO;  Location: [facility];  Service: Endoscopy;  Laterality: N/A;   BIOPSY  11/18/2022   Procedure: BIOPSY;  Surgeon: Corbin Ade, MD;  Location: [facility];  Service: Endoscopy;;   CHOLECYSTECTOMY     COLONOSCOPY  2003   Dr. Jena Gauss: Anal canal hemorrhoids   COLONOSCOPY N/A 08/25/2014   Normal colonoscopy.  Next colonoscopy in 2026.  Procedure: COLONOSCOPY;  Surgeon: Corbin Ade, MD;  Location: [facility];  Service: Endoscopy;  Laterality: N/A;  1200   ESOPHAGOGASTRODUODENOSCOPY N/A 08/25/2014   Procedure: ESOPHAGOGASTRODUODENOSCOPY (EGD);  Surgeon: Corbin Ade, MD;  Location: [facility];  Service: Endoscopy;  Laterality: N/A;   ESOPHAGOGASTRODUODENOSCOPY (EGD) WITH PROPOFOL N/A 05/22/2021   Web and distal esophagus, dilated, gastriti, biopsy positive for mildly active H. pylori gastritis.  s procedure: ESOPHAGOGASTRODUODENOSCOPY (EGD) WITH PROPOFOL;  Surgeon: Lanelle Bal, DO;  Location: [facility];  Service: Endoscopy;  Laterality: N/A;  9:15am   ESOPHAGOGASTRODUODENOSCOPY (EGD) WITH PROPOFOL N/A 11/18/2022   Procedure: ESOPHAGOGASTRODUODENOSCOPY (EGD) WITH PROPOFOL;  Surgeon: Corbin Ade, MD;  Location: [facility];  Service: Endoscopy;  Laterality: N/A;  2:15 pm, asa 2   MALONEY DILATION N/A 11/18/2022   Procedure: MALONEY DILATION;  Surgeon: Corbin Ade, MD;  Location: [facility];  Service: Endoscopy;  Laterality: N/A;   SALPINGOOPHORECTOMY Bilateral 11/06/2016   Procedure: BILATERAL SALPINGO OOPHORECTOMY;  Surgeon: Lazaro Arms, MD;  Location: [facility];  Service: Gynecology;  Laterality: Bilateral;   SUPRACERVICAL ABDOMINAL HYSTERECTOMY N/A 11/06/2016   Procedure: HYSTERECTOMY SUPRACERVICAL ABDOMINAL;  Surgeon: Lazaro Arms, MD;  Location: [facility];  Service: Gynecology;  Laterality: N/A;   TRANSFORAMINAL LUMBAR INTERBODY FUSION (TLIF) WITH PEDICLE SCREW FIXATION 2 LEVEL Left 07/28/2019   Procedure: LEFT-SIDED LUMBAR 4- 5, LUMBAR 5 -SACRUM1 TRANSFORAMINAL LUMBAR INTERBODY FUSION WITH INSTRUMENTATION AND ALLOGRAFT;  Surgeon: Estill Bamberg, MD;  Location: [facility];  Service: Orthopedics;  Laterality: Left;   TUBAL LIGATION      Family History  Problem Relation Age of Onset   Breast cancer Maternal Aunt    Hypertension Maternal Aunt    Cancer Maternal Aunt        Breast   Cancer Maternal Grandmother         lymphnodes   Colon cancer Neg  Hx     Social History   Socioeconomic History   Marital status: Married    Spouse name: Not on file   Number of children: 3   Years of education: Not on file   Highest education level: Not on file  Occupational History   Occupation: PT at [employer]  Tobacco Use   Smoking status: Never   Smokeless tobacco: Never   Tobacco comments:    Never smoked  Vaping Use   Vaping Use: Never used  Substance and Sexual Activity   Alcohol use: No    Alcohol/week: 0.0 standard drinks of alcohol   Drug use:  No   Sexual activity: Yes    Birth control/protection: Post-menopausal, Surgical  Other Topics Concern   Not on file  Social History Narrative   Lives with her spouse, work part at [employer]    Social Determinants of Health   Financial Resource Strain: Not on file  Food Insecurity: Not on file  Transportation Needs: Not on file  Physical Activity: Not on file  Stress: Not on file  Social Connections: Not on file  Intimate Partner Violence: Not on file    Outpatient Medications Prior to Visit  Medication Sig Dispense Refill   acetaminophen (TYLENOL) 650 MG CR tablet Take 650 mg by mouth every 8 (eight) hours as needed for pain.     acyclovir (ZOVIRAX) 400 MG tablet Take 400 mg by mouth 3 (three) times daily as needed (fever blisters).     amLODipine (NORVASC) 5 MG tablet Take 1 tablet by mouth once daily 90 tablet 0   atorvastatin (LIPITOR) 20 MG tablet Take 1 tablet by mouth once daily 90 tablet 0   azelastine (ASTELIN) 0.1 % nasal spray Place 1 spray into both nostrils 2 (two) times daily as needed for rhinitis. Use in each nostril as directed     benzonatate (TESSALON PERLES) 100 MG capsule Take 1 capsule (100 mg total) by mouth 3 (three) times daily as needed for cough. 30 capsule 0   clotrimazole-betamethasone (LOTRISONE) cream Apply 1 Application topically 2 (two) times daily as needed (rash).     diclofenac (VOLTAREN) 75 MG EC tablet TAKE 1 TABLET BY MOUTH TWICE DAILY WITH A MEAL 60 tablet 5   DULoxetine (CYMBALTA) 30 MG capsule Take 1 capsule (30 mg total) by mouth daily. (Patient taking differently: Take 30 mg by mouth at bedtime as needed.) 90 capsule 0   estradiol (ESTRACE) 0.1 MG/GM vaginal cream INSERT 2 GRAMS VAGINALLY AS DIRECTED EVERY OTHER NIGHT 43 g 11   fluconazole (DIFLUCAN) 150 MG tablet Take one tablet by mouth once daily as needed for vaginal itch 2 tablet 0   fluticasone (FLONASE) 50 MCG/ACT nasal spray Place 1 spray into both nostrils daily. (Patient taking  differently: Place 1 spray into both nostrils daily as needed for allergies.) 16 g 6   Multiple Vitamin (MULTIVITAMIN WITH MINERALS) TABS tablet Take 1 tablet by mouth daily with supper. One-A-Day Multivitamin     pantoprazole (PROTONIX) 40 MG tablet Take 1 tablet (40 mg total) by mouth 2 (two) times daily before a meal. 180 tablet 0   penicillin v potassium (VEETID) 500 MG tablet Take 1 tablet (500 mg total) by mouth 3 (three) times daily. 30 tablet 0   sennosides-docusate sodium (SENOKOT-S) 8.6-50 MG tablet Take 1 tablet by mouth daily as needed for constipation. 30 tablet 0   traZODone (DESYREL) 50 MG tablet Take 0.5-1 tablets (25-50 mg total) by mouth at  bedtime as needed for sleep. 30 tablet 3   methocarbamol (ROBAXIN) 500 MG tablet TAKE 1 TABLET BY MOUTH EVERY SIX HOURS AS NEEDED FOR CHRONIC BACK PAIN. 30 tablet 0   No facility-administered medications prior to visit.    No Known Allergies  ROS Review of Systems  Constitutional:  Negative for chills and fever.  Eyes:  Negative for visual disturbance.  Respiratory:  Negative for chest tightness and shortness of breath.   Neurological:  Negative for dizziness and headaches.      Objective:    Physical Exam HENT:     Head: Normocephalic.     Mouth/Throat:     Mouth: Mucous membranes are moist.  Cardiovascular:     Rate and Rhythm: Normal rate.     Heart sounds: Normal heart sounds.  Pulmonary:     Effort: Pulmonary effort is normal.     Breath sounds: Normal breath sounds.  Neurological:     Mental Status: She is alert.     BP 121/81   Pulse 96   Ht 5\' 3"  (1.6 m)   Wt 181 lb 1.9 oz (82.2 kg)   LMP 10/26/2016 (Exact Date)   SpO2 97%   BMI 32.08 kg/m  Wt Readings from Last 3 Encounters:  12/03/22 181 lb 1.9 oz (82.2 kg)  11/18/22 180 lb (81.6 kg)  10/31/22 180 lb (81.6 kg)    Lab Results  Component Value Date   TSH 1.220 09/06/2022   Lab Results  Component Value Date   WBC 4.7 09/06/2022   HGB 12.3  09/06/2022   HCT 36.3 09/06/2022   MCV 90 09/06/2022   PLT 217 09/06/2022   Lab Results  Component Value Date   NA 140 09/06/2022   K 4.1 09/06/2022   CO2 23 09/06/2022   GLUCOSE 90 09/06/2022   BUN 15 09/06/2022   CREATININE 1.06 (H) 09/06/2022   BILITOT 0.9 09/06/2022   ALKPHOS 112 09/06/2022   AST 40 09/06/2022   ALT 47 (H) 09/06/2022   PROT 7.3 09/06/2022   ALBUMIN 4.3 09/06/2022   CALCIUM 9.5 09/06/2022   ANIONGAP 6 08/16/2022   EGFR 60 09/06/2022   Lab Results  Component Value Date   CHOL 170 09/06/2022   Lab Results  Component Value Date   HDL 54 09/06/2022   Lab Results  Component Value Date   LDLCALC 102 (H) 09/06/2022   Lab Results  Component Value Date   TRIG 75 09/06/2022   Lab Results  Component Value Date   CHOLHDL 3.1 09/06/2022   Lab Results  Component Value Date   HGBA1C 5.9 (H) 09/06/2022      Assessment & Plan:  Gastroesophageal reflux disease without esophagitis Assessment & Plan: Followed by GI Continue protonix 40 mg BID   Primary hypertension Assessment & Plan: Controlled, no change in medication.   Other chronic back pain Assessment & Plan: Will increase methocarbamol to 750 mg 4 times daily. Continue taking mobic and tramadol PRN, and Cymbalta 30 mg. Follow up if symptoms worsen or fail to improve  Orders: -     Methocarbamol; Take 1 tablet (750 mg total) by mouth 4 (four) times daily.  Dispense: 30 tablet; Refill: 0  IFG (impaired fasting glucose) -     Hemoglobin A1c  Other specified hypothyroidism -     TSH + free T4  Vitamin D deficiency -     VITAMIN D 25 Hydroxy (Vit-D Deficiency, Fractures)  Other hyperlipidemia -     Lipid  panel -     CBC with Differential/Platelet -     BMP8+EGFR  Note: This chart has been completed using Engineer, civil (consulting) software, and while attempts have been made to ensure accuracy, certain words and phrases may not be transcribed as intended.    Follow-up: Return in about  3 months (around 03/05/2023).   Gilmore Laroche, FNP

## 2022-12-03 NOTE — Telephone Encounter (Signed)
Pt called in regard to sleep apnea orders / referral . Wants to have referral placed

## 2022-12-04 ENCOUNTER — Encounter (HOSPITAL_COMMUNITY): Payer: Self-pay

## 2022-12-04 ENCOUNTER — Other Ambulatory Visit: Payer: Self-pay | Admitting: Family Medicine

## 2022-12-04 ENCOUNTER — Ambulatory Visit (HOSPITAL_COMMUNITY)
Admission: RE | Admit: 2022-12-04 | Discharge: 2022-12-04 | Disposition: A | Discharge status: Home / Self Care | Payer: 59 | Source: Ambulatory Visit | Attending: Family Medicine | Admitting: Family Medicine

## 2022-12-04 ENCOUNTER — Ambulatory Visit (HOSPITAL_COMMUNITY): Payer: 59

## 2022-12-04 DIAGNOSIS — M542 Cervicalgia: Secondary | ICD-10-CM

## 2022-12-04 DIAGNOSIS — Z1231 Encounter for screening mammogram for malignant neoplasm of breast: Secondary | ICD-10-CM | POA: Insufficient documentation

## 2022-12-04 DIAGNOSIS — G4730 Sleep apnea, unspecified: Secondary | ICD-10-CM

## 2022-12-04 NOTE — Assessment & Plan Note (Addendum)
Followed by GI Continue protonix 40 mg BID

## 2022-12-04 NOTE — Assessment & Plan Note (Signed)
Will increase methocarbamol to 750 mg 4 times daily. Continue taking mobic and tramadol PRN, and Cymbalta 30 mg. Follow up if symptoms worsen or fail to improve

## 2022-12-04 NOTE — Telephone Encounter (Signed)
Referral placed.

## 2022-12-04 NOTE — Assessment & Plan Note (Signed)
Controlled, no change in medication  

## 2022-12-04 NOTE — Therapy (Signed)
OUTPATIENT PHYSICAL THERAPY CERVICAL Treatment    Patient Name: Jacqueline [name] MRN: [id number] DOB:1961-05-22, 62 y.o., female Today's Date: 12/04/2022  END OF SESSION:  END OF SESSION:   PT End of Session - 12/04/22 1136     Visit Number 7    Number of Visits 8    Date for PT Re-Evaluation 12/16/22    Authorization Type [employer]    PT Start Time 1135    PT Stop Time 1214    PT Time Calculation (min) 39 min    Activity Tolerance Patient tolerated treatment well    Behavior During Therapy WFL for tasks assessed/performed                 Past Medical History:  Diagnosis Date   Anemia    Arthritis    per patient, back   Complication of anesthesia 2018   per patient HR dropped during partial hysterectomy surgery   Fibroid uterus    GERD (gastroesophageal reflux disease)    H/O total hysterectomy    Hyperlipidemia    Borderline   Hypertension    Numbness and tingling in right hand    Pre-diabetes    borderline   Past Surgical History:  Procedure Laterality Date   BACK SURGERY     BALLOON DILATION N/A 05/22/2021   Procedure: BALLOON DILATION;  Surgeon: Lanelle Bal, DO;  [facility];  Service: Endoscopy;  Laterality: N/A;   BIOPSY  11/18/2022   Procedure: BIOPSY;  Surgeon: Corbin Ade, MD;  [facility];  Service: Endoscopy;;   CHOLECYSTECTOMY     COLONOSCOPY  2003   Dr. Jena Gauss: Anal canal hemorrhoids   COLONOSCOPY N/A 08/25/2014   Normal colonoscopy.  Next colonoscopy in 2026.  Procedure: COLONOSCOPY;  Surgeon: Corbin Ade, MD;  [facility];  Service: Endoscopy;  Laterality: N/A;  1200   ESOPHAGOGASTRODUODENOSCOPY N/A 08/25/2014   Procedure: ESOPHAGOGASTRODUODENOSCOPY (EGD);  Surgeon: Corbin Ade, MD;  [facility];  Service: Endoscopy;  Laterality: N/A;   ESOPHAGOGASTRODUODENOSCOPY (EGD) WITH PROPOFOL N/A 05/22/2021   Web and distal esophagus, dilated, gastriti, biopsy positive for mildly  active H. pylori gastritis.  s procedure: ESOPHAGOGASTRODUODENOSCOPY (EGD) WITH PROPOFOL;  Surgeon: Lanelle Bal, DO;  [facility];  Service: Endoscopy;  Laterality: N/A;  9:15am   ESOPHAGOGASTRODUODENOSCOPY (EGD) WITH PROPOFOL N/A 11/18/2022   Procedure: ESOPHAGOGASTRODUODENOSCOPY (EGD) WITH PROPOFOL;  Surgeon: Corbin Ade, MD;  [facility];  Service: Endoscopy;  Laterality: N/A;  2:15 pm, asa 2   MALONEY DILATION N/A 11/18/2022   Procedure: MALONEY DILATION;  Surgeon: Corbin Ade, MD;  [facility];  Service: Endoscopy;  Laterality: N/A;   SALPINGOOPHORECTOMY Bilateral 11/06/2016   Procedure: BILATERAL SALPINGO OOPHORECTOMY;  Surgeon: Lazaro Arms, MD;  Location: [facility];  Service: Gynecology;  Laterality: Bilateral;   SUPRACERVICAL ABDOMINAL HYSTERECTOMY N/A 11/06/2016   Procedure: HYSTERECTOMY SUPRACERVICAL ABDOMINAL;  Surgeon: Lazaro Arms, MD;  Location: [facility];  Service: Gynecology;  Laterality: N/A;   TRANSFORAMINAL LUMBAR INTERBODY FUSION (TLIF) WITH PEDICLE SCREW FIXATION 2 LEVEL Left 07/28/2019   Procedure: LEFT-SIDED LUMBAR 4- 5, LUMBAR 5 -SACRUM1 TRANSFORAMINAL LUMBAR INTERBODY FUSION WITH INSTRUMENTATION AND ALLOGRAFT;  Surgeon: Estill Bamberg, MD;  Location: [facility];  Service: Orthopedics;  Laterality: Left;   TUBAL LIGATION     Patient Active Problem List   Diagnosis Date Noted   Acute non-recurrent maxillary sinusitis 10/26/2022   Mucopurulent  conjunctivitis of both eyes 10/26/2022   Acute bronchitis 10/25/2022   Maxillary sinusitis, acute 10/25/2022   Conjunctivitis 10/25/2022   Neck pain, chronic 08/28/2022   Sleep apnea 08/28/2022   Need for immunization against influenza 02/28/2022   Screening for cervical cancer 12/21/2021   H. pylori infection 12/05/2021   Snoring 11/23/2021   Insomnia 11/23/2021   Annual physical exam 10/16/2021   Dermatitis 10/16/2021   Need for varicella vaccine 10/16/2021   Diarrhea 07/05/2021    Stomach cramps 07/05/2021   Nausea & vomiting 07/05/2021   Encounter for vaccination 07/05/2021   Right foot pain 05/16/2021   Dysphagia 05/01/2021   Right shoulder pain 12/27/2020   Rash 12/13/2020   Chronic back pain 04/04/2020   History of back surgery 04/04/2020   Radiculopathy 07/28/2019   Borderline diabetes 12/25/2018   Hypertension 06/30/2018   S/P abdominal supracervical subtotal hysterectomy 11/06/2016   Trigger middle finger of right hand 04/24/2016   GERD (gastroesophageal reflux disease) 08/04/2014   Shoulder strain 07/26/2014   Degenerative arthritis of thumb 07/26/2014   Left knee pain 02/28/2014   Hip pain 04/15/2013   Epicondylitis, lateral (tennis elbow) 04/13/2013   Constipation 09/15/2012   Hyperlipidemia 11/28/2011   Obesity, Class I, BMI 30.0-34.9 (see actual BMI) 09/15/2011   ALLERGIC RHINITIS, SEASONAL 11/13/2007    PCP: Gilmore Laroche, FNP  REFERRING PROVIDER: Darreld Mclean, MD  REFERRING DIAG: M54.2 (ICD-10-CM) - Cervicalgia M54.2,G89.29 (ICD-10-CM) - Neck pain, chronic  THERAPY DIAG:  Neck pain  Rationale for Evaluation and Treatment: Rehabilitation  ONSET DATE: over a year  SUBJECTIVE:                                                                                                                                                                                                         SUBJECTIVE STATEMENT: Reports some pain posterior neck yesterday, feels a little better today.  Lt knee continues to have sharp pain, plans for surgery 7/17. PERTINENT HISTORY:  Having some lower extremity issues Back fusion in 2021  PAIN:  Are you having pain? Yes: NPRS scale: 4/10 Pain location: neck Pain description: hurting Aggravating factors: turning it the way, extension Relieving factors: massage, muscle relaxer  PRECAUTIONS: None  WEIGHT BEARING RESTRICTIONS: No  FALLS:  Has patient fallen in last 6 months? No  OCCUPATION: work part time as  a Conservation officer, [employer]  PLOF: Independent  PATIENT GOALS: get it right  NEXT MD VISIT: 12/03/22 PCP  OBJECTIVE:   DIAGNOSTIC FINDINGS:    Impression:  some degenerative changes of the cervical spine as noted,  no acute findings.   Electronically Signed Darreld Mclean, MD 12/12/20232:32 PM  PATIENT SURVEYS:  FOTO 48 11/14/22: 45  COGNITION: Overall cognitive status: Within functional limits for tasks assessed   POSTURE: rounded shoulders, forward head, and guarding  PALPATION: Tender bilateral upper traps and levator   CERVICAL ROM:   Active ROM AROM (deg) eval AROM  11/14/22 AROM 11/22/22  Flexion 12 15   Extension 14 16 28   Right lateral flexion 19 20   Left lateral flexion 20 22   Right rotation 31 30   Left rotation 36 38    (Blank rows = not tested)  UPPER EXTREMITY ROM:  Active ROM Right eval Left eval Right 11/12/22 Left  11/12/22  Shoulder flexion ~100 deg ~90 deg* 146 deg 141 deg pain in tricep region  Shoulder extension      Shoulder abduction      Shoulder adduction      Shoulder extension      Shoulder internal rotation      Shoulder external rotation      Elbow flexion      Elbow extension      Wrist flexion      Wrist extension      Wrist ulnar deviation      Wrist radial deviation      Wrist pronation      Wrist supination       (Blank rows = not tested)  UPPER EXTREMITY MMT:  MMT Right eval Left eval Right 11/14/22 Left 11/14/22  Shoulder flexion 4 4 4/5 pulling lateral shoulder 4+  Shoulder extension      Shoulder abduction   4+ 4+  Shoulder adduction      Shoulder extension   4+ 4+   Shoulder internal rotation      Shoulder external rotation      Middle trapezius      Lower trapezius      Elbow flexion 4+ 4+ 5/5 5/5  Elbow extension 4+ 4+ 5/5 5/5  Wrist flexion      Wrist extension      Wrist ulnar deviation      Wrist radial deviation      Wrist pronation      Wrist supination      Grip strength       (Blank rows = not  tested)  TODAY'S TREATMENT:                                                                                                                              DATE:  12/04/22: UBE 2' forward/2' backward L1 Seated: Trace chain with orange theraband x1  UE flexion with cane in BUE 10x Shoulders up, back and down 10x Wback with 1# 10x Shoulder extension with 1# 10x UT stretch 2x 30"  Manual STM to cervical spine paraspinals and bilateral upper trap x 12';no other intervention performed during manual treatment  11/28/22 UBE 2.5'  forward/ 2.5' backward  L1 Seated: Scapular retraction GTB 2 x 10 Shoulder extension GTB 2 x 10 Cervical retractions x 10 Cervical retraction with extension x 10 1# W back (patient with limited tolerance for shoulder elevation) 2 x 10 Upper trap stretch 5 x 10"  Manual STW to cervical spine paraspinals and bilateral upper trap x 12'; no other intervention performed during manual treatment      11/22/22 UBE 2' forward/2' backward L1  Seated: Cervical retraction 2 x 10 Cervical retraction with extension x 10 (improved cervical extension to 28 degrees after) Scapular retraction RTB x 10 Scapular retraction GTB x 10 Bilateral shoulder ER GTB 2 x 10 Manual STW to cervical spine paraspinals and bilateral upper trap x 10'; no other intervention performed during manual treatment   11/14/22: UBE 2' forward/2' backward L1 FOTO- 45 was 48 ROM measurements- see above MMT- see above 3D cervical excursion Chin tucks Reviewed goals Manual supine STM to UT and neck mm, PROM all directions  11/12/22: Standing with back against wall: Cervical retraction 10x 5" UE flexion with SPC standing back against wall with chin tuck Wall slides 10x Wall arch 10x   Seated X to V 10x. Wback 10x ROM measurement RTB rows and shoulder extension 10x UBE 2' forward/2' backward L1  11/01/22: Sitting: Cervical stabilization with wand shoulder flexion x 10 Cervical stabilization  with wand scapular protraction/retraction x 10 Cervical excursion Scapular retraction with cervical retraction x 10   Cervical isometric for SB and extension 5 x each x 5"  Retro shoulder rolls Cervical excursion x 5   10/18/22 physical therapy evaluation and HEP instruction   PATIENT EDUCATION:  Education details: Patient educated on exam findings, POC, scope of PT, HEP, and what to expect next visit. Person educated: Patient Education method: Explanation, Demonstration, and Handouts Education comprehension: verbalized understanding, returned demonstration, verbal cues required, and tactile cues required   HOME EXERCISE PROGRAM: 11/22/22 cervical retraction and extension             11/01/22            - Seated Shoulder Flexion AAROM with Dowel  - 1 x daily - 7 x weekly - 1 sets - 10 reps - Seated Scapular Protraction and Retraction  - 1 x daily - 7 x weekly - 1 sets - 10 reps - 3-5 seconds  hold - Seated Isometric Cervical Extension  - 1 x daily - 7 x weekly - 1 sets - 5-10 reps - 3-5 hold - Seated Isometric Cervical Sidebending  - 1 x daily - 7 x weekly - 1 sets - 5-10 reps - 3-5  hold Access Code: [email or web address] URL: [email or web address] Date: 10/18/2022 Prepared by: [facility]  Exercises - Seated Cervical Rotation AROM  - 1 x daily - 7 x weekly - 3 sets - 10 reps - Seated Cervical Sidebending AROM  - 1 x daily - 7 x weekly - 3 sets - 10 reps - Seated Cervical Flexion AROM  - 1 x daily - 7 x weekly - 3 sets - 10 reps - Seated Cervical Extension AROM  - 1 x daily - 7 x weekly - 3 sets - 10 reps - Seated Cervical Retraction  - 1 x daily - 7 x weekly - 1 sets - 10 reps - Correct Seated Posture  - 1 x daily - 7 x weekly - 3 sets - 10 reps  ASSESSMENT:  CLINICAL IMPRESSION: Session focus with cervical mobility, postural strengthening and UE overhead strengthening exercises.  Limited standing activities this session due to knee pain.  Began session with UBE and added UE  tracing theraband through chain, reports of increased fatigue following exercise.  EOS with manual STM to address fascial restrictions to assist with cervical mobility and pain control, encouraged pt to stay hydrated following manual.    OBJECTIVE IMPAIRMENTS: decreased activity tolerance, decreased endurance, decreased knowledge of condition, decreased mobility, difficulty walking, decreased ROM, decreased strength, hypomobility, increased fascial restrictions, impaired perceived functional ability, impaired flexibility, and pain.   ACTIVITY LIMITATIONS: carrying, lifting, bending, sitting, standing, sleeping, bathing, reach over head, hygiene/grooming, and caring for others  PARTICIPATION LIMITATIONS: meal prep, cleaning, laundry, driving, shopping, community activity, occupation, and yard work  [facility] POTENTIAL: Good  CLINICAL DECISION MAKING: Stable/uncomplicated  EVALUATION COMPLEXITY: Low   GOALS: Goals reviewed with patient? No  SHORT TERM GOALS: Target date: 11/01/2022  patient will be independent with initial HEP Baseline: 11/14/22:  Reports compliance with HEP daily Goal status: MET  2.  Patient will self report 50% improvement to improve tolerance for functional activity  Baseline: 11/14/22:  70% improvement Goal status: IN PROGRESS    LONG TERM GOALS: Target date: 11/15/2022  Patient will be independent in self management strategies to improve quality of life and functional outcomes.  Baseline:  Goal status: IN PROGRESS  2.  Patient will self report 50% improvement to improve tolerance for functional activity  Baseline: 11/14/22:  70% improvement Goal status: IN PROGRESS  3.  Patient will increased cervical mobility by 40 degrees throughout to improve ability to scan for traffic with driving Baseline: see above Goal status: MET  4.  Patient will improve FOTO score by 10 points Baseline: 48; 11/14/22:  45% Goal status: IN PROGRESS  5.  Patient will increase   shoulder flexion MMTs to 4+ to 5/5 without pain to promote return to lifting milk out of the fridge without pain  Baseline: see above Goal status: MET  6.  Patient will increased bilateral shoulder flexion to 140 degrees or better to be able to reach overhead to fix her hair Baseline: see above, 11/14/22: see above, ROM goal met reports she continues to have difficulty reaching overhead due to fatigue and some pulling Goal status: IN PROGRESS   PLAN:  PT FREQUENCY: 2x/week  PT DURATION: 4 weeks  PLANNED INTERVENTIONS: Therapeutic exercises, Therapeutic activity, Neuromuscular re-education, Balance training, Gait training, Patient/Family education, Joint manipulation, Joint mobilization, Stair training, Orthotic/Fit training, DME instructions, Aquatic Therapy, Dry Needling, Electrical stimulation, Spinal manipulation, Spinal mobilization, Cryotherapy, Moist heat, Compression bandaging, scar mobilization, Splintting, Taping, Traction, Ultrasound, Ionotophoresis 4mg /ml Dexamethasone, and Manual therapy   PLAN FOR NEXT SESSION: UBE and overhead tasks, progress cervical mobility, postural strength and upper extremity strength as able; manual as needed  Becky Sax, LPTA/CLT; CBIS [phone]  Juel Burrow, PTA 12/04/2022, 12:23 PM  12:23 PM, 12/04/22

## 2022-12-05 ENCOUNTER — Ambulatory Visit: Payer: 59 | Admitting: Orthopedic Surgery

## 2022-12-09 ENCOUNTER — Encounter (HOSPITAL_COMMUNITY): Payer: 59 | Admitting: Physical Therapy

## 2022-12-09 ENCOUNTER — Encounter (HOSPITAL_COMMUNITY): Payer: 59

## 2022-12-09 DIAGNOSIS — M545 Low back pain, unspecified: Secondary | ICD-10-CM | POA: Diagnosis not present

## 2022-12-09 DIAGNOSIS — Z419 Encounter for procedure for purposes other than remedying health state, unspecified: Secondary | ICD-10-CM | POA: Diagnosis not present

## 2022-12-11 ENCOUNTER — Telehealth: Payer: Self-pay | Admitting: Orthopedic Surgery

## 2022-12-11 ENCOUNTER — Encounter (HOSPITAL_COMMUNITY): Payer: 59

## 2022-12-11 DIAGNOSIS — E7849 Other hyperlipidemia: Secondary | ICD-10-CM | POA: Diagnosis not present

## 2022-12-11 DIAGNOSIS — R7301 Impaired fasting glucose: Secondary | ICD-10-CM | POA: Diagnosis not present

## 2022-12-11 DIAGNOSIS — E038 Other specified hypothyroidism: Secondary | ICD-10-CM | POA: Diagnosis not present

## 2022-12-11 NOTE — Telephone Encounter (Signed)
Jacqueline [name] required per [employer] site

## 2022-12-11 NOTE — Telephone Encounter (Signed)
Dr. 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  She wanted to let us know due to upcoming surgery.  [phone]

## 2022-12-12 LAB — CBC WITH DIFFERENTIAL/PLATELET
Basophils Absolute: 0 10*3/uL (ref 0.0–0.2)
Basos: 1 %
EOS (ABSOLUTE): 0.4 10*3/uL (ref 0.0–0.4)
Eos: 9 %
Hematocrit: 36.5 % (ref 34.0–46.6)
Hemoglobin: 12.7 g/dL (ref 11.1–15.9)
Immature Grans (Abs): 0 10*3/uL (ref 0.0–0.1)
Immature Granulocytes: 0 %
Lymphocytes Absolute: 1.7 10*3/uL (ref 0.7–3.1)
Lymphs: 36 %
MCH: 31.2 pg (ref 26.6–33.0)
MCHC: 34.8 g/dL (ref 31.5–35.7)
MCV: 90 fL (ref 79–97)
Monocytes Absolute: 0.5 10*3/uL (ref 0.1–0.9)
Monocytes: 10 %
Neutrophils Absolute: 2.2 10*3/uL (ref 1.4–7.0)
Neutrophils: 44 %
Platelets: 233 10*3/uL (ref 150–450)
RBC: 4.07 x10E6/uL (ref 3.77–5.28)
RDW: 13.2 % (ref 11.7–15.4)
WBC: 4.9 10*3/uL (ref 3.4–10.8)

## 2022-12-12 LAB — LIPID PANEL
Chol/HDL Ratio: 3 ratio (ref 0.0–4.4)
Cholesterol, Total: 167 mg/dL (ref 100–199)
HDL: 55 mg/dL (ref >39)
LDL Chol Calc (NIH): 95 mg/dL (ref 0–99)
Triglycerides: 94 mg/dL (ref 0–149)
VLDL Cholesterol Cal: 17 mg/dL (ref 5–40)

## 2022-12-12 LAB — TSH+FREE T4
Free T4: 1.61 ng/dL (ref 0.82–1.77)
TSH: 1.06 u[IU]/mL (ref 0.450–4.500)

## 2022-12-12 LAB — HEMOGLOBIN A1C
Est. average glucose Bld gHb Est-mCnc: 128 mg/dL
Hgb A1c MFr Bld: 6.1 % — ABNORMAL HIGH (ref 4.8–5.6)

## 2022-12-12 NOTE — Progress Notes (Signed)
Please inform the patient her hemoglobin A1c has increased from 5.9 to 6.1.  This indicate that she is prediabetic and I recommend decreasing her intake of high sugar foods and beverages. All other labs are stable.

## 2022-12-18 ENCOUNTER — Telehealth: Payer: Self-pay | Admitting: Orthopedic Surgery

## 2022-12-18 NOTE — Telephone Encounter (Signed)
Pt came back in and stated that it has to have signature on it.

## 2022-12-18 NOTE — Telephone Encounter (Signed)
Pt presented to the office and stated that she is needing a script for a walker with a seat and brakes.

## 2022-12-18 NOTE — Telephone Encounter (Signed)
Dr. Mort Sawyers pt Corrie Dandy w/[facility] [phone] lvm stating she needs a new script for this pt.

## 2022-12-19 ENCOUNTER — Ambulatory Visit (INDEPENDENT_AMBULATORY_CARE_PROVIDER_SITE_OTHER): Payer: 59 | Admitting: Orthopedic Surgery

## 2022-12-19 ENCOUNTER — Encounter: Payer: Self-pay | Admitting: Orthopedic Surgery

## 2022-12-19 VITALS — Ht 62.0 in | Wt 181.0 lb

## 2022-12-19 DIAGNOSIS — M23301 Other meniscus derangements, unspecified lateral meniscus, left knee: Secondary | ICD-10-CM | POA: Diagnosis not present

## 2022-12-19 DIAGNOSIS — S83232A Complex tear of medial meniscus, current injury, left knee, initial encounter: Secondary | ICD-10-CM

## 2022-12-19 DIAGNOSIS — M1712 Unilateral primary osteoarthritis, left knee: Secondary | ICD-10-CM

## 2022-12-19 DIAGNOSIS — Z01818 Encounter for other preprocedural examination: Secondary | ICD-10-CM

## 2022-12-19 DIAGNOSIS — S83242A Other tear of medial meniscus, current injury, left knee, initial encounter: Secondary | ICD-10-CM | POA: Diagnosis not present

## 2022-12-19 DIAGNOSIS — G8918 Other acute postprocedural pain: Secondary | ICD-10-CM

## 2022-12-19 MED ORDER — OXYCODONE-ACETAMINOPHEN 5-325 MG PO TABS
1.0000 | ORAL_TABLET | ORAL | 0 refills | Status: DC | PRN
Start: 2022-12-24 — End: 2022-12-25

## 2022-12-19 NOTE — Progress Notes (Signed)
Patient: Jacqueline [name]           Date of Birth: 19-Oct-1960           MRN: [id number] Visit Date: 12/19/2022 Requested by: Gilmore Laroche, FNP [address] [place],  [place] [address] PCP: Gilmore Laroche, FNP    Chief Complaint  Patient presents with   Knee Pain    Follow up left knee    This is a preop evaluation for the left knee  The patient has a complex tear of the lateral meniscus with osteoarthritis of the left knee she complains of lateral and medial knee pain and instability in the left knee as well and she is requiring a cane and a brace for ambulation  She will require a walker postoperatively    Body mass index is 33.11 kg/m.   Problem list, medical hx, medications and allergies reviewed   Review of Systems  Constitutional:  Negative for fever.  Respiratory:  Negative for shortness of breath.   Cardiovascular:  Negative for chest pain.  Skin: Negative.   Neurological:  Negative for tingling and sensory change.     No Known Allergies  Ht 5\' 2"  (1.575 m)   Wt 181 lb (82.1 kg)   LMP 10/26/2016 (Exact Date)   BMI 33.11 kg/m    Physical exam: Physical Exam  General: Normal development grooming and hygiene  CDV: Normal pulse and perfusion  MS: Alert and oriented x 3  PSYCH: Mood and affect normal  NEURO: Sensation is intact  MSK:  Evaluation of the left knee she has medial and lateral joint pain she has normal passive range of motion with pain throughout the arc of flexion extension.  Ligaments are no instability.  Muscle tone and strength are normal and there is no atrophy in the quadriceps.  Small joint effusion is noted as well  Data reviewed:   MRI  IMPRESSION: 1. Moderate patellofemoral and lateral compartment cartilage degenerative changes. Mild medial compartment cartilage degenerative changes. 2. High-grade bucket-handle tear of the lateral meniscus with donor sites within the posterior horn, body, and anterior horn of  the lateral meniscus and flipped meniscal tissue into the intercondylar notch. 3. Mild joint effusion. 4. Minimal proximal patellar tendinosis.     Electronically Signed   By: Neita Garnet M.D.   On: 11/19/2022 12:38   Assessment and plan:  Encounter Diagnoses  Name Primary?   Unilateral primary osteoarthritis, left knee    Complex tear of medial meniscus of left knee as current injury, initial encounter    Lateral meniscus derangement, left    Acute post-operative pain Yes   The procedure has been fully reviewed with the patient; The risks and benefits of surgery have been discussed and explained and understood. Alternative treatment has also been reviewed, questions were encouraged and answered. The postoperative plan is also been reviewed.  Plan for arthroscopy left knee partial lateral meniscectomy and debridement of meniscus medially if needed  Meds ordered this encounter  Medications   oxyCODONE-acetaminophen (PERCOCET) 5-325 MG tablet    Sig: Take 1 tablet by mouth every 4 (four) hours as needed for up to 7 days for severe pain.    Dispense:  42 tablet    Refill:  0    Procedures:   None

## 2022-12-23 NOTE — Patient Instructions (Signed)
Jacqueline [name]  12/23/2022     @[email or web address] @   Your procedure is scheduled on  12/25/2022.   Report to [facility] at  0600  A.M.   Call this number if you have problems the morning of surgery:  [phone]  If you experience any cold or flu symptoms such as cough, fever, chills, shortness of breath, etc. between now and your scheduled surgery, please notify us at the above number.   Remember:  Do not eat or drink after midnight.    DO NOT take any medications for diabetes the morning of your procedure.    Take these medicines the morning of surgery with A SIP OF WATER       amlodipine, cymbalta, robaxin(if needed), oxycodone(if needed), protonix.     Do not wear jewelry, make-up or nail polish, including gel polish,  artificial nails, or any other type of covering on natural nails (fingers and  toes).  Do not wear lotions, powders, or perfumes, or deodorant.  Do not shave 48 hours prior to surgery.  Men may shave face and neck.  Do not bring valuables to the hospital.  [facility] is not responsible for any belongings or valuables.  Contacts, dentures or bridgework may not be worn into surgery.  Leave your suitcase in the car.  After surgery it may be brought to your room.  For patients admitted to the hospital, discharge time will be determined by your treatment team.  Patients discharged the day of surgery will not be allowed to drive home and must have someone with them for 24 hours.    Special instructions:   DO NOT smoke tobacco or vape for 24 hours before your procedure.  Please read over the following fact sheets that you were given. Pain Booklet, Coughing and Deep Breathing, Surgical Site Infection Prevention, Anesthesia Post-op Instructions, and Care and Recovery After Surgery       Arthroscopic Knee Ligament Repair, Care After This sheet gives you information about how to care for yourself after your procedure. Your health care provider  may also give you more specific instructions. If you have problems or questions, contact your health care provider. What can I expect after the procedure? After the procedure, it is common to have: Soreness or pain in your knee. Bruising and swelling on your knee, calf, and ankle for 3-4 days. A small amount of fluid coming from the incisions. Follow these instructions at home: Medicines Take over-the-counter and prescription medicines only as told by your health care provider. Ask your health care provider if the medicine prescribed to you: Requires you to avoid driving or using machinery. Can cause constipation. You may need to take these actions to prevent or treat constipation: Drink enough fluid to keep your urine pale yellow. Take over-the-counter or prescription medicines. Eat foods that are high in fiber, such as beans, whole grains, and fresh fruits and vegetables. Limit foods that are high in fat and processed sugars, such as fried or sweet foods. If you have a brace or immobilizer: Wear it as told by your health care provider. Remove it only as told by your health care provider. Loosen it if your toes tingle, become numb, or turn cold and blue. Keep it clean and dry. Ask your health care provider when it is safe to drive. Bathing Do not take baths, swim, or use a hot tub until your health care provider approves. Keep your bandage (dressing) dry until your  health care provider says that it can be removed. If the brace or immobilizer is not waterproof: Do not let it get wet. Cover it with a watertight covering when you take a bath or shower. Incision care  Follow instructions from your health care provider about how to take care of your incisions. Make sure you: Wash your hands with soap and water for at least 20 seconds before and after you change your dressing. If soap and water are not available, use hand sanitizer. Change your dressing as told by your health care  provider. Leave stitches (sutures), skin glue, or adhesive strips in place. These skin closures may need to stay in place for 2 weeks or longer. If adhesive strip edges start to loosen and curl up, you may trim the loose edges. Do not remove adhesive strips completely unless your health care provider tells you to do that. Check your incision areas every day for signs of infection. Check for: Redness. More swelling or pain. Blood or more fluid. Warmth. Pus or a bad smell. Managing pain, stiffness, and swelling  If directed, put ice on the affected area. To do this: If you have a removable brace or immobilizer, remove it as told by your health care provider. Put ice in a plastic bag. Place a towel between your skin and the bag. Leave the ice on for 20 minutes, 2-3 times a day. Remove the ice if your skin turns bright red. This is very important. If you cannot feel pain, heat, or cold, you have a greater risk of damage to the area. Move your toes often to reduce stiffness and swelling. Raise (elevate) the injured area above the level of your heart while you are sitting or lying down. Activity Do not use your knee to support your body weight until your health care provider says that you can. Use crutches or other devices as told by your health care provider. Do physical therapy exercises as told by your health care provider. Physical therapy will help you regain movement and strength in your knee. Follow instructions from your health care provider about: When you may start motion exercises. When you may start riding a stationary bike and doing other low-impact activities. When you may start to jog and do other high-impact activities. Do not lift anything that is heavier than 10 lb (4.5 kg), or the limit that you are told, until your health care provider says that it is safe. Ask your health care provider what activities are safe for you. General instructions Do not use any products that  contain nicotine or tobacco, such as cigarettes, e-cigarettes, and chewing tobacco. These can delay healing. If you need help quitting, ask your health care provider. Wear compression stockings as told by your health care provider. These stockings help to prevent blood clots and reduce swelling in your legs. Keep all follow-up visits. This is important. Contact a health care provider if: You have any of these signs of infection: Redness around an incision. Blood or more fluid coming from an incision. Warmth coming from an incision. Pus or a bad smell coming from an incision. More swelling or pain in your knee. A fever or chills. You have pain that does not get better with medicine. Your incision opens up. Get help right away if: You have trouble breathing. You have chest pain. You have increased pain or swelling in your calf or at the back of your knee. You have numbness and tingling near the knee joint or in  the foot, ankle, or toes. You notice that your foot or toes look darker than normal or are cooler than normal. These symptoms may represent a serious problem that is an emergency. Do not wait to see if the symptoms will go away. Get medical help right away. Call your local emergency services (911 in the [place] Do not drive yourself to the hospital. Summary After the procedure, it is common to have knee pain with bruising and swelling on your knee, calf, and ankle. Icing your knee and raising your leg above the level of your heart will help control the pain and swelling. Do physical therapy exercises as told by your health care provider. Physical therapy will help you regain movement and strength in your knee. This information is not intended to replace advice given to you by your health care provider. Make sure you discuss any questions you have with your health care provider. Document Revised: 10/25/2019 Document Reviewed: 10/25/2019 Elsevier Patient Education  2024 [employer] General Anesthesia, Adult, Care After The following information offers guidance on how to care for yourself after your procedure. Your health care provider may also give you more specific instructions. If you have problems or questions, contact your health care provider. What can I expect after the procedure? After the procedure, it is common for people to: Have pain or discomfort at the IV site. Have nausea or vomiting. Have a sore throat or hoarseness. Have trouble concentrating. Feel cold or chills. Feel weak, sleepy, or tired (fatigue). Have soreness and body aches. These can affect parts of the body that were not involved in surgery. Follow these instructions at home: For the time period you were told by your health care provider:  Rest. Do not participate in activities where you could fall or become injured. Do not drive or use machinery. Do not drink alcohol. Do not take sleeping pills or medicines that cause drowsiness. Do not make important decisions or sign legal documents. Do not take care of children on your own. General instructions Drink enough fluid to keep your urine pale yellow. If you have sleep apnea, surgery and certain medicines can increase your risk for breathing problems. Follow instructions from your health care provider about wearing your sleep device: Anytime you are sleeping, including during daytime naps. While taking prescription pain medicines, sleeping medicines, or medicines that make you drowsy. Return to your normal activities as told by your health care provider. Ask your health care provider what activities are safe for you. Take over-the-counter and prescription medicines only as told by your health care provider. Do not use any products that contain nicotine or tobacco. These products include cigarettes, chewing tobacco, and vaping devices, such as e-cigarettes. These can delay incision healing after surgery. If you need help quitting, ask your  health care provider. Contact a health care provider if: You have nausea or vomiting that does not get better with medicine. You vomit every time you eat or drink. You have pain that does not get better with medicine. You cannot urinate or have bloody urine. You develop a skin rash. You have a fever. Get help right away if: You have trouble breathing. You have chest pain. You vomit blood. These symptoms may be an emergency. Get help right away. Call 911. Do not wait to see if the symptoms will go away. Do not drive yourself to the hospital. Summary After the procedure, it is common to have a sore throat, hoarseness, nausea, vomiting, or to feel weak, sleepy,  or fatigue. For the time period you were told by your health care provider, do not drive or use machinery. Get help right away if you have difficulty breathing, have chest pain, or vomit blood. These symptoms may be an emergency. This information is not intended to replace advice given to you by your health care provider. Make sure you discuss any questions you have with your health care provider. Document Revised: 08/24/2021 Document Reviewed: 08/24/2021 Elsevier Patient Education  2024 [employer] How to Use Chlorhexidine Before Surgery Chlorhexidine gluconate (CHG) is a germ-killing (antiseptic) solution that is used to clean the skin. It can get rid of the bacteria that normally live on the skin and can keep them away for about 24 hours. To clean your skin with CHG, you may be given: A CHG solution to use in the shower or as part of a sponge bath. A prepackaged cloth that contains CHG. Cleaning your skin with CHG may help lower the risk for infection: While you are staying in the intensive care unit of the hospital. If you have a vascular access, such as a central line, to provide short-term or long-term access to your veins. If you have a catheter to drain urine from your bladder. If you are on a ventilator. A ventilator is  a machine that helps you breathe by moving air in and out of your lungs. After surgery. What are the risks? Risks of using CHG include: A skin reaction. Hearing loss, if CHG gets in your ears and you have a perforated eardrum. Eye injury, if CHG gets in your eyes and is not rinsed out. The CHG product catching fire. Make sure that you avoid smoking and flames after applying CHG to your skin. Do not use CHG: If you have a chlorhexidine allergy or have previously reacted to chlorhexidine. On babies younger than 65 months of age. How to use CHG solution Use CHG only as told by your health care provider, and follow the instructions on the label. Use the full amount of CHG as directed. Usually, this is one bottle. During a shower Follow these steps when using CHG solution during a shower (unless your health care provider gives you different instructions): Start the shower. Use your normal soap and shampoo to wash your face and hair. Turn off the shower or move out of the shower stream. Pour the CHG onto a clean washcloth. Do not use any type of brush or rough-edged sponge. Starting at your neck, lather your body down to your toes. Make sure you follow these instructions: If you will be having surgery, pay special attention to the part of your body where you will be having surgery. Scrub this area for at least 1 minute. Do not use CHG on your head or face. If the solution gets into your ears or eyes, rinse them well with water. Avoid your genital area. Avoid any areas of skin that have broken skin, cuts, or scrapes. Scrub your back and under your arms. Make sure to wash skin folds. Let the lather sit on your skin for 1-2 minutes or as long as told by your health care provider. Thoroughly rinse your entire body in the shower. Make sure that all body creases and crevices are rinsed well. Dry off with a clean towel. Do not put any substances on your body afterward--such as powder, lotion, or  perfume--unless you are told to do so by your health care provider. Only use lotions that are recommended by the manufacturer. Put  on clean clothes or pajamas. If it is the night before your surgery, sleep in clean sheets.  During a sponge bath Follow these steps when using CHG solution during a sponge bath (unless your health care provider gives you different instructions): Use your normal soap and shampoo to wash your face and hair. Pour the CHG onto a clean washcloth. Starting at your neck, lather your body down to your toes. Make sure you follow these instructions: If you will be having surgery, pay special attention to the part of your body where you will be having surgery. Scrub this area for at least 1 minute. Do not use CHG on your head or face. If the solution gets into your ears or eyes, rinse them well with water. Avoid your genital area. Avoid any areas of skin that have broken skin, cuts, or scrapes. Scrub your back and under your arms. Make sure to wash skin folds. Let the lather sit on your skin for 1-2 minutes or as long as told by your health care provider. Using a different clean, wet washcloth, thoroughly rinse your entire body. Make sure that all body creases and crevices are rinsed well. Dry off with a clean towel. Do not put any substances on your body afterward--such as powder, lotion, or perfume--unless you are told to do so by your health care provider. Only use lotions that are recommended by the manufacturer. Put on clean clothes or pajamas. If it is the night before your surgery, sleep in clean sheets. How to use CHG prepackaged cloths Only use CHG cloths as told by your health care provider, and follow the instructions on the label. Use the CHG cloth on clean, dry skin. Do not use the CHG cloth on your head or face unless your health care provider tells you to. When washing with the CHG cloth: Avoid your genital area. Avoid any areas of skin that have broken  skin, cuts, or scrapes. Before surgery Follow these steps when using a CHG cloth to clean before surgery (unless your health care provider gives you different instructions): Using the CHG cloth, vigorously scrub the part of your body where you will be having surgery. Scrub using a back-and-forth motion for 3 minutes. The area on your body should be completely wet with CHG when you are done scrubbing. Do not rinse. Discard the cloth and let the area air-dry. Do not put any substances on the area afterward, such as powder, lotion, or perfume. Put on clean clothes or pajamas. If it is the night before your surgery, sleep in clean sheets.  For general bathing Follow these steps when using CHG cloths for general bathing (unless your health care provider gives you different instructions). Use a separate CHG cloth for each area of your body. Make sure you wash between any folds of skin and between your fingers and toes. Wash your body in the following order, switching to a new cloth after each step: The front of your neck, shoulders, and chest. Both of your arms, under your arms, and your hands. Your stomach and groin area, avoiding the genitals. Your right leg and foot. Your left leg and foot. The back of your neck, your back, and your buttocks. Do not rinse. Discard the cloth and let the area air-dry. Do not put any substances on your body afterward--such as powder, lotion, or perfume--unless you are told to do so by your health care provider. Only use lotions that are recommended by the manufacturer. Put on clean clothes  or pajamas. Contact a health care provider if: Your skin gets irritated after scrubbing. You have questions about using your solution or cloth. You swallow any chlorhexidine. Call your local poison control center ([phone] in the [place] Get help right away if: Your eyes itch badly, or they become very red or swollen. Your skin itches badly and is red or swollen. Your  hearing changes. You have trouble seeing. You have swelling or tingling in your mouth or throat. You have trouble breathing. These symptoms may represent a serious problem that is an emergency. Do not wait to see if the symptoms will go away. Get medical help right away. Call your local emergency services (911 in the [place] Do not drive yourself to the hospital. Summary Chlorhexidine gluconate (CHG) is a germ-killing (antiseptic) solution that is used to clean the skin. Cleaning your skin with CHG may help to lower your risk for infection. You may be given CHG to use for bathing. It may be in a bottle or in a prepackaged cloth to use on your skin. Carefully follow your health care provider's instructions and the instructions on the product label. Do not use CHG if you have a chlorhexidine allergy. Contact your health care provider if your skin gets irritated after scrubbing. This information is not intended to replace advice given to you by your health care provider. Make sure you discuss any questions you have with your health care provider. Document Revised: 09/24/2021 Document Reviewed: 08/07/2020 Elsevier Patient Education  2023 [employer].

## 2022-12-24 ENCOUNTER — Encounter (HOSPITAL_COMMUNITY): Payer: Self-pay

## 2022-12-24 ENCOUNTER — Encounter (HOSPITAL_COMMUNITY)
Admission: RE | Admit: 2022-12-24 | Discharge: 2022-12-24 | Disposition: A | Discharge status: Home / Self Care | Payer: 59 | Source: Ambulatory Visit | Attending: Orthopedic Surgery | Admitting: Orthopedic Surgery

## 2022-12-24 DIAGNOSIS — S83232A Complex tear of medial meniscus, current injury, left knee, initial encounter: Secondary | ICD-10-CM | POA: Diagnosis not present

## 2022-12-24 DIAGNOSIS — Z01812 Encounter for preprocedural laboratory examination: Secondary | ICD-10-CM | POA: Insufficient documentation

## 2022-12-24 DIAGNOSIS — X58XXXA Exposure to other specified factors, initial encounter: Secondary | ICD-10-CM | POA: Insufficient documentation

## 2022-12-24 HISTORY — DX: Sleep apnea, unspecified: G47.30

## 2022-12-24 HISTORY — DX: Cardiac murmur, unspecified: R01.1

## 2022-12-24 LAB — BASIC METABOLIC PANEL
Anion gap: 8 (ref 5–15)
BUN: 14 mg/dL (ref 6–20)
CO2: 25 mmol/L (ref 22–32)
Calcium: 9.2 mg/dL (ref 8.9–10.3)
Chloride: 103 mmol/L (ref 98–111)
Creatinine, Ser: 1.08 mg/dL — ABNORMAL HIGH (ref 0.44–1.00)
GFR, Estimated: 59 mL/min — ABNORMAL LOW (ref >60)
Glucose, Bld: 160 mg/dL — ABNORMAL HIGH (ref 70–99)
Potassium: 3.4 mmol/L — ABNORMAL LOW (ref 3.5–5.1)
Sodium: 136 mmol/L (ref 135–145)

## 2022-12-24 NOTE — H&P (Signed)
History and Physical for outpatient surgery   Dx: left knee meniscus tear   Plan :   Plan for arthroscopy left knee partial lateral meniscectomy and debridement of meniscus medially if needed    Patient: Jacqueline [name]                                           Date of Birth: 1960/08/04                                                   MRN: [id number] Visit Date: 12/19/2022 Requested by: Gilmore Laroche, FNP [address] [place],  [place] [address] PCP: Gilmore Laroche, FNP           Chief Complaint  Patient presents with   Knee Pain      Follow up left knee      This is a preop evaluation for the left knee   The patient has a complex tear of the lateral meniscus with osteoarthritis of the left knee she complains of lateral and medial knee pain and instability in the left knee as well and she is requiring a cane and a brace for ambulation   She will require a walker postoperatively       Body mass index is 33.11 kg/m.     Problem list, medical hx, medications and allergies reviewed   Past Medical History:  Diagnosis Date   Anemia    Arthritis    per patient, back   Complication of anesthesia 2018   per patient HR dropped during partial hysterectomy surgery   Fibroid uterus    GERD (gastroesophageal reflux disease)    H/O total hysterectomy    Heart murmur    Hyperlipidemia    Borderline   Hypertension    Numbness and tingling in right hand    Pre-diabetes    borderline   Sleep apnea    Past Surgical History:  Procedure Laterality Date   BACK SURGERY     BALLOON DILATION N/A 05/22/2021   Procedure: BALLOON DILATION;  Surgeon: Lanelle Bal, DO;  [facility];  Service: Endoscopy;  Laterality: N/A;   BIOPSY  11/18/2022   Procedure: BIOPSY;  Surgeon: Corbin Ade, MD;  [facility];  Service: Endoscopy;;   CHOLECYSTECTOMY     COLONOSCOPY  2003   Dr. Jena Gauss: Anal canal hemorrhoids   COLONOSCOPY N/A 08/25/2014   Normal  colonoscopy.  Next colonoscopy in 2026.  Procedure: COLONOSCOPY;  Surgeon: Corbin Ade, MD;  [facility];  Service: Endoscopy;  Laterality: N/A;  1200   ESOPHAGOGASTRODUODENOSCOPY N/A 08/25/2014   Procedure: ESOPHAGOGASTRODUODENOSCOPY (EGD);  Surgeon: Corbin Ade, MD;  [facility];  Service: Endoscopy;  Laterality: N/A;   ESOPHAGOGASTRODUODENOSCOPY (EGD) WITH PROPOFOL N/A 05/22/2021   Web and distal esophagus, dilated, gastriti, biopsy positive for mildly active H. pylori gastritis.  s procedure: ESOPHAGOGASTRODUODENOSCOPY (EGD) WITH PROPOFOL;  Surgeon: Lanelle Bal, DO;  [facility];  Service: Endoscopy;  Laterality: N/A;  9:15am   ESOPHAGOGASTRODUODENOSCOPY (EGD) WITH PROPOFOL N/A 11/18/2022   Procedure: ESOPHAGOGASTRODUODENOSCOPY (EGD) WITH PROPOFOL;  Surgeon: Corbin Ade, MD;  [facility];  Service: Endoscopy;  Laterality:  N/A;  2:15 pm, asa 2   MALONEY DILATION N/A 11/18/2022   Procedure: MALONEY DILATION;  Surgeon: Corbin Ade, MD;  [facility];  Service: Endoscopy;  Laterality: N/A;   SALPINGOOPHORECTOMY Bilateral 11/06/2016   Procedure: BILATERAL SALPINGO OOPHORECTOMY;  Surgeon: Lazaro Arms, MD;  Location: [facility];  Service: Gynecology;  Laterality: Bilateral;   SUPRACERVICAL ABDOMINAL HYSTERECTOMY N/A 11/06/2016   Procedure: HYSTERECTOMY SUPRACERVICAL ABDOMINAL;  Surgeon: Lazaro Arms, MD;  Location: [facility];  Service: Gynecology;  Laterality: N/A;   TRANSFORAMINAL LUMBAR INTERBODY FUSION (TLIF) WITH PEDICLE SCREW FIXATION 2 LEVEL Left 07/28/2019   Procedure: LEFT-SIDED LUMBAR 4- 5, LUMBAR 5 -SACRUM1 TRANSFORAMINAL LUMBAR INTERBODY FUSION WITH INSTRUMENTATION AND ALLOGRAFT;  Surgeon: Estill Bamberg, MD;  Location: [facility];  Service: Orthopedics;  Laterality: Left;   TUBAL LIGATION     Family History  Problem Relation Age of Onset   Breast cancer Maternal Aunt    Hypertension Maternal Aunt    Cancer Maternal  Aunt        Breast   Cancer Maternal Grandmother         lymphnodes   Colon cancer Neg Hx    Social History   Tobacco Use   Smoking status: Never   Smokeless tobacco: Never   Tobacco comments:    Never smoked  Vaping Use   Vaping status: Never Used  Substance Use Topics   Alcohol use: No    Alcohol/week: 0.0 standard drinks of alcohol   Drug use: No   Current Outpatient Medications  Medication Instructions   acetaminophen (TYLENOL) 650 mg, Oral, Every 8 hours PRN   acyclovir (ZOVIRAX) 400 mg, Oral, 3 times daily PRN   amLODipine (NORVASC) 5 mg, Oral, Daily   atorvastatin (LIPITOR) 20 mg, Oral, Daily   azelastine (ASTELIN) 0.1 % nasal spray 1 spray, Each Nare, 2 times daily PRN, Use in each nostril as directed   benzonatate (TESSALON PERLES) 100 mg, Oral, 3 times daily PRN   clotrimazole-betamethasone (LOTRISONE) cream 1 Application, Topical, 2 times daily PRN   diclofenac (VOLTAREN) 75 mg, Oral, 2 times daily with meals   DULoxetine (CYMBALTA) 30 mg, Oral, Daily   estradiol (ESTRACE) 0.1 MG/GM vaginal cream INSERT 2 GRAMS VAGINALLY AS DIRECTED EVERY OTHER NIGHT   fluconazole (DIFLUCAN) 150 MG tablet Take one tablet by mouth once daily as needed for vaginal itch   fluticasone (FLONASE) 50 MCG/ACT nasal spray 1 spray, Each Nare, Daily   methocarbamol (ROBAXIN-750) 750 mg, Oral, 4 times daily   Multiple Vitamin (MULTIVITAMIN WITH MINERALS) TABS tablet 1 tablet, Oral, Daily with supper, One-A-Day Multivitamin   oxyCODONE-acetaminophen (PERCOCET) 5-325 MG tablet 1 tablet, Oral, Every 4 hours PRN   pantoprazole (PROTONIX) 40 mg, Oral, 2 times daily before meals   penicillin v potassium (VEETID) 500 mg, Oral, 3 times daily   sennosides-docusate sodium (SENOKOT-S) 8.6-50 MG tablet 1 tablet, Oral, Daily PRN   traZODone (DESYREL) 25-50 mg, Oral, At bedtime PRN       Review of Systems  Constitutional:  Negative for fever.  Respiratory:  Negative for shortness of breath.    Cardiovascular:  Negative for chest pain.  Skin: Negative.   Neurological:  Negative for tingling and sensory change.        Allergies  No Known Allergies     Ht 5\' 2"  (1.575 m)   Wt 181 lb (82.1 kg)   LMP 10/26/2016 (Exact Date)   BMI 33.11 kg/m      Physical  exam: Physical Exam   General: Normal development grooming and hygiene   CDV: Normal pulse and perfusion   MS: Alert and oriented x 3   PSYCH: Mood and affect normal   NEURO: Sensation is intact   MSK:   Evaluation of the left knee she has medial and lateral joint pain she has normal passive range of motion with pain throughout the arc of flexion extension.  Ligaments are no instability.  Muscle tone and strength are normal and there is no atrophy in the quadriceps.  Small joint effusion is noted as well   Data reviewed:    MRI  IMPRESSION: 1. Moderate patellofemoral and lateral compartment cartilage degenerative changes. Mild medial compartment cartilage degenerative changes. 2. High-grade bucket-handle tear of the lateral meniscus with donor sites within the posterior horn, body, and anterior horn of the lateral meniscus and flipped meniscal tissue into the intercondylar notch. 3. Mild joint effusion. 4. Minimal proximal patellar tendinosis.     Electronically Signed   By: Neita Garnet M.D.   On: 11/19/2022 12:38     Assessment and plan:       Encounter Diagnoses  Name Primary?   Unilateral primary osteoarthritis, left knee     Complex tear of medial meniscus of left knee as current injury, initial encounter     Lateral meniscus derangement, left          The procedure has been fully reviewed with the patient; The risks and benefits of surgery have been discussed and explained and understood. Alternative treatment has also been reviewed, questions were encouraged and answered. The postoperative plan is also been reviewed.  Plan for arthroscopy left knee partial lateral meniscectomy and  debridement of meniscus medially if needed    Post op Rx      Meds ordered this encounter  Medications   oxyCODONE-acetaminophen (PERCOCET) 5-325 MG tablet      Sig: Take 1 tablet by mouth every 4 (four) hours as needed for up to 7 days for severe pain.      Dispense:  42 tablet      Refill:  0

## 2022-12-25 ENCOUNTER — Encounter (HOSPITAL_COMMUNITY)
Admission: RE | Disposition: A | Discharge status: Home / Self Care | Payer: Self-pay | Source: Home / Self Care | Attending: Orthopedic Surgery

## 2022-12-25 ENCOUNTER — Ambulatory Visit (HOSPITAL_COMMUNITY)
Admission: RE | Admit: 2022-12-25 | Discharge: 2022-12-25 | Disposition: A | Discharge status: Home / Self Care | Payer: 59 | Attending: Orthopedic Surgery | Admitting: Orthopedic Surgery

## 2022-12-25 ENCOUNTER — Ambulatory Visit (HOSPITAL_BASED_OUTPATIENT_CLINIC_OR_DEPARTMENT_OTHER): Payer: 59 | Admitting: Anesthesiology

## 2022-12-25 ENCOUNTER — Other Ambulatory Visit: Payer: Self-pay

## 2022-12-25 ENCOUNTER — Ambulatory Visit (HOSPITAL_COMMUNITY): Payer: 59 | Admitting: Anesthesiology

## 2022-12-25 ENCOUNTER — Encounter (HOSPITAL_COMMUNITY): Payer: Self-pay | Admitting: Orthopedic Surgery

## 2022-12-25 DIAGNOSIS — M1712 Unilateral primary osteoarthritis, left knee: Secondary | ICD-10-CM | POA: Diagnosis not present

## 2022-12-25 DIAGNOSIS — G4730 Sleep apnea, unspecified: Secondary | ICD-10-CM | POA: Diagnosis not present

## 2022-12-25 DIAGNOSIS — S83252D Bucket-handle tear of lateral meniscus, current injury, left knee, subsequent encounter: Secondary | ICD-10-CM

## 2022-12-25 DIAGNOSIS — S83232A Complex tear of medial meniscus, current injury, left knee, initial encounter: Secondary | ICD-10-CM | POA: Diagnosis not present

## 2022-12-25 DIAGNOSIS — E785 Hyperlipidemia, unspecified: Secondary | ICD-10-CM

## 2022-12-25 DIAGNOSIS — X58XXXA Exposure to other specified factors, initial encounter: Secondary | ICD-10-CM | POA: Insufficient documentation

## 2022-12-25 DIAGNOSIS — S83249A Other tear of medial meniscus, current injury, unspecified knee, initial encounter: Secondary | ICD-10-CM

## 2022-12-25 DIAGNOSIS — S83512A Sprain of anterior cruciate ligament of left knee, initial encounter: Secondary | ICD-10-CM

## 2022-12-25 DIAGNOSIS — S83252A Bucket-handle tear of lateral meniscus, current injury, left knee, initial encounter: Secondary | ICD-10-CM

## 2022-12-25 DIAGNOSIS — I10 Essential (primary) hypertension: Secondary | ICD-10-CM | POA: Diagnosis not present

## 2022-12-25 DIAGNOSIS — S83512D Sprain of anterior cruciate ligament of left knee, subsequent encounter: Secondary | ICD-10-CM

## 2022-12-25 HISTORY — PX: KNEE ARTHROSCOPY WITH MEDIAL MENISECTOMY: SHX5651

## 2022-12-25 LAB — GLUCOSE, CAPILLARY
Glucose-Capillary: 83 mg/dL (ref 70–99)
Glucose-Capillary: 86 mg/dL (ref 70–99)

## 2022-12-25 SURGERY — ARTHROSCOPY, KNEE, WITH MEDIAL MENISCECTOMY
Anesthesia: General | Site: Knee | Laterality: Left

## 2022-12-25 MED ORDER — LIDOCAINE HCL (PF) 2 % IJ SOLN
INTRAMUSCULAR | Status: AC
  Filled 2022-12-25: qty 5

## 2022-12-25 MED ORDER — MEPERIDINE HCL 50 MG/ML IJ SOLN: 6.2500 mg | INTRAMUSCULAR | Status: DC | PRN

## 2022-12-25 MED ORDER — ACETAMINOPHEN 500 MG PO TABS
500.0000 mg | ORAL_TABLET | Freq: Once | ORAL | Status: AC
  Administered 2022-12-25: 500 mg via ORAL
  Filled 2022-12-25: qty 1

## 2022-12-25 MED ORDER — DEXAMETHASONE SODIUM PHOSPHATE 10 MG/ML IJ SOLN
10.0000 mg | Freq: Once | INTRAMUSCULAR | Status: DC
  Filled 2022-12-25: qty 1

## 2022-12-25 MED ORDER — MIDAZOLAM HCL 2 MG/2ML IJ SOLN
INTRAMUSCULAR | Status: AC
  Filled 2022-12-25: qty 2

## 2022-12-25 MED ORDER — KETOROLAC TROMETHAMINE 30 MG/ML IJ SOLN
INTRAMUSCULAR | Status: DC | PRN
  Administered 2022-12-25: 15 mg via INTRAVENOUS

## 2022-12-25 MED ORDER — PROPOFOL 10 MG/ML IV BOLUS
INTRAVENOUS | Status: DC | PRN
  Administered 2022-12-25: 200 mg via INTRAVENOUS

## 2022-12-25 MED ORDER — FENTANYL CITRATE (PF) 250 MCG/5ML IJ SOLN
INTRAMUSCULAR | Status: DC | PRN
  Administered 2022-12-25 (×3): 25 ug via INTRAVENOUS

## 2022-12-25 MED ORDER — DEXAMETHASONE SODIUM PHOSPHATE 10 MG/ML IJ SOLN: 10.0000 mg | Freq: Once | INTRAMUSCULAR | Status: DC

## 2022-12-25 MED ORDER — LIDOCAINE HCL (CARDIAC) PF 100 MG/5ML IV SOSY
PREFILLED_SYRINGE | INTRAVENOUS | Status: DC | PRN
  Administered 2022-12-25: 100 mg via INTRAVENOUS

## 2022-12-25 MED ORDER — EPHEDRINE 5 MG/ML INJ
INTRAVENOUS | Status: AC
  Filled 2022-12-25: qty 5

## 2022-12-25 MED ORDER — PHENYLEPHRINE 80 MCG/ML (10ML) SYRINGE FOR IV PUSH (FOR BLOOD PRESSURE SUPPORT)
PREFILLED_SYRINGE | INTRAVENOUS | Status: DC | PRN
  Administered 2022-12-25 (×5): 80 ug via INTRAVENOUS

## 2022-12-25 MED ORDER — BUPIVACAINE-EPINEPHRINE (PF) 0.5% -1:200000 IJ SOLN
INTRAMUSCULAR | Status: AC
  Filled 2022-12-25: qty 30

## 2022-12-25 MED ORDER — EPHEDRINE SULFATE (PRESSORS) 50 MG/ML IJ SOLN
INTRAMUSCULAR | Status: DC | PRN
  Administered 2022-12-25: 5 mg via INTRAVENOUS
  Administered 2022-12-25 (×2): 10 mg via INTRAVENOUS

## 2022-12-25 MED ORDER — MIDAZOLAM HCL 2 MG/2ML IJ SOLN
INTRAMUSCULAR | Status: DC | PRN
  Administered 2022-12-25: 2 mg via INTRAVENOUS

## 2022-12-25 MED ORDER — DEXAMETHASONE SODIUM PHOSPHATE 10 MG/ML IJ SOLN
INTRAMUSCULAR | Status: AC
  Filled 2022-12-25: qty 1

## 2022-12-25 MED ORDER — FENTANYL CITRATE (PF) 100 MCG/2ML IJ SOLN
INTRAMUSCULAR | Status: AC
  Filled 2022-12-25: qty 2

## 2022-12-25 MED ORDER — SODIUM CHLORIDE 0.9 % IR SOLN
Status: DC | PRN
  Administered 2022-12-25 (×3): 1 mL

## 2022-12-25 MED ORDER — CHLORHEXIDINE GLUCONATE 0.12 % MT SOLN
15.0000 mL | Freq: Once | OROMUCOSAL | Status: AC
  Administered 2022-12-25: 15 mL via OROMUCOSAL

## 2022-12-25 MED ORDER — CEFAZOLIN SODIUM-DEXTROSE 2-4 GM/100ML-% IV SOLN
2.0000 g | INTRAVENOUS | Status: AC
  Administered 2022-12-25: 2 g via INTRAVENOUS

## 2022-12-25 MED ORDER — ONDANSETRON HCL 4 MG/2ML IJ SOLN
INTRAMUSCULAR | Status: DC | PRN
Start: 2022-12-25 — End: 2022-12-25
  Administered 2022-12-25: 4 mg via INTRAVENOUS

## 2022-12-25 MED ORDER — LACTATED RINGERS IV SOLN: INTRAVENOUS | Status: DC

## 2022-12-25 MED ORDER — ONDANSETRON HCL 4 MG/2ML IJ SOLN: 4.0000 mg | Freq: Once | INTRAMUSCULAR | Status: DC | PRN

## 2022-12-25 MED ORDER — KETOROLAC TROMETHAMINE 30 MG/ML IJ SOLN
INTRAMUSCULAR | Status: AC
  Filled 2022-12-25: qty 1

## 2022-12-25 MED ORDER — EPINEPHRINE PF 1 MG/ML IJ SOLN
INTRAMUSCULAR | Status: AC
  Filled 2022-12-25: qty 6

## 2022-12-25 MED ORDER — ONDANSETRON HCL 4 MG/2ML IJ SOLN
INTRAMUSCULAR | Status: AC
  Filled 2022-12-25: qty 2

## 2022-12-25 MED ORDER — OXYCODONE HCL 5 MG PO TABS
5.0000 mg | ORAL_TABLET | Freq: Once | ORAL | Status: AC
  Administered 2022-12-25: 5 mg via ORAL
  Filled 2022-12-25: qty 1

## 2022-12-25 MED ORDER — BUPIVACAINE-EPINEPHRINE (PF) 0.5% -1:200000 IJ SOLN
INTRAMUSCULAR | Status: DC | PRN
  Administered 2022-12-25: 30 mL via PERINEURAL

## 2022-12-25 MED ORDER — OXYCODONE-ACETAMINOPHEN 5-325 MG PO TABS: 1.0000 | ORAL_TABLET | ORAL | 0 refills | Status: AC | PRN

## 2022-12-25 MED ORDER — PROPOFOL 10 MG/ML IV BOLUS
INTRAVENOUS | Status: AC
  Filled 2022-12-25: qty 20

## 2022-12-25 MED ORDER — IBUPROFEN 800 MG PO TABS: 800.0000 mg | ORAL_TABLET | Freq: Three times a day (TID) | ORAL | 1 refills | Status: AC | PRN

## 2022-12-25 MED ORDER — DEXAMETHASONE SODIUM PHOSPHATE 10 MG/ML IJ SOLN
INTRAMUSCULAR | Status: DC | PRN
  Administered 2022-12-25: 10 mg via INTRAVENOUS

## 2022-12-25 MED ORDER — ORAL CARE MOUTH RINSE: 15.0000 mL | Freq: Once | OROMUCOSAL | Status: AC

## 2022-12-25 MED ORDER — PHENYLEPHRINE 80 MCG/ML (10ML) SYRINGE FOR IV PUSH (FOR BLOOD PRESSURE SUPPORT)
PREFILLED_SYRINGE | INTRAVENOUS | Status: AC
  Filled 2022-12-25: qty 10

## 2022-12-25 MED ORDER — SODIUM CHLORIDE 0.9 % IR SOLN
Status: DC | PRN
  Administered 2022-12-25: 1000 mL

## 2022-12-25 MED ORDER — KETOROLAC TROMETHAMINE 30 MG/ML IJ SOLN: 30.0000 mg | Freq: Once | INTRAMUSCULAR | Status: DC

## 2022-12-25 MED ORDER — HYDROMORPHONE HCL 1 MG/ML IJ SOLN: 0.2500 mg | INTRAMUSCULAR | Status: DC | PRN

## 2022-12-25 MED ORDER — CEFAZOLIN SODIUM-DEXTROSE 2-4 GM/100ML-% IV SOLN
INTRAVENOUS | Status: AC
  Filled 2022-12-25: qty 100

## 2022-12-25 SURGICAL SUPPLY — 54 items
ABLATOR ASPIRATE 50D MULTI-PRT (SURGICAL WAND) IMPLANT
APL PRP STRL LF DISP 70% ISPRP (MISCELLANEOUS) ×1
BAG HAMPER (MISCELLANEOUS) ×1 IMPLANT
BLADE SHAVER TORPEDO 4X13 (MISCELLANEOUS) IMPLANT
BLADE SURG SZ11 CARB STEEL (BLADE) ×1 IMPLANT
BNDG CMPR STD VLCR NS LF 5.8X4 (GAUZE/BANDAGES/DRESSINGS) ×1
BNDG CMPR STD VLCR NS LF 5.8X6 (GAUZE/BANDAGES/DRESSINGS) ×1
BNDG ELASTIC 4X5.8 VLCR NS LF (GAUZE/BANDAGES/DRESSINGS) IMPLANT
BNDG ELASTIC 6X5.8 VLCR NS LF (GAUZE/BANDAGES/DRESSINGS) ×1 IMPLANT
CHLORAPREP W/TINT 26 (MISCELLANEOUS) ×1 IMPLANT
CLOTH BEACON ORANGE TIMEOUT ST (SAFETY) ×1 IMPLANT
COOLER ICEMAN CLASSIC (MISCELLANEOUS) ×1 IMPLANT
COVER LIGHT HANDLE STERIS (MISCELLANEOUS) ×2 IMPLANT
CUFF TOURN SGL QUICK 34 (TOURNIQUET CUFF) ×1
CUFF TRNQT CYL 34X4.125X (TOURNIQUET CUFF) IMPLANT
DECANTER SPIKE VIAL GLASS SM (MISCELLANEOUS) ×2 IMPLANT
DRAPE HALF SHEET 40X57 (DRAPES) ×1 IMPLANT
GAUZE 4X4 16PLY ~~LOC~~+RFID DBL (SPONGE) ×1 IMPLANT
GAUZE SPONGE 4X4 12PLY STRL (GAUZE/BANDAGES/DRESSINGS) ×1 IMPLANT
GAUZE SPONGE 4X4 12PLY STRL LF (GAUZE/BANDAGES/DRESSINGS) IMPLANT
GAUZE XEROFORM 5X9 LF (GAUZE/BANDAGES/DRESSINGS) ×1 IMPLANT
GLOVE BIOGEL PI IND STRL 7.0 (GLOVE) ×2 IMPLANT
GLOVE SS N UNI LF 8.5 STRL (GLOVE) ×1 IMPLANT
GLOVE SURG POLYISO LF SZ8 (GLOVE) ×1 IMPLANT
GOWN STRL REUS W/TWL LRG LVL3 (GOWN DISPOSABLE) ×1 IMPLANT
GOWN STRL REUS W/TWL XL LVL3 (GOWN DISPOSABLE) ×1 IMPLANT
IV NS IRRIG 3000ML ARTHROMATIC (IV SOLUTION) ×2 IMPLANT
KIT BLADEGUARD II DBL (SET/KITS/TRAYS/PACK) ×1 IMPLANT
KIT TURNOVER CYSTO (KITS) ×1 IMPLANT
MANIFOLD NEPTUNE II (INSTRUMENTS) ×1 IMPLANT
MARKER SKIN DUAL TIP RULER LAB (MISCELLANEOUS) ×1 IMPLANT
NDL HYPO 18GX1.5 BLUNT FILL (NEEDLE) ×1 IMPLANT
NDL HYPO 21X1.5 SAFETY (NEEDLE) ×1 IMPLANT
NDL SPNL 18GX3.5 QUINCKE PK (NEEDLE) ×1 IMPLANT
NEEDLE HYPO 18GX1.5 BLUNT FILL (NEEDLE) ×1 IMPLANT
NEEDLE HYPO 21X1.5 SAFETY (NEEDLE) ×1 IMPLANT
NEEDLE SPNL 18GX3.5 QUINCKE PK (NEEDLE) ×1 IMPLANT
NS IRRIG 1000ML POUR BTL (IV SOLUTION) ×1 IMPLANT
PACK ARTHROSCOPY WL (CUSTOM PROCEDURE TRAY) ×1 IMPLANT
PAD ABD 5X9 TENDERSORB (GAUZE/BANDAGES/DRESSINGS) ×1 IMPLANT
PAD ARMBOARD 7.5X6 YLW CONV (MISCELLANEOUS) ×1 IMPLANT
PAD COLD SHLDR SM WRAP-ON (PAD) IMPLANT
PAD COLD SHLDR WRAP-ON (PAD) IMPLANT
PAD FOR LEG HOLDER (MISCELLANEOUS) ×1 IMPLANT
PADDING CAST COTTON 6X4 STRL (CAST SUPPLIES) ×1 IMPLANT
PORT APPOLLO RF 90DEGREE MULTI (SURGICAL WAND) IMPLANT
POSITIONER HEAD 8X9X4 ADT (SOFTGOODS) ×1 IMPLANT
SET ARTHROSCOPY INST (INSTRUMENTS) ×1 IMPLANT
SET BASIN LINEN APH (SET/KITS/TRAYS/PACK) ×1 IMPLANT
SUT ETHILON 3 0 FSL (SUTURE) ×1 IMPLANT
SYR 10ML LL (SYRINGE) ×1 IMPLANT
SYR 30ML LL (SYRINGE) ×2 IMPLANT
TUBE CONNECTING 12X1/4 (SUCTIONS) ×2 IMPLANT
TUBING IN/OUT FLOW W/MAIN PUMP (TUBING) ×1 IMPLANT

## 2022-12-25 NOTE — Transfer of Care (Signed)
Immediate Anesthesia Transfer of Care Note  Patient: Jacqueline [name]  Procedure(s) Performed: KNEE ARTHROSCOPY WITH PARTIAL LATERAL MENISCECTOMY DEBRIDEMENT ACL (Left: Knee)  Patient Location: PACU  Anesthesia Type:General  Level of Consciousness: drowsy  Airway & Oxygen Therapy: Patient Spontanous Breathing and Patient connected to nasal cannula oxygen  Post-op Assessment: Report given to RN and Post -op Vital signs reviewed and stable  Post vital signs: Reviewed and stable  Last Vitals:  Vitals Value Taken Time  BP 117/70   Temp 97.8   Pulse 79 12/25/22 0858  Resp 18 12/25/22 0858  SpO2 100 % 12/25/22 0858  Vitals shown include unfiled device data.  Last Pain:  Vitals:   12/25/22 0656  TempSrc: Oral  PainSc: 0-No pain      Patients Stated Pain Goal: 4 (12/25/22 0656)  Complications: No notable events documented.

## 2022-12-25 NOTE — Anesthesia Procedure Notes (Signed)
Procedure Name: LMA Insertion Date/Time: 12/25/2022 7:37 AM  Performed by: Lorin Glass, CRNAPre-anesthesia Checklist: Emergency Drugs available, Patient identified, Suction available and Patient being monitored Patient Re-evaluated:Patient Re-evaluated prior to induction Oxygen Delivery Method: Circle system utilized Preoxygenation: Pre-oxygenation with 100% oxygen Induction Type: IV induction LMA: LMA inserted LMA Size: 3.0 Number of attempts: 1 Placement Confirmation: positive ETCO2 and breath sounds checked- equal and bilateral Tube secured with: Tape Dental Injury: Teeth and Oropharynx as per pre-operative assessment

## 2022-12-25 NOTE — Brief Op Note (Signed)
12/25/2022  8:56 AM  PATIENT:  Jacqueline [name]  62 y.o. female  PRE-OPERATIVE DIAGNOSIS:  Torn medical meniscus left knee  POST-OPERATIVE DIAGNOSIS:  torn lateral meniscus and partial tear acl   PROCEDURE:  Procedure(s): LEFT KNEE ARTHROSCOPY WITH PARTIAL LATERAL MEDIAL MENISCECTOMY AND DEBRIDEMENT OF ACL   SURGEON:  Surgeons and Role:    Vickki Hearing, MD - Primary  PHYSICIAN ASSISTANT:   ASSISTANTS: none   ANESTHESIA:   general  EBL:  0 mL   BLOOD ADMINISTERED:none  DRAINS: none   LOCAL MEDICATIONS USED:  MARCAINE     SPECIMEN:  No Specimen  DISPOSITION OF SPECIMEN:  N/A  COUNTS:  YES  TOURNIQUET:  * Missing tourniquet times found for documented tourniquets in log: [id number] *  DICTATION: .Dragon Dictation  PLAN OF CARE: Discharge to home after PACU  PATIENT DISPOSITION:  PACU - hemodynamically stable.   Delay start of Pharmacological VTE agent (>24hrs) due to surgical blood loss or risk of bleeding: not applicable

## 2022-12-25 NOTE — Interval H&P Note (Signed)
History and Physical Interval Note:  12/25/2022 7:24 AM  Jacqueline [name]  has presented today for surgery, with the diagnosis of Torn medical meniscus left knee.  The various methods of treatment have been discussed with the patient and family. After consideration of risks, benefits and other options for treatment, the patient has consented to  Procedure(s): KNEE ARTHROSCOPY WITH MEDIAL MENISCECTOMY (Left) as a surgical intervention.  The patient's history has been reviewed, patient examined, no change in status, stable for surgery.  I have reviewed the patient's chart and labs.  Questions were answered to the patient's satisfaction.     Fuller Canada

## 2022-12-25 NOTE — Anesthesia Preprocedure Evaluation (Signed)
Anesthesia Evaluation  Patient identified by MRN, date of birth, ID band Patient awake    Reviewed: Allergy & Precautions, H&P , NPO status , Patient's Chart, lab work & pertinent test results  History of Anesthesia Complications (+) history of anesthetic complications (hypotension)  Airway Mallampati: II  TM Distance: >3 FB Neck ROM: Full    Dental  (+) Missing, Dental Advisory Given   Pulmonary sleep apnea    Pulmonary exam normal breath sounds clear to auscultation       Cardiovascular Exercise Tolerance: Good hypertension, Pt. on medications Normal cardiovascular exam+ Valvular Problems/Murmurs  Rhythm:Regular Rate:Normal  16-Aug-2022 17:05:03 [facility] ROUTINE RECORD Nov 29, 1960 (61 yr) Female Black Vent. 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) Abnormal ECG When compared with ECG of 16-Aug-2022 15:22, QRS axis Shifted right Inverted T waves have replaced nonspecific T wave abnormality in Inferior leads Confirmed by Meridee Score [phone]) on 08/16/2022 5:26:09 PM   Neuro/Psych  Neuromuscular disease  negative psych ROS   GI/Hepatic Neg liver ROS,GERD  Medicated and Controlled,,  Endo/Other  negative endocrine ROS    Renal/GU negative Renal ROS  negative genitourinary   Musculoskeletal  (+) Arthritis , Osteoarthritis,    Abdominal   Peds negative pediatric ROS (+)  Hematology  (+) Blood dyscrasia, anemia   Anesthesia Other Findings   Reproductive/Obstetrics negative OB ROS                             Anesthesia Physical Anesthesia Plan  ASA: 3  Anesthesia Plan: General   Post-op Pain Management: Dilaudid IV   Induction: Intravenous  PONV Risk Score and Plan: 3 and Ondansetron and Dexamethasone  Airway Management  Planned: LMA  Additional Equipment:   Intra-op Plan:   Post-operative Plan: Extubation in OR  Informed Consent: I have reviewed the patients History and Physical, chart, labs and discussed the procedure including the risks, benefits and alternatives for the proposed anesthesia with the patient or authorized representative who has indicated his/her understanding and acceptance.     Dental advisory given  Plan Discussed with: CRNA and Surgeon  Anesthesia Plan Comments:         Anesthesia Quick Evaluation

## 2022-12-25 NOTE — Anesthesia Postprocedure Evaluation (Signed)
Anesthesia Post Note  Patient: Jacqueline [name]  Procedure(s) Performed: KNEE ARTHROSCOPY WITH PARTIAL LATERAL MENISCECTOMY DEBRIDEMENT ACL (Left: Knee)  Patient location during evaluation: Phase II Anesthesia Type: General Level of consciousness: awake and alert and oriented Pain management: pain level controlled Vital Signs Assessment: post-procedure vital signs reviewed and stable Respiratory status: spontaneous breathing, nonlabored ventilation and respiratory function stable Cardiovascular status: blood pressure returned to baseline and stable Postop Assessment: no apparent nausea or vomiting Anesthetic complications: no  No notable events documented.   Last Vitals:  Vitals:   12/25/22 0930 12/25/22 0951  BP: 120/71 125/78  Pulse: 81 83  Resp: 20 17  Temp:  (!) 36.4 C  SpO2: 99% 100%    Last Pain:  Vitals:   12/25/22 0951  TempSrc: Oral  PainSc: 0-No pain                 Tyheem Boughner C Artha Chiasson

## 2022-12-25 NOTE — Op Note (Signed)
12/25/2022  8:58 AM 12/25/2022  8:56 AM  PATIENT:  Jacqueline [name]  62 y.o. female  PRE-OPERATIVE DIAGNOSIS:  Torn medical meniscus left knee  POST-OPERATIVE DIAGNOSIS:  torn lateral meniscus and partial tear acl   PROCEDURE:  Procedure(s): LEFT KNEE ARTHROSCOPY WITH PARTIAL LATERAL MEDIAL MENISCECTOMY AND DEBRIDEMENT OF ACL    MEDIAL - meniscus NORMAL  -articular surface NORMAL   LATERAL - meniscus  BUCKET HANDLE TEAR IN RED WHITE ZONE TISSUE QUALITY POOR REDUCIBILITY POOR  -articular surface: GRADE 2 ON FEMUR AND TIBIA  PTF   -articular surface GRADE 2 DEPTH GRADE 3 DIAMETER CHONDROMALACIA  NOTCH  -acl PARTIAL TEAR POSTEROLATERAL BUNDLE  -pcl NORMAL   SURGEON:  Surgeons and Role:    * Vickki Hearing, MD - Primary  PHYSICIAN ASSISTANT:   ASSISTANTS: none   ANESTHESIA:   general  EBL:  0 mL   BLOOD ADMINISTERED:none  DRAINS: none   LOCAL MEDICATIONS USED:  MARCAINE     SPECIMEN:  No Specimen  DISPOSITION OF SPECIMEN:  N/A  COUNTS:  YES  TOURNIQUET:  * Missing tourniquet times found for documented tourniquets in log: [id number] *  DICTATION: .Dragon Dictation  PLAN OF CARE: Discharge to home after PACU  PATIENT DISPOSITION:  PACU - hemodynamically stable.   Delay start of Pharmacological VTE agent (>24hrs) due to surgical blood loss or risk of bleeding: not applicable     The patient was identified in the preoperative holding area using 2 approved identification mechanisms. The chart was reviewed and updated. The surgical site was confirmed as left  knee and marked with an indelible marker.  The patient was taken to the operating room for anesthesia. After successful  GENERAL  anesthesia, 2GM ANCEF  was used as IV antibiotics.  The patient was placed in the supine position with the (LEFT LOWER EXTREMITY) the operative extremity in an arthroscopic leg holder and the opposite extremity in a padded leg holder.  The timeout was executed.  A  lateral portal was established with an 11 blade and the scope was introduced into the joint. A diagnostic arthroscopy was performed in circumferential manner examining the entire knee joint. A medial portal was established and the diagnostic arthroscopy was repeated using a probe to palpate intra-articular structures as they were encountered.   Upon immediate entrance into the joint 2 structures were noted in the lateral Hemi joint.  There was a bundle of tissue with hemorrhagic ends and meniscal tissue.  This was evaluated from the medial viewing portal and lateral viewing portal.  After identifying that the posterolateral bundle stump was torn this was resected with a shaver.  Then a combination of maneuvers using a probe and grasper were used to try to reduce to the lateral meniscus.  The meniscus was not easily reducible.  Once it was partially reduced the grasper was used to try to anatomically reduced the meniscal tissue with end to end approximation.  This was not possible.  Tissue quality was poor.  Age of the patient was 76.  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  At this point it was decided to resect the lateral meniscal tissue.  This was done with a straight biter and a shaver.  An arthroscopic wand was used to handle any bleeding and contour the remaining meniscal remnants.   The arthroscopic pump was placed on the wash mode and any excess debris was removed from the joint using suction.  30 cc of Marcaine with epinephrine  was injected through the arthroscope.  The portals were closed with 3-0 nylon suture.  A sterile bandage, Ace wrap and Cryo/Cuff was placed and the Cryo/Cuff was activated. The patient was taken to the recovery room in stable condition.  POST OP PLAN  WB as tolerated SUTURES OUT IN A WEEK  AROM  BRACE playmaker brace for 6 weeks and PT for 6 weeks to emphasize quadricep strengthening and overall leg conditioning  Prognosis guarded.   Expect lateral compartment degeneration and chondrosis

## 2022-12-30 ENCOUNTER — Encounter (HOSPITAL_COMMUNITY): Payer: Self-pay | Admitting: Orthopedic Surgery

## 2023-01-02 DIAGNOSIS — Z9889 Other specified postprocedural states: Secondary | ICD-10-CM | POA: Insufficient documentation

## 2023-01-03 ENCOUNTER — Encounter: Payer: Self-pay | Admitting: Orthopedic Surgery

## 2023-01-03 ENCOUNTER — Ambulatory Visit (INDEPENDENT_AMBULATORY_CARE_PROVIDER_SITE_OTHER): Payer: 59 | Admitting: Orthopedic Surgery

## 2023-01-03 DIAGNOSIS — Z9889 Other specified postprocedural states: Secondary | ICD-10-CM

## 2023-01-03 NOTE — Patient Instructions (Signed)
Physical therapy has been ordered for you at [facility]. They should call you to schedule, [phone] is the phone number to call, if you want to call to schedule.   

## 2023-01-03 NOTE — Progress Notes (Signed)
No chief complaint on file.  DOS: 12/25/22  POST OP PLAN  WB as tolerated SUTURES OUT IN A WEEK  AROM  BRACE playmaker brace for 6 weeks and PT for 6 weeks to emphasize quadricep strengthening and overall leg conditioning   Prognosis guarded.  Expect lateral compartment degeneration and chondrosis      PRE-OPERATIVE DIAGNOSIS:  Torn medical meniscus left knee   POST-OPERATIVE DIAGNOSIS:  torn lateral meniscus and partial tear acl    PROCEDURE:  Procedure(s): LEFT KNEE ARTHROSCOPY WITH PARTIAL LATERAL MEDIAL MENISCECTOMY AND DEBRIDEMENT OF ACL

## 2023-01-05 NOTE — Therapy (Signed)
OUTPATIENT PHYSICAL THERAPY LOWER EXTREMITY EVALUATION   Patient Name: Jacqueline [name] MRN: [id number] DOB:1960/09/12, 62 y.o., female Today's Date: 01/06/2023  END OF SESSION:  PT End of Session - 01/06/23 1350     Visit Number 1    Number of Visits 12    Date for PT Re-Evaluation 02/17/23    Authorization Type [employer]    PT Start Time 1350    PT Stop Time 1430    PT Time Calculation (min) 40 min             Past Medical History:  Diagnosis Date   Anemia    Arthritis    per patient, back   Complication of anesthesia 2018   per patient HR dropped during partial hysterectomy surgery   Fibroid uterus    GERD (gastroesophageal reflux disease)    H/O total hysterectomy    Heart murmur    Hyperlipidemia    Borderline   Hypertension    Numbness and tingling in right hand    Pre-diabetes    borderline   Sleep apnea    Past Surgical History:  Procedure Laterality Date   BACK SURGERY     BALLOON DILATION N/A 05/22/2021   Procedure: BALLOON DILATION;  Surgeon: Lanelle Bal, DO;  [facility];  Service: Endoscopy;  Laterality: N/A;   BIOPSY  11/18/2022   Procedure: BIOPSY;  Surgeon: Corbin Ade, MD;  [facility]: [facility];  Service: Endoscopy;;   CHOLECYSTECTOMY     COLONOSCOPY  2003   Dr. Jena Gauss: Anal canal hemorrhoids   COLONOSCOPY N/A 08/25/2014   Normal colonoscopy.  Next colonoscopy in 2026.  Procedure: COLONOSCOPY;  Surgeon: Corbin Ade, MD;  [facility]: [facility];  Service: Endoscopy;  Laterality: N/A;  1200   ESOPHAGOGASTRODUODENOSCOPY N/A 08/25/2014   Procedure: ESOPHAGOGASTRODUODENOSCOPY (EGD);  Surgeon: Corbin Ade, MD;  [facility]: [facility];  Service: Endoscopy;  Laterality: N/A;   ESOPHAGOGASTRODUODENOSCOPY (EGD) WITH PROPOFOL N/A 05/22/2021   Web and distal esophagus, dilated, gastriti, biopsy positive for mildly active H. pylori gastritis.  s procedure: ESOPHAGOGASTRODUODENOSCOPY (EGD) WITH PROPOFOL;  Surgeon:  Lanelle Bal, DO;  [facility]: [facility];  Service: Endoscopy;  Laterality: N/A;  9:15am   ESOPHAGOGASTRODUODENOSCOPY (EGD) WITH PROPOFOL N/A 11/18/2022   Procedure: ESOPHAGOGASTRODUODENOSCOPY (EGD) WITH PROPOFOL;  Surgeon: Corbin Ade, MD;  [facility];  Service: Endoscopy;  Laterality: N/A;  2:15 pm, asa 2   KNEE ARTHROSCOPY WITH MEDIAL MENISECTOMY Left 12/25/2022   Procedure: KNEE ARTHROSCOPY WITH PARTIAL LATERAL MENISCECTOMY DEBRIDEMENT ACL;  Surgeon: Vickki Hearing, MD;  Location: [facility];  Service: Orthopedics;  Laterality: Left;   MALONEY DILATION N/A 11/18/2022   Procedure: Elease Hashimoto DILATION;  Surgeon: Corbin Ade, MD;  [facility]: [facility];  Service: Endoscopy;  Laterality: N/A;   SALPINGOOPHORECTOMY Bilateral 11/06/2016   Procedure: BILATERAL SALPINGO OOPHORECTOMY;  Surgeon: Lazaro Arms, MD;  Location: [facility];  Service: Gynecology;  Laterality: Bilateral;   SUPRACERVICAL ABDOMINAL HYSTERECTOMY N/A 11/06/2016   Procedure: HYSTERECTOMY SUPRACERVICAL ABDOMINAL;  Surgeon: Lazaro Arms, MD;  Location: [facility];  Service: Gynecology;  Laterality: N/A;   TRANSFORAMINAL LUMBAR INTERBODY FUSION (TLIF) WITH PEDICLE SCREW FIXATION 2 LEVEL Left 07/28/2019   Procedure: LEFT-SIDED LUMBAR 4- 5, LUMBAR 5 -SACRUM1 TRANSFORAMINAL LUMBAR INTERBODY FUSION WITH INSTRUMENTATION AND ALLOGRAFT;  Surgeon: Estill Bamberg, MD;  Location: [facility];  Service: Orthopedics;  Laterality: Left;   TUBAL LIGATION     Patient  Active Problem List   Diagnosis Date Noted   S/P arthroscopy of left knee debridement of ACL/ lateral meniscectomy 12/25/22 01/02/2023   Bucket-handle tear of lateral meniscus of left knee as current injury 12/25/2022   Left anterior cruciate ligament tear 12/25/2022   Acute non-recurrent maxillary sinusitis 10/26/2022   Mucopurulent conjunctivitis of both eyes 10/26/2022   Acute bronchitis 10/25/2022   Maxillary sinusitis, acute 10/25/2022   Conjunctivitis 10/25/2022    Neck pain, chronic 08/28/2022   Sleep apnea 08/28/2022   Need for immunization against influenza 02/28/2022   Screening for cervical cancer 12/21/2021   H. pylori infection 12/05/2021   Snoring 11/23/2021   Insomnia 11/23/2021   Annual physical exam 10/16/2021   Dermatitis 10/16/2021   Need for varicella vaccine 10/16/2021   Diarrhea 07/05/2021   Stomach cramps 07/05/2021   Nausea & vomiting 07/05/2021   Encounter for vaccination 07/05/2021   Right foot pain 05/16/2021   Dysphagia 05/01/2021   Right shoulder pain 12/27/2020   Rash 12/13/2020   Chronic back pain 04/04/2020   History of back surgery 04/04/2020   Radiculopathy 07/28/2019   Borderline diabetes 12/25/2018   Hypertension 06/30/2018   S/P abdominal supracervical subtotal hysterectomy 11/06/2016   Trigger middle finger of right hand 04/24/2016   GERD (gastroesophageal reflux disease) 08/04/2014   Shoulder strain 07/26/2014   Degenerative arthritis of thumb 07/26/2014   Left knee pain 02/28/2014   Hip pain 04/15/2013   Epicondylitis, lateral (tennis elbow) 04/13/2013   Constipation 09/15/2012   Hyperlipidemia 11/28/2011   Obesity, Class I, BMI 30.0-34.9 (see actual BMI) 09/15/2011   ALLERGIC RHINITIS, SEASONAL 11/13/2007    PCP: Gilmore Laroche  REFERRING PROVIDER: Vickki Hearing, MD  REFERRING DIAG: Diagnosis [phone] (ICD-10-CM) - S/P arthroscopy of left knee  THERAPY DIAG:  Left knee pain Muscle weakness Difficulty in walking  Rationale for Evaluation and Treatment: Rehabilitation  ONSET DATE: 12/25/22    SUBJECTIVE STATEMENT: Ms. Baxley states that she had arthroscopic surgery on her knee on 12/25/22.  Pt states that she has been bending and trying to lift her leg up but it is sore.   PT is unable to states how long she can stand, walk or sit for states she just doesn't know.    PERTINENT HISTORY: No chief complaint on file.   DOS: 12/25/22   POST OP PLAN  WB as tolerated SUTURES OUT IN A  WEEK  AROM  BRACE playmaker brace for 6 weeks and PT for 6 weeks to emphasize quadricep strengthening and overall leg conditioning   Prognosis guarded.  Expect lateral compartment degeneration and chondrosis        PRE-OPERATIVE DIAGNOSIS:  Torn medical meniscus left knee   POST-OPERATIVE DIAGNOSIS:  torn lateral meniscus and partial tear acl    PROCEDURE:  Procedure(s): LEFT KNEE ARTHROSCOPY WITH PARTIAL LATERAL MEDIAL MENISCECTOMY AND DEBRIDEMENT OF ACL        PAIN:  Are you having pain? Yes: NPRS scale: 8/10 Pain location: left knee Pain description: sore Aggravating factors: WB Relieving factors: elevating and putting ice on it.    PRECAUTIONS: BRACE playmaker brace for 6 weeks and PT for 6 weeks to emphasize quadricep strengthening and overall leg conditioning       WEIGHT BEARING RESTRICTIONS:  WBAT FALLS:  Has patient fallen in last 6 months? No  LIVING ENVIRONMENT: Lives with: lives with their spouse Lives in: Mobile home Stairs: Yes: External: 4 steps; on right going up Has following equipment at home: Single point cane  and Walker - 4 wheeled  OCCUPATION: Conservation officer, [employer]  PLOF: Independent  PATIENT GOALS: walk better  NEXT MD VISIT: after therapy  OBJECTIVE:   COGNITION: Overall cognitive status: Within functional limits for tasks assessed      EDEMA:  Normal for post op   MUSCLE LENGTH: Hamstrings: Right 140 deg; Left 135 deg  PALPATION: tender  LOWER EXTREMITY ROM:  Active ROM Right eval Left eval  Hip flexion    Hip extension    Hip abduction    Hip adduction    Hip internal rotation    Hip external rotation    Knee flexion  105  Knee extension  3  Ankle dorsiflexion    Ankle plantarflexion    Ankle inversion    Ankle eversion     (Blank rows = not tested)  LOWER EXTREMITY MMT:  MMT Right eval Left eval  Hip flexion  2  Hip extension    Hip abduction  3-  Hip adduction    Hip internal rotation    Hip external rotation     Knee flexion  3+  Knee extension  3  Ankle dorsiflexion    Ankle plantarflexion    Ankle inversion    Ankle eversion     (Blank rows = not tested)    FUNCTIONAL TESTS: (with knee brace) 2 minute walk test: 322 with knee brace and rollaltor Single leg stance:  21" on RT; 3" on LT  Sit to stand:  5x  with UE assist: 44 seconds     TODAY'S TREATMENT:                                                                                                                              DATE: Evaluation 01/06/23 Sitting:   LAQ x 10 Sit to stand x 5 Supine: Heel slide x 10 Quad set x 10  SLR x 5   Rt side lying :  ABduction x 5  Prone: Knee flexion x 10    PATIENT EDUCATION:  Education details: HEp Person educated: Patient Education method: Chief Technology Officer Education comprehension: verbalized understanding and returned demonstration  HOME EXERCISE PROGRAM: Access Code: [email or web address] URL: [email or web address] Date: 01/06/2023 Prepared by: Virgina Organ  Exercises - Seated Long Arc Quad  - 3 x daily - 7 x weekly - 1 sets - 10 reps - 5" hold - Supine Quadricep Sets  - 3 x daily - 7 x weekly - 1 sets - 10 reps - 5" hold - Active Straight Leg Raise with Quad Set  - 2 x daily - 7 x weekly - 1 sets - 10 reps - 5" hold - Supine Heel Slide  - 2 x daily - 7 x weekly - 1 sets - 10 reps - 5" hold - Sit to Stand with Counter Support  - 3 x daily - 7 x weekly - 1 sets - 10 reps  ASSESSMENT:  CLINICAL IMPRESSION: Patient is a 61  y.o. female  who was seen today for physical therapy evaluation and treatment for s/p Lt arthroscopic surgery.  Evaluation demonstrates decreased ROM, decreased strength, decreased balance, decreased activity tolerance and increased pain. Ms. Maren Reamer will benefit from skilled PT to address these deficits and maximize her functional ability  OBJECTIVE IMPAIRMENTS: Abnormal gait, decreased activity tolerance, decreased balance, decreased mobility,  difficulty walking, decreased ROM, increased edema, and pain.   ACTIVITY LIMITATIONS: carrying, lifting, bending, sitting, standing, stairs, and locomotion level  PARTICIPATION LIMITATIONS: cleaning, shopping, community activity, and occupation  PERSONAL FACTORS: Fitness are also affecting patient's functional outcome.   REHAB POTENTIAL: Good  CLINICAL DECISION MAKING: Stable/uncomplicated  EVALUATION COMPLEXITY: Low   GOALS: Goals reviewed with patient? No  SHORT TERM GOALS: Target date: 01/27/23 PT to be I in HEP in order to decrease her pain to no greater than a 4/10 Baseline: Goal status: INITIAL  2.  PT core and LE strength increased to allow pt to be ambulating with a cane  Baseline:  Goal status: INITIAL  3.  Pt ROM increased to 120 to be able to squat down.  Baseline:  Goal status: INITIAL  LONG TERM GOALS: Target date: 02/17/2023  PT to be I in an advanced  HEP in order to decrease her pain to no greater than a 1/10 Baseline:  Goal status: INITIAL  2.  PT core and LE strength increased to allow pt to be ambulating without an assistive device  Baseline:  Goal status: INITIAL  3.  PT to be able to go up and down 4 steps in a reciprocal manner  Baseline:  Goal status: INITIAL  4.  PT to be able to be on her feet standing/walking for 2 hours at a time to be able to return to work  Baseline:  Goal status: INITIAL     PLAN:  PT FREQUENCY: 2x/week  PT DURATION: 6 weeks  PLANNED INTERVENTIONS: Therapeutic exercises, Therapeutic activity, Gait training, Patient/Family education, Self Care, Joint mobilization, and Manual therapy  PLAN FOR NEXT SESSION: begin WB strengthening as able   Virgina Organ, PT CLT [phone]  01/06/2023, 2:32 PM

## 2023-01-06 ENCOUNTER — Encounter: Payer: Self-pay | Admitting: Orthopedic Surgery

## 2023-01-06 ENCOUNTER — Ambulatory Visit (INDEPENDENT_AMBULATORY_CARE_PROVIDER_SITE_OTHER): Payer: 59 | Admitting: Orthopedic Surgery

## 2023-01-06 ENCOUNTER — Ambulatory Visit (HOSPITAL_COMMUNITY): Payer: 59 | Attending: Internal Medicine | Admitting: Physical Therapy

## 2023-01-06 ENCOUNTER — Other Ambulatory Visit: Payer: Self-pay

## 2023-01-06 DIAGNOSIS — M6281 Muscle weakness (generalized): Secondary | ICD-10-CM | POA: Insufficient documentation

## 2023-01-06 DIAGNOSIS — S83241D Other tear of medial meniscus, current injury, right knee, subsequent encounter: Secondary | ICD-10-CM | POA: Diagnosis not present

## 2023-01-06 DIAGNOSIS — Z9889 Other specified postprocedural states: Secondary | ICD-10-CM | POA: Insufficient documentation

## 2023-01-06 DIAGNOSIS — M25562 Pain in left knee: Secondary | ICD-10-CM | POA: Insufficient documentation

## 2023-01-06 DIAGNOSIS — M25662 Stiffness of left knee, not elsewhere classified: Secondary | ICD-10-CM | POA: Diagnosis not present

## 2023-01-06 NOTE — Patient Instructions (Addendum)
Call [phone] / [employer] customer service, they bill for our braces. Let them know you returned the brace it did not work for you.  The item number is [id number] [facility] number is [id number]  Large playmaker brace

## 2023-01-06 NOTE — Progress Notes (Signed)
Chief Complaint  Patient presents with   Post-op Follow-up    Brace wont stay up s/p surgery 12/25/22   The patient had a playmaker brace and it would not stay out we tried several playmaker sizes they would not fit her leg so then we tried an economy hinged brace and will let her wear that to see if that works if not then we will have to do a custom brace

## 2023-01-06 NOTE — Progress Notes (Signed)
CUSTOM KNEE BRACE patient has Instability with functional testing, Quad to calf ratio mismatch prevents off the shelf bracing

## 2023-01-07 ENCOUNTER — Ambulatory Visit (HOSPITAL_COMMUNITY): Payer: 59 | Admitting: Physical Therapy

## 2023-01-07 DIAGNOSIS — M25562 Pain in left knee: Secondary | ICD-10-CM

## 2023-01-07 DIAGNOSIS — M25662 Stiffness of left knee, not elsewhere classified: Secondary | ICD-10-CM | POA: Diagnosis not present

## 2023-01-07 DIAGNOSIS — M6281 Muscle weakness (generalized): Secondary | ICD-10-CM

## 2023-01-07 DIAGNOSIS — Z9889 Other specified postprocedural states: Secondary | ICD-10-CM | POA: Diagnosis not present

## 2023-01-07 NOTE — Therapy (Signed)
OUTPATIENT PHYSICAL THERAPY TREATMENT  Patient Name: Jacqueline [name] MRN: [id number] DOB:April 14, 1961, 62 y.o., female Today's Date: 01/07/2023  END OF SESSION:  PT End of Session - 01/07/23 1559     Visit Number 2    Number of Visits 12    Date for PT Re-Evaluation 02/17/23    Authorization Type [employer]    PT Start Time 1435    PT Stop Time 1515    PT Time Calculation (min) 40 min              Past Medical History:  Diagnosis Date   Anemia    Arthritis    per patient, back   Complication of anesthesia 2018   per patient HR dropped during partial hysterectomy surgery   Fibroid uterus    GERD (gastroesophageal reflux disease)    H/O total hysterectomy    Heart murmur    Hyperlipidemia    Borderline   Hypertension    Numbness and tingling in right hand    Pre-diabetes    borderline   Sleep apnea    Past Surgical History:  Procedure Laterality Date   BACK SURGERY     BALLOON DILATION N/A 05/22/2021   Procedure: BALLOON DILATION;  Surgeon: Jacqueline Bal, DO;  [facility];  Service: Endoscopy;  Laterality: N/A;   BIOPSY  11/18/2022   Procedure: BIOPSY;  Surgeon: Jacqueline Ade, MD;  [facility]: [facility];  Service: Endoscopy;;   CHOLECYSTECTOMY     COLONOSCOPY  2003   Dr. Jena [name]: Anal canal hemorrhoids   COLONOSCOPY N/A 08/25/2014   Normal colonoscopy.  Next colonoscopy in 2026.  Procedure: COLONOSCOPY;  Surgeon: Jacqueline Ade, MD;  [facility]: [facility];  Service: Endoscopy;  Laterality: N/A;  1200   ESOPHAGOGASTRODUODENOSCOPY N/A 08/25/2014   Procedure: ESOPHAGOGASTRODUODENOSCOPY (EGD);  Surgeon: Jacqueline Ade, MD;  [facility];  Service: Endoscopy;  Laterality: N/A;   ESOPHAGOGASTRODUODENOSCOPY (EGD) WITH PROPOFOL N/A 05/22/2021   Web and distal esophagus, dilated, gastriti, biopsy positive for mildly active H. pylori gastritis.  s procedure: ESOPHAGOGASTRODUODENOSCOPY (EGD) WITH PROPOFOL;  Surgeon: Jacqueline Bal, DO;   [facility]: [facility];  Service: Endoscopy;  Laterality: N/A;  9:15am   ESOPHAGOGASTRODUODENOSCOPY (EGD) WITH PROPOFOL N/A 11/18/2022   Procedure: ESOPHAGOGASTRODUODENOSCOPY (EGD) WITH PROPOFOL;  Surgeon: Jacqueline Ade, MD;  [facility];  Service: Endoscopy;  Laterality: N/A;  2:15 pm, asa 2   KNEE ARTHROSCOPY WITH MEDIAL MENISECTOMY Left 12/25/2022   Procedure: KNEE ARTHROSCOPY WITH PARTIAL LATERAL MENISCECTOMY DEBRIDEMENT ACL;  Surgeon: Jacqueline Hearing, MD;  Location: [facility];  Service: Orthopedics;  Laterality: Left;   MALONEY DILATION N/A 11/18/2022   Procedure: Jacqueline [name] DILATION;  Surgeon: Jacqueline Ade, MD;  [facility]: [facility];  Service: Endoscopy;  Laterality: N/A;   SALPINGOOPHORECTOMY Bilateral 11/06/2016   Procedure: BILATERAL SALPINGO OOPHORECTOMY;  Surgeon: Jacqueline Arms, MD;  Location: [facility];  Service: Gynecology;  Laterality: Bilateral;   SUPRACERVICAL ABDOMINAL HYSTERECTOMY N/A 11/06/2016   Procedure: HYSTERECTOMY SUPRACERVICAL ABDOMINAL;  Surgeon: Jacqueline Arms, MD;  Location: [facility];  Service: Gynecology;  Laterality: N/A;   TRANSFORAMINAL LUMBAR INTERBODY FUSION (TLIF) WITH PEDICLE SCREW FIXATION 2 LEVEL Left 07/28/2019   Procedure: LEFT-SIDED LUMBAR 4- 5, LUMBAR 5 -SACRUM1 TRANSFORAMINAL LUMBAR INTERBODY FUSION WITH INSTRUMENTATION AND ALLOGRAFT;  Surgeon: Jacqueline Bamberg, MD;  Location: [facility];  Service: Orthopedics;  Laterality: Left;   TUBAL LIGATION     Patient Active Problem  List   Diagnosis Date Noted   S/P arthroscopy of left knee debridement of ACL/ lateral meniscectomy 12/25/22 01/02/2023   Bucket-handle tear of lateral meniscus of left knee as current injury 12/25/2022   Left anterior cruciate ligament tear 12/25/2022   Acute non-recurrent maxillary sinusitis 10/26/2022   Mucopurulent conjunctivitis of both eyes 10/26/2022   Acute bronchitis 10/25/2022   Maxillary sinusitis, acute 10/25/2022   Conjunctivitis 10/25/2022   Neck pain, chronic  08/28/2022   Sleep apnea 08/28/2022   Need for immunization against influenza 02/28/2022   Screening for cervical cancer 12/21/2021   H. pylori infection 12/05/2021   Snoring 11/23/2021   Insomnia 11/23/2021   Annual physical exam 10/16/2021   Dermatitis 10/16/2021   Need for varicella vaccine 10/16/2021   Diarrhea 07/05/2021   Stomach cramps 07/05/2021   Nausea & vomiting 07/05/2021   Encounter for vaccination 07/05/2021   Right foot pain 05/16/2021   Dysphagia 05/01/2021   Right shoulder pain 12/27/2020   Rash 12/13/2020   Chronic back pain 04/04/2020   History of back surgery 04/04/2020   Radiculopathy 07/28/2019   Borderline diabetes 12/25/2018   Hypertension 06/30/2018   S/P abdominal supracervical subtotal hysterectomy 11/06/2016   Trigger middle finger of right hand 04/24/2016   GERD (gastroesophageal reflux disease) 08/04/2014   Shoulder strain 07/26/2014   Degenerative arthritis of thumb 07/26/2014   Left knee pain 02/28/2014   Hip pain 04/15/2013   Epicondylitis, lateral (tennis elbow) 04/13/2013   Constipation 09/15/2012   Hyperlipidemia 11/28/2011   Obesity, Class I, BMI 30.0-34.9 (see actual BMI) 09/15/2011   ALLERGIC RHINITIS, SEASONAL 11/13/2007    PCP: Jacqueline [name]  REFERRING PROVIDER: Vickki Hearing, MD  REFERRING DIAG: Diagnosis [phone] (ICD-10-CM) - S/P arthroscopy of left knee  THERAPY DIAG:  Left knee pain Muscle weakness Difficulty in walking  Rationale for Evaluation and Treatment: Rehabilitation  ONSET DATE: 12/25/22    SUBJECTIVE STATEMENT: PT reports MD appt went well.  Currently 6/10 in LT knee, mostly tenderness.  States swelling is better into her ankle but her knee is still swollen.  Wearing brace and using rolling walker.   Evaluation: [name] states that she had arthroscopic surgery on her knee on 12/25/22.  Pt states that she has been bending and trying to lift her leg up but it is sore.   PT is unable to states how  long she can stand, walk or sit for states she just doesn't know.    PERTINENT HISTORY: No chief complaint on file.   DOS: 12/25/22   POST OP PLAN  WB as tolerated SUTURES OUT IN A WEEK  AROM  BRACE playmaker brace for 6 weeks and PT for 6 weeks to emphasize quadricep strengthening and overall leg conditioning   Prognosis guarded.  Expect lateral compartment degeneration and chondrosis        PRE-OPERATIVE DIAGNOSIS:  Torn medical meniscus left knee   POST-OPERATIVE DIAGNOSIS:  torn lateral meniscus and partial tear acl    PROCEDURE:  Procedure(s): LEFT KNEE ARTHROSCOPY WITH PARTIAL LATERAL MEDIAL MENISCECTOMY AND DEBRIDEMENT OF ACL        PAIN:  Are you having pain? Yes: NPRS scale: 8/10 Pain location: left knee Pain description: sore Aggravating factors: WB Relieving factors: elevating and putting ice on it.    PRECAUTIONS: BRACE playmaker brace for 6 weeks and PT for 6 weeks to emphasize quadricep strengthening and overall leg conditioning     WEIGHT BEARING RESTRICTIONS:  WBAT FALLS:  Has patient fallen in  last 6 months? No  LIVING ENVIRONMENT: Lives with: lives with their spouse Lives in: Mobile home Stairs: Yes: External: 4 steps; on right going up Has following equipment at home: Single point cane and Walker - 4 wheeled  OCCUPATION: Conservation officer, [employer]  PLOF: Independent  PATIENT GOALS: walk better  NEXT MD VISIT: after therapy  OBJECTIVE:   COGNITION: Overall cognitive status: Within functional limits for tasks assessed      EDEMA:  Normal for post op   MUSCLE LENGTH: Hamstrings: Right 140 deg; Left 135 deg  PALPATION: tender  LOWER EXTREMITY ROM:  Active ROM Right eval Left eval  Hip flexion    Hip extension    Hip abduction    Hip adduction    Hip internal rotation    Hip external rotation    Knee flexion  105  Knee extension  3  Ankle dorsiflexion    Ankle plantarflexion    Ankle inversion    Ankle eversion     (Blank rows = not  tested)  LOWER EXTREMITY MMT:  MMT Right eval Left eval  Hip flexion  2  Hip extension    Hip abduction  3-  Hip adduction    Hip internal rotation    Hip external rotation    Knee flexion  3+  Knee extension  3  Ankle dorsiflexion    Ankle plantarflexion    Ankle inversion    Ankle eversion     (Blank rows = not tested)    FUNCTIONAL TESTS: (with knee brace) 2 minute walk test: 322 with knee brace and rollaltor Single leg stance:  21" on RT; 3" on LT  Sit to stand:  5x  with UE assist: 44 seconds     TODAY'S TREATMENT:                                                                                                                              DATE:  01/07/23 Standing:  heel raises 10X  Toe raises 10X  Lt knee flexion 10X Ambulation 200 feet with SPC Sitting:  LAQ 10X5"  Sit to stand 10X no UE Supine:  heelslides 10X  Quad sets 10X5"  SLR unable AROM 110 degrees flexion  Evaluation 01/06/23 Sitting:   LAQ x 10 Sit to stand x 5 Supine: Heel slide x 10 Quad set x 10  SLR x 5   Rt side lying :  ABduction x 5  Prone: Knee flexion x 10    PATIENT EDUCATION:  Education details: HEp Person educated: Patient Education method: Chief Technology Officer Education comprehension: verbalized understanding and returned demonstration  HOME EXERCISE PROGRAM: Access Code: [email or web address] URL: [email or web address] Date: 01/06/2023 Prepared by: Virgina Organ  Exercises - Seated Long Arc Quad  - 3 x daily - 7 x weekly - 1 sets - 10 reps - 5" hold - Supine Quadricep Sets  - 3 x daily - 7 x weekly - 1 sets - 10 reps -  5" hold - Active Straight Leg Raise with Quad Set  - 2 x daily - 7 x weekly - 1 sets - 10 reps - 5" hold - Supine Heel Slide  - 2 x daily - 7 x weekly - 1 sets - 10 reps - 5" hold - Sit to Stand with Counter Support  - 3 x daily - 7 x weekly - 1 sets - 10 reps  ASSESSMENT:  CLINICAL IMPRESSION: Reviewed goals and POC moving forward.  Pt  removed brace for therapy session today.  Began standing exercises with good form and no pain. Attempted SLR on Lt, however quad is too weak and is uncomfortable on hip flexor.  Pt displays good gait quality using SPC and no antalgia/pain reported.  PT given information on TENS unit on [employer] per request that is needed for her back.  Explained we do not provide those here.  Pt will continue to benefit from skilled therapy to progress towards goals and increase functional independence.    Evaluation:  Patient is a 62 y.o. female  who was seen today for physical therapy evaluation and treatment for s/p Lt arthroscopic surgery.  Evaluation demonstrates decreased ROM, decreased strength, decreased balance, decreased activity tolerance and increased pain. Ms. Maren Reamer will benefit from skilled PT to address these deficits and maximize her functional ability  OBJECTIVE IMPAIRMENTS: Abnormal gait, decreased activity tolerance, decreased balance, decreased mobility, difficulty walking, decreased ROM, increased edema, and pain.   ACTIVITY LIMITATIONS: carrying, lifting, bending, sitting, standing, stairs, and locomotion level  PARTICIPATION LIMITATIONS: cleaning, shopping, community activity, and occupation  PERSONAL FACTORS: Fitness are also affecting patient's functional outcome.   REHAB POTENTIAL: Good  CLINICAL DECISION MAKING: Stable/uncomplicated  EVALUATION COMPLEXITY: Low   GOALS: Goals reviewed with patient? Yes  SHORT TERM GOALS: Target date: 01/27/23 PT to be I in HEP in order to decrease her pain to no greater than a 4/10 Baseline: Goal status: IN PROGRESS  2.  PT core and LE strength increased to allow pt to be ambulating with a cane  Baseline:  Goal status: IN PROGRESS  3.  Pt ROM increased to 120 to be able to squat down.  Baseline:  Goal status: IN PROGRESS   LONG TERM GOALS: Target date: 02/17/2023  PT to be I in an advanced  HEP in order to decrease her pain to no greater  than a 1/10 Baseline:  Goal status: IN PROGRESS   2.  PT core and LE strength increased to allow pt to be ambulating without an assistive device  Baseline:  Goal status: IN PROGRESS   3.  PT to be able to go up and down 4 steps in a reciprocal manner  Baseline:  Goal status: IN PROGRESS   4.  PT to be able to be on her feet standing/walking for 2 hours at a time to be able to return to work  Baseline:  Goal status: IN PROGRESS   PLAN:  PT FREQUENCY: 2x/week  PT DURATION: 6 weeks  PLANNED INTERVENTIONS: Therapeutic exercises, Therapeutic activity, Gait training, Patient/Family education, Self Care, Joint mobilization, and Manual therapy  PLAN FOR NEXT SESSION: Progress WB strengthening as able, gait with LRAD.     Lurena Nida, PTA/CLT [facility] Ph: [phone]  01/07/2023, 4:01 PM

## 2023-01-09 DIAGNOSIS — Z419 Encounter for procedure for purposes other than remedying health state, unspecified: Secondary | ICD-10-CM | POA: Diagnosis not present

## 2023-01-10 ENCOUNTER — Encounter (HOSPITAL_COMMUNITY): Payer: 59

## 2023-01-15 ENCOUNTER — Ambulatory Visit (HOSPITAL_COMMUNITY): Payer: 59 | Attending: Internal Medicine | Admitting: Physical Therapy

## 2023-01-15 DIAGNOSIS — M25562 Pain in left knee: Secondary | ICD-10-CM | POA: Insufficient documentation

## 2023-01-15 DIAGNOSIS — M6281 Muscle weakness (generalized): Secondary | ICD-10-CM | POA: Insufficient documentation

## 2023-01-15 DIAGNOSIS — M25662 Stiffness of left knee, not elsewhere classified: Secondary | ICD-10-CM | POA: Diagnosis not present

## 2023-01-15 NOTE — Therapy (Signed)
OUTPATIENT PHYSICAL THERAPY TREATMENT  Patient Name: Jacqueline [name] MRN: [id number] DOB:Oct 15, 1960, 62 y.o., female Today's Date: 01/15/2023  END OF SESSION:  PT End of Session - 01/15/23 0900    Visit Number 3    Number of Visits 12    Date for PT Re-Evaluation 02/17/23    Authorization Type [employer]    PT Start Time [phone]    PT Stop Time 0900    PT Time Calculation (min) 39 min              Past Medical History:  Diagnosis Date   Anemia    Arthritis    per patient, back   Complication of anesthesia 2018   per patient HR dropped during partial hysterectomy surgery   Fibroid uterus    GERD (gastroesophageal reflux disease)    H/O total hysterectomy    Heart murmur    Hyperlipidemia    Borderline   Hypertension    Numbness and tingling in right hand    Pre-diabetes    borderline   Sleep apnea    Past Surgical History:  Procedure Laterality Date   BACK SURGERY     BALLOON DILATION N/A 05/22/2021   Procedure: BALLOON DILATION;  Surgeon: Lanelle Bal, DO;  [facility]: [facility];  Service: Endoscopy;  Laterality: N/A;   BIOPSY  11/18/2022   Procedure: BIOPSY;  Surgeon: Corbin Ade, MD;  Location: [facility];  Service: Endoscopy;;   CHOLECYSTECTOMY     COLONOSCOPY  2003   Dr. Jena Gauss: Anal canal hemorrhoids   COLONOSCOPY N/A 08/25/2014   Normal colonoscopy.  Next colonoscopy in 2026.  Procedure: COLONOSCOPY;  Surgeon: Corbin Ade, MD;  [facility]: [facility];  Service: Endoscopy;  Laterality: N/A;  1200   ESOPHAGOGASTRODUODENOSCOPY N/A 08/25/2014   Procedure: ESOPHAGOGASTRODUODENOSCOPY (EGD);  Surgeon: Corbin Ade, MD;  [facility]: [facility];  Service: Endoscopy;  Laterality: N/A;   ESOPHAGOGASTRODUODENOSCOPY (EGD) WITH PROPOFOL N/A 05/22/2021   Web and distal esophagus, dilated, gastriti, biopsy positive for mildly active H. pylori gastritis.  s procedure: ESOPHAGOGASTRODUODENOSCOPY (EGD) WITH PROPOFOL;  Surgeon: Lanelle Bal, DO;   [facility]: [facility];  Service: Endoscopy;  Laterality: N/A;  9:15am   ESOPHAGOGASTRODUODENOSCOPY (EGD) WITH PROPOFOL N/A 11/18/2022   Procedure: ESOPHAGOGASTRODUODENOSCOPY (EGD) WITH PROPOFOL;  Surgeon: Corbin Ade, MD;  [facility]: [facility];  Service: Endoscopy;  Laterality: N/A;  2:15 pm, asa 2   KNEE ARTHROSCOPY WITH MEDIAL MENISECTOMY Left 12/25/2022   Procedure: KNEE ARTHROSCOPY WITH PARTIAL LATERAL MENISCECTOMY DEBRIDEMENT ACL;  Surgeon: Vickki Hearing, MD;  Location: [facility];  Service: Orthopedics;  Laterality: Left;   MALONEY DILATION N/A 11/18/2022   Procedure: Elease Hashimoto DILATION;  Surgeon: Corbin Ade, MD;  [facility]: [facility];  Service: Endoscopy;  Laterality: N/A;   SALPINGOOPHORECTOMY Bilateral 11/06/2016   Procedure: BILATERAL SALPINGO OOPHORECTOMY;  Surgeon: Lazaro Arms, MD;  Location: [facility];  Service: Gynecology;  Laterality: Bilateral;   SUPRACERVICAL ABDOMINAL HYSTERECTOMY N/A 11/06/2016   Procedure: HYSTERECTOMY SUPRACERVICAL ABDOMINAL;  Surgeon: Lazaro Arms, MD;  Location: [facility];  Service: Gynecology;  Laterality: N/A;   TRANSFORAMINAL LUMBAR INTERBODY FUSION (TLIF) WITH PEDICLE SCREW FIXATION 2 LEVEL Left 07/28/2019   Procedure: LEFT-SIDED LUMBAR 4- 5, LUMBAR 5 -SACRUM1 TRANSFORAMINAL LUMBAR INTERBODY FUSION WITH INSTRUMENTATION AND ALLOGRAFT;  Surgeon: Estill Bamberg, MD;  Location: [facility];  Service: Orthopedics;  Laterality: Left;   TUBAL LIGATION     Patient Active Problem List  Diagnosis Date Noted   S/P arthroscopy of left knee debridement of ACL/ lateral meniscectomy 12/25/22 01/02/2023   Bucket-handle tear of lateral meniscus of left knee as current injury 12/25/2022   Left anterior cruciate ligament tear 12/25/2022   Acute non-recurrent maxillary sinusitis 10/26/2022   Mucopurulent conjunctivitis of both eyes 10/26/2022   Acute bronchitis 10/25/2022   Maxillary sinusitis, acute 10/25/2022   Conjunctivitis 10/25/2022   Neck pain, chronic  08/28/2022   Sleep apnea 08/28/2022   Need for immunization against influenza 02/28/2022   Screening for cervical cancer 12/21/2021   H. pylori infection 12/05/2021   Snoring 11/23/2021   Insomnia 11/23/2021   Annual physical exam 10/16/2021   Dermatitis 10/16/2021   Need for varicella vaccine 10/16/2021   Diarrhea 07/05/2021   Stomach cramps 07/05/2021   Nausea & vomiting 07/05/2021   Encounter for vaccination 07/05/2021   Right foot pain 05/16/2021   Dysphagia 05/01/2021   Right shoulder pain 12/27/2020   Rash 12/13/2020   Chronic back pain 04/04/2020   History of back surgery 04/04/2020   Radiculopathy 07/28/2019   Borderline diabetes 12/25/2018   Hypertension 06/30/2018   S/P abdominal supracervical subtotal hysterectomy 11/06/2016   Trigger middle finger of right hand 04/24/2016   GERD (gastroesophageal reflux disease) 08/04/2014   Shoulder strain 07/26/2014   Degenerative arthritis of thumb 07/26/2014   Left knee pain 02/28/2014   Hip pain 04/15/2013   Epicondylitis, lateral (tennis elbow) 04/13/2013   Constipation 09/15/2012   Hyperlipidemia 11/28/2011   Obesity, Class I, BMI 30.0-34.9 (see actual BMI) 09/15/2011   ALLERGIC RHINITIS, SEASONAL 11/13/2007    PCP: Gilmore Laroche  REFERRING PROVIDER: Vickki Hearing, MD  REFERRING DIAG: Diagnosis [id number] (ICD-10-CM) - S/P arthroscopy of left knee  THERAPY DIAG:  Left knee pain Muscle weakness Difficulty in walking  Rationale for Evaluation and Treatment: Rehabilitation  ONSET DATE: 12/25/22    SUBJECTIVE STATEMENT:Pt comes to the department using a cane.  States that she still is unable to complete a SLR  Evaluation: Ms. Walden states that she had arthroscopic surgery on her knee on 12/25/22.  Pt states that she has been bending and trying to lift her leg up but it is sore.   PT is unable to states how long she can stand, walk or sit for states she just doesn't know.    PERTINENT HISTORY: No chief  complaint on file.   DOS: 12/25/22   POST OP PLAN  WB as tolerated SUTURES OUT IN A WEEK  AROM  BRACE playmaker brace for 6 weeks and PT for 6 weeks to emphasize quadricep strengthening and overall leg conditioning   Prognosis guarded.  Expect lateral compartment degeneration and chondrosis        PRE-OPERATIVE DIAGNOSIS:  Torn medical meniscus left knee   POST-OPERATIVE DIAGNOSIS:  torn lateral meniscus and partial tear acl    PROCEDURE:  Procedure(s): LEFT KNEE ARTHROSCOPY WITH PARTIAL LATERAL MEDIAL MENISCECTOMY AND DEBRIDEMENT OF ACL        PAIN:  Are you having pain? Yes: NPRS scale: 3/10 Pain location: left knee Pain description: sore Aggravating factors: WB Relieving factors: elevating and putting ice on it.    PRECAUTIONS: BRACE playmaker brace for 6 weeks and PT for 6 weeks to emphasize quadricep strengthening and overall leg conditioning     WEIGHT BEARING RESTRICTIONS:  WBAT FALLS:  Has patient fallen in last 6 months? No  LIVING ENVIRONMENT: Lives with: lives with their spouse Lives in: Mobile home Stairs: Yes: External: 4  steps; on right going up Has following equipment at home: Single point cane and Walker - 4 wheeled  OCCUPATION: Conservation officer, [employer]  PLOF: Independent  PATIENT GOALS: walk better  NEXT MD VISIT: after therapy  OBJECTIVE:   COGNITION: Overall cognitive status: Within functional limits for tasks assessed      EDEMA:  Normal for post op   MUSCLE LENGTH: Hamstrings: Right 140 deg; Left 135 deg  PALPATION: tender  LOWER EXTREMITY ROM:  Active ROM Right eval Left eval left  Hip flexion     Hip extension     Hip abduction     Hip adduction     Hip internal rotation     Hip external rotation     Knee flexion  105 115  Knee extension  3 2  Ankle dorsiflexion     Ankle plantarflexion     Ankle inversion     Ankle eversion      (Blank rows = not tested)  LOWER EXTREMITY MMT:  MMT Right eval Left eval  Hip flexion  2   Hip extension    Hip abduction  3-  Hip adduction    Hip internal rotation    Hip external rotation    Knee flexion  3+  Knee extension  3  Ankle dorsiflexion    Ankle plantarflexion    Ankle inversion    Ankle eversion     (Blank rows = not tested)    FUNCTIONAL TESTS: (with knee brace) 2 minute walk test: 322 with knee brace and rollaltor Single leg stance:  21" on RT; 3" on LT  Sit to stand:  5x  with UE assist: 44 seconds     TODAY'S TREATMENT:                                                                                                                              DATE:  01/15/23 Heel raises x 15 Terminal extension x 10 Lunging onto 12" box x 5 Squat x 10 Lt knee flexion 3# x 10 Sitting : LAQ 3# x 15 Sit to stand x 10 no UE assist  Supine: SAQ 3# x 10 Quad set x 10 Heel slide x 10  SLR:  5  01/07/23 Standing:  heel raises 10X  Toe raises 10X  Lt knee flexion 10X Ambulation 200 feet with SPC Sitting:  LAQ 10X5"  Sit to stand 10X no UE Supine:  heelslides 10X  Quad sets 10X5"  SLR unable AROM 110 degrees flexion  Evaluation 01/06/23 Sitting:   LAQ x 10 Sit to stand x 5 Supine: Heel slide x 10 Quad set x 10  SLR x 5   Rt side lying :  ABduction x 5  Prone: Knee flexion x 10    PATIENT EDUCATION:  Education details: HEp Person educated: Patient Education method: Chief Technology Officer Education comprehension: verbalized understanding and returned demonstration  HOME EXERCISE PROGRAM: Access Code: [email or web address] URL: [email or web address] Date:  01/06/2023 Prepared by: Virgina Organ  Exercises - Seated Long Arc Quad  - 3 x daily - 7 x weekly - 1 sets - 10 reps - 5" hold - Supine Quadricep Sets  - 3 x daily - 7 x weekly - 1 sets - 10 reps - 5" hold - Active Straight Leg Raise with Quad Set  - 2 x daily - 7 x weekly - 1 sets - 10 reps - 5" hold - Supine Heel Slide  - 2 x daily - 7 x weekly - 1 sets - 10 reps - 5" hold - Sit to  Stand with Counter Support  - 3 x daily - 7 x weekly - 1 sets - 10 reps  ASSESSMENT:  CLINICAL IMPRESSION: Pt demonstrates improved strength and ROM.  Pt pain continues to decrease.  Pt needs to rest but due to back pain not knee pain throughout therapy session.  Pt will continue to benefit from skilled therapy to progress towards goals and increase functional independence.    Evaluation:  Patient is a 62 y.o. female  who was seen today for physical therapy evaluation and treatment for s/p Lt arthroscopic surgery.  Evaluation demonstrates decreased ROM, decreased strength, decreased balance, decreased activity tolerance and increased pain. Ms. Maren Reamer will benefit from skilled PT to address these deficits and maximize her functional ability  OBJECTIVE IMPAIRMENTS: Abnormal gait, decreased activity tolerance, decreased balance, decreased mobility, difficulty walking, decreased ROM, increased edema, and pain.   ACTIVITY LIMITATIONS: carrying, lifting, bending, sitting, standing, stairs, and locomotion level  PARTICIPATION LIMITATIONS: cleaning, shopping, community activity, and occupation  PERSONAL FACTORS: Fitness are also affecting patient's functional outcome.   REHAB POTENTIAL: Good  CLINICAL DECISION MAKING: Stable/uncomplicated  EVALUATION COMPLEXITY: Low   GOALS: Goals reviewed with patient? Yes  SHORT TERM GOALS: Target date: 01/27/23 PT to be I in HEP in order to decrease her pain to no greater than a 4/10 Baseline: Goal status: IN PROGRESS  2.  PT core and LE strength increased to allow pt to be ambulating with a cane  Baseline:  Goal status: IN PROGRESS  3.  Pt ROM increased to 120 to be able to squat down.  Baseline:  Goal status: IN PROGRESS   LONG TERM GOALS: Target date: 02/17/2023  PT to be I in an advanced  HEP in order to decrease her pain to no greater than a 1/10 Baseline:  Goal status: IN PROGRESS   2.  PT core and LE strength increased to allow pt to  be ambulating without an assistive device  Baseline:  Goal status: IN PROGRESS   3.  PT to be able to go up and down 4 steps in a reciprocal manner  Baseline:  Goal status: IN PROGRESS   4.  PT to be able to be on her feet standing/walking for 2 hours at a time to be able to return to work  Baseline:  Goal status: IN PROGRESS   PLAN:  PT FREQUENCY: 2x/week  PT DURATION: 6 weeks  PLANNED INTERVENTIONS: Therapeutic exercises, Therapeutic activity, Gait training, Patient/Family education, Self Care, Joint mobilization, and Manual therapy  PLAN FOR NEXT SESSION: Progress WB strengthening as able, gait with LRAD.    Virgina Organ, PT CLT [phone]  01/15/2023, 9:05 AM

## 2023-01-20 ENCOUNTER — Ambulatory Visit (HOSPITAL_COMMUNITY): Payer: 59 | Admitting: Physical Therapy

## 2023-01-20 DIAGNOSIS — M6281 Muscle weakness (generalized): Secondary | ICD-10-CM | POA: Diagnosis not present

## 2023-01-20 DIAGNOSIS — M25562 Pain in left knee: Secondary | ICD-10-CM | POA: Diagnosis not present

## 2023-01-20 DIAGNOSIS — M25662 Stiffness of left knee, not elsewhere classified: Secondary | ICD-10-CM | POA: Diagnosis not present

## 2023-01-20 NOTE — Therapy (Signed)
OUTPATIENT PHYSICAL THERAPY TREATMENT  Patient Name: Jacqueline [name] MRN: [id number] DOB:04/18/1961, 62 y.o., female Today's Date: 01/20/2023  END OF SESSION:  PT End of Session - 01/20/23 1650     Visit Number 4    Number of Visits 12    Date for PT Re-Evaluation 02/17/23    Authorization Type [employer]    PT Start Time 1650    PT Stop Time 1730    PT Time Calculation (min) 40 min    Activity Tolerance Patient tolerated treatment well    Behavior During Therapy [facility] for tasks assessed/performed              Past Medical History:  Diagnosis Date   Anemia    Arthritis    per patient, back   Complication of anesthesia 2018   per patient HR dropped during partial hysterectomy surgery   Fibroid uterus    GERD (gastroesophageal reflux disease)    H/O total hysterectomy    Heart murmur    Hyperlipidemia    Borderline   Hypertension    Numbness and tingling in right hand    Pre-diabetes    borderline   Sleep apnea    Past Surgical History:  Procedure Laterality Date   BACK SURGERY     BALLOON DILATION N/A 05/22/2021   Procedure: BALLOON DILATION;  Surgeon: Lanelle Bal, DO;  [facility];  Service: Endoscopy;  Laterality: N/A;   BIOPSY  11/18/2022   Procedure: BIOPSY;  Surgeon: Corbin Ade, MD;  [facility];  Service: Endoscopy;;   CHOLECYSTECTOMY     COLONOSCOPY  2003   Dr. Jena Gauss: Anal canal hemorrhoids   COLONOSCOPY N/A 08/25/2014   Normal colonoscopy.  Next colonoscopy in 2026.  Procedure: COLONOSCOPY;  Surgeon: Corbin Ade, MD;  [facility];  Service: Endoscopy;  Laterality: N/A;  1200   ESOPHAGOGASTRODUODENOSCOPY N/A 08/25/2014   Procedure: ESOPHAGOGASTRODUODENOSCOPY (EGD);  Surgeon: Corbin Ade, MD;  [facility];  Service: Endoscopy;  Laterality: N/A;   ESOPHAGOGASTRODUODENOSCOPY (EGD) WITH PROPOFOL N/A 05/22/2021   Web and distal esophagus, dilated, gastriti, biopsy positive for mildly active  H. pylori gastritis.  s procedure: ESOPHAGOGASTRODUODENOSCOPY (EGD) WITH PROPOFOL;  Surgeon: Lanelle Bal, DO;  [facility];  Service: Endoscopy;  Laterality: N/A;  9:15am   ESOPHAGOGASTRODUODENOSCOPY (EGD) WITH PROPOFOL N/A 11/18/2022   Procedure: ESOPHAGOGASTRODUODENOSCOPY (EGD) WITH PROPOFOL;  Surgeon: Corbin Ade, MD;  [facility];  Service: Endoscopy;  Laterality: N/A;  2:15 pm, asa 2   KNEE ARTHROSCOPY WITH MEDIAL MENISECTOMY Left 12/25/2022   Procedure: KNEE ARTHROSCOPY WITH PARTIAL LATERAL MENISCECTOMY DEBRIDEMENT ACL;  Surgeon: Vickki Hearing, MD;  Location: [facility];  Service: Orthopedics;  Laterality: Left;   MALONEY DILATION N/A 11/18/2022   Procedure: Elease Hashimoto DILATION;  Surgeon: Corbin Ade, MD;  [facility];  Service: Endoscopy;  Laterality: N/A;   SALPINGOOPHORECTOMY Bilateral 11/06/2016   Procedure: BILATERAL SALPINGO OOPHORECTOMY;  Surgeon: Lazaro Arms, MD;  Location: [facility];  Service: Gynecology;  Laterality: Bilateral;   SUPRACERVICAL ABDOMINAL HYSTERECTOMY N/A 11/06/2016   Procedure: HYSTERECTOMY SUPRACERVICAL ABDOMINAL;  Surgeon: Lazaro Arms, MD;  Location: [facility];  Service: Gynecology;  Laterality: N/A;   TRANSFORAMINAL LUMBAR INTERBODY FUSION (TLIF) WITH PEDICLE SCREW FIXATION 2 LEVEL Left 07/28/2019   Procedure: LEFT-SIDED LUMBAR 4- 5, LUMBAR 5 -SACRUM1 TRANSFORAMINAL LUMBAR INTERBODY FUSION WITH INSTRUMENTATION AND ALLOGRAFT;  Surgeon: Estill Bamberg, MD;  Location:  [facility];  Service: Orthopedics;  Laterality: Left;   TUBAL LIGATION     Patient Active Problem List   Diagnosis Date Noted   S/P arthroscopy of left knee debridement of ACL/ lateral meniscectomy 12/25/22 01/02/2023   Bucket-handle tear of lateral meniscus of left knee as current injury 12/25/2022   Left anterior cruciate ligament tear 12/25/2022   Acute non-recurrent maxillary sinusitis 10/26/2022   Mucopurulent conjunctivitis of both eyes 10/26/2022    Acute bronchitis 10/25/2022   Maxillary sinusitis, acute 10/25/2022   Conjunctivitis 10/25/2022   Neck pain, chronic 08/28/2022   Sleep apnea 08/28/2022   Need for immunization against influenza 02/28/2022   Screening for cervical cancer 12/21/2021   H. pylori infection 12/05/2021   Snoring 11/23/2021   Insomnia 11/23/2021   Annual physical exam 10/16/2021   Dermatitis 10/16/2021   Need for varicella vaccine 10/16/2021   Diarrhea 07/05/2021   Stomach cramps 07/05/2021   Nausea & vomiting 07/05/2021   Encounter for vaccination 07/05/2021   Right foot pain 05/16/2021   Dysphagia 05/01/2021   Right shoulder pain 12/27/2020   Rash 12/13/2020   Chronic back pain 04/04/2020   History of back surgery 04/04/2020   Radiculopathy 07/28/2019   Borderline diabetes 12/25/2018   Hypertension 06/30/2018   S/P abdominal supracervical subtotal hysterectomy 11/06/2016   Trigger middle finger of right hand 04/24/2016   GERD (gastroesophageal reflux disease) 08/04/2014   Shoulder strain 07/26/2014   Degenerative arthritis of thumb 07/26/2014   Left knee pain 02/28/2014   Hip pain 04/15/2013   Epicondylitis, lateral (tennis elbow) 04/13/2013   Constipation 09/15/2012   Hyperlipidemia 11/28/2011   Obesity, Class I, BMI 30.0-34.9 (see actual BMI) 09/15/2011   ALLERGIC RHINITIS, SEASONAL 11/13/2007    PCP: Gilmore Laroche  REFERRING PROVIDER: Vickki Hearing, MD  REFERRING DIAG: Diagnosis [phone] (ICD-10-CM) - S/P arthroscopy of left knee  THERAPY DIAG:  Left knee pain Muscle weakness Difficulty in walking  Rationale for Evaluation and Treatment: Rehabilitation  ONSET DATE: 12/25/22  SUBJECTIVE STATEMENT: Pt states that she is doing some exercises every day she doesn't go thru them she just does some here and there.  Evaluation: Jacqueline [name] states that she had arthroscopic surgery on her knee on 12/25/22.  Pt states that she has been bending and trying to lift her leg up but it is  sore.   PT is unable to states how long she can stand, walk or sit for states she just doesn't know.    PERTINENT HISTORY: No chief complaint on file.   DOS: 12/25/22   POST OP PLAN  WB as tolerated SUTURES OUT IN A WEEK  AROM  BRACE playmaker brace for 6 weeks and PT for 6 weeks to emphasize quadricep strengthening and overall leg conditioning   Prognosis guarded.  Expect lateral compartment degeneration and chondrosis        PRE-OPERATIVE DIAGNOSIS:  Torn medical meniscus left knee   POST-OPERATIVE DIAGNOSIS:  torn lateral meniscus and partial tear acl    PROCEDURE:  Procedure(s): LEFT KNEE ARTHROSCOPY WITH PARTIAL LATERAL MEDIAL MENISCECTOMY AND DEBRIDEMENT OF ACL        PAIN:  Are you having pain? Yes: NPRS scale: 2/10 Pain location: left knee Pain description: sore Aggravating factors: WB Relieving factors: elevating and putting ice on it.    PRECAUTIONS: BRACE playmaker brace for 6 weeks and PT for 6 weeks to emphasize quadricep strengthening and overall leg conditioning     WEIGHT BEARING RESTRICTIONS:  WBAT FALLS:  Has  patient fallen in last 6 months? No  LIVING ENVIRONMENT: Lives with: lives with their spouse Lives in: Mobile home Stairs: Yes: External: 4 steps; on right going up Has following equipment at home: Single point cane and Walker - 4 wheeled  OCCUPATION: Conservation officer, [employer]  PLOF: Independent  PATIENT GOALS: walk better  NEXT MD VISIT: after therapy  OBJECTIVE:   COGNITION: Overall cognitive status: Within functional limits for tasks assessed      EDEMA:  Normal for post op   MUSCLE LENGTH: Hamstrings: Right 140 deg; Left 135 deg  PALPATION: tender  LOWER EXTREMITY ROM:  Active ROM Right eval Left eval left Left 01/20/23  Hip flexion      Hip extension      Hip abduction      Hip adduction      Hip internal rotation      Hip external rotation      Knee flexion  105 115 123  Knee extension  3 2 2   Ankle dorsiflexion      Ankle  plantarflexion      Ankle inversion      Ankle eversion       (Blank rows = not tested)  LOWER EXTREMITY MMT:  MMT Right eval Left eval  Hip flexion  2  Hip extension    Hip abduction  3-  Hip adduction    Hip internal rotation    Hip external rotation    Knee flexion  3+  Knee extension  3  Ankle dorsiflexion    Ankle plantarflexion    Ankle inversion    Ankle eversion     (Blank rows = not tested)    FUNCTIONAL TESTS: (with knee brace) 2 minute walk test: 322 with knee brace and rollaltor Single leg stance:  21" on RT; 3" on LT  Sit to stand:  5x  with UE assist: 44 seconds     TODAY'S TREATMENT:                                                                                                                              DATE:  01/20/23  Gait without AD x 226 ft.   Heel raise x 15 Squat x 15 Lt knee flexion 3# x 10 Lunge onto 4" box x 15 Step up 4" x 10   Lateral step up x 10  Wall squat x 10  Sit to stand x 15 no UE assist  Supine:   SLR x 10  Q-Set x 10 bridge x 15 Bike x 5 minutes  01/15/23 Heel raises x 15 Terminal extension x 10 Lunging onto 12" box x 5 Squat x 10 Lt knee flexion 3# x 10 Sitting : LAQ 3# x 15 Sit to stand x 10 no UE assist  Supine: SAQ 3# x 10 Quad set x 10 Heel slide x 10  SLR:  5  01/07/23 Standing:  heel raises 10X  Toe raises 10X  Lt knee flexion 10X Ambulation 200 feet with SPC Sitting:  LAQ 10X5"  Sit to stand 10X no UE Supine:  heelslides 10X  Quad sets 10X5"  SLR unable AROM 110 degrees flexion  Evaluation 01/06/23 Sitting:   LAQ x 10 Sit to stand x 5 Supine: Heel slide x 10 Quad set x 10  SLR x 5   Rt side lying :  ABduction x 5  Prone: Knee flexion x 10    PATIENT EDUCATION:  Education details: HEp Person educated: Patient Education method: Chief Technology Officer Education comprehension: verbalized understanding and returned demonstration  HOME EXERCISE PROGRAM: Access Code: [email or web address] URL:  [email or web address] Date: 01/06/2023 Prepared by: Virgina Organ  Exercises - Seated Long Arc Quad  - 3 x daily - 7 x weekly - 1 sets - 10 reps - 5" hold - Supine Quadricep Sets  - 3 x daily - 7 x weekly - 1 sets - 10 reps - 5" hold - Active Straight Leg Raise with Quad Set  - 2 x daily - 7 x weekly - 1 sets - 10 reps - 5" hold - Supine Heel Slide  - 2 x daily - 7 x weekly - 1 sets - 10 reps - 5" hold - Sit to Stand with Counter Support  - 3 x daily - 7 x weekly - 1 sets - 10 reps  ASSESSMENT:  CLINICAL IMPRESSION: Pt strength and ROM continues to improve with pt having decreasing pain.  Pt able to walk without assistive device without a limp.   Pt will continue to benefit from skilled therapy to progress towards goals and increase functional independence.    Evaluation:  Patient is a 62 y.o. female  who was seen today for physical therapy evaluation and treatment for s/p Lt arthroscopic surgery.  Evaluation demonstrates decreased ROM, decreased strength, decreased balance, decreased activity tolerance and increased pain. Jacqueline [name] will benefit from skilled PT to address these deficits and maximize her functional ability  OBJECTIVE IMPAIRMENTS: Abnormal gait, decreased activity tolerance, decreased balance, decreased mobility, difficulty walking, decreased ROM, increased edema, and pain.   ACTIVITY LIMITATIONS: carrying, lifting, bending, sitting, standing, stairs, and locomotion level  PARTICIPATION LIMITATIONS: cleaning, shopping, community activity, and occupation  PERSONAL FACTORS: Fitness are also affecting patient's functional outcome.   REHAB POTENTIAL: Good  CLINICAL DECISION MAKING: Stable/uncomplicated  EVALUATION COMPLEXITY: Low   GOALS: Goals reviewed with patient? Yes  SHORT TERM GOALS: Target date: 01/27/23 PT to be I in HEP in order to decrease her pain to no greater than a 4/10 Baseline: Goal status: IN PROGRESS  2.  PT core and LE strength  increased to allow pt to be ambulating with a cane  Baseline:  Goal status: IN PROGRESS  3.  Pt ROM increased to 120 to be able to squat down.  Baseline:  Goal status: IN PROGRESS   LONG TERM GOALS: Target date: 02/17/2023  PT to be I in an advanced  HEP in order to decrease her pain to no greater than a 1/10 Baseline:  Goal status: IN PROGRESS   2.  PT core and LE strength increased to allow pt to be ambulating without an assistive device  Baseline:  Goal status: IN PROGRESS   3.  PT to be able to go up and down 4 steps in a reciprocal manner  Baseline:  Goal status: IN PROGRESS   4.  PT to be able to be on her feet standing/walking for 2 hours at a time to  be able to return to work  Baseline:  Goal status: IN PROGRESS   PLAN:  PT FREQUENCY: 2x/week  PT DURATION: 6 weeks  PLANNED INTERVENTIONS: Therapeutic exercises, Therapeutic activity, Gait training, Patient/Family education, Self Care, Joint mobilization, and Manual therapy  PLAN FOR NEXT SESSION: Progress WB strengthening as able, gait with LRAD.    Virgina Organ, PT CLT [phone]  01/20/2023, 5:33 PM

## 2023-01-22 ENCOUNTER — Other Ambulatory Visit: Payer: Self-pay | Admitting: Family Medicine

## 2023-01-22 ENCOUNTER — Telehealth (HOSPITAL_COMMUNITY): Payer: Self-pay | Admitting: Physical Therapy

## 2023-01-22 ENCOUNTER — Encounter (HOSPITAL_COMMUNITY): Payer: 59

## 2023-01-22 ENCOUNTER — Encounter (HOSPITAL_COMMUNITY): Payer: 59 | Admitting: Physical Therapy

## 2023-01-22 ENCOUNTER — Telehealth: Payer: Self-pay

## 2023-01-22 DIAGNOSIS — I10 Essential (primary) hypertension: Secondary | ICD-10-CM

## 2023-01-22 MED ORDER — LEVOFLOXACIN 500 MG PO TABS: 500.0000 mg | ORAL_TABLET | Freq: Every day | ORAL | 0 refills | Status: AC

## 2023-01-22 MED ORDER — AMOXICILLIN 250 MG PO CAPS: 750.0000 mg | ORAL_CAPSULE | Freq: Three times a day (TID) | ORAL | 0 refills | Status: AC

## 2023-01-22 MED ORDER — PANTOPRAZOLE SODIUM 40 MG PO TBEC: 40.0000 mg | DELAYED_RELEASE_TABLET | Freq: Two times a day (BID) | ORAL | 3 refills | Status: DC

## 2023-01-22 NOTE — Telephone Encounter (Addendum)
Treatments thus far for H.pylori:  Prevpac(amoxicillin and clarithromycin)  but did not complete: had positive stool antigen thereafter Pylera (bismuth, metronidazole, tetracycline) completed. H.pylori stool antigen was positive.    EGD June 2024 with path noting H.pylori gastritis. Unable to complete culture/sensitivity despite our efforts at sending this.   Amoxicillin can be reused as lower resistance risk.  She needs to take pantoprazole 40 mg BID for 14 days Levofloxacin 500 mg once daily X 14 days And amoxicillin 750 mg TID for 14 days.      levofloxacin triple PPI (high dose or high potency) Twice daily 14   Levofloxacin (500 mg) Once daily    Amoxicillin (750 mg) Three times daily     I have sent this to her pharmacy. Please go over instructions with her. Thanks! Needs office visit in about 4 weeks.

## 2023-01-22 NOTE — Telephone Encounter (Signed)
First no show:  PT did not show for appointment.  Called PT, however therapist only got voice mail.  Let pt know that her next appointment was not until next Wednesday August 21st.   Virgina Organ, PT CLT [phone]

## 2023-01-22 NOTE — Telephone Encounter (Signed)
Pt was made aware and verbalized understanding. F/u appt was made for 02/25/2023, pt is aware.

## 2023-01-22 NOTE — Addendum Note (Signed)
Addended by: Gelene Mink on: 01/22/2023 11:31 AM   Modules accepted: Orders

## 2023-01-22 NOTE — Telephone Encounter (Signed)
Pt called wanting to know what to do about the h pylori infection that she still has. Looks like Dr. Jena Gauss has left prescribing something up to you. Please advise.

## 2023-01-24 ENCOUNTER — Ambulatory Visit (HOSPITAL_COMMUNITY): Payer: 59

## 2023-01-24 ENCOUNTER — Encounter (HOSPITAL_COMMUNITY): Payer: 59

## 2023-01-27 ENCOUNTER — Encounter: Payer: Self-pay | Admitting: Orthopedic Surgery

## 2023-01-27 ENCOUNTER — Encounter (HOSPITAL_COMMUNITY): Payer: 59 | Admitting: Physical Therapy

## 2023-01-27 ENCOUNTER — Ambulatory Visit (INDEPENDENT_AMBULATORY_CARE_PROVIDER_SITE_OTHER): Payer: 59 | Admitting: Orthopedic Surgery

## 2023-01-27 DIAGNOSIS — M23301 Other meniscus derangements, unspecified lateral meniscus, left knee: Secondary | ICD-10-CM

## 2023-01-27 DIAGNOSIS — M1712 Unilateral primary osteoarthritis, left knee: Secondary | ICD-10-CM

## 2023-01-27 DIAGNOSIS — G8918 Other acute postprocedural pain: Secondary | ICD-10-CM

## 2023-01-27 DIAGNOSIS — Z9889 Other specified postprocedural states: Secondary | ICD-10-CM

## 2023-01-27 NOTE — Patient Instructions (Signed)
Ice 3 x a day  Brace   Exercises   Return in 3 weeks

## 2023-01-27 NOTE — Progress Notes (Signed)
Encounter Diagnoses  Name Primary?   S/P arthroscopy of left knee debridement of ACL/ lateral meniscectomy 12/25/22 Yes   Unilateral primary osteoarthritis, left knee    Lateral meniscus derangement, left    Acute post-operative pain     Chief Complaint  Patient presents with   Post-op Follow-up    Left knee 12/25/22   Jacqueline [name] is doing well progressing nicely after her arthroscopy.  Her range of motion is improving she still has a little more swelling than I had like but she can straighten the knee fully and bend at 110 degrees  She will continue bracing, ice, knee exercises at home and follow-up with me in 3 weeks  No orders of the defined types were placed in this encounter.

## 2023-01-29 ENCOUNTER — Encounter (HOSPITAL_COMMUNITY): Payer: 59 | Admitting: Physical Therapy

## 2023-01-29 ENCOUNTER — Telehealth (HOSPITAL_COMMUNITY): Payer: Self-pay | Admitting: Physical Therapy

## 2023-01-29 NOTE — Telephone Encounter (Signed)
Pt with 2nd NS today.  Called and spoke to pt who states she did not receive a reminder for her appts.  Explained she needs to go by her written schedule as the remind call does not always work as should.  Reminded of next appt on Friday at 1pm.  Explained [facility] policy that she would need to schedule only one appt at a time.  All others have been cancelled at this time due to non compliance.   Lurena Nida, PTA/CLT [facility] Ph: [phone]

## 2023-01-30 ENCOUNTER — Telehealth: Payer: Self-pay | Admitting: Family Medicine

## 2023-01-30 NOTE — Telephone Encounter (Signed)
Prescription Request  01/30/2023  LOV: 12/03/2022  What is the name of the medication or equipment? DULoxetine (CYMBALTA) 30 MG capsule [[id number]]    Have you contacted your pharmacy to request a refill? Yes   Which pharmacy would you like this sent to?  [facility] [address], [place] - [address] [place] [address] Phone: [phone] Fax: [phone]   Patient notified that their request is being sent to the clinical staff for review and that they should receive a response within 2 business days.   Please advise at [facility] [phone]   Pt requesting call back in regard to sleep apnea referral

## 2023-01-31 ENCOUNTER — Ambulatory Visit (HOSPITAL_COMMUNITY): Payer: 59

## 2023-01-31 ENCOUNTER — Encounter (HOSPITAL_COMMUNITY): Payer: Self-pay

## 2023-01-31 DIAGNOSIS — M25562 Pain in left knee: Secondary | ICD-10-CM | POA: Diagnosis not present

## 2023-01-31 DIAGNOSIS — M6281 Muscle weakness (generalized): Secondary | ICD-10-CM

## 2023-01-31 DIAGNOSIS — M25662 Stiffness of left knee, not elsewhere classified: Secondary | ICD-10-CM

## 2023-01-31 NOTE — Therapy (Signed)
OUTPATIENT PHYSICAL THERAPY TREATMENT  Patient Name: Jacqueline [name] MRN: [id number] DOB:[birth year], 62 y.o., female Today's Date: 01/31/2023  END OF SESSION:  PT End of Session - 01/31/23 1302     Visit Number 5    Number of Visits 12    Date for PT Re-Evaluation 02/17/23    Authorization Type [employer]    PT Start Time 1303    PT Stop Time 1344    PT Time Calculation (min) 41 min    Activity Tolerance Patient tolerated treatment well    Behavior During Therapy [facility] for tasks assessed/performed              Past Medical History:  Diagnosis Date   Anemia    Arthritis    per patient, back   Complication of anesthesia 2018   per patient HR dropped during partial hysterectomy surgery   Fibroid uterus    GERD (gastroesophageal reflux disease)    H/O total hysterectomy    Heart murmur    Hyperlipidemia    Borderline   Hypertension    Numbness and tingling in right hand    Pre-diabetes    borderline   Sleep apnea    Past Surgical History:  Procedure Laterality Date   BACK SURGERY     BALLOON DILATION N/A 05/22/2021   Procedure: BALLOON DILATION;  Surgeon: Lanelle Bal, DO;  [facility];  Service: Endoscopy;  Laterality: N/A;   BIOPSY  11/18/2022   Procedure: BIOPSY;  Surgeon: Corbin Ade, MD;  Location: [facility];  Service: Endoscopy;;   CHOLECYSTECTOMY     COLONOSCOPY  2003   Dr. Jena Gauss: Anal canal hemorrhoids   COLONOSCOPY N/A 08/25/2014   Normal colonoscopy.  Next colonoscopy in 2026.  Procedure: COLONOSCOPY;  Surgeon: Corbin Ade, MD;  [facility]: [facility];  Service: Endoscopy;  Laterality: N/A;  1200   ESOPHAGOGASTRODUODENOSCOPY N/A 08/25/2014   Procedure: ESOPHAGOGASTRODUODENOSCOPY (EGD);  Surgeon: Corbin Ade, MD;  [facility]: [facility];  Service: Endoscopy;  Laterality: N/A;   ESOPHAGOGASTRODUODENOSCOPY (EGD) WITH PROPOFOL N/A 05/22/2021   Web and distal esophagus, dilated, gastriti, biopsy positive for mildly active  H. pylori gastritis.  s procedure: ESOPHAGOGASTRODUODENOSCOPY (EGD) WITH PROPOFOL;  Surgeon: Lanelle Bal, DO;  [facility]: [facility];  Service: Endoscopy;  Laterality: N/A;  9:15am   ESOPHAGOGASTRODUODENOSCOPY (EGD) WITH PROPOFOL N/A 11/18/2022   Procedure: ESOPHAGOGASTRODUODENOSCOPY (EGD) WITH PROPOFOL;  Surgeon: Corbin Ade, MD;  [facility]: [facility];  Service: Endoscopy;  Laterality: N/A;  2:15 pm, asa 2   KNEE ARTHROSCOPY WITH MEDIAL MENISECTOMY Left 12/25/2022   Procedure: KNEE ARTHROSCOPY WITH PARTIAL LATERAL MENISCECTOMY DEBRIDEMENT ACL;  Surgeon: Vickki Hearing, MD;  Location: [facility];  Service: Orthopedics;  Laterality: Left;   MALONEY DILATION N/A 11/18/2022   Procedure: Elease Hashimoto DILATION;  Surgeon: Corbin Ade, MD;  [facility]: [facility];  Service: Endoscopy;  Laterality: N/A;   SALPINGOOPHORECTOMY Bilateral 11/06/2016   Procedure: BILATERAL SALPINGO OOPHORECTOMY;  Surgeon: Lazaro Arms, MD;  Location: [facility];  Service: Gynecology;  Laterality: Bilateral;   SUPRACERVICAL ABDOMINAL HYSTERECTOMY N/A 11/06/2016   Procedure: HYSTERECTOMY SUPRACERVICAL ABDOMINAL;  Surgeon: Lazaro Arms, MD;  Location: [facility];  Service: Gynecology;  Laterality: N/A;   TRANSFORAMINAL LUMBAR INTERBODY FUSION (TLIF) WITH PEDICLE SCREW FIXATION 2 LEVEL Left 07/28/2019   Procedure: LEFT-SIDED LUMBAR 4- 5, LUMBAR 5 -SACRUM1 TRANSFORAMINAL LUMBAR INTERBODY FUSION WITH INSTRUMENTATION AND ALLOGRAFT;  Surgeon: Estill Bamberg, MD;  Location:  [facility];  Service: Orthopedics;  Laterality: Left;   TUBAL LIGATION     Patient Active Problem List   Diagnosis Date Noted   S/P arthroscopy of left knee debridement of ACL/ lateral meniscectomy 12/25/22 01/02/2023   Bucket-handle tear of lateral meniscus of left knee as current injury 12/25/2022   Left anterior cruciate ligament tear 12/25/2022   Acute non-recurrent maxillary sinusitis 10/26/2022   Mucopurulent conjunctivitis of both eyes 10/26/2022    Acute bronchitis 10/25/2022   Maxillary sinusitis, acute 10/25/2022   Conjunctivitis 10/25/2022   Neck pain, chronic 08/28/2022   Sleep apnea 08/28/2022   Need for immunization against influenza 02/28/2022   Screening for cervical cancer 12/21/2021   H. pylori infection 12/05/2021   Snoring 11/23/2021   Insomnia 11/23/2021   Annual physical exam 10/16/2021   Dermatitis 10/16/2021   Need for varicella vaccine 10/16/2021   Diarrhea 07/05/2021   Stomach cramps 07/05/2021   Nausea & vomiting 07/05/2021   Encounter for vaccination 07/05/2021   Right foot pain 05/16/2021   Dysphagia 05/01/2021   Right shoulder pain 12/27/2020   Rash 12/13/2020   Chronic back pain 04/04/2020   History of back surgery 04/04/2020   Radiculopathy 07/28/2019   Borderline diabetes 12/25/2018   Hypertension 06/30/2018   S/P abdominal supracervical subtotal hysterectomy 11/06/2016   Trigger middle finger of right hand 04/24/2016   GERD (gastroesophageal reflux disease) 08/04/2014   Shoulder strain 07/26/2014   Degenerative arthritis of thumb 07/26/2014   Left knee pain 02/28/2014   Hip pain 04/15/2013   Epicondylitis, lateral (tennis elbow) 04/13/2013   Constipation 09/15/2012   Hyperlipidemia 11/28/2011   Obesity, Class I, BMI 30.0-34.9 (see actual BMI) 09/15/2011   ALLERGIC RHINITIS, SEASONAL 11/13/2007    PCP: Gilmore Laroche  REFERRING PROVIDER: Vickki Hearing, MD  REFERRING DIAG: Diagnosis [id number] (ICD-10-CM) - S/P arthroscopy of left knee  THERAPY DIAG:  Left knee pain Muscle weakness Difficulty in walking  Rationale for Evaluation and Treatment: Rehabilitation  ONSET DATE: 12/25/22  SUBJECTIVE STATEMENT:  Pt stated she continues to have swelling around knee and ankles when standing for long periods of time.  Has been doing her exercises daily.  Reports she has LBP, scale 5/10.  Evaluation: Ms. Buerger states that she had arthroscopic surgery on her knee on 12/25/22.  Pt states that  she has been bending and trying to lift her leg up but it is sore.   PT is unable to states how long she can stand, walk or sit for states she just doesn't know.    PERTINENT HISTORY: No chief complaint on file.   DOS: 12/25/22   POST OP PLAN  WB as tolerated SUTURES OUT IN A WEEK  AROM  BRACE playmaker brace for 6 weeks and PT for 6 weeks to emphasize quadricep strengthening and overall leg conditioning   Prognosis guarded.  Expect lateral compartment degeneration and chondrosis        PRE-OPERATIVE DIAGNOSIS:  Torn medical meniscus left knee   POST-OPERATIVE DIAGNOSIS:  torn lateral meniscus and partial tear acl    PROCEDURE:  Procedure(s): LEFT KNEE ARTHROSCOPY WITH PARTIAL LATERAL MEDIAL MENISCECTOMY AND DEBRIDEMENT OF ACL        PAIN:  Are you having pain? Yes: NPRS scale: 2/10 Pain location: left knee Pain description: sore Aggravating factors: WB Relieving factors: elevating and putting ice on it.    PRECAUTIONS: BRACE playmaker brace for 6 weeks and PT for 6 weeks to emphasize quadricep strengthening and overall leg conditioning  WEIGHT BEARING RESTRICTIONS:  WBAT FALLS:  Has patient fallen in last 6 months? No  LIVING ENVIRONMENT: Lives with: lives with their spouse Lives in: Mobile home Stairs: Yes: External: 4 steps; on right going up Has following equipment at home: Single point cane and Walker - 4 wheeled  OCCUPATION: Conservation officer, [employer]  PLOF: Independent  PATIENT GOALS: walk better  NEXT MD VISIT: after therapy  OBJECTIVE:   COGNITION: Overall cognitive status: Within functional limits for tasks assessed      EDEMA:  Normal for post op   MUSCLE LENGTH: Hamstrings: Right 140 deg; Left 135 deg  PALPATION: tender  LOWER EXTREMITY ROM:  Active ROM Right eval Left eval left Left 01/20/23  Hip flexion      Hip extension      Hip abduction      Hip adduction      Hip internal rotation      Hip external rotation      Knee flexion  105 115  123  Knee extension  3 2 2   Ankle dorsiflexion      Ankle plantarflexion      Ankle inversion      Ankle eversion       (Blank rows = not tested)  LOWER EXTREMITY MMT:  MMT Right eval Left eval  Hip flexion  2  Hip extension    Hip abduction  3-  Hip adduction    Hip internal rotation    Hip external rotation    Knee flexion  3+  Knee extension  3  Ankle dorsiflexion    Ankle plantarflexion    Ankle inversion    Ankle eversion     (Blank rows = not tested)    FUNCTIONAL TESTS: (with knee brace) 2 minute walk test: 322 with knee brace and rollaltor Single leg stance:  21" on RT; 3" on LT  Sit to stand:  5x  with UE assist: 44 seconds     TODAY'S TREATMENT:                                                                                                                              DATE:  01/31/23: Heel raise x 15 Squat x 15 Toe raises declined slope 15x TKE GTB 10x 5" Sit to stand 15x eccentric control no HHA Lunge onto 4in step 15x Forward step up 4in 15 Lateral step up 4in 15x   01/20/23  Gait without AD x 226 ft.   Heel raise x 15 Squat x 15 Lt knee flexion 3# x 10 Lunge onto 4" box x 15 Step up 4" x 10   Lateral step up x 10  Wall squat x 10  Sit to stand x 15 no UE assist  Supine:   SLR x 10  Q-Set x 10 bridge x 15 Bike x 5 minutes  01/15/23 Heel raises x 15 Terminal extension x 10 Lunging onto 12" box x 5 Squat x 10  Lt knee flexion 3# x 10 Sitting : LAQ 3# x 15 Sit to stand x 10 no UE assist  Supine: SAQ 3# x 10 Quad set x 10 Heel slide x 10  SLR:  5  01/07/23 Standing:  heel raises 10X  Toe raises 10X  Lt knee flexion 10X Ambulation 200 feet with SPC Sitting:  LAQ 10X5"  Sit to stand 10X no UE Supine:  heelslides 10X  Quad sets 10X5"  SLR unable AROM 110 degrees flexion  Evaluation 01/06/23 Sitting:   LAQ x 10 Sit to stand x 5 Supine: Heel slide x 10 Quad set x 10  SLR x 5   Rt side lying :  ABduction x 5  Prone: Knee  flexion x 10    PATIENT EDUCATION:  Education details: HEp Person educated: Patient Education method: Chief Technology Officer Education comprehension: verbalized understanding and returned demonstration  HOME EXERCISE PROGRAM: Access Code: [email or web address] URL: [email or web address] Date: 01/06/2023 Prepared by: Virgina Organ  Exercises - Seated Long Arc Quad  - 3 x daily - 7 x weekly - 1 sets - 10 reps - 5" hold - Supine Quadricep Sets  - 3 x daily - 7 x weekly - 1 sets - 10 reps - 5" hold - Active Straight Leg Raise with Quad Set  - 2 x daily - 7 x weekly - 1 sets - 10 reps - 5" hold - Supine Heel Slide  - 2 x daily - 7 x weekly - 1 sets - 10 reps - 5" hold - Sit to Stand with Counter Support  - 3 x daily - 7 x weekly - 1 sets - 10 reps  ASSESSMENT:  CLINICAL IMPRESSION: Session focus with functional strengthening.  Pt limited with LBP, monitored through session.   Added TKE and squats to HEP with printout given.  Tolerated well to session with no reports of increased knee pain.  Evaluation:  Patient is a 62 y.o. female  who was seen today for physical therapy evaluation and treatment for s/p Lt arthroscopic surgery.  Evaluation demonstrates decreased ROM, decreased strength, decreased balance, decreased activity tolerance and increased pain. Ms. Maren Reamer will benefit from skilled PT to address these deficits and maximize her functional ability  OBJECTIVE IMPAIRMENTS: Abnormal gait, decreased activity tolerance, decreased balance, decreased mobility, difficulty walking, decreased ROM, increased edema, and pain.   ACTIVITY LIMITATIONS: carrying, lifting, bending, sitting, standing, stairs, and locomotion level  PARTICIPATION LIMITATIONS: cleaning, shopping, community activity, and occupation  PERSONAL FACTORS: Fitness are also affecting patient's functional outcome.   REHAB POTENTIAL: Good  CLINICAL DECISION MAKING: Stable/uncomplicated  EVALUATION COMPLEXITY:  Low   GOALS: Goals reviewed with patient? Yes  SHORT TERM GOALS: Target date: 01/27/23 PT to be I in HEP in order to decrease her pain to no greater than a 4/10 Baseline: Goal status: IN PROGRESS  2.  PT core and LE strength increased to allow pt to be ambulating with a cane  Baseline:  Goal status: IN PROGRESS  3.  Pt ROM increased to 120 to be able to squat down.  Baseline:  Goal status: IN PROGRESS   LONG TERM GOALS: Target date: 02/17/2023  PT to be I in an advanced  HEP in order to decrease her pain to no greater than a 1/10 Baseline:  Goal status: IN PROGRESS   2.  PT core and LE strength increased to allow pt to be ambulating without an assistive device  Baseline:  Goal status: IN PROGRESS   3.  PT to be able to go up and down 4 steps in a reciprocal manner  Baseline:  Goal status: IN PROGRESS   4.  PT to be able to be on her feet standing/walking for 2 hours at a time to be able to return to work  Baseline:  Goal status: IN PROGRESS   PLAN:  PT FREQUENCY: 2x/week  PT DURATION: 6 weeks  PLANNED INTERVENTIONS: Therapeutic exercises, Therapeutic activity, Gait training, Patient/Family education, Self Care, Joint mobilization, and Manual therapy  PLAN FOR NEXT SESSION: Progress WB strengthening as able, gait with LRAD.     Becky Sax, LPTA/CLT; CBIS [phone]   Juel Burrow, PTA 01/31/2023, 4:28 PM  01/31/2023, 4:28 PM

## 2023-02-01 ENCOUNTER — Other Ambulatory Visit: Payer: Self-pay | Admitting: Family Medicine

## 2023-02-02 ENCOUNTER — Other Ambulatory Visit: Payer: Self-pay | Admitting: Family Medicine

## 2023-02-02 DIAGNOSIS — G8929 Other chronic pain: Secondary | ICD-10-CM

## 2023-02-02 MED ORDER — DULOXETINE HCL 30 MG PO CPEP
30.0000 mg | ORAL_CAPSULE | Freq: Every day | ORAL | 0 refills | Status: DC
Start: 2023-02-02 — End: 2023-04-23

## 2023-02-02 NOTE — Telephone Encounter (Signed)
Yes please

## 2023-02-03 ENCOUNTER — Telehealth: Payer: Self-pay | Admitting: Orthopedic Surgery

## 2023-02-03 ENCOUNTER — Encounter (HOSPITAL_COMMUNITY): Payer: 59 | Admitting: Physical Therapy

## 2023-02-03 ENCOUNTER — Other Ambulatory Visit: Payer: Self-pay

## 2023-02-03 DIAGNOSIS — G4733 Obstructive sleep apnea (adult) (pediatric): Secondary | ICD-10-CM

## 2023-02-03 DIAGNOSIS — G4700 Insomnia, unspecified: Secondary | ICD-10-CM

## 2023-02-03 NOTE — Telephone Encounter (Signed)
Referral placed.

## 2023-02-03 NOTE — Telephone Encounter (Signed)
FMLA forms received. To [employer]. 

## 2023-02-05 ENCOUNTER — Encounter (HOSPITAL_COMMUNITY): Payer: 59 | Admitting: Physical Therapy

## 2023-02-07 ENCOUNTER — Encounter (HOSPITAL_COMMUNITY): Payer: 59 | Admitting: Physical Therapy

## 2023-02-09 DIAGNOSIS — Z419 Encounter for procedure for purposes other than remedying health state, unspecified: Secondary | ICD-10-CM | POA: Diagnosis not present

## 2023-02-11 ENCOUNTER — Ambulatory Visit (HOSPITAL_COMMUNITY): Payer: 59 | Attending: Internal Medicine | Admitting: Physical Therapy

## 2023-02-11 DIAGNOSIS — M6281 Muscle weakness (generalized): Secondary | ICD-10-CM | POA: Diagnosis not present

## 2023-02-11 DIAGNOSIS — M25562 Pain in left knee: Secondary | ICD-10-CM | POA: Insufficient documentation

## 2023-02-11 DIAGNOSIS — M25662 Stiffness of left knee, not elsewhere classified: Secondary | ICD-10-CM | POA: Insufficient documentation

## 2023-02-11 NOTE — Therapy (Signed)
OUTPATIENT PHYSICAL THERAPY TREATMENT  Patient Name: Jacqueline [name] MRN: [id number] DOB:[birth year], 61 y.o., female Today's Date: 02/11/2023  END OF SESSION:  PT End of Session - 02/11/23 1441     Visit Number 6    Number of Visits 12    Date for PT Re-Evaluation 02/17/23    Authorization Type [employer]    PT Start Time 1438    PT Stop Time 1518    PT Time Calculation (min) 40 min    Activity Tolerance Patient tolerated treatment well    Behavior During Therapy [facility] for tasks assessed/performed              Past Medical History:  Diagnosis Date   Anemia    Arthritis    per patient, back   Complication of anesthesia 2018   per patient HR dropped during partial hysterectomy surgery   Fibroid uterus    GERD (gastroesophageal reflux disease)    H/O total hysterectomy    Heart murmur    Hyperlipidemia    Borderline   Hypertension    Numbness and tingling in right hand    Pre-diabetes    borderline   Sleep apnea    Past Surgical History:  Procedure Laterality Date   BACK SURGERY     BALLOON DILATION N/A 05/22/2021   Procedure: BALLOON DILATION;  Surgeon: Lanelle Bal, DO;  [facility];  Service: Endoscopy;  Laterality: N/A;   BIOPSY  11/18/2022   Procedure: BIOPSY;  Surgeon: Corbin Ade, MD;  [facility];  Service: Endoscopy;;   CHOLECYSTECTOMY     COLONOSCOPY  2003   Dr. Jena Gauss: Anal canal hemorrhoids   COLONOSCOPY N/A 08/25/2014   Normal colonoscopy.  Next colonoscopy in 2026.  Procedure: COLONOSCOPY;  Surgeon: Corbin Ade, MD;  [facility];  Service: Endoscopy;  Laterality: N/A;  1200   ESOPHAGOGASTRODUODENOSCOPY N/A 08/25/2014   Procedure: ESOPHAGOGASTRODUODENOSCOPY (EGD);  Surgeon: Corbin Ade, MD;  [facility];  Service: Endoscopy;  Laterality: N/A;   ESOPHAGOGASTRODUODENOSCOPY (EGD) WITH PROPOFOL N/A 05/22/2021   Web and distal esophagus, dilated, gastriti, biopsy positive for mildly active  H. pylori gastritis.  s procedure: ESOPHAGOGASTRODUODENOSCOPY (EGD) WITH PROPOFOL;  Surgeon: Lanelle Bal, DO;  [facility];  Service: Endoscopy;  Laterality: N/A;  9:15am   ESOPHAGOGASTRODUODENOSCOPY (EGD) WITH PROPOFOL N/A 11/18/2022   Procedure: ESOPHAGOGASTRODUODENOSCOPY (EGD) WITH PROPOFOL;  Surgeon: Corbin Ade, MD;  [facility];  Service: Endoscopy;  Laterality: N/A;  2:15 pm, asa 2   KNEE ARTHROSCOPY WITH MEDIAL MENISECTOMY Left 12/25/2022   Procedure: KNEE ARTHROSCOPY WITH PARTIAL LATERAL MENISCECTOMY DEBRIDEMENT ACL;  Surgeon: Vickki Hearing, MD;  Location: [facility];  Service: Orthopedics;  Laterality: Left;   MALONEY DILATION N/A 11/18/2022   Procedure: Elease Hashimoto DILATION;  Surgeon: Corbin Ade, MD;  [facility];  Service: Endoscopy;  Laterality: N/A;   SALPINGOOPHORECTOMY Bilateral 11/06/2016   Procedure: BILATERAL SALPINGO OOPHORECTOMY;  Surgeon: Lazaro Arms, MD;  Location: [facility];  Service: Gynecology;  Laterality: Bilateral;   SUPRACERVICAL ABDOMINAL HYSTERECTOMY N/A 11/06/2016   Procedure: HYSTERECTOMY SUPRACERVICAL ABDOMINAL;  Surgeon: Lazaro Arms, MD;  Location: [facility];  Service: Gynecology;  Laterality: N/A;   TRANSFORAMINAL LUMBAR INTERBODY FUSION (TLIF) WITH PEDICLE SCREW FIXATION 2 LEVEL Left 07/28/2019   Procedure: LEFT-SIDED LUMBAR 4- 5, LUMBAR 5 -SACRUM1 TRANSFORAMINAL LUMBAR INTERBODY FUSION WITH INSTRUMENTATION AND ALLOGRAFT;  Surgeon: Estill Bamberg, MD;  Location:  [facility];  Service: Orthopedics;  Laterality: Left;   TUBAL LIGATION     Patient Active Problem List   Diagnosis Date Noted   S/P arthroscopy of left knee debridement of ACL/ lateral meniscectomy 12/25/22 01/02/2023   Bucket-handle tear of lateral meniscus of left knee as current injury 12/25/2022   Left anterior cruciate ligament tear 12/25/2022   Acute non-recurrent maxillary sinusitis 10/26/2022   Mucopurulent conjunctivitis of both eyes 10/26/2022    Acute bronchitis 10/25/2022   Maxillary sinusitis, acute 10/25/2022   Conjunctivitis 10/25/2022   Neck pain, chronic 08/28/2022   Sleep apnea 08/28/2022   Need for immunization against influenza 02/28/2022   Screening for cervical cancer 12/21/2021   H. pylori infection 12/05/2021   Snoring 11/23/2021   Insomnia 11/23/2021   Annual physical exam 10/16/2021   Dermatitis 10/16/2021   Need for varicella vaccine 10/16/2021   Diarrhea 07/05/2021   Stomach cramps 07/05/2021   Nausea & vomiting 07/05/2021   Encounter for vaccination 07/05/2021   Right foot pain 05/16/2021   Dysphagia 05/01/2021   Right shoulder pain 12/27/2020   Rash 12/13/2020   Chronic back pain 04/04/2020   History of back surgery 04/04/2020   Radiculopathy 07/28/2019   Borderline diabetes 12/25/2018   Hypertension 06/30/2018   S/P abdominal supracervical subtotal hysterectomy 11/06/2016   Trigger middle finger of right hand 04/24/2016   GERD (gastroesophageal reflux disease) 08/04/2014   Shoulder strain 07/26/2014   Degenerative arthritis of thumb 07/26/2014   Left knee pain 02/28/2014   Hip pain 04/15/2013   Epicondylitis, lateral (tennis elbow) 04/13/2013   Constipation 09/15/2012   Hyperlipidemia 11/28/2011   Obesity, Class I, BMI 30.0-34.9 (see actual BMI) 09/15/2011   ALLERGIC RHINITIS, SEASONAL 11/13/2007    PCP: Gilmore Laroche  REFERRING PROVIDER: Vickki Hearing, MD  REFERRING DIAG: Diagnosis [phone] (ICD-10-CM) - S/P arthroscopy of left knee  THERAPY DIAG:  Left knee pain Muscle weakness Difficulty in walking  Rationale for Evaluation and Treatment: Rehabilitation  ONSET DATE: 12/25/22  SUBJECTIVE STATEMENT:  Pt states her pain continues to be around 2/10 in Lt knee.  Has been doing her exercises daily.   Evaluation: [name] states that she had arthroscopic surgery on her knee on 12/25/22.  Pt states that she has been bending and trying to lift her leg up but it is sore.   PT is  unable to states how long she can stand, walk or sit for states she just doesn't know.   *Nov 2017 originally fell on concrete at work and injured lower back  PERTINENT HISTORY: No chief complaint on file.   DOS: 12/25/22   POST OP PLAN  WB as tolerated SUTURES OUT IN A WEEK  AROM  BRACE playmaker brace for 6 weeks and PT for 6 weeks to emphasize quadricep strengthening and overall leg conditioning   Prognosis guarded.  Expect lateral compartment degeneration and chondrosis        PRE-OPERATIVE DIAGNOSIS:  Torn medical meniscus left knee   POST-OPERATIVE DIAGNOSIS:  torn lateral meniscus and partial tear acl    PROCEDURE:  Procedure(s): LEFT KNEE ARTHROSCOPY WITH PARTIAL LATERAL MEDIAL MENISCECTOMY AND DEBRIDEMENT OF ACL        PAIN:  Are you having pain? Yes: NPRS scale: 2/10 Pain location: left knee Pain description: sore Aggravating factors: WB Relieving factors: elevating and putting ice on it.    PRECAUTIONS: BRACE playmaker brace for 6 weeks and PT for 6 weeks to emphasize quadricep strengthening and overall leg conditioning  WEIGHT BEARING RESTRICTIONS:  WBAT FALLS:  Has patient fallen in last 6 months? 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)  PLOF: Independent  PATIENT GOALS: walk better  NEXT MD VISIT: after therapy  OBJECTIVE:   COGNITION: Overall cognitive status: Within functional limits for tasks assessed      EDEMA:  Normal for post op   MUSCLE LENGTH: Hamstrings: Right 140 deg; Left 135 deg  PALPATION: tender  LOWER EXTREMITY ROM:  Active ROM Right eval Left eval left Left 01/20/23  Hip flexion      Hip extension      Hip abduction      Hip adduction      Hip internal rotation      Hip external rotation       Knee flexion  105 115 123  Knee extension  3 2 2   Ankle dorsiflexion      Ankle plantarflexion      Ankle inversion      Ankle eversion       (Blank rows = not tested)  LOWER EXTREMITY MMT:  MMT Right eval Left eval  Hip flexion  2  Hip extension    Hip abduction  3-  Hip adduction    Hip internal rotation    Hip external rotation    Knee flexion  3+  Knee extension  3  Ankle dorsiflexion    Ankle plantarflexion    Ankle inversion    Ankle eversion     (Blank rows = not tested)    FUNCTIONAL TESTS: (with knee brace) 2 minute walk test: 322 with knee brace and rollaltor Single leg stance:  21" on RT; 3" on LT  Sit to stand:  5x  with UE assist: 44 seconds     TODAY'S TREATMENT:                                                                                                                              DATE:  02/11/23: Standing:  Heel raise on incline x 20  Toe raises declined slope 20x Squat x 20  Lunge onto 4" step 20X each min UE assist  Forward step ups 4" 20X UE assist bilaterally  Lateral step ups 4" 20X UE assist bilaterally  Hip abduction bilaterally 20X each  Hip extension bilaterally 20X each Sit to stand 15x eccentric control no HHA  01/31/23: Heel raise x 15 Squat x 15 Toe raises declined slope 15x TKE GTB 10x 5" Sit to stand 15x eccentric control no HHA Lunge onto 4in step 15x Forward step up 4in 15 Lateral step up 4in 15x   01/20/23  Gait without AD x 226 ft.   Heel raise x 15 Squat x 15 Lt  knee flexion 3# x 10 Lunge onto 4" box x 15 Step up 4" x 10   Lateral step up x 10  Wall squat x 10  Sit to stand x 15 no UE assist  Supine:   SLR x 10  Q-Set x 10 bridge x 15 Bike x 5 minutes   01/15/23 Heel raises x 15 Terminal extension x 10 Lunging onto 12" box x 5 Squat x 10 Lt knee flexion 3# x 10 Sitting : LAQ 3# x 15 Sit to stand x 10 no UE assist  Supine: SAQ 3# x 10 Quad set x 10 Heel slide x 10  SLR:  5    01/07/23 Standing:  heel raises 10X  Toe raises 10X  Lt knee flexion 10X Ambulation 200 feet with SPC Sitting:  LAQ 10X5"  Sit to stand 10X no UE Supine:  heelslides 10X  Quad sets 10X5"  SLR unable AROM 110 degrees flexion  Evaluation 01/06/23 Sitting:   LAQ x 10 Sit to stand x 5 Supine: Heel slide x 10 Quad set x 10  SLR x 5   Rt side lying :  ABduction x 5  Prone: Knee flexion x 10    PATIENT EDUCATION:  Education details: HEp Person educated: Patient Education method: Chief Technology Officer Education comprehension: verbalized understanding and returned demonstration  HOME EXERCISE PROGRAM: Access Code: [email or web address] URL: [email or web address] Date: 01/06/2023 Prepared by: Virgina Organ  Exercises - Seated Long Arc Quad  - 3 x daily - 7 x weekly - 1 sets - 10 reps - 5" hold - Supine Quadricep Sets  - 3 x daily - 7 x weekly - 1 sets - 10 reps - 5" hold - Active Straight Leg Raise with Quad Set  - 2 x daily - 7 x weekly - 1 sets - 10 reps - 5" hold - Supine Heel Slide  - 2 x daily - 7 x weekly - 1 sets - 10 reps - 5" hold - Sit to Stand with Counter Support  - 3 x daily - 7 x weekly - 1 sets - 10 reps  ASSESSMENT:  CLINICAL IMPRESSION: Continued focus on LE functional strengthening.  No complaints of lower back pain during session, some discomfort faced with knee during squats.  Able to increase to 20 reps with most exercises and limited rest breaks needed.  Began hip strengthening as noted weak Lt hip flex/abd as tested at evaluation.  Tolerated well to session with no reports of increased knee pain.  Evaluation:  Patient is a 62 y.o. female  who was seen today for physical therapy evaluation and treatment for s/p Lt arthroscopic surgery.  Evaluation demonstrates decreased ROM, decreased strength, decreased balance, decreased activity tolerance and increased pain. Ms. Maren Reamer will benefit from skilled PT to address these deficits and maximize her  functional ability  OBJECTIVE IMPAIRMENTS: Abnormal gait, decreased activity tolerance, decreased balance, decreased mobility, difficulty walking, decreased ROM, increased edema, and pain.   ACTIVITY LIMITATIONS: carrying, lifting, bending, sitting, standing, stairs, and locomotion level  PARTICIPATION LIMITATIONS: cleaning, shopping, community activity, and occupation  PERSONAL FACTORS: Fitness are also affecting patient's functional outcome.   REHAB POTENTIAL: Good  CLINICAL DECISION MAKING: Stable/uncomplicated  EVALUATION COMPLEXITY: Low   GOALS: Goals reviewed with patient? Yes  SHORT TERM GOALS: Target date: 01/27/23 PT to be I in HEP in order to decrease her pain to no greater than a 4/10 Baseline: Goal status: IN PROGRESS  2.  PT core and LE strength increased to  allow pt to be ambulating with a cane  Baseline:  Goal status: IN PROGRESS  3.  Pt ROM increased to 120 to be able to squat down.  Baseline:  Goal status: IN PROGRESS   LONG TERM GOALS: Target date: 02/17/2023  PT to be I in an advanced  HEP in order to decrease her pain to no greater than a 1/10 Baseline:  Goal status: IN PROGRESS   2.  PT core and LE strength increased to allow pt to be ambulating without an assistive device  Baseline:  Goal status: IN PROGRESS   3.  PT to be able to go up and down 4 steps in a reciprocal manner  Baseline:  Goal status: IN PROGRESS   4.  PT to be able to be on her feet standing/walking for 2 hours at a time to be able to return to work  Baseline:  Goal status: IN PROGRESS   PLAN:  PT FREQUENCY: 2x/week  PT DURATION: 6 weeks  PLANNED INTERVENTIONS: Therapeutic exercises, Therapeutic activity, Gait training, Patient/Family education, Self Care, Joint mobilization, and Manual therapy  PLAN FOR NEXT SESSION: Progress WB strengthening as able, gait with LRAD.  Next session progress to bodycraft pulley machine for TKE's.    Bascom Levels, Madalen Gavin B, PTA 02/11/2023,  3:20 PM  02/11/2023, 3:20 PM

## 2023-02-12 ENCOUNTER — Encounter (HOSPITAL_COMMUNITY): Payer: 59 | Admitting: Physical Therapy

## 2023-02-13 ENCOUNTER — Other Ambulatory Visit: Payer: Self-pay | Admitting: Family Medicine

## 2023-02-14 ENCOUNTER — Encounter (HOSPITAL_COMMUNITY): Payer: 59

## 2023-02-17 ENCOUNTER — Ambulatory Visit (INDEPENDENT_AMBULATORY_CARE_PROVIDER_SITE_OTHER): Payer: 59 | Admitting: Orthopedic Surgery

## 2023-02-17 ENCOUNTER — Telehealth: Payer: Self-pay

## 2023-02-17 ENCOUNTER — Encounter: Payer: Self-pay | Admitting: Orthopedic Surgery

## 2023-02-17 DIAGNOSIS — M1712 Unilateral primary osteoarthritis, left knee: Secondary | ICD-10-CM

## 2023-02-17 DIAGNOSIS — S83232A Complex tear of medial meniscus, current injury, left knee, initial encounter: Secondary | ICD-10-CM

## 2023-02-17 DIAGNOSIS — M23301 Other meniscus derangements, unspecified lateral meniscus, left knee: Secondary | ICD-10-CM

## 2023-02-17 DIAGNOSIS — M238X2 Other internal derangements of left knee: Secondary | ICD-10-CM

## 2023-02-17 DIAGNOSIS — Z9889 Other specified postprocedural states: Secondary | ICD-10-CM

## 2023-02-17 NOTE — Telephone Encounter (Signed)
Pt approved from 02/17/2023 to 02/17/2024. Documentation signed and given to Darl Pikes for scan to chart. Pt was contacted through her MyChart regarding this.

## 2023-02-17 NOTE — Progress Notes (Signed)
This is a postop visit  Encounter Diagnoses  Name Primary?   S/P arthroscopy of left knee debridement of ACL/ lateral meniscectomy 12/25/22 Yes   Unilateral primary osteoarthritis, left knee    Lateral meniscus derangement, left    Complex tear of medial meniscus of left knee as current injury, initial encounter    Laxity of left anterior cruciate ligament     Chief Complaint  Patient presents with   Post-op Follow-up    Left knee scope 12/25/22 improving c/o soreness at times lateral and medial knee joint area    Jacqueline [name] is still struggling with quadriceps strength.  She cannot quite make a full knee extension.  She is having some medial and lateral pain.  Her arthroscopy findings indicated  PROCEDURE:  Procedure(s): LEFT KNEE ARTHROSCOPY WITH PARTIAL LATERAL MEDIAL MENISCECTOMY AND DEBRIDEMENT OF ACL     LATERAL - meniscus  BUCKET HANDLE TEAR IN RED WHITE ZONE TISSUE QUALITY POOR REDUCIBILITY POOR  -articular surface: GRADE 2 ON FEMUR AND TIBIA   PTF   -articular surface GRADE 2 DEPTH GRADE 3 DIAMETER CHONDROMALACIA   NOTCH  -acl PARTIAL TEAR POSTEROLATERAL BUNDLE  -pcl NORMAL    Exam showed weakness in quadriceps extensor lag mild swelling  Ambulated with brace no cane or walker  Plan So at this point she will be out of work 3 weeks we will see her in 3 weeks she will continue therapy for 3 weeks continue bracing.

## 2023-02-17 NOTE — Telephone Encounter (Signed)
PA done for Pantoprazole Sodium 40 mg DR Tablets. Dx used: K21.9 (GERD) and R13.10 (Dysphagia) on [employer]. Waiting on a response from [employer].

## 2023-02-25 ENCOUNTER — Telehealth: Payer: Self-pay

## 2023-02-25 ENCOUNTER — Ambulatory Visit (INDEPENDENT_AMBULATORY_CARE_PROVIDER_SITE_OTHER): Payer: 59 | Admitting: Gastroenterology

## 2023-02-25 ENCOUNTER — Encounter: Payer: Self-pay | Admitting: Gastroenterology

## 2023-02-25 VITALS — BP 121/78 | HR 73 | Temp 97.8°F | Ht 62.5 in | Wt 180.4 lb

## 2023-02-25 DIAGNOSIS — K219 Gastro-esophageal reflux disease without esophagitis: Secondary | ICD-10-CM

## 2023-02-25 DIAGNOSIS — A048 Other specified bacterial intestinal infections: Secondary | ICD-10-CM | POA: Diagnosis not present

## 2023-02-25 NOTE — Patient Instructions (Addendum)
Today is last day to take the pantoprazole. You will not take it for the next 14 days, then you can have the H.pylori breath test done on October 2nd!  After you complete the breath test, you can start your pantoprazole back.  Please make sure no antibiotics within 2 weeks, no pepto bismol. You can take Tums or Pepcid for reflux flares during this time.    We will see you in 3 months!  I enjoyed seeing you again today! I value our relationship and want to provide genuine, compassionate, and quality care. You may receive a survey regarding your visit with me, and I welcome your feedback! Thanks so much for taking the time to complete this. I look forward to seeing you again.      Gelene Mink, PhD, ANP-BC [facility]

## 2023-02-25 NOTE — Telephone Encounter (Signed)
Pt mailed her AVS/ instructions to what and what not to eat.

## 2023-02-25 NOTE — Progress Notes (Signed)
Gastroenterology Office Note     Primary Care Physician:  Gilmore Laroche, FNP  Primary Gastroenterologist: Dr. Jena Gauss    Chief Complaint   Chief Complaint  Patient presents with   Follow-up    Follow up after H Pylori     History of Present Illness   [name] is a 62 y.o. female presenting today with a history of GERD, dysphagia, LPR, H.pylori gastritis s/p treatment X2 and with persistent evidence of infection on recent EGD in June 2024.   She was prescribed pantoprazole BID, levofloxacin daily X 14 days, and amoxicillin 750 mg TID for 14 days.   Occasional GERD. Sometimes taking PPI after eating. No abdominal pain. No dysphagia. Needs H.pylori eradication testing. Prefers breath test instead of stool antigen.     EGD November 18, 2022: mild Schatzki ring s/p dilation, small hiatal hernia, normal duodenum, gastric biopsies positive for H.pylori.    Colonoscopy up-to-date, due in 2026.  Last EGD 2022 with esophageal web s/p dilation,  H.pylori gastritis.     Past Medical History:  Diagnosis Date   Anemia    Arthritis    per patient, back   Complication of anesthesia 2018   per patient HR dropped during partial hysterectomy surgery   Fibroid uterus    GERD (gastroesophageal reflux disease)    H/O total hysterectomy    Heart murmur    Hyperlipidemia    Borderline   Hypertension    Numbness and tingling in right hand    Pre-diabetes    borderline   Sleep apnea     Past Surgical History:  Procedure Laterality Date   BACK SURGERY     BALLOON DILATION N/A 05/22/2021   Procedure: BALLOON DILATION;  Surgeon: Lanelle Bal, DO;  [facility];  Service: Endoscopy;  Laterality: N/A;   BIOPSY  11/18/2022   Procedure: BIOPSY;  Surgeon: Corbin Ade, MD;  [facility];  Service: Endoscopy;;   CHOLECYSTECTOMY     COLONOSCOPY  2003   Dr. Jena Gauss: Anal canal hemorrhoids   COLONOSCOPY N/A 08/25/2014   Normal colonoscopy.  Next  colonoscopy in 2026.  Procedure: COLONOSCOPY;  Surgeon: Corbin Ade, MD;  [facility];  Service: Endoscopy;  Laterality: N/A;  1200   ESOPHAGOGASTRODUODENOSCOPY N/A 08/25/2014   Procedure: ESOPHAGOGASTRODUODENOSCOPY (EGD);  Surgeon: Corbin Ade, MD;  [facility];  Service: Endoscopy;  Laterality: N/A;   ESOPHAGOGASTRODUODENOSCOPY (EGD) WITH PROPOFOL N/A 05/22/2021   Web and distal esophagus, dilated, gastriti, biopsy positive for mildly active H. pylori gastritis.  s procedure: ESOPHAGOGASTRODUODENOSCOPY (EGD) WITH PROPOFOL;  Surgeon: Lanelle Bal, DO;  [facility];  Service: Endoscopy;  Laterality: N/A;  9:15am   ESOPHAGOGASTRODUODENOSCOPY (EGD) WITH PROPOFOL N/A 11/18/2022   Procedure: ESOPHAGOGASTRODUODENOSCOPY (EGD) WITH PROPOFOL;  Surgeon: Corbin Ade, MD;  [facility];  Service: Endoscopy;  Laterality: N/A;  2:15 pm, asa 2   KNEE ARTHROSCOPY WITH MEDIAL MENISECTOMY Left 12/25/2022   Procedure: KNEE ARTHROSCOPY WITH PARTIAL LATERAL MENISCECTOMY DEBRIDEMENT ACL;  Surgeon: Vickki Hearing, MD;  [facility]: [facility];  Service: Orthopedics;  Laterality: Left;   MALONEY DILATION N/A 11/18/2022   Procedure: Elease Hashimoto DILATION;  Surgeon: Corbin Ade, MD;  [facility];  Service: Endoscopy;  Laterality: N/A;   SALPINGOOPHORECTOMY Bilateral 11/06/2016   Procedure: BILATERAL SALPINGO OOPHORECTOMY;  Surgeon: Lazaro Arms, MD;  [facility]: [facility];  Service: Gynecology;  Laterality: Bilateral;  SUPRACERVICAL ABDOMINAL HYSTERECTOMY N/A 11/06/2016   Procedure: HYSTERECTOMY SUPRACERVICAL ABDOMINAL;  Surgeon: Lazaro Arms, MD;  [facility]: [facility];  Service: Gynecology;  Laterality: N/A;   TRANSFORAMINAL LUMBAR INTERBODY FUSION (TLIF) WITH PEDICLE SCREW FIXATION 2 LEVEL Left 07/28/2019   Procedure: LEFT-SIDED LUMBAR 4- 5, LUMBAR 5 -SACRUM1 TRANSFORAMINAL LUMBAR INTERBODY FUSION WITH INSTRUMENTATION AND ALLOGRAFT;  Surgeon: Estill Bamberg,  MD;  [facility];  Service: Orthopedics;  Laterality: Left;   TUBAL LIGATION      Current Outpatient Medications  Medication Sig Dispense Refill   acyclovir (ZOVIRAX) 400 MG tablet Take 400 mg by mouth 3 (three) times daily as needed (fever blisters).     amLODipine (NORVASC) 5 MG tablet Take 1 tablet by mouth once daily 90 tablet 0   atorvastatin (LIPITOR) 20 MG tablet Take 1 tablet by mouth once daily 90 tablet 0   azelastine (ASTELIN) 0.1 % nasal spray Place 1 spray into both nostrils 2 (two) times daily as needed for rhinitis. Use in each nostril as directed     diclofenac (VOLTAREN) 75 MG EC tablet TAKE 1 TABLET BY MOUTH TWICE DAILY WITH A MEAL 60 tablet 5   DULoxetine (CYMBALTA) 30 MG capsule Take 1 capsule (30 mg total) by mouth daily. 90 capsule 0   estradiol (ESTRACE) 0.1 MG/GM vaginal cream INSERT 2 GRAMS VAGINALLY AS DIRECTED EVERY OTHER NIGHT 43 g 11   fluticasone (FLONASE) 50 MCG/ACT nasal spray Place 1 spray into both nostrils daily. (Patient taking differently: Place 1 spray into both nostrils daily as needed for allergies.) 16 g 6   ibuprofen (ADVIL) 800 MG tablet Take 1 tablet (800 mg total) by mouth every 8 (eight) hours as needed. 90 tablet 1   methocarbamol (ROBAXIN-750) 750 MG tablet Take 1 tablet (750 mg total) by mouth 4 (four) times daily. (Patient taking differently: Take 750 mg by mouth every 6 (six) hours as needed for muscle spasms.) 30 tablet 0   Multiple Vitamin (MULTIVITAMIN WITH MINERALS) TABS tablet Take 1 tablet by mouth daily with supper. One-A-Day Multivitamin     pantoprazole (PROTONIX) 40 MG tablet Take 1 tablet (40 mg total) by mouth 2 (two) times daily before a meal. (Patient taking differently: Take 40 mg by mouth daily.) 180 tablet 0   pantoprazole (PROTONIX) 40 MG tablet Take 1 tablet (40 mg total) by mouth 2 (two) times daily before a meal. For 14 days while taking antibiotics for H.pylori, then resume to once daily. 60 tablet 3   traZODone (DESYREL)  50 MG tablet Take 0.5-1 tablets (25-50 mg total) by mouth at bedtime as needed for sleep. 30 tablet 3   No current facility-administered medications for this visit.    Allergies as of 02/25/2023   (No Known Allergies)    Family History  Problem Relation Age of Onset   Breast cancer Maternal Aunt    Hypertension Maternal Aunt    Cancer Maternal Aunt        Breast   Cancer Maternal Grandmother         lymphnodes   Colon cancer Neg Hx     Social History   Socioeconomic History   Marital status: Married    Spouse name: Not on file   Number of children: 3   Years of education: Not on file   Highest education level: Not on file  Occupational History   Occupation: PT at [employer]  Tobacco Use   Smoking status: Never   Smokeless tobacco: Never  Tobacco comments:    Never smoked  Vaping Use   Vaping status: Never Used  Substance and Sexual Activity   Alcohol use: No    Alcohol/week: 0.0 standard drinks of alcohol   Drug use: No   Sexual activity: Yes    Birth control/protection: Post-menopausal, Surgical  Other Topics Concern   Not on file  Social History Narrative   Lives with her spouse, work part at [employer]    Social Determinants of Health   Financial Resource Strain: Not on file  Food Insecurity: Not on file  Transportation Needs: Not on file  Physical Activity: Not on file  Stress: Not on file  Social Connections: Not on file  Intimate Partner Violence: Not on file     Review of Systems   Gen: Denies any fever, chills, fatigue, weight loss, lack of appetite.  CV: Denies chest pain, heart palpitations, peripheral edema, syncope.  Resp: Denies shortness of breath at rest or with exertion. Denies wheezing or cough.  GI: Denies dysphagia or odynophagia. Denies jaundice, hematemesis, fecal incontinence. GU : Denies urinary burning, urinary frequency, urinary hesitancy MS: Denies joint pain, muscle weakness, cramps, or limitation of movement.  Derm: Denies  rash, itching, dry skin Psych: Denies depression, anxiety, memory loss, and confusion Heme: Denies bruising, bleeding, and enlarged lymph nodes.   Physical Exam   BP 121/78   Pulse 73   Temp 97.8 F (36.6 C)   Ht 5' 2.5" (1.588 m)   Wt 180 lb 6.4 oz (81.8 kg)   LMP 10/26/2016 (Exact Date)   BMI 32.47 kg/m  General:   Alert and oriented. Pleasant and cooperative. Well-nourished and well-developed.  Head:  Normocephalic and atraumatic. Eyes:  Without icterus Abdomen:  +BS, soft, non-tender and non-distended. No HSM noted. No guarding or rebound. No masses appreciated.  Rectal:  Deferred  Msk:  Symmetrical without gross deformities. Normal posture. Extremities:  Without edema. Neurologic:  Alert and  oriented x4;  grossly normal neurologically. Skin:  Intact without significant lesions or rashes. Psych:  Alert and cooperative. Normal mood and affect.   Assessment   Jacqueline [name] is a 62 y.o. female presenting today with a history of GERD, dysphagia, LPR, H.pylori gastritis s/p treatment X2 and with persistent evidence of infection on recent EGD in June 2024.   H.pylori: completed 3rd round of treatment. We will check for eradication via breath test, holding PPI X 2 weeks, no abx, and no bismuth preparations. She will have this done approximately 03/12/23.  GERD: occasional flares related to food choices. Holding PPI for now until after breath test, then may resume BID. Discussed diet changes and hopefully can decrease to once daily in the future.     PLAN    Urea breath test in 2 weeks Holding PPI, antibiotics, any bismuth preparations till that time 3 month follow-up   Gelene Mink, PhD, ANP-BC [facility]

## 2023-02-26 ENCOUNTER — Ambulatory Visit (HOSPITAL_COMMUNITY): Payer: 59

## 2023-02-26 DIAGNOSIS — M25562 Pain in left knee: Secondary | ICD-10-CM

## 2023-02-26 DIAGNOSIS — M25662 Stiffness of left knee, not elsewhere classified: Secondary | ICD-10-CM | POA: Diagnosis not present

## 2023-02-26 DIAGNOSIS — M6281 Muscle weakness (generalized): Secondary | ICD-10-CM

## 2023-02-26 NOTE — Therapy (Signed)
OUTPATIENT PHYSICAL THERAPY TREATMENT/ PROGRESS NOT Progress Note Reporting Period 01/06/2023 to 02/26/2023  See note below for Objective Data and Assessment of Progress/Goals.       Patient Name: QUANTASIA WAMBLES MRN: [id number] DOB:[birth year], 62 y.o., female Today's Date: 02/26/2023  END OF SESSION:  PT End of Session - 02/26/23 1347     Visit Number 7    Number of Visits 20    Date for PT Re-Evaluation 03/26/23    Authorization Type [employer]    PT Start Time 1345    PT Stop Time 1424    PT Time Calculation (min) 39 min    Activity Tolerance Patient tolerated treatment well    Behavior During Therapy [facility] for tasks assessed/performed              Past Medical History:  Diagnosis Date   Anemia    Arthritis    per patient, back   Complication of anesthesia 2018   per patient HR dropped during partial hysterectomy surgery   Fibroid uterus    GERD (gastroesophageal reflux disease)    H/O total hysterectomy    Heart murmur    Hyperlipidemia    Borderline   Hypertension    Numbness and tingling in right hand    Pre-diabetes    borderline   Sleep apnea    Past Surgical History:  Procedure Laterality Date   BACK SURGERY     BALLOON DILATION N/A 05/22/2021   Procedure: BALLOON DILATION;  Surgeon: Lanelle Bal, DO;  [facility];  Service: Endoscopy;  Laterality: N/A;   BIOPSY  11/18/2022   Procedure: BIOPSY;  Surgeon: Corbin Ade, MD;  [facility];  Service: Endoscopy;;   CHOLECYSTECTOMY     COLONOSCOPY  2003   Dr. Jena Gauss: Anal canal hemorrhoids   COLONOSCOPY N/A 08/25/2014   Normal colonoscopy.  Next colonoscopy in 2026.  Procedure: COLONOSCOPY;  Surgeon: Corbin Ade, MD;  [facility];  Service: Endoscopy;  Laterality: N/A;  1200   ESOPHAGOGASTRODUODENOSCOPY N/A 08/25/2014   Procedure: ESOPHAGOGASTRODUODENOSCOPY (EGD);  Surgeon: Corbin Ade, MD;  [facility];  Service: Endoscopy;  Laterality:  N/A;   ESOPHAGOGASTRODUODENOSCOPY (EGD) WITH PROPOFOL N/A 05/22/2021   Web and distal esophagus, dilated, gastriti, biopsy positive for mildly active H. pylori gastritis.  s procedure: ESOPHAGOGASTRODUODENOSCOPY (EGD) WITH PROPOFOL;  Surgeon: Lanelle Bal, DO;  [facility];  Service: Endoscopy;  Laterality: N/A;  9:15am   ESOPHAGOGASTRODUODENOSCOPY (EGD) WITH PROPOFOL N/A 11/18/2022   Procedure: ESOPHAGOGASTRODUODENOSCOPY (EGD) WITH PROPOFOL;  Surgeon: Corbin Ade, MD;  [facility];  Service: Endoscopy;  Laterality: N/A;  2:15 pm, asa 2   KNEE ARTHROSCOPY WITH MEDIAL MENISECTOMY Left 12/25/2022   Procedure: KNEE ARTHROSCOPY WITH PARTIAL LATERAL MENISCECTOMY DEBRIDEMENT ACL;  Surgeon: Vickki Hearing, MD;  [facility]: [facility];  Service: Orthopedics;  Laterality: Left;   MALONEY DILATION N/A 11/18/2022   Procedure: Elease Hashimoto DILATION;  Surgeon: Corbin Ade, MD;  [facility];  Service: Endoscopy;  Laterality: N/A;   SALPINGOOPHORECTOMY Bilateral 11/06/2016   Procedure: BILATERAL SALPINGO OOPHORECTOMY;  Surgeon: Lazaro Arms, MD;  [facility];  Service: Gynecology;  Laterality: Bilateral;   SUPRACERVICAL ABDOMINAL HYSTERECTOMY N/A 11/06/2016   Procedure: HYSTERECTOMY SUPRACERVICAL ABDOMINAL;  Surgeon: Lazaro Arms, MD;  [facility]: [facility];  Service: Gynecology;  Laterality: N/A;   TRANSFORAMINAL LUMBAR INTERBODY FUSION (TLIF) WITH PEDICLE SCREW FIXATION 2 LEVEL Left 07/28/2019  Procedure: LEFT-SIDED LUMBAR 4- 5, LUMBAR 5 -SACRUM1 TRANSFORAMINAL LUMBAR INTERBODY FUSION WITH INSTRUMENTATION AND ALLOGRAFT;  Surgeon: Estill Bamberg, MD;  Location[facility];  Service: Orthopedics;  Laterality: Left;   TUBAL LIGATION     Patient Active Problem List   Diagnosis Date Noted   S/P arthroscopy of left knee debridement of ACL/ lateral meniscectomy 12/25/22 01/02/2023   Bucket-handle tear of lateral meniscus of left knee as current injury 12/25/2022   Left  anterior cruciate ligament tear 12/25/2022   Acute non-recurrent maxillary sinusitis 10/26/2022   Mucopurulent conjunctivitis of both eyes 10/26/2022   Acute bronchitis 10/25/2022   Maxillary sinusitis, acute 10/25/2022   Conjunctivitis 10/25/2022   Neck pain, chronic 08/28/2022   Sleep apnea 08/28/2022   Need for immunization against influenza 02/28/2022   Screening for cervical cancer 12/21/2021   H. pylori infection 12/05/2021   Snoring 11/23/2021   Insomnia 11/23/2021   Annual physical exam 10/16/2021   Dermatitis 10/16/2021   Need for varicella vaccine 10/16/2021   Diarrhea 07/05/2021   Stomach cramps 07/05/2021   Nausea & vomiting 07/05/2021   Encounter for vaccination 07/05/2021   Right foot pain 05/16/2021   Dysphagia 05/01/2021   Right shoulder pain 12/27/2020   Rash 12/13/2020   Chronic back pain 04/04/2020   History of back surgery 04/04/2020   Radiculopathy 07/28/2019   Borderline diabetes 12/25/2018   Hypertension 06/30/2018   S/P abdominal supracervical subtotal hysterectomy 11/06/2016   Trigger middle finger of right hand 04/24/2016   GERD (gastroesophageal reflux disease) 08/04/2014   Shoulder strain 07/26/2014   Degenerative arthritis of thumb 07/26/2014   Left knee pain 02/28/2014   Hip pain 04/15/2013   Epicondylitis, lateral (tennis elbow) 04/13/2013   Constipation 09/15/2012   Hyperlipidemia 11/28/2011   Obesity, Class I, BMI 30.0-34.9 (see actual BMI) 09/15/2011   ALLERGIC RHINITIS, SEASONAL 11/13/2007    PCP: Gilmore Laroche  REFERRING PROVIDER: Vickki Hearing, MD  REFERRING DIAG: Diagnosis [phone] (ICD-10-CM) - S/P arthroscopy of left knee  THERAPY DIAG:  Left knee pain Muscle weakness Difficulty in walking  Rationale for Evaluation and Treatment: Rehabilitation  ONSET DATE: 12/25/22  Returns to MD 9/30  SUBJECTIVE STATEMENT:  "70% better"; still having some pain in the knee if she twists her knee; uneven surfaces; now her right  leg is bothering her some; right hip and leg; left knee pain 3/10   Evaluation: Ms. Widen states that she had arthroscopic surgery on her knee on 12/25/22.  Pt states that she has been bending and trying to lift her leg up but it is sore.   PT is unable to states how long she can stand, walk or sit for states she just doesn't know.   *Nov 2017 originally fell on concrete at work and injured lower back  PERTINENT HISTORY: No chief complaint on file.   DOS: 12/25/22   POST OP PLAN  WB as tolerated SUTURES OUT IN A WEEK  AROM  BRACE playmaker brace for 6 weeks and PT for 6 weeks to emphasize quadricep strengthening and overall leg conditioning   Prognosis guarded.  Expect lateral compartment degeneration and chondrosis        PRE-OPERATIVE DIAGNOSIS:  Torn medical meniscus left knee   POST-OPERATIVE DIAGNOSIS:  torn lateral meniscus and partial tear acl    PROCEDURE:  Procedure(s): LEFT KNEE ARTHROSCOPY WITH PARTIAL LATERAL MEDIAL MENISCECTOMY AND DEBRIDEMENT OF ACL        PAIN:  Are you having pain? Yes: NPRS scale: 2/10 Pain  location: left knee Pain description: sore Aggravating factors: WB Relieving factors: elevating and putting ice on it.    PRECAUTIONS: BRACE playmaker brace for 6 weeks and PT for 6 weeks to emphasize quadricep strengthening and overall leg conditioning     WEIGHT BEARING RESTRICTIONS:  WBAT FALLS:  Has patient fallen in last 6 months? 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)  PLOF: Independent  PATIENT GOALS: walk better  NEXT MD VISIT: after therapy  OBJECTIVE:   COGNITION: Overall cognitive status: Within functional limits for tasks assessed      EDEMA:  Normal for post op   MUSCLE LENGTH: Hamstrings: Right 140  deg; Left 135 deg  PALPATION: tender  LOWER EXTREMITY ROM:  Active ROM Right eval Left eval left Left 01/20/23 Left 02/26/23  Hip flexion       Hip extension       Hip abduction       Hip adduction       Hip internal rotation       Hip external rotation       Knee flexion  105 115 123 115  Knee extension  3 2 2  -5  Ankle dorsiflexion       Ankle plantarflexion       Ankle inversion       Ankle eversion        (Blank rows = not tested)  LOWER EXTREMITY MMT:  MMT Right eval Left eval Left 02/26/23  Hip flexion  2 3+  Hip extension     Hip abduction  3-   Hip adduction     Hip internal rotation     Hip external rotation     Knee flexion  3+ 4+  Knee extension  3 4+  Ankle dorsiflexion   5  Ankle plantarflexion     Ankle inversion     Ankle eversion      (Blank rows = not tested)    FUNCTIONAL TESTS: (with knee brace) 2 minute walk test: 322 with knee brace and rollaltor Single leg stance:  21" on RT; 3" on LT  Sit to stand:  5x  with UE assist: 44 seconds     TODAY'S TREATMENT:                                                                                                                              DATE:  02/26/23 Progress note 2 MWT 272 ft with knee brace and no AD SLS 7" on left MMT's and AROM see above 5 x sit to stand 30.06 no UE assist (back pain) 4" steps with bilat UE assist reciprocal pattern  02/11/23: Standing:  Heel raise on incline x 20  Toe raises declined  slope 20x Squat x 20  Lunge onto 4" step 20X each min UE assist  Forward step ups 4" 20X UE assist bilaterally  Lateral step ups 4" 20X UE assist bilaterally  Hip abduction bilaterally 20X each  Hip extension bilaterally 20X each Sit to stand 15x eccentric control no HHA  01/31/23: Heel raise x 15 Squat x 15 Toe raises declined slope 15x TKE GTB 10x 5" Sit to stand 15x eccentric control no HHA Lunge onto 4in step 15x Forward step up 4in 15 Lateral step up 4in 15x   01/20/23   Gait without AD x 226 ft.   Heel raise x 15 Squat x 15 Lt knee flexion 3# x 10 Lunge onto 4" box x 15 Step up 4" x 10   Lateral step up x 10  Wall squat x 10  Sit to stand x 15 no UE assist  Supine:   SLR x 10  Q-Set x 10 bridge x 15 Bike x 5 minutes   01/15/23 Heel raises x 15 Terminal extension x 10 Lunging onto 12" box x 5 Squat x 10 Lt knee flexion 3# x 10 Sitting : LAQ 3# x 15 Sit to stand x 10 no UE assist  Supine: SAQ 3# x 10 Quad set x 10 Heel slide x 10  SLR:  5   01/07/23 Standing:  heel raises 10X  Toe raises 10X  Lt knee flexion 10X Ambulation 200 feet with SPC Sitting:  LAQ 10X5"  Sit to stand 10X no UE Supine:  heelslides 10X  Quad sets 10X5"  SLR unable AROM 110 degrees flexion  Evaluation 01/06/23 Sitting:   LAQ x 10 Sit to stand x 5 Supine: Heel slide x 10 Quad set x 10  SLR x 5   Rt side lying :  ABduction x 5  Prone: Knee flexion x 10    PATIENT EDUCATION:  Education details: HEp Person educated: Patient Education method: Chief Technology Officer Education comprehension: verbalized understanding and returned demonstration  HOME EXERCISE PROGRAM: Access Code: [email or web address] URL: [email or web address] Date: 01/06/2023 Prepared by: Virgina Organ  Exercises - Seated Long Arc Quad  - 3 x daily - 7 x weekly - 1 sets - 10 reps - 5" hold - Supine Quadricep Sets  - 3 x daily - 7 x weekly - 1 sets - 10 reps - 5" hold - Active Straight Leg Raise with Quad Set  - 2 x daily - 7 x weekly - 1 sets - 10 reps - 5" hold - Supine Heel Slide  - 2 x daily - 7 x weekly - 1 sets - 10 reps - 5" hold - Sit to Stand with Counter Support  - 3 x daily - 7 x weekly - 1 sets - 10 reps  ASSESSMENT:  CLINICAL IMPRESSION: Progress note today; improved strength; walking without her cane now but continues with antalgic and limited mobility; her back seems to limit her progress somewhat.  Patient will benefit from continued skilled therapy services  to address remaining unmet and partially met goals. ex  Evaluation:  Patient is a 62 y.o. female  who was seen today for physical therapy evaluation and treatment for s/p Lt arthroscopic surgery.  Evaluation demonstrates decreased ROM, decreased strength, decreased balance, decreased activity tolerance and increased pain. Ms. Maren Reamer will benefit from skilled PT to address these deficits and maximize her functional ability  OBJECTIVE IMPAIRMENTS: Abnormal gait, decreased activity tolerance, decreased balance, decreased mobility, difficulty walking, decreased ROM, increased edema, and pain.  ACTIVITY LIMITATIONS: carrying, lifting, bending, sitting, standing, stairs, and locomotion level  PARTICIPATION LIMITATIONS: cleaning, shopping, community activity, and occupation  PERSONAL FACTORS: Fitness are also affecting patient's functional outcome.   REHAB POTENTIAL: Good  CLINICAL DECISION MAKING: Stable/uncomplicated  EVALUATION COMPLEXITY: Low   GOALS: Goals reviewed with patient? Yes  SHORT TERM GOALS: Target date: 01/27/23 PT to be I in HEP in order to decrease her pain to no greater than a 4/10 Baseline: Goal status: IN PROGRESS  2.  PT core and LE strength increased to allow pt to be ambulating with a cane  Baseline:  Goal status: IN PROGRESS  3.  Pt ROM increased to 120 to be able to squat down.  Baseline: 115 left knee 9/18 Goal status: IN PROGRESS   LONG TERM GOALS: Target date: 03/26/2023  PT to be I in an advanced  HEP in order to decrease her pain to no greater than a 1/10 Baseline:  Goal status: IN PROGRESS   2.  PT core and LE strength increased to allow pt to be ambulating without an assistive device  Baseline:  Goal status: met   3.  PT to be able to go up and down 4 steps in a reciprocal manner  Baseline:  Goal status: IN PROGRESS   4.  PT to be able to be on her feet standing/walking for 2 hours at a time to be able to return to work  Baseline: has  not returned to work yet Goal status: IN PROGRESS   PLAN:  PT FREQUENCY: 2x/week  PT DURATION: 6 weeks  PLANNED INTERVENTIONS: Therapeutic exercises, Therapeutic activity, Gait training, Patient/Family education, Self Care, Joint mobilization, and Manual therapy  PLAN FOR NEXT SESSION: Progress WB strengthening as able, gait with LRAD.  Next session progress to bodycraft pulley machine for TKE's extend 2 week 4 to address remaining unmet and partially met goals.    2:22 PM, 02/26/23 Tiffnay Bossi Small Keygan Dumond MPT [facility] [place] [phone]

## 2023-03-05 ENCOUNTER — Ambulatory Visit (INDEPENDENT_AMBULATORY_CARE_PROVIDER_SITE_OTHER): Payer: 59 | Admitting: Family Medicine

## 2023-03-05 ENCOUNTER — Encounter: Payer: Self-pay | Admitting: Family Medicine

## 2023-03-05 VITALS — BP 119/76 | HR 80 | Ht 62.0 in | Wt 183.0 lb

## 2023-03-05 DIAGNOSIS — M549 Dorsalgia, unspecified: Secondary | ICD-10-CM

## 2023-03-05 DIAGNOSIS — G8929 Other chronic pain: Secondary | ICD-10-CM

## 2023-03-05 DIAGNOSIS — E038 Other specified hypothyroidism: Secondary | ICD-10-CM

## 2023-03-05 DIAGNOSIS — I10 Essential (primary) hypertension: Secondary | ICD-10-CM | POA: Diagnosis not present

## 2023-03-05 DIAGNOSIS — E559 Vitamin D deficiency, unspecified: Secondary | ICD-10-CM | POA: Diagnosis not present

## 2023-03-05 DIAGNOSIS — E7849 Other hyperlipidemia: Secondary | ICD-10-CM | POA: Diagnosis not present

## 2023-03-05 DIAGNOSIS — R7301 Impaired fasting glucose: Secondary | ICD-10-CM | POA: Diagnosis not present

## 2023-03-05 MED ORDER — PREDNISONE 20 MG PO TABS
40.0000 mg | ORAL_TABLET | Freq: Every day | ORAL | 0 refills | Status: AC
Start: 2023-03-05 — End: 2023-03-10

## 2023-03-05 NOTE — Patient Instructions (Addendum)
I appreciate the opportunity to provide care to you today!    Follow up:  4 months  Labs: please stop by the lab during the week to get your blood drawn (CBC, CMP, TSH, Lipid profile, HgA1c, Vit D)  Chronic low back pain -I have prescribed  prednisone 40 mg daily for 5 days.  -It's important to avoid taking ibuprofen products while on this medication, as it increases the risk for gastrointestinal ulcers and bleeding. -You may use over-the-counter Tylenol Arthritis for pain relief.  -I also recommend applying heat to the affected area for 10 to 15 minutes, 3-4 times daily. -Additionally, please avoid aggravating activities and incorporate strengthening and stretching exercises to help manage your symptoms.  Attached with your AVS, you will find valuable resources for self-education. I highly recommend dedicating some time to thoroughly examine them.   Please continue to a heart-healthy diet and increase your physical activities. Try to exercise for at least five days a week.    It was a pleasure to see you and I look forward to continuing to work together on your health and well-being. Please do not hesitate to call the office if you need care or have questions about your care.  In case of emergency, please visit the Emergency Department for urgent care, or contact our clinic at [phone] to schedule an appointment. We're here to help you!   Have a wonderful day and week. With Gratitude, Gilmore Laroche MSN, FNP-BC

## 2023-03-05 NOTE — Assessment & Plan Note (Signed)
Controlled Encouraged low-sodium diet with increased physical activity Encouraged to continue take amlodipine 5 mg daily BP Readings from Last 3 Encounters:  03/05/23 119/76  02/25/23 121/78  12/25/22 125/78

## 2023-03-05 NOTE — Assessment & Plan Note (Signed)
-  I have prescribed  prednisone 40 mg daily for 5 days.  -It's important to avoid taking ibuprofen products while on this medication, as it increases the risk for gastrointestinal ulcers and bleeding. -You may use over-the-counter Tylenol Arthritis for pain relief.  -I also recommend applying heat to the affected area for 10 to 15 minutes, 3-4 times daily. -Additionally, please avoid aggravating activities and incorporate strengthening and stretching exercises to help manage your symptoms.

## 2023-03-05 NOTE — Progress Notes (Signed)
Established Patient Office Visit  Subjective:  Patient ID: Jacqueline [name], female    DOB: 20-Oct-1960  Age: 62 y.o. MRN: [id number]  CC:  Chief Complaint  Patient presents with   Follow-up    Follow up backache seeing ortho next week    HPI Jacqueline [name] is a 62 y.o. female with past medical history of hypertension, hyperlipidemia, chronic back pain presents for f/u of  chronic medical conditions.  Chronic back pain: currently managing her symptoms with Cymbalta 30 mg daily, Robaxin 750 mg as needed (up to 4 times daily), and Voltaren 75 mg twice daily, but reports minimal relief. She rates her pain as 7 out of 10, noting it is constant and radiates down her lower extremities. The patient denies any bowel or bladder incontinence, fever, or unintentional weight loss. She is scheduled to follow up with orthopedics next week for further evaluation.  Hyperlipidemia: She takes atorvastatin 20 mg daily and reports treatment compliance  Hypertension: She takes amlodipine 5 mg daily, and reports treatment compliance   Past Medical History:  Diagnosis Date   Anemia    Arthritis    per patient, back   Complication of anesthesia 2018   per patient HR dropped during partial hysterectomy surgery   Fibroid uterus    GERD (gastroesophageal reflux disease)    H/O total hysterectomy    Heart murmur    Hyperlipidemia    Borderline   Hypertension    Numbness and tingling in right hand    Pre-diabetes    borderline   Sleep apnea     Past Surgical History:  Procedure Laterality Date   BACK SURGERY     BALLOON DILATION N/A 05/22/2021   Procedure: BALLOON DILATION;  Surgeon: Lanelle Bal, DO;  Location: [facility];  Service: Endoscopy;  Laterality: N/A;   BIOPSY  11/18/2022   Procedure: BIOPSY;  Surgeon: Corbin Ade, MD;  Location: [facility];  Service: Endoscopy;;   CHOLECYSTECTOMY     COLONOSCOPY  2003   Dr. Jena Gauss: Anal canal hemorrhoids   COLONOSCOPY N/A 08/25/2014    Normal colonoscopy.  Next colonoscopy in 2026.  Procedure: COLONOSCOPY;  Surgeon: Corbin Ade, MD;  Location: [facility];  Service: Endoscopy;  Laterality: N/A;  1200   ESOPHAGOGASTRODUODENOSCOPY N/A 08/25/2014   Procedure: ESOPHAGOGASTRODUODENOSCOPY (EGD);  Surgeon: Corbin Ade, MD;  Location: [facility];  Service: Endoscopy;  Laterality: N/A;   ESOPHAGOGASTRODUODENOSCOPY (EGD) WITH PROPOFOL N/A 05/22/2021   Web and distal esophagus, dilated, gastriti, biopsy positive for mildly active H. pylori gastritis.  s procedure: ESOPHAGOGASTRODUODENOSCOPY (EGD) WITH PROPOFOL;  Surgeon: Lanelle Bal, DO;  Location: [facility];  Service: Endoscopy;  Laterality: N/A;  9:15am   ESOPHAGOGASTRODUODENOSCOPY (EGD) WITH PROPOFOL N/A 11/18/2022   Procedure: ESOPHAGOGASTRODUODENOSCOPY (EGD) WITH PROPOFOL;  Surgeon: Corbin Ade, MD;  [facility]: [facility];  Service: Endoscopy;  Laterality: N/A;  2:15 pm, asa 2   KNEE ARTHROSCOPY WITH MEDIAL MENISECTOMY Left 12/25/2022   Procedure: KNEE ARTHROSCOPY WITH PARTIAL LATERAL MENISCECTOMY DEBRIDEMENT ACL;  Surgeon: Vickki Hearing, MD;  Location: [facility];  Service: Orthopedics;  Laterality: Left;   MALONEY DILATION N/A 11/18/2022   Procedure: Elease Hashimoto DILATION;  Surgeon: Corbin Ade, MD;  Location: [facility];  Service: Endoscopy;  Laterality: N/A;   SALPINGOOPHORECTOMY Bilateral 11/06/2016   Procedure: BILATERAL SALPINGO OOPHORECTOMY;  Surgeon: Lazaro Arms, MD;  Location: [facility];  Service: Gynecology;  Laterality: Bilateral;   SUPRACERVICAL  ABDOMINAL HYSTERECTOMY N/A 11/06/2016   Procedure: HYSTERECTOMY SUPRACERVICAL ABDOMINAL;  Surgeon: Lazaro Arms, MD;  Location: [facility];  Service: Gynecology;  Laterality: N/A;   TRANSFORAMINAL LUMBAR INTERBODY FUSION (TLIF) WITH PEDICLE SCREW FIXATION 2 LEVEL Left 07/28/2019   Procedure: LEFT-SIDED LUMBAR 4- 5, LUMBAR 5 -SACRUM1 TRANSFORAMINAL LUMBAR INTERBODY FUSION WITH INSTRUMENTATION AND ALLOGRAFT;   Surgeon: Estill Bamberg, MD;  Location: [facility];  Service: Orthopedics;  Laterality: Left;   TUBAL LIGATION      Family History  Problem Relation Age of Onset   Breast cancer Maternal Aunt    Hypertension Maternal Aunt    Cancer Maternal Aunt        Breast   Cancer Maternal Grandmother         lymphnodes   Colon cancer Neg Hx     Social History   Socioeconomic History   Marital status: Married    Spouse name: Not on file   Number of children: 3   Years of education: Not on file   Highest education level: Not on file  Occupational History   Occupation: PT at [employer]  Tobacco Use   Smoking status: Never   Smokeless tobacco: Never   Tobacco comments:    Never smoked  Vaping Use   Vaping status: Never Used  Substance and Sexual Activity   Alcohol use: No    Alcohol/week: 0.0 standard drinks of alcohol   Drug use: No   Sexual activity: Yes    Birth control/protection: Post-menopausal, Surgical  Other Topics Concern   Not on file  Social History Narrative   Lives with her spouse, work part at [employer]    Social Determinants of Health   Financial Resource Strain: Not on file  Food Insecurity: Not on file  Transportation Needs: Not on file  Physical Activity: Not on file  Stress: Not on file  Social Connections: Not on file  Intimate Partner Violence: Not on file    Outpatient Medications Prior to Visit  Medication Sig Dispense Refill   acyclovir (ZOVIRAX) 400 MG tablet Take 400 mg by mouth 3 (three) times daily as needed (fever blisters).     amLODipine (NORVASC) 5 MG tablet Take 1 tablet by mouth once daily 90 tablet 0   atorvastatin (LIPITOR) 20 MG tablet Take 1 tablet by mouth once daily 90 tablet 0   azelastine (ASTELIN) 0.1 % nasal spray Place 1 spray into both nostrils 2 (two) times daily as needed for rhinitis. Use in each nostril as directed     diclofenac (VOLTAREN) 75 MG EC tablet TAKE 1 TABLET BY MOUTH TWICE DAILY WITH A MEAL 60 tablet 5   DULoxetine  (CYMBALTA) 30 MG capsule Take 1 capsule (30 mg total) by mouth daily. 90 capsule 0   estradiol (ESTRACE) 0.1 MG/GM vaginal cream INSERT 2 GRAMS VAGINALLY AS DIRECTED EVERY OTHER NIGHT 43 g 11   fluticasone (FLONASE) 50 MCG/ACT nasal spray Place 1 spray into both nostrils daily. (Patient taking differently: Place 1 spray into both nostrils daily as needed for allergies.) 16 g 6   ibuprofen (ADVIL) 800 MG tablet Take 1 tablet (800 mg total) by mouth every 8 (eight) hours as needed. 90 tablet 1   methocarbamol (ROBAXIN-750) 750 MG tablet Take 1 tablet (750 mg total) by mouth 4 (four) times daily. (Patient taking differently: Take 750 mg by mouth every 6 (six) hours as needed for muscle spasms.) 30 tablet 0   Multiple Vitamin (MULTIVITAMIN WITH MINERALS) TABS tablet Take  1 tablet by mouth daily with supper. One-A-Day Multivitamin     pantoprazole (PROTONIX) 40 MG tablet Take 1 tablet (40 mg total) by mouth 2 (two) times daily before a meal. (Patient taking differently: Take 40 mg by mouth daily.) 180 tablet 0   pantoprazole (PROTONIX) 40 MG tablet Take 1 tablet (40 mg total) by mouth 2 (two) times daily before a meal. For 14 days while taking antibiotics for H.pylori, then resume to once daily. 60 tablet 3   traZODone (DESYREL) 50 MG tablet Take 0.5-1 tablets (25-50 mg total) by mouth at bedtime as needed for sleep. 30 tablet 3   No facility-administered medications prior to visit.    No Known Allergies  ROS Review of Systems  Constitutional:  Negative for chills and fever.  Eyes:  Negative for visual disturbance.  Respiratory:  Negative for chest tightness and shortness of breath.   Musculoskeletal:  Positive for back pain.  Neurological:  Negative for dizziness and headaches.      Objective:    Physical Exam HENT:     Head: Normocephalic.     Mouth/Throat:     Mouth: Mucous membranes are moist.  Cardiovascular:     Rate and Rhythm: Normal rate.     Heart sounds: Normal heart sounds.   Pulmonary:     Effort: Pulmonary effort is normal.     Breath sounds: Normal breath sounds.  Musculoskeletal:     Lumbar back: Tenderness present.  Neurological:     Mental Status: She is alert.     BP 119/76 (BP Location: Right Arm, Patient Position: Sitting, Cuff Size: Large)   Pulse 80   Ht 5\' 2"  (1.575 m)   Wt 183 lb (83 kg)   LMP 10/26/2016 (Exact Date)   SpO2 94%   BMI 33.47 kg/m  Wt Readings from Last 3 Encounters:  03/05/23 183 lb (83 kg)  02/25/23 180 lb 6.4 oz (81.8 kg)  12/25/22 181 lb (82.1 kg)    Lab Results  Component Value Date   TSH 1.060 12/11/2022   Lab Results  Component Value Date   WBC 4.9 12/11/2022   HGB 12.7 12/11/2022   HCT 36.5 12/11/2022   MCV 90 12/11/2022   PLT 233 12/11/2022   Lab Results  Component Value Date   NA 136 12/24/2022   K 3.4 (L) 12/24/2022   CO2 25 12/24/2022   GLUCOSE 160 (H) 12/24/2022   BUN 14 12/24/2022   CREATININE 1.08 (H) 12/24/2022   BILITOT 0.9 09/06/2022   ALKPHOS 112 09/06/2022   AST 40 09/06/2022   ALT 47 (H) 09/06/2022   PROT 7.3 09/06/2022   ALBUMIN 4.3 09/06/2022   CALCIUM 9.2 12/24/2022   ANIONGAP 8 12/24/2022   EGFR 60 09/06/2022   Lab Results  Component Value Date   CHOL 167 12/11/2022   Lab Results  Component Value Date   HDL 55 12/11/2022   Lab Results  Component Value Date   LDLCALC 95 12/11/2022   Lab Results  Component Value Date   TRIG 94 12/11/2022   Lab Results  Component Value Date   CHOLHDL 3.0 12/11/2022   Lab Results  Component Value Date   HGBA1C 6.1 (H) 12/11/2022      Assessment & Plan:  Primary hypertension Assessment & Plan: Controlled Encouraged low-sodium diet with increased physical activity Encouraged to continue take amlodipine 5 mg daily BP Readings from Last 3 Encounters:  03/05/23 119/76  02/25/23 121/78  12/25/22 125/78  Other hyperlipidemia Assessment & Plan: Encouraged reducing her intake of greasy, fatty, starchy foods with  increased activity Encouraged to continue taking Lipitor 20 mg daily Lab Results  Component Value Date   CHOL 167 12/11/2022   HDL 55 12/11/2022   LDLCALC 95 12/11/2022   TRIG 94 12/11/2022   CHOLHDL 3.0 12/11/2022     Orders: -     Lipid panel -     CMP14+EGFR -     CBC with Differential/Platelet  Other chronic back pain Assessment & Plan: -I have prescribed  prednisone 40 mg daily for 5 days.  -It's important to avoid taking ibuprofen products while on this medication, as it increases the risk for gastrointestinal ulcers and bleeding. -You may use over-the-counter Tylenol Arthritis for pain relief.  -I also recommend applying heat to the affected area for 10 to 15 minutes, 3-4 times daily. -Additionally, please avoid aggravating activities and incorporate strengthening and stretching exercises to help manage your symptoms.  Orders: -     predniSONE; Take 2 tablets (40 mg total) by mouth daily for 5 days.  Dispense: 10 tablet; Refill: 0  IFG (impaired fasting glucose) -     Hemoglobin A1c  Vitamin D deficiency -     VITAMIN D 25 Hydroxy (Vit-D Deficiency, Fractures)  Other specified hypothyroidism -     TSH + free T4  Note: This chart has been completed using Engineer, civil (consulting) software, and while attempts have been made to ensure accuracy, certain words and phrases may not be transcribed as intended.    Follow-up: Return in about 4 months (around 07/05/2023).   Gilmore Laroche, FNP

## 2023-03-05 NOTE — Assessment & Plan Note (Signed)
Encouraged reducing her intake of greasy, fatty, starchy foods with increased activity Encouraged to continue taking Lipitor 20 mg daily Lab Results  Component Value Date   CHOL 167 12/11/2022   HDL 55 12/11/2022   LDLCALC 95 12/11/2022   TRIG 94 12/11/2022   CHOLHDL 3.0 12/11/2022

## 2023-03-06 ENCOUNTER — Ambulatory Visit (HOSPITAL_COMMUNITY): Payer: 59 | Admitting: Physical Therapy

## 2023-03-06 DIAGNOSIS — M6281 Muscle weakness (generalized): Secondary | ICD-10-CM | POA: Diagnosis not present

## 2023-03-06 DIAGNOSIS — E559 Vitamin D deficiency, unspecified: Secondary | ICD-10-CM | POA: Diagnosis not present

## 2023-03-06 DIAGNOSIS — M25662 Stiffness of left knee, not elsewhere classified: Secondary | ICD-10-CM

## 2023-03-06 DIAGNOSIS — E038 Other specified hypothyroidism: Secondary | ICD-10-CM | POA: Diagnosis not present

## 2023-03-06 DIAGNOSIS — E7849 Other hyperlipidemia: Secondary | ICD-10-CM | POA: Diagnosis not present

## 2023-03-06 DIAGNOSIS — R7301 Impaired fasting glucose: Secondary | ICD-10-CM | POA: Diagnosis not present

## 2023-03-06 DIAGNOSIS — M25562 Pain in left knee: Secondary | ICD-10-CM

## 2023-03-06 NOTE — Therapy (Signed)
OUTPATIENT PHYSICAL THERAPY TREATMENT/ PROGRESS NOT   Patient Name: Jacqueline [name] MRN: [id number] DOB:1960/12/16, 62 y.o., female Today's Date: 03/06/2023  END OF SESSION:  PT End of Session - 03/06/23 0721     Visit Number 8    Number of Visits 20    Date for PT Re-Evaluation 03/26/23    Authorization Type [employer]    PT Start Time 0720    PT Stop Time 0800    PT Time Calculation (min) 40 min    Activity Tolerance Patient tolerated treatment well    Behavior During Therapy [facility] for tasks assessed/performed              Past Medical History:  Diagnosis Date   Anemia    Arthritis    per patient, back   Complication of anesthesia 2018   per patient HR dropped during partial hysterectomy surgery   Fibroid uterus    GERD (gastroesophageal reflux disease)    H/O total hysterectomy    Heart murmur    Hyperlipidemia    Borderline   Hypertension    Numbness and tingling in right hand    Pre-diabetes    borderline   Sleep apnea    Past Surgical History:  Procedure Laterality Date   BACK SURGERY     BALLOON DILATION N/A 05/22/2021   Procedure: BALLOON DILATION;  Surgeon: Lanelle Bal, DO;  [facility];  Service: Endoscopy;  Laterality: N/A;   BIOPSY  11/18/2022   Procedure: BIOPSY;  Surgeon: Corbin Ade, MD;  [facility];  Service: Endoscopy;;   CHOLECYSTECTOMY     COLONOSCOPY  2003   Dr. Jena Gauss: Anal canal hemorrhoids   COLONOSCOPY N/A 08/25/2014   Normal colonoscopy.  Next colonoscopy in 2026.  Procedure: COLONOSCOPY;  Surgeon: Corbin Ade, MD;  [facility];  Service: Endoscopy;  Laterality: N/A;  1200   ESOPHAGOGASTRODUODENOSCOPY N/A 08/25/2014   Procedure: ESOPHAGOGASTRODUODENOSCOPY (EGD);  Surgeon: Corbin Ade, MD;  [facility];  Service: Endoscopy;  Laterality: N/A;   ESOPHAGOGASTRODUODENOSCOPY (EGD) WITH PROPOFOL N/A 05/22/2021   Web and distal esophagus, dilated, gastriti, biopsy positive  for mildly active H. pylori gastritis.  s procedure: ESOPHAGOGASTRODUODENOSCOPY (EGD) WITH PROPOFOL;  Surgeon: Lanelle Bal, DO;  [facility];  Service: Endoscopy;  Laterality: N/A;  9:15am   ESOPHAGOGASTRODUODENOSCOPY (EGD) WITH PROPOFOL N/A 11/18/2022   Procedure: ESOPHAGOGASTRODUODENOSCOPY (EGD) WITH PROPOFOL;  Surgeon: Corbin Ade, MD;  [facility];  Service: Endoscopy;  Laterality: N/A;  2:15 pm, asa 2   KNEE ARTHROSCOPY WITH MEDIAL MENISECTOMY Left 12/25/2022   Procedure: KNEE ARTHROSCOPY WITH PARTIAL LATERAL MENISCECTOMY DEBRIDEMENT ACL;  Surgeon: Vickki Hearing, MD;  Location: [facility];  Service: Orthopedics;  Laterality: Left;   MALONEY DILATION N/A 11/18/2022   Procedure: Elease Hashimoto DILATION;  Surgeon: Corbin Ade, MD;  [facility];  Service: Endoscopy;  Laterality: N/A;   SALPINGOOPHORECTOMY Bilateral 11/06/2016   Procedure: BILATERAL SALPINGO OOPHORECTOMY;  Surgeon: Lazaro Arms, MD;  Location: [facility];  Service: Gynecology;  Laterality: Bilateral;   SUPRACERVICAL ABDOMINAL HYSTERECTOMY N/A 11/06/2016   Procedure: HYSTERECTOMY SUPRACERVICAL ABDOMINAL;  Surgeon: Lazaro Arms, MD;  Location: [facility];  Service: Gynecology;  Laterality: N/A;   TRANSFORAMINAL LUMBAR INTERBODY FUSION (TLIF) WITH PEDICLE SCREW FIXATION 2 LEVEL Left 07/28/2019   Procedure: LEFT-SIDED LUMBAR 4- 5, LUMBAR 5 -SACRUM1 TRANSFORAMINAL LUMBAR INTERBODY FUSION WITH INSTRUMENTATION AND ALLOGRAFT;  Surgeon: Estill Bamberg,  MD;  Location: [facility];  Service: Orthopedics;  Laterality: Left;   TUBAL LIGATION     Patient Active Problem List   Diagnosis Date Noted   S/P arthroscopy of left knee debridement of ACL/ lateral meniscectomy 12/25/22 01/02/2023   Bucket-handle tear of lateral meniscus of left knee as current injury 12/25/2022   Left anterior cruciate ligament tear 12/25/2022   Acute non-recurrent maxillary sinusitis 10/26/2022   Mucopurulent conjunctivitis of both eyes  10/26/2022   Acute bronchitis 10/25/2022   Maxillary sinusitis, acute 10/25/2022   Conjunctivitis 10/25/2022   Neck pain, chronic 08/28/2022   Sleep apnea 08/28/2022   Need for immunization against influenza 02/28/2022   Screening for cervical cancer 12/21/2021   H. pylori infection 12/05/2021   Snoring 11/23/2021   Insomnia 11/23/2021   Annual physical exam 10/16/2021   Dermatitis 10/16/2021   Need for varicella vaccine 10/16/2021   Diarrhea 07/05/2021   Stomach cramps 07/05/2021   Nausea & vomiting 07/05/2021   Encounter for vaccination 07/05/2021   Right foot pain 05/16/2021   Dysphagia 05/01/2021   Right shoulder pain 12/27/2020   Rash 12/13/2020   Chronic back pain 04/04/2020   History of back surgery 04/04/2020   Radiculopathy 07/28/2019   Borderline diabetes 12/25/2018   Hypertension 06/30/2018   S/P abdominal supracervical subtotal hysterectomy 11/06/2016   Trigger middle finger of right hand 04/24/2016   GERD (gastroesophageal reflux disease) 08/04/2014   Shoulder strain 07/26/2014   Degenerative arthritis of thumb 07/26/2014   Left knee pain 02/28/2014   Hip pain 04/15/2013   Epicondylitis, lateral (tennis elbow) 04/13/2013   Constipation 09/15/2012   Hyperlipidemia 11/28/2011   Obesity, Class I, BMI 30.0-34.9 (see actual BMI) 09/15/2011   ALLERGIC RHINITIS, SEASONAL 11/13/2007    PCP: Gilmore Laroche  REFERRING PROVIDER: Vickki Hearing, MD  REFERRING DIAG: Diagnosis [id number] (ICD-10-CM) - S/P arthroscopy of left knee  THERAPY DIAG:  Left knee pain Muscle weakness Difficulty in walking  Rationale for Evaluation and Treatment: Rehabilitation  ONSET DATE: 12/25/22  Returns to MD 9/30  SUBJECTIVE STATEMENT:  Pt still wearing her brace, returns to MD next week and hoping to discontinue it.  No pain currently just soreness.  Evaluation: Jacqueline [name] states that she had arthroscopic surgery on her knee on 12/25/22.  Pt states that she has been bending  and trying to lift her leg up but it is sore.   PT is unable to states how long she can stand, walk or sit for states she just doesn't know.   *Nov 2017 originally fell on concrete at work and injured lower back  PERTINENT HISTORY: No chief complaint on file.   DOS: 12/25/22   POST OP PLAN  WB as tolerated SUTURES OUT IN A WEEK  AROM  BRACE playmaker brace for 6 weeks and PT for 6 weeks to emphasize quadricep strengthening and overall leg conditioning   Prognosis guarded.  Expect lateral compartment degeneration and chondrosis        PRE-OPERATIVE DIAGNOSIS:  Torn medical meniscus left knee   POST-OPERATIVE DIAGNOSIS:  torn lateral meniscus and partial tear acl    PROCEDURE:  Procedure(s): LEFT KNEE ARTHROSCOPY WITH PARTIAL LATERAL MEDIAL MENISCECTOMY AND DEBRIDEMENT OF ACL        PAIN:  Are you having pain? Yes: NPRS scale: 2/10 Pain location: left knee Pain description: sore Aggravating factors: WB Relieving factors: elevating and putting ice on it.    PRECAUTIONS: BRACE playmaker brace for 6 weeks and PT for 6 weeks  to emphasize quadricep strengthening and overall leg conditioning     WEIGHT BEARING RESTRICTIONS:  WBAT FALLS:  Has patient fallen in last 6 months? 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)  PLOF: Independent  PATIENT GOALS: walk better  NEXT MD VISIT: after therapy  OBJECTIVE:   COGNITION: Overall cognitive status: Within functional limits for tasks assessed      EDEMA:  Normal for post op   MUSCLE LENGTH: Hamstrings: Right 140 deg; Left 135 deg  PALPATION: tender  LOWER EXTREMITY ROM:  Active ROM Right eval Left eval left Left 01/20/23 Left 02/26/23  Hip flexion       Hip extension       Hip abduction        Hip adduction       Hip internal rotation       Hip external rotation       Knee flexion  105 115 123 115  Knee extension  3 2 2  -5  Ankle dorsiflexion       Ankle plantarflexion       Ankle inversion       Ankle eversion        (Blank rows = not tested)  LOWER EXTREMITY MMT:  MMT Right eval Left eval Left 02/26/23  Hip flexion  2 3+  Hip extension     Hip abduction  3-   Hip adduction     Hip internal rotation     Hip external rotation     Knee flexion  3+ 4+  Knee extension  3 4+  Ankle dorsiflexion   5  Ankle plantarflexion     Ankle inversion     Ankle eversion      (Blank rows = not tested)    FUNCTIONAL TESTS: (with knee brace) 2 minute walk test: 322 with knee brace and rollaltor Single leg stance:  21" on RT; 3" on LT  Sit to stand:  5x  with UE assist: 44 seconds     TODAY'S TREATMENT:                                                                                                                              DATE:  03/06/23 Bike seat 8; 5 minutes Standing:  heel raises 20X  Squats 20X ea  Lunges onto 4" step 20X no UE assist  4" step up forward 2X10   4" lateral step up 2X10 Hip abduction bilaterally 20X each  Hip extension bilaterally 20X each Sit to stands no UE 10X   02/26/23 Progress note 2 MWT 272 ft with knee brace and no AD SLS 7" on left MMT's and AROM see above 5 x sit to stand 30.06 no UE assist (  back pain) 4" steps with bilat UE assist reciprocal pattern  02/11/23: Standing:  Heel raise on incline x 20  Toe raises declined slope 20x Squat x 20  Lunge onto 4" step 20X each min UE assist  Forward step ups 4" 20X UE assist bilaterally  Lateral step ups 4" 20X UE assist bilaterally  Hip abduction bilaterally 20X each  Hip extension bilaterally 20X each Sit to stand 15x eccentric control no HHA  01/31/23: Heel raise x 15 Squat x 15 Toe raises declined slope 15x TKE GTB 10x 5" Sit to stand 15x eccentric control no HHA Lunge onto  4in step 15x Forward step up 4in 15 Lateral step up 4in 15x   01/20/23  Gait without AD x 226 ft.   Heel raise x 15 Squat x 15 Lt knee flexion 3# x 10 Lunge onto 4" box x 15 Step up 4" x 10   Lateral step up x 10  Wall squat x 10  Sit to stand x 15 no UE assist  Supine:   SLR x 10  Q-Set x 10 bridge x 15 Bike x 5 minutes   01/15/23 Heel raises x 15 Terminal extension x 10 Lunging onto 12" box x 5 Squat x 10 Lt knee flexion 3# x 10 Sitting : LAQ 3# x 15 Sit to stand x 10 no UE assist  Supine: SAQ 3# x 10 Quad set x 10 Heel slide x 10  SLR:  5   01/07/23 Standing:  heel raises 10X  Toe raises 10X  Lt knee flexion 10X Ambulation 200 feet with SPC Sitting:  LAQ 10X5"  Sit to stand 10X no UE Supine:  heelslides 10X  Quad sets 10X5"  SLR unable AROM 110 degrees flexion  Evaluation 01/06/23 Sitting:   LAQ x 10 Sit to stand x 5 Supine: Heel slide x 10 Quad set x 10  SLR x 5   Rt side lying :  ABduction x 5  Prone: Knee flexion x 10    PATIENT EDUCATION:  Education details: HEp Person educated: Patient Education method: Chief Technology Officer Education comprehension: verbalized understanding and returned demonstration  HOME EXERCISE PROGRAM: Access Code: [email or web address] URL: [email or web address] Date: 01/06/2023 Prepared by: Virgina Organ  Exercises - Seated Long Arc Quad  - 3 x daily - 7 x weekly - 1 sets - 10 reps - 5" hold - Supine Quadricep Sets  - 3 x daily - 7 x weekly - 1 sets - 10 reps - 5" hold - Active Straight Leg Raise with Quad Set  - 2 x daily - 7 x weekly - 1 sets - 10 reps - 5" hold - Supine Heel Slide  - 2 x daily - 7 x weekly - 1 sets - 10 reps - 5" hold - Sit to Stand with Counter Support  - 3 x daily - 7 x weekly - 1 sets - 10 reps  ASSESSMENT:  CLINICAL IMPRESSION: Continued focus on strengthening of LE's.  Pt with slight antalgia upon initial mobilization but then straightens out. Able to complete lunges and  squats without UE assist today.  No pain or issues voiced during or at completion of session today.  Patient will continue to  benefit from skilled therapy services to address remaining unmet and partially met goals.   Evaluation:  Patient is a 62 y.o. female  who was seen today for physical therapy evaluation and treatment for s/p Lt arthroscopic surgery.  Evaluation demonstrates decreased ROM, decreased strength, decreased balance,  decreased activity tolerance and increased pain. Ms. Maren Reamer will benefit from skilled PT to address these deficits and maximize her functional ability  OBJECTIVE IMPAIRMENTS: Abnormal gait, decreased activity tolerance, decreased balance, decreased mobility, difficulty walking, decreased ROM, increased edema, and pain.   ACTIVITY LIMITATIONS: carrying, lifting, bending, sitting, standing, stairs, and locomotion level  PARTICIPATION LIMITATIONS: cleaning, shopping, community activity, and occupation  PERSONAL FACTORS: Fitness are also affecting patient's functional outcome.   REHAB POTENTIAL: Good  CLINICAL DECISION MAKING: Stable/uncomplicated  EVALUATION COMPLEXITY: Low   GOALS: Goals reviewed with patient? Yes  SHORT TERM GOALS: Target date: 01/27/23 PT to be I in HEP in order to decrease her pain to no greater than a 4/10 Baseline: Goal status: IN PROGRESS  2.  PT core and LE strength increased to allow pt to be ambulating with a cane  Baseline:  Goal status: IN PROGRESS  3.  Pt ROM increased to 120 to be able to squat down.  Baseline: 115 left knee 9/18 Goal status: IN PROGRESS   LONG TERM GOALS: Target date: 03/26/2023  PT to be I in an advanced  HEP in order to decrease her pain to no greater than a 1/10 Baseline:  Goal status: IN PROGRESS   2.  PT core and LE strength increased to allow pt to be ambulating without an assistive device  Baseline:  Goal status: met   3.  PT to be able to go up and down 4 steps in a reciprocal manner   Baseline:  Goal status: IN PROGRESS   4.  PT to be able to be on her feet standing/walking for 2 hours at a time to be able to return to work  Baseline: has not returned to work yet Goal status: IN PROGRESS   PLAN:  PT FREQUENCY: 2x/week  PT DURATION: 6 weeks  PLANNED INTERVENTIONS: Therapeutic exercises, Therapeutic activity, Gait training, Patient/Family education, Self Care, Joint mobilization, and Manual therapy  PLAN FOR NEXT SESSION: Progress WB strengthening as able, gait with LRAD.  Next session progress to bodycraft pulley machine for TKE's   7:21 AM, 03/06/23 Lurena Nida, PTA/CLT [facility] Ph: [phone]

## 2023-03-07 ENCOUNTER — Other Ambulatory Visit (INDEPENDENT_AMBULATORY_CARE_PROVIDER_SITE_OTHER): Payer: Self-pay

## 2023-03-07 ENCOUNTER — Telehealth: Payer: Self-pay | Admitting: Orthopedic Surgery

## 2023-03-07 ENCOUNTER — Encounter: Payer: Self-pay | Admitting: Orthopedic Surgery

## 2023-03-07 ENCOUNTER — Ambulatory Visit (INDEPENDENT_AMBULATORY_CARE_PROVIDER_SITE_OTHER): Payer: 59 | Admitting: Orthopedic Surgery

## 2023-03-07 ENCOUNTER — Other Ambulatory Visit: Payer: Self-pay | Admitting: Radiology

## 2023-03-07 DIAGNOSIS — M25552 Pain in left hip: Secondary | ICD-10-CM

## 2023-03-07 DIAGNOSIS — M25551 Pain in right hip: Secondary | ICD-10-CM

## 2023-03-07 LAB — CMP14+EGFR
ALT: 221 [IU]/L — ABNORMAL HIGH (ref 0–32)
AST: 130 [IU]/L — ABNORMAL HIGH (ref 0–40)
Albumin: 4 g/dL (ref 3.8–4.9)
Alkaline Phosphatase: 147 [IU]/L — ABNORMAL HIGH (ref 44–121)
BUN/Creatinine Ratio: 11 — ABNORMAL LOW (ref 12–28)
BUN: 10 mg/dL (ref 8–27)
Bilirubin Total: 0.5 mg/dL (ref 0.0–1.2)
CO2: 23 mmol/L (ref 20–29)
Calcium: 9.2 mg/dL (ref 8.7–10.3)
Chloride: 103 mmol/L (ref 96–106)
Creatinine, Ser: 0.95 mg/dL (ref 0.57–1.00)
Globulin, Total: 2.8 g/dL (ref 1.5–4.5)
Glucose: 94 mg/dL (ref 70–99)
Potassium: 3.9 mmol/L (ref 3.5–5.2)
Sodium: 139 mmol/L (ref 134–144)
Total Protein: 6.8 g/dL (ref 6.0–8.5)
eGFR: 69 mL/min/{1.73_m2} (ref >59)

## 2023-03-07 LAB — CBC WITH DIFFERENTIAL/PLATELET
Basophils Absolute: 0.1 10*3/uL (ref 0.0–0.2)
Basos: 1 %
EOS (ABSOLUTE): 0.4 10*3/uL (ref 0.0–0.4)
Eos: 8 %
Hematocrit: 36 % (ref 34.0–46.6)
Hemoglobin: 12 g/dL (ref 11.1–15.9)
Immature Grans (Abs): 0 10*3/uL (ref 0.0–0.1)
Immature Granulocytes: 0 %
Lymphocytes Absolute: 2 10*3/uL (ref 0.7–3.1)
Lymphs: 40 %
MCH: 31.4 pg (ref 26.6–33.0)
MCHC: 33.3 g/dL (ref 31.5–35.7)
MCV: 94 fL (ref 79–97)
Monocytes Absolute: 0.5 10*3/uL (ref 0.1–0.9)
Monocytes: 9 %
Neutrophils Absolute: 2 10*3/uL (ref 1.4–7.0)
Neutrophils: 42 %
Platelets: 197 10*3/uL (ref 150–450)
RBC: 3.82 x10E6/uL (ref 3.77–5.28)
RDW: 13.3 % (ref 11.7–15.4)
WBC: 4.9 10*3/uL (ref 3.4–10.8)

## 2023-03-07 LAB — LIPID PANEL
Chol/HDL Ratio: 2.8 {ratio} (ref 0.0–4.4)
Cholesterol, Total: 145 mg/dL (ref 100–199)
HDL: 52 mg/dL (ref >39)
LDL Chol Calc (NIH): 79 mg/dL (ref 0–99)
Triglycerides: 72 mg/dL (ref 0–149)
VLDL Cholesterol Cal: 14 mg/dL (ref 5–40)

## 2023-03-07 LAB — VITAMIN D 25 HYDROXY (VIT D DEFICIENCY, FRACTURES): Vit D, 25-Hydroxy: 61.2 ng/mL (ref 30.0–100.0)

## 2023-03-07 LAB — TSH+FREE T4
Free T4: 1.26 ng/dL (ref 0.82–1.77)
TSH: 0.973 u[IU]/mL (ref 0.450–4.500)

## 2023-03-07 LAB — HEMOGLOBIN A1C
Est. average glucose Bld gHb Est-mCnc: 120 mg/dL
Hgb A1c MFr Bld: 5.8 % — ABNORMAL HIGH (ref 4.8–5.6)

## 2023-03-07 MED ORDER — DICLOFENAC SODIUM 1 % EX GEL: 4.0000 g | Freq: Four times a day (QID) | CUTANEOUS | 1 refills | Status: AC

## 2023-03-07 NOTE — Progress Notes (Signed)
Post op   Encounter Diagnosis  Name Primary?   Hip pain, bilateral Yes    Chief Complaint  Patient presents with   Back Pain    Into legs / hip s has lumbar fusion, is supposed to have PT for back but on hold due to leg pain    Physical Exam Vitals and nursing note reviewed.  Constitutional:      Appearance: Normal appearance.  HENT:     Head: Normocephalic and atraumatic.  Eyes:     General: No scleral icterus.       Right eye: No discharge.        Left eye: No discharge.     Extraocular Movements: Extraocular movements intact.     Conjunctiva/sclera: Conjunctivae normal.     Pupils: Pupils are equal, round, and reactive to light.  Cardiovascular:     Rate and Rhythm: Normal rate.     Pulses: Normal pulses.  Skin:    General: Skin is warm and dry.     Capillary Refill: Capillary refill takes less than 2 seconds.  Neurological:     General: No focal deficit present.     Mental Status: She is alert and oriented to person, place, and time.  Psychiatric:        Mood and Affect: Mood normal.        Behavior: Behavior normal.        Thought Content: Thought content normal.        Judgment: Judgment normal.    Right Hip Exam  Right hip exam is normal.   Range of Motion  The patient has normal right hip ROM.  Muscle Strength  The patient has normal right hip strength.   Left Hip Exam  Left hip exam is normal.  Range of Motion  The patient has normal left hip ROM.  Muscle Strength  The patient has normal left hip strength.       Xrays pelvis : normal xrays both hips   Encounter Diagnosis  Name Primary?   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

## 2023-03-07 NOTE — Telephone Encounter (Signed)
Patient requesting Voltaren gel to [facility] I told her they may jut tell her to get it otc but she wants sent in.

## 2023-03-07 NOTE — Patient Instructions (Signed)
CANCEL appointment Monday

## 2023-03-10 ENCOUNTER — Encounter: Payer: 59 | Admitting: Orthopedic Surgery

## 2023-03-11 ENCOUNTER — Other Ambulatory Visit: Payer: Self-pay | Admitting: Family Medicine

## 2023-03-11 DIAGNOSIS — Z419 Encounter for procedure for purposes other than remedying health state, unspecified: Secondary | ICD-10-CM | POA: Diagnosis not present

## 2023-03-11 DIAGNOSIS — E7849 Other hyperlipidemia: Secondary | ICD-10-CM

## 2023-03-11 DIAGNOSIS — R748 Abnormal levels of other serum enzymes: Secondary | ICD-10-CM

## 2023-03-11 MED ORDER — EZETIMIBE 10 MG PO TABS: 10.0000 mg | ORAL_TABLET | Freq: Every day | ORAL | 3 refills | Status: DC

## 2023-03-11 NOTE — Progress Notes (Signed)
  Please inform the patient that her labs indicate she is prediabetic, and I recommend that she continues to decrease her intake of high-sugar foods and beverages while increasing her physical activity. Additionally, her liver enzymes are elevated, so I recommend returning for labs to assess her liver function.  I advise decreasing her intake of Tylenol and alcohol, if applicable. I have also discontinued atorvastatin 20 mg daily, and the prescription for Zetia 10 mg daily has been sent to her pharmacy. We will reconsider statin therapy once her liver enzymes are within stable limits.

## 2023-03-12 ENCOUNTER — Ambulatory Visit (HOSPITAL_COMMUNITY): Payer: 59 | Attending: Internal Medicine | Admitting: Physical Therapy

## 2023-03-12 ENCOUNTER — Telehealth: Payer: Self-pay | Admitting: Orthopedic Surgery

## 2023-03-12 DIAGNOSIS — M25662 Stiffness of left knee, not elsewhere classified: Secondary | ICD-10-CM | POA: Diagnosis not present

## 2023-03-12 DIAGNOSIS — M5459 Other low back pain: Secondary | ICD-10-CM | POA: Insufficient documentation

## 2023-03-12 DIAGNOSIS — M25562 Pain in left knee: Secondary | ICD-10-CM | POA: Insufficient documentation

## 2023-03-12 DIAGNOSIS — M6281 Muscle weakness (generalized): Secondary | ICD-10-CM | POA: Diagnosis not present

## 2023-03-12 NOTE — Therapy (Signed)
OUTPATIENT PHYSICAL THERAPY TREATMENT  Patient Name: Jacqueline [name] MRN: [id number] DOB:02-25-1961, 62 y.o., female Today's Date: 03/12/2023  END OF SESSION:  PT End of Session - 03/12/23 0857     Visit Number 9    Number of Visits 20    Date for PT Re-Evaluation 03/26/23    Authorization Type [employer]    PT Start Time [phone]    PT Stop Time 0934    PT Time Calculation (min) 43 min    Activity Tolerance Patient tolerated treatment well    Behavior During Therapy [facility] for tasks assessed/performed              Past Medical History:  Diagnosis Date   Anemia    Arthritis    per patient, back   Complication of anesthesia 2018   per patient HR dropped during partial hysterectomy surgery   Fibroid uterus    GERD (gastroesophageal reflux disease)    H/O total hysterectomy    Heart murmur    Hyperlipidemia    Borderline   Hypertension    Numbness and tingling in right hand    Pre-diabetes    borderline   Sleep apnea    Past Surgical History:  Procedure Laterality Date   BACK SURGERY     BALLOON DILATION N/A 05/22/2021   Procedure: BALLOON DILATION;  Surgeon: Lanelle Bal, DO;  [facility];  Service: Endoscopy;  Laterality: N/A;   BIOPSY  11/18/2022   Procedure: BIOPSY;  Surgeon: Corbin Ade, MD;  [facility];  Service: Endoscopy;;   CHOLECYSTECTOMY     COLONOSCOPY  2003   Dr. Jena Gauss: Anal canal hemorrhoids   COLONOSCOPY N/A 08/25/2014   Normal colonoscopy.  Next colonoscopy in 2026.  Procedure: COLONOSCOPY;  Surgeon: Corbin Ade, MD;  [facility];  Service: Endoscopy;  Laterality: N/A;  1200   ESOPHAGOGASTRODUODENOSCOPY N/A 08/25/2014   Procedure: ESOPHAGOGASTRODUODENOSCOPY (EGD);  Surgeon: Corbin Ade, MD;  [facility];  Service: Endoscopy;  Laterality: N/A;   ESOPHAGOGASTRODUODENOSCOPY (EGD) WITH PROPOFOL N/A 05/22/2021   Web and distal esophagus, dilated, gastriti, biopsy positive for mildly active  H. pylori gastritis.  s procedure: ESOPHAGOGASTRODUODENOSCOPY (EGD) WITH PROPOFOL;  Surgeon: Lanelle Bal, DO;  [facility];  Service: Endoscopy;  Laterality: N/A;  9:15am   ESOPHAGOGASTRODUODENOSCOPY (EGD) WITH PROPOFOL N/A 11/18/2022   Procedure: ESOPHAGOGASTRODUODENOSCOPY (EGD) WITH PROPOFOL;  Surgeon: Corbin Ade, MD;  [facility];  Service: Endoscopy;  Laterality: N/A;  2:15 pm, asa 2   KNEE ARTHROSCOPY WITH MEDIAL MENISECTOMY Left 12/25/2022   Procedure: KNEE ARTHROSCOPY WITH PARTIAL LATERAL MENISCECTOMY DEBRIDEMENT ACL;  Surgeon: Vickki Hearing, MD;  Location: [facility];  Service: Orthopedics;  Laterality: Left;   MALONEY DILATION N/A 11/18/2022   Procedure: Elease Hashimoto DILATION;  Surgeon: Corbin Ade, MD;  [facility];  Service: Endoscopy;  Laterality: N/A;   SALPINGOOPHORECTOMY Bilateral 11/06/2016   Procedure: BILATERAL SALPINGO OOPHORECTOMY;  Surgeon: Lazaro Arms, MD;  Location: [facility];  Service: Gynecology;  Laterality: Bilateral;   SUPRACERVICAL ABDOMINAL HYSTERECTOMY N/A 11/06/2016   Procedure: HYSTERECTOMY SUPRACERVICAL ABDOMINAL;  Surgeon: Lazaro Arms, MD;  Location: [facility];  Service: Gynecology;  Laterality: N/A;   TRANSFORAMINAL LUMBAR INTERBODY FUSION (TLIF) WITH PEDICLE SCREW FIXATION 2 LEVEL Left 07/28/2019   Procedure: LEFT-SIDED LUMBAR 4- 5, LUMBAR 5 -SACRUM1 TRANSFORAMINAL LUMBAR INTERBODY FUSION WITH INSTRUMENTATION AND ALLOGRAFT;  Surgeon: Estill Bamberg, MD;  Location:  [facility];  Service: Orthopedics;  Laterality: Left;   TUBAL LIGATION     Patient Active Problem List   Diagnosis Date Noted   S/P arthroscopy of left knee debridement of ACL/ lateral meniscectomy 12/25/22 01/02/2023   Bucket-handle tear of lateral meniscus of left knee as current injury 12/25/2022   Left anterior cruciate ligament tear 12/25/2022   Acute non-recurrent maxillary sinusitis 10/26/2022   Mucopurulent conjunctivitis of both eyes 10/26/2022    Acute bronchitis 10/25/2022   Maxillary sinusitis, acute 10/25/2022   Conjunctivitis 10/25/2022   Neck pain, chronic 08/28/2022   Sleep apnea 08/28/2022   Need for immunization against influenza 02/28/2022   Screening for cervical cancer 12/21/2021   H. pylori infection 12/05/2021   Snoring 11/23/2021   Insomnia 11/23/2021   Annual physical exam 10/16/2021   Dermatitis 10/16/2021   Need for varicella vaccine 10/16/2021   Diarrhea 07/05/2021   Stomach cramps 07/05/2021   Nausea & vomiting 07/05/2021   Encounter for vaccination 07/05/2021   Right foot pain 05/16/2021   Dysphagia 05/01/2021   Right shoulder pain 12/27/2020   Rash 12/13/2020   Chronic back pain 04/04/2020   History of back surgery 04/04/2020   Radiculopathy 07/28/2019   Borderline diabetes 12/25/2018   Hypertension 06/30/2018   S/P abdominal supracervical subtotal hysterectomy 11/06/2016   Trigger middle finger of right hand 04/24/2016   GERD (gastroesophageal reflux disease) 08/04/2014   Shoulder strain 07/26/2014   Degenerative arthritis of thumb 07/26/2014   Left knee pain 02/28/2014   Hip pain 04/15/2013   Epicondylitis, lateral (tennis elbow) 04/13/2013   Constipation 09/15/2012   Hyperlipidemia 11/28/2011   Obesity, Class I, BMI 30.0-34.9 (see actual BMI) 09/15/2011   ALLERGIC RHINITIS, SEASONAL 11/13/2007    PCP: Gilmore Laroche  REFERRING PROVIDER: Vickki Hearing, MD  REFERRING DIAG: Diagnosis [phone] (ICD-10-CM) - S/P arthroscopy of left knee  THERAPY DIAG:  Left knee pain Muscle weakness Difficulty in walking  Rationale for Evaluation and Treatment: Rehabilitation  ONSET DATE: 12/25/22  Returns to MD 9/30  SUBJECTIVE STATEMENT:  Pt still returned to MD and has to continue to wear brace for 2 more months. Returns to MD 11/25 and back to work on 10/28.  Currently 2/10 Lt knee pain.  Evaluation: Jacqueline [name] states that she had arthroscopic surgery on her knee on 12/25/22.  Pt states  that she has been bending and trying to lift her leg up but it is sore.   PT is unable to states how long she can stand, walk or sit for states she just doesn't know.   *Nov 2017 originally fell on concrete at work and injured lower back  PERTINENT HISTORY: No chief complaint on file.   DOS: 12/25/22   POST OP PLAN  WB as tolerated SUTURES OUT IN A WEEK  AROM  BRACE playmaker brace for 6 weeks and PT for 6 weeks to emphasize quadricep strengthening and overall leg conditioning   Prognosis guarded.  Expect lateral compartment degeneration and chondrosis        PRE-OPERATIVE DIAGNOSIS:  Torn medical meniscus left knee   POST-OPERATIVE DIAGNOSIS:  torn lateral meniscus and partial tear acl    PROCEDURE:  Procedure(s): LEFT KNEE ARTHROSCOPY WITH PARTIAL LATERAL MEDIAL MENISCECTOMY AND DEBRIDEMENT OF ACL        PAIN:  Are you having pain? Yes: NPRS scale: 2/10 Pain location: left knee Pain description: sore Aggravating factors: WB Relieving factors: elevating and putting ice on it.    PRECAUTIONS: BRACE playmaker brace  for 6 weeks and PT for 6 weeks to emphasize quadricep strengthening and overall leg conditioning     WEIGHT BEARING RESTRICTIONS:  WBAT FALLS:  Has patient fallen in last 6 months? 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)  PLOF: Independent  PATIENT GOALS: walk better  NEXT MD VISIT: after therapy  OBJECTIVE:   COGNITION: Overall cognitive status: Within functional limits for tasks assessed      EDEMA:  Normal for post op   MUSCLE LENGTH: Hamstrings: Right 140 deg; Left 135 deg  PALPATION: tender  LOWER EXTREMITY ROM:  Active ROM Right eval Left eval left Left 01/20/23 Left 02/26/23  Hip flexion       Hip extension        Hip abduction       Hip adduction       Hip internal rotation       Hip external rotation       Knee flexion  105 115 123 115  Knee extension  3 2 2  -5  Ankle dorsiflexion       Ankle plantarflexion       Ankle inversion       Ankle eversion        (Blank rows = not tested)  LOWER EXTREMITY MMT:  MMT Right eval Left eval Left 02/26/23  Hip flexion  2 3+  Hip extension     Hip abduction  3-   Hip adduction     Hip internal rotation     Hip external rotation     Knee flexion  3+ 4+  Knee extension  3 4+  Ankle dorsiflexion   5  Ankle plantarflexion     Ankle inversion     Ankle eversion      (Blank rows = not tested)    FUNCTIONAL TESTS: (with knee brace) 2 minute walk test: 322 with knee brace and rollaltor Single leg stance:  21" on RT; 3" on LT  Sit to stand:  5x  with UE assist: 44 seconds     TODAY'S TREATMENT:                                                                                                                              DATE:  03/12/23 Bike seat 6; 6 minutes level 2 Standing:  heel raises 20X  Squats 20X ea  Lunges onto 4" step 20X no UE assist  4" step up forward 2X10 with 1 UE  4" lateral step up 2X10 with 1 UE  4" forward lunge without UE assist 2X10  Bodycraft TKE Lt only 3PL 2X10 5" holds Hip abduction bilaterally 20X each  Hip extension bilaterally 20X each Sit to stands no UE 10X  03/06/23 Bike seat 8; 5 minutes Standing:  heel raises 20X  Squats 20X ea  Lunges onto 4" step 20X no UE assist  4" step up forward 2X10   4" lateral step up 2X10 Hip abduction bilaterally 20X each  Hip extension bilaterally 20X each Sit to stands no UE 10X  02/26/23 Progress note 2 MWT 272 ft with knee brace and no AD SLS 7" on left MMT's and AROM see above 5 x sit to stand 30.06 no UE assist (back pain) 4" steps with bilat UE assist reciprocal pattern  02/11/23: Standing:  Heel raise on incline x 20  Toe raises declined slope 20x Squat x  20  Lunge onto 4" step 20X each min UE assist  Forward step ups 4" 20X UE assist bilaterally  Lateral step ups 4" 20X UE assist bilaterally  Hip abduction bilaterally 20X each  Hip extension bilaterally 20X each Sit to stand 15x eccentric control no HHA  01/31/23: Heel raise x 15 Squat x 15 Toe raises declined slope 15x TKE GTB 10x 5" Sit to stand 15x eccentric control no HHA Lunge onto 4in step 15x Forward step up 4in 15 Lateral step up 4in 15x   01/20/23  Gait without AD x 226 ft.   Heel raise x 15 Squat x 15 Lt knee flexion 3# x 10 Lunge onto 4" box x 15 Step up 4" x 10   Lateral step up x 10  Wall squat x 10  Sit to stand x 15 no UE assist  Supine:   SLR x 10  Q-Set x 10 bridge x 15 Bike x 5 minutes   01/15/23 Heel raises x 15 Terminal extension x 10 Lunging onto 12" box x 5 Squat x 10 Lt knee flexion 3# x 10 Sitting : LAQ 3# x 15 Sit to stand x 10 no UE assist  Supine: SAQ 3# x 10 Quad set x 10 Heel slide x 10  SLR:  5   01/07/23 Standing:  heel raises 10X  Toe raises 10X  Lt knee flexion 10X Ambulation 200 feet with SPC Sitting:  LAQ 10X5"  Sit to stand 10X no UE Supine:  heelslides 10X  Quad sets 10X5"  SLR unable AROM 110 degrees flexion  Evaluation 01/06/23 Sitting:   LAQ x 10 Sit to stand x 5 Supine: Heel slide x 10 Quad set x 10  SLR x 5   Rt side lying :  ABduction x 5  Prone: Knee flexion x 10    PATIENT EDUCATION:  Education details: HEp Person educated: Patient Education method: Chief Technology Officer Education comprehension: verbalized understanding and returned demonstration  HOME EXERCISE PROGRAM: Access Code: [email or web address] URL: [email or web address] Date: 01/06/2023 Prepared by: Virgina Organ  Exercises - Seated Long Arc Quad  - 3 x daily - 7 x weekly - 1 sets - 10 reps - 5" hold - Supine Quadricep Sets  - 3 x daily - 7 x weekly - 1 sets - 10 reps - 5" hold - Active Straight Leg Raise with Quad Set  -  2 x daily - 7 x weekly - 1 sets - 10 reps - 5" hold - Supine Heel Slide  - 2 x daily - 7 x weekly - 1 sets - 10 reps - 5" hold - Sit to Stand with Counter Support  - 3 x daily - 7 x weekly - 1 sets - 10 reps  ASSESSMENT:  CLINICAL IMPRESSION: Continued focus on strengthening of LE's. Completed session without  brace donned.  Able to complete lunges and squats without UE assist today and less cues for form.  Began TKE with bodycraft weighted machine with good form and adequate hold times.  No pain or issues voiced during or at completion of session today.  Patient will continue to  benefit from skilled therapy services to address remaining unmet and partially met goals.   Evaluation:  Patient is a 62 y.o. female  who was seen today for physical therapy evaluation and treatment for s/p Lt arthroscopic surgery.  Evaluation demonstrates decreased ROM, decreased strength, decreased balance, decreased activity tolerance and increased pain. Jacqueline [name] will benefit from skilled PT to address these deficits and maximize her functional ability  OBJECTIVE IMPAIRMENTS: Abnormal gait, decreased activity tolerance, decreased balance, decreased mobility, difficulty walking, decreased ROM, increased edema, and pain.   ACTIVITY LIMITATIONS: carrying, lifting, bending, sitting, standing, stairs, and locomotion level  PARTICIPATION LIMITATIONS: cleaning, shopping, community activity, and occupation  PERSONAL FACTORS: Fitness are also affecting patient's functional outcome.   REHAB POTENTIAL: Good  CLINICAL DECISION MAKING: Stable/uncomplicated  EVALUATION COMPLEXITY: Low   GOALS: Goals reviewed with patient? Yes  SHORT TERM GOALS: Target date: 01/27/23 PT to be I in HEP in order to decrease her pain to no greater than a 4/10 Baseline: Goal status: IN PROGRESS  2.  PT core and LE strength increased to allow pt to be ambulating with a cane  Baseline:  Goal status: IN PROGRESS  3.  Pt ROM increased to  120 to be able to squat down.  Baseline: 115 left knee 9/18 Goal status: IN PROGRESS   LONG TERM GOALS: Target date: 03/26/2023  PT to be I in an advanced  HEP in order to decrease her pain to no greater than a 1/10 Baseline:  Goal status: IN PROGRESS   2.  PT core and LE strength increased to allow pt to be ambulating without an assistive device  Baseline:  Goal status: met   3.  PT to be able to go up and down 4 steps in a reciprocal manner  Baseline:  Goal status: IN PROGRESS   4.  PT to be able to be on her feet standing/walking for 2 hours at a time to be able to return to work  Baseline: has not returned to work yet Goal status: IN PROGRESS   PLAN:  PT FREQUENCY: 2x/week  PT DURATION: 6 weeks  PLANNED INTERVENTIONS: Therapeutic exercises, Therapeutic activity, Gait training, Patient/Family education, Self Care, Joint mobilization, and Manual therapy  PLAN FOR NEXT SESSION: Progress WB strengthening and stability.    9:33 AM, 03/12/23 Lurena Nida, PTA/CLT [facility] Ph: [phone]

## 2023-03-12 NOTE — Telephone Encounter (Signed)
Received vm from patient wanting to get copy of her medical records. IC,lmvm advised need to sign authorization. Once that is received, the request can be processed. Pts ph [phone]

## 2023-03-14 ENCOUNTER — Ambulatory Visit (HOSPITAL_COMMUNITY): Payer: 59

## 2023-03-14 DIAGNOSIS — M6281 Muscle weakness (generalized): Secondary | ICD-10-CM | POA: Diagnosis not present

## 2023-03-14 DIAGNOSIS — M5459 Other low back pain: Secondary | ICD-10-CM | POA: Diagnosis not present

## 2023-03-14 DIAGNOSIS — M25562 Pain in left knee: Secondary | ICD-10-CM | POA: Diagnosis not present

## 2023-03-14 DIAGNOSIS — M25662 Stiffness of left knee, not elsewhere classified: Secondary | ICD-10-CM | POA: Diagnosis not present

## 2023-03-14 NOTE — Therapy (Signed)
OUTPATIENT PHYSICAL THERAPY TREATMENT  Patient Name: Jacqueline [name] MRN: [id number] DOB:14-Feb-1961, 62 y.o., female Today's Date: 03/14/2023  END OF SESSION:  PT End of Session - 03/14/23 0936     Visit Number 10    Number of Visits 20    Date for PT Re-Evaluation 03/26/23    Authorization Type [employer]    PT Start Time [phone]    PT Stop Time 1015    PT Time Calculation (min) 39 min    Activity Tolerance Patient tolerated treatment well    Behavior During Therapy Jacqueline [name] for tasks assessed/performed              Past Medical History:  Diagnosis Date   Anemia    Arthritis    per patient, back   Complication of anesthesia 2018   per patient HR dropped during partial hysterectomy surgery   Fibroid uterus    GERD (gastroesophageal reflux disease)    H/O total hysterectomy    Heart murmur    Hyperlipidemia    Borderline   Hypertension    Numbness and tingling in right hand    Pre-diabetes    borderline   Sleep apnea    Past Surgical History:  Procedure Laterality Date   BACK SURGERY     BALLOON DILATION N/A 05/22/2021   Procedure: BALLOON DILATION;  Surgeon: Lanelle Bal, DO;  [facility];  Service: Endoscopy;  Laterality: N/A;   BIOPSY  11/18/2022   Procedure: BIOPSY;  Surgeon: Corbin Ade, MD;  [facility]: [facility];  Service: Endoscopy;;   CHOLECYSTECTOMY     COLONOSCOPY  2003   Dr. Jena Gauss: Anal canal hemorrhoids   COLONOSCOPY N/A 08/25/2014   Normal colonoscopy.  Next colonoscopy in 2026.  Procedure: COLONOSCOPY;  Surgeon: Corbin Ade, MD;  [facility]: [facility];  Service: Endoscopy;  Laterality: N/A;  1200   ESOPHAGOGASTRODUODENOSCOPY N/A 08/25/2014   Procedure: ESOPHAGOGASTRODUODENOSCOPY (EGD);  Surgeon: Corbin Ade, MD;  [facility]: [facility];  Service: Endoscopy;  Laterality: N/A;   ESOPHAGOGASTRODUODENOSCOPY (EGD) WITH PROPOFOL N/A 05/22/2021   Web and distal esophagus, dilated, gastriti, biopsy positive for mildly active  H. pylori gastritis.  s procedure: ESOPHAGOGASTRODUODENOSCOPY (EGD) WITH PROPOFOL;  Surgeon: Lanelle Bal, DO;  [facility];  Service: Endoscopy;  Laterality: N/A;  9:15am   ESOPHAGOGASTRODUODENOSCOPY (EGD) WITH PROPOFOL N/A 11/18/2022   Procedure: ESOPHAGOGASTRODUODENOSCOPY (EGD) WITH PROPOFOL;  Surgeon: Corbin Ade, MD;  [facility];  Service: Endoscopy;  Laterality: N/A;  2:15 pm, asa 2   KNEE ARTHROSCOPY WITH MEDIAL MENISECTOMY Left 12/25/2022   Procedure: KNEE ARTHROSCOPY WITH PARTIAL LATERAL MENISCECTOMY DEBRIDEMENT ACL;  Surgeon: Vickki Hearing, MD;  Location: [facility];  Service: Orthopedics;  Laterality: Left;   MALONEY DILATION N/A 11/18/2022   Procedure: Elease Hashimoto DILATION;  Surgeon: Corbin Ade, MD;  [facility];  Service: Endoscopy;  Laterality: N/A;   SALPINGOOPHORECTOMY Bilateral 11/06/2016   Procedure: BILATERAL SALPINGO OOPHORECTOMY;  Surgeon: Lazaro Arms, MD;  Location: [facility];  Service: Gynecology;  Laterality: Bilateral;   SUPRACERVICAL ABDOMINAL HYSTERECTOMY N/A 11/06/2016   Procedure: HYSTERECTOMY SUPRACERVICAL ABDOMINAL;  Surgeon: Lazaro Arms, MD;  Location: [facility];  Service: Gynecology;  Laterality: N/A;   TRANSFORAMINAL LUMBAR INTERBODY FUSION (TLIF) WITH PEDICLE SCREW FIXATION 2 LEVEL Left 07/28/2019   Procedure: LEFT-SIDED LUMBAR 4- 5, LUMBAR 5 -SACRUM1 TRANSFORAMINAL LUMBAR INTERBODY FUSION WITH INSTRUMENTATION AND ALLOGRAFT;  Surgeon: Estill Bamberg, MD;  Location:  [facility];  Service: Orthopedics;  Laterality: Left;   TUBAL LIGATION     Patient Active Problem List   Diagnosis Date Noted   S/P arthroscopy of left knee debridement of ACL/ lateral meniscectomy 12/25/22 01/02/2023   Bucket-handle tear of lateral meniscus of left knee as current injury 12/25/2022   Left anterior cruciate ligament tear 12/25/2022   Acute non-recurrent maxillary sinusitis 10/26/2022   Mucopurulent conjunctivitis of both eyes 10/26/2022    Acute bronchitis 10/25/2022   Maxillary sinusitis, acute 10/25/2022   Conjunctivitis 10/25/2022   Neck pain, chronic 08/28/2022   Sleep apnea 08/28/2022   Need for immunization against influenza 02/28/2022   Screening for cervical cancer 12/21/2021   H. pylori infection 12/05/2021   Snoring 11/23/2021   Insomnia 11/23/2021   Annual physical exam 10/16/2021   Dermatitis 10/16/2021   Need for varicella vaccine 10/16/2021   Diarrhea 07/05/2021   Stomach cramps 07/05/2021   Nausea & vomiting 07/05/2021   Encounter for vaccination 07/05/2021   Right foot pain 05/16/2021   Dysphagia 05/01/2021   Right shoulder pain 12/27/2020   Rash 12/13/2020   Chronic back pain 04/04/2020   History of back surgery 04/04/2020   Radiculopathy 07/28/2019   Borderline diabetes 12/25/2018   Hypertension 06/30/2018   S/P abdominal supracervical subtotal hysterectomy 11/06/2016   Trigger middle finger of right hand 04/24/2016   GERD (gastroesophageal reflux disease) 08/04/2014   Shoulder strain 07/26/2014   Degenerative arthritis of thumb 07/26/2014   Left knee pain 02/28/2014   Hip pain 04/15/2013   Epicondylitis, lateral (tennis elbow) 04/13/2013   Constipation 09/15/2012   Hyperlipidemia 11/28/2011   Obesity, Class I, BMI 30.0-34.9 (see actual BMI) 09/15/2011   ALLERGIC RHINITIS, SEASONAL 11/13/2007    PCP: Gilmore Laroche  REFERRING PROVIDER: Vickki Hearing, MD  REFERRING DIAG: Diagnosis [phone] (ICD-10-CM) - S/P arthroscopy of left knee  THERAPY DIAG:  Left knee pain Muscle weakness Difficulty in walking  Rationale for Evaluation and Treatment: Rehabilitation  ONSET DATE: 12/25/22  Returns to MD 9/30  SUBJECTIVE STATEMENT:   Returns to MD 11/25 and back to work on 10/28.   2/10 Lt knee pain today. Sometimes brace slides down on knee but she is still wearing it. Having some right hip pain; MD feels this is radiating from back.    Evaluation: [name] states that she had  arthroscopic surgery on her knee on 12/25/22.  Pt states that she has been bending and trying to lift her leg up but it is sore.   PT is unable to states how long she can stand, walk or sit for states she just doesn't know.   *Nov 2017 originally fell on concrete at work and injured lower back  PERTINENT HISTORY: No chief complaint on file.   DOS: 12/25/22   POST OP PLAN  WB as tolerated SUTURES OUT IN A WEEK  AROM  BRACE playmaker brace for 6 weeks and PT for 6 weeks to emphasize quadricep strengthening and overall leg conditioning   Prognosis guarded.  Expect lateral compartment degeneration and chondrosis        PRE-OPERATIVE DIAGNOSIS:  Torn medical meniscus left knee   POST-OPERATIVE DIAGNOSIS:  torn lateral meniscus and partial tear acl    PROCEDURE:  Procedure(s): LEFT KNEE ARTHROSCOPY WITH PARTIAL LATERAL MEDIAL MENISCECTOMY AND DEBRIDEMENT OF ACL        PAIN:  Are you having pain? Yes: NPRS scale: 2/10 Pain location: left knee Pain description: sore Aggravating factors: WB Relieving factors: elevating  and putting ice on it.    PRECAUTIONS: BRACE playmaker brace for 6 weeks and PT for 6 weeks to emphasize quadricep strengthening and overall leg conditioning     WEIGHT BEARING RESTRICTIONS:  WBAT FALLS:  Has patient fallen in last 6 months? 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)  PLOF: Independent  PATIENT GOALS: walk better  NEXT MD VISIT: after therapy  OBJECTIVE:   COGNITION: Overall cognitive status: Within functional limits for tasks assessed      EDEMA:  Normal for post op   MUSCLE LENGTH: Hamstrings: Right 140 deg; Left 135 deg  PALPATION: tender  LOWER EXTREMITY ROM:  Active ROM Right eval Left eval left  Left 01/20/23 Left 02/26/23  Hip flexion       Hip extension       Hip abduction       Hip adduction       Hip internal rotation       Hip external rotation       Knee flexion  105 115 123 115  Knee extension  3 2 2  -5  Ankle dorsiflexion       Ankle plantarflexion       Ankle inversion       Ankle eversion        (Blank rows = not tested)  LOWER EXTREMITY MMT:  MMT Right eval Left eval Left 02/26/23  Hip flexion  2 3+  Hip extension     Hip abduction  3-   Hip adduction     Hip internal rotation     Hip external rotation     Knee flexion  3+ 4+  Knee extension  3 4+  Ankle dorsiflexion   5  Ankle plantarflexion     Ankle inversion     Ankle eversion      (Blank rows = not tested)    FUNCTIONAL TESTS: (with knee brace) 2 minute walk test: 322 with knee brace and rollaltor Single leg stance:  21" on RT; 3" on LT  Sit to stand:  5x  with UE assist: 44 seconds     TODAY'S TREATMENT:                                                                                                                              DATE:  03/14/23 Bike seat 6 x 6' dynamic warm up  Standing: Heel raises on incline 2 x 10 Toe raises on decline 2 x 10 6" step ups 2 x 10 left leg leading; bilateral upper extremity support Hip abduction 2 x 10 each Hip extension 2 x 10 each Leg press 3 plates 2 x 10 Tandem stance 20" each  03/12/23 Bike seat 6; 6  minutes level 2 Standing:  heel raises 20X  Squats 20X ea  Lunges onto 4" step 20X no UE assist  4" step up forward 2X10 with 1 UE  4" lateral step up 2X10 with 1 UE  4" forward lunge without UE assist 2X10  [place] TKE Lt only 3PL 2X10 5" holds Hip abduction bilaterally 20X each  Hip extension bilaterally 20X each Sit to stands no UE 10X  03/06/23 Bike seat 8; 5 minutes Standing:  heel raises 20X  Squats 20X ea  Lunges onto 4" step 20X no UE assist  4" step up forward 2X10   4" lateral step up 2X10 Hip abduction bilaterally 20X  each  Hip extension bilaterally 20X each Sit to stands no UE 10X  02/26/23 Progress note 2 MWT 272 ft with knee brace and no AD SLS 7" on left MMT's and AROM see above 5 x sit to stand 30.06 no UE assist (back pain) 4" steps with bilat UE assist reciprocal pattern  02/11/23: Standing:  Heel raise on incline x 20  Toe raises declined slope 20x Squat x 20  Lunge onto 4" step 20X each min UE assist  Forward step ups 4" 20X UE assist bilaterally  Lateral step ups 4" 20X UE assist bilaterally  Hip abduction bilaterally 20X each  Hip extension bilaterally 20X each Sit to stand 15x eccentric control no HHA  01/31/23: Heel raise x 15 Squat x 15 Toe raises declined slope 15x TKE GTB 10x 5" Sit to stand 15x eccentric control no HHA Lunge onto 4in step 15x Forward step up 4in 15 Lateral step up 4in 15x   01/20/23  Gait without AD x 226 ft.   Heel raise x 15 Squat x 15 Lt knee flexion 3# x 10 Lunge onto 4" box x 15 Step up 4" x 10   Lateral step up x 10  Wall squat x 10  Sit to stand x 15 no UE assist  Supine:   SLR x 10  Q-Set x 10 bridge x 15 Bike x 5 minutes   01/15/23 Heel raises x 15 Terminal extension x 10 Lunging onto 12" box x 5 Squat x 10 Lt knee flexion 3# x 10 Sitting : LAQ 3# x 15 Sit to stand x 10 no UE assist  Supine: SAQ 3# x 10 Quad set x 10 Heel slide x 10  SLR:  5   01/07/23 Standing:  heel raises 10X  Toe raises 10X  Lt knee flexion 10X Ambulation 200 feet with SPC Sitting:  LAQ 10X5"  Sit to stand 10X no UE Supine:  heelslides 10X  Quad sets 10X5"  SLR unable AROM 110 degrees flexion  Evaluation 01/06/23 Sitting:   LAQ x 10 Sit to stand x 5 Supine: Heel slide x 10 Quad set x 10  SLR x 5   Rt side lying :  ABduction x 5  Prone: Knee flexion x 10    PATIENT EDUCATION:  Education details: HEp Person educated: Patient Education method: Chief Technology Officer Education comprehension: verbalized understanding and returned  demonstration  HOME EXERCISE PROGRAM: Access Code: [email or web address] URL: [email or web address] Date: 01/06/2023 Prepared by: Virgina Organ  Exercises - Seated Long Arc Quad  - 3 x daily - 7 x weekly - 1 sets - 10 reps - 5" hold - Supine Quadricep Sets  - 3 x daily - 7 x weekly - 1 sets - 10 reps - 5" hold - Active Straight Leg Raise with Quad Set  -  2 x daily - 7 x weekly - 1 sets - 10 reps - 5" hold - Supine Heel Slide  - 2 x daily - 7 x weekly - 1 sets - 10 reps - 5" hold - Sit to Stand with Counter Support  - 3 x daily - 7 x weekly - 1 sets - 10 reps  ASSESSMENT:  CLINICAL IMPRESSION: Arrives without cane today.  Continued focus on strengthening of LE's. Completed session without brace on. Increased step up height to 6" today with good challenge. Added leg press today without issue.    Intermittent back pain throughout treatment.  Patient will continue to  benefit from skilled therapy services to address remaining unmet and partially met goals.   Evaluation:  Patient is a 62 y.o. female  who was seen today for physical therapy evaluation and treatment for s/p Lt arthroscopic surgery.  Evaluation demonstrates decreased ROM, decreased strength, decreased balance, decreased activity tolerance and increased pain. Ms. Maren Reamer will benefit from skilled PT to address these deficits and maximize her functional ability  OBJECTIVE IMPAIRMENTS: Abnormal gait, decreased activity tolerance, decreased balance, decreased mobility, difficulty walking, decreased ROM, increased edema, and pain.   ACTIVITY LIMITATIONS: carrying, lifting, bending, sitting, standing, stairs, and locomotion level  PARTICIPATION LIMITATIONS: cleaning, shopping, community activity, and occupation  PERSONAL FACTORS: Fitness are also affecting patient's functional outcome.   REHAB POTENTIAL: Good  CLINICAL DECISION MAKING: Stable/uncomplicated  EVALUATION COMPLEXITY: Low   GOALS: Goals reviewed with patient?  Yes  SHORT TERM GOALS: Target date: 01/27/23 PT to be I in HEP in order to decrease her pain to no greater than a 4/10 Baseline: Goal status: IN PROGRESS  2.  PT core and LE strength increased to allow pt to be ambulating with a cane  Baseline:  Goal status: IN PROGRESS  3.  Pt ROM increased to 120 to be able to squat down.  Baseline: 115 left knee 9/18 Goal status: IN PROGRESS   LONG TERM GOALS: Target date: 03/26/2023  PT to be I in an advanced  HEP in order to decrease her pain to no greater than a 1/10 Baseline:  Goal status: IN PROGRESS   2.  PT core and LE strength increased to allow pt to be ambulating without an assistive device  Baseline:  Goal status: met   3.  PT to be able to go up and down 4 steps in a reciprocal manner  Baseline:  Goal status: IN PROGRESS   4.  PT to be able to be on her feet standing/walking for 2 hours at a time to be able to return to work  Baseline: has not returned to work yet Goal status: IN PROGRESS   PLAN:  PT FREQUENCY: 2x/week  PT DURATION: 6 weeks  PLANNED INTERVENTIONS: Therapeutic exercises, Therapeutic activity, Gait training, Patient/Family education, Self Care, Joint mobilization, and Manual therapy  PLAN FOR NEXT SESSION: Progress WB strengthening and stability.    10:01 AM, 03/14/23 Onedia Vargus [employer] MPT [facility] [place] [phone]

## 2023-03-19 ENCOUNTER — Telehealth: Payer: Self-pay

## 2023-03-19 NOTE — Telephone Encounter (Signed)
Returned the pt's call and was advised it was after 4 something when she called yesterday. Pt was checking to make sure that labs were in the system for her testing. Pt advised of the labs and where to go. Pt expressed understanding

## 2023-03-19 NOTE — Telephone Encounter (Signed)
Pt called and left a vm requesting a return call from the nurse yesterday.

## 2023-03-20 ENCOUNTER — Ambulatory Visit (HOSPITAL_COMMUNITY): Payer: 59 | Admitting: Physical Therapy

## 2023-03-20 DIAGNOSIS — M25562 Pain in left knee: Secondary | ICD-10-CM | POA: Diagnosis not present

## 2023-03-20 DIAGNOSIS — M5459 Other low back pain: Secondary | ICD-10-CM | POA: Diagnosis not present

## 2023-03-20 DIAGNOSIS — M6281 Muscle weakness (generalized): Secondary | ICD-10-CM | POA: Diagnosis not present

## 2023-03-20 DIAGNOSIS — M25662 Stiffness of left knee, not elsewhere classified: Secondary | ICD-10-CM | POA: Diagnosis not present

## 2023-03-20 NOTE — Therapy (Signed)
OUTPATIENT PHYSICAL THERAPY TREATMENT  Patient Name: Jacqueline [name] MRN: [id number] DOB:Jul 11, 1960, 62 y.o., female Today's Date: 03/20/2023  END OF SESSION:  PT End of Session - 03/20/23 1001     Visit Number 11    Number of Visits 20    Date for PT Re-Evaluation 03/26/23    Authorization Type [employer]    PT Start Time [phone]    PT Stop Time 0930    PT Time Calculation (min) 40 min    Activity Tolerance Patient tolerated treatment well    Behavior During Therapy [facility] for tasks assessed/performed               Past Medical History:  Diagnosis Date   Anemia    Arthritis    per patient, back   Complication of anesthesia 2018   per patient HR dropped during partial hysterectomy surgery   Fibroid uterus    GERD (gastroesophageal reflux disease)    H/O total hysterectomy    Heart murmur    Hyperlipidemia    Borderline   Hypertension    Numbness and tingling in right hand    Pre-diabetes    borderline   Sleep apnea    Past Surgical History:  Procedure Laterality Date   BACK SURGERY     BALLOON DILATION N/A 05/22/2021   Procedure: BALLOON DILATION;  Surgeon: Lanelle Bal, DO;  [facility];  Service: Endoscopy;  Laterality: N/A;   BIOPSY  11/18/2022   Procedure: BIOPSY;  Surgeon: Corbin Ade, MD;  [facility]: [facility];  Service: Endoscopy;;   CHOLECYSTECTOMY     COLONOSCOPY  2003   Dr. Jena Gauss: Anal canal hemorrhoids   COLONOSCOPY N/A 08/25/2014   Normal colonoscopy.  Next colonoscopy in 2026.  Procedure: COLONOSCOPY;  Surgeon: Corbin Ade, MD;  [facility]: [facility];  Service: Endoscopy;  Laterality: N/A;  1200   ESOPHAGOGASTRODUODENOSCOPY N/A 08/25/2014   Procedure: ESOPHAGOGASTRODUODENOSCOPY (EGD);  Surgeon: Corbin Ade, MD;  [facility];  Service: Endoscopy;  Laterality: N/A;   ESOPHAGOGASTRODUODENOSCOPY (EGD) WITH PROPOFOL N/A 05/22/2021   Web and distal esophagus, dilated, gastriti, biopsy positive for mildly  active H. pylori gastritis.  s procedure: ESOPHAGOGASTRODUODENOSCOPY (EGD) WITH PROPOFOL;  Surgeon: Lanelle Bal, DO;  [facility];  Service: Endoscopy;  Laterality: N/A;  9:15am   ESOPHAGOGASTRODUODENOSCOPY (EGD) WITH PROPOFOL N/A 11/18/2022   Procedure: ESOPHAGOGASTRODUODENOSCOPY (EGD) WITH PROPOFOL;  Surgeon: Corbin Ade, MD;  [facility];  Service: Endoscopy;  Laterality: N/A;  2:15 pm, asa 2   KNEE ARTHROSCOPY WITH MEDIAL MENISECTOMY Left 12/25/2022   Procedure: KNEE ARTHROSCOPY WITH PARTIAL LATERAL MENISCECTOMY DEBRIDEMENT ACL;  Surgeon: Vickki Hearing, MD;  Location: [facility];  Service: Orthopedics;  Laterality: Left;   MALONEY DILATION N/A 11/18/2022   Procedure: Elease Hashimoto DILATION;  Surgeon: Corbin Ade, MD;  [facility];  Service: Endoscopy;  Laterality: N/A;   SALPINGOOPHORECTOMY Bilateral 11/06/2016   Procedure: BILATERAL SALPINGO OOPHORECTOMY;  Surgeon: Lazaro Arms, MD;  Location: [facility];  Service: Gynecology;  Laterality: Bilateral;   SUPRACERVICAL ABDOMINAL HYSTERECTOMY N/A 11/06/2016   Procedure: HYSTERECTOMY SUPRACERVICAL ABDOMINAL;  Surgeon: Lazaro Arms, MD;  Location: [facility];  Service: Gynecology;  Laterality: N/A;   TRANSFORAMINAL LUMBAR INTERBODY FUSION (TLIF) WITH PEDICLE SCREW FIXATION 2 LEVEL Left 07/28/2019   Procedure: LEFT-SIDED LUMBAR 4- 5, LUMBAR 5 -SACRUM1 TRANSFORAMINAL LUMBAR INTERBODY FUSION WITH INSTRUMENTATION AND ALLOGRAFT;  Surgeon: Estill Bamberg, MD;  Location: [facility];  Service: Orthopedics;  Laterality: Left;   TUBAL LIGATION     Patient Active Problem List   Diagnosis Date Noted   S/P arthroscopy of left knee debridement of ACL/ lateral meniscectomy 12/25/22 01/02/2023   Bucket-handle tear of lateral meniscus of left knee as current injury 12/25/2022   Left anterior cruciate ligament tear 12/25/2022   Acute non-recurrent maxillary sinusitis 10/26/2022   Mucopurulent conjunctivitis of both eyes 10/26/2022    Acute bronchitis 10/25/2022   Maxillary sinusitis, acute 10/25/2022   Conjunctivitis 10/25/2022   Neck pain, chronic 08/28/2022   Sleep apnea 08/28/2022   Need for immunization against influenza 02/28/2022   Screening for cervical cancer 12/21/2021   H. pylori infection 12/05/2021   Snoring 11/23/2021   Insomnia 11/23/2021   Annual physical exam 10/16/2021   Dermatitis 10/16/2021   Need for varicella vaccine 10/16/2021   Diarrhea 07/05/2021   Stomach cramps 07/05/2021   Nausea & vomiting 07/05/2021   Encounter for vaccination 07/05/2021   Right foot pain 05/16/2021   Dysphagia 05/01/2021   Right shoulder pain 12/27/2020   Rash 12/13/2020   Chronic back pain 04/04/2020   History of back surgery 04/04/2020   Radiculopathy 07/28/2019   Borderline diabetes 12/25/2018   Hypertension 06/30/2018   S/P abdominal supracervical subtotal hysterectomy 11/06/2016   Trigger middle finger of right hand 04/24/2016   GERD (gastroesophageal reflux disease) 08/04/2014   Shoulder strain 07/26/2014   Degenerative arthritis of thumb 07/26/2014   Left knee pain 02/28/2014   Hip pain 04/15/2013   Epicondylitis, lateral (tennis elbow) 04/13/2013   Constipation 09/15/2012   Hyperlipidemia 11/28/2011   Obesity, Class I, BMI 30.0-34.9 (see actual BMI) 09/15/2011   ALLERGIC RHINITIS, SEASONAL 11/13/2007    PCP: Gilmore Laroche  REFERRING PROVIDER: Vickki Hearing, MD  REFERRING DIAG: Diagnosis [id number] (ICD-10-CM) - S/P arthroscopy of left knee  THERAPY DIAG:  Left knee pain Muscle weakness Difficulty in walking  Rationale for Evaluation and Treatment: Rehabilitation  ONSET DATE: 12/25/22  Returns to MD 9/30  SUBJECTIVE STATEMENT:   Still with plans to return to work on 10/28.   Pain remains, back pain also from time to time with different activities.     Evaluation: Ms. Blane states that she had arthroscopic surgery on her knee on 12/25/22.  Pt states that she has been bending and  trying to lift her leg up but it is sore.   PT is unable to states how long she can stand, walk or sit for states she just doesn't know.   *Nov 2017 originally fell on concrete at work and injured lower back  PERTINENT HISTORY: No chief complaint on file.   DOS: 12/25/22   POST OP PLAN  WB as tolerated SUTURES OUT IN A WEEK  AROM  BRACE playmaker brace for 6 weeks and PT for 6 weeks to emphasize quadricep strengthening and overall leg conditioning   Prognosis guarded.  Expect lateral compartment degeneration and chondrosis        PRE-OPERATIVE DIAGNOSIS:  Torn medical meniscus left knee   POST-OPERATIVE DIAGNOSIS:  torn lateral meniscus and partial tear acl    PROCEDURE:  Procedure(s): LEFT KNEE ARTHROSCOPY WITH PARTIAL LATERAL MEDIAL MENISCECTOMY AND DEBRIDEMENT OF ACL        PAIN:  Are you having pain? Yes: NPRS scale: 2/10 Pain location: left knee Pain description: sore Aggravating factors: WB Relieving factors: elevating and putting ice on it.    PRECAUTIONS: BRACE playmaker brace for 6 weeks and  PT for 6 weeks to emphasize quadricep strengthening and overall leg conditioning     WEIGHT BEARING RESTRICTIONS:  WBAT FALLS:  Has patient fallen in last 6 months? 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)  PLOF: Independent  PATIENT GOALS: walk better  NEXT MD VISIT: after therapy  OBJECTIVE:   COGNITION: Overall cognitive status: Within functional limits for tasks assessed      EDEMA:  Normal for post op   MUSCLE LENGTH: Hamstrings: Right 140 deg; Left 135 deg  PALPATION: tender  LOWER EXTREMITY ROM:  Active ROM Right eval Left eval left Left 01/20/23 Left 02/26/23  Hip flexion       Hip extension       Hip abduction       Hip  adduction       Hip internal rotation       Hip external rotation       Knee flexion  105 115 123 115  Knee extension  3 2 2  -5  Ankle dorsiflexion       Ankle plantarflexion       Ankle inversion       Ankle eversion        (Blank rows = not tested)  LOWER EXTREMITY MMT:  MMT Right eval Left eval Left 02/26/23  Hip flexion  2 3+  Hip extension     Hip abduction  3-   Hip adduction     Hip internal rotation     Hip external rotation     Knee flexion  3+ 4+  Knee extension  3 4+  Ankle dorsiflexion   5  Ankle plantarflexion     Ankle inversion     Ankle eversion      (Blank rows = not tested)    FUNCTIONAL TESTS: (with knee brace) 2 minute walk test: 322 with knee brace and rollaltor Single leg stance:  21" on RT; 3" on LT  Sit to stand:  5x  with UE assist: 44 seconds     TODAY'S TREATMENT:                                                                                                                              DATE:  03/20/23 Nustep 5 minutes level 4 Standing Heel raises on incline 2 x 10 Toe raises on decline 2 x 10 6" forward step ups bil LE 2 x 10 bil UE assist 6" lateral step ups bil LE 2X10 bil UE assist Hip abduction 2 x 10 each Hip extension 2 x 10 each Leg press 3 plates 2 x 10 Vectors (3 way kick) 5X5" each with 1 UE assist  03/14/23 Bike seat 6 x 6' dynamic warm up  Standing: Heel raises on incline 2 x 10 Toe raises on decline 2 x 10 6" step ups 2 x 10 left leg leading; bilateral upper extremity support Hip abduction 2 x 10 each Hip extension 2 x 10 each Leg press 3 plates 2 x 10 Tandem stance 20" each  03/12/23 Bike seat 6; 6 minutes level 2 Standing:  heel raises 20X  Squats 20X ea  Lunges onto 4" step 20X no UE assist  4" step up forward 2X10 with 1 UE  4" lateral step up 2X10 with 1 UE  4" forward lunge without UE assist 2X10  [place] TKE Lt only 3PL 2X10 5" holds Hip abduction bilaterally 20X each  Hip extension bilaterally  20X each Sit to stands no UE 10X  03/06/23 Bike seat 8; 5 minutes Standing:  heel raises 20X  Squats 20X ea  Lunges onto 4" step 20X no UE assist  4" step up forward 2X10   4" lateral step up 2X10 Hip abduction bilaterally 20X each  Hip extension bilaterally 20X each Sit to stands no UE 10X  02/26/23 Progress note 2 MWT 272 ft with knee brace and no AD SLS 7" on left MMT's and AROM see above 5 x sit to stand 30.06 no UE assist (back pain) 4" steps with bilat UE assist reciprocal pattern  02/11/23: Standing:  Heel raise on incline x 20  Toe raises declined slope 20x Squat x 20  Lunge onto 4" step 20X each min UE assist  Forward step ups 4" 20X UE assist bilaterally  Lateral step ups 4" 20X UE assist bilaterally  Hip abduction bilaterally 20X each  Hip extension bilaterally 20X each Sit to stand 15x eccentric control no HHA  01/31/23: Heel raise x 15 Squat x 15 Toe raises declined slope 15x TKE GTB 10x 5" Sit to stand 15x eccentric control no HHA Lunge onto 4in step 15x Forward step up 4in 15 Lateral step up 4in 15x   01/20/23  Gait without AD x 226 ft.   Heel raise x 15 Squat x 15 Lt knee flexion 3# x 10 Lunge onto 4" box x 15 Step up 4" x 10   Lateral step up x 10  Wall squat x 10  Sit to stand x 15 no UE assist  Supine:   SLR x 10  Q-Set x 10 bridge x 15 Bike x 5 minutes   01/15/23 Heel raises x 15 Terminal extension x 10 Lunging onto 12" box x 5 Squat x 10 Lt knee flexion 3# x 10 Sitting : LAQ 3# x 15 Sit to stand x 10 no UE assist  Supine: SAQ 3# x 10 Quad set x 10 Heel slide x 10  SLR:  5   01/07/23 Standing:  heel raises 10X  Toe raises 10X  Lt knee flexion 10X Ambulation 200 feet with SPC Sitting:  LAQ 10X5"  Sit to stand 10X no UE Supine:  heelslides 10X  Quad sets 10X5"  SLR unable AROM 110 degrees flexion  Evaluation 01/06/23 Sitting:   LAQ x 10 Sit to stand x 5 Supine: Heel slide x 10 Quad set x 10  SLR x 5   Rt side  lying :  ABduction x 5  Prone: Knee flexion x 10    PATIENT EDUCATION:  Education details: HEp Person educated: Patient Education method: Chief Technology Officer Education comprehension: verbalized understanding and returned demonstration  HOME EXERCISE PROGRAM: Access Code: [email or web address] URL: [email or web address] Date: 01/06/2023 Prepared by: Virgina Organ  Exercises - Seated Long Arc Quad  - 3 x daily - 7 x weekly - 1 sets - 10 reps - 5" hold - Supine Quadricep Sets  - 3 x daily - 7 x weekly - 1 sets - 10 reps - 5" hold - Active Straight Leg Raise with Quad Set  - 2 x daily - 7 x weekly - 1 sets - 10 reps - 5" hold - Supine Heel Slide  - 2 x daily - 7 x weekly - 1 sets - 10 reps - 5" hold - Sit to Stand with Counter Support  - 3 x daily - 7 x weekly - 1 sets - 10 reps  ASSESSMENT:  CLINICAL IMPRESSION: Continues to ambulate without AD and brace donned on Lt knee. Completed session without brace on today. Continued to focus on improving functional strength for return to work.  Able to add lateral step ups with good eccentric control bilaterally.  Most difficulty maintaining static balance.  Vectors, 3 way kick added today to help improve this.   Cues to increase hold times, posturing and reducing UE assist with activities.  Intermittent back pain throughout treatment.  Patient will continue to  benefit from skilled therapy services to address  deficits an functional abilities.   Evaluation:  Patient is a 62 y.o. female  who was seen today for physical therapy evaluation and treatment for s/p Lt arthroscopic surgery.  Evaluation demonstrates decreased ROM, decreased strength, decreased balance, decreased activity tolerance and increased pain. Ms. Maren Reamer will benefit from skilled PT to address these deficits and maximize her functional ability  OBJECTIVE IMPAIRMENTS: Abnormal gait, decreased activity tolerance, decreased balance, decreased mobility, difficulty walking,  decreased ROM, increased edema, and pain.   ACTIVITY LIMITATIONS: carrying, lifting, bending, sitting, standing, stairs, and locomotion level  PARTICIPATION LIMITATIONS: cleaning, shopping, community activity, and occupation  PERSONAL FACTORS: Fitness are also affecting patient's functional outcome.   REHAB POTENTIAL: Good  CLINICAL DECISION MAKING: Stable/uncomplicated  EVALUATION COMPLEXITY: Low   GOALS: Goals reviewed with patient? Yes  SHORT TERM GOALS: Target date: 01/27/23 PT to be I in HEP in order to decrease her pain to no greater than a 4/10 Baseline: Goal status: IN PROGRESS  2.  PT core and LE strength increased to allow pt to be ambulating with a cane  Baseline:  Goal status: IN PROGRESS  3.  Pt ROM increased to 120 to be able to squat down.  Baseline: 115 left knee 9/18 Goal status: IN PROGRESS   LONG TERM GOALS: Target date: 03/26/2023  PT to be I in an advanced  HEP in order to decrease her pain to no greater than a 1/10 Baseline:  Goal status: IN PROGRESS   2.  PT core and LE strength increased to allow pt to be ambulating without an assistive device  Baseline:  Goal status: met   3.  PT to be able to go up and down 4 steps in a reciprocal manner  Baseline:  Goal status: IN PROGRESS   4.  PT to be able to be on her feet standing/walking for 2 hours at a time to be able to return to work  Baseline: has not returned to work yet Goal status: IN PROGRESS   PLAN:  PT FREQUENCY: 2x/week  PT DURATION: 6 weeks  PLANNED INTERVENTIONS: Therapeutic exercises, Therapeutic activity, Gait training, Patient/Family education, Self Care, Joint mobilization, and Manual therapy  PLAN FOR NEXT SESSION: Progress WB strengthening and stability.    10:02 AM, 03/20/23  Lurena Nida, PTA/CLT [facility] Ph: [phone]

## 2023-03-21 ENCOUNTER — Telehealth: Payer: Self-pay | Admitting: Orthopaedic Surgery

## 2023-03-24 ENCOUNTER — Other Ambulatory Visit: Payer: Self-pay | Admitting: Family Medicine

## 2023-03-24 DIAGNOSIS — G8929 Other chronic pain: Secondary | ICD-10-CM

## 2023-03-26 ENCOUNTER — Ambulatory Visit (HOSPITAL_COMMUNITY): Payer: 59

## 2023-03-26 DIAGNOSIS — M25562 Pain in left knee: Secondary | ICD-10-CM

## 2023-03-26 DIAGNOSIS — M25662 Stiffness of left knee, not elsewhere classified: Secondary | ICD-10-CM

## 2023-03-26 DIAGNOSIS — A048 Other specified bacterial intestinal infections: Secondary | ICD-10-CM | POA: Diagnosis not present

## 2023-03-26 DIAGNOSIS — M5459 Other low back pain: Secondary | ICD-10-CM | POA: Diagnosis not present

## 2023-03-26 DIAGNOSIS — M6281 Muscle weakness (generalized): Secondary | ICD-10-CM | POA: Diagnosis not present

## 2023-03-26 NOTE — Therapy (Signed)
OUTPATIENT PHYSICAL THERAPY TREATMENT/PROGRESS NOTE/DISCHARGE NOTE Progress Note Reporting Period 01/06/23 to 03/26/23  See note below for Objective Data and Assessment of Progress/Goals.   PHYSICAL THERAPY DISCHARGE SUMMARY  Visits from Start of Care: 11  Current functional level related to goals / functional outcomes: See below   Remaining deficits: See below   Education / Equipment: HEP   Patient agrees to discharge. Patient goals were partially met. Patient is being discharged due to being pleased with the current functional level.      Patient Name: Jacqueline [name] MRN: [id number] DOB:[birth year], 62 y.o., female Today's Date: 03/26/2023  END OF SESSION:  PT End of Session - 03/26/23 0936     Visit Number 12    Number of Visits 20    Date for PT Re-Evaluation 03/26/23    Authorization Type [employer]    PT Start Time [phone]    PT Stop Time 1015    PT Time Calculation (min) 39 min    Activity Tolerance Patient tolerated treatment well    Behavior During Therapy [facility] for tasks assessed/performed               Past Medical History:  Diagnosis Date   Anemia    Arthritis    per patient, back   Complication of anesthesia 2018   per patient HR dropped during partial hysterectomy surgery   Fibroid uterus    GERD (gastroesophageal reflux disease)    H/O total hysterectomy    Heart murmur    Hyperlipidemia    Borderline   Hypertension    Numbness and tingling in right hand    Pre-diabetes    borderline   Sleep apnea    Past Surgical History:  Procedure Laterality Date   BACK SURGERY     BALLOON DILATION N/A 05/22/2021   Procedure: BALLOON DILATION;  Surgeon: Lanelle Bal, DO;  [facility];  Service: Endoscopy;  Laterality: N/A;   BIOPSY  11/18/2022   Procedure: BIOPSY;  Surgeon: Corbin Ade, MD;  [facility];  Service: Endoscopy;;   CHOLECYSTECTOMY     COLONOSCOPY  2003   Dr. Jena Gauss: Anal canal hemorrhoids    COLONOSCOPY N/A 08/25/2014   Normal colonoscopy.  Next colonoscopy in 2026.  Procedure: COLONOSCOPY;  Surgeon: Corbin Ade, MD;  [facility];  Service: Endoscopy;  Laterality: N/A;  1200   ESOPHAGOGASTRODUODENOSCOPY N/A 08/25/2014   Procedure: ESOPHAGOGASTRODUODENOSCOPY (EGD);  Surgeon: Corbin Ade, MD;  [facility];  Service: Endoscopy;  Laterality: N/A;   ESOPHAGOGASTRODUODENOSCOPY (EGD) WITH PROPOFOL N/A 05/22/2021   Web and distal esophagus, dilated, gastriti, biopsy positive for mildly active H. pylori gastritis.  s procedure: ESOPHAGOGASTRODUODENOSCOPY (EGD) WITH PROPOFOL;  Surgeon: Lanelle Bal, DO;  [facility];  Service: Endoscopy;  Laterality: N/A;  9:15am   ESOPHAGOGASTRODUODENOSCOPY (EGD) WITH PROPOFOL N/A 11/18/2022   Procedure: ESOPHAGOGASTRODUODENOSCOPY (EGD) WITH PROPOFOL;  Surgeon: Corbin Ade, MD;  [facility];  Service: Endoscopy;  Laterality: N/A;  2:15 pm, asa 2   KNEE ARTHROSCOPY WITH MEDIAL MENISECTOMY Left 12/25/2022   Procedure: KNEE ARTHROSCOPY WITH PARTIAL LATERAL MENISCECTOMY DEBRIDEMENT ACL;  Surgeon: Vickki Hearing, MD;  Location: [facility];  Service: Orthopedics;  Laterality: Left;   MALONEY DILATION N/A 11/18/2022   Procedure: Elease Hashimoto DILATION;  Surgeon: Corbin Ade, MD;  [facility];  Service: Endoscopy;  Laterality: N/A;   SALPINGOOPHORECTOMY Bilateral 11/06/2016   Procedure: BILATERAL SALPINGO  OOPHORECTOMY;  Surgeon: Lazaro Arms, MD;  Location: [facility];  Service: Gynecology;  Laterality: Bilateral;   SUPRACERVICAL ABDOMINAL HYSTERECTOMY N/A 11/06/2016   Procedure: HYSTERECTOMY SUPRACERVICAL ABDOMINAL;  Surgeon: Lazaro Arms, MD;  Location: [facility];  Service: Gynecology;  Laterality: N/A;   TRANSFORAMINAL LUMBAR INTERBODY FUSION (TLIF) WITH PEDICLE SCREW FIXATION 2 LEVEL Left 07/28/2019   Procedure: LEFT-SIDED LUMBAR 4- 5, LUMBAR 5 -SACRUM1 TRANSFORAMINAL LUMBAR INTERBODY FUSION WITH  INSTRUMENTATION AND ALLOGRAFT;  Surgeon: Estill Bamberg, MD;  Location: [facility];  Service: Orthopedics;  Laterality: Left;   TUBAL LIGATION     Patient Active Problem List   Diagnosis Date Noted   S/P arthroscopy of left knee debridement of ACL/ lateral meniscectomy 12/25/22 01/02/2023   Bucket-handle tear of lateral meniscus of left knee as current injury 12/25/2022   Left anterior cruciate ligament tear 12/25/2022   Acute non-recurrent maxillary sinusitis 10/26/2022   Mucopurulent conjunctivitis of both eyes 10/26/2022   Acute bronchitis 10/25/2022   Maxillary sinusitis, acute 10/25/2022   Conjunctivitis 10/25/2022   Neck pain, chronic 08/28/2022   Sleep apnea 08/28/2022   Need for immunization against influenza 02/28/2022   Screening for cervical cancer 12/21/2021   H. pylori infection 12/05/2021   Snoring 11/23/2021   Insomnia 11/23/2021   Annual physical exam 10/16/2021   Dermatitis 10/16/2021   Need for varicella vaccine 10/16/2021   Diarrhea 07/05/2021   Stomach cramps 07/05/2021   Nausea & vomiting 07/05/2021   Encounter for vaccination 07/05/2021   Right foot pain 05/16/2021   Dysphagia 05/01/2021   Right shoulder pain 12/27/2020   Rash 12/13/2020   Chronic back pain 04/04/2020   History of back surgery 04/04/2020   Radiculopathy 07/28/2019   Borderline diabetes 12/25/2018   Hypertension 06/30/2018   S/P abdominal supracervical subtotal hysterectomy 11/06/2016   Trigger middle finger of right hand 04/24/2016   GERD (gastroesophageal reflux disease) 08/04/2014   Shoulder strain 07/26/2014   Degenerative arthritis of thumb 07/26/2014   Left knee pain 02/28/2014   Hip pain 04/15/2013   Epicondylitis, lateral (tennis elbow) 04/13/2013   Constipation 09/15/2012   Hyperlipidemia 11/28/2011   Obesity, Class I, BMI 30.0-34.9 (see actual BMI) 09/15/2011   ALLERGIC RHINITIS, SEASONAL 11/13/2007    PCP: Gilmore Laroche  REFERRING PROVIDER: Vickki Hearing,  MD  REFERRING DIAG: Diagnosis [phone] (ICD-10-CM) - S/P arthroscopy of left knee  THERAPY DIAG:  Left knee pain Muscle weakness Difficulty in walking  Rationale for Evaluation and Treatment: Rehabilitation  ONSET DATE: 12/25/22  Returns to MD 9/30  SUBJECTIVE STATEMENT:   Sees MD 11/5; returns to work 10/28 ; "maybe 70%" better; but have to be cautious with movements; no quick movements; 2/10 pain in right knee; her back is currently giving her more issue than her knee  Evaluation: Jacqueline [name] states that she had arthroscopic surgery on her knee on 12/25/22.  Pt states that she has been bending and trying to lift her leg up but it is sore.   PT is unable to states how long she can stand, walk or sit for states she just doesn't know.   *Nov 2017 originally fell on concrete at work and injured lower back  PERTINENT HISTORY: No chief complaint on file.   DOS: 12/25/22   POST OP PLAN  WB as tolerated SUTURES OUT IN A WEEK  AROM  BRACE playmaker brace for 6 weeks and PT for 6 weeks to emphasize quadricep strengthening and overall leg conditioning   Prognosis guarded.  Expect  lateral compartment degeneration and chondrosis        PRE-OPERATIVE DIAGNOSIS:  Torn medical meniscus left knee   POST-OPERATIVE DIAGNOSIS:  torn lateral meniscus and partial tear acl    PROCEDURE:  Procedure(s): LEFT KNEE ARTHROSCOPY WITH PARTIAL LATERAL MEDIAL MENISCECTOMY AND DEBRIDEMENT OF ACL        PAIN:  Are you having pain? Yes: NPRS scale: 2/10 Pain location: left knee Pain description: sore Aggravating factors: WB Relieving factors: elevating and putting ice on it.    PRECAUTIONS: BRACE playmaker brace for 6 weeks and PT for 6 weeks to emphasize quadricep strengthening and overall leg conditioning     WEIGHT BEARING RESTRICTIONS:  WBAT FALLS:  Has patient fallen in last 6 months? 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)  PLOF: Independent  PATIENT GOALS: walk better  NEXT MD VISIT: after therapy  OBJECTIVE:   COGNITION: Overall cognitive status: Within functional limits for tasks assessed      EDEMA:  Normal for post op   MUSCLE LENGTH: Hamstrings: Right 140 deg; Left 135 deg  PALPATION: tender  LOWER EXTREMITY ROM:  Active ROM Right eval Left eval left Left 01/20/23 Left 02/26/23 Left 03/26/23  Hip flexion        Hip extension        Hip abduction        Hip adduction        Hip internal rotation        Hip external rotation        Knee flexion  105 115 123 115 118  Knee extension  3 2 2  -5 -3 (lacking)  Ankle dorsiflexion        Ankle plantarflexion        Ankle inversion        Ankle eversion         (Blank rows = not tested)  LOWER EXTREMITY MMT:  MMT Right eval Left eval Left 02/26/23 Left 03/26/23  Hip flexion  2 3+ 4+  Hip extension    3-  Hip abduction  3-    Hip adduction      Hip internal rotation      Hip external rotation      Knee flexion  3+ 4+ 4+  Knee extension  3 4+ 5  Ankle dorsiflexion   5 5  Ankle plantarflexion      Ankle inversion      Ankle eversion       (Blank rows = not tested)    FUNCTIONAL TESTS: (with knee brace)     TODAY'S TREATMENT:  DATE:  03/26/23 Nustep seat 7 x 5' dynamic warm up Progress note 2 minute walk test: 294 sec no AD; with brace Single leg stance:  10" each leg 5 times Sit to stand:  23.84 sec  using upper extremities to assist AROM and MMT's: see above  03/20/23 Nustep 5 minutes level 4 Standing Heel raises on incline 2 x 10 Toe raises on decline 2 x 10 6" forward step ups bil LE 2 x 10 bil UE assist 6" lateral step ups  bil LE 2X10 bil UE assist Hip abduction 2 x 10 each Hip extension 2 x 10 each Leg press 3 plates 2 x 10 Vectors (3 way kick) 5X5" each with 1 UE assist  03/14/23 Bike seat 6 x 6' dynamic warm up  Standing: Heel raises on incline 2 x 10 Toe raises on decline 2 x 10 6" step ups 2 x 10 left leg leading; bilateral upper extremity support Hip abduction 2 x 10 each Hip extension 2 x 10 each Leg press 3 plates 2 x 10 Tandem stance 20" each  03/12/23 Bike seat 6; 6 minutes level 2 Standing:  heel raises 20X  Squats 20X ea  Lunges onto 4" step 20X no UE assist  4" step up forward 2X10 with 1 UE  4" lateral step up 2X10 with 1 UE  4" forward lunge without UE assist 2X10  [place] TKE Lt only 3PL 2X10 5" holds Hip abduction bilaterally 20X each  Hip extension bilaterally 20X each Sit to stands no UE 10X  03/06/23 Bike seat 8; 5 minutes Standing:  heel raises 20X  Squats 20X ea  Lunges onto 4" step 20X no UE assist  4" step up forward 2X10   4" lateral step up 2X10 Hip abduction bilaterally 20X each  Hip extension bilaterally 20X each Sit to stands no UE 10X  02/26/23 Progress note 2 MWT 272 ft with knee brace and no AD SLS 7" on left MMT's and AROM see above 5 x sit to stand 30.06 no UE assist (back pain) 4" steps with bilat UE assist reciprocal pattern  02/11/23: Standing:  Heel raise on incline x 20  Toe raises declined slope 20x Squat x 20  Lunge onto 4" step 20X each min UE assist  Forward step ups 4" 20X UE assist bilaterally  Lateral step ups 4" 20X UE assist bilaterally  Hip abduction bilaterally 20X each  Hip extension bilaterally 20X each Sit to stand 15x eccentric control no HHA  01/31/23: Heel raise x 15 Squat x 15 Toe raises declined slope 15x TKE GTB 10x 5" Sit to stand 15x eccentric control no HHA Lunge onto 4in step 15x Forward step up 4in 15 Lateral step up 4in 15x   01/20/23  Gait without AD x 226 ft.   Heel raise x 15 Squat x 15 Lt knee  flexion 3# x 10 Lunge onto 4" box x 15 Step up 4" x 10   Lateral step up x 10  Wall squat x 10  Sit to stand x 15 no UE assist  Supine:   SLR x 10  Q-Set x 10 bridge x 15 Bike x 5 minutes   01/15/23 Heel raises x 15 Terminal extension x 10 Lunging onto 12" box x 5 Squat x 10 Lt knee flexion 3# x 10 Sitting : LAQ 3# x 15 Sit to stand x 10 no UE assist  Supine: SAQ 3# x 10 Quad set x 10 Heel slide x 10  SLR:  5   01/07/23 Standing:  heel raises 10X  Toe raises 10X  Lt knee flexion 10X Ambulation 200 feet with SPC Sitting:  LAQ 10X5"  Sit to stand 10X no UE Supine:  heelslides 10X  Quad sets 10X5"  SLR unable AROM 110 degrees flexion  Evaluation 01/06/23 Sitting:   LAQ x 10 Sit to stand x 5 Supine: Heel slide x 10 Quad set x 10  SLR x 5   Rt side lying :  ABduction x 5  Prone: Knee flexion x 10    PATIENT EDUCATION:  Education details: HEp Person educated: Patient Education method: Chief Technology Officer Education comprehension: verbalized understanding and returned demonstration  HOME EXERCISE PROGRAM: Access Code: [email or web address] URL: [email or web address] Date: 01/06/2023 Prepared by: Virgina Organ  Exercises - Seated Long Arc Quad  - 3 x daily - 7 x weekly - 1 sets - 10 reps - 5" hold - Supine Quadricep Sets  - 3 x daily - 7 x weekly - 1 sets - 10 reps - 5" hold - Active Straight Leg Raise with Quad Set  - 2 x daily - 7 x weekly - 1 sets - 10 reps - 5" hold - Supine Heel Slide  - 2 x daily - 7 x weekly - 1 sets - 10 reps - 5" hold - Sit to Stand with Counter Support  - 3 x daily - 7 x weekly - 1 sets - 10 reps  ASSESSMENT:  CLINICAL IMPRESSION: Progress note today; mild improvement noted with MMT's and AROM; still has trouble with trying to descend steps with reciprocal pattern. Patient is agreeable to discharge at this time so she can be treated for her back   Evaluation:  Patient is a 62 y.o. female  who was seen today for  physical therapy evaluation and treatment for s/p Lt arthroscopic surgery.  Evaluation demonstrates decreased ROM, decreased strength, decreased balance, decreased activity tolerance and increased pain. Jacqueline [name] will benefit from skilled PT to address these deficits and maximize her functional ability  OBJECTIVE IMPAIRMENTS: Abnormal gait, decreased activity tolerance, decreased balance, decreased mobility, difficulty walking, decreased ROM, increased edema, and pain.   ACTIVITY LIMITATIONS: carrying, lifting, bending, sitting, standing, stairs, and locomotion level  PARTICIPATION LIMITATIONS: cleaning, shopping, community activity, and occupation  PERSONAL FACTORS: Fitness are also affecting patient's functional outcome.   REHAB POTENTIAL: Good  CLINICAL DECISION MAKING: Stable/uncomplicated  EVALUATION COMPLEXITY: Low   GOALS: Goals reviewed with patient? Yes  SHORT TERM GOALS: Target date: 01/27/23 PT to be I in HEP in order to decrease her pain to no greater than a 4/10 Baseline: Goal status: met  2.  PT core and LE strength increased to allow pt to be ambulating with a cane  Baseline:  Goal status: met  3.  Pt ROM increased to 120 to be able to squat down.  Baseline: 115 left knee 9/18; 10/16 118 Goal status: IN PROGRESS   LONG TERM GOALS: Target date: 03/26/2023  PT to be I in an advanced  HEP in order to decrease her pain to no greater than a 1/10 Baseline: 2/10 03/26/23 Goal status: IN PROGRESS   2.  PT core and LE strength increased to allow pt to be ambulating without an assistive device  Baseline:  Goal status: met   3.  PT to be able to go up and down 4 steps in a reciprocal manner  Baseline: partially met Goal status: IN PROGRESS   4.  PT to be  able to be on her feet standing/walking for 2 hours at a time to be able to return to work  Baseline: has not returned to work yet; has not attempted Goal status: IN PROGRESS   PLAN:  PT FREQUENCY:  2x/week  PT DURATION: 6 weeks  PLANNED INTERVENTIONS: Therapeutic exercises, Therapeutic activity, Gait training, Patient/Family education, Self Care, Joint mobilization, and Manual therapy  PLAN FOR NEXT SESSION: discharge   10:12 AM, 03/26/23 Ottavio Norem Small Raveen Wieseler MPT [facility] [place] [phone] [phone]

## 2023-03-27 LAB — H. PYLORI BREATH TEST: H pylori Breath Test: NEGATIVE

## 2023-03-31 ENCOUNTER — Encounter (HOSPITAL_COMMUNITY): Payer: Self-pay

## 2023-03-31 ENCOUNTER — Other Ambulatory Visit: Payer: Self-pay

## 2023-03-31 ENCOUNTER — Ambulatory Visit (HOSPITAL_COMMUNITY): Payer: 59

## 2023-03-31 ENCOUNTER — Telehealth: Payer: Self-pay | Admitting: Orthopedic Surgery

## 2023-03-31 DIAGNOSIS — M25662 Stiffness of left knee, not elsewhere classified: Secondary | ICD-10-CM | POA: Diagnosis not present

## 2023-03-31 DIAGNOSIS — M6281 Muscle weakness (generalized): Secondary | ICD-10-CM | POA: Diagnosis not present

## 2023-03-31 DIAGNOSIS — M25562 Pain in left knee: Secondary | ICD-10-CM | POA: Diagnosis not present

## 2023-03-31 DIAGNOSIS — M5459 Other low back pain: Secondary | ICD-10-CM | POA: Diagnosis not present

## 2023-03-31 DIAGNOSIS — R748 Abnormal levels of other serum enzymes: Secondary | ICD-10-CM | POA: Diagnosis not present

## 2023-03-31 NOTE — Therapy (Signed)
OUTPATIENT PHYSICAL THERAPY THORACOLUMBAR EVALUATION   Patient Name: Jacqueline [name] MRN: [id number] DOB:Dec 07, 1960, 62 y.o., female Today's Date: 03/31/2023  END OF SESSION:  PT End of Session - 03/31/23 1307     Visit Number 1    Date for PT Re-Evaluation 05/11/24    Authorization Type [employer]; [facility] Medicaid Secondary    Authorization Time Period seeking new auth    Authorization - Visit Number 1    PT Start Time 1151    PT Stop Time 1230    PT Time Calculation (min) 39 min             Past Medical History:  Diagnosis Date   Anemia    Arthritis    per patient, back   Complication of anesthesia 2018   per patient HR dropped during partial hysterectomy surgery   Fibroid uterus    GERD (gastroesophageal reflux disease)    H/O total hysterectomy    Heart murmur    Hyperlipidemia    Borderline   Hypertension    Numbness and tingling in right hand    Pre-diabetes    borderline   Sleep apnea    Past Surgical History:  Procedure Laterality Date   BACK SURGERY     BALLOON DILATION N/A 05/22/2021   Procedure: BALLOON DILATION;  Surgeon: Lanelle Bal, DO;  [facility];  Service: Endoscopy;  Laterality: N/A;   BIOPSY  11/18/2022   Procedure: BIOPSY;  Surgeon: Corbin Ade, MD;  [facility]: [facility];  Service: Endoscopy;;   CHOLECYSTECTOMY     COLONOSCOPY  2003   Dr. Jena Gauss: Anal canal hemorrhoids   COLONOSCOPY N/A 08/25/2014   Normal colonoscopy.  Next colonoscopy in 2026.  Procedure: COLONOSCOPY;  Surgeon: Corbin Ade, MD;  [facility]: [facility];  Service: Endoscopy;  Laterality: N/A;  1200   ESOPHAGOGASTRODUODENOSCOPY N/A 08/25/2014   Procedure: ESOPHAGOGASTRODUODENOSCOPY (EGD);  Surgeon: Corbin Ade, MD;  [facility]: [facility];  Service: Endoscopy;  Laterality: N/A;   ESOPHAGOGASTRODUODENOSCOPY (EGD) WITH PROPOFOL N/A 05/22/2021   Web and distal esophagus, dilated, gastriti, biopsy positive for mildly active H. pylori  gastritis.  s procedure: ESOPHAGOGASTRODUODENOSCOPY (EGD) WITH PROPOFOL;  Surgeon: Lanelle Bal, DO;  [facility];  Service: Endoscopy;  Laterality: N/A;  9:15am   ESOPHAGOGASTRODUODENOSCOPY (EGD) WITH PROPOFOL N/A 11/18/2022   Procedure: ESOPHAGOGASTRODUODENOSCOPY (EGD) WITH PROPOFOL;  Surgeon: Corbin Ade, MD;  [facility];  Service: Endoscopy;  Laterality: N/A;  2:15 pm, asa 2   KNEE ARTHROSCOPY WITH MEDIAL MENISECTOMY Left 12/25/2022   Procedure: KNEE ARTHROSCOPY WITH PARTIAL LATERAL MENISCECTOMY DEBRIDEMENT ACL;  Surgeon: Vickki Hearing, MD;  Location: [facility];  Service: Orthopedics;  Laterality: Left;   MALONEY DILATION N/A 11/18/2022   Procedure: Elease Hashimoto DILATION;  Surgeon: Corbin Ade, MD;  [facility]: [facility];  Service: Endoscopy;  Laterality: N/A;   SALPINGOOPHORECTOMY Bilateral 11/06/2016   Procedure: BILATERAL SALPINGO OOPHORECTOMY;  Surgeon: Lazaro Arms, MD;  Location: [facility];  Service: Gynecology;  Laterality: Bilateral;   SUPRACERVICAL ABDOMINAL HYSTERECTOMY N/A 11/06/2016   Procedure: HYSTERECTOMY SUPRACERVICAL ABDOMINAL;  Surgeon: Lazaro Arms, MD;  Location: [facility];  Service: Gynecology;  Laterality: N/A;   TRANSFORAMINAL LUMBAR INTERBODY FUSION (TLIF) WITH PEDICLE SCREW FIXATION 2 LEVEL Left 07/28/2019   Procedure: LEFT-SIDED LUMBAR 4- 5, LUMBAR 5 -SACRUM1 TRANSFORAMINAL LUMBAR INTERBODY FUSION WITH INSTRUMENTATION AND ALLOGRAFT;  Surgeon: Estill Bamberg, MD;  Location: [facility];  Service: Orthopedics;  Laterality: Left;   TUBAL LIGATION     Patient Active Problem List   Diagnosis Date Noted   S/P arthroscopy of left knee debridement of ACL/ lateral meniscectomy 12/25/22 01/02/2023   Bucket-handle tear of lateral meniscus of left knee as current injury 12/25/2022   Left anterior cruciate ligament tear 12/25/2022   Acute non-recurrent maxillary sinusitis 10/26/2022   Mucopurulent conjunctivitis of both eyes 10/26/2022   Acute  bronchitis 10/25/2022   Maxillary sinusitis, acute 10/25/2022   Conjunctivitis 10/25/2022   Neck pain, chronic 08/28/2022   Sleep apnea 08/28/2022   Need for immunization against influenza 02/28/2022   Screening for cervical cancer 12/21/2021   H. pylori infection 12/05/2021   Snoring 11/23/2021   Insomnia 11/23/2021   Annual physical exam 10/16/2021   Dermatitis 10/16/2021   Need for varicella vaccine 10/16/2021   Diarrhea 07/05/2021   Stomach cramps 07/05/2021   Nausea & vomiting 07/05/2021   Encounter for vaccination 07/05/2021   Right foot pain 05/16/2021   Dysphagia 05/01/2021   Right shoulder pain 12/27/2020   Rash 12/13/2020   Chronic back pain 04/04/2020   History of back surgery 04/04/2020   Radiculopathy 07/28/2019   Borderline diabetes 12/25/2018   Hypertension 06/30/2018   S/P abdominal supracervical subtotal hysterectomy 11/06/2016   Trigger middle finger of right hand 04/24/2016   GERD (gastroesophageal reflux disease) 08/04/2014   Shoulder strain 07/26/2014   Degenerative arthritis of thumb 07/26/2014   Left knee pain 02/28/2014   Hip pain 04/15/2013   Epicondylitis, lateral (tennis elbow) 04/13/2013   Constipation 09/15/2012   Hyperlipidemia 11/28/2011   Obesity, Class I, BMI 30.0-34.9 (see actual BMI) 09/15/2011   ALLERGIC RHINITIS, SEASONAL 11/13/2007    PCP: Gilmore Laroche, FNP  REFERRING PROVIDER: Estill Bamberg MD  REFERRING DIAG: M54.50 (ICD-10-CM) - Low back pain   Rationale for Evaluation and Treatment: Rehabilitation  THERAPY DIAG:  Other low back pain  Muscle weakness (generalized)  ONSET DATE: 2017  SUBJECTIVE:                                                                                                                                                                                           SUBJECTIVE STATEMENT: Pt fell November 1st 2017 on concrete floor. Had surgery on Feb 17th '21, Fusion for 'SL 5,1, whatever you wanna call  those numbers." 6 screws and two rods in back. Pt reporting that she thought after surgery wasn't going to have any more trouble but does and cannot tell what day it will bother me or not. Going back to MD for back pain. Reporting pressure and pain shooting into proximal LE when  sitting on the commode and wiping.   PERTINENT HISTORY:  Meniscus repair and torn ligament on left knee 12/25/22.   PAIN:  Are you having pain? Yes: NPRS scale: 6 today/10 Pain location: Points to SIJ and lumbar psine Pain description: "it just hurts"-sharp pain Aggravating factors: "I don't know, I can be in the bed and it just happens" Relieving factors: Prescribed pain meds and tactile pressure  PRECAUTIONS: None  RED FLAGS: None   WEIGHT BEARING RESTRICTIONS: No  FALLS:  Has patient fallen in last 6 months? No  LIVING ENVIRONMENT: Lives with: lives with their spouse Lives in: House/apartment Stairs: Yes: External: 5 steps; can reach both Has following equipment at home: Single point cane and Walker - 2 wheeled  OCCUPATION: Work part time since May 23rd  PLOF: Independent  PATIENT GOALS: get out of pain  NEXT MD VISIT: tomorrow  OBJECTIVE:  Note: Objective measures were completed at Evaluation unless otherwise noted.  DIAGNOSTIC FINDINGS:  CLINICAL DATA:  Lumbar fusion at L4-5 and L5-S1   EXAM: LUMBAR SPINE - 2-3 VIEW; DG C-ARM 1-60 MIN   COMPARISON:  Prior intraoperative film from earlier in the same day.   FLUOROSCOPY TIME:  Fluoroscopy Time:  1 minutes 31 seconds   Radiation Exposure Index (if provided by the fluoroscopic device): Not available   Number of Acquired Spot Images: 3   FINDINGS: Pedicle screws are noted at L4, L5 and S1 with posterior fixation. Interbody fusion at both levels is noted.   IMPRESSION: L4-5 and L5-S1 fusion.  PATIENT SURVEYS:  FOTO 49.4   COGNITION: Overall cognitive status: Within functional limits for tasks  assessed     SENSATION: WFL   POSTURE: rounded shoulders, forward head, and anterior pelvic tilt  PALPATION: Reported TTP, no noticeable reactions when palpating.   LUMBAR ROM:   AROM eval  Flexion Touch knees  Extension 15%  Right lateral flexion 20%  Left lateral flexion 20%  Right rotation 20%  Left rotation 20%   (Blank rows = not tested)  LOWER EXTREMITY ROM:     Active  Right eval Left eval  Hip flexion    Hip extension    Hip abduction    Hip adduction    Hip internal rotation    Hip external rotation    Knee flexion    Knee extension    Ankle dorsiflexion    Ankle plantarflexion    Ankle inversion    Ankle eversion     (Blank rows = not tested)  LOWER EXTREMITY MMT:    MMT Right eval Left eval  Hip flexion 3+ 3+  Hip extension 3+ 3+  Hip abduction    Hip adduction    Hip internal rotation    Hip external rotation    Knee flexion    Knee extension 4 4  Ankle dorsiflexion    Ankle plantarflexion    Ankle inversion    Ankle eversion     (Blank rows = not tested)   FUNCTIONAL TESTS:  30 seconds chair stand test: 5x 2 minute walk test: 284ft  GAIT: Distance walked: 225ft Assistive device utilized: None Level of assistance: Complete Independence Comments: Antalgic gait with RLE, bilateral trendelenberg, limited hip flexion bilaterally during swing  TODAY'S TREATMENT:  DATE:    03/31/2023 PT Evaluation, findings, POC    PATIENT EDUCATION:  Education details: Findings and HEP below.  Person educated: Patient Education method: Medical illustrator Education comprehension: verbalized understanding  HOME EXERCISE PROGRAM: Access Code: [email or web address] URL: [email or web address] Date: 03/31/2023 Prepared by: Starling Manns  Exercises - Supine Posterior Pelvic Tilt  - 1 x daily - 7 x weekly - 3  sets - 10 reps - Supine Bridge  - 1 x daily - 7 x weekly - 3 sets - 10 reps  ASSESSMENT:  CLINICAL IMPRESSION: Patient is a 62 y.o. female who was seen today for physical therapy evaluation and treatment for M54.50 (ICD-10-CM) - Low back pain. Pt demonstrating limitations in functional mobility,community ambulation access, activity tolerance in setting due to muscle weakness, ROM restrictions, and pain. Pt will benefit from skilled PT services to address functional deficits and improve QOL.   OBJECTIVE IMPAIRMENTS: Abnormal gait, decreased mobility, difficulty walking, decreased ROM, decreased strength, increased fascial restrictions, and pain.   ACTIVITY LIMITATIONS: carrying, lifting, bending, sitting, standing, squatting, sleeping, and locomotion level  PARTICIPATION LIMITATIONS: community activity, occupation, and yard work  PERSONAL FACTORS: Age, Behavior pattern, Past/current experiences, and Social background are also affecting patient's functional outcome.   REHAB POTENTIAL: Good  CLINICAL DECISION MAKING: Evolving/moderate complexity  EVALUATION COMPLEXITY: Moderate   GOALS: Goals reviewed with patient? No  SHORT TERM GOALS: Target date: 04/14/2023  Pt will be independent with HEP in order to demonstrate participation in Physical Therapy POC.  Baseline: Goal status: INITIAL  2.  Pt will report 4/10 pain with mobility in order to demonstrate improved pain with functional activities.  Baseline:  Goal status: INITIAL  LONG TERM GOALS: Target date: 05/12/2023  Pt will improve 30 second chair test by >3 reps in order to demonstrate improved functional strength to return to desired activities.  Baseline: 6x Goal status: INITIAL  2.  Pt will improve 2 MWT by >129ft in order to demonstrate improved functional ambulatory capacity in community setting.  Baseline: see objective Goal status: INITIAL  3.  Pt will improve FOTO score by >5 points in order to demonstrate improved  pain with functional goals and outcomes. Baseline: 49.4 Goal status: INITIAL  4.  Pt will report 3/10 pain with mobility in order to demonstrate reduced pain with overall functional status.  Baseline:  Goal status: INITIAL   PLAN:  PT FREQUENCY: 2x/week  PT DURATION: 6 weeks  PLANNED INTERVENTIONS: 97164- PT Re-evaluation, 97110-Therapeutic exercises, 97530- Therapeutic activity, 97112- Neuromuscular re-education, 97535- Self Care, 14782- Manual therapy, [phone]- Gait training, Balance training, and Joint mobilization.  PLAN FOR NEXT SESSION: Core and BLE strengthening, postural awareness.  Nelida Meuse PT, DPT Physical Therapist with [facility] 336 [phone] office   Nelida Meuse, PT 03/31/2023, 1:09 PM

## 2023-03-31 NOTE — Telephone Encounter (Signed)
Please advise if patient is to wear brace when returns to work on 04/07/23. Form has been received.Thank you.

## 2023-03-31 NOTE — Telephone Encounter (Signed)
Noted for [employer]. 

## 2023-03-31 NOTE — Telephone Encounter (Signed)
Yes, she should continue to wear the brace when at work, thank you.

## 2023-04-01 DIAGNOSIS — M545 Low back pain, unspecified: Secondary | ICD-10-CM | POA: Diagnosis not present

## 2023-04-01 LAB — HEPATIC FUNCTION PANEL
ALT: 294 [IU]/L — ABNORMAL HIGH (ref 0–32)
AST: 193 [IU]/L — ABNORMAL HIGH (ref 0–40)
Albumin: 4 g/dL (ref 3.8–4.9)
Alkaline Phosphatase: 110 [IU]/L (ref 44–121)
Bilirubin Total: 0.8 mg/dL (ref 0.0–1.2)
Bilirubin, Direct: 0.2 mg/dL (ref 0.00–0.40)
Total Protein: 6.6 g/dL (ref 6.0–8.5)

## 2023-04-01 LAB — GAMMA GT: GGT: 34 [IU]/L (ref 0–60)

## 2023-04-02 ENCOUNTER — Ambulatory Visit (HOSPITAL_COMMUNITY): Payer: 59 | Admitting: Physical Therapy

## 2023-04-02 DIAGNOSIS — M25562 Pain in left knee: Secondary | ICD-10-CM | POA: Diagnosis not present

## 2023-04-02 DIAGNOSIS — M5459 Other low back pain: Secondary | ICD-10-CM

## 2023-04-02 DIAGNOSIS — M6281 Muscle weakness (generalized): Secondary | ICD-10-CM | POA: Diagnosis not present

## 2023-04-02 DIAGNOSIS — M25662 Stiffness of left knee, not elsewhere classified: Secondary | ICD-10-CM | POA: Diagnosis not present

## 2023-04-02 NOTE — Therapy (Signed)
OUTPATIENT PHYSICAL THERAPY THORACOLUMBAR EVALUATION   Patient Name: Jacqueline [name] MRN: [id number] DOB:[birth year], 62 y.o., female Today's Date: 04/02/2023  END OF SESSION:  PT End of Session - 04/02/23 1146     Visit Number 2    Number of Visits 12    Date for PT Re-Evaluation 05/11/24    Authorization Type [employer]; [employer] Secondary    Authorization Time Period 12 visits approved 10/21-12/20    Authorization - Visit Number 1    Authorization - Number of Visits 12    Progress Note Due on Visit 10              Past Medical History:  Diagnosis Date   Anemia    Arthritis    per patient, back   Complication of anesthesia 2018   per patient HR dropped during partial hysterectomy surgery   Fibroid uterus    GERD (gastroesophageal reflux disease)    H/O total hysterectomy    Heart murmur    Hyperlipidemia    Borderline   Hypertension    Numbness and tingling in right hand    Pre-diabetes    borderline   Sleep apnea    Past Surgical History:  Procedure Laterality Date   BACK SURGERY     BALLOON DILATION N/A 05/22/2021   Procedure: BALLOON DILATION;  Surgeon: Lanelle Bal, DO;  [facility];  Service: Endoscopy;  Laterality: N/A;   BIOPSY  11/18/2022   Procedure: BIOPSY;  Surgeon: Corbin Ade, MD;  Location: [facility];  Service: Endoscopy;;   CHOLECYSTECTOMY     COLONOSCOPY  2003   Dr. Jena Gauss: Anal canal hemorrhoids   COLONOSCOPY N/A 08/25/2014   Normal colonoscopy.  Next colonoscopy in 2026.  Procedure: COLONOSCOPY;  Surgeon: Corbin Ade, MD;  Location: [facility];  Service: Endoscopy;  Laterality: N/A;  1200   ESOPHAGOGASTRODUODENOSCOPY N/A 08/25/2014   Procedure: ESOPHAGOGASTRODUODENOSCOPY (EGD);  Surgeon: Corbin Ade, MD;  [facility]: [facility];  Service: Endoscopy;  Laterality: N/A;   ESOPHAGOGASTRODUODENOSCOPY (EGD) WITH PROPOFOL N/A 05/22/2021   Web and distal esophagus, dilated, gastriti, biopsy positive  for mildly active H. pylori gastritis.  s procedure: ESOPHAGOGASTRODUODENOSCOPY (EGD) WITH PROPOFOL;  Surgeon: Lanelle Bal, DO;  [facility]: [facility];  Service: Endoscopy;  Laterality: N/A;  9:15am   ESOPHAGOGASTRODUODENOSCOPY (EGD) WITH PROPOFOL N/A 11/18/2022   Procedure: ESOPHAGOGASTRODUODENOSCOPY (EGD) WITH PROPOFOL;  Surgeon: Corbin Ade, MD;  [facility]: [facility];  Service: Endoscopy;  Laterality: N/A;  2:15 pm, asa 2   KNEE ARTHROSCOPY WITH MEDIAL MENISECTOMY Left 12/25/2022   Procedure: KNEE ARTHROSCOPY WITH PARTIAL LATERAL MENISCECTOMY DEBRIDEMENT ACL;  Surgeon: Vickki Hearing, MD;  Location: [facility];  Service: Orthopedics;  Laterality: Left;   MALONEY DILATION N/A 11/18/2022   Procedure: Elease Hashimoto DILATION;  Surgeon: Corbin Ade, MD;  [facility]: [facility];  Service: Endoscopy;  Laterality: N/A;   SALPINGOOPHORECTOMY Bilateral 11/06/2016   Procedure: BILATERAL SALPINGO OOPHORECTOMY;  Surgeon: Lazaro Arms, MD;  Location: [facility];  Service: Gynecology;  Laterality: Bilateral;   SUPRACERVICAL ABDOMINAL HYSTERECTOMY N/A 11/06/2016   Procedure: HYSTERECTOMY SUPRACERVICAL ABDOMINAL;  Surgeon: Lazaro Arms, MD;  Location: [facility];  Service: Gynecology;  Laterality: N/A;   TRANSFORAMINAL LUMBAR INTERBODY FUSION (TLIF) WITH PEDICLE SCREW FIXATION 2 LEVEL Left 07/28/2019   Procedure: LEFT-SIDED LUMBAR 4- 5, LUMBAR 5 -SACRUM1 TRANSFORAMINAL LUMBAR INTERBODY FUSION WITH INSTRUMENTATION AND ALLOGRAFT;  Surgeon: Estill Bamberg, MD;  Location: [facility];  Service: Orthopedics;  Laterality: Left;   TUBAL LIGATION     Patient Active Problem List   Diagnosis Date Noted   S/P arthroscopy of left knee debridement of ACL/ lateral meniscectomy 12/25/22 01/02/2023   Bucket-handle tear of lateral meniscus of left knee as current injury 12/25/2022   Left anterior cruciate ligament tear 12/25/2022   Acute non-recurrent maxillary sinusitis 10/26/2022   Mucopurulent conjunctivitis of both eyes  10/26/2022   Acute bronchitis 10/25/2022   Maxillary sinusitis, acute 10/25/2022   Conjunctivitis 10/25/2022   Neck pain, chronic 08/28/2022   Sleep apnea 08/28/2022   Need for immunization against influenza 02/28/2022   Screening for cervical cancer 12/21/2021   H. pylori infection 12/05/2021   Snoring 11/23/2021   Insomnia 11/23/2021   Annual physical exam 10/16/2021   Dermatitis 10/16/2021   Need for varicella vaccine 10/16/2021   Diarrhea 07/05/2021   Stomach cramps 07/05/2021   Nausea & vomiting 07/05/2021   Encounter for vaccination 07/05/2021   Right foot pain 05/16/2021   Dysphagia 05/01/2021   Right shoulder pain 12/27/2020   Rash 12/13/2020   Chronic back pain 04/04/2020   History of back surgery 04/04/2020   Radiculopathy 07/28/2019   Borderline diabetes 12/25/2018   Hypertension 06/30/2018   S/P abdominal supracervical subtotal hysterectomy 11/06/2016   Trigger middle finger of right hand 04/24/2016   GERD (gastroesophageal reflux disease) 08/04/2014   Shoulder strain 07/26/2014   Degenerative arthritis of thumb 07/26/2014   Left knee pain 02/28/2014   Hip pain 04/15/2013   Epicondylitis, lateral (tennis elbow) 04/13/2013   Constipation 09/15/2012   Hyperlipidemia 11/28/2011   Obesity, Class I, BMI 30.0-34.9 (see actual BMI) 09/15/2011   ALLERGIC RHINITIS, SEASONAL 11/13/2007    PCP: Gilmore Laroche, FNP  REFERRING PROVIDER: Estill Bamberg MD  REFERRING DIAG: M54.50 (ICD-10-CM) - Low back pain   Rationale for Evaluation and Treatment: Rehabilitation  THERAPY DIAG:  Other low back pain  Muscle weakness (generalized)  ONSET DATE: 2017  SUBJECTIVE:                                                                                                                                                                                           SUBJECTIVE STATEMENT: Pt reports 5/10 pain in her back currently.  Reports compliance with HEP, however not getting her  bottom up as high as she would like with the bridge.  Evaluation:  Pt fell November 1st 2017 on concrete floor. Had surgery on Feb 17th '21, Fusion for 'SL 5,1, whatever you wanna call those numbers." 6 screws and two rods in back. Pt reporting that she thought after surgery  wasn't going to have any more trouble but does and cannot tell what day it will bother me or not. Going back to MD for back pain. Reporting pressure and pain shooting into proximal LE when sitting on the commode and wiping.   PERTINENT HISTORY:  Meniscus repair and torn ligament on left knee 12/25/22.   PAIN:  Are you having pain? Yes: NPRS scale: 6 today/10 Pain location: Points to SIJ and lumbar psine Pain description: "it just hurts"-sharp pain Aggravating factors: "I don't know, I can be in the bed and it just happens" Relieving factors: Prescribed pain meds and tactile pressure  PRECAUTIONS: None  RED FLAGS: None   WEIGHT BEARING RESTRICTIONS: No  FALLS:  Has patient fallen in last 6 months? No  LIVING ENVIRONMENT: Lives with: lives with their spouse Lives in: House/apartment Stairs: Yes: External: 5 steps; can reach both Has following equipment at home: Single point cane and Walker - 2 wheeled  OCCUPATION: Work part time since May 23rd  PLOF: Independent  PATIENT GOALS: get out of pain  NEXT MD VISIT: tomorrow  OBJECTIVE:  Note: Objective measures were completed at Evaluation unless otherwise noted.  DIAGNOSTIC FINDINGS:  CLINICAL DATA:  Lumbar fusion at L4-5 and L5-S1   EXAM: LUMBAR SPINE - 2-3 VIEW; DG C-ARM 1-60 MIN   COMPARISON:  Prior intraoperative film from earlier in the same day.   FLUOROSCOPY TIME:  Fluoroscopy Time:  1 minutes 31 seconds   Radiation Exposure Index (if provided by the fluoroscopic device): Not available   Number of Acquired Spot Images: 3   FINDINGS: Pedicle screws are noted at L4, L5 and S1 with posterior fixation. Interbody fusion at both levels is  noted.   IMPRESSION: L4-5 and L5-S1 fusion.  PATIENT SURVEYS:  FOTO 49.4   COGNITION: Overall cognitive status: Within functional limits for tasks assessed     SENSATION: WFL   POSTURE: rounded shoulders, forward head, and anterior pelvic tilt  PALPATION: Reported TTP, no noticeable reactions when palpating.   LUMBAR ROM:   AROM eval  Flexion Touch knees  Extension 15%  Right lateral flexion 20%  Left lateral flexion 20%  Right rotation 20%  Left rotation 20%   (Blank rows = not tested)  LOWER EXTREMITY ROM:     Active  Right eval Left eval  Hip flexion    Hip extension    Hip abduction    Hip adduction    Hip internal rotation    Hip external rotation    Knee flexion    Knee extension    Ankle dorsiflexion    Ankle plantarflexion    Ankle inversion    Ankle eversion     (Blank rows = not tested)  LOWER EXTREMITY MMT:    MMT Right eval Left eval  Hip flexion 3+ 3+  Hip extension 3+ 3+  Hip abduction    Hip adduction    Hip internal rotation    Hip external rotation    Knee flexion    Knee extension 4 4  Ankle dorsiflexion    Ankle plantarflexion    Ankle inversion    Ankle eversion     (Blank rows = not tested)   FUNCTIONAL TESTS:  30 seconds chair stand test: 5x 2 minute walk test: 234ft  GAIT: Distance walked: 278ft Assistive device utilized: None Level of assistance: Complete Independence Comments: Antalgic gait with RLE, bilateral trendelenberg, limited hip flexion bilaterally during swing  TODAY'S TREATMENT:  DATE:   04/02/23 Goal review Supine:  bridge 10X  Posterior pelvic tilt 10X  SLR 10X each Side lying hip abduction 10X each  Clams 10X5" each Prone heelsqueeze 10X5"  Prone on elbows 1 minute  Press ups (attempted but UE too weak) Long sitting hamstring stretch 3X30" each  03/31/2023 PT  Evaluation, findings, POC    PATIENT EDUCATION:  Education details: Findings and HEP below.  Person educated: Patient Education method: Medical illustrator Education comprehension: verbalized understanding  HOME EXERCISE PROGRAM: Access Code: [email or web address] URL: [email or web address] Date: 03/31/2023 Prepared by: Starling Manns  Exercises - Supine Posterior Pelvic Tilt  - 1 x daily - 7 x weekly - 3 sets - 10 reps - Supine Bridge  - 1 x daily - 7 x weekly - 3 sets - 10 reps  ASSESSMENT:  CLINICAL IMPRESSION: Reviewed goals and POC moving forward. Pt able to recall her HEP and complete correctly.  Minimal rise with bridge due to weakness at this point.  Advanced with core stab and LE strengthening exercises to challenge weaker mm as tested at initial evaluation.  Pt unable to complete press up due to UE weakness, however reported improvement in symptoms with prone on elbows.  Long sitting hamstring stretch added with noted tightness bilaterally.  Did not update HEP this session.  No pain voiced during or at completion of session today.  Pt will continue to benefit from skilled PT services to address functional deficits and improve QOL.   OBJECTIVE IMPAIRMENTS: Abnormal gait, decreased mobility, difficulty walking, decreased ROM, decreased strength, increased fascial restrictions, and pain.   ACTIVITY LIMITATIONS: carrying, lifting, bending, sitting, standing, squatting, sleeping, and locomotion level  PARTICIPATION LIMITATIONS: community activity, occupation, and yard work  PERSONAL FACTORS: Age, Behavior pattern, Past/current experiences, and Social background are also affecting patient's functional outcome.   REHAB POTENTIAL: Good  CLINICAL DECISION MAKING: Evolving/moderate complexity  EVALUATION COMPLEXITY: Moderate   GOALS: Goals reviewed with patient? No  SHORT TERM GOALS: Target date: 04/14/2023  Pt will be independent with HEP in order to demonstrate  participation in Physical Therapy POC.  Baseline: Goal status: IN PROGRESS  2.  Pt will report 4/10 pain with mobility in order to demonstrate improved pain with functional activities.  Baseline:  Goal status: IN PROGRESS  LONG TERM GOALS: Target date: 05/12/2023  Pt will improve 30 second chair test by >3 reps in order to demonstrate improved functional strength to return to desired activities.  Baseline: 6x Goal status: IN PROGRESS  2.  Pt will improve 2 MWT by >162ft in order to demonstrate improved functional ambulatory capacity in community setting.  Baseline: see objective Goal status: IN PROGRESS  3.  Pt will improve FOTO score by >5 points in order to demonstrate improved pain with functional goals and outcomes. Baseline: 49.4 Goal status: IN PROGRESS  4.  Pt will report 3/10 pain with mobility in order to demonstrate reduced pain with overall functional status.  Baseline:  Goal status: IN PROGRESS   PLAN:  PT FREQUENCY: 2x/week  PT DURATION: 6 weeks  PLANNED INTERVENTIONS: [id number]- PT Re-evaluation, 97110-Therapeutic exercises, 97530- Therapeutic activity, 97112- Neuromuscular re-education, 97535- Self Care, 54098- Manual therapy, [phone]- Gait training, Balance training, and Joint mobilization.  PLAN FOR NEXT SESSION: Core and BLE strengthening, postural awareness.   Emeline Gins B, PTA 04/02/2023, 11:47 AM

## 2023-04-05 DIAGNOSIS — M545 Low back pain, unspecified: Secondary | ICD-10-CM | POA: Diagnosis not present

## 2023-04-07 ENCOUNTER — Telehealth: Payer: Self-pay

## 2023-04-07 ENCOUNTER — Ambulatory Visit (HOSPITAL_COMMUNITY): Payer: 59

## 2023-04-07 DIAGNOSIS — M5459 Other low back pain: Secondary | ICD-10-CM | POA: Diagnosis not present

## 2023-04-07 DIAGNOSIS — M6281 Muscle weakness (generalized): Secondary | ICD-10-CM

## 2023-04-07 DIAGNOSIS — M25662 Stiffness of left knee, not elsewhere classified: Secondary | ICD-10-CM | POA: Diagnosis not present

## 2023-04-07 DIAGNOSIS — M25562 Pain in left knee: Secondary | ICD-10-CM | POA: Diagnosis not present

## 2023-04-07 NOTE — Therapy (Signed)
OUTPATIENT PHYSICAL THERAPY THORACOLUMBAR TREATMENT   Patient Name: Jacqueline [name] MRN: [id number] DOB:23-Dec-1960, 62 y.o., female Today's Date: 04/07/2023  END OF SESSION:  PT End of Session - 04/07/23 1516     Visit Number 3    Number of Visits 12    Date for PT Re-Evaluation 05/11/24    Authorization Type [employer]; [employer] Secondary    Authorization Time Period 12 visits approved 10/21-12/20    Authorization - Visit Number 2    Authorization - Number of Visits 12    Progress Note Due on Visit 10    PT Start Time 1438    PT Stop Time 1516    PT Time Calculation (min) 38 min    Activity Tolerance Patient tolerated treatment well    Behavior During Therapy WFL for tasks assessed/performed               Past Medical History:  Diagnosis Date   Anemia    Arthritis    per patient, back   Complication of anesthesia 2018   per patient HR dropped during partial hysterectomy surgery   Fibroid uterus    GERD (gastroesophageal reflux disease)    H/O total hysterectomy    Heart murmur    Hyperlipidemia    Borderline   Hypertension    Numbness and tingling in right hand    Pre-diabetes    borderline   Sleep apnea    Past Surgical History:  Procedure Laterality Date   BACK SURGERY     BALLOON DILATION N/A 05/22/2021   Procedure: BALLOON DILATION;  Surgeon: Lanelle Bal, DO;  [facility];  Service: Endoscopy;  Laterality: N/A;   BIOPSY  11/18/2022   Procedure: BIOPSY;  Surgeon: Corbin Ade, MD;  [facility]: [facility];  Service: Endoscopy;;   CHOLECYSTECTOMY     COLONOSCOPY  2003   Dr. Jena Gauss: Anal canal hemorrhoids   COLONOSCOPY N/A 08/25/2014   Normal colonoscopy.  Next colonoscopy in 2026.  Procedure: COLONOSCOPY;  Surgeon: Corbin Ade, MD;  [facility]: [facility];  Service: Endoscopy;  Laterality: N/A;  1200   ESOPHAGOGASTRODUODENOSCOPY N/A 08/25/2014   Procedure: ESOPHAGOGASTRODUODENOSCOPY (EGD);  Surgeon: Corbin Ade, MD;  [facility]: [facility];  Service: Endoscopy;  Laterality: N/A;   ESOPHAGOGASTRODUODENOSCOPY (EGD) WITH PROPOFOL N/A 05/22/2021   Web and distal esophagus, dilated, gastriti, biopsy positive for mildly active H. pylori gastritis.  s procedure: ESOPHAGOGASTRODUODENOSCOPY (EGD) WITH PROPOFOL;  Surgeon: Lanelle Bal, DO;  [facility]: [facility];  Service: Endoscopy;  Laterality: N/A;  9:15am   ESOPHAGOGASTRODUODENOSCOPY (EGD) WITH PROPOFOL N/A 11/18/2022   Procedure: ESOPHAGOGASTRODUODENOSCOPY (EGD) WITH PROPOFOL;  Surgeon: Corbin Ade, MD;  [facility];  Service: Endoscopy;  Laterality: N/A;  2:15 pm, asa 2   KNEE ARTHROSCOPY WITH MEDIAL MENISECTOMY Left 12/25/2022   Procedure: KNEE ARTHROSCOPY WITH PARTIAL LATERAL MENISCECTOMY DEBRIDEMENT ACL;  Surgeon: Vickki Hearing, MD;  Location: [facility];  Service: Orthopedics;  Laterality: Left;   MALONEY DILATION N/A 11/18/2022   Procedure: Elease Hashimoto DILATION;  Surgeon: Corbin Ade, MD;  [facility]: [facility];  Service: Endoscopy;  Laterality: N/A;   SALPINGOOPHORECTOMY Bilateral 11/06/2016   Procedure: BILATERAL SALPINGO OOPHORECTOMY;  Surgeon: Lazaro Arms, MD;  Location: [facility];  Service: Gynecology;  Laterality: Bilateral;   SUPRACERVICAL ABDOMINAL HYSTERECTOMY N/A 11/06/2016   Procedure: HYSTERECTOMY SUPRACERVICAL ABDOMINAL;  Surgeon: Lazaro Arms, MD;  Location: [facility];  Service: Gynecology;  Laterality: N/A;   TRANSFORAMINAL LUMBAR INTERBODY FUSION (TLIF) WITH PEDICLE SCREW FIXATION 2 LEVEL Left 07/28/2019   Procedure: LEFT-SIDED LUMBAR 4- 5, LUMBAR 5 -SACRUM1 TRANSFORAMINAL LUMBAR INTERBODY FUSION WITH INSTRUMENTATION AND ALLOGRAFT;  Surgeon: Estill Bamberg, MD;  Location: [facility];  Service: Orthopedics;  Laterality: Left;   TUBAL LIGATION     Patient Active Problem List   Diagnosis Date Noted   S/P arthroscopy of left knee debridement of ACL/ lateral meniscectomy 12/25/22 01/02/2023   Bucket-handle tear of  lateral meniscus of left knee as current injury 12/25/2022   Left anterior cruciate ligament tear 12/25/2022   Acute non-recurrent maxillary sinusitis 10/26/2022   Mucopurulent conjunctivitis of both eyes 10/26/2022   Acute bronchitis 10/25/2022   Maxillary sinusitis, acute 10/25/2022   Conjunctivitis 10/25/2022   Neck pain, chronic 08/28/2022   Sleep apnea 08/28/2022   Need for immunization against influenza 02/28/2022   Screening for cervical cancer 12/21/2021   H. pylori infection 12/05/2021   Snoring 11/23/2021   Insomnia 11/23/2021   Annual physical exam 10/16/2021   Dermatitis 10/16/2021   Need for varicella vaccine 10/16/2021   Diarrhea 07/05/2021   Stomach cramps 07/05/2021   Nausea & vomiting 07/05/2021   Encounter for vaccination 07/05/2021   Right foot pain 05/16/2021   Dysphagia 05/01/2021   Right shoulder pain 12/27/2020   Rash 12/13/2020   Chronic back pain 04/04/2020   History of back surgery 04/04/2020   Radiculopathy 07/28/2019   Borderline diabetes 12/25/2018   Hypertension 06/30/2018   S/P abdominal supracervical subtotal hysterectomy 11/06/2016   Trigger middle finger of right hand 04/24/2016   GERD (gastroesophageal reflux disease) 08/04/2014   Shoulder strain 07/26/2014   Degenerative arthritis of thumb 07/26/2014   Left knee pain 02/28/2014   Hip pain 04/15/2013   Epicondylitis, lateral (tennis elbow) 04/13/2013   Constipation 09/15/2012   Hyperlipidemia 11/28/2011   Obesity, Class I, BMI 30.0-34.9 (see actual BMI) 09/15/2011   ALLERGIC RHINITIS, SEASONAL 11/13/2007    PCP: Gilmore Laroche, FNP  REFERRING PROVIDER: Estill Bamberg MD  REFERRING DIAG: M54.50 (ICD-10-CM) - Low back pain   Rationale for Evaluation and Treatment: Rehabilitation  THERAPY DIAG:  Other low back pain  Muscle weakness (generalized)  ONSET DATE: 2017  SUBJECTIVE:                                                                                                                                                                                            SUBJECTIVE STATEMENT:  Not too bad today pt reports, on/off @ 4/10. Marland Kitchen  PERTINENT HISTORY:  Meniscus repair and torn ligament on left knee 12/25/22.  PAIN:  Are you having pain? Yes: NPRS scale: 6 today/10 Pain location: Points to SIJ and lumbar psine Pain description: "it just hurts"-sharp pain Aggravating factors: "I don't know, I can be in the bed and it just happens" Relieving factors: Prescribed pain meds and tactile pressure  PRECAUTIONS: None  RED FLAGS: None   WEIGHT BEARING RESTRICTIONS: No  FALLS:  Has patient fallen in last 6 months? No  LIVING ENVIRONMENT: Lives with: lives with their spouse Lives in: House/apartment Stairs: Yes: External: 5 steps; can reach both Has following equipment at home: Single point cane and Walker - 2 wheeled  OCCUPATION: Work part time since May 23rd  PLOF: Independent  PATIENT GOALS: get out of pain  NEXT MD VISIT: tomorrow  OBJECTIVE:  Note: Objective measures were completed at Evaluation unless otherwise noted.  DIAGNOSTIC FINDINGS:  CLINICAL DATA:  Lumbar fusion at L4-5 and L5-S1   EXAM: LUMBAR SPINE - 2-3 VIEW; DG C-ARM 1-60 MIN   COMPARISON:  Prior intraoperative film from earlier in the same day.   FLUOROSCOPY TIME:  Fluoroscopy Time:  1 minutes 31 seconds   Radiation Exposure Index (if provided by the fluoroscopic device): Not available   Number of Acquired Spot Images: 3   FINDINGS: Pedicle screws are noted at L4, L5 and S1 with posterior fixation. Interbody fusion at both levels is noted.   IMPRESSION: L4-5 and L5-S1 fusion.  PATIENT SURVEYS:  FOTO 49.4   COGNITION: Overall cognitive status: Within functional limits for tasks assessed     SENSATION: WFL   POSTURE: rounded shoulders, forward head, and anterior pelvic tilt  PALPATION: Reported TTP, no noticeable reactions when palpating.   LUMBAR ROM:    AROM eval  Flexion Touch knees  Extension 15%  Right lateral flexion 20%  Left lateral flexion 20%  Right rotation 20%  Left rotation 20%   (Blank rows = not tested)  LOWER EXTREMITY ROM:     Active  Right eval Left eval  Hip flexion    Hip extension    Hip abduction    Hip adduction    Hip internal rotation    Hip external rotation    Knee flexion    Knee extension    Ankle dorsiflexion    Ankle plantarflexion    Ankle inversion    Ankle eversion     (Blank rows = not tested)  LOWER EXTREMITY MMT:    MMT Right eval Left eval  Hip flexion 3+ 3+  Hip extension 3+ 3+  Hip abduction    Hip adduction    Hip internal rotation    Hip external rotation    Knee flexion    Knee extension 4 4  Ankle dorsiflexion    Ankle plantarflexion    Ankle inversion    Ankle eversion     (Blank rows = not tested)   FUNCTIONAL TESTS:  30 seconds chair stand test: 5x 2 minute walk test: 250ft  GAIT: Distance walked: 228ft Assistive device utilized: None Level of assistance: Complete Independence Comments: Antalgic gait with RLE, bilateral trendelenberg, limited hip flexion bilaterally during swing  TODAY'S TREATMENT:  DATE:   04/07/2023  -10x10' posterior pelvic tilt holds -1 x 10 with 3' hold for supine bride with posterior pelvic tilt prior.  -Seated HS stretch 2 x 1' bilaterally -Seated ball rollouts anterior and laterally x 5 for 3' -Standing palloff press 1 x 5' with YTB -Attempted elevated deadlift with 5lb bar, poor carryover, terminated due to increased pain.  -Pelvic mobility on theraball for remainder of session. A/P and laterally.   04/02/23 Goal review Supine:  bridge 10X  Posterior pelvic tilt 10X  SLR 10X each Side lying hip abduction 10X each  Clams 10X5" each Prone heelsqueeze 10X5"  Prone on elbows 1 minute  Press ups  (attempted but UE too weak) Long sitting hamstring stretch 3X30" each  03/31/2023 PT Evaluation, findings, POC    PATIENT EDUCATION:  Education details: Findings and HEP below.  Person educated: Patient Education method: Medical illustrator Education comprehension: verbalized understanding  HOME EXERCISE PROGRAM: Access Code: [email or web address] URL: [email or web address] Date: 03/31/2023 Prepared by: Starling Manns  Exercises - Supine Posterior Pelvic Tilt  - 1 x daily - 7 x weekly - 3 sets - 10 reps - Supine Bridge  - 1 x daily - 7 x weekly - 3 sets - 10 reps -pelvic mobility on theraball at home- x daily -Lateral trunk flexion wth theraball - x daily ASSESSMENT:  CLINICAL IMPRESSION: Session initiated with continue HEP overview and performance of HEP. Good carryover. Session focusing on ROM in lumbar spine with painfree range. Movement patterns are excessively stiff and required consistent cuing for smooth, relax movement patterns. EOS pain reported was 5/10 after theraball work. Promote increased mobility throughout session. Poor lifting patterns due to stiffness and muscle weakness limit picking up grandchildren. Pt will continue to benefit from skilled PT services to address functional deficits and improve QOL.   OBJECTIVE IMPAIRMENTS: Abnormal gait, decreased mobility, difficulty walking, decreased ROM, decreased strength, increased fascial restrictions, and pain.   ACTIVITY LIMITATIONS: carrying, lifting, bending, sitting, standing, squatting, sleeping, and locomotion level  PARTICIPATION LIMITATIONS: community activity, occupation, and yard work  PERSONAL FACTORS: Age, Behavior pattern, Past/current experiences, and Social background are also affecting patient's functional outcome.   REHAB POTENTIAL: Good  CLINICAL DECISION MAKING: Evolving/moderate complexity  EVALUATION COMPLEXITY: Moderate   GOALS: Goals reviewed with patient? No  SHORT TERM  GOALS: Target date: 04/14/2023  Pt will be independent with HEP in order to demonstrate participation in Physical Therapy POC.  Baseline: Goal status: IN PROGRESS  2.  Pt will report 4/10 pain with mobility in order to demonstrate improved pain with functional activities.  Baseline:  Goal status: IN PROGRESS  LONG TERM GOALS: Target date: 05/12/2023  Pt will improve 30 second chair test by >3 reps in order to demonstrate improved functional strength to return to desired activities.  Baseline: 6x Goal status: IN PROGRESS  2.  Pt will improve 2 MWT by >122ft in order to demonstrate improved functional ambulatory capacity in community setting.  Baseline: see objective Goal status: IN PROGRESS  3.  Pt will improve FOTO score by >5 points in order to demonstrate improved pain with functional goals and outcomes. Baseline: 49.4 Goal status: IN PROGRESS  4.  Pt will report 3/10 pain with mobility in order to demonstrate reduced pain with overall functional status.  Baseline:  Goal status: IN PROGRESS   PLAN:  PT FREQUENCY: 2x/week  PT DURATION: 6 weeks  PLANNED INTERVENTIONS: [id number]- PT Re-evaluation, 97110-Therapeutic exercises, 97530- Therapeutic activity, [id number]- Neuromuscular re-education, 97535- Self Care, [id number]-  Manual therapy, [phone]- Gait training, Balance training, and Joint mobilization.  PLAN FOR NEXT SESSION: Core and BLE strengthening, postural awareness.  Nelida Meuse PT, DPT Physical Therapist with [facility] [phone] office.  Nelida Meuse, PT 04/07/2023, 3:17 PM

## 2023-04-07 NOTE — Telephone Encounter (Signed)
Patient called asking about a form being faxed to [place] for Dr. Romeo Apple to fill out. I told her that I didn't see anything, but if she wanted to check with [employer] I could give her the number. She asked for the number and will call them to see if they have the form.

## 2023-04-10 ENCOUNTER — Ambulatory Visit (HOSPITAL_COMMUNITY): Payer: 59

## 2023-04-10 ENCOUNTER — Encounter (HOSPITAL_COMMUNITY): Payer: Self-pay

## 2023-04-10 DIAGNOSIS — M6281 Muscle weakness (generalized): Secondary | ICD-10-CM

## 2023-04-10 DIAGNOSIS — M5459 Other low back pain: Secondary | ICD-10-CM

## 2023-04-10 DIAGNOSIS — M25562 Pain in left knee: Secondary | ICD-10-CM | POA: Diagnosis not present

## 2023-04-10 DIAGNOSIS — M25662 Stiffness of left knee, not elsewhere classified: Secondary | ICD-10-CM | POA: Diagnosis not present

## 2023-04-10 NOTE — Therapy (Signed)
OUTPATIENT PHYSICAL THERAPY THORACOLUMBAR TREATMENT   Patient Name: Jacqueline [name] MRN: [id number] DOB:08/12/1960, 62 y.o., female Today's Date: 04/10/2023  END OF SESSION:  PT End of Session - 04/10/23 1507     Visit Number 4    Number of Visits 12    Date for PT Re-Evaluation 05/11/24    Authorization Type [employer]; [employer] Secondary    Authorization Time Period 12 visits approved 10/21-12/20    Authorization - Visit Number 3    Authorization - Number of Visits 12    Progress Note Due on Visit 10    PT Start Time 1438    PT Stop Time 1515    PT Time Calculation (min) 37 min    Activity Tolerance Patient tolerated treatment well    Behavior During Therapy WFL for tasks assessed/performed             Past Medical History:  Diagnosis Date   Anemia    Arthritis    per patient, back   Complication of anesthesia 2018   per patient HR dropped during partial hysterectomy surgery   Fibroid uterus    GERD (gastroesophageal reflux disease)    H/O total hysterectomy    Heart murmur    Hyperlipidemia    Borderline   Hypertension    Numbness and tingling in right hand    Pre-diabetes    borderline   Sleep apnea    Past Surgical History:  Procedure Laterality Date   BACK SURGERY     BALLOON DILATION N/A 05/22/2021   Procedure: BALLOON DILATION;  Surgeon: Lanelle Bal, DO;  [facility];  Service: Endoscopy;  Laterality: N/A;   BIOPSY  11/18/2022   Procedure: BIOPSY;  Surgeon: Corbin Ade, MD;  [facility]: [facility];  Service: Endoscopy;;   CHOLECYSTECTOMY     COLONOSCOPY  2003   Dr. Jena Gauss: Anal canal hemorrhoids   COLONOSCOPY N/A 08/25/2014   Normal colonoscopy.  Next colonoscopy in 2026.  Procedure: COLONOSCOPY;  Surgeon: Corbin Ade, MD;  [facility]: [facility];  Service: Endoscopy;  Laterality: N/A;  1200   ESOPHAGOGASTRODUODENOSCOPY N/A 08/25/2014   Procedure: ESOPHAGOGASTRODUODENOSCOPY (EGD);  Surgeon: Corbin Ade,  MD;  [facility]: [facility];  Service: Endoscopy;  Laterality: N/A;   ESOPHAGOGASTRODUODENOSCOPY (EGD) WITH PROPOFOL N/A 05/22/2021   Web and distal esophagus, dilated, gastriti, biopsy positive for mildly active H. pylori gastritis.  s procedure: ESOPHAGOGASTRODUODENOSCOPY (EGD) WITH PROPOFOL;  Surgeon: Lanelle Bal, DO;  [facility]: [facility];  Service: Endoscopy;  Laterality: N/A;  9:15am   ESOPHAGOGASTRODUODENOSCOPY (EGD) WITH PROPOFOL N/A 11/18/2022   Procedure: ESOPHAGOGASTRODUODENOSCOPY (EGD) WITH PROPOFOL;  Surgeon: Corbin Ade, MD;  [facility];  Service: Endoscopy;  Laterality: N/A;  2:15 pm, asa 2   KNEE ARTHROSCOPY WITH MEDIAL MENISECTOMY Left 12/25/2022   Procedure: KNEE ARTHROSCOPY WITH PARTIAL LATERAL MENISCECTOMY DEBRIDEMENT ACL;  Surgeon: Vickki Hearing, MD;  Location: [facility];  Service: Orthopedics;  Laterality: Left;   MALONEY DILATION N/A 11/18/2022   Procedure: Elease Hashimoto DILATION;  Surgeon: Corbin Ade, MD;  [facility]: [facility];  Service: Endoscopy;  Laterality: N/A;   SALPINGOOPHORECTOMY Bilateral 11/06/2016   Procedure: BILATERAL SALPINGO OOPHORECTOMY;  Surgeon: Lazaro Arms, MD;  Location: [facility];  Service: Gynecology;  Laterality: Bilateral;   SUPRACERVICAL ABDOMINAL HYSTERECTOMY N/A 11/06/2016   Procedure: HYSTERECTOMY SUPRACERVICAL ABDOMINAL;  Surgeon: Lazaro Arms, MD;  Location: [facility];  Service: Gynecology;  Laterality: N/A;  TRANSFORAMINAL LUMBAR INTERBODY FUSION (TLIF) WITH PEDICLE SCREW FIXATION 2 LEVEL Left 07/28/2019   Procedure: LEFT-SIDED LUMBAR 4- 5, LUMBAR 5 -SACRUM1 TRANSFORAMINAL LUMBAR INTERBODY FUSION WITH INSTRUMENTATION AND ALLOGRAFT;  Surgeon: Estill Bamberg, MD;  Location: [facility];  Service: Orthopedics;  Laterality: Left;   TUBAL LIGATION     Patient Active Problem List   Diagnosis Date Noted   S/P arthroscopy of left knee debridement of ACL/ lateral meniscectomy 12/25/22 01/02/2023   Bucket-handle tear of lateral  meniscus of left knee as current injury 12/25/2022   Left anterior cruciate ligament tear 12/25/2022   Acute non-recurrent maxillary sinusitis 10/26/2022   Mucopurulent conjunctivitis of both eyes 10/26/2022   Acute bronchitis 10/25/2022   Maxillary sinusitis, acute 10/25/2022   Conjunctivitis 10/25/2022   Neck pain, chronic 08/28/2022   Sleep apnea 08/28/2022   Need for immunization against influenza 02/28/2022   Screening for cervical cancer 12/21/2021   H. pylori infection 12/05/2021   Snoring 11/23/2021   Insomnia 11/23/2021   Annual physical exam 10/16/2021   Dermatitis 10/16/2021   Need for varicella vaccine 10/16/2021   Diarrhea 07/05/2021   Stomach cramps 07/05/2021   Nausea & vomiting 07/05/2021   Encounter for vaccination 07/05/2021   Right foot pain 05/16/2021   Dysphagia 05/01/2021   Right shoulder pain 12/27/2020   Rash 12/13/2020   Chronic back pain 04/04/2020   History of back surgery 04/04/2020   Radiculopathy 07/28/2019   Borderline diabetes 12/25/2018   Hypertension 06/30/2018   S/P abdominal supracervical subtotal hysterectomy 11/06/2016   Trigger middle finger of right hand 04/24/2016   GERD (gastroesophageal reflux disease) 08/04/2014   Shoulder strain 07/26/2014   Degenerative arthritis of thumb 07/26/2014   Left knee pain 02/28/2014   Hip pain 04/15/2013   Epicondylitis, lateral (tennis elbow) 04/13/2013   Constipation 09/15/2012   Hyperlipidemia 11/28/2011   Obesity, Class I, BMI 30.0-34.9 (see actual BMI) 09/15/2011   ALLERGIC RHINITIS, SEASONAL 11/13/2007    PCP: Gilmore Laroche, FNP  REFERRING PROVIDER: Estill Bamberg MD  REFERRING DIAG: M54.50 (ICD-10-CM) - Low back pain   Rationale for Evaluation and Treatment: Rehabilitation  THERAPY DIAG:  Other low back pain  Muscle weakness (generalized)  ONSET DATE: 2017  SUBJECTIVE:                                                                                                                                                                                            SUBJECTIVE STATEMENT:  5/10, pt reports able to complete strengthening exercise but not mobility on theraball due to busy schedule. Marland Kitchen  PERTINENT HISTORY:  Meniscus repair and torn ligament on left  knee 12/25/22.   PAIN:  Are you having pain? Yes: NPRS scale: 6 today/10 Pain location: Points to SIJ and lumbar psine Pain description: "it just hurts"-sharp pain Aggravating factors: "I don't know, I can be in the bed and it just happens" Relieving factors: Prescribed pain meds and tactile pressure  PRECAUTIONS: None  RED FLAGS: None   WEIGHT BEARING RESTRICTIONS: No  FALLS:  Has patient fallen in last 6 months? No  LIVING ENVIRONMENT: Lives with: lives with their spouse Lives in: House/apartment Stairs: Yes: External: 5 steps; can reach both Has following equipment at home: Single point cane and Walker - 2 wheeled  OCCUPATION: Work part time since May 23rd  PLOF: Independent  PATIENT GOALS: get out of pain  NEXT MD VISIT: tomorrow  OBJECTIVE:  Note: Objective measures were completed at Evaluation unless otherwise noted.  DIAGNOSTIC FINDINGS:  CLINICAL DATA:  Lumbar fusion at L4-5 and L5-S1   EXAM: LUMBAR SPINE - 2-3 VIEW; DG C-ARM 1-60 MIN   COMPARISON:  Prior intraoperative film from earlier in the same day.   FLUOROSCOPY TIME:  Fluoroscopy Time:  1 minutes 31 seconds   Radiation Exposure Index (if provided by the fluoroscopic device): Not available   Number of Acquired Spot Images: 3   FINDINGS: Pedicle screws are noted at L4, L5 and S1 with posterior fixation. Interbody fusion at both levels is noted.   IMPRESSION: L4-5 and L5-S1 fusion.  PATIENT SURVEYS:  FOTO 49.4   COGNITION: Overall cognitive status: Within functional limits for tasks assessed     SENSATION: WFL   POSTURE: rounded shoulders, forward head, and anterior pelvic tilt  PALPATION: Reported TTP, no  noticeable reactions when palpating.   LUMBAR ROM:   AROM eval  Flexion Touch knees  Extension 15%  Right lateral flexion 20%  Left lateral flexion 20%  Right rotation 20%  Left rotation 20%   (Blank rows = not tested)  LOWER EXTREMITY ROM:     Active  Right eval Left eval  Hip flexion    Hip extension    Hip abduction    Hip adduction    Hip internal rotation    Hip external rotation    Knee flexion    Knee extension    Ankle dorsiflexion    Ankle plantarflexion    Ankle inversion    Ankle eversion     (Blank rows = not tested)  LOWER EXTREMITY MMT:    MMT Right eval Left eval  Hip flexion 3+ 3+  Hip extension 3+ 3+  Hip abduction    Hip adduction    Hip internal rotation    Hip external rotation    Knee flexion    Knee extension 4 4  Ankle dorsiflexion    Ankle plantarflexion    Ankle inversion    Ankle eversion     (Blank rows = not tested)   FUNCTIONAL TESTS:  30 seconds chair stand test: 5x 2 minute walk test: 269ft  GAIT: Distance walked: 267ft Assistive device utilized: None Level of assistance: Complete Independence Comments: Antalgic gait with RLE, bilateral trendelenberg, limited hip flexion bilaterally during swing  TODAY'S TREATMENT:  DATE:   04/10/2023  -Recumbet bike x 5' -Lateral flexion seated theraball rollouts bilaterally x 10 for 3' -Supine bridges with RTB around knees 2 x 10 with 3' isometric -Side lying clamshells with RTB 2 x 12 with 3' isometric -5x Elevated squats with tactile cue for hip hinge at ASIS   04/07/2023  -10x10' posterior pelvic tilt holds -1 x 10 with 3' hold for supine bride with posterior pelvic tilt prior.  -Seated HS stretch 2 x 1' bilaterally -Seated ball rollouts anterior and laterally x 5 for 3' -Standing palloff press 1 x 5' with YTB -Attempted elevated deadlift with 5lb  bar, poor carryover, terminated due to increased pain.  -Pelvic mobility on theraball for remainder of session. A/P and laterally.   04/02/23 Goal review Supine:  bridge 10X  Posterior pelvic tilt 10X  SLR 10X each Side lying hip abduction 10X each  Clams 10X5" each Prone heelsqueeze 10X5"  Prone on elbows 1 minute  Press ups (attempted but UE too weak) Long sitting hamstring stretch 3X30" each  03/31/2023 PT Evaluation, findings, POC    PATIENT EDUCATION:  Education details: Findings and HEP below.  Person educated: Patient Education method: Medical illustrator Education comprehension: verbalized understanding  HOME EXERCISE PROGRAM: Access Code: [email or web address] URL: [email or web address] Date: 04/10/2023 Prepared by: Starling Manns  Exercises - Supine Posterior Pelvic Tilt  - 1 x daily - 7 x weekly - 3 sets - 10 reps - Supine Bridge  - 1 x daily - 7 x weekly - 3 sets - 10 reps - Bent Knee Fallouts  - 1 x daily - 7 x weekly - 3 sets - 10 reps - Clamshell with Resistance  - 1 x daily - 7 x weekly - 3 sets - 10 reps -pelvic mobility on theraball at home- x daily -Lateral trunk flexion wth theraball - x daily ASSESSMENT:  CLINICAL IMPRESSION: Pt showing mild improvements in pelvic control during bridges today. Additing more pelvic control interventions for HEP as pt continues with poor control during functional activities. Pt's muscle weakness continues to limit function and mobility, gower sign continues with sit/stands throughout session. Pt with significant pain and bodily impairments per report in various extremities. Chronic pain patterns are present, limiting mobility and progressive loading. Continue to slowly load pt's tolerance. Pt will continue to benefit from skilled PT services to address functional deficits and improve QOL.   OBJECTIVE IMPAIRMENTS: Abnormal gait, decreased mobility, difficulty walking, decreased ROM, decreased strength, increased  fascial restrictions, and pain.   ACTIVITY LIMITATIONS: carrying, lifting, bending, sitting, standing, squatting, sleeping, and locomotion level  PARTICIPATION LIMITATIONS: community activity, occupation, and yard work  PERSONAL FACTORS: Age, Behavior pattern, Past/current experiences, and Social background are also affecting patient's functional outcome.   REHAB POTENTIAL: Good  CLINICAL DECISION MAKING: Evolving/moderate complexity  EVALUATION COMPLEXITY: Moderate   GOALS: Goals reviewed with patient? No  SHORT TERM GOALS: Target date: 04/14/2023  Pt will be independent with HEP in order to demonstrate participation in Physical Therapy POC.  Baseline: Goal status: IN PROGRESS  2.  Pt will report 4/10 pain with mobility in order to demonstrate improved pain with functional activities.  Baseline:  Goal status: IN PROGRESS  LONG TERM GOALS: Target date: 05/12/2023  Pt will improve 30 second chair test by >3 reps in order to demonstrate improved functional strength to return to desired activities.  Baseline: 6x Goal status: IN PROGRESS  2.  Pt will improve 2 MWT by >126ft in order to demonstrate improved functional  ambulatory capacity in community setting.  Baseline: see objective Goal status: IN PROGRESS  3.  Pt will improve FOTO score by >5 points in order to demonstrate improved pain with functional goals and outcomes. Baseline: 49.4 Goal status: IN PROGRESS  4.  Pt will report 3/10 pain with mobility in order to demonstrate reduced pain with overall functional status.  Baseline:  Goal status: IN PROGRESS   PLAN:  PT FREQUENCY: 2x/week  PT DURATION: 6 weeks  PLANNED INTERVENTIONS: [id number]- PT Re-evaluation, 97110-Therapeutic exercises, 97530- Therapeutic activity, 97112- Neuromuscular re-education, 97535- Self Care, 96045- Manual therapy, [phone]- Gait training, Balance training, and Joint mobilization.  PLAN FOR NEXT SESSION: Core and BLE strengthening, postural  awareness.  Nelida Meuse PT, DPT Physical Therapist with [facility] [phone] office.  Nelida Meuse, PT 04/10/2023, 4:07 PM

## 2023-04-11 DIAGNOSIS — Z419 Encounter for procedure for purposes other than remedying health state, unspecified: Secondary | ICD-10-CM | POA: Diagnosis not present

## 2023-04-16 ENCOUNTER — Ambulatory Visit (HOSPITAL_COMMUNITY): Payer: 59 | Attending: Internal Medicine

## 2023-04-16 ENCOUNTER — Encounter (HOSPITAL_COMMUNITY): Payer: Self-pay

## 2023-04-16 DIAGNOSIS — M6281 Muscle weakness (generalized): Secondary | ICD-10-CM | POA: Insufficient documentation

## 2023-04-16 DIAGNOSIS — M25562 Pain in left knee: Secondary | ICD-10-CM | POA: Diagnosis not present

## 2023-04-16 DIAGNOSIS — M25662 Stiffness of left knee, not elsewhere classified: Secondary | ICD-10-CM | POA: Insufficient documentation

## 2023-04-16 DIAGNOSIS — M5459 Other low back pain: Secondary | ICD-10-CM | POA: Insufficient documentation

## 2023-04-16 NOTE — Therapy (Signed)
OUTPATIENT PHYSICAL THERAPY THORACOLUMBAR TREATMENT   Patient Name: Jacqueline [name] MRN: [id number] DOB:06/12/1960, 62 y.o., female Today's Date: 04/16/2023  END OF SESSION:  PT End of Session - 04/16/23 1512     Visit Number 5    Number of Visits 12    Date for PT Re-Evaluation 05/11/24    Authorization Type [employer]; [employer] Secondary    Authorization Time Period 12 visits approved 10/21-12/20    Authorization - Visit Number 4    Authorization - Number of Visits 12    Progress Note Due on Visit 10    PT Start Time 1433    PT Stop Time 1512    PT Time Calculation (min) 39 min    Activity Tolerance Patient tolerated treatment well    Behavior During Therapy WFL for tasks assessed/performed              Past Medical History:  Diagnosis Date   Anemia    Arthritis    per patient, back   Complication of anesthesia 2018   per patient HR dropped during partial hysterectomy surgery   Fibroid uterus    GERD (gastroesophageal reflux disease)    H/O total hysterectomy    Heart murmur    Hyperlipidemia    Borderline   Hypertension    Numbness and tingling in right hand    Pre-diabetes    borderline   Sleep apnea    Past Surgical History:  Procedure Laterality Date   BACK SURGERY     BALLOON DILATION N/A 05/22/2021   Procedure: BALLOON DILATION;  Surgeon: Lanelle Bal, DO;  [facility];  Service: Endoscopy;  Laterality: N/A;   BIOPSY  11/18/2022   Procedure: BIOPSY;  Surgeon: Corbin Ade, MD;  Location: [facility];  Service: Endoscopy;;   CHOLECYSTECTOMY     COLONOSCOPY  2003   Dr. Jena Gauss: Anal canal hemorrhoids   COLONOSCOPY N/A 08/25/2014   Normal colonoscopy.  Next colonoscopy in 2026.  Procedure: COLONOSCOPY;  Surgeon: Corbin Ade, MD;  [facility]: [facility];  Service: Endoscopy;  Laterality: N/A;  1200   ESOPHAGOGASTRODUODENOSCOPY N/A 08/25/2014   Procedure: ESOPHAGOGASTRODUODENOSCOPY (EGD);  Surgeon: Corbin Ade,  MD;  [facility]: [facility];  Service: Endoscopy;  Laterality: N/A;   ESOPHAGOGASTRODUODENOSCOPY (EGD) WITH PROPOFOL N/A 05/22/2021   Web and distal esophagus, dilated, gastriti, biopsy positive for mildly active H. pylori gastritis.  s procedure: ESOPHAGOGASTRODUODENOSCOPY (EGD) WITH PROPOFOL;  Surgeon: Lanelle Bal, DO;  [facility]: [facility];  Service: Endoscopy;  Laterality: N/A;  9:15am   ESOPHAGOGASTRODUODENOSCOPY (EGD) WITH PROPOFOL N/A 11/18/2022   Procedure: ESOPHAGOGASTRODUODENOSCOPY (EGD) WITH PROPOFOL;  Surgeon: Corbin Ade, MD;  [facility];  Service: Endoscopy;  Laterality: N/A;  2:15 pm, asa 2   KNEE ARTHROSCOPY WITH MEDIAL MENISECTOMY Left 12/25/2022   Procedure: KNEE ARTHROSCOPY WITH PARTIAL LATERAL MENISCECTOMY DEBRIDEMENT ACL;  Surgeon: Vickki Hearing, MD;  Location: [facility];  Service: Orthopedics;  Laterality: Left;   MALONEY DILATION N/A 11/18/2022   Procedure: Elease Hashimoto DILATION;  Surgeon: Corbin Ade, MD;  [facility]: [facility];  Service: Endoscopy;  Laterality: N/A;   SALPINGOOPHORECTOMY Bilateral 11/06/2016   Procedure: BILATERAL SALPINGO OOPHORECTOMY;  Surgeon: Lazaro Arms, MD;  Location: [facility];  Service: Gynecology;  Laterality: Bilateral;   SUPRACERVICAL ABDOMINAL HYSTERECTOMY N/A 11/06/2016   Procedure: HYSTERECTOMY SUPRACERVICAL ABDOMINAL;  Surgeon: Lazaro Arms, MD;  Location: [facility];  Service: Gynecology;  Laterality:  N/A;   TRANSFORAMINAL LUMBAR INTERBODY FUSION (TLIF) WITH PEDICLE SCREW FIXATION 2 LEVEL Left 07/28/2019   Procedure: LEFT-SIDED LUMBAR 4- 5, LUMBAR 5 -SACRUM1 TRANSFORAMINAL LUMBAR INTERBODY FUSION WITH INSTRUMENTATION AND ALLOGRAFT;  Surgeon: Estill Bamberg, MD;  Location: [facility];  Service: Orthopedics;  Laterality: Left;   TUBAL LIGATION     Patient Active Problem List   Diagnosis Date Noted   S/P arthroscopy of left knee debridement of ACL/ lateral meniscectomy 12/25/22 01/02/2023   Bucket-handle tear of lateral  meniscus of left knee as current injury 12/25/2022   Left anterior cruciate ligament tear 12/25/2022   Acute non-recurrent maxillary sinusitis 10/26/2022   Mucopurulent conjunctivitis of both eyes 10/26/2022   Acute bronchitis 10/25/2022   Maxillary sinusitis, acute 10/25/2022   Conjunctivitis 10/25/2022   Neck pain, chronic 08/28/2022   Sleep apnea 08/28/2022   Need for immunization against influenza 02/28/2022   Screening for cervical cancer 12/21/2021   H. pylori infection 12/05/2021   Snoring 11/23/2021   Insomnia 11/23/2021   Annual physical exam 10/16/2021   Dermatitis 10/16/2021   Need for varicella vaccine 10/16/2021   Diarrhea 07/05/2021   Stomach cramps 07/05/2021   Nausea & vomiting 07/05/2021   Encounter for vaccination 07/05/2021   Right foot pain 05/16/2021   Dysphagia 05/01/2021   Right shoulder pain 12/27/2020   Rash 12/13/2020   Chronic back pain 04/04/2020   History of back surgery 04/04/2020   Radiculopathy 07/28/2019   Borderline diabetes 12/25/2018   Hypertension 06/30/2018   S/P abdominal supracervical subtotal hysterectomy 11/06/2016   Trigger middle finger of right hand 04/24/2016   GERD (gastroesophageal reflux disease) 08/04/2014   Shoulder strain 07/26/2014   Degenerative arthritis of thumb 07/26/2014   Left knee pain 02/28/2014   Hip pain 04/15/2013   Epicondylitis, lateral (tennis elbow) 04/13/2013   Constipation 09/15/2012   Hyperlipidemia 11/28/2011   Obesity, Class I, BMI 30.0-34.9 (see actual BMI) 09/15/2011   ALLERGIC RHINITIS, SEASONAL 11/13/2007    PCP: Gilmore Laroche, FNP  REFERRING PROVIDER: Estill Bamberg MD  REFERRING DIAG: M54.50 (ICD-10-CM) - Low back pain   Rationale for Evaluation and Treatment: Rehabilitation  THERAPY DIAG:  Other low back pain  Muscle weakness (generalized)  ONSET DATE: 2017  SUBJECTIVE:                                                                                                                                                                                            SUBJECTIVE STATEMENT: Pt reports that she has been grabbing her back more often and started back to work last Thursday. Working part time. 4/10 pain in R LB today.  PERTINENT HISTORY:  Meniscus repair and torn ligament on left knee 12/25/22.   PAIN:  Are you having pain? Yes: NPRS scale: 6 today/10 Pain location: Points to SIJ and lumbar psine Pain description: "it just hurts"-sharp pain Aggravating factors: "I don't know, I can be in the bed and it just happens" Relieving factors: Prescribed pain meds and tactile pressure  PRECAUTIONS: None  RED FLAGS: None   WEIGHT BEARING RESTRICTIONS: No  FALLS:  Has patient fallen in last 6 months? No  LIVING ENVIRONMENT: Lives with: lives with their spouse Lives in: House/apartment Stairs: Yes: External: 5 steps; can reach both Has following equipment at home: Single point cane and Walker - 2 wheeled  OCCUPATION: Work part time since May 23rd  PLOF: Independent  PATIENT GOALS: get out of pain  NEXT MD VISIT: tomorrow  OBJECTIVE:  Note: Objective measures were completed at Evaluation unless otherwise noted.  DIAGNOSTIC FINDINGS:  CLINICAL DATA:  Lumbar fusion at L4-5 and L5-S1   EXAM: LUMBAR SPINE - 2-3 VIEW; DG C-ARM 1-60 MIN   COMPARISON:  Prior intraoperative film from earlier in the same day.   FLUOROSCOPY TIME:  Fluoroscopy Time:  1 minutes 31 seconds   Radiation Exposure Index (if provided by the fluoroscopic device): Not available   Number of Acquired Spot Images: 3   FINDINGS: Pedicle screws are noted at L4, L5 and S1 with posterior fixation. Interbody fusion at both levels is noted.   IMPRESSION: L4-5 and L5-S1 fusion.  PATIENT SURVEYS:  FOTO 49.4   COGNITION: Overall cognitive status: Within functional limits for tasks assessed     SENSATION: WFL   POSTURE: rounded shoulders, forward head, and anterior pelvic  tilt  PALPATION: Reported TTP, no noticeable reactions when palpating.   LUMBAR ROM:   AROM eval  Flexion Touch knees  Extension 15%  Right lateral flexion 20%  Left lateral flexion 20%  Right rotation 20%  Left rotation 20%   (Blank rows = not tested)  LOWER EXTREMITY ROM:     Active  Right eval Left eval  Hip flexion    Hip extension    Hip abduction    Hip adduction    Hip internal rotation    Hip external rotation    Knee flexion    Knee extension    Ankle dorsiflexion    Ankle plantarflexion    Ankle inversion    Ankle eversion     (Blank rows = not tested)  LOWER EXTREMITY MMT:    MMT Right eval Left eval  Hip flexion 3+ 3+  Hip extension 3+ 3+  Hip abduction    Hip adduction    Hip internal rotation    Hip external rotation    Knee flexion    Knee extension 4 4  Ankle dorsiflexion    Ankle plantarflexion    Ankle inversion    Ankle eversion     (Blank rows = not tested)   FUNCTIONAL TESTS:  30 seconds chair stand test: 5x 2 minute walk test: 227ft  GAIT: Distance walked: 252ft Assistive device utilized: None Level of assistance: Complete Independence Comments: Antalgic gait with RLE, bilateral trendelenberg, limited hip flexion bilaterally during swing  TODAY'S TREATMENT:  DATE:   04/16/2023  -Nustep x 5; seat 6; arms 9 -Supine bridges with green theraband around knees 2 x 10 with 3' isometric -Side lying clamshells with green theraband 2 x 12 with 3' isometric -8x Elevated squats with tactile cue for hip hinge at ASIS. GTB at knees, 2k weighted ball under chin  -Seated anti-rotation isometric with GTB 2 x 5 bilaterally 3' isoemtric  04/10/2023  -Recumbet bike x 5' -Lateral flexion seated theraball rollouts bilaterally x 10 for 3' -Supine bridges with RTB around knees 2 x 10 with 3' isometric -Side lying  clamshells with RTB 2 x 12 with 3' isometric -5x Elevated squats with tactile cue for hip hinge at ASIS   04/07/2023  -10x10' posterior pelvic tilt holds -1 x 10 with 3' hold for supine bride with posterior pelvic tilt prior.  -Seated HS stretch 2 x 1' bilaterally -Seated ball rollouts anterior and laterally x 5 for 3' -Standing palloff press 1 x 5' with YTB -Attempted elevated deadlift with 5lb bar, poor carryover, terminated due to increased pain.  -Pelvic mobility on theraball for remainder of session. A/P and laterally.    PATIENT EDUCATION:  Education details: Findings and HEP below.  Person educated: Patient Education method: Medical illustrator Education comprehension: verbalized understanding  HOME EXERCISE PROGRAM: Access Code: [email or web address] URL: [email or web address] Date: 04/16/2023 Prepared by: Starling Manns  Exercises - Supine Posterior Pelvic Tilt  - 1 x daily - 7 x weekly - 3 sets - 10 reps - Supine Bridge  - 1 x daily - 7 x weekly - 3 sets - 10 reps - Bent Knee Fallouts  - 1 x daily - 7 x weekly - 3 sets - 10 reps - Clamshell with Resistance  - 1 x daily - 7 x weekly - 3 sets - 10 reps - Seated Anti-Rotation Press With Anchored Resistance  - 1 x daily - 7 x weekly - 3 sets - 10 reps  ASSESSMENT:  CLINICAL IMPRESSION: Pt tolerating session well. Progress resistance with same interventions this session. Observing better recall of movement patterns with reduced cues for compensation this session. Fatiguing appropriately as well. Continue to progress interventions and reduce pain awareness through discussion and interventions. Addition anti-rotation activities for further core stability.. Pt will continue to benefit from skilled PT services to address functional deficits and improve QOL.   OBJECTIVE IMPAIRMENTS: Abnormal gait, decreased mobility, difficulty walking, decreased ROM, decreased strength, increased fascial restrictions, and pain.    ACTIVITY LIMITATIONS: carrying, lifting, bending, sitting, standing, squatting, sleeping, and locomotion level  PARTICIPATION LIMITATIONS: community activity, occupation, and yard work  PERSONAL FACTORS: Age, Behavior pattern, Past/current experiences, and Social background are also affecting patient's functional outcome.   REHAB POTENTIAL: Good  CLINICAL DECISION MAKING: Evolving/moderate complexity  EVALUATION COMPLEXITY: Moderate   GOALS: Goals reviewed with patient? No  SHORT TERM GOALS: Target date: 04/14/2023  Pt will be independent with HEP in order to demonstrate participation in Physical Therapy POC.  Baseline: Goal status: IN PROGRESS  2.  Pt will report 4/10 pain with mobility in order to demonstrate improved pain with functional activities.  Baseline:  Goal status: IN PROGRESS  LONG TERM GOALS: Target date: 05/12/2023  Pt will improve 30 second chair test by >3 reps in order to demonstrate improved functional strength to return to desired activities.  Baseline: 6x Goal status: IN PROGRESS  2.  Pt will improve 2 MWT by >119ft in order to demonstrate improved functional ambulatory capacity in community setting.  Baseline:  see objective Goal status: IN PROGRESS  3.  Pt will improve FOTO score by >5 points in order to demonstrate improved pain with functional goals and outcomes. Baseline: 49.4 Goal status: IN PROGRESS  4.  Pt will report 3/10 pain with mobility in order to demonstrate reduced pain with overall functional status.  Baseline:  Goal status: IN PROGRESS   PLAN:  PT FREQUENCY: 2x/week  PT DURATION: 6 weeks  PLANNED INTERVENTIONS: 97164- PT Re-evaluation, 97110-Therapeutic exercises, 97530- Therapeutic activity, 97112- Neuromuscular re-education, 97535- Self Care, 32440- Manual therapy, [phone]- Gait training, Balance training, and Joint mobilization.  PLAN FOR NEXT SESSION: Core and BLE strengthening, postural awareness.  Nelida Meuse PT,  DPT Physical Therapist with [facility] [phone] office.  Nelida Meuse, PT 04/16/2023, 3:14 PM

## 2023-04-17 DIAGNOSIS — H25813 Combined forms of age-related cataract, bilateral: Secondary | ICD-10-CM | POA: Diagnosis not present

## 2023-04-17 DIAGNOSIS — E119 Type 2 diabetes mellitus without complications: Secondary | ICD-10-CM | POA: Diagnosis not present

## 2023-04-17 LAB — HM DIABETES EYE EXAM

## 2023-04-18 ENCOUNTER — Ambulatory Visit (HOSPITAL_COMMUNITY): Payer: 59

## 2023-04-18 ENCOUNTER — Encounter (HOSPITAL_COMMUNITY): Payer: Self-pay

## 2023-04-18 DIAGNOSIS — M25662 Stiffness of left knee, not elsewhere classified: Secondary | ICD-10-CM

## 2023-04-18 DIAGNOSIS — M6281 Muscle weakness (generalized): Secondary | ICD-10-CM | POA: Diagnosis not present

## 2023-04-18 DIAGNOSIS — M25562 Pain in left knee: Secondary | ICD-10-CM

## 2023-04-18 DIAGNOSIS — M5459 Other low back pain: Secondary | ICD-10-CM | POA: Diagnosis not present

## 2023-04-18 NOTE — Therapy (Signed)
OUTPATIENT PHYSICAL THERAPY THORACOLUMBAR TREATMENT   Patient Name: Jacqueline [name] MRN: [id number] DOB:1961/06/04, 62 y.o., female Today's Date: 04/18/2023  END OF SESSION:  PT End of Session - 04/18/23 1517     Visit Number 6    Number of Visits 12    Date for PT Re-Evaluation 05/11/24    Authorization Type [employer]; [employer] Secondary    Authorization Time Period 12 visits approved 10/21-12/20    Authorization - Number of Visits 12    Progress Note Due on Visit 10    PT Start Time 1517    PT Stop Time 1557    PT Time Calculation (min) 40 min    Activity Tolerance Patient tolerated treatment well    Behavior During Therapy WFL for tasks assessed/performed               Past Medical History:  Diagnosis Date   Anemia    Arthritis    per patient, back   Complication of anesthesia 2018   per patient HR dropped during partial hysterectomy surgery   Fibroid uterus    GERD (gastroesophageal reflux disease)    H/O total hysterectomy    Heart murmur    Hyperlipidemia    Borderline   Hypertension    Numbness and tingling in right hand    Pre-diabetes    borderline   Sleep apnea    Past Surgical History:  Procedure Laterality Date   BACK SURGERY     BALLOON DILATION N/A 05/22/2021   Procedure: BALLOON DILATION;  Surgeon: Lanelle Bal, DO;  [facility];  Service: Endoscopy;  Laterality: N/A;   BIOPSY  11/18/2022   Procedure: BIOPSY;  Surgeon: Corbin Ade, MD;  [facility]: [facility];  Service: Endoscopy;;   CHOLECYSTECTOMY     COLONOSCOPY  2003   Dr. Jena Gauss: Anal canal hemorrhoids   COLONOSCOPY N/A 08/25/2014   Normal colonoscopy.  Next colonoscopy in 2026.  Procedure: COLONOSCOPY;  Surgeon: Corbin Ade, MD;  [facility]: [facility];  Service: Endoscopy;  Laterality: N/A;  1200   ESOPHAGOGASTRODUODENOSCOPY N/A 08/25/2014   Procedure: ESOPHAGOGASTRODUODENOSCOPY (EGD);  Surgeon: Corbin Ade, MD;  [facility]: [facility];   Service: Endoscopy;  Laterality: N/A;   ESOPHAGOGASTRODUODENOSCOPY (EGD) WITH PROPOFOL N/A 05/22/2021   Web and distal esophagus, dilated, gastriti, biopsy positive for mildly active H. pylori gastritis.  s procedure: ESOPHAGOGASTRODUODENOSCOPY (EGD) WITH PROPOFOL;  Surgeon: Lanelle Bal, DO;  [facility];  Service: Endoscopy;  Laterality: N/A;  9:15am   ESOPHAGOGASTRODUODENOSCOPY (EGD) WITH PROPOFOL N/A 11/18/2022   Procedure: ESOPHAGOGASTRODUODENOSCOPY (EGD) WITH PROPOFOL;  Surgeon: Corbin Ade, MD;  [facility];  Service: Endoscopy;  Laterality: N/A;  2:15 pm, asa 2   KNEE ARTHROSCOPY WITH MEDIAL MENISECTOMY Left 12/25/2022   Procedure: KNEE ARTHROSCOPY WITH PARTIAL LATERAL MENISCECTOMY DEBRIDEMENT ACL;  Surgeon: Vickki Hearing, MD;  Location: [facility];  Service: Orthopedics;  Laterality: Left;   MALONEY DILATION N/A 11/18/2022   Procedure: Elease Hashimoto DILATION;  Surgeon: Corbin Ade, MD;  [facility]: [facility];  Service: Endoscopy;  Laterality: N/A;   SALPINGOOPHORECTOMY Bilateral 11/06/2016   Procedure: BILATERAL SALPINGO OOPHORECTOMY;  Surgeon: Lazaro Arms, MD;  Location: [facility];  Service: Gynecology;  Laterality: Bilateral;   SUPRACERVICAL ABDOMINAL HYSTERECTOMY N/A 11/06/2016   Procedure: HYSTERECTOMY SUPRACERVICAL ABDOMINAL;  Surgeon: Lazaro Arms, MD;  Location: [facility];  Service: Gynecology;  Laterality: N/A;   TRANSFORAMINAL LUMBAR INTERBODY FUSION (  TLIF) WITH PEDICLE SCREW FIXATION 2 LEVEL Left 07/28/2019   Procedure: LEFT-SIDED LUMBAR 4- 5, LUMBAR 5 -SACRUM1 TRANSFORAMINAL LUMBAR INTERBODY FUSION WITH INSTRUMENTATION AND ALLOGRAFT;  Surgeon: Estill Bamberg, MD;  Location: [facility];  Service: Orthopedics;  Laterality: Left;   TUBAL LIGATION     Patient Active Problem List   Diagnosis Date Noted   S/P arthroscopy of left knee debridement of ACL/ lateral meniscectomy 12/25/22 01/02/2023   Bucket-handle tear of lateral meniscus of left knee as current  injury 12/25/2022   Left anterior cruciate ligament tear 12/25/2022   Acute non-recurrent maxillary sinusitis 10/26/2022   Mucopurulent conjunctivitis of both eyes 10/26/2022   Acute bronchitis 10/25/2022   Maxillary sinusitis, acute 10/25/2022   Conjunctivitis 10/25/2022   Neck pain, chronic 08/28/2022   Sleep apnea 08/28/2022   Need for immunization against influenza 02/28/2022   Screening for cervical cancer 12/21/2021   H. pylori infection 12/05/2021   Snoring 11/23/2021   Insomnia 11/23/2021   Annual physical exam 10/16/2021   Dermatitis 10/16/2021   Need for varicella vaccine 10/16/2021   Diarrhea 07/05/2021   Stomach cramps 07/05/2021   Nausea & vomiting 07/05/2021   Encounter for vaccination 07/05/2021   Right foot pain 05/16/2021   Dysphagia 05/01/2021   Right shoulder pain 12/27/2020   Rash 12/13/2020   Chronic back pain 04/04/2020   History of back surgery 04/04/2020   Radiculopathy 07/28/2019   Borderline diabetes 12/25/2018   Hypertension 06/30/2018   S/P abdominal supracervical subtotal hysterectomy 11/06/2016   Trigger middle finger of right hand 04/24/2016   GERD (gastroesophageal reflux disease) 08/04/2014   Shoulder strain 07/26/2014   Degenerative arthritis of thumb 07/26/2014   Left knee pain 02/28/2014   Hip pain 04/15/2013   Epicondylitis, lateral (tennis elbow) 04/13/2013   Constipation 09/15/2012   Hyperlipidemia 11/28/2011   Obesity, Class I, BMI 30.0-34.9 (see actual BMI) 09/15/2011   ALLERGIC RHINITIS, SEASONAL 11/13/2007    PCP: Gilmore Laroche, FNP  REFERRING PROVIDER: Estill Bamberg MD  REFERRING DIAG: M54.50 (ICD-10-CM) - Low back pain   Rationale for Evaluation and Treatment: Rehabilitation  THERAPY DIAG:  Other low back pain  Muscle weakness (generalized)  Acute pain of left knee  Stiffness of left knee, not elsewhere classified  ONSET DATE: 2017  SUBJECTIVE:                                                                                                                                                                                            SUBJECTIVE STATEMENT:   Patient reports back has been feeling better today. Rates 3/10 pain in low back.   PERTINENT HISTORY:  Meniscus repair and torn ligament on left knee 12/25/22.   PAIN:  Are you having pain? Yes: NPRS scale: 6 today/10 Pain location: Points to SIJ and lumbar psine Pain description: "it just hurts"-sharp pain Aggravating factors: "I don't know, I can be in the bed and it just happens" Relieving factors: Prescribed pain meds and tactile pressure  PRECAUTIONS: None  RED FLAGS: None   WEIGHT BEARING RESTRICTIONS: No  FALLS:  Has patient fallen in last 6 months? No  LIVING ENVIRONMENT: Lives with: lives with their spouse Lives in: House/apartment Stairs: Yes: External: 5 steps; can reach both Has following equipment at home: Single point cane and Rosalee Tolley - 2 wheeled  OCCUPATION: Work part time since May 23rd  PLOF: Independent  PATIENT GOALS: get out of pain  NEXT MD VISIT: tomorrow  OBJECTIVE:  Note: Objective measures were completed at Evaluation unless otherwise noted.  DIAGNOSTIC FINDINGS:  CLINICAL DATA:  Lumbar fusion at L4-5 and L5-S1   EXAM: LUMBAR SPINE - 2-3 VIEW; DG C-ARM 1-60 MIN   COMPARISON:  Prior intraoperative film from earlier in the same day.   FLUOROSCOPY TIME:  Fluoroscopy Time:  1 minutes 31 seconds   Radiation Exposure Index (if provided by the fluoroscopic device): Not available   Number of Acquired Spot Images: 3   FINDINGS: Pedicle screws are noted at L4, L5 and S1 with posterior fixation. Interbody fusion at both levels is noted.   IMPRESSION: L4-5 and L5-S1 fusion.  PATIENT SURVEYS:  FOTO 49.4   COGNITION: Overall cognitive status: Within functional limits for tasks assessed     SENSATION: WFL   POSTURE: rounded shoulders, forward head, and anterior pelvic  tilt  PALPATION: Reported TTP, no noticeable reactions when palpating.   LUMBAR ROM:   AROM eval  Flexion Touch knees  Extension 15%  Right lateral flexion 20%  Left lateral flexion 20%  Right rotation 20%  Left rotation 20%   (Blank rows = not tested)  LOWER EXTREMITY ROM:     Active  Right eval Left eval  Hip flexion    Hip extension    Hip abduction    Hip adduction    Hip internal rotation    Hip external rotation    Knee flexion    Knee extension    Ankle dorsiflexion    Ankle plantarflexion    Ankle inversion    Ankle eversion     (Blank rows = not tested)  LOWER EXTREMITY MMT:    MMT Right eval Left eval  Hip flexion 3+ 3+  Hip extension 3+ 3+  Hip abduction    Hip adduction    Hip internal rotation    Hip external rotation    Knee flexion    Knee extension 4 4  Ankle dorsiflexion    Ankle plantarflexion    Ankle inversion    Ankle eversion     (Blank rows = not tested)   FUNCTIONAL TESTS:  30 seconds chair stand test: 5x 2 minute walk test: 22ft  GAIT: Distance walked: 225ft Assistive device utilized: None Level of assistance: Complete Independence Comments: Antalgic gait with RLE, bilateral trendelenberg, limited hip flexion bilaterally during swing  TODAY'S TREATMENT:  DATE:   04/18/2023: -Nustep x 5; seat 6; arms 9 level 3   -Supine bridges with green theraband around knees 2 x 10 with 3' isometric -Supine marching with GTB around knees 2 x 10 each LE  -Sidelying clamshells GTB 2 x 12 each side  -Supine TrA activation with use of red physioball 2 x 10 with 3 second hold -Supine TrA activation with alternating arm raise above head 2 x 10  - Seated paloff press with BTB x 10  -Seated row with BTB 2 x 10   04/16/2023  -Nustep x 5; seat 6; arms 9 -Supine bridges with green theraband around knees 2 x 10 with 3'  isometric -Side lying clamshells with green theraband 2 x 12 with 3' isometric -8x Elevated squats with tactile cue for hip hinge at ASIS. GTB at knees, 2k weighted ball under chin  -Seated anti-rotation isometric with GTB 2 x 5 bilaterally 3' isoemtric  04/10/2023  -Recumbet bike x 5' -Lateral flexion seated theraball rollouts bilaterally x 10 for 3' -Supine bridges with RTB around knees 2 x 10 with 3' isometric -Side lying clamshells with RTB 2 x 12 with 3' isometric -5x Elevated squats with tactile cue for hip hinge at ASIS   04/07/2023  -10x10' posterior pelvic tilt holds -1 x 10 with 3' hold for supine bride with posterior pelvic tilt prior.  -Seated HS stretch 2 x 1' bilaterally -Seated ball rollouts anterior and laterally x 5 for 3' -Standing palloff press 1 x 5' with YTB -Attempted elevated deadlift with 5lb bar, poor carryover, terminated due to increased pain.  -Pelvic mobility on theraball for remainder of session. A/P and laterally.    PATIENT EDUCATION:  Education details: Findings and HEP below.  Person educated: Patient Education method: Medical illustrator Education comprehension: verbalized understanding  HOME EXERCISE PROGRAM: Access Code: [email or web address] URL: [email or web address] Date: 04/16/2023 Prepared by: Starling Manns  Exercises - Supine Posterior Pelvic Tilt  - 1 x daily - 7 x weekly - 3 sets - 10 reps - Supine Bridge  - 1 x daily - 7 x weekly - 3 sets - 10 reps - Bent Knee Fallouts  - 1 x daily - 7 x weekly - 3 sets - 10 reps - Clamshell with Resistance  - 1 x daily - 7 x weekly - 3 sets - 10 reps - Seated Anti-Rotation Press With Anchored Resistance  - 1 x daily - 7 x weekly - 3 sets - 10 reps  ASSESSMENT:  CLINICAL IMPRESSION:    Patient arrives to treatment session motivated to participate with 3/10 pain. Session focused on core activation exercises with good tolerance. Fatiguing appropriately as well. Continue to progress  interventions and reduce pain awareness through discussion and interventions. Pt will continue to benefit from skilled PT services to address functional deficits and improve QOL.   OBJECTIVE IMPAIRMENTS: Abnormal gait, decreased mobility, difficulty walking, decreased ROM, decreased strength, increased fascial restrictions, and pain.   ACTIVITY LIMITATIONS: carrying, lifting, bending, sitting, standing, squatting, sleeping, and locomotion level  PARTICIPATION LIMITATIONS: community activity, occupation, and yard work  PERSONAL FACTORS: Age, Behavior pattern, Past/current experiences, and Social background are also affecting patient's functional outcome.   REHAB POTENTIAL: Good  CLINICAL DECISION MAKING: Evolving/moderate complexity  EVALUATION COMPLEXITY: Moderate   GOALS: Goals reviewed with patient? No  SHORT TERM GOALS: Target date: 04/14/2023  Pt will be independent with HEP in order to demonstrate participation in Physical Therapy POC.  Baseline: Goal status: IN PROGRESS  2.  Pt will report 4/10 pain with mobility in order to demonstrate improved pain with functional activities.  Baseline:  Goal status: IN PROGRESS  LONG TERM GOALS: Target date: 05/12/2023  Pt will improve 30 second chair test by >3 reps in order to demonstrate improved functional strength to return to desired activities.  Baseline: 6x Goal status: IN PROGRESS  2.  Pt will improve 2 MWT by >142ft in order to demonstrate improved functional ambulatory capacity in community setting.  Baseline: see objective Goal status: IN PROGRESS  3.  Pt will improve FOTO score by >5 points in order to demonstrate improved pain with functional goals and outcomes. Baseline: 49.4 Goal status: IN PROGRESS  4.  Pt will report 3/10 pain with mobility in order to demonstrate reduced pain with overall functional status.  Baseline:  Goal status: IN PROGRESS   PLAN:  PT FREQUENCY: 2x/week  PT DURATION: 6  weeks  PLANNED INTERVENTIONS: 97164- PT Re-evaluation, 97110-Therapeutic exercises, 97530- Therapeutic activity, 97112- Neuromuscular re-education, 97535- Self Care, [id number]- Manual therapy, [phone]- Gait training, Balance training, and Joint mobilization.  PLAN FOR NEXT SESSION: Core and BLE strengthening, postural awareness.  Maylon Peppers, PT, DPT  Physical Therapist with [facility] [phone] office.  Viviann Spare, PT 04/18/2023, 3:17 PM

## 2023-04-21 ENCOUNTER — Encounter: Payer: Self-pay | Admitting: Gastroenterology

## 2023-04-21 DIAGNOSIS — M48062 Spinal stenosis, lumbar region with neurogenic claudication: Secondary | ICD-10-CM | POA: Diagnosis not present

## 2023-04-21 DIAGNOSIS — M4696 Unspecified inflammatory spondylopathy, lumbar region: Secondary | ICD-10-CM | POA: Diagnosis not present

## 2023-04-21 NOTE — Therapy (Signed)
OUTPATIENT PHYSICAL THERAPY THORACOLUMBAR TREATMENT   Patient Name: Jacqueline [name] MRN: [id number] DOB:06/04/1961, 62 y.o., female Today's Date: 04/22/2023  END OF SESSION:  PT End of Session - 04/22/23 1517     Visit Number 7    Number of Visits 12    Date for PT Re-Evaluation 05/11/24    Authorization Type [employer]; [employer] Secondary    Authorization Time Period 12 visits approved 10/21-12/20    Authorization - Number of Visits 12    Progress Note Due on Visit 10    PT Start Time 1516    PT Stop Time 1556    PT Time Calculation (min) 40 min    Activity Tolerance Patient tolerated treatment well    Behavior During Therapy WFL for tasks assessed/performed                Past Medical History:  Diagnosis Date   Anemia    Arthritis    per patient, back   Complication of anesthesia 2018   per patient HR dropped during partial hysterectomy surgery   Fibroid uterus    GERD (gastroesophageal reflux disease)    H/O total hysterectomy    Heart murmur    Hyperlipidemia    Borderline   Hypertension    Numbness and tingling in right hand    Pre-diabetes    borderline   Sleep apnea    Past Surgical History:  Procedure Laterality Date   BACK SURGERY     BALLOON DILATION N/A 05/22/2021   Procedure: BALLOON DILATION;  Surgeon: Lanelle Bal, DO;  [facility]: [facility];  Service: Endoscopy;  Laterality: N/A;   BIOPSY  11/18/2022   Procedure: BIOPSY;  Surgeon: Corbin Ade, MD;  [facility]: [facility];  Service: Endoscopy;;   CHOLECYSTECTOMY     COLONOSCOPY  2003   Dr. Jena Gauss: Anal canal hemorrhoids   COLONOSCOPY N/A 08/25/2014   Normal colonoscopy.  Next colonoscopy in 2026.  Procedure: COLONOSCOPY;  Surgeon: Corbin Ade, MD;  [facility]: [facility];  Service: Endoscopy;  Laterality: N/A;  1200   ESOPHAGOGASTRODUODENOSCOPY N/A 08/25/2014   Procedure: ESOPHAGOGASTRODUODENOSCOPY (EGD);  Surgeon: Corbin Ade, MD;  [facility]: [facility];   Service: Endoscopy;  Laterality: N/A;   ESOPHAGOGASTRODUODENOSCOPY (EGD) WITH PROPOFOL N/A 05/22/2021   Web and distal esophagus, dilated, gastriti, biopsy positive for mildly active H. pylori gastritis.  s procedure: ESOPHAGOGASTRODUODENOSCOPY (EGD) WITH PROPOFOL;  Surgeon: Lanelle Bal, DO;  [facility]: [facility];  Service: Endoscopy;  Laterality: N/A;  9:15am   ESOPHAGOGASTRODUODENOSCOPY (EGD) WITH PROPOFOL N/A 11/18/2022   Procedure: ESOPHAGOGASTRODUODENOSCOPY (EGD) WITH PROPOFOL;  Surgeon: Corbin Ade, MD;  [facility];  Service: Endoscopy;  Laterality: N/A;  2:15 pm, asa 2   KNEE ARTHROSCOPY WITH MEDIAL MENISECTOMY Left 12/25/2022   Procedure: KNEE ARTHROSCOPY WITH PARTIAL LATERAL MENISCECTOMY DEBRIDEMENT ACL;  Surgeon: Vickki Hearing, MD;  Location: [facility];  Service: Orthopedics;  Laterality: Left;   MALONEY DILATION N/A 11/18/2022   Procedure: Elease Hashimoto DILATION;  Surgeon: Corbin Ade, MD;  [facility]: [facility];  Service: Endoscopy;  Laterality: N/A;   SALPINGOOPHORECTOMY Bilateral 11/06/2016   Procedure: BILATERAL SALPINGO OOPHORECTOMY;  Surgeon: Lazaro Arms, MD;  Location: [facility];  Service: Gynecology;  Laterality: Bilateral;   SUPRACERVICAL ABDOMINAL HYSTERECTOMY N/A 11/06/2016   Procedure: HYSTERECTOMY SUPRACERVICAL ABDOMINAL;  Surgeon: Lazaro Arms, MD;  Location: [facility];  Service: Gynecology;  Laterality: N/A;   TRANSFORAMINAL LUMBAR INTERBODY  FUSION (TLIF) WITH PEDICLE SCREW FIXATION 2 LEVEL Left 07/28/2019   Procedure: LEFT-SIDED LUMBAR 4- 5, LUMBAR 5 -SACRUM1 TRANSFORAMINAL LUMBAR INTERBODY FUSION WITH INSTRUMENTATION AND ALLOGRAFT;  Surgeon: Estill Bamberg, MD;  Location: [facility];  Service: Orthopedics;  Laterality: Left;   TUBAL LIGATION     Patient Active Problem List   Diagnosis Date Noted   S/P arthroscopy of left knee debridement of ACL/ lateral meniscectomy 12/25/22 01/02/2023   Bucket-handle tear of lateral meniscus of left knee as current  injury 12/25/2022   Left anterior cruciate ligament tear 12/25/2022   Acute non-recurrent maxillary sinusitis 10/26/2022   Mucopurulent conjunctivitis of both eyes 10/26/2022   Acute bronchitis 10/25/2022   Maxillary sinusitis, acute 10/25/2022   Conjunctivitis 10/25/2022   Neck pain, chronic 08/28/2022   Sleep apnea 08/28/2022   Need for immunization against influenza 02/28/2022   Screening for cervical cancer 12/21/2021   H. pylori infection 12/05/2021   Snoring 11/23/2021   Insomnia 11/23/2021   Annual physical exam 10/16/2021   Dermatitis 10/16/2021   Need for varicella vaccine 10/16/2021   Diarrhea 07/05/2021   Stomach cramps 07/05/2021   Nausea & vomiting 07/05/2021   Encounter for vaccination 07/05/2021   Right foot pain 05/16/2021   Dysphagia 05/01/2021   Right shoulder pain 12/27/2020   Rash 12/13/2020   Chronic back pain 04/04/2020   History of back surgery 04/04/2020   Radiculopathy 07/28/2019   Borderline diabetes 12/25/2018   Hypertension 06/30/2018   S/P abdominal supracervical subtotal hysterectomy 11/06/2016   Trigger middle finger of right hand 04/24/2016   GERD (gastroesophageal reflux disease) 08/04/2014   Shoulder strain 07/26/2014   Degenerative arthritis of thumb 07/26/2014   Left knee pain 02/28/2014   Hip pain 04/15/2013   Epicondylitis, lateral (tennis elbow) 04/13/2013   Constipation 09/15/2012   Hyperlipidemia 11/28/2011   Obesity, Class I, BMI 30.0-34.9 (see actual BMI) 09/15/2011   ALLERGIC RHINITIS, SEASONAL 11/13/2007    PCP: Gilmore Laroche, FNP  REFERRING PROVIDER: Estill Bamberg MD  REFERRING DIAG: M54.50 (ICD-10-CM) - Low back pain   Rationale for Evaluation and Treatment: Rehabilitation  THERAPY DIAG:  Other low back pain  Muscle weakness (generalized)  Acute pain of left knee  Stiffness of left knee, not elsewhere classified  ONSET DATE: 2017  SUBJECTIVE:                                                                                                                                                                                            SUBJECTIVE STATEMENT:    Patient reports 5-6/10 pain in her back. It has been bothering her over the weekend with  no MOI but has tried to continue her exercises. Trying to set up another injection.   PERTINENT HISTORY:  Meniscus repair and torn ligament on left knee 12/25/22.   PAIN:  Are you having pain? Yes: NPRS scale: 6 today/10 Pain location: Points to SIJ and lumbar psine Pain description: "it just hurts"-sharp pain Aggravating factors: "I don't know, I can be in the bed and it just happens" Relieving factors: Prescribed pain meds and tactile pressure  PRECAUTIONS: None  RED FLAGS: None   WEIGHT BEARING RESTRICTIONS: No  FALLS:  Has patient fallen in last 6 months? No  LIVING ENVIRONMENT: Lives with: lives with their spouse Lives in: House/apartment Stairs: Yes: External: 5 steps; can reach both Has following equipment at home: Single point cane and Lillian Tigges - 2 wheeled  OCCUPATION: Work part time since May 23rd  PLOF: Independent  PATIENT GOALS: get out of pain  NEXT MD VISIT: tomorrow  OBJECTIVE:  Note: Objective measures were completed at Evaluation unless otherwise noted.  DIAGNOSTIC FINDINGS:  CLINICAL DATA:  Lumbar fusion at L4-5 and L5-S1   EXAM: LUMBAR SPINE - 2-3 VIEW; DG C-ARM 1-60 MIN   COMPARISON:  Prior intraoperative film from earlier in the same day.   FLUOROSCOPY TIME:  Fluoroscopy Time:  1 minutes 31 seconds   Radiation Exposure Index (if provided by the fluoroscopic device): Not available   Number of Acquired Spot Images: 3   FINDINGS: Pedicle screws are noted at L4, L5 and S1 with posterior fixation. Interbody fusion at both levels is noted.   IMPRESSION: L4-5 and L5-S1 fusion.  PATIENT SURVEYS:  FOTO 49.4   COGNITION: Overall cognitive status: Within functional limits for tasks  assessed     SENSATION: WFL   POSTURE: rounded shoulders, forward head, and anterior pelvic tilt  PALPATION: Reported TTP, no noticeable reactions when palpating.   LUMBAR ROM:   AROM eval  Flexion Touch knees  Extension 15%  Right lateral flexion 20%  Left lateral flexion 20%  Right rotation 20%  Left rotation 20%   (Blank rows = not tested)  LOWER EXTREMITY ROM:     Active  Right eval Left eval  Hip flexion    Hip extension    Hip abduction    Hip adduction    Hip internal rotation    Hip external rotation    Knee flexion    Knee extension    Ankle dorsiflexion    Ankle plantarflexion    Ankle inversion    Ankle eversion     (Blank rows = not tested)  LOWER EXTREMITY MMT:    MMT Right eval Left eval  Hip flexion 3+ 3+  Hip extension 3+ 3+  Hip abduction    Hip adduction    Hip internal rotation    Hip external rotation    Knee flexion    Knee extension 4 4  Ankle dorsiflexion    Ankle plantarflexion    Ankle inversion    Ankle eversion     (Blank rows = not tested)   FUNCTIONAL TESTS:  30 seconds chair stand test: 5x 2 minute walk test: 281ft  GAIT: Distance walked: 287ft Assistive device utilized: None Level of assistance: Complete Independence Comments: Antalgic gait with RLE, bilateral trendelenberg, limited hip flexion bilaterally during swing  TODAY'S TREATMENT:  DATE:   04/22/23:   -Nustep x 6; seat 6; arms 9 level 3  -Supine bridges with green theraband around knees 2 x 10 with 3' isometric -Supine lower trunk rotation x 10 each side  -Supine marching with GTB around knees 2 x 10 each LE  -Supine hip abduction with GTB around knees 2 x 10  -standing rows with Black TB 2 x 10  -Standing shoulder extension 2 x 10 with green TB -Standing paloff press with Black TB x 10 each direction  MH applied to low back  during supine exercises    04/18/2023: -Nustep x 5; seat 6; arms 9 level 3   -Supine bridges with green theraband around knees 2 x 10 with 3' isometric -Supine marching with GTB around knees 2 x 10 each LE  -Sidelying clamshells GTB 2 x 12 each side  -Supine TrA activation with use of red physioball 2 x 10 with 3 second hold -Supine TrA activation with alternating arm raise above head 2 x 10  - Seated paloff press with BTB x 10  -Seated row with BTB 2 x 10   04/16/2023  -Nustep x 5; seat 6; arms 9 -Supine bridges with green theraband around knees 2 x 10 with 3' isometric -Side lying clamshells with green theraband 2 x 12 with 3' isometric -8x Elevated squats with tactile cue for hip hinge at ASIS. GTB at knees, 2k weighted ball under chin  -Seated anti-rotation isometric with GTB 2 x 5 bilaterally 3' isoemtric  04/10/2023  -Recumbet bike x 5' -Lateral flexion seated theraball rollouts bilaterally x 10 for 3' -Supine bridges with RTB around knees 2 x 10 with 3' isometric -Side lying clamshells with RTB 2 x 12 with 3' isometric -5x Elevated squats with tactile cue for hip hinge at ASIS   PATIENT EDUCATION:  Education details: Findings and HEP below.  Person educated: Patient Education method: Medical illustrator Education comprehension: verbalized understanding  HOME EXERCISE PROGRAM: Access Code: [email or web address] URL: [email or web address] Date: 04/16/2023 Prepared by: Starling Manns  Exercises - Supine Posterior Pelvic Tilt  - 1 x daily - 7 x weekly - 3 sets - 10 reps - Supine Bridge  - 1 x daily - 7 x weekly - 3 sets - 10 reps - Bent Knee Fallouts  - 1 x daily - 7 x weekly - 3 sets - 10 reps - Clamshell with Resistance  - 1 x daily - 7 x weekly - 3 sets - 10 reps - Seated Anti-Rotation Press With Anchored Resistance  - 1 x daily - 7 x weekly - 3 sets - 10 reps  ASSESSMENT:  CLINICAL IMPRESSION:   Patient arrives to treatment session motivated to  participate with 5-6/10 pain. Session focused on core activation exercises with good tolerance. Fatiguing appropriately as well. Continue to progress interventions and reduce pain awareness through discussion and interventions. Pt will continue to benefit from skilled PT services to address functional deficits and improve QOL.   OBJECTIVE IMPAIRMENTS: Abnormal gait, decreased mobility, difficulty walking, decreased ROM, decreased strength, increased fascial restrictions, and pain.   ACTIVITY LIMITATIONS: carrying, lifting, bending, sitting, standing, squatting, sleeping, and locomotion level  PARTICIPATION LIMITATIONS: community activity, occupation, and yard work  PERSONAL FACTORS: Age, Behavior pattern, Past/current experiences, and Social background are also affecting patient's functional outcome.   REHAB POTENTIAL: Good  CLINICAL DECISION MAKING: Evolving/moderate complexity  EVALUATION COMPLEXITY: Moderate   GOALS: Goals reviewed with patient? No  SHORT TERM GOALS: Target date: 04/14/2023  Pt  will be independent with HEP in order to demonstrate participation in Physical Therapy POC.  Baseline: Goal status: IN PROGRESS  2.  Pt will report 4/10 pain with mobility in order to demonstrate improved pain with functional activities.  Baseline:  Goal status: IN PROGRESS  LONG TERM GOALS: Target date: 05/12/2023  Pt will improve 30 second chair test by >3 reps in order to demonstrate improved functional strength to return to desired activities.  Baseline: 6x Goal status: IN PROGRESS  2.  Pt will improve 2 MWT by >129ft in order to demonstrate improved functional ambulatory capacity in community setting.  Baseline: see objective Goal status: IN PROGRESS  3.  Pt will improve FOTO score by >5 points in order to demonstrate improved pain with functional goals and outcomes. Baseline: 49.4 Goal status: IN PROGRESS  4.  Pt will report 3/10 pain with mobility in order to demonstrate  reduced pain with overall functional status.  Baseline:  Goal status: IN PROGRESS   PLAN:  PT FREQUENCY: 2x/week  PT DURATION: 6 weeks  PLANNED INTERVENTIONS: 97164- PT Re-evaluation, 97110-Therapeutic exercises, 97530- Therapeutic activity, 97112- Neuromuscular re-education, 97535- Self Care, 16109- Manual therapy, [phone]- Gait training, Balance training, and Joint mobilization.  PLAN FOR NEXT SESSION: Core and BLE strengthening, postural awareness.  Maylon Peppers, PT, DPT  Physical Therapist with [facility] [phone] office.  Viviann Spare, PT 04/22/2023, 3:57 PM

## 2023-04-22 ENCOUNTER — Ambulatory Visit (HOSPITAL_COMMUNITY): Payer: 59

## 2023-04-22 ENCOUNTER — Encounter (HOSPITAL_COMMUNITY): Payer: Self-pay

## 2023-04-22 DIAGNOSIS — M25662 Stiffness of left knee, not elsewhere classified: Secondary | ICD-10-CM | POA: Diagnosis not present

## 2023-04-22 DIAGNOSIS — M6281 Muscle weakness (generalized): Secondary | ICD-10-CM | POA: Diagnosis not present

## 2023-04-22 DIAGNOSIS — M5459 Other low back pain: Secondary | ICD-10-CM

## 2023-04-22 DIAGNOSIS — M25562 Pain in left knee: Secondary | ICD-10-CM | POA: Diagnosis not present

## 2023-04-23 ENCOUNTER — Other Ambulatory Visit: Payer: Self-pay | Admitting: Family Medicine

## 2023-04-23 DIAGNOSIS — I10 Essential (primary) hypertension: Secondary | ICD-10-CM

## 2023-04-23 DIAGNOSIS — G8929 Other chronic pain: Secondary | ICD-10-CM

## 2023-04-24 ENCOUNTER — Encounter (HOSPITAL_COMMUNITY): Payer: Self-pay

## 2023-04-24 ENCOUNTER — Ambulatory Visit (HOSPITAL_COMMUNITY): Payer: 59

## 2023-04-24 DIAGNOSIS — M25662 Stiffness of left knee, not elsewhere classified: Secondary | ICD-10-CM | POA: Diagnosis not present

## 2023-04-24 DIAGNOSIS — M6281 Muscle weakness (generalized): Secondary | ICD-10-CM

## 2023-04-24 DIAGNOSIS — M5459 Other low back pain: Secondary | ICD-10-CM

## 2023-04-24 DIAGNOSIS — M25562 Pain in left knee: Secondary | ICD-10-CM | POA: Diagnosis not present

## 2023-04-24 NOTE — Therapy (Signed)
OUTPATIENT PHYSICAL THERAPY THORACOLUMBAR TREATMENT   Patient Name: Jacqueline [name] MRN: [id number] DOB:22-May-1961, 62 y.o., female Today's Date: 04/24/2023  END OF SESSION:  PT End of Session - 04/24/23 1605     Visit Number 8    Number of Visits 12    Date for PT Re-Evaluation 05/11/24    Authorization Type [employer]; [facility] Medicaid Secondary    Authorization - Visit Number 5    Authorization - Number of Visits 12    Progress Note Due on Visit 10    PT Start Time 1517    PT Stop Time 1601    PT Time Calculation (min) 44 min                 Past Medical History:  Diagnosis Date   Anemia    Arthritis    per patient, back   Complication of anesthesia 2018   per patient HR dropped during partial hysterectomy surgery   Fibroid uterus    GERD (gastroesophageal reflux disease)    H/O total hysterectomy    Heart murmur    Hyperlipidemia    Borderline   Hypertension    Numbness and tingling in right hand    Pre-diabetes    borderline   Sleep apnea    Past Surgical History:  Procedure Laterality Date   BACK SURGERY     BALLOON DILATION N/A 05/22/2021   Procedure: BALLOON DILATION;  Surgeon: Lanelle Bal, DO;  [facility];  Service: Endoscopy;  Laterality: N/A;   BIOPSY  11/18/2022   Procedure: BIOPSY;  Surgeon: Corbin Ade, MD;  [facility];  Service: Endoscopy;;   CHOLECYSTECTOMY     COLONOSCOPY  2003   Dr. Jena Gauss: Anal canal hemorrhoids   COLONOSCOPY N/A 08/25/2014   Normal colonoscopy.  Next colonoscopy in 2026.  Procedure: COLONOSCOPY;  Surgeon: Corbin Ade, MD;  [facility];  Service: Endoscopy;  Laterality: N/A;  1200   ESOPHAGOGASTRODUODENOSCOPY N/A 08/25/2014   Procedure: ESOPHAGOGASTRODUODENOSCOPY (EGD);  Surgeon: Corbin Ade, MD;  [facility];  Service: Endoscopy;  Laterality: N/A;   ESOPHAGOGASTRODUODENOSCOPY (EGD) WITH PROPOFOL N/A 05/22/2021   Web and distal esophagus, dilated,  gastriti, biopsy positive for mildly active H. pylori gastritis.  s procedure: ESOPHAGOGASTRODUODENOSCOPY (EGD) WITH PROPOFOL;  Surgeon: Lanelle Bal, DO;  [facility];  Service: Endoscopy;  Laterality: N/A;  9:15am   ESOPHAGOGASTRODUODENOSCOPY (EGD) WITH PROPOFOL N/A 11/18/2022   Procedure: ESOPHAGOGASTRODUODENOSCOPY (EGD) WITH PROPOFOL;  Surgeon: Corbin Ade, MD;  [facility];  Service: Endoscopy;  Laterality: N/A;  2:15 pm, asa 2   KNEE ARTHROSCOPY WITH MEDIAL MENISECTOMY Left 12/25/2022   Procedure: KNEE ARTHROSCOPY WITH PARTIAL LATERAL MENISCECTOMY DEBRIDEMENT ACL;  Surgeon: Vickki Hearing, MD;  Location: [facility];  Service: Orthopedics;  Laterality: Left;   MALONEY DILATION N/A 11/18/2022   Procedure: Elease Hashimoto DILATION;  Surgeon: Corbin Ade, MD;  [facility];  Service: Endoscopy;  Laterality: N/A;   SALPINGOOPHORECTOMY Bilateral 11/06/2016   Procedure: BILATERAL SALPINGO OOPHORECTOMY;  Surgeon: Lazaro Arms, MD;  Location: [facility];  Service: Gynecology;  Laterality: Bilateral;   SUPRACERVICAL ABDOMINAL HYSTERECTOMY N/A 11/06/2016   Procedure: HYSTERECTOMY SUPRACERVICAL ABDOMINAL;  Surgeon: Lazaro Arms, MD;  Location: [facility];  Service: Gynecology;  Laterality: N/A;   TRANSFORAMINAL LUMBAR INTERBODY FUSION (TLIF) WITH PEDICLE SCREW FIXATION 2 LEVEL Left 07/28/2019   Procedure: LEFT-SIDED LUMBAR 4- 5, LUMBAR 5 -SACRUM1  TRANSFORAMINAL LUMBAR INTERBODY FUSION WITH INSTRUMENTATION AND ALLOGRAFT;  Surgeon: Estill Bamberg, MD;  Location: [facility];  Service: Orthopedics;  Laterality: Left;   TUBAL LIGATION     Patient Active Problem List   Diagnosis Date Noted   S/P arthroscopy of left knee debridement of ACL/ lateral meniscectomy 12/25/22 01/02/2023   Bucket-handle tear of lateral meniscus of left knee as current injury 12/25/2022   Left anterior cruciate ligament tear 12/25/2022   Acute non-recurrent maxillary sinusitis 10/26/2022   Mucopurulent  conjunctivitis of both eyes 10/26/2022   Acute bronchitis 10/25/2022   Maxillary sinusitis, acute 10/25/2022   Conjunctivitis 10/25/2022   Neck pain, chronic 08/28/2022   Sleep apnea 08/28/2022   Need for immunization against influenza 02/28/2022   Screening for cervical cancer 12/21/2021   H. pylori infection 12/05/2021   Snoring 11/23/2021   Insomnia 11/23/2021   Annual physical exam 10/16/2021   Dermatitis 10/16/2021   Need for varicella vaccine 10/16/2021   Diarrhea 07/05/2021   Stomach cramps 07/05/2021   Nausea & vomiting 07/05/2021   Encounter for vaccination 07/05/2021   Right foot pain 05/16/2021   Dysphagia 05/01/2021   Right shoulder pain 12/27/2020   Rash 12/13/2020   Chronic back pain 04/04/2020   History of back surgery 04/04/2020   Radiculopathy 07/28/2019   Borderline diabetes 12/25/2018   Hypertension 06/30/2018   S/P abdominal supracervical subtotal hysterectomy 11/06/2016   Trigger middle finger of right hand 04/24/2016   GERD (gastroesophageal reflux disease) 08/04/2014   Shoulder strain 07/26/2014   Degenerative arthritis of thumb 07/26/2014   Left knee pain 02/28/2014   Hip pain 04/15/2013   Epicondylitis, lateral (tennis elbow) 04/13/2013   Constipation 09/15/2012   Hyperlipidemia 11/28/2011   Obesity, Class I, BMI 30.0-34.9 (see actual BMI) 09/15/2011   ALLERGIC RHINITIS, SEASONAL 11/13/2007    PCP: Gilmore Laroche, FNP  REFERRING PROVIDER: Estill Bamberg MD  REFERRING DIAG: M54.50 (ICD-10-CM) - Low back pain   Rationale for Evaluation and Treatment: Rehabilitation  THERAPY DIAG:  Other low back pain  Muscle weakness (generalized)  ONSET DATE: 2017  SUBJECTIVE:                                                                                                                                                                                           SUBJECTIVE STATEMENT:    Pt reporting small little blister over right foot, but overall  feeling okay. Pt reports liking what other therapist has completed with her so far. Marland Kitchen   PERTINENT HISTORY:  Meniscus repair and torn ligament on left knee 12/25/22.   PAIN:  Are you having pain? Yes: NPRS scale:  6 today/10 Pain location: Points to SIJ and lumbar psine Pain description: "it just hurts"-sharp pain Aggravating factors: "I don't know, I can be in the bed and it just happens" Relieving factors: Prescribed pain meds and tactile pressure  PRECAUTIONS: None  RED FLAGS: None   WEIGHT BEARING RESTRICTIONS: No  FALLS:  Has patient fallen in last 6 months? No  LIVING ENVIRONMENT: Lives with: lives with their spouse Lives in: House/apartment Stairs: Yes: External: 5 steps; can reach both Has following equipment at home: Single point cane and Walker - 2 wheeled  OCCUPATION: Work part time since May 23rd  PLOF: Independent  PATIENT GOALS: get out of pain  NEXT MD VISIT: tomorrow  OBJECTIVE:  Note: Objective measures were completed at Evaluation unless otherwise noted.  DIAGNOSTIC FINDINGS:  CLINICAL DATA:  Lumbar fusion at L4-5 and L5-S1   EXAM: LUMBAR SPINE - 2-3 VIEW; DG C-ARM 1-60 MIN   COMPARISON:  Prior intraoperative film from earlier in the same day.   FLUOROSCOPY TIME:  Fluoroscopy Time:  1 minutes 31 seconds   Radiation Exposure Index (if provided by the fluoroscopic device): Not available   Number of Acquired Spot Images: 3   FINDINGS: Pedicle screws are noted at L4, L5 and S1 with posterior fixation. Interbody fusion at both levels is noted.   IMPRESSION: L4-5 and L5-S1 fusion.  PATIENT SURVEYS:  FOTO 49.4   COGNITION: Overall cognitive status: Within functional limits for tasks assessed     SENSATION: WFL   POSTURE: rounded shoulders, forward head, and anterior pelvic tilt  PALPATION: Reported TTP, no noticeable reactions when palpating.   LUMBAR ROM:   AROM eval  Flexion Touch knees  Extension 15%  Right lateral flexion  20%  Left lateral flexion 20%  Right rotation 20%  Left rotation 20%   (Blank rows = not tested)  LOWER EXTREMITY ROM:     Active  Right eval Left eval  Hip flexion    Hip extension    Hip abduction    Hip adduction    Hip internal rotation    Hip external rotation    Knee flexion    Knee extension    Ankle dorsiflexion    Ankle plantarflexion    Ankle inversion    Ankle eversion     (Blank rows = not tested)  LOWER EXTREMITY MMT:    MMT Right eval Left eval  Hip flexion 3+ 3+  Hip extension 3+ 3+  Hip abduction    Hip adduction    Hip internal rotation    Hip external rotation    Knee flexion    Knee extension 4 4  Ankle dorsiflexion    Ankle plantarflexion    Ankle inversion    Ankle eversion     (Blank rows = not tested)   FUNCTIONAL TESTS:  30 seconds chair stand test: 5x 2 minute walk test: 219ft  GAIT: Distance walked: 274ft Assistive device utilized: None Level of assistance: Complete Independence Comments: Antalgic gait with RLE, bilateral trendelenberg, limited hip flexion bilaterally during swing  TODAY'S TREATMENT:  DATE:   04/24/2023  -Nustep x 5'; seat 6 & arms 9 lvl 4 -attempted modified deadbugs with red theraball-terminated due to pain.  -Discussion regarding, progress, POC, and future PT plans with remaining visits.  -Functional transfers x 5 with BTB- cues for proper pelvic posture during transfer.  -Standing toe taps to 12 in box for core/pelvic stability -Practiced functional deadlifts with box and yellow weighted ball x 5 with cues for hip hinging  04/22/23:   -Nustep x 6; seat 6; arms 9 level 3  -Supine bridges with green theraband around knees 2 x 10 with 3' isometric -Supine lower trunk rotation x 10 each side  -Supine marching with GTB around knees 2 x 10 each LE  -Supine hip abduction with GTB around  knees 2 x 10  -standing rows with Black TB 2 x 10  -Standing shoulder extension 2 x 10 with green TB -Standing paloff press with Black TB x 10 each direction  MH applied to low back during supine exercises    04/18/2023: -Nustep x 5; seat 6; arms 9 level 3   -Supine bridges with green theraband around knees 2 x 10 with 3' isometric -Supine marching with GTB around knees 2 x 10 each LE  -Sidelying clamshells GTB 2 x 12 each side  -Supine TrA activation with use of red physioball 2 x 10 with 3 second hold -Supine TrA activation with alternating arm raise above head 2 x 10  - Seated paloff press with BTB x 10  -Seated row with BTB 2 x 10    PATIENT EDUCATION:  Education details: Findings and HEP below.  Person educated: Patient Education method: Medical illustrator Education comprehension: verbalized understanding  HOME EXERCISE PROGRAM: Access Code: [email or web address] URL: [email or web address] Date: 04/16/2023 Prepared by: Starling Manns  Exercises - Supine Posterior Pelvic Tilt  - 1 x daily - 7 x weekly - 3 sets - 10 reps - Supine Bridge  - 1 x daily - 7 x weekly - 3 sets - 10 reps - Bent Knee Fallouts  - 1 x daily - 7 x weekly - 3 sets - 10 reps - Clamshell with Resistance  - 1 x daily - 7 x weekly - 3 sets - 10 reps - Seated Anti-Rotation Press With Anchored Resistance  - 1 x daily - 7 x weekly - 3 sets - 10 reps  ASSESSMENT:  CLINICAL IMPRESSION:   Discussion with pt today regarding progress and continual pain rating and limited progress in reported pain. Pt reporting she is always in pain but notices some HEP being helpful. Will continue with current POC and discharge after reminder of visits with anticipation of reportedly getting steroid shots soon. Pt continues to show antalgic movement patterns due to LB pain, limiting work capacity and lifting capacity. Pt will continue to benefit from skilled PT services to address functional deficits and improve QOL.    OBJECTIVE IMPAIRMENTS: Abnormal gait, decreased mobility, difficulty walking, decreased ROM, decreased strength, increased fascial restrictions, and pain.   ACTIVITY LIMITATIONS: carrying, lifting, bending, sitting, standing, squatting, sleeping, and locomotion level  PARTICIPATION LIMITATIONS: community activity, occupation, and yard work  PERSONAL FACTORS: Age, Behavior pattern, Past/current experiences, and Social background are also affecting patient's functional outcome.   REHAB POTENTIAL: Good  CLINICAL DECISION MAKING: Evolving/moderate complexity  EVALUATION COMPLEXITY: Moderate   GOALS: Goals reviewed with patient? No  SHORT TERM GOALS: Target date: 04/14/2023  Pt will be independent with HEP in order to demonstrate participation in Physical Therapy  POC.  Baseline: Goal status: IN PROGRESS  2.  Pt will report 4/10 pain with mobility in order to demonstrate improved pain with functional activities.  Baseline:  Goal status: IN PROGRESS  LONG TERM GOALS: Target date: 05/12/2023  Pt will improve 30 second chair test by >3 reps in order to demonstrate improved functional strength to return to desired activities.  Baseline: 6x Goal status: IN PROGRESS  2.  Pt will improve 2 MWT by >121ft in order to demonstrate improved functional ambulatory capacity in community setting.  Baseline: see objective Goal status: IN PROGRESS  3.  Pt will improve FOTO score by >5 points in order to demonstrate improved pain with functional goals and outcomes. Baseline: 49.4 Goal status: IN PROGRESS  4.  Pt will report 3/10 pain with mobility in order to demonstrate reduced pain with overall functional status.  Baseline:  Goal status: IN PROGRESS   PLAN:  PT FREQUENCY: 2x/week  PT DURATION: 6 weeks  PLANNED INTERVENTIONS: 97164- PT Re-evaluation, 97110-Therapeutic exercises, 97530- Therapeutic activity, 97112- Neuromuscular re-education, 97535- Self Care, 16109- Manual therapy,  [phone]- Gait training, Balance training, and Joint mobilization.  PLAN FOR NEXT SESSION: Core and BLE strengthening, postural awareness.  Nelida Meuse PT, DPT Physical Therapist with [facility] 336 [phone] office  Nelida Meuse, PT 04/24/2023, 4:07 PM

## 2023-04-29 ENCOUNTER — Ambulatory Visit (HOSPITAL_COMMUNITY): Payer: 59

## 2023-04-29 DIAGNOSIS — M6281 Muscle weakness (generalized): Secondary | ICD-10-CM | POA: Diagnosis not present

## 2023-04-29 DIAGNOSIS — M25662 Stiffness of left knee, not elsewhere classified: Secondary | ICD-10-CM | POA: Diagnosis not present

## 2023-04-29 DIAGNOSIS — M5459 Other low back pain: Secondary | ICD-10-CM

## 2023-04-29 DIAGNOSIS — M25562 Pain in left knee: Secondary | ICD-10-CM | POA: Diagnosis not present

## 2023-04-29 NOTE — Therapy (Signed)
OUTPATIENT PHYSICAL THERAPY THORACOLUMBAR TREATMENT   Patient Name: Jacqueline [name] MRN: [id number] DOB:Mar 01, 1961, 62 y.o., female Today's Date: 04/29/2023  END OF SESSION:  PT End of Session - 04/29/23 1435     Visit Number 9    Number of Visits 12    Date for PT Re-Evaluation 05/11/24    Authorization Type [employer]; [employer] Secondary    Authorization Time Period 12 visits approved 10/21-12/20    Authorization - Visit Number 6    Authorization - Number of Visits 12    Progress Note Due on Visit 10    PT Start Time 0230    PT Stop Time 0310    PT Time Calculation (min) 40 min    Activity Tolerance Patient tolerated treatment well    Behavior During Therapy WFL for tasks assessed/performed                 Past Medical History:  Diagnosis Date   Anemia    Arthritis    per patient, back   Complication of anesthesia 2018   per patient HR dropped during partial hysterectomy surgery   Fibroid uterus    GERD (gastroesophageal reflux disease)    H/O total hysterectomy    Heart murmur    Hyperlipidemia    Borderline   Hypertension    Numbness and tingling in right hand    Pre-diabetes    borderline   Sleep apnea    Past Surgical History:  Procedure Laterality Date   BACK SURGERY     BALLOON DILATION N/A 05/22/2021   Procedure: BALLOON DILATION;  Surgeon: Lanelle Bal, DO;  [facility]: [facility];  Service: Endoscopy;  Laterality: N/A;   BIOPSY  11/18/2022   Procedure: BIOPSY;  Surgeon: Corbin Ade, MD;  Location: [facility];  Service: Endoscopy;;   CHOLECYSTECTOMY     COLONOSCOPY  2003   Dr. Jena Gauss: Anal canal hemorrhoids   COLONOSCOPY N/A 08/25/2014   Normal colonoscopy.  Next colonoscopy in 2026.  Procedure: COLONOSCOPY;  Surgeon: Corbin Ade, MD;  [facility]: [facility];  Service: Endoscopy;  Laterality: N/A;  1200   ESOPHAGOGASTRODUODENOSCOPY N/A 08/25/2014   Procedure: ESOPHAGOGASTRODUODENOSCOPY (EGD);  Surgeon: Corbin Ade, MD;  [facility]: [facility];  Service: Endoscopy;  Laterality: N/A;   ESOPHAGOGASTRODUODENOSCOPY (EGD) WITH PROPOFOL N/A 05/22/2021   Web and distal esophagus, dilated, gastriti, biopsy positive for mildly active H. pylori gastritis.  s procedure: ESOPHAGOGASTRODUODENOSCOPY (EGD) WITH PROPOFOL;  Surgeon: Lanelle Bal, DO;  [facility]: [facility];  Service: Endoscopy;  Laterality: N/A;  9:15am   ESOPHAGOGASTRODUODENOSCOPY (EGD) WITH PROPOFOL N/A 11/18/2022   Procedure: ESOPHAGOGASTRODUODENOSCOPY (EGD) WITH PROPOFOL;  Surgeon: Corbin Ade, MD;  [facility]: [facility];  Service: Endoscopy;  Laterality: N/A;  2:15 pm, asa 2   KNEE ARTHROSCOPY WITH MEDIAL MENISECTOMY Left 12/25/2022   Procedure: KNEE ARTHROSCOPY WITH PARTIAL LATERAL MENISCECTOMY DEBRIDEMENT ACL;  Surgeon: Vickki Hearing, MD;  Location: [facility];  Service: Orthopedics;  Laterality: Left;   MALONEY DILATION N/A 11/18/2022   Procedure: Elease Hashimoto DILATION;  Surgeon: Corbin Ade, MD;  [facility]: [facility];  Service: Endoscopy;  Laterality: N/A;   SALPINGOOPHORECTOMY Bilateral 11/06/2016   Procedure: BILATERAL SALPINGO OOPHORECTOMY;  Surgeon: Lazaro Arms, MD;  Location: [facility];  Service: Gynecology;  Laterality: Bilateral;   SUPRACERVICAL ABDOMINAL HYSTERECTOMY N/A 11/06/2016   Procedure: HYSTERECTOMY SUPRACERVICAL ABDOMINAL;  Surgeon: Lazaro Arms, MD;  Location: [facility];  Service:  Gynecology;  Laterality: N/A;   TRANSFORAMINAL LUMBAR INTERBODY FUSION (TLIF) WITH PEDICLE SCREW FIXATION 2 LEVEL Left 07/28/2019   Procedure: LEFT-SIDED LUMBAR 4- 5, LUMBAR 5 -SACRUM1 TRANSFORAMINAL LUMBAR INTERBODY FUSION WITH INSTRUMENTATION AND ALLOGRAFT;  Surgeon: Estill Bamberg, MD;  Location: [facility];  Service: Orthopedics;  Laterality: Left;   TUBAL LIGATION     Patient Active Problem List   Diagnosis Date Noted   S/P arthroscopy of left knee debridement of ACL/ lateral meniscectomy 12/25/22 01/02/2023   Bucket-handle tear of  lateral meniscus of left knee as current injury 12/25/2022   Left anterior cruciate ligament tear 12/25/2022   Acute non-recurrent maxillary sinusitis 10/26/2022   Mucopurulent conjunctivitis of both eyes 10/26/2022   Acute bronchitis 10/25/2022   Maxillary sinusitis, acute 10/25/2022   Conjunctivitis 10/25/2022   Neck pain, chronic 08/28/2022   Sleep apnea 08/28/2022   Need for immunization against influenza 02/28/2022   Screening for cervical cancer 12/21/2021   H. pylori infection 12/05/2021   Snoring 11/23/2021   Insomnia 11/23/2021   Annual physical exam 10/16/2021   Dermatitis 10/16/2021   Need for varicella vaccine 10/16/2021   Diarrhea 07/05/2021   Stomach cramps 07/05/2021   Nausea & vomiting 07/05/2021   Encounter for vaccination 07/05/2021   Right foot pain 05/16/2021   Dysphagia 05/01/2021   Right shoulder pain 12/27/2020   Rash 12/13/2020   Chronic back pain 04/04/2020   History of back surgery 04/04/2020   Radiculopathy 07/28/2019   Borderline diabetes 12/25/2018   Hypertension 06/30/2018   S/P abdominal supracervical subtotal hysterectomy 11/06/2016   Trigger middle finger of right hand 04/24/2016   GERD (gastroesophageal reflux disease) 08/04/2014   Shoulder strain 07/26/2014   Degenerative arthritis of thumb 07/26/2014   Left knee pain 02/28/2014   Hip pain 04/15/2013   Epicondylitis, lateral (tennis elbow) 04/13/2013   Constipation 09/15/2012   Hyperlipidemia 11/28/2011   Obesity, Class I, BMI 30.0-34.9 (see actual BMI) 09/15/2011   ALLERGIC RHINITIS, SEASONAL 11/13/2007    PCP: Gilmore Laroche, FNP  REFERRING PROVIDER: Estill Bamberg MD  REFERRING DIAG: M54.50 (ICD-10-CM) - Low back pain   Rationale for Evaluation and Treatment: Rehabilitation  THERAPY DIAG:  Other low back pain  Muscle weakness (generalized)  ONSET DATE: 2017  SUBJECTIVE:                                                                                                                                                                                            SUBJECTIVE STATEMENT:    Good days and bad days with her back pain; uses some Tiger Balm and Voltaren cream for pain relief/management; having a  back injection 12/3  PERTINENT HISTORY:  Meniscus repair and torn ligament on left knee 12/25/22.   PAIN:  Are you having pain? Yes: NPRS scale: 6 today/10 Pain location: Points to SIJ and lumbar psine Pain description: "it just hurts"-sharp pain Aggravating factors: "I don't know, I can be in the bed and it just happens" Relieving factors: Prescribed pain meds and tactile pressure  PRECAUTIONS: None  RED FLAGS: None   WEIGHT BEARING RESTRICTIONS: No  FALLS:  Has patient fallen in last 6 months? No  LIVING ENVIRONMENT: Lives with: lives with their spouse Lives in: House/apartment Stairs: Yes: External: 5 steps; can reach both Has following equipment at home: Single point cane and Walker - 2 wheeled  OCCUPATION: Work part time since May 23rd  PLOF: Independent  PATIENT GOALS: get out of pain  NEXT MD VISIT: tomorrow  OBJECTIVE:  Note: Objective measures were completed at Evaluation unless otherwise noted.  DIAGNOSTIC FINDINGS:  CLINICAL DATA:  Lumbar fusion at L4-5 and L5-S1   EXAM: LUMBAR SPINE - 2-3 VIEW; DG C-ARM 1-60 MIN   COMPARISON:  Prior intraoperative film from earlier in the same day.   FLUOROSCOPY TIME:  Fluoroscopy Time:  1 minutes 31 seconds   Radiation Exposure Index (if provided by the fluoroscopic device): Not available   Number of Acquired Spot Images: 3   FINDINGS: Pedicle screws are noted at L4, L5 and S1 with posterior fixation. Interbody fusion at both levels is noted.   IMPRESSION: L4-5 and L5-S1 fusion.  PATIENT SURVEYS:  FOTO 49.4   COGNITION: Overall cognitive status: Within functional limits for tasks assessed     SENSATION: WFL   POSTURE: rounded shoulders, forward head, and anterior pelvic  tilt  PALPATION: Reported TTP, no noticeable reactions when palpating.   LUMBAR ROM:   AROM eval  Flexion Touch knees  Extension 15%  Right lateral flexion 20%  Left lateral flexion 20%  Right rotation 20%  Left rotation 20%   (Blank rows = not tested)  LOWER EXTREMITY ROM:     Active  Right eval Left eval  Hip flexion    Hip extension    Hip abduction    Hip adduction    Hip internal rotation    Hip external rotation    Knee flexion    Knee extension    Ankle dorsiflexion    Ankle plantarflexion    Ankle inversion    Ankle eversion     (Blank rows = not tested)  LOWER EXTREMITY MMT:    MMT Right eval Left eval  Hip flexion 3+ 3+  Hip extension 3+ 3+  Hip abduction    Hip adduction    Hip internal rotation    Hip external rotation    Knee flexion    Knee extension 4 4  Ankle dorsiflexion    Ankle plantarflexion    Ankle inversion    Ankle eversion     (Blank rows = not tested)   FUNCTIONAL TESTS:  30 seconds chair stand test: 5x 2 minute walk test: 226ft  GAIT: Distance walked: 286ft Assistive device utilized: None Level of assistance: Complete Independence Comments: Antalgic gait with RLE, bilateral trendelenberg, limited hip flexion bilaterally during swing  TODAY'S TREATMENT:  DATE:  04/29/23  Nustep x 5'; seat 6 dynamic warm up Standing: heel raises x 25 Slant board 5 x 20" GTB scapular retractions 2 x 10 GTB shoulder extension 2 x 10 Paloff press GTB x 10 each  Seated: Hamstring stretch 3 x 20" each Physioball abdominal isometric 5" x 10 Physioball roll out for flexion x 10  Supine: LTR x 10 Single knee to chest 3 x 15" each   04/24/2023  -Nustep x 5'; seat 6 & arms 9 lvl 4 -attempted modified deadbugs with red theraball-terminated due to pain.  -Discussion regarding, progress, POC, and future PT plans  with remaining visits.  -Functional transfers x 5 with BTB- cues for proper pelvic posture during transfer.  -Standing toe taps to 12 in box for core/pelvic stability -Practiced functional deadlifts with box and yellow weighted ball x 5 with cues for hip hinging  04/22/23:   -Nustep x 6; seat 6; arms 9 level 3  -Supine bridges with green theraband around knees 2 x 10 with 3' isometric -Supine lower trunk rotation x 10 each side  -Supine marching with GTB around knees 2 x 10 each LE  -Supine hip abduction with GTB around knees 2 x 10  -standing rows with Black TB 2 x 10  -Standing shoulder extension 2 x 10 with green TB -Standing paloff press with Black TB x 10 each direction  MH applied to low back during supine exercises    04/18/2023: -Nustep x 5; seat 6; arms 9 level 3   -Supine bridges with green theraband around knees 2 x 10 with 3' isometric -Supine marching with GTB around knees 2 x 10 each LE  -Sidelying clamshells GTB 2 x 12 each side  -Supine TrA activation with use of red physioball 2 x 10 with 3 second hold -Supine TrA activation with alternating arm raise above head 2 x 10  - Seated paloff press with BTB x 10  -Seated row with BTB 2 x 10    PATIENT EDUCATION:  Education details: Findings and HEP below.  Person educated: Patient Education method: Medical illustrator Education comprehension: verbalized understanding  HOME EXERCISE PROGRAM: Access Code: [email or web address] URL: [email or web address] Date: 04/16/2023 Prepared by: Starling Manns  Exercises - Supine Posterior Pelvic Tilt  - 1 x daily - 7 x weekly - 3 sets - 10 reps - Supine Bridge  - 1 x daily - 7 x weekly - 3 sets - 10 reps - Bent Knee Fallouts  - 1 x daily - 7 x weekly - 3 sets - 10 reps - Clamshell with Resistance  - 1 x daily - 7 x weekly - 3 sets - 10 reps - Seated Anti-Rotation Press With Anchored Resistance  - 1 x daily - 7 x weekly - 3 sets - 10 reps  ASSESSMENT:  CLINICAL  IMPRESSION:   Today's session with focus on core strengthening and awareness of activating core and maintaining good posture with strengthening activities.  Reports of some low back pain as she starts to fatigue; needs cues for good posturing throughout treatment.  Ended with stretching on mat table; patient reports increased low back pain with LTR today and hip discomfort with single knee to chest.  Noted fatigue at the end of treatment today.  4/10 pain low back at the end of treatment.   Pt will continue to benefit from skilled PT services to address functional deficits and improve QOL.   OBJECTIVE IMPAIRMENTS: Abnormal gait, decreased mobility, difficulty walking, decreased ROM, decreased  strength, increased fascial restrictions, and pain.   ACTIVITY LIMITATIONS: carrying, lifting, bending, sitting, standing, squatting, sleeping, and locomotion level  PARTICIPATION LIMITATIONS: community activity, occupation, and yard work  PERSONAL FACTORS: Age, Behavior pattern, Past/current experiences, and Social background are also affecting patient's functional outcome.   REHAB POTENTIAL: Good  CLINICAL DECISION MAKING: Evolving/moderate complexity  EVALUATION COMPLEXITY: Moderate   GOALS: Goals reviewed with patient? No  SHORT TERM GOALS: Target date: 04/14/2023  Pt will be independent with HEP in order to demonstrate participation in Physical Therapy POC.  Baseline: Goal status: IN PROGRESS  2.  Pt will report 4/10 pain with mobility in order to demonstrate improved pain with functional activities.  Baseline:  Goal status: IN PROGRESS  LONG TERM GOALS: Target date: 05/12/2023  Pt will improve 30 second chair test by >3 reps in order to demonstrate improved functional strength to return to desired activities.  Baseline: 6x Goal status: IN PROGRESS  2.  Pt will improve 2 MWT by >190ft in order to demonstrate improved functional ambulatory capacity in community setting.  Baseline: see  objective Goal status: IN PROGRESS  3.  Pt will improve FOTO score by >5 points in order to demonstrate improved pain with functional goals and outcomes. Baseline: 49.4 Goal status: IN PROGRESS  4.  Pt will report 3/10 pain with mobility in order to demonstrate reduced pain with overall functional status.  Baseline:  Goal status: IN PROGRESS   PLAN:  PT FREQUENCY: 2x/week  PT DURATION: 6 weeks  PLANNED INTERVENTIONS: 97164- PT Re-evaluation, 97110-Therapeutic exercises, 97530- Therapeutic activity, 97112- Neuromuscular re-education, 97535- Self Care, [id number]- Manual therapy, [phone]- Gait training, Balance training, and Joint mobilization.  PLAN FOR NEXT SESSION: Core and BLE strengthening, postural awareness.  3:10 PM, 04/29/23 Myelle Poteat Small Eulene Pekar MPT [facility] [place] [phone]

## 2023-04-30 ENCOUNTER — Ambulatory Visit (HOSPITAL_COMMUNITY): Payer: 59

## 2023-04-30 ENCOUNTER — Encounter (HOSPITAL_COMMUNITY): Payer: Self-pay

## 2023-04-30 DIAGNOSIS — M25562 Pain in left knee: Secondary | ICD-10-CM

## 2023-04-30 DIAGNOSIS — M6281 Muscle weakness (generalized): Secondary | ICD-10-CM

## 2023-04-30 DIAGNOSIS — M25662 Stiffness of left knee, not elsewhere classified: Secondary | ICD-10-CM

## 2023-04-30 DIAGNOSIS — M5459 Other low back pain: Secondary | ICD-10-CM | POA: Diagnosis not present

## 2023-04-30 NOTE — Therapy (Signed)
OUTPATIENT PHYSICAL THERAPY THORACOLUMBAR DISCHARGE Physical Therapy Progress Note   Dates of reporting period  03/31/23   to   04/30/23  Patient Name: Jacqueline [name] MRN: [id number] DOB:02-04-1961, 62 y.o., female Today's Date: 04/30/2023  END OF SESSION:  PT End of Session - 04/30/23 1431     Visit Number 10    Number of Visits 12    Date for PT Re-Evaluation 05/11/24    Authorization Type [employer]; [employer] Secondary    Authorization Time Period 12 visits approved 10/21-12/20    Authorization - Number of Visits 12    Progress Note Due on Visit 10    PT Start Time 1431    PT Stop Time 1459    PT Time Calculation (min) 28 min    Activity Tolerance Patient tolerated treatment well    Behavior During Therapy WFL for tasks assessed/performed                  Past Medical History:  Diagnosis Date   Anemia    Arthritis    per patient, back   Complication of anesthesia 2018   per patient HR dropped during partial hysterectomy surgery   Fibroid uterus    GERD (gastroesophageal reflux disease)    H/O total hysterectomy    Heart murmur    Hyperlipidemia    Borderline   Hypertension    Numbness and tingling in right hand    Pre-diabetes    borderline   Sleep apnea    Past Surgical History:  Procedure Laterality Date   BACK SURGERY     BALLOON DILATION N/A 05/22/2021   Procedure: BALLOON DILATION;  Surgeon: Lanelle Bal, DO;  [facility];  Service: Endoscopy;  Laterality: N/A;   BIOPSY  11/18/2022   Procedure: BIOPSY;  Surgeon: Corbin Ade, MD;  [facility];  Service: Endoscopy;;   CHOLECYSTECTOMY     COLONOSCOPY  2003   Dr. Jena Gauss: Anal canal hemorrhoids   COLONOSCOPY N/A 08/25/2014   Normal colonoscopy.  Next colonoscopy in 2026.  Procedure: COLONOSCOPY;  Surgeon: Corbin Ade, MD;  [facility];  Service: Endoscopy;  Laterality: N/A;  1200   ESOPHAGOGASTRODUODENOSCOPY N/A 08/25/2014    Procedure: ESOPHAGOGASTRODUODENOSCOPY (EGD);  Surgeon: Corbin Ade, MD;  [facility];  Service: Endoscopy;  Laterality: N/A;   ESOPHAGOGASTRODUODENOSCOPY (EGD) WITH PROPOFOL N/A 05/22/2021   Web and distal esophagus, dilated, gastriti, biopsy positive for mildly active H. pylori gastritis.  s procedure: ESOPHAGOGASTRODUODENOSCOPY (EGD) WITH PROPOFOL;  Surgeon: Lanelle Bal, DO;  [facility];  Service: Endoscopy;  Laterality: N/A;  9:15am   ESOPHAGOGASTRODUODENOSCOPY (EGD) WITH PROPOFOL N/A 11/18/2022   Procedure: ESOPHAGOGASTRODUODENOSCOPY (EGD) WITH PROPOFOL;  Surgeon: Corbin Ade, MD;  [facility];  Service: Endoscopy;  Laterality: N/A;  2:15 pm, asa 2   KNEE ARTHROSCOPY WITH MEDIAL MENISECTOMY Left 12/25/2022   Procedure: KNEE ARTHROSCOPY WITH PARTIAL LATERAL MENISCECTOMY DEBRIDEMENT ACL;  Surgeon: Vickki Hearing, MD;  Location: [facility];  Service: Orthopedics;  Laterality: Left;   MALONEY DILATION N/A 11/18/2022   Procedure: Elease Hashimoto DILATION;  Surgeon: Corbin Ade, MD;  [facility];  Service: Endoscopy;  Laterality: N/A;   SALPINGOOPHORECTOMY Bilateral 11/06/2016   Procedure: BILATERAL SALPINGO OOPHORECTOMY;  Surgeon: Lazaro Arms, MD;  Location: [facility];  Service: Gynecology;  Laterality: Bilateral;   SUPRACERVICAL ABDOMINAL HYSTERECTOMY N/A 11/06/2016   Procedure: HYSTERECTOMY SUPRACERVICAL ABDOMINAL;  Surgeon:  Lazaro Arms, MD;  Location: [facility];  Service: Gynecology;  Laterality: N/A;   TRANSFORAMINAL LUMBAR INTERBODY FUSION (TLIF) WITH PEDICLE SCREW FIXATION 2 LEVEL Left 07/28/2019   Procedure: LEFT-SIDED LUMBAR 4- 5, LUMBAR 5 -SACRUM1 TRANSFORAMINAL LUMBAR INTERBODY FUSION WITH INSTRUMENTATION AND ALLOGRAFT;  Surgeon: Estill Bamberg, MD;  Location: [facility];  Service: Orthopedics;  Laterality: Left;   TUBAL LIGATION     Patient Active Problem List   Diagnosis Date Noted   S/P arthroscopy of left knee debridement of ACL/  lateral meniscectomy 12/25/22 01/02/2023   Bucket-handle tear of lateral meniscus of left knee as current injury 12/25/2022   Left anterior cruciate ligament tear 12/25/2022   Acute non-recurrent maxillary sinusitis 10/26/2022   Mucopurulent conjunctivitis of both eyes 10/26/2022   Acute bronchitis 10/25/2022   Maxillary sinusitis, acute 10/25/2022   Conjunctivitis 10/25/2022   Neck pain, chronic 08/28/2022   Sleep apnea 08/28/2022   Need for immunization against influenza 02/28/2022   Screening for cervical cancer 12/21/2021   H. pylori infection 12/05/2021   Snoring 11/23/2021   Insomnia 11/23/2021   Annual physical exam 10/16/2021   Dermatitis 10/16/2021   Need for varicella vaccine 10/16/2021   Diarrhea 07/05/2021   Stomach cramps 07/05/2021   Nausea & vomiting 07/05/2021   Encounter for vaccination 07/05/2021   Right foot pain 05/16/2021   Dysphagia 05/01/2021   Right shoulder pain 12/27/2020   Rash 12/13/2020   Chronic back pain 04/04/2020   History of back surgery 04/04/2020   Radiculopathy 07/28/2019   Borderline diabetes 12/25/2018   Hypertension 06/30/2018   S/P abdominal supracervical subtotal hysterectomy 11/06/2016   Trigger middle finger of right hand 04/24/2016   GERD (gastroesophageal reflux disease) 08/04/2014   Shoulder strain 07/26/2014   Degenerative arthritis of thumb 07/26/2014   Left knee pain 02/28/2014   Hip pain 04/15/2013   Epicondylitis, lateral (tennis elbow) 04/13/2013   Constipation 09/15/2012   Hyperlipidemia 11/28/2011   Obesity, Class I, BMI 30.0-34.9 (see actual BMI) 09/15/2011   ALLERGIC RHINITIS, SEASONAL 11/13/2007    PCP: Gilmore Laroche, FNP  REFERRING PROVIDER: Estill Bamberg MD  REFERRING DIAG: M54.50 (ICD-10-CM) - Low back pain   Rationale for Evaluation and Treatment: Rehabilitation  THERAPY DIAG:  Other low back pain  Muscle weakness (generalized)  Acute pain of left knee  Stiffness of left knee, not elsewhere  classified  ONSET DATE: 2017  SUBJECTIVE:                                                                                                                                                                                           SUBJECTIVE STATEMENT:  Patient reports planning to meet with her doctor about injection on 12/3.   PERTINENT HISTORY:  Meniscus repair and torn ligament on left knee 12/25/22.   PAIN:  Are you having pain? Yes: NPRS scale: 6 today/10 Pain location: Points to SIJ and lumbar psine Pain description: "it just hurts"-sharp pain Aggravating factors: "I don't know, I can be in the bed and it just happens" Relieving factors: Prescribed pain meds and tactile pressure  PRECAUTIONS: None  RED FLAGS: None   WEIGHT BEARING RESTRICTIONS: No  FALLS:  Has patient fallen in last 6 months? No  LIVING ENVIRONMENT: Lives with: lives with their spouse Lives in: House/apartment Stairs: Yes: External: 5 steps; can reach both Has following equipment at home: Single point cane and Hassani Sliney - 2 wheeled  OCCUPATION: Work part time since May 23rd  PLOF: Independent  PATIENT GOALS: get out of pain  NEXT MD VISIT: tomorrow  OBJECTIVE:  Note: Objective measures were completed at Evaluation unless otherwise noted.  DIAGNOSTIC FINDINGS:  CLINICAL DATA:  Lumbar fusion at L4-5 and L5-S1   EXAM: LUMBAR SPINE - 2-3 VIEW; DG C-ARM 1-60 MIN   COMPARISON:  Prior intraoperative film from earlier in the same day.   FLUOROSCOPY TIME:  Fluoroscopy Time:  1 minutes 31 seconds   Radiation Exposure Index (if provided by the fluoroscopic device): Not available   Number of Acquired Spot Images: 3   FINDINGS: Pedicle screws are noted at L4, L5 and S1 with posterior fixation. Interbody fusion at both levels is noted.   IMPRESSION: L4-5 and L5-S1 fusion.  PATIENT SURVEYS:  FOTO 49.4   COGNITION: Overall cognitive status: Within functional limits for tasks  assessed     SENSATION: WFL   POSTURE: rounded shoulders, forward head, and anterior pelvic tilt  PALPATION: Reported TTP, no noticeable reactions when palpating.   LUMBAR ROM:   AROM eval  Flexion Touch knees  Extension 15%  Right lateral flexion 20%  Left lateral flexion 20%  Right rotation 20%  Left rotation 20%   (Blank rows = not tested)  LOWER EXTREMITY ROM:     Active  Right eval Left eval  Hip flexion    Hip extension    Hip abduction    Hip adduction    Hip internal rotation    Hip external rotation    Knee flexion    Knee extension    Ankle dorsiflexion    Ankle plantarflexion    Ankle inversion    Ankle eversion     (Blank rows = not tested)  LOWER EXTREMITY MMT:    MMT Right eval Left eval  Hip flexion 3+ 3+  Hip extension 3+ 3+  Hip abduction    Hip adduction    Hip internal rotation    Hip external rotation    Knee flexion    Knee extension 4 4  Ankle dorsiflexion    Ankle plantarflexion    Ankle inversion    Ankle eversion     (Blank rows = not tested)   FUNCTIONAL TESTS:  30 seconds chair stand test: 5x 2 minute walk test: 266ft  GAIT: Distance walked: 238ft Assistive device utilized: None Level of assistance: Complete Independence Comments: Antalgic gait with RLE, bilateral trendelenberg, limited hip flexion bilaterally during swing  TODAY'S TREATMENT:  DATE:  04/30/23:   Physical therapy treatment session today consisted of completing assessment of goals and administration of testing as demonstrated and documented in flow sheet, treatment, and goals section of this note.     04/29/23  Nustep x 5'; seat 6 dynamic warm up Standing: heel raises x 25 Slant board 5 x 20" GTB scapular retractions 2 x 10 GTB shoulder extension 2 x 10 Paloff press GTB x 10 each  Seated: Hamstring stretch 3 x 20"  each Physioball abdominal isometric 5" x 10 Physioball roll out for flexion x 10  Supine: LTR x 10 Single knee to chest 3 x 15" each   04/24/2023  -Nustep x 5'; seat 6 & arms 9 lvl 4 -attempted modified deadbugs with red theraball-terminated due to pain.  -Discussion regarding, progress, POC, and future PT plans with remaining visits.  -Functional transfers x 5 with BTB- cues for proper pelvic posture during transfer.  -Standing toe taps to 12 in box for core/pelvic stability -Practiced functional deadlifts with box and yellow weighted ball x 5 with cues for hip hinging   PATIENT EDUCATION:  Education details: Findings and HEP below.  Person educated: Patient Education method: Medical illustrator Education comprehension: verbalized understanding  HOME EXERCISE PROGRAM: Access Code: [email or web address] URL: [email or web address] Date: 04/16/2023 Prepared by: Starling Manns  Exercises - Supine Posterior Pelvic Tilt  - 1 x daily - 7 x weekly - 3 sets - 10 reps - Supine Bridge  - 1 x daily - 7 x weekly - 3 sets - 10 reps - Bent Knee Fallouts  - 1 x daily - 7 x weekly - 3 sets - 10 reps - Clamshell with Resistance  - 1 x daily - 7 x weekly - 3 sets - 10 reps - Seated Anti-Rotation Press With Anchored Resistance  - 1 x daily - 7 x weekly - 3 sets - 10 reps  ASSESSMENT:  CLINICAL IMPRESSION:    Patient has made progress towards physical therapy goals. Has met 3/5 goals with 2/5 being adequate for discharge as patient is limited with and 30 second chair stand due to L knee pain and discomfort. 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 Patient agreeable to discharge from PT this date.   OBJECTIVE IMPAIRMENTS: Abnormal gait, decreased mobility, difficulty walking, decreased ROM, decreased strength, increased fascial restrictions, and pain.   ACTIVITY LIMITATIONS:  carrying, lifting, bending, sitting, standing, squatting, sleeping, and locomotion level  PARTICIPATION LIMITATIONS: community activity, occupation, and yard work  PERSONAL FACTORS: Age, Behavior pattern, Past/current experiences, and Social background are also affecting patient's functional outcome.   REHAB POTENTIAL: Good  CLINICAL DECISION MAKING: Evolving/moderate complexity  EVALUATION COMPLEXITY: Moderate   GOALS: Goals reviewed with patient? No  SHORT TERM GOALS: Target date: 04/14/2023  Pt will be independent with HEP in order to demonstrate participation in Physical Therapy POC.  Baseline: 11/20: independent Goal status: MET  2.  Pt will report 4/10 pain with mobility in order to demonstrate improved pain with functional activities.  Baseline: 11/20: on average 4/10 - intermittent pain  Goal status: MET  LONG TERM GOALS: Target date: 05/12/2023  Pt will improve 30 second chair test by >3 reps in order to demonstrate improved functional strength to return to desired activities.  Baseline: 6x; 11/20: 7 Goal status: NOT MET - adequate for discharge  2.  Pt will improve 2 MWT by >155ft in order to demonstrate  improved functional ambulatory capacity in community setting.  Baseline: see objective; 11/20: 350'  Goal status: NOT MET - adequate for discharge   3.  Pt will improve FOTO score by >5 points in order to demonstrate improved pain with functional goals and outcomes. Baseline: 39; 11/20: 53 Goal status: MET  PLAN:  PT FREQUENCY: 2x/week  PT DURATION: 6 weeks  PLANNED INTERVENTIONS: [id number]- PT Re-evaluation, 97110-Therapeutic exercises, 97530- Therapeutic activity, 97112- Neuromuscular re-education, 97535- Self Care, 11914- Manual therapy, [phone]- Gait training, Balance training, and Joint mobilization.   3:05 PM, 04/30/23 Maylon Peppers, PT, DPT

## 2023-05-01 ENCOUNTER — Encounter (HOSPITAL_COMMUNITY): Payer: 59

## 2023-05-05 ENCOUNTER — Ambulatory Visit: Payer: 59 | Admitting: Orthopedic Surgery

## 2023-05-05 ENCOUNTER — Encounter (HOSPITAL_COMMUNITY): Payer: 59 | Admitting: Physical Therapy

## 2023-05-07 ENCOUNTER — Encounter (HOSPITAL_COMMUNITY): Payer: 59

## 2023-05-11 DIAGNOSIS — Z419 Encounter for procedure for purposes other than remedying health state, unspecified: Secondary | ICD-10-CM | POA: Diagnosis not present

## 2023-05-12 ENCOUNTER — Other Ambulatory Visit: Payer: Self-pay | Admitting: Family Medicine

## 2023-05-12 ENCOUNTER — Other Ambulatory Visit: Payer: Self-pay | Admitting: Obstetrics & Gynecology

## 2023-05-12 DIAGNOSIS — G4700 Insomnia, unspecified: Secondary | ICD-10-CM

## 2023-05-13 DIAGNOSIS — M47816 Spondylosis without myelopathy or radiculopathy, lumbar region: Secondary | ICD-10-CM | POA: Diagnosis not present

## 2023-05-15 ENCOUNTER — Ambulatory Visit (INDEPENDENT_AMBULATORY_CARE_PROVIDER_SITE_OTHER): Payer: 59 | Admitting: Orthopedic Surgery

## 2023-05-15 DIAGNOSIS — M23301 Other meniscus derangements, unspecified lateral meniscus, left knee: Secondary | ICD-10-CM | POA: Diagnosis not present

## 2023-05-15 DIAGNOSIS — Z9889 Other specified postprocedural states: Secondary | ICD-10-CM

## 2023-05-15 DIAGNOSIS — M238X2 Other internal derangements of left knee: Secondary | ICD-10-CM | POA: Diagnosis not present

## 2023-05-15 DIAGNOSIS — M1712 Unilateral primary osteoarthritis, left knee: Secondary | ICD-10-CM | POA: Diagnosis not present

## 2023-05-15 DIAGNOSIS — S83232A Complex tear of medial meniscus, current injury, left knee, initial encounter: Secondary | ICD-10-CM | POA: Diagnosis not present

## 2023-05-15 NOTE — Patient Instructions (Signed)
Get knee sleeve and then you can stop the economy hinge brace   Take tylenol and ibuprofen for pain   18 thigh 14.5 calf

## 2023-05-15 NOTE — Progress Notes (Signed)
  Office Visit Note   Patient: Jacqueline [name]           Date of Birth: April 30, 1961           MRN: [id number] Visit Date: 05/15/2023 Requested by: Gilmore Laroche, FNP [address] [place],  [place] [address] PCP: Gilmore Laroche, FNP   Chief Complaint  Patient presents with   Follow-up    Recheck on left knee, DOS 12/25/22   No Known Allergies Encounter Diagnoses  Name Primary?   S/P arthroscopy of left knee debridement of ACL/ lateral meniscectomy 12/25/22 Yes   Laxity of left anterior cruciate ligament    Lateral meniscus derangement, left    Unilateral primary osteoarthritis, left knee    Complex tear of medial meniscus of left knee as current injury, initial encounter    There is no height or weight on file to calculate BMI.  Jacqueline [name] is doing well she is having trouble with her economy hinged knee brace as it continues to slide down  She has occasional medial pain and some occasional anterior pain patellar tendon  She regained full range of motion She has slight laxity in the anterior drawer test   IMAGING: No results found.  MANAGEMENT   At this point I think she can go to a knee sleeve  Follow-up in 3 months  No orders of the defined types were placed in this encounter.   PROCEDURES: no   Jacqueline Canada, MD  05/15/2023 3:13 PM

## 2023-05-22 DIAGNOSIS — H40013 Open angle with borderline findings, low risk, bilateral: Secondary | ICD-10-CM | POA: Diagnosis not present

## 2023-05-22 DIAGNOSIS — H25813 Combined forms of age-related cataract, bilateral: Secondary | ICD-10-CM | POA: Diagnosis not present

## 2023-05-22 DIAGNOSIS — H18413 Arcus senilis, bilateral: Secondary | ICD-10-CM | POA: Diagnosis not present

## 2023-06-02 ENCOUNTER — Other Ambulatory Visit: Payer: Self-pay | Admitting: Obstetrics & Gynecology

## 2023-06-09 ENCOUNTER — Other Ambulatory Visit: Payer: Self-pay | Admitting: Family Medicine

## 2023-06-09 DIAGNOSIS — G4700 Insomnia, unspecified: Secondary | ICD-10-CM

## 2023-06-11 DIAGNOSIS — Z419 Encounter for procedure for purposes other than remedying health state, unspecified: Secondary | ICD-10-CM | POA: Diagnosis not present

## 2023-06-19 DIAGNOSIS — H25812 Combined forms of age-related cataract, left eye: Secondary | ICD-10-CM | POA: Diagnosis not present

## 2023-06-20 ENCOUNTER — Other Ambulatory Visit: Payer: Self-pay | Admitting: Family Medicine

## 2023-06-20 DIAGNOSIS — G8929 Other chronic pain: Secondary | ICD-10-CM

## 2023-06-23 NOTE — H&P (Signed)
 Surgical History & Physical  Patient Name: Jacqueline [name]  DOB: 04-19-1961  Surgery: Cataract extraction with intraocular lens implant phacoemulsification; Left Eye Surgeon: Lynwood Hermann MD Surgery Date: 06/27/2023 Pre-Op Date: 05/21/2024  HPI: 63 year old female patient who was referred by Dr. Gladis for a cataract eval in both eyes. . The patient complains of difficulty when reading fine print, books, newspaper, instructions etc., which began 1 year ago. Both eyes are affected. The episode is constant. This is negatively affecting the patient's quality of life and the patient is unable to function adequately in life with the current level of vision. The condition's severity is worsening. Distance vision is worse than near. The patient experiences no flashes, floater, shadow, curtain or veil. HPI Completed by Dr. Lynwood Hermann  Medical History: Cataracts  Arthritis High Blood Pressure  Review of Systems Negative Allergic/Immunologic Hypertension Cardiovascular Negative Constitutional Negative Endocrine Cataract Eyes Negative Gastrointestinal Negative Genitourinary Negative Hemotologic/Lymphatic Negative Integumentary Arthritis Musculoskeletal Negative Neurological Negative Psychiatry Negative Respiratory  Social Never smoked   Medication methocarbamol  ,  amlodipine  ,  trazodone  ,  estradiol  ,  prednisone  ,  ibuprofen  ,  pantoprazole  ,  diclofenac  sodium ,  diazepam  ,  duloxetine  ,  ezetimibe    Sx/Procedures Knee, Back Surgery  Drug Allergies  NKDA  History & Physical: Heent: cataracts NECK: supple without bruits LUNGS: lungs clear to auscultation CV: regular rate and rhythm Abdomen: soft and non-tender  Impression & Plan: Assessment: 1.  COMBINED FORMS AGE RELATED CATARACT; Both Eyes (H25.813) 2.  OAG BORDERLINE FINDINGS LOW RISK; Both Eyes (H40.013) 3.  ARCUS SENILIS; Both Eyes (H18.413)  Plan: 1.  Cataract accounts for the patient's decreased vision. This  visual impairment is not correctable with a tolerable change in glasses or contact lenses. Cataract surgery with an implantation of a new lens should significantly improve the visual and functional status of the patient. Discussed all risks, benefits, alternatives, and potential complications. Discussed the procedures and recovery. Patient desires to have surgery. A-scan ordered and performed today for intra-ocular lens calculations. The surgery will be performed in order to improve vision for driving, reading, and for eye examinations. Recommend phacoemulsification with intra-ocular lens. Recommend Dextenza  for post-operative pain and inflammation. Left Eye worse - first. Dilates poorly - shugarcaine by protocol. Malyugin Ring. Omidira.  2.  Based on cup-to-disc ratio. Negative Family history. OCT rNFL shows: WNL OU. IOP WNL OU. Detailed discussion about glaucoma today including importance of maintaining good follow up and following treatment plan, and the possibility of irreversible blindness as part of this disease process.  3.  Discussed significance of finding

## 2023-06-24 ENCOUNTER — Encounter (HOSPITAL_COMMUNITY)
Admission: RE | Admit: 2023-06-24 | Discharge: 2023-06-24 | Disposition: A | Discharge status: Home / Self Care | Payer: Medicaid Other | Source: Ambulatory Visit | Attending: Ophthalmology | Admitting: Ophthalmology

## 2023-06-27 ENCOUNTER — Ambulatory Visit (HOSPITAL_BASED_OUTPATIENT_CLINIC_OR_DEPARTMENT_OTHER): Payer: Medicaid Other | Admitting: Certified Registered"

## 2023-06-27 ENCOUNTER — Encounter (HOSPITAL_COMMUNITY)
Admission: RE | Disposition: A | Discharge status: Home / Self Care | Payer: Self-pay | Source: Home / Self Care | Attending: Ophthalmology

## 2023-06-27 ENCOUNTER — Ambulatory Visit (HOSPITAL_COMMUNITY): Payer: Medicaid Other | Admitting: Certified Registered"

## 2023-06-27 ENCOUNTER — Ambulatory Visit (HOSPITAL_COMMUNITY)
Admission: RE | Admit: 2023-06-27 | Discharge: 2023-06-27 | Disposition: A | Discharge status: Home / Self Care | Payer: Medicaid Other | Attending: Ophthalmology | Admitting: Ophthalmology

## 2023-06-27 DIAGNOSIS — G709 Myoneural disorder, unspecified: Secondary | ICD-10-CM | POA: Diagnosis not present

## 2023-06-27 DIAGNOSIS — G4730 Sleep apnea, unspecified: Secondary | ICD-10-CM | POA: Insufficient documentation

## 2023-06-27 DIAGNOSIS — G4733 Obstructive sleep apnea (adult) (pediatric): Secondary | ICD-10-CM | POA: Diagnosis not present

## 2023-06-27 DIAGNOSIS — I10 Essential (primary) hypertension: Secondary | ICD-10-CM | POA: Insufficient documentation

## 2023-06-27 DIAGNOSIS — H18413 Arcus senilis, bilateral: Secondary | ICD-10-CM | POA: Insufficient documentation

## 2023-06-27 DIAGNOSIS — K219 Gastro-esophageal reflux disease without esophagitis: Secondary | ICD-10-CM | POA: Diagnosis not present

## 2023-06-27 DIAGNOSIS — H40013 Open angle with borderline findings, low risk, bilateral: Secondary | ICD-10-CM | POA: Insufficient documentation

## 2023-06-27 DIAGNOSIS — M1990 Unspecified osteoarthritis, unspecified site: Secondary | ICD-10-CM | POA: Insufficient documentation

## 2023-06-27 DIAGNOSIS — H25812 Combined forms of age-related cataract, left eye: Secondary | ICD-10-CM

## 2023-06-27 HISTORY — PX: CATARACT EXTRACTION W/PHACO: SHX586

## 2023-06-27 LAB — GLUCOSE, CAPILLARY: Glucose-Capillary: 76 mg/dL (ref 70–99)

## 2023-06-27 SURGERY — PHACOEMULSIFICATION, CATARACT, WITH IOL INSERTION
Anesthesia: Monitor Anesthesia Care | Site: Eye | Laterality: Left

## 2023-06-27 MED ORDER — MIDAZOLAM HCL 2 MG/2ML IJ SOLN
INTRAMUSCULAR | Status: DC | PRN
  Administered 2023-06-27: 1 mg via INTRAVENOUS

## 2023-06-27 MED ORDER — SODIUM HYALURONATE 10 MG/ML IO SOLUTION
PREFILLED_SYRINGE | INTRAOCULAR | Status: DC | PRN
  Administered 2023-06-27: .85 mL via INTRAOCULAR

## 2023-06-27 MED ORDER — LIDOCAINE HCL (PF) 1 % IJ SOLN
INTRAOCULAR | Status: DC | PRN
  Administered 2023-06-27: 1 mL via OPHTHALMIC

## 2023-06-27 MED ORDER — TETRACAINE HCL 0.5 % OP SOLN
1.0000 [drp] | OPHTHALMIC | Status: AC | PRN
Start: 2023-06-27 — End: 2023-06-27
  Administered 2023-06-27 (×3): 1 [drp] via OPHTHALMIC

## 2023-06-27 MED ORDER — MOXIFLOXACIN HCL 5 MG/ML IO SOLN
INTRAOCULAR | Status: DC | PRN
  Administered 2023-06-27: .2 mL via INTRACAMERAL

## 2023-06-27 MED ORDER — TROPICAMIDE 1 % OP SOLN
1.0000 [drp] | OPHTHALMIC | Status: AC | PRN
Start: 2023-06-27 — End: 2023-06-27
  Administered 2023-06-27 (×3): 1 [drp] via OPHTHALMIC

## 2023-06-27 MED ORDER — BSS IO SOLN
INTRAOCULAR | Status: DC | PRN
  Administered 2023-06-27: 15 mL via INTRAOCULAR

## 2023-06-27 MED ORDER — STERILE WATER FOR IRRIGATION IR SOLN
Status: DC | PRN
  Administered 2023-06-27: 250 mL

## 2023-06-27 MED ORDER — SODIUM CHLORIDE 0.9% FLUSH
3.0000 mL | Freq: Two times a day (BID) | INTRAVENOUS | Status: DC
  Administered 2023-06-27: 6 mL via INTRAVENOUS

## 2023-06-27 MED ORDER — SODIUM CHLORIDE 0.9% FLUSH
3.0000 mL | INTRAVENOUS | Status: DC | PRN
  Administered 2023-06-27: 10 mL via INTRAVENOUS

## 2023-06-27 MED ORDER — POVIDONE-IODINE 5 % OP SOLN
OPHTHALMIC | Status: DC | PRN
  Administered 2023-06-27: 1 via OPHTHALMIC

## 2023-06-27 MED ORDER — PHENYLEPHRINE-KETOROLAC 1-0.3 % IO SOLN
INTRAOCULAR | Status: DC | PRN
  Administered 2023-06-27: 500 mL via OPHTHALMIC

## 2023-06-27 MED ORDER — MIDAZOLAM HCL 2 MG/2ML IJ SOLN
INTRAMUSCULAR | Status: AC
  Filled 2023-06-27: qty 2

## 2023-06-27 MED ORDER — SODIUM HYALURONATE 23MG/ML IO SOSY
PREFILLED_SYRINGE | INTRAOCULAR | Status: DC | PRN
  Administered 2023-06-27: .6 mL via INTRAOCULAR

## 2023-06-27 MED ORDER — PHENYLEPHRINE HCL 2.5 % OP SOLN
1.0000 [drp] | OPHTHALMIC | Status: AC | PRN
  Administered 2023-06-27 (×3): 1 [drp] via OPHTHALMIC

## 2023-06-27 MED ORDER — LIDOCAINE HCL 3.5 % OP GEL
1.0000 | Freq: Once | OPHTHALMIC | Status: AC
  Administered 2023-06-27: 1 via OPHTHALMIC

## 2023-06-27 SURGICAL SUPPLY — 12 items
CATARACT SUITE SIGHTPATH (MISCELLANEOUS) ×1
CLOTH BEACON ORANGE TIMEOUT ST (SAFETY) ×1 IMPLANT
EYE SHIELD UNIVERSAL CLEAR (GAUZE/BANDAGES/DRESSINGS) IMPLANT
FEE CATARACT SUITE SIGHTPATH (MISCELLANEOUS) ×1 IMPLANT
GLOVE BIOGEL PI IND STRL 7.0 (GLOVE) ×2 IMPLANT
LENS IOL TECNIS EYHANCE 17.5 (Intraocular Lens) IMPLANT
NDL HYPO 18GX1.5 BLUNT FILL (NEEDLE) ×1 IMPLANT
NEEDLE HYPO 18GX1.5 BLUNT FILL (NEEDLE) ×1
PAD ARMBOARD 7.5X6 YLW CONV (MISCELLANEOUS) ×1 IMPLANT
SYR TB 1ML LL NO SAFETY (SYRINGE) ×1 IMPLANT
TAPE SURG TRANSPORE 1 IN (GAUZE/BANDAGES/DRESSINGS) IMPLANT
WATER STERILE IRR 250ML POUR (IV SOLUTION) ×1 IMPLANT

## 2023-06-27 NOTE — Anesthesia Preprocedure Evaluation (Addendum)
Anesthesia Evaluation  Patient identified by MRN, date of birth, ID band Patient awake    Reviewed: Allergy & Precautions, H&P , NPO status , Patient's Chart, lab work & pertinent test results  History of Anesthesia Complications (+) history of anesthetic complications (hypotension)  Airway Mallampati: II  TM Distance: >3 FB Neck ROM: Full    Dental  (+) Missing, Dental Advisory Given Teeth mostly intact:   Pulmonary sleep apnea    Pulmonary exam normal breath sounds clear to auscultation       Cardiovascular Exercise Tolerance: Good hypertension, Pt. on medications Normal cardiovascular exam+ Valvular Problems/Murmurs  Rhythm:Regular Rate:Normal  16-Aug-2022 17:05:03 [facility] ROUTINE RECORD 15-Jun-1960 (61 yr) Female Black Vent. 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) Abnormal ECG When compared with ECG of 16-Aug-2022 15:22, QRS axis Shifted right Inverted T waves have replaced nonspecific T wave abnormality in Inferior leads Confirmed by Meridee Score [phone]) on 08/16/2022 5:26:09 PM   Neuro/Psych  Neuromuscular disease  negative psych ROS   GI/Hepatic Neg liver ROS,GERD  Medicated and Controlled,,  Endo/Other  negative endocrine ROS    Renal/GU negative Renal ROS  negative genitourinary   Musculoskeletal  (+) Arthritis , Osteoarthritis,    Abdominal Normal abdominal exam  (+)   Peds negative pediatric ROS (+)  Hematology  (+) Blood dyscrasia, anemia   Anesthesia Other Findings   Reproductive/Obstetrics negative OB ROS                             Anesthesia Physical Anesthesia Plan  ASA: 3  Anesthesia Plan: MAC   Post-op Pain Management:    Induction:   PONV Risk Score and Plan:   Airway Management Planned: Nasal  Cannula and Natural Airway  Additional Equipment: None  Intra-op Plan:   Post-operative Plan: Extubation in OR  Informed Consent: I have reviewed the patients History and Physical, chart, labs and discussed the procedure including the risks, benefits and alternatives for the proposed anesthesia with the patient or authorized representative who has indicated his/her understanding and acceptance.     Dental advisory given  Plan Discussed with: CRNA  Anesthesia Plan Comments:         Anesthesia Quick Evaluation

## 2023-06-27 NOTE — Anesthesia Procedure Notes (Signed)
Procedure Name: MAC Date/Time: 06/27/2023 9:50 AM  Performed by: Julian Reil, CRNAPre-anesthesia Checklist: Patient identified, Emergency Drugs available, Suction available and Patient being monitored Patient Re-evaluated:Patient Re-evaluated prior to induction Oxygen Delivery Method: Nasal cannula Placement Confirmation: positive ETCO2

## 2023-06-27 NOTE — Op Note (Signed)
Date of procedure: 06/27/23  Pre-operative diagnosis: Visually significant age-related combined cataract, Left Eye (H25.812)  Post-operative diagnosis: Visually significant age-related combined cataract, Left Eye (H25.812)  Procedure: Removal of cataract via phacoemulsification and insertion of intra-ocular lens Laural Benes and Johnson DIB00 +17.5D into the capsular bag of the Left Eye  Attending surgeon: Rudy Jew. Tresten Pantoja, MD, MA  Anesthesia: MAC, Topical Akten  Complications: None  Estimated Blood Loss: <42mL (minimal)  Specimens: None  Implants: As above  Indications:  Visually significant age-related cataract, Left Eye  Procedure:  The patient was seen and identified in the pre-operative area. The operative eye was identified and dilated.  The operative eye was marked.  Topical anesthesia was administered to the operative eye.     The patient was then to the operative suite and placed in the supine position.  A timeout was performed confirming the patient, procedure to be performed, and all other relevant information.   The patient's face was prepped and draped in the usual fashion for intra-ocular surgery.  A lid speculum was placed into the operative eye and the surgical microscope moved into place and focused.  An inferotemporal paracentesis was created using a 20 gauge paracentesis blade.  Shugarcaine was injected into the anterior chamber.  Viscoelastic was injected into the anterior chamber.  A temporal clear-corneal main wound incision was created using a 2.27mm microkeratome.  A continuous curvilinear capsulorrhexis was initiated using an irrigating cystitome and completed using capsulorrhexis forceps.  Hydrodissection and hydrodeliniation were performed.  Viscoelastic was injected into the anterior chamber.  A phacoemulsification handpiece and a chopper as a second instrument were used to remove the nucleus and epinucleus. The irrigation/aspiration handpiece was used to remove any  remaining cortical material.   The capsular bag was reinflated with viscoelastic, checked, and found to be intact.  The intraocular lens was inserted into the capsular bag.  The irrigation/aspiration handpiece was used to remove any remaining viscoelastic.  The clear corneal wound and paracentesis wounds were then hydrated and checked with Weck-Cels to be watertight. 0.53mL of Moxfloxacin was injected into the anterior chamber. The lid-speculum was removed.  The drape was removed.  The patient's face was cleaned with a wet and dry 4x4.    A clear shield was taped over the eye. The patient was taken to the post-operative care unit in good condition, having tolerated the procedure well.  Post-Op Instructions: The patient will follow up at [facility] for a same day post-operative evaluation and will receive all other orders and instructions.

## 2023-06-27 NOTE — Discharge Instructions (Signed)
Please discharge patient when stable, will follow up today with Dr. Carnella Fryman at the [facility] office immediately following discharge.  Leave shield in place until visit.  All paperwork with discharge instructions will be given at the office.  [facility] Address:  [address]  [place], [place] [address]  

## 2023-06-27 NOTE — Transfer of Care (Signed)
Immediate Anesthesia Transfer of Care Note  Patient: Jacqueline [name]  Procedure(s) Performed: CATARACT EXTRACTION PHACO AND INTRAOCULAR LENS PLACEMENT (IOC) (Left: Eye)  Patient Location: Short Stay  Anesthesia Type:MAC  Level of Consciousness: awake, alert , and oriented  Airway & Oxygen Therapy: Patient Spontanous Breathing  Post-op Assessment: Report given to RN and Post -op Vital signs reviewed and stable  Post vital signs: Reviewed and stable  Last Vitals:  Vitals Value Taken Time  BP    Temp 36.7 C 06/27/23 1010  Pulse 67 06/27/23 1010  Resp    SpO2      Last Pain:  Vitals:   06/27/23 1010  TempSrc: Oral  PainSc:       Patients Stated Pain Goal: 6 (06/27/23 0916)  Complications: No notable events documented.

## 2023-06-27 NOTE — Interval H&P Note (Signed)
History and Physical Interval Note:  06/27/2023 9:45 AM  Jacqueline [name]  has presented today for surgery, with the diagnosis of combined forms age related cataract, left eye.  The various methods of treatment have been discussed with the patient and family. After consideration of risks, benefits and other options for treatment, the patient has consented to  Procedure(s) with comments: CATARACT EXTRACTION PHACO AND INTRAOCULAR LENS PLACEMENT (IOC) (Left) - CDE: as a surgical intervention.  The patient's history has been reviewed, patient examined, no change in status, stable for surgery.  I have reviewed the patient's chart and labs.  Questions were answered to the patient's satisfaction.     [name]

## 2023-06-27 NOTE — Anesthesia Postprocedure Evaluation (Signed)
Anesthesia Post Note  Patient: Jacqueline [name]  Procedure(s) Performed: CATARACT EXTRACTION PHACO AND INTRAOCULAR LENS PLACEMENT (IOC) (Left: Eye)  Patient location during evaluation: PACU Anesthesia Type: MAC Level of consciousness: awake and alert Pain management: pain level controlled Vital Signs Assessment: post-procedure vital signs reviewed and stable Respiratory status: spontaneous breathing, nonlabored ventilation, respiratory function stable and patient connected to nasal cannula oxygen Cardiovascular status: stable and blood pressure returned to baseline Postop Assessment: no apparent nausea or vomiting Anesthetic complications: no   There were no known notable events for this encounter.   Last Vitals:  Vitals:   06/27/23 0916 06/27/23 1010  BP: 120/80 101/74  Pulse: 74 67  Resp: (!) 22 18  Temp: 36.7 C 36.7 C  SpO2: 100% 100%    Last Pain:  Vitals:   06/27/23 1010  TempSrc: Oral  PainSc: 0-No pain                 Lonnette Shrode L Rodrecus Belsky

## 2023-06-30 ENCOUNTER — Encounter (HOSPITAL_COMMUNITY): Payer: Self-pay | Admitting: Ophthalmology

## 2023-07-03 DIAGNOSIS — H25811 Combined forms of age-related cataract, right eye: Secondary | ICD-10-CM | POA: Diagnosis not present

## 2023-07-04 ENCOUNTER — Encounter (HOSPITAL_COMMUNITY): Payer: Self-pay

## 2023-07-04 ENCOUNTER — Encounter (HOSPITAL_COMMUNITY)
Admission: RE | Admit: 2023-07-04 | Discharge: 2023-07-04 | Disposition: A | Discharge status: Home / Self Care | Payer: Medicaid Other | Source: Ambulatory Visit | Attending: Ophthalmology

## 2023-07-07 ENCOUNTER — Encounter: Payer: Self-pay | Admitting: Family Medicine

## 2023-07-07 ENCOUNTER — Ambulatory Visit (INDEPENDENT_AMBULATORY_CARE_PROVIDER_SITE_OTHER): Payer: Medicaid Other | Admitting: Family Medicine

## 2023-07-07 VITALS — BP 122/71 | HR 77 | Ht 62.0 in | Wt 173.0 lb

## 2023-07-07 DIAGNOSIS — E559 Vitamin D deficiency, unspecified: Secondary | ICD-10-CM | POA: Diagnosis not present

## 2023-07-07 DIAGNOSIS — I10 Essential (primary) hypertension: Secondary | ICD-10-CM | POA: Diagnosis not present

## 2023-07-07 DIAGNOSIS — R7301 Impaired fasting glucose: Secondary | ICD-10-CM

## 2023-07-07 DIAGNOSIS — E7849 Other hyperlipidemia: Secondary | ICD-10-CM | POA: Diagnosis not present

## 2023-07-07 DIAGNOSIS — E038 Other specified hypothyroidism: Secondary | ICD-10-CM | POA: Diagnosis not present

## 2023-07-07 DIAGNOSIS — R1013 Epigastric pain: Secondary | ICD-10-CM

## 2023-07-07 MED ORDER — AMLODIPINE BESYLATE 5 MG PO TABS: 5.0000 mg | ORAL_TABLET | Freq: Every day | ORAL | 1 refills | Status: DC

## 2023-07-07 NOTE — Patient Instructions (Addendum)
I appreciate the opportunity to provide care to you today!    Follow up:  4 months  Fasting Labs: please stop by the lab during the week to get your blood drawn (CBC, CMP, TSH, Lipid profile, HgA1c, Vit D)  Plan: Obtain an ultrasound to rule out abdominal pathology.  Hydration: Increase hydration to at least 64 ounces daily.  Lifestyle Changes:  Avoid Certain Foods and Drinks: Limit or eliminate coffee, chocolate, onions, peppermint, spicy foods, carbonated beverages, citrus fruits, tomatoes, garlic, alcohol, and fatty foods such as bacon, burgers, sausages, steak, fried foods, and dairy products. Recommended Foods: Increase intake of high-fiber foods, including whole-grain cereals, oatmeal, brown rice, root vegetables, and non-citrus fruits. Opt for healthier, fiber-rich alternatives to support digestion.  When to Seek Immediate Medical Attention Severe, sudden pain, particularly with fever, nausea, vomiting, or jaundice. Signs of shock, such as lightheadedness, rapid heartbeat, or fainting. Persistent pain lasting more than a few hours.  Please follow up with G.I. if your symptoms worsen sore failure improve   Attached with your AVS, you will find valuable resources for self-education. I highly recommend dedicating some time to thoroughly examine them.   Please continue to a heart-healthy diet and increase your physical activities. Try to exercise for at least five days a week.    It was a pleasure to see you and I look forward to continuing to work together on your health and well-being. Please do not hesitate to call the office if you need care or have questions about your care.  In case of emergency, please visit the Emergency Department for urgent care, or contact our clinic at [phone] to schedule an appointment. We're here to help you!   Have a wonderful day and week. With Gratitude, Gilmore Laroche MSN, FNP-BC

## 2023-07-07 NOTE — Progress Notes (Signed)
Established Patient Office Visit  Subjective:  Patient ID: Jacqueline [name], female    DOB: [birth year]  Age: 63 y.o. MRN: [id number]  CC:  Chief Complaint  Patient presents with   Follow-up    9mo follow up for HTN Chronic stomach pain w/ dark urine. X 3 wks.     HPI Jacqueline [name] is a 63 y.o. female with past medical history of hypertension, hyperlipidemia, GERD presents for f/u of  chronic medical conditions with complaints of epigastric abdominal pain. For the details of today's visit, please refer to the assessment and plan.     Past Medical History:  Diagnosis Date   Anemia    Arthritis    per patient, back   Complication of anesthesia 2018   per patient HR dropped during partial hysterectomy surgery   Fibroid uterus    GERD (gastroesophageal reflux disease)    H/O total hysterectomy    Heart murmur    Hyperlipidemia    Borderline   Hypertension    Numbness and tingling in right hand    Pre-diabetes    borderline   Sleep apnea     Past Surgical History:  Procedure Laterality Date   BACK SURGERY     BALLOON DILATION N/A 05/22/2021   Procedure: BALLOON DILATION;  Surgeon: Lanelle Bal, DO;  [facility];  Service: Endoscopy;  Laterality: N/A;   BIOPSY  11/18/2022   Procedure: BIOPSY;  Surgeon: Corbin Ade, MD;  [facility]: [facility];  Service: Endoscopy;;   CATARACT EXTRACTION W/PHACO Left 06/27/2023   Procedure: CATARACT EXTRACTION PHACO AND INTRAOCULAR LENS PLACEMENT (IOC);  Surgeon: [name], MD;  Location: [facility];  Service: Ophthalmology;  Laterality: Left;  CDE: 3.56   CHOLECYSTECTOMY     COLONOSCOPY  2003   Dr. Jena Gauss: Anal canal hemorrhoids   COLONOSCOPY N/A 08/25/2014   Normal colonoscopy.  Next colonoscopy in 2026.  Procedure: COLONOSCOPY;  Surgeon: Corbin Ade, MD;  [facility];  Service: Endoscopy;  Laterality: N/A;  1200   ESOPHAGOGASTRODUODENOSCOPY N/A 08/25/2014   Procedure: ESOPHAGOGASTRODUODENOSCOPY (EGD);   Surgeon: Corbin Ade, MD;  [facility];  Service: Endoscopy;  Laterality: N/A;   ESOPHAGOGASTRODUODENOSCOPY (EGD) WITH PROPOFOL N/A 05/22/2021   Web and distal esophagus, dilated, gastriti, biopsy positive for mildly active H. pylori gastritis.  s procedure: ESOPHAGOGASTRODUODENOSCOPY (EGD) WITH PROPOFOL;  Surgeon: Lanelle Bal, DO;  [facility];  Service: Endoscopy;  Laterality: N/A;  9:15am   ESOPHAGOGASTRODUODENOSCOPY (EGD) WITH PROPOFOL N/A 11/18/2022   Procedure: ESOPHAGOGASTRODUODENOSCOPY (EGD) WITH PROPOFOL;  Surgeon: Corbin Ade, MD;  [facility];  Service: Endoscopy;  Laterality: N/A;  2:15 pm, asa 2   KNEE ARTHROSCOPY WITH MEDIAL MENISECTOMY Left 12/25/2022   Procedure: KNEE ARTHROSCOPY WITH PARTIAL LATERAL MENISCECTOMY DEBRIDEMENT ACL;  Surgeon: Vickki Hearing, MD;  Location: [facility];  Service: Orthopedics;  Laterality: Left;   MALONEY DILATION N/A 11/18/2022   Procedure: Elease Hashimoto DILATION;  Surgeon: Corbin Ade, MD;  [facility];  Service: Endoscopy;  Laterality: N/A;   SALPINGOOPHORECTOMY Bilateral 11/06/2016   Procedure: BILATERAL SALPINGO OOPHORECTOMY;  Surgeon: Lazaro Arms, MD;  Location: [facility];  Service: Gynecology;  Laterality: Bilateral;   SUPRACERVICAL ABDOMINAL HYSTERECTOMY N/A 11/06/2016   Procedure: HYSTERECTOMY SUPRACERVICAL ABDOMINAL;  Surgeon: Lazaro Arms, MD;  Location: [facility];  Service: Gynecology;  Laterality: N/A;   TRANSFORAMINAL LUMBAR INTERBODY FUSION (TLIF) WITH PEDICLE SCREW FIXATION 2 LEVEL  Left 07/28/2019   Procedure: LEFT-SIDED LUMBAR 4- 5, LUMBAR 5 -SACRUM1 TRANSFORAMINAL LUMBAR INTERBODY FUSION WITH INSTRUMENTATION AND ALLOGRAFT;  Surgeon: Estill Bamberg, MD;  Location: [facility];  Service: Orthopedics;  Laterality: Left;   TUBAL LIGATION      Family History  Problem Relation Age of Onset   Breast cancer Maternal Aunt    Hypertension Maternal Aunt    Cancer Maternal Aunt        Breast    Cancer Maternal Grandmother         lymphnodes   Colon cancer Neg Hx     Social History   Socioeconomic History   Marital status: Married    Spouse name: Not on file   Number of children: 3   Years of education: Not on file   Highest education level: Not on file  Occupational History   Occupation: PT at [employer]  Tobacco Use   Smoking status: Never   Smokeless tobacco: Never   Tobacco comments:    Never smoked  Vaping Use   Vaping status: Never Used  Substance and Sexual Activity   Alcohol use: No    Alcohol/week: 0.0 standard drinks of alcohol   Drug use: No   Sexual activity: Yes    Birth control/protection: Post-menopausal, Surgical  Other Topics Concern   Not on file  Social History Narrative   Lives with her spouse, work part at [employer]    Social Drivers of Health   Financial Resource Strain: Not on file  Food Insecurity: Not on file  Transportation Needs: Not on file  Physical Activity: Not on file  Stress: Not on file  Social Connections: Not on file  Intimate Partner Violence: Not on file    Outpatient Medications Prior to Visit  Medication Sig Dispense Refill   acyclovir (ZOVIRAX) 400 MG tablet Take 400 mg by mouth 3 (three) times daily as needed (fever blisters).     azelastine (ASTELIN) 0.1 % nasal spray Place 1 spray into both nostrils 2 (two) times daily as needed for rhinitis. Use in each nostril as directed     diclofenac (VOLTAREN) 75 MG EC tablet TAKE 1 TABLET BY MOUTH TWICE DAILY WITH A MEAL 60 tablet 5   diclofenac Sodium (VOLTAREN) 1 % GEL Apply 4 g topically 4 (four) times daily. To knee 500 g 1   DULoxetine (CYMBALTA) 30 MG capsule Take 1 capsule by mouth once daily 90 capsule 0   estradiol (ESTRACE) 0.1 MG/GM vaginal cream INSERT 2 GRAMS VAGINALLY AS DIRECTED EVERY OTHER NIGHT 43 g 5   ezetimibe (ZETIA) 10 MG tablet Take 1 tablet (10 mg total) by mouth daily. 90 tablet 3   fluticasone (FLONASE) 50 MCG/ACT nasal spray Place 1 spray into  both nostrils daily. (Patient taking differently: Place 1 spray into both nostrils daily as needed for allergies.) 16 g 6   ibuprofen (ADVIL) 800 MG tablet Take 1 tablet (800 mg total) by mouth every 8 (eight) hours as needed. 90 tablet 1   methocarbamol (ROBAXIN) 750 MG tablet Take 1 tablet by mouth 4 times daily 30 tablet 0   Multiple Vitamin (MULTIVITAMIN WITH MINERALS) TABS tablet Take 1 tablet by mouth daily with supper. One-A-Day Multivitamin     pantoprazole (PROTONIX) 40 MG tablet Take 1 tablet (40 mg total) by mouth 2 (two) times daily before a meal. (Patient taking differently: Take 40 mg by mouth daily.) 180 tablet 0   pantoprazole (PROTONIX) 40 MG tablet Take 1 tablet (40  mg total) by mouth 2 (two) times daily before a meal. For 14 days while taking antibiotics for H.pylori, then resume to once daily. 60 tablet 3   traZODone (DESYREL) 50 MG tablet TAKE 1/2 TO 1 (ONE-HALF TO ONE) TABLET BY MOUTH AT BEDTIME AS NEEDED FOR SLEEP 30 tablet 0   amLODipine (NORVASC) 5 MG tablet Take 1 tablet by mouth once daily 90 tablet 0   atorvastatin (LIPITOR) 20 MG tablet Take 1 tablet by mouth once daily 90 tablet 0   No facility-administered medications prior to visit.    No Known Allergies  ROS Review of Systems  Constitutional:  Negative for chills and fever.  Eyes:  Negative for visual disturbance.  Respiratory:  Negative for chest tightness and shortness of breath.   Gastrointestinal:  Positive for abdominal pain. Negative for abdominal distention, blood in stool, diarrhea and nausea.  Genitourinary:  Negative for dysuria, frequency and urgency.  Neurological:  Negative for dizziness and headaches.      Objective:    Physical Exam HENT:     Head: Normocephalic.     Mouth/Throat:     Mouth: Mucous membranes are moist.  Cardiovascular:     Rate and Rhythm: Normal rate.     Heart sounds: Normal heart sounds.  Pulmonary:     Effort: Pulmonary effort is normal.     Breath sounds:  Normal breath sounds.  Abdominal:     General: Bowel sounds are normal. There is no distension.     Palpations: There is no mass.     Tenderness: There is abdominal tenderness in the epigastric area. There is no right CVA tenderness, left CVA tenderness, guarding or rebound.     Hernia: No hernia is present.  Neurological:     Mental Status: She is alert.     BP 122/71   Pulse 77   Ht 5\' 2"  (1.575 m)   Wt 173 lb 0.6 oz (78.5 kg)   LMP 10/26/2016 (Exact Date)   SpO2 93%   BMI 31.65 kg/m  Wt Readings from Last 3 Encounters:  07/07/23 173 lb 0.6 oz (78.5 kg)  07/04/23 182 lb 15.7 oz (83 kg)  03/05/23 183 lb (83 kg)    Lab Results  Component Value Date   TSH 0.973 03/06/2023   Lab Results  Component Value Date   WBC 4.9 03/06/2023   HGB 12.0 03/06/2023   HCT 36.0 03/06/2023   MCV 94 03/06/2023   PLT 197 03/06/2023   Lab Results  Component Value Date   NA 139 03/06/2023   K 3.9 03/06/2023   CO2 23 03/06/2023   GLUCOSE 94 03/06/2023   BUN 10 03/06/2023   CREATININE 0.95 03/06/2023   BILITOT 0.8 03/31/2023   ALKPHOS 110 03/31/2023   AST 193 (H) 03/31/2023   ALT 294 (H) 03/31/2023   PROT 6.6 03/31/2023   ALBUMIN 4.0 03/31/2023   CALCIUM 9.2 03/06/2023   ANIONGAP 8 12/24/2022   EGFR 69 03/06/2023   Lab Results  Component Value Date   CHOL 145 03/06/2023   Lab Results  Component Value Date   HDL 52 03/06/2023   Lab Results  Component Value Date   LDLCALC 79 03/06/2023   Lab Results  Component Value Date   TRIG 72 03/06/2023   Lab Results  Component Value Date   CHOLHDL 2.8 03/06/2023   Lab Results  Component Value Date   HGBA1C 5.8 (H) 03/06/2023      Assessment & Plan:  Primary  hypertension Assessment & Plan: Controlled Encouraged low-sodium diet with increased physical activity Encouraged to continue take amlodipine 5 mg daily BP Readings from Last 3 Encounters:  07/07/23 122/71  06/27/23 101/74  03/05/23 119/76     Orders: -      amLODIPine Besylate; Take 1 tablet (5 mg total) by mouth daily.  Dispense: 90 tablet; Refill: 1  Other hyperlipidemia Assessment & Plan: Encouraged reducing her intake of greasy, fatty, starchy foods with increased activity Encouraged to continue taking Ezetimibe 10 mg daily Lab Results  Component Value Date   CHOL 145 03/06/2023   HDL 52 03/06/2023   LDLCALC 79 03/06/2023   TRIG 72 03/06/2023   CHOLHDL 2.8 03/06/2023     Orders: -     Lipid panel -     CMP14+EGFR -     CBC with Differential/Platelet  Epigastric pain Assessment & Plan: The patient complains of sharp, intermittent pain in the epigastric region, lasting for about 60 seconds and subsiding afterward. She denies nausea, vomiting, diarrhea, fever, or chills. The pain is independent of food intake. She also notes having dark urine and reports drinking about 3 to 4 bottles of water daily. I encouraged the patient to increase her fluid intake to at least 64 ounces of water daily.  The patient has a history of GERD and reports compliance with Protonix 40 mg daily. An ultrasound should be obtained to rule out abdominal pathology. Of note, the patient has a history of cholecystectomy.  Lifestyle Changes:  Avoid Certain Foods and Drinks: Limit or eliminate coffee, chocolate, onions, peppermint, spicy foods, carbonated beverages, citrus fruits, tomatoes, garlic, alcohol, and fatty foods such as bacon, burgers, sausages, steak, fried foods, and dairy products. Recommended Foods: Increase intake of high-fiber foods, including whole-grain cereals, oatmeal, brown rice, root vegetables, and non-citrus fruits. Opt for healthier, fiber-rich alternatives to support digestion. When to Seek Immediate Medical Attention:  Severe, sudden pain, particularly with fever, nausea, vomiting, or jaundice. Signs of shock, such as lightheadedness, rapid heartbeat, or fainting. Persistent pain lasting more than a few hours. Please follow up with  a gastroenterologist if your symptoms worsen or fail to improve.   Orders: -     US Abdomen Complete  IFG (impaired fasting glucose) -     Hemoglobin A1c  Vitamin D deficiency -     VITAMIN D 25 Hydroxy (Vit-D Deficiency, Fractures)  TSH (thyroid-stimulating hormone deficiency) -     TSH + free T4  Note: This chart has been completed using Engineer, civil (consulting) software, and while attempts have been made to ensure accuracy, certain words and phrases may not be transcribed as intended.    Follow-up: Return in about 4 months (around 11/04/2023).   Gilmore Laroche, FNP

## 2023-07-07 NOTE — Assessment & Plan Note (Signed)
Encouraged reducing her intake of greasy, fatty, starchy foods with increased activity Encouraged to continue taking Ezetimibe 10 mg daily Lab Results  Component Value Date   CHOL 145 03/06/2023   HDL 52 03/06/2023   LDLCALC 79 03/06/2023   TRIG 72 03/06/2023   CHOLHDL 2.8 03/06/2023

## 2023-07-07 NOTE — Assessment & Plan Note (Signed)
The patient complains of sharp, intermittent pain in the epigastric region, lasting for about 60 seconds and subsiding afterward. She denies nausea, vomiting, diarrhea, fever, or chills. The pain is independent of food intake. She also notes having dark urine and reports drinking about 3 to 4 bottles of water daily. I encouraged the patient to increase her fluid intake to at least 64 ounces of water daily.  The patient has a history of GERD and reports compliance with Protonix 40 mg daily. An ultrasound should be obtained to rule out abdominal pathology. Of note, the patient has a history of cholecystectomy.  Lifestyle Changes:  Avoid Certain Foods and Drinks: Limit or eliminate coffee, chocolate, onions, peppermint, spicy foods, carbonated beverages, citrus fruits, tomatoes, garlic, alcohol, and fatty foods such as bacon, burgers, sausages, steak, fried foods, and dairy products. Recommended Foods: Increase intake of high-fiber foods, including whole-grain cereals, oatmeal, brown rice, root vegetables, and non-citrus fruits. Opt for healthier, fiber-rich alternatives to support digestion. When to Seek Immediate Medical Attention:  Severe, sudden pain, particularly with fever, nausea, vomiting, or jaundice. Signs of shock, such as lightheadedness, rapid heartbeat, or fainting. Persistent pain lasting more than a few hours. Please follow up with a gastroenterologist if your symptoms worsen or fail to improve.

## 2023-07-07 NOTE — Assessment & Plan Note (Signed)
Controlled Encouraged low-sodium diet with increased physical activity Encouraged to continue take amlodipine 5 mg daily BP Readings from Last 3 Encounters:  07/07/23 122/71  06/27/23 101/74  03/05/23 119/76

## 2023-07-10 NOTE — Progress Notes (Signed)
Patient called short stay today. Answered all her questions about cataract surgery for tomorrow.

## 2023-07-10 NOTE — H&P (Signed)
Surgical History & Physical  Patient Name: Jacqueline [name]  DOB: Mar 27, 1961  Surgery: Cataract extraction with intraocular lens implant phacoemulsification; Right Eye Surgeon: [name] MD Surgery Date: 07/11/2023 Pre-Op Date: 07/03/2023  HPI: A 86 Yr. old female patient present for 6 day post op OS. Patient states vision is still blurry but better. She had a FB sensation OS twice since the surgery, she had called about it. Better now. Using Combo TID OS. Difficulties driving reading road signs and glare at night due to vision. Unable to read fine print. This is negatively affecting the patient's quality of life and the patient is unable to function adequately in life with the current level of vision. Patient would like to proceed with cataract sx OD. HPI Completed by Dr. [name]  Medical History: Cataracts  Arthritis High Blood Pressure  Review of Systems Negative Allergic/Immunologic Hypertension Cardiovascular Negative Constitutional Negative Endocrine Cataracts Eyes Negative Gastrointestinal Negative Genitourinary Negative Hemotologic/Lymphatic Negative Integumentary Arthritis Musculoskeletal Negative Neurological Negative Psychiatry Negative Respiratory  Social Never smoked    Medication Prednisolone-moxiflox-bromfen,  methocarbamol ,  amlodipine ,  trazodone ,  estradiol ,  prednisone ,  ibuprofen ,  pantoprazole ,  diclofenac sodium ,  diazepam ,  duloxetine ,  ezetimibe   Sx/Procedures Phaco c IOL OS,  Knee, Back Surgery  Drug Allergies  NKDA  History & Physical: Heent: cataract NECK: supple without bruits LUNGS: lungs clear to auscultation CV: regular rate and rhythm Abdomen: soft and non-tender  Impression & Plan: Assessment: 1.  CATARACT EXTRACTION STATUS; Left Eye (Z98.42) 2.  COMBINED FORMS AGE RELATED CATARACT; Right Eye (H25.811) 3.  NUCLEAR SCLEROSIS AGE RELATED; Right Eye (H25.11)  Plan: 1.  1 week after cataract surgery. Doing well  with improved vision and normal eye pressure. Call with any problems or concerns. Continue Pred-Moxi-Brom 2x/day for 3 more weeks.  2.  Cataract accounts for the patient's decreased vision. This visual impairment is not correctable with a tolerable change in glasses or contact lenses. Cataract surgery with an implantation of a new lens should significantly improve the visual and functional status of the patient. Discussed all risks, benefits, alternatives, and potential complications. Discussed the procedures and recovery. Patient desires to have surgery. A-scan ordered and performed today for intra-ocular lens calculations. The surgery will be performed in order to improve vision for driving, reading, and for eye examinations. Recommend phacoemulsification with intra-ocular lens. Recommend Dextenza for post-operative pain and inflammation. Right Eye. Surgery required to correct imbalance of vision. Dilates poorly - shugarcaine by protocol. Malyugin Ring. Omidira.  3. See above

## 2023-07-11 ENCOUNTER — Ambulatory Visit (HOSPITAL_COMMUNITY): Payer: Medicaid Other | Admitting: Anesthesiology

## 2023-07-11 ENCOUNTER — Ambulatory Visit (HOSPITAL_COMMUNITY): Payer: Medicaid Other

## 2023-07-11 ENCOUNTER — Ambulatory Visit (HOSPITAL_COMMUNITY)
Admission: RE | Admit: 2023-07-11 | Discharge: 2023-07-11 | Disposition: A | Discharge status: Home / Self Care | Payer: Medicaid Other | Attending: Ophthalmology | Admitting: Ophthalmology

## 2023-07-11 ENCOUNTER — Encounter (HOSPITAL_COMMUNITY): Payer: Self-pay

## 2023-07-11 ENCOUNTER — Encounter (HOSPITAL_COMMUNITY): Payer: Self-pay | Admitting: Ophthalmology

## 2023-07-11 ENCOUNTER — Encounter (HOSPITAL_COMMUNITY)
Admission: RE | Disposition: A | Discharge status: Home / Self Care | Payer: Self-pay | Source: Home / Self Care | Attending: Ophthalmology

## 2023-07-11 DIAGNOSIS — M1990 Unspecified osteoarthritis, unspecified site: Secondary | ICD-10-CM | POA: Insufficient documentation

## 2023-07-11 DIAGNOSIS — H25811 Combined forms of age-related cataract, right eye: Secondary | ICD-10-CM

## 2023-07-11 DIAGNOSIS — K219 Gastro-esophageal reflux disease without esophagitis: Secondary | ICD-10-CM | POA: Insufficient documentation

## 2023-07-11 DIAGNOSIS — G4733 Obstructive sleep apnea (adult) (pediatric): Secondary | ICD-10-CM

## 2023-07-11 DIAGNOSIS — I10 Essential (primary) hypertension: Secondary | ICD-10-CM | POA: Insufficient documentation

## 2023-07-11 DIAGNOSIS — E785 Hyperlipidemia, unspecified: Secondary | ICD-10-CM | POA: Diagnosis not present

## 2023-07-11 DIAGNOSIS — G4730 Sleep apnea, unspecified: Secondary | ICD-10-CM | POA: Diagnosis not present

## 2023-07-11 DIAGNOSIS — Z79899 Other long term (current) drug therapy: Secondary | ICD-10-CM | POA: Insufficient documentation

## 2023-07-11 HISTORY — PX: CATARACT EXTRACTION W/PHACO: SHX586

## 2023-07-11 LAB — GLUCOSE, CAPILLARY: Glucose-Capillary: 97 mg/dL (ref 70–99)

## 2023-07-11 SURGERY — PHACOEMULSIFICATION, CATARACT, WITH IOL INSERTION
Anesthesia: Monitor Anesthesia Care | Site: Eye | Laterality: Right

## 2023-07-11 MED ORDER — LIDOCAINE HCL (PF) 1 % IJ SOLN
INTRAOCULAR | Status: DC | PRN
  Administered 2023-07-11: 1 mL via OPHTHALMIC

## 2023-07-11 MED ORDER — LIDOCAINE HCL 3.5 % OP GEL
1.0000 | Freq: Once | OPHTHALMIC | Status: AC
  Administered 2023-07-11: 1 via OPHTHALMIC

## 2023-07-11 MED ORDER — MIDAZOLAM HCL 2 MG/2ML IJ SOLN
INTRAMUSCULAR | Status: DC | PRN
  Administered 2023-07-11: 2 mg via INTRAVENOUS

## 2023-07-11 MED ORDER — MOXIFLOXACIN HCL 5 MG/ML IO SOLN
INTRAOCULAR | Status: DC | PRN
  Administered 2023-07-11: .3 mL via INTRACAMERAL

## 2023-07-11 MED ORDER — MIDAZOLAM HCL 2 MG/2ML IJ SOLN
INTRAMUSCULAR | Status: AC
  Filled 2023-07-11: qty 2

## 2023-07-11 MED ORDER — SODIUM CHLORIDE 0.9% FLUSH
INTRAVENOUS | Status: DC | PRN
  Administered 2023-07-11: 10 mL via INTRAVENOUS

## 2023-07-11 MED ORDER — TETRACAINE HCL 0.5 % OP SOLN
1.0000 [drp] | OPHTHALMIC | Status: AC | PRN
  Administered 2023-07-11 (×3): 1 [drp] via OPHTHALMIC

## 2023-07-11 MED ORDER — SODIUM HYALURONATE 23MG/ML IO SOSY
PREFILLED_SYRINGE | INTRAOCULAR | Status: DC | PRN
  Administered 2023-07-11: .6 mL via INTRAOCULAR

## 2023-07-11 MED ORDER — SODIUM HYALURONATE 10 MG/ML IO SOLUTION
PREFILLED_SYRINGE | INTRAOCULAR | Status: DC | PRN
  Administered 2023-07-11: .85 mL via INTRAOCULAR

## 2023-07-11 MED ORDER — POVIDONE-IODINE 5 % OP SOLN
OPHTHALMIC | Status: DC | PRN
  Administered 2023-07-11: 1 via OPHTHALMIC

## 2023-07-11 MED ORDER — BSS IO SOLN
INTRAOCULAR | Status: DC | PRN
  Administered 2023-07-11: 15 mL via INTRAOCULAR

## 2023-07-11 MED ORDER — FENTANYL CITRATE (PF) 100 MCG/2ML IJ SOLN
INTRAMUSCULAR | Status: AC
  Filled 2023-07-11: qty 2

## 2023-07-11 MED ORDER — FENTANYL CITRATE (PF) 100 MCG/2ML IJ SOLN
INTRAMUSCULAR | Status: DC | PRN
  Administered 2023-07-11: 50 ug via INTRAVENOUS

## 2023-07-11 MED ORDER — EPINEPHRINE PF 1 MG/ML IJ SOLN
INTRAOCULAR | Status: DC | PRN
  Administered 2023-07-11: 500 mL

## 2023-07-11 MED ORDER — STERILE WATER FOR IRRIGATION IR SOLN
Status: DC | PRN
  Administered 2023-07-11: 25 mL

## 2023-07-11 MED ORDER — PHENYLEPHRINE HCL 2.5 % OP SOLN
1.0000 [drp] | OPHTHALMIC | Status: AC | PRN
  Administered 2023-07-11 (×3): 1 [drp] via OPHTHALMIC

## 2023-07-11 MED ORDER — TROPICAMIDE 1 % OP SOLN
1.0000 [drp] | OPHTHALMIC | Status: AC | PRN
  Administered 2023-07-11 (×3): 1 [drp] via OPHTHALMIC

## 2023-07-11 SURGICAL SUPPLY — 13 items
CATARACT SUITE SIGHTPATH (MISCELLANEOUS) ×1
CLOTH BEACON ORANGE TIMEOUT ST (SAFETY) ×1 IMPLANT
EYE SHIELD UNIVERSAL CLEAR (GAUZE/BANDAGES/DRESSINGS) IMPLANT
FEE CATARACT SUITE SIGHTPATH (MISCELLANEOUS) ×1 IMPLANT
GLOVE BIOGEL PI IND STRL 6.5 (GLOVE) IMPLANT
GLOVE BIOGEL PI IND STRL 7.0 (GLOVE) ×2 IMPLANT
LENS IOL TECNIS EYHANCE 18.0 (Intraocular Lens) IMPLANT
NDL HYPO 18GX1.5 BLUNT FILL (NEEDLE) ×1 IMPLANT
NEEDLE HYPO 18GX1.5 BLUNT FILL (NEEDLE) ×1
PAD ARMBOARD 7.5X6 YLW CONV (MISCELLANEOUS) ×1 IMPLANT
SYR TB 1ML LL NO SAFETY (SYRINGE) ×1 IMPLANT
TAPE SURG TRANSPORE 1 IN (GAUZE/BANDAGES/DRESSINGS) IMPLANT
WATER STERILE IRR 250ML POUR (IV SOLUTION) ×1 IMPLANT

## 2023-07-11 NOTE — Anesthesia Postprocedure Evaluation (Signed)
Anesthesia Post Note  Patient: Jacqueline [name]  Procedure(s) Performed: CATARACT EXTRACTION PHACO AND INTRAOCULAR LENS PLACEMENT (IOC) (Right: Eye)  Patient location during evaluation: Phase II Anesthesia Type: MAC Level of consciousness: awake Pain management: pain level controlled Vital Signs Assessment: post-procedure vital signs reviewed and stable Respiratory status: spontaneous breathing and respiratory function stable Cardiovascular status: blood pressure returned to baseline and stable Postop Assessment: no headache and no apparent nausea or vomiting Anesthetic complications: no Comments: Late entry   No notable events documented.   Last Vitals:  Vitals:   07/11/23 0653 07/11/23 0844  BP: 99/74 109/74  Pulse: 74 69  Resp: 13 16  Temp: 36.8 C 36.8 C  SpO2: 97% 100%    Last Pain:  Vitals:   07/11/23 0844  TempSrc: Oral  PainSc: 0-No pain                 Windell Norfolk

## 2023-07-11 NOTE — Discharge Instructions (Addendum)
 Please discharge patient when stable, will follow up today with Dr. June Leap at the [facility] office immediately following discharge.  Leave shield in place until visit.  All paperwork with discharge instructions will be given at the office.  [facility] Address:  [address]  [place], [place] [address]

## 2023-07-11 NOTE — Transfer of Care (Signed)
Immediate Anesthesia Transfer of Care Note  Patient: Jacqueline [name]  Procedure(s) Performed: CATARACT EXTRACTION PHACO AND INTRAOCULAR LENS PLACEMENT (IOC) (Right: Eye)  Patient Location: PACU  Anesthesia Type:MAC  Level of Consciousness: awake, alert , and oriented  Airway & Oxygen Therapy: Patient Spontanous Breathing  Post-op Assessment: Report given to RN and Post -op Vital signs reviewed and stable  Post vital signs: Reviewed and stable  Last Vitals:  Vitals Value Taken Time  BP 115/79   Temp 98   Pulse 69   Resp 16   SpO2 96     Last Pain:  Vitals:   07/11/23 0653  TempSrc: Oral  PainSc: 0-No pain         Complications: No notable events documented.

## 2023-07-11 NOTE — Interval H&P Note (Signed)
History and Physical Interval Note:  07/11/2023 8:14 AM  Jacqueline [name]  has presented today for surgery, with the diagnosis of combined forms age related cataract, right eye.  The various methods of treatment have been discussed with the patient and family. After consideration of risks, benefits and other options for treatment, the patient has consented to  Procedure(s) with comments: CATARACT EXTRACTION PHACO AND INTRAOCULAR LENS PLACEMENT (IOC) (Right) - CDE: as a surgical intervention.  The patient's history has been reviewed, patient examined, no change in status, stable for surgery.  I have reviewed the patient's chart and labs.  Questions were answered to the patient's satisfaction.     [name]

## 2023-07-11 NOTE — Op Note (Signed)
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 Sitara Cashwell, MD, MA  Anesthesia: MAC, Topical Akten  Complications: None  Estimated Blood Loss: <73mL (minimal)  Specimens: None  Implants: As above  Indications:  Visually significant age-related cataract, Right Eye  Procedure:  The patient was seen and identified in the pre-operative area. The operative eye was identified and dilated.  The operative eye was marked.  Topical anesthesia was administered to the operative eye.     The patient was then to the operative suite and placed in the supine position.  A timeout was performed confirming the patient, procedure to be performed, and all other relevant information.   The patient's face was prepped and draped in the usual fashion for intra-ocular surgery.  A lid speculum was placed into the operative eye and the surgical microscope moved into place and focused.  A superotemporal paracentesis was created using a 20 gauge paracentesis blade.  Shugarcaine was injected into the anterior chamber.  Viscoelastic was injected into the anterior chamber.  A temporal clear-corneal main wound incision was created using a 2.54mm microkeratome.  A continuous curvilinear capsulorrhexis was initiated using an irrigating cystitome and completed using capsulorrhexis forceps.  Hydrodissection and hydrodeliniation were performed.  Viscoelastic was injected into the anterior chamber.  A phacoemulsification handpiece and a chopper as a second instrument were used to remove the nucleus and epinucleus. The irrigation/aspiration handpiece was used to  remove any remaining cortical material.   The capsular bag was reinflated with viscoelastic, checked, and found to be intact.  The intraocular lens was inserted into the capsular bag.  The irrigation/aspiration handpiece was used to remove any remaining viscoelastic.  The clear corneal wound and paracentesis wounds were then hydrated and checked with Weck-Cels to be watertight. 0.72mL of Moxfloxacin was injected into the anterior chamber. The lid-speculum was removed.  The drape was removed.  The patient's face was cleaned with a wet and dry 4x4. A clear shield was taped over the eye. The patient was taken to the post-operative care unit in good condition, having tolerated the procedure well.  Post-Op Instructions: The patient will follow up at [facility] for a same day post-operative evaluation and will receive all other orders and instructions.

## 2023-07-11 NOTE — Anesthesia Preprocedure Evaluation (Signed)
Anesthesia Evaluation  Patient identified by MRN, date of birth, ID band Patient awake    Reviewed: Allergy & Precautions, H&P , NPO status , Patient's Chart, lab work & pertinent test results  History of Anesthesia Complications (+) history of anesthetic complications  Airway Mallampati: II  TM Distance: >3 FB Neck ROM: Full    Dental  (+) Missing, Dental Advisory Given Teeth mostly intact:   Pulmonary sleep apnea    Pulmonary exam normal breath sounds clear to auscultation       Cardiovascular Exercise Tolerance: Good hypertension, Pt. on medications Normal cardiovascular exam+ Valvular Problems/Murmurs  Rhythm:Regular Rate:Normal  16-Aug-2022 17:05:03 [facility]-ED ROUTINE RECORD December 10, 1960 (61 yr) Female Black Vent. 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) Abnormal ECG When compared with ECG of 16-Aug-2022 15:22, QRS axis Shifted right Inverted T waves have replaced nonspecific T wave abnormality in Inferior leads Confirmed by Meridee Score [phone]) on 08/16/2022 5:26:09 PM   Neuro/Psych  Neuromuscular disease  negative psych ROS   GI/Hepatic Neg liver ROS,GERD  Medicated and Controlled,,  Endo/Other  negative endocrine ROS    Renal/GU negative Renal ROS  negative genitourinary   Musculoskeletal  (+) Arthritis , Osteoarthritis,    Abdominal Normal abdominal exam  (+)   Peds negative pediatric ROS (+)  Hematology  (+) Blood dyscrasia, anemia   Anesthesia Other Findings   Reproductive/Obstetrics negative OB ROS                              Anesthesia Physical Anesthesia Plan  ASA: 3  Anesthesia Plan: MAC   Post-op Pain Management:    Induction:   PONV Risk Score and Plan:   Airway Management Planned: Nasal Cannula and  Natural Airway  Additional Equipment: None  Intra-op Plan:   Post-operative Plan: Extubation in OR  Informed Consent: I have reviewed the patients History and Physical, chart, labs and discussed the procedure including the risks, benefits and alternatives for the proposed anesthesia with the patient or authorized representative who has indicated his/her understanding and acceptance.     Dental advisory given  Plan Discussed with: CRNA  Anesthesia Plan Comments:          Anesthesia Quick Evaluation

## 2023-07-12 DIAGNOSIS — Z419 Encounter for procedure for purposes other than remedying health state, unspecified: Secondary | ICD-10-CM | POA: Diagnosis not present

## 2023-07-13 ENCOUNTER — Encounter (HOSPITAL_COMMUNITY): Payer: Self-pay | Admitting: Ophthalmology

## 2023-07-14 DIAGNOSIS — E7849 Other hyperlipidemia: Secondary | ICD-10-CM | POA: Diagnosis not present

## 2023-07-14 DIAGNOSIS — R7301 Impaired fasting glucose: Secondary | ICD-10-CM | POA: Diagnosis not present

## 2023-07-14 DIAGNOSIS — E038 Other specified hypothyroidism: Secondary | ICD-10-CM | POA: Diagnosis not present

## 2023-07-14 DIAGNOSIS — E559 Vitamin D deficiency, unspecified: Secondary | ICD-10-CM | POA: Diagnosis not present

## 2023-07-15 LAB — LIPID PANEL
Chol/HDL Ratio: 4.6 {ratio} — ABNORMAL HIGH (ref 0.0–4.4)
Cholesterol, Total: 158 mg/dL (ref 100–199)
HDL: 34 mg/dL — ABNORMAL LOW (ref >39)
LDL Chol Calc (NIH): 105 mg/dL — ABNORMAL HIGH (ref 0–99)
Triglycerides: 103 mg/dL (ref 0–149)
VLDL Cholesterol Cal: 19 mg/dL (ref 5–40)

## 2023-07-15 LAB — CBC WITH DIFFERENTIAL/PLATELET
Basophils Absolute: 0.1 10*3/uL (ref 0.0–0.2)
Basos: 2 %
EOS (ABSOLUTE): 0.5 10*3/uL — ABNORMAL HIGH (ref 0.0–0.4)
Eos: 9 %
Hematocrit: 36.8 % (ref 34.0–46.6)
Hemoglobin: 12 g/dL (ref 11.1–15.9)
Immature Grans (Abs): 0 10*3/uL (ref 0.0–0.1)
Immature Granulocytes: 0 %
Lymphocytes Absolute: 2.3 10*3/uL (ref 0.7–3.1)
Lymphs: 38 %
MCH: 30.8 pg (ref 26.6–33.0)
MCHC: 32.6 g/dL (ref 31.5–35.7)
MCV: 95 fL (ref 79–97)
Monocytes Absolute: 0.6 10*3/uL (ref 0.1–0.9)
Monocytes: 10 %
Neutrophils Absolute: 2.5 10*3/uL (ref 1.4–7.0)
Neutrophils: 41 %
Platelets: 254 10*3/uL (ref 150–450)
RBC: 3.89 x10E6/uL (ref 3.77–5.28)
RDW: 14.4 % (ref 11.7–15.4)
WBC: 6 10*3/uL (ref 3.4–10.8)

## 2023-07-15 LAB — HEMOGLOBIN A1C
Est. average glucose Bld gHb Est-mCnc: 108 mg/dL
Hgb A1c MFr Bld: 5.4 % (ref 4.8–5.6)

## 2023-07-15 LAB — CMP14+EGFR
ALT: 461 [IU]/L — ABNORMAL HIGH (ref 0–32)
AST: 421 [IU]/L — ABNORMAL HIGH (ref 0–40)
Albumin: 3.9 g/dL (ref 3.9–4.9)
Alkaline Phosphatase: 241 [IU]/L — ABNORMAL HIGH (ref 44–121)
BUN/Creatinine Ratio: 13 (ref 12–28)
BUN: 12 mg/dL (ref 8–27)
Bilirubin Total: 1.5 mg/dL — ABNORMAL HIGH (ref 0.0–1.2)
CO2: 24 mmol/L (ref 20–29)
Calcium: 9.6 mg/dL (ref 8.7–10.3)
Chloride: 100 mmol/L (ref 96–106)
Creatinine, Ser: 0.93 mg/dL (ref 0.57–1.00)
Globulin, Total: 3.3 g/dL (ref 1.5–4.5)
Glucose: 81 mg/dL (ref 70–99)
Potassium: 4.2 mmol/L (ref 3.5–5.2)
Sodium: 136 mmol/L (ref 134–144)
Total Protein: 7.2 g/dL (ref 6.0–8.5)
eGFR: 70 mL/min/{1.73_m2} (ref >59)

## 2023-07-15 LAB — VITAMIN D 25 HYDROXY (VIT D DEFICIENCY, FRACTURES): Vit D, 25-Hydroxy: 50.9 ng/mL (ref 30.0–100.0)

## 2023-07-15 LAB — TSH+FREE T4
Free T4: 1.17 ng/dL (ref 0.82–1.77)
TSH: 0.928 u[IU]/mL (ref 0.450–4.500)

## 2023-07-20 ENCOUNTER — Encounter: Payer: Self-pay | Admitting: Family Medicine

## 2023-07-20 ENCOUNTER — Other Ambulatory Visit: Payer: Self-pay | Admitting: Family Medicine

## 2023-07-20 DIAGNOSIS — R748 Abnormal levels of other serum enzymes: Secondary | ICD-10-CM

## 2023-07-21 ENCOUNTER — Encounter: Payer: Self-pay | Admitting: *Deleted

## 2023-07-22 DIAGNOSIS — M47816 Spondylosis without myelopathy or radiculopathy, lumbar region: Secondary | ICD-10-CM | POA: Diagnosis not present

## 2023-07-24 ENCOUNTER — Telehealth: Payer: Self-pay | Admitting: Orthopaedic Surgery

## 2023-07-24 NOTE — Telephone Encounter (Signed)
Dr. Sanjuan Dame pt - spoke w/the pt, she stated that Dr. Hilda Lias was sending her for a MRI and she wants to make sure it's for both shoulders.  [phone]

## 2023-07-27 ENCOUNTER — Other Ambulatory Visit: Payer: Self-pay | Admitting: Family Medicine

## 2023-07-27 DIAGNOSIS — G8929 Other chronic pain: Secondary | ICD-10-CM

## 2023-07-30 NOTE — Telephone Encounter (Signed)
Jacqueline [name] asked me to call patient said she is Dr [name] patient.  I left message for her to call back   Dr Hilda Lias ordered MRI of her neck on 09/17/22 but not shoulders   She probably needs to be seen to proceed with something if she is having shoulder pain  I asked her to call back if she does can one of you please let her know ?

## 2023-07-31 NOTE — Telephone Encounter (Signed)
Per [facility] patient has appointment on 2/27

## 2023-08-02 ENCOUNTER — Other Ambulatory Visit: Payer: Self-pay | Admitting: Gastroenterology

## 2023-08-04 NOTE — Telephone Encounter (Signed)
Pt needs appt before any more refills

## 2023-08-05 NOTE — Progress Notes (Unsigned)
   Established Patient Office Visit   Subjective  Patient ID: ADDALYNE VANDEHEI, female    DOB: [birth year]  Age: 63 y.o. MRN: [id number]  No chief complaint on file.   She  has a past medical history of Anemia, Arthritis, Complication of anesthesia (2018), Fibroid uterus, GERD (gastroesophageal reflux disease), H/O total hysterectomy, Heart murmur, Hyperlipidemia, Hypertension, Numbness and tingling in right hand, Pre-diabetes, and Sleep apnea.   Patient complains of {pneum rfv:14244}. Patient describes symptoms of {pneumonia symptoms:12520}. Symptoms began {0-10:33138} {unit:11::"days"} ago and are {course:17::"unchanged"} since that time. Patient denies {pneumonia denies:14121}. Treatment thus far includes {pneumonia tx to date:14122} Past pulmonary history is significant for {resp history:412}     ROS    Objective:     LMP 10/26/2016 (Exact Date)  {Vitals History (Optional):23777}  Physical Exam   No results found for any visits on 08/06/23.  The 10-year ASCVD risk score (Arnett DK, et al., 2019) is: 4.8%    Assessment & Plan:  There are no diagnoses linked to this encounter.  No follow-ups on file.   Cruzita Lederer Newman Nip, FNP

## 2023-08-05 NOTE — Patient Instructions (Signed)

## 2023-08-06 ENCOUNTER — Encounter: Payer: Self-pay | Admitting: Family Medicine

## 2023-08-06 ENCOUNTER — Ambulatory Visit: Payer: Self-pay | Admitting: Family Medicine

## 2023-08-06 VITALS — BP 121/85 | HR 74 | Ht 62.0 in | Wt 167.0 lb

## 2023-08-06 DIAGNOSIS — B349 Viral infection, unspecified: Secondary | ICD-10-CM | POA: Diagnosis not present

## 2023-08-06 MED ORDER — PROMETHAZINE-DM 6.25-15 MG/5ML PO SYRP
5.0000 mL | ORAL_SOLUTION | Freq: Four times a day (QID) | ORAL | 0 refills | Status: DC | PRN
Start: 2023-08-06 — End: 2023-11-27

## 2023-08-06 MED ORDER — LEVOCETIRIZINE DIHYDROCHLORIDE 5 MG PO TABS: 5.0000 mg | ORAL_TABLET | Freq: Every evening | ORAL | 1 refills | Status: DC

## 2023-08-06 MED ORDER — ALBUTEROL SULFATE HFA 108 (90 BASE) MCG/ACT IN AERS: 2.0000 | INHALATION_SPRAY | Freq: Four times a day (QID) | RESPIRATORY_TRACT | 2 refills | Status: AC | PRN

## 2023-08-06 NOTE — Assessment & Plan Note (Addendum)
 COVID-19, Flu A+B RSV test ordered   Promethazine syrup PRN, Xyzal 5 mg at bedtime, albuterol PRN Advise patient to rest to support your body's recovery. Stay hydrated by drinking water, tea, or broth. Using a humidifier can help soothe throat irritation and ease nasal congestion. For fever or pain, acetaminophen (Tylenol) is recommended. To relieve other symptoms, try saline nasal sprays, throat lozenges, or gargling with saltwater. Focus on eating light, healthy meals like fruits and vegetables to keep your strength up. Practice good hygiene by washing your hands frequently and covering your mouth when coughing or sneezing.Follow-up for worsening or persistent symptoms. Patient verbalizes understanding regarding plan of care and all questions answered

## 2023-08-07 ENCOUNTER — Encounter: Payer: Self-pay | Admitting: Orthopaedic Surgery

## 2023-08-07 ENCOUNTER — Ambulatory Visit: Payer: Medicaid Other | Admitting: Orthopaedic Surgery

## 2023-08-07 VITALS — BP 125/74 | HR 79 | Ht 62.0 in | Wt 167.0 lb

## 2023-08-07 DIAGNOSIS — M25511 Pain in right shoulder: Secondary | ICD-10-CM

## 2023-08-07 DIAGNOSIS — G8929 Other chronic pain: Secondary | ICD-10-CM | POA: Diagnosis not present

## 2023-08-07 DIAGNOSIS — M25512 Pain in left shoulder: Secondary | ICD-10-CM | POA: Diagnosis not present

## 2023-08-07 NOTE — Progress Notes (Signed)
 My shoulders hurt.  I saw her last in January 2024.  She had some neck pain and right shoulder pain.  That got better but her shoulder pain has returned and the left shoulder is bother her now.  The neck pain has resolved.  She has no trauma, she has pain with overhead use, she has no numbness, no redness, no swelling.  ROM of both shoulders are full, pain in the extremes, no effusion.  Grips normal.  NV intact.  ROM of neck is full.  Encounter Diagnoses  Name Primary?   Chronic right shoulder pain Yes   Chronic left shoulder pain    PROCEDURE NOTE:  The patient request injection, verbal consent was obtained.  The right shoulder was prepped appropriately after time out was performed.   Sterile technique was observed and injection of 1 cc of DepoMedrol 40mg  with several cc's of plain xylocaine. Anesthesia was provided by ethyl chloride and a 20-gauge needle was used to inject the shoulder area. A posterior approach was used.  The injection was tolerated well.  A band aid dressing was applied.  The patient was advised to apply ice later today and tomorrow to the injection sight as needed.   PROCEDURE NOTE:  The patient request injection, verbal consent was obtained.  The left shoulder was prepped appropriately after time out was performed.   Sterile technique was observed and injection of 1 cc of DepoMedrol 40mg  with several cc's of plain xylocaine. Anesthesia was provided by ethyl chloride and a 20-gauge needle was used to inject the shoulder area. A posterior approach was used.  The injection was tolerated well.  A band aid dressing was applied.  The patient was advised to apply ice later today and tomorrow to the injection sight as needed.  Return in two weeks.  She may need MRI.  Call if any problem.  Precautions discussed.  Electronically Signed Darreld Mclean, MD 2/27/20259:38 AM

## 2023-08-08 ENCOUNTER — Telehealth: Payer: Self-pay

## 2023-08-08 ENCOUNTER — Encounter: Payer: Self-pay | Admitting: Family Medicine

## 2023-08-08 ENCOUNTER — Other Ambulatory Visit: Payer: Self-pay | Admitting: Family Medicine

## 2023-08-08 LAB — COVID-19, FLU A+B AND RSV
Influenza A, NAA: NOT DETECTED
Influenza B, NAA: NOT DETECTED
RSV, NAA: NOT DETECTED
SARS-CoV-2, NAA: DETECTED — AB

## 2023-08-08 MED ORDER — NIRMATRELVIR/RITONAVIR (PAXLOVID) TABLET (RENAL DOSING): 2.0000 | ORAL_TABLET | Freq: Two times a day (BID) | ORAL | 0 refills | Status: AC

## 2023-08-08 NOTE — Telephone Encounter (Signed)
 Copied from CRM [id number]. Topic: Clinical - Lab/Test Results >> Aug 08, 2023  9:06 AM Philippa Chester F wrote: Reason for CRM: Patient was relayed the results of her Covid test was positive and she wanted to ensure that she can return to work on the 5th if she starts taking the Paxlovid medication today 08/08/2023. Patient was informed to contact pharmacy as the note from NP stated they sent over the medication to her pharmacy. Just looking for further clarification on her results.

## 2023-08-09 DIAGNOSIS — Z419 Encounter for procedure for purposes other than remedying health state, unspecified: Secondary | ICD-10-CM | POA: Diagnosis not present

## 2023-08-14 ENCOUNTER — Ambulatory Visit: Payer: 59 | Admitting: Orthopedic Surgery

## 2023-08-14 DIAGNOSIS — Z9889 Other specified postprocedural states: Secondary | ICD-10-CM

## 2023-08-14 NOTE — Progress Notes (Signed)
   LMP 10/26/2016 (Exact Date)     Chief Complaint  Patient presents with   Routine Post Op    L KA DOS 12/25/22    Encounter Diagnosis  Name Primary?   S/P arthroscopy of left knee debridement of ACL/ lateral meniscectomy 12/25/22 Yes    This is a 63 year old female who had debridement of a partial ACL injury and lateral meniscectomy she took a long time to get better but is made significant progress and is now ambulatory with only a knee sleeve she complains of some crepitance but her pain is much improved she can walk without any cane or crutch  Her knee feels relatively stable good range of motion no effusion  She is at risk for further problems with the knee please refer to operative report for exact details of the pathology but for now  Return as needed   Improved

## 2023-08-14 NOTE — Progress Notes (Signed)
   LMP 10/26/2016 (Exact Date)   There is no height or weight on file to calculate BMI.  Chief Complaint  Patient presents with   Routine Post Op    L KA DOS 12/25/22    Encounter Diagnosis  Name Primary?   S/P arthroscopy of left knee debridement of ACL/ lateral meniscectomy 12/25/22 Yes    DOI/DOS/ Date: 12/25/22  Improved

## 2023-08-20 ENCOUNTER — Other Ambulatory Visit: Payer: Self-pay | Admitting: Family Medicine

## 2023-08-20 ENCOUNTER — Encounter: Payer: Self-pay | Admitting: Orthopaedic Surgery

## 2023-08-20 ENCOUNTER — Ambulatory Visit: Payer: Medicaid Other | Admitting: Orthopaedic Surgery

## 2023-08-20 VITALS — BP 125/74 | Ht 62.0 in | Wt 167.0 lb

## 2023-08-20 DIAGNOSIS — G8929 Other chronic pain: Secondary | ICD-10-CM | POA: Diagnosis not present

## 2023-08-20 DIAGNOSIS — M25512 Pain in left shoulder: Secondary | ICD-10-CM | POA: Diagnosis not present

## 2023-08-20 DIAGNOSIS — M25511 Pain in right shoulder: Secondary | ICD-10-CM

## 2023-08-20 NOTE — Progress Notes (Signed)
 I am better.  Dr. Romeo Apple did injections into the shoulders.  She is much improved.  She has some crepitus to the left shoulder.  ROM of the shoulders are full but pain in the extremes.  NV intact.  ROM of neck is full.  Encounter Diagnoses  Name Primary?   Chronic right shoulder pain Yes   Chronic left shoulder pain    Continue exercises at home.  See Dr. Romeo Apple in two weeks.  Call if any problem.  Precautions discussed.  Electronically Signed Darreld Mclean, MD 3/12/20251:54 PM

## 2023-08-25 DIAGNOSIS — M533 Sacrococcygeal disorders, not elsewhere classified: Secondary | ICD-10-CM | POA: Diagnosis not present

## 2023-08-26 ENCOUNTER — Ambulatory Visit: Payer: Medicaid Other | Admitting: Gastroenterology

## 2023-08-26 VITALS — BP 113/73 | HR 96 | Temp 97.6°F | Ht 62.0 in | Wt 173.0 lb

## 2023-08-26 DIAGNOSIS — K219 Gastro-esophageal reflux disease without esophagitis: Secondary | ICD-10-CM

## 2023-08-26 DIAGNOSIS — K649 Unspecified hemorrhoids: Secondary | ICD-10-CM | POA: Diagnosis not present

## 2023-08-26 DIAGNOSIS — Z9049 Acquired absence of other specified parts of digestive tract: Secondary | ICD-10-CM

## 2023-08-26 DIAGNOSIS — R7989 Other specified abnormal findings of blood chemistry: Secondary | ICD-10-CM | POA: Diagnosis not present

## 2023-08-26 MED ORDER — HYDROCORTISONE (PERIANAL) 2.5 % EX CREA: 1.0000 | TOPICAL_CREAM | Freq: Two times a day (BID) | CUTANEOUS | 1 refills | Status: AC

## 2023-08-26 NOTE — Patient Instructions (Signed)
 Start taking Benefiber 2 teaspoons each morning in beverage of your choice.  I have sent in a rectal cream for hemorrhoids to use twice a day for hemorrhoid discomfort.   You can decrease pantoprazole to once daily.   Please have blood work done for your liver.   We are also arranging an ultrasound of your liver.   Further recommendations to follow!   I enjoyed seeing you again today! I value our relationship and want to provide genuine, compassionate, and quality care. You may receive a survey regarding your visit with me, and I welcome your feedback! Thanks so much for taking the time to complete this. I look forward to seeing you again.      Gelene Mink, PhD, ANP-BC [facility]

## 2023-08-26 NOTE — Progress Notes (Signed)
 Gastroenterology Office Note     Primary Care Physician:  Gilmore Laroche, FNP  Primary Gastroenterologist: Dr. Jena Gauss    Chief Complaint   Chief Complaint  Patient presents with   Follow-up    Follow up. Still has some gas      History of Present Illness   Jacqueline [name] is a 63 y.o. female presenting today with a history of GERD, dysphagia, LPR, H.pylori gastritis s/p treatment X2 and with persistent evidence of infection on recent EGD in June 2024, finally with documented eradication after pantoprazole BID, levofloxacin daily X 14 days, and amoxicillin 750 mg TID for 14 days following EGD June 2024   Now with new onset LFTs starting in September 2024. ALK phos 147, AST 130, ALT 221. Transaminases increasing. Feb 2025 Alk Phos 241, AST 421, ALT 461. Platelets normal. Was having upper abdominal pain persistently, daily, no N/V. Chronic GERD. Abdominal pain improved. Pain is not every day. Has been awhile. No ETOH. No drug use. No recent tattoos. No changes in medications. No herbal supplements. Taking pantoprazole BID twice a day.   BM every day to every few days. Hemorrhoids flaring. Declining rectal  EGD November 18, 2022: mild Schatzki ring s/p dilation, small hiatal hernia, normal duodenum, gastric biopsies positive for H.pylori.     Colonoscopy up-to-date, due in 2026.   Past Medical History:  Diagnosis Date   Anemia    Arthritis    per patient, back   Complication of anesthesia 2018   per patient HR dropped during partial hysterectomy surgery   Fibroid uterus    GERD (gastroesophageal reflux disease)    H/O total hysterectomy    Heart murmur    Hyperlipidemia    Borderline   Hypertension    Numbness and tingling in right hand    Pre-diabetes    borderline   Sleep apnea     Past Surgical History:  Procedure Laterality Date   BACK SURGERY     BALLOON DILATION N/A 05/22/2021   Procedure: BALLOON DILATION;  Surgeon: Lanelle Bal, DO;  [facility];  Service: Endoscopy;  Laterality: N/A;   BIOPSY  11/18/2022   Procedure: BIOPSY;  Surgeon: Corbin Ade, MD;  [facility];  Service: Endoscopy;;   CATARACT EXTRACTION W/PHACO Left 06/27/2023   Procedure: CATARACT EXTRACTION PHACO AND INTRAOCULAR LENS PLACEMENT (IOC);  Surgeon: [name], MD;  Location: [facility];  Service: Ophthalmology;  Laterality: Left;  CDE: 3.56   CATARACT EXTRACTION W/PHACO Right 07/11/2023   Procedure: CATARACT EXTRACTION PHACO AND INTRAOCULAR LENS PLACEMENT (IOC);  Surgeon: [name], MD;  Location: [facility];  Service: Ophthalmology;  Laterality: Right;  CDE: 2.82   CHOLECYSTECTOMY     COLONOSCOPY  2003   Dr. Jena Gauss: Anal canal hemorrhoids   COLONOSCOPY N/A 08/25/2014   Normal colonoscopy.  Next colonoscopy in 2026.  Procedure: COLONOSCOPY;  Surgeon: Corbin Ade, MD;  [facility];  Service: Endoscopy;  Laterality: N/A;  1200   ESOPHAGOGASTRODUODENOSCOPY N/A 08/25/2014   Procedure: ESOPHAGOGASTRODUODENOSCOPY (EGD);  Surgeon: Corbin Ade, MD;  [facility];  Service: Endoscopy;  Laterality: N/A;   ESOPHAGOGASTRODUODENOSCOPY (EGD) WITH PROPOFOL N/A 05/22/2021   Web and distal esophagus, dilated, gastriti, biopsy positive for mildly active H. pylori gastritis.  s procedure: ESOPHAGOGASTRODUODENOSCOPY (EGD) WITH PROPOFOL;  Surgeon: Lanelle Bal, DO;  [facility];  Service: Endoscopy;  Laterality: N/A;  9:15am   ESOPHAGOGASTRODUODENOSCOPY (EGD) WITH PROPOFOL N/A  11/18/2022   Procedure: ESOPHAGOGASTRODUODENOSCOPY (EGD) WITH PROPOFOL;  Surgeon: Corbin Ade, MD;  [facility];  Service: Endoscopy;  Laterality: N/A;  2:15 pm, asa 2   KNEE ARTHROSCOPY WITH MEDIAL MENISECTOMY Left 12/25/2022   Procedure: KNEE ARTHROSCOPY WITH PARTIAL LATERAL MENISCECTOMY DEBRIDEMENT ACL;  Surgeon: Vickki Hearing, MD;  [facility];  Service: Orthopedics;  Laterality: Left;   MALONEY DILATION N/A 11/18/2022    Procedure: Elease Hashimoto DILATION;  Surgeon: Corbin Ade, MD;  [facility];  Service: Endoscopy;  Laterality: N/A;   SALPINGOOPHORECTOMY Bilateral 11/06/2016   Procedure: BILATERAL SALPINGO OOPHORECTOMY;  Surgeon: Lazaro Arms, MD;  [facility];  Service: Gynecology;  Laterality: Bilateral;   SUPRACERVICAL ABDOMINAL HYSTERECTOMY N/A 11/06/2016   Procedure: HYSTERECTOMY SUPRACERVICAL ABDOMINAL;  Surgeon: Lazaro Arms, MD;  [facility];  Service: Gynecology;  Laterality: N/A;   TRANSFORAMINAL LUMBAR INTERBODY FUSION (TLIF) WITH PEDICLE SCREW FIXATION 2 LEVEL Left 07/28/2019   Procedure: LEFT-SIDED LUMBAR 4- 5, LUMBAR 5 -SACRUM1 TRANSFORAMINAL LUMBAR INTERBODY FUSION WITH INSTRUMENTATION AND ALLOGRAFT;  Surgeon: Estill Bamberg, MD;  Location[facility];  Service: Orthopedics;  Laterality: Left;   TUBAL LIGATION      Current Outpatient Medications  Medication Sig Dispense Refill   acyclovir (ZOVIRAX) 400 MG tablet TAKE 1 TABLET (400 MG TOTAL) BY MOUTH DAILY AS NEEDED (OUTBREAKS) 30 tablet 5   albuterol (VENTOLIN HFA) 108 (90 Base) MCG/ACT inhaler Inhale 2 puffs into the lungs every 6 (six) hours as needed for wheezing or shortness of breath. 8 g 2   amLODipine (NORVASC) 5 MG tablet Take 1 tablet (5 mg total) by mouth daily. 90 tablet 1   azelastine (ASTELIN) 0.1 % nasal spray Place 1 spray into both nostrils 2 (two) times daily as needed for rhinitis. Use in each nostril as directed     diclofenac (VOLTAREN) 75 MG EC tablet TAKE 1 TABLET BY MOUTH TWICE DAILY WITH A MEAL 60 tablet 5   diclofenac Sodium (VOLTAREN) 1 % GEL Apply 4 g topically 4 (four) times daily. To knee 500 g 1   DULoxetine (CYMBALTA) 30 MG capsule Take 1 capsule by mouth once daily 90 capsule 0   estradiol (ESTRACE) 0.1 MG/GM vaginal cream INSERT 2 GRAMS VAGINALLY AS DIRECTED EVERY OTHER NIGHT 43 g 5   ezetimibe (ZETIA) 10 MG tablet Take 1 tablet (10 mg total) by mouth daily. 90 tablet 3   ibuprofen (ADVIL) 800  MG tablet Take 1 tablet (800 mg total) by mouth every 8 (eight) hours as needed. 90 tablet 1   levocetirizine (XYZAL) 5 MG tablet Take 1 tablet (5 mg total) by mouth every evening. 30 tablet 1   meloxicam (MOBIC) 7.5 MG tablet Take 7.5 mg by mouth daily.     methocarbamol (ROBAXIN) 750 MG tablet Take 1 tablet by mouth 4 times daily 30 tablet 0   Multiple Vitamin (MULTIVITAMIN WITH MINERALS) TABS tablet Take 1 tablet by mouth daily with supper. One-A-Day Multivitamin     pantoprazole (PROTONIX) 40 MG tablet TAKE 1 TABLET BY MOUTH TWICE DAILY BEFORE A MEAL. FOR 14 DAYS WHILE TAKING ANTIBIOTICS FOR H.PYLORI, THEN RESUME TO ONCE DAILY 60 tablet 0   promethazine-dextromethorphan (PROMETHAZINE-DM) 6.25-15 MG/5ML syrup Take 5 mLs by mouth 4 (four) times daily as needed. 118 mL 0   traZODone (DESYREL) 50 MG tablet TAKE 1/2 TO 1 (ONE-HALF TO ONE) TABLET BY MOUTH AT BEDTIME AS NEEDED FOR SLEEP 30 tablet 0   fluticasone (FLONASE) 50  MCG/ACT nasal spray Place 1 spray into both nostrils daily. (Patient not taking: Reported on 08/26/2023) 16 g 6   pantoprazole (PROTONIX) 40 MG tablet Take 1 tablet (40 mg total) by mouth 2 (two) times daily before a meal. (Patient not taking: Reported on 08/26/2023) 180 tablet 0   No current facility-administered medications for this visit.    Allergies as of 08/26/2023   (No Known Allergies)    Family History  Problem Relation Age of Onset   Breast cancer Maternal Aunt    Hypertension Maternal Aunt    Cancer Maternal Aunt        Breast   Cancer Maternal Grandmother         lymphnodes   Colon cancer Neg Hx     Social History   Socioeconomic History   Marital status: Married    Spouse name: Not on file   Number of children: 3   Years of education: Not on file   Highest education level: Not on file  Occupational History   Occupation: PT at [employer]  Tobacco Use   Smoking status: Never   Smokeless tobacco: Never   Tobacco comments:    Never smoked  Vaping  Use   Vaping status: Never Used  Substance and Sexual Activity   Alcohol use: No    Alcohol/week: 0.0 standard drinks of alcohol   Drug use: No   Sexual activity: Yes    Birth control/protection: Post-menopausal, Surgical  Other Topics Concern   Not on file  Social History Narrative   Lives with her spouse, work part at [employer]    Social Drivers of Health   Financial Resource Strain: Not on file  Food Insecurity: Not on file  Transportation Needs: Not on file  Physical Activity: Not on file  Stress: Not on file  Social Connections: Not on file  Intimate Partner Violence: Not on file     Review of Systems   Gen: Denies any fever, chills, fatigue, weight loss, lack of appetite.  CV: Denies chest pain, heart palpitations, peripheral edema, syncope.  Resp: Denies shortness of breath at rest or with exertion. Denies wheezing or cough.  GI: Denies dysphagia or odynophagia. Denies jaundice, hematemesis, fecal incontinence. GU : Denies urinary burning, urinary frequency, urinary hesitancy MS: Denies joint pain, muscle weakness, cramps, or limitation of movement.  Derm: Denies rash, itching, dry skin Psych: Denies depression, anxiety, memory loss, and confusion Heme: Denies bruising, bleeding, and enlarged lymph nodes.   Physical Exam   BP 113/73 (BP Location: Right Arm, Patient Position: Sitting, Cuff Size: Normal)   Pulse 96   Temp 97.6 F (36.4 C) (Temporal)   Ht 5\' 2"  (1.575 m)   Wt 173 lb (78.5 kg)   LMP 10/26/2016 (Exact Date)   BMI 31.64 kg/m  General:   Alert and oriented. Pleasant and cooperative. Well-nourished and well-developed.  Head:  Normocephalic and atraumatic. Eyes:  Without icterus Abdomen:  +BS, soft, non-tender and non-distended. No HSM noted. No guarding or rebound. No masses appreciated.  Rectal:  Deferred  Msk:  Symmetrical without gross deformities. Normal posture. Extremities:  Without edema. Neurologic:  Alert and  oriented x4;  grossly  normal neurologically. Skin:  Intact without significant lesions or rashes. Psych:  Alert and cooperative. Normal mood and affect.   Assessment   Jacqueline [name] is a 63 y.o. female presenting today with a history of GERD, dysphagia, LPR, H.pylori gastritis s/p treatment X3 and finally documented eradication, presenting now with  newly onset LFTs starting in Sept 2024.   Acutely elevated LFTS: September 2024. ALK phos 147, AST 130, ALT 221. Transaminases increasing. Feb 2025 Alk Phos 241, AST 421, ALT 461. Platelets normal. Interestingly, she was having upper abdominal pain persistently at this time, but she has had much improvement since onset in September, noting sparingly. Denies any risk factors for viral hepatitis, DILI, ETOH induced, herbal supplements. Gallbladder absent. Unable to exclude microlithiasis. Needs full serologies and imaging.   Hemorrhoids: declining rectal exam. Will send in anusol. Colonoscopy up-to-date and due in 2026.   GERD: on BID PPI   PLAN    Taking PPI BID: let's decrease to once daily.  Full serologies due to elevated LFTs US abdomen in near future Anusol sent to pharmacy Further recommendations to follow    Gelene Mink, PhD, ANP-BC [facility]

## 2023-08-29 DIAGNOSIS — R7989 Other specified abnormal findings of blood chemistry: Secondary | ICD-10-CM | POA: Diagnosis not present

## 2023-09-03 ENCOUNTER — Other Ambulatory Visit (INDEPENDENT_AMBULATORY_CARE_PROVIDER_SITE_OTHER)

## 2023-09-03 ENCOUNTER — Encounter: Payer: Self-pay | Admitting: Orthopaedic Surgery

## 2023-09-03 ENCOUNTER — Ambulatory Visit: Admitting: Orthopaedic Surgery

## 2023-09-03 VITALS — BP 113/73 | Ht 62.0 in | Wt 173.0 lb

## 2023-09-03 DIAGNOSIS — G8929 Other chronic pain: Secondary | ICD-10-CM | POA: Diagnosis not present

## 2023-09-03 DIAGNOSIS — M542 Cervicalgia: Secondary | ICD-10-CM | POA: Diagnosis not present

## 2023-09-03 NOTE — Progress Notes (Signed)
 My neck hurts.  She has been having pain in the cervical spine area for several weeks.  She has no trauma.  She has pain going to the left and right shoulders at times, some pain to the left upper arm, pain in the left trapezius.  She has been seen by Dr. Romeo Apple and had injections in the shoulders in the past.  He had mentioned possibility of MRI of neck if not improved.  ROM of the neck is good but painful to the left.  Left upper trapezius tender but no spasm.  NV intact, ROM of shoulders is good, grips normal.  X-rays were done of the cervical spine, reported separately.  Encounter Diagnosis  Name Primary?   Neck pain, chronic Yes   I will get MRI of the cervical spine.  Continue her diclofenac.  Return in three weeks.  Call if any problem.  Precautions discussed.  Electronically Signed Darreld Mclean, MD 3/26/20253:15 PM

## 2023-09-04 ENCOUNTER — Encounter: Payer: Self-pay | Admitting: *Deleted

## 2023-09-05 LAB — HEPATIC FUNCTION PANEL
ALT: 121 IU/L — ABNORMAL HIGH (ref 0–32)
AST: 93 IU/L — ABNORMAL HIGH (ref 0–40)
Albumin: 4.3 g/dL (ref 3.9–4.9)
Alkaline Phosphatase: 129 IU/L — ABNORMAL HIGH (ref 44–121)
Bilirubin Total: 0.8 mg/dL (ref 0.0–1.2)
Bilirubin, Direct: 0.29 mg/dL (ref 0.00–0.40)
Total Protein: 7 g/dL (ref 6.0–8.5)

## 2023-09-05 LAB — HEPATITIS A ANTIBODY, TOTAL: hep A Total Ab: POSITIVE — AB

## 2023-09-05 LAB — PROTIME-INR
INR: 1 (ref 0.9–1.2)
Prothrombin Time: 11.5 s (ref 9.1–12.0)

## 2023-09-05 LAB — IRON,TIBC AND FERRITIN PANEL
Ferritin: 367 ng/mL — ABNORMAL HIGH (ref 15–150)
Iron Saturation: 48 % (ref 15–55)
Iron: 134 ug/dL (ref 27–139)
Total Iron Binding Capacity: 277 ug/dL (ref 250–450)
UIBC: 143 ug/dL (ref 118–369)

## 2023-09-05 LAB — ANTI-SMOOTH MUSCLE ANTIBODY, IGG: Smooth Muscle Ab: 20 U — ABNORMAL HIGH (ref 0–19)

## 2023-09-05 LAB — MITOCHONDRIAL ANTIBODIES

## 2023-09-05 LAB — IGG, IGA, IGM
IgA/Immunoglobulin A, Serum: 264 mg/dL (ref 87–352)
IgG (Immunoglobin G), Serum: 1607 mg/dL — ABNORMAL HIGH (ref 586–1602)
IgM (Immunoglobulin M), Srm: 140 mg/dL (ref 26–217)

## 2023-09-05 LAB — ALPHA-1-ANTITRYPSIN DEFICIENCY

## 2023-09-05 LAB — HCV AB W REFLEX TO QUANT PCR: HCV Ab: NONREACTIVE

## 2023-09-05 LAB — HCV INTERPRETATION

## 2023-09-05 LAB — ANA

## 2023-09-05 LAB — CERULOPLASMIN: Ceruloplasmin: 24.2 mg/dL (ref 19.0–39.0)

## 2023-09-05 LAB — HEPATITIS B SURFACE ANTIGEN: Hepatitis B Surface Ag: NEGATIVE

## 2023-09-09 DIAGNOSIS — M533 Sacrococcygeal disorders, not elsewhere classified: Secondary | ICD-10-CM | POA: Diagnosis not present

## 2023-09-16 ENCOUNTER — Ambulatory Visit (HOSPITAL_COMMUNITY)

## 2023-09-17 ENCOUNTER — Ambulatory Visit: Admitting: Orthopaedic Surgery

## 2023-09-18 ENCOUNTER — Encounter (HOSPITAL_COMMUNITY): Payer: Self-pay

## 2023-09-18 ENCOUNTER — Ambulatory Visit (HOSPITAL_COMMUNITY)

## 2023-09-20 DIAGNOSIS — Z419 Encounter for procedure for purposes other than remedying health state, unspecified: Secondary | ICD-10-CM | POA: Diagnosis not present

## 2023-09-24 ENCOUNTER — Ambulatory Visit: Admitting: Orthopaedic Surgery

## 2023-09-24 ENCOUNTER — Encounter: Payer: Self-pay | Admitting: Orthopaedic Surgery

## 2023-09-24 DIAGNOSIS — M542 Cervicalgia: Secondary | ICD-10-CM | POA: Diagnosis not present

## 2023-09-24 DIAGNOSIS — G8929 Other chronic pain: Secondary | ICD-10-CM | POA: Diagnosis not present

## 2023-09-24 NOTE — Progress Notes (Signed)
 I need the MRI.  She has chronic neck pain. We are waiting for insurance approval for the MRI.  She has no new trauma.  She has neck pain that is getting worse.  She has been taking her medicine.  ROM of the neck is painful but full.  NV is intact but she has more left upper trapezius pain, no spasm.  Grips are good.  Encounter Diagnosis  Name Primary?   Neck pain, chronic Yes   I await MRI.  Continue same medicine.  Return in three weeks.  Call if any problem.  Precautions discussed.  Electronically Signed Pleasant Brilliant, MD 4/16/20252:57 PM

## 2023-09-26 ENCOUNTER — Other Ambulatory Visit: Payer: Self-pay | Admitting: Family Medicine

## 2023-10-01 ENCOUNTER — Other Ambulatory Visit: Payer: Self-pay | Admitting: Family Medicine

## 2023-10-01 ENCOUNTER — Other Ambulatory Visit: Payer: Self-pay | Admitting: Gastroenterology

## 2023-10-01 ENCOUNTER — Ambulatory Visit (HOSPITAL_COMMUNITY)
Admission: RE | Admit: 2023-10-01 | Discharge: 2023-10-01 | Disposition: A | Discharge status: Home / Self Care | Source: Ambulatory Visit | Attending: Gastroenterology | Admitting: Gastroenterology

## 2023-10-01 DIAGNOSIS — M5489 Other dorsalgia: Secondary | ICD-10-CM

## 2023-10-01 DIAGNOSIS — Z9049 Acquired absence of other specified parts of digestive tract: Secondary | ICD-10-CM | POA: Diagnosis not present

## 2023-10-01 DIAGNOSIS — R7989 Other specified abnormal findings of blood chemistry: Secondary | ICD-10-CM | POA: Diagnosis not present

## 2023-10-07 ENCOUNTER — Ambulatory Visit: Admitting: Obstetrics & Gynecology

## 2023-10-07 ENCOUNTER — Encounter: Payer: Self-pay | Admitting: Obstetrics & Gynecology

## 2023-10-07 ENCOUNTER — Other Ambulatory Visit (HOSPITAL_COMMUNITY)
Admission: RE | Admit: 2023-10-07 | Discharge: 2023-10-07 | Disposition: A | Discharge status: Home / Self Care | Source: Ambulatory Visit | Attending: Obstetrics & Gynecology | Admitting: Obstetrics & Gynecology

## 2023-10-07 VITALS — BP 124/84 | HR 86 | Ht 62.5 in | Wt 182.0 lb

## 2023-10-07 DIAGNOSIS — N952 Postmenopausal atrophic vaginitis: Secondary | ICD-10-CM | POA: Diagnosis not present

## 2023-10-07 DIAGNOSIS — Z01419 Encounter for gynecological examination (general) (routine) without abnormal findings: Secondary | ICD-10-CM | POA: Insufficient documentation

## 2023-10-07 MED ORDER — PREMARIN 0.625 MG/GM VA CREA: 1.0000 | TOPICAL_CREAM | Freq: Every day | VAGINAL | 12 refills | Status: AC

## 2023-10-07 NOTE — Progress Notes (Signed)
 Subjective:     Jacqueline [name] is a 63 y.o. female here for a routine exam.  Patient's last menstrual period was 10/26/2016 (exact date). G3P3 Birth Control Method:  hysterectomy/menopausal Menstrual Calendar(currently): amenorrhea  Current complaints: back issues.   Current acute medical issues:  back   Recent Gynecologic History Patient's last menstrual period was 10/26/2016 (exact date). Last Pap: 2018,  normal Last mammogram: 11/2022,  normal  Past Medical History:  Diagnosis Date   Anemia    Arthritis    per patient, back   Complication of anesthesia 2018   per patient HR dropped during partial hysterectomy surgery   Fibroid uterus    GERD (gastroesophageal reflux disease)    H/O total hysterectomy    Heart murmur    Hyperlipidemia    Borderline   Hypertension    Numbness and tingling in right hand    Pre-diabetes    borderline   Sleep apnea     Past Surgical History:  Procedure Laterality Date   BACK SURGERY     BALLOON DILATION N/A 05/22/2021   Procedure: BALLOON DILATION;  Surgeon: Vinetta Greening, DO;  [facility];  Service: Endoscopy;  Laterality: N/A;   BIOPSY  11/18/2022   Procedure: BIOPSY;  Surgeon: Suzette Espy, MD;  [facility];  Service: Endoscopy;;   CATARACT EXTRACTION W/PHACO Left 06/27/2023   Procedure: CATARACT EXTRACTION PHACO AND INTRAOCULAR LENS PLACEMENT (IOC);  Surgeon: Tarri Farm, MD;  [facility];  Service: Ophthalmology;  Laterality: Left;  CDE: 3.56   CATARACT EXTRACTION W/PHACO Right 07/11/2023   Procedure: CATARACT EXTRACTION PHACO AND INTRAOCULAR LENS PLACEMENT (IOC);  Surgeon: Tarri Farm, MD;  [facility];  Service: Ophthalmology;  Laterality: Right;  CDE: 2.82   CHOLECYSTECTOMY     COLONOSCOPY  2003   Dr. Riley Cheadle: Anal canal hemorrhoids   COLONOSCOPY N/A 08/25/2014   Normal colonoscopy.  Next colonoscopy in 2026.  Procedure: COLONOSCOPY;  Surgeon: Suzette Espy, MD;  [facility];   Service: Endoscopy;  Laterality: N/A;  1200   ESOPHAGOGASTRODUODENOSCOPY N/A 08/25/2014   Procedure: ESOPHAGOGASTRODUODENOSCOPY (EGD);  Surgeon: Suzette Espy, MD;  [facility];  Service: Endoscopy;  Laterality: N/A;   ESOPHAGOGASTRODUODENOSCOPY (EGD) WITH PROPOFOL  N/A 05/22/2021   Web and distal esophagus, dilated, gastriti, biopsy positive for mildly active H. pylori gastritis.  s procedure: ESOPHAGOGASTRODUODENOSCOPY (EGD) WITH PROPOFOL ;  Surgeon: Vinetta Greening, DO;  [facility];  Service: Endoscopy;  Laterality: N/A;  9:15am   ESOPHAGOGASTRODUODENOSCOPY (EGD) WITH PROPOFOL  N/A 11/18/2022   Procedure: ESOPHAGOGASTRODUODENOSCOPY (EGD) WITH PROPOFOL ;  Surgeon: Suzette Espy, MD;  [facility];  Service: Endoscopy;  Laterality: N/A;  2:15 pm, asa 2   KNEE ARTHROSCOPY WITH MEDIAL MENISECTOMY Left 12/25/2022   Procedure: KNEE ARTHROSCOPY WITH PARTIAL LATERAL MENISCECTOMY DEBRIDEMENT ACL;  Surgeon: Darrin Emerald, MD;  [facility];  Service: Orthopedics;  Laterality: Left;   MALONEY DILATION N/A 11/18/2022   Procedure: Londa Rival DILATION;  Surgeon: Suzette Espy, MD;  [facility];  Service: Endoscopy;  Laterality: N/A;   SALPINGOOPHORECTOMY Bilateral 11/06/2016   Procedure: BILATERAL SALPINGO OOPHORECTOMY;  Surgeon: Wendelyn Halter, MD;  [facility];  Service: Gynecology;  Laterality: Bilateral;   SUPRACERVICAL ABDOMINAL HYSTERECTOMY N/A 11/06/2016   Procedure: HYSTERECTOMY SUPRACERVICAL ABDOMINAL;  Surgeon: Wendelyn Halter, MD;  [facility]: [facility];  Service: Gynecology;  Laterality: N/A;   TRANSFORAMINAL LUMBAR INTERBODY FUSION (TLIF) WITH PEDICLE SCREW  FIXATION 2 LEVEL Left 07/28/2019   Procedure: LEFT-SIDED LUMBAR 4- 5, LUMBAR 5 -SACRUM1 TRANSFORAMINAL LUMBAR INTERBODY FUSION WITH INSTRUMENTATION AND ALLOGRAFT;  Surgeon: Virl Grimes, MD;  Location[facility];  Service: Orthopedics;  Laterality: Left;   TUBAL LIGATION      OB History      Gravida  3   Para  3   Term      Preterm      AB      Living  3      SAB      IAB      Ectopic      Multiple      Live Births              Social History   Socioeconomic History   Marital status: Married    Spouse name: Not on file   Number of children: 3   Years of education: Not on file   Highest education level: Not on file  Occupational History   Occupation: PT at [employer]  Tobacco Use   Smoking status: Never   Smokeless tobacco: Never   Tobacco comments:    Never smoked  Vaping Use   Vaping status: Never Used  Substance and Sexual Activity   Alcohol use: No    Alcohol/week: 0.0 standard drinks of alcohol   Drug use: No   Sexual activity: Yes    Birth control/protection: Post-menopausal, Surgical  Other Topics Concern   Not on file  Social History Narrative   Lives with her spouse, work part at [employer]    Social Drivers of Health   Financial Resource Strain: Not on file  Food Insecurity: Not on file  Transportation Needs: Not on file  Physical Activity: Not on file  Stress: Not on file  Social Connections: Not on file    Family History  Problem Relation Age of Onset   Breast cancer Maternal Aunt    Hypertension Maternal Aunt    Cancer Maternal Aunt        Breast   Cancer Maternal Grandmother         lymphnodes   Colon cancer Neg Hx      Current Outpatient Medications:    acyclovir  (ZOVIRAX ) 400 MG tablet, TAKE 1 TABLET (400 MG TOTAL) BY MOUTH DAILY AS NEEDED (OUTBREAKS), Disp: 30 tablet, Rfl: 5   albuterol  (VENTOLIN  HFA) 108 (90 Base) MCG/ACT inhaler, Inhale 2 puffs into the lungs every 6 (six) hours as needed for wheezing or shortness of breath., Disp: 8 g, Rfl: 2   amLODipine  (NORVASC ) 5 MG tablet, Take 1 tablet (5 mg total) by mouth daily., Disp: 90 tablet, Rfl: 1   azelastine  (ASTELIN ) 0.1 % nasal spray, Place 1 spray into both nostrils 2 (two) times daily as needed for rhinitis. Use in each nostril as directed, Disp:  , Rfl:    conjugated estrogens  (PREMARIN ) vaginal cream, Place 1 Applicatorful vaginally daily. Use 1 gram nightly, Disp: 30 g, Rfl: 12   diclofenac  (VOLTAREN ) 75 MG EC tablet, TAKE 1 TABLET BY MOUTH TWICE DAILY WITH A MEAL, Disp: 60 tablet, Rfl: 5   diclofenac  Sodium (VOLTAREN ) 1 % GEL, Apply 4 g topically 4 (four) times daily. To knee, Disp: 500 g, Rfl: 1   DULoxetine  (CYMBALTA ) 30 MG capsule, Take 1 capsule by mouth once daily, Disp: 90 capsule, Rfl: 0   estradiol  (ESTRACE ) 0.1 MG/GM vaginal cream, INSERT 2 GRAMS VAGINALLY AS DIRECTED EVERY OTHER NIGHT, Disp: 43 g, Rfl: 5  ezetimibe  (ZETIA ) 10 MG tablet, Take 1 tablet (10 mg total) by mouth daily., Disp: 90 tablet, Rfl: 3   hydrocortisone  (ANUSOL -HC) 2.5 % rectal cream, Place 1 Application rectally 2 (two) times daily. For hemorrhoids, Disp: 30 g, Rfl: 1   ibuprofen  (ADVIL ) 800 MG tablet, Take 1 tablet (800 mg total) by mouth every 8 (eight) hours as needed., Disp: 90 tablet, Rfl: 1   levocetirizine (XYZAL ) 5 MG tablet, TAKE 1 TABLET BY MOUTH ONCE DAILY IN THE EVENING, Disp: 30 tablet, Rfl: 0   meloxicam  (MOBIC ) 7.5 MG tablet, Take 7.5 mg by mouth daily., Disp: , Rfl:    methocarbamol  (ROBAXIN ) 750 MG tablet, Take 1 tablet by mouth 4 times daily, Disp: 30 tablet, Rfl: 0   Multiple Vitamin (MULTIVITAMIN WITH MINERALS) TABS tablet, Take 1 tablet by mouth daily with supper. One-A-Day Multivitamin, Disp: , Rfl:    pantoprazole  (PROTONIX ) 40 MG tablet, TAKE 1 TABLET BY MOUTH TWICE DAILY BEFORE A MEAL, Disp: 60 tablet, Rfl: 0   promethazine -dextromethorphan (PROMETHAZINE -DM) 6.25-15 MG/5ML syrup, Take 5 mLs by mouth 4 (four) times daily as needed., Disp: 118 mL, Rfl: 0   traZODone  (DESYREL ) 50 MG tablet, TAKE 1/2 TO 1 (ONE-HALF TO ONE) TABLET BY MOUTH AT BEDTIME AS NEEDED FOR SLEEP, Disp: 30 tablet, Rfl: 0  Review of Systems  Review of Systems  Constitutional: Negative for fever, chills, weight loss, malaise/fatigue and diaphoresis.  HENT:  Negative for hearing loss, ear pain, nosebleeds, congestion, sore throat, neck pain, tinnitus and ear discharge.   Eyes: Negative for blurred vision, double vision, photophobia, pain, discharge and redness.  Respiratory: Negative for cough, hemoptysis, sputum production, shortness of breath, wheezing and stridor.   Cardiovascular: Negative for chest pain, palpitations, orthopnea, claudication, leg swelling and PND.  Gastrointestinal: negative for abdominal pain. Negative for heartburn, nausea, vomiting, diarrhea, constipation, blood in stool and melena.  Genitourinary: Negative for dysuria, urgency, frequency, hematuria and flank pain.  Musculoskeletal: Negative for myalgias, back pain, joint pain and falls.  Skin: Negative for itching and rash.  Neurological: Negative for dizziness, tingling, tremors, sensory change, speech change, focal weakness, seizures, loss of consciousness, weakness and headaches.  Endo/Heme/Allergies: Negative for environmental allergies and polydipsia. Does not bruise/bleed easily.  Psychiatric/Behavioral: Negative for depression, suicidal ideas, hallucinations, memory loss and substance abuse. The patient is not nervous/anxious and does not have insomnia.        Objective:  Blood pressure 124/84, pulse 86, height 5' 2.5" (1.588 m), weight 182 lb (82.6 kg), last menstrual period 10/26/2016.   Physical Exam  Vitals reviewed. Constitutional: She is oriented to person, place, and time. She appears well-developed and well-nourished.  HENT:  Head: Normocephalic and atraumatic.        Right Ear: External ear normal.  Left Ear: External ear normal.  Nose: Nose normal.  Mouth/Throat: Oropharynx is clear and moist.  Eyes: Conjunctivae and EOM are normal. Pupils are equal, round, and reactive to light. Right eye exhibits no discharge. Left eye exhibits no discharge. No scleral icterus.  Neck: Normal range of motion. Neck supple. No tracheal deviation present. No thyromegaly  present.  Cardiovascular: Normal rate, regular rhythm, normal heart sounds and intact distal pulses.  Exam reveals no gallop and no friction rub.   No murmur heard. Respiratory: Effort normal and breath sounds normal. No respiratory distress. She has no wheezes. She has no rales. She exhibits no tenderness.  GI: Soft. Bowel sounds are normal. She exhibits no distension and no mass. There is no  tenderness. There is no rebound and no guarding.  Genitourinary:  Breasts no masses skin changes or nipple changes bilaterally      Vulva is normal without lesions Vagina is atrophic no lesions Cervix normal in appearance and pap is done Uterus is absent Adnexa is negative with normal sized ovaries   Musculoskeletal: Normal range of motion. She exhibits no edema and no tenderness.  Neurological: She is alert and oriented to person, place, and time. She has normal reflexes. She displays normal reflexes. No cranial nerve deficit. She exhibits normal muscle tone. Coordination normal.  Skin: Skin is warm and dry. No rash noted. No erythema. No pallor.  Psychiatric: She has a normal mood and affect. Her behavior is normal. Judgment and thought content normal.       Medications Ordered at today's visit: Meds ordered this encounter  Medications   conjugated estrogens  (PREMARIN ) vaginal cream    Sig: Place 1 Applicatorful vaginally daily. Use 1 gram nightly    Dispense:  30 g    Refill:  12    Other orders placed at today's visit: No orders of the defined types were placed in this encounter.    ASSESSMENT + PLAN:    ICD-10-CM   1. Well woman exam with routine gynecological exam  Z01.419     2. Encounter for gynecological examination with Papanicolaou smear of cervix  Z01.419 Cytology - PAP( [place])    3. Atrophic vaginitis: Rx premarin  cream  N95.2           Return in about 3 years (around 10/07/2026), or if symptoms worsen or fail to improve.

## 2023-10-09 LAB — CYTOLOGY - PAP
Adequacy: ABSENT
Comment: NEGATIVE
Diagnosis: NEGATIVE
High risk HPV: NEGATIVE

## 2023-10-13 DIAGNOSIS — M533 Sacrococcygeal disorders, not elsewhere classified: Secondary | ICD-10-CM | POA: Diagnosis not present

## 2023-10-14 DIAGNOSIS — H11153 Pinguecula, bilateral: Secondary | ICD-10-CM | POA: Diagnosis not present

## 2023-10-14 DIAGNOSIS — H18413 Arcus senilis, bilateral: Secondary | ICD-10-CM | POA: Diagnosis not present

## 2023-10-14 DIAGNOSIS — H26493 Other secondary cataract, bilateral: Secondary | ICD-10-CM | POA: Diagnosis not present

## 2023-10-20 DIAGNOSIS — Z419 Encounter for procedure for purposes other than remedying health state, unspecified: Secondary | ICD-10-CM | POA: Diagnosis not present

## 2023-10-22 ENCOUNTER — Ambulatory Visit: Admitting: Orthopaedic Surgery

## 2023-10-22 ENCOUNTER — Encounter: Payer: Self-pay | Admitting: Orthopaedic Surgery

## 2023-10-22 VITALS — BP 141/89 | HR 64 | Ht 62.5 in | Wt 182.0 lb

## 2023-10-22 DIAGNOSIS — G8929 Other chronic pain: Secondary | ICD-10-CM | POA: Diagnosis not present

## 2023-10-22 DIAGNOSIS — M542 Cervicalgia: Secondary | ICD-10-CM | POA: Diagnosis not present

## 2023-10-22 NOTE — Progress Notes (Signed)
 I have not gotten MRI.  Her insurance has denied the MRI of the neck again.  I am waiting for her to have a MRI.  She has neck pain, decreased ROM and more pain of the right side than the left.  She has tightness of the neck muscles and upper trapezius on the right.  She has some right sided paresthesias now.  NV intact, grips good but I am concerned she is getting worse.  She is scheduled to go to a pain clinic.  Encounter Diagnosis  Name Primary?   Neck pain, chronic Yes   I would like her to have the MRI.  We will try again.  Continue her present medicine.  Return in one month.  Call if any problem.  Precautions discussed.  Electronically Signed Pleasant Brilliant, MD 5/14/202510:33 AM

## 2023-10-28 ENCOUNTER — Other Ambulatory Visit: Payer: Self-pay | Admitting: Family Medicine

## 2023-10-30 ENCOUNTER — Telehealth: Payer: Self-pay | Admitting: Orthopaedic Surgery

## 2023-10-30 DIAGNOSIS — R202 Paresthesia of skin: Secondary | ICD-10-CM

## 2023-10-30 NOTE — Telephone Encounter (Signed)
 Dr. Vicente Graham pt - spoke w/the pt, she stated that she is having tingling in her lt hand, thumb, index and middle fingers.  She just wanted him to know and stated she didn't know if it had anything to do with everything she's got going on.

## 2023-10-31 NOTE — Telephone Encounter (Signed)
 I called her to advise and placed order

## 2023-11-03 ENCOUNTER — Other Ambulatory Visit: Payer: Self-pay | Admitting: Gastroenterology

## 2023-11-06 ENCOUNTER — Ambulatory Visit: Payer: Medicaid Other | Admitting: Family Medicine

## 2023-11-06 DIAGNOSIS — Z961 Presence of intraocular lens: Secondary | ICD-10-CM | POA: Diagnosis not present

## 2023-11-06 DIAGNOSIS — H40013 Open angle with borderline findings, low risk, bilateral: Secondary | ICD-10-CM | POA: Diagnosis not present

## 2023-11-06 DIAGNOSIS — H26493 Other secondary cataract, bilateral: Secondary | ICD-10-CM | POA: Diagnosis not present

## 2023-11-06 DIAGNOSIS — H16223 Keratoconjunctivitis sicca, not specified as Sjogren's, bilateral: Secondary | ICD-10-CM | POA: Diagnosis not present

## 2023-11-07 ENCOUNTER — Other Ambulatory Visit: Payer: Self-pay | Admitting: Orthopaedic Surgery

## 2023-11-14 ENCOUNTER — Ambulatory Visit: Payer: Self-pay

## 2023-11-14 ENCOUNTER — Encounter: Payer: Self-pay | Admitting: Family Medicine

## 2023-11-14 NOTE — Telephone Encounter (Signed)
 Pt scheduled within 2 weeks with Rice Chamorro

## 2023-11-14 NOTE — Telephone Encounter (Signed)
 FYI Only or Action Required?: FYI only for provider  Patient was last seen in primary care on 08/06/2023 by Rosanna Comment, FNP. Called Nurse Triage reporting Pain. Symptoms began at age 63. Interventions attempted: Prescription medications: tramadol  and Rest, hydration, or home remedies. Symptoms are: unchanged.  Triage Disposition: See PCP Within 2 Weeks  Patient/caregiver understands and will follow disposition?: Yes  Copied from CRM [id number]. Topic: Clinical - Red Word Triage >> Nov 14, 2023 11:06 AM Chrystal Crape R wrote: Pt is calling to schedule a follow up appointment that she missed however she is Pain and weakness in back and legs. Sometimes it hurts for her to laugh. Reason for Disposition  Weakness is a chronic symptom (recurrent or ongoing AND present > 4 weeks)  Answer Assessment - Initial Assessment Questions 1. DESCRIPTION: "Describe how you are feeling."     Fatigued, weak, painful legs and back.  2. SEVERITY: "How bad is it?"  "Can you stand and walk?"   - MILD (0-3): Feels weak or tired, but does not interfere with work, school or normal activities.   - MODERATE (4-7): Able to stand and walk; weakness interferes with work, school, or normal activities.   - SEVERE (8-10): Unable to stand or walk; unable to do usual activities.     moderate 3. ONSET: "When did these symptoms begin?" (e.g., hours, days, weeks, months)     Chronic onset when she was 63 years old 4. CAUSE: "What do you think is causing the weakness or fatigue?" (e.g., not drinking enough fluids, medical problem, trouble sleeping)     pain 5. NEW MEDICINES:  "Have you started on any new medicines recently?" (e.g., opioid pain medicines, benzodiazepines, muscle relaxants, antidepressants, antihistamines, neuroleptics, beta blockers)     No-Pain clinic-has not had appointment yet 6. OTHER SYMPTOMS: "Do you have any other symptoms?" (e.g., chest pain, fever, cough, SOB, vomiting, diarrhea, bleeding, other areas  of pain) No acute  Additional info: missed 4 month follow up due to conflicting appointments  Protocols used: Weakness (Generalized) and Fatigue-A-AH

## 2023-11-19 ENCOUNTER — Ambulatory Visit: Admitting: Orthopaedic Surgery

## 2023-11-19 ENCOUNTER — Other Ambulatory Visit: Payer: Self-pay | Admitting: Family Medicine

## 2023-11-19 DIAGNOSIS — G8929 Other chronic pain: Secondary | ICD-10-CM

## 2023-11-20 DIAGNOSIS — Z419 Encounter for procedure for purposes other than remedying health state, unspecified: Secondary | ICD-10-CM | POA: Diagnosis not present

## 2023-11-21 DIAGNOSIS — M5416 Radiculopathy, lumbar region: Secondary | ICD-10-CM | POA: Diagnosis not present

## 2023-11-21 DIAGNOSIS — M5412 Radiculopathy, cervical region: Secondary | ICD-10-CM | POA: Diagnosis not present

## 2023-11-24 ENCOUNTER — Other Ambulatory Visit: Payer: Self-pay | Admitting: Family Medicine

## 2023-11-24 DIAGNOSIS — M5416 Radiculopathy, lumbar region: Secondary | ICD-10-CM | POA: Diagnosis not present

## 2023-11-24 DIAGNOSIS — Z79891 Long term (current) use of opiate analgesic: Secondary | ICD-10-CM | POA: Diagnosis not present

## 2023-11-24 DIAGNOSIS — M48061 Spinal stenosis, lumbar region without neurogenic claudication: Secondary | ICD-10-CM | POA: Diagnosis not present

## 2023-11-24 DIAGNOSIS — M961 Postlaminectomy syndrome, not elsewhere classified: Secondary | ICD-10-CM | POA: Diagnosis not present

## 2023-11-24 DIAGNOSIS — G8929 Other chronic pain: Secondary | ICD-10-CM | POA: Diagnosis not present

## 2023-11-26 ENCOUNTER — Ambulatory Visit: Admitting: Orthopaedic Surgery

## 2023-11-26 ENCOUNTER — Encounter: Payer: Self-pay | Admitting: Orthopaedic Surgery

## 2023-11-26 VITALS — BP 125/75 | HR 69 | Ht 62.5 in | Wt 181.8 lb

## 2023-11-26 DIAGNOSIS — M542 Cervicalgia: Secondary | ICD-10-CM

## 2023-11-26 DIAGNOSIS — G8929 Other chronic pain: Secondary | ICD-10-CM

## 2023-11-26 NOTE — Patient Instructions (Signed)
 An EMG (electromyography) test is a diagnostic procedure that evaluates the health and function of muscles and the nerves that control them. It measures electrical activity in muscles, both at rest and during contraction, to help diagnose neuromuscular conditions. Nerve conduction studies are often performed alongside EMG to assess nerve function and speed of electrical signals.   Follow up in 1 month with Dr. Iline Mallory for EMG results

## 2023-11-26 NOTE — Progress Notes (Signed)
 I have not had the MRI of my neck.  Her insurance requires EMG of upper extremity before MRI of neck.  She is to get EMGs late next week.  She is unchanged.  She has been to the pain clinic and they want MRI of the lumbar spine.  Neck has good ROM but she has right sided pain and numbness and weakness.  I still feel she needs MRI of neck.  Encounter Diagnosis  Name Primary?   Neck pain, chronic Yes   Get EMGs.  Try to get MRI.  Return in one month.  Call if any problem.  Precautions discussed.  Electronically Signed Pleasant Brilliant, MD 6/18/20253:12 PM

## 2023-11-27 ENCOUNTER — Ambulatory Visit: Payer: Self-pay

## 2023-11-27 VITALS — BP 111/74 | HR 94 | Ht 62.0 in | Wt 182.1 lb

## 2023-11-27 DIAGNOSIS — L309 Dermatitis, unspecified: Secondary | ICD-10-CM

## 2023-11-27 DIAGNOSIS — I10 Essential (primary) hypertension: Secondary | ICD-10-CM | POA: Diagnosis not present

## 2023-11-27 DIAGNOSIS — F418 Other specified anxiety disorders: Secondary | ICD-10-CM | POA: Diagnosis not present

## 2023-11-27 DIAGNOSIS — G4700 Insomnia, unspecified: Secondary | ICD-10-CM | POA: Diagnosis not present

## 2023-11-27 DIAGNOSIS — M5441 Lumbago with sciatica, right side: Secondary | ICD-10-CM | POA: Diagnosis not present

## 2023-11-27 DIAGNOSIS — G8929 Other chronic pain: Secondary | ICD-10-CM

## 2023-11-27 DIAGNOSIS — N898 Other specified noninflammatory disorders of vagina: Secondary | ICD-10-CM

## 2023-11-27 DIAGNOSIS — M5489 Other dorsalgia: Secondary | ICD-10-CM | POA: Diagnosis not present

## 2023-11-27 MED ORDER — AMLODIPINE BESYLATE 5 MG PO TABS: 5.0000 mg | ORAL_TABLET | Freq: Every day | ORAL | 3 refills | Status: AC

## 2023-11-27 MED ORDER — DIAZEPAM 10 MG PO TABS
10.0000 mg | ORAL_TABLET | Freq: Two times a day (BID) | ORAL | 0 refills | Status: AC | PRN
Start: 2023-11-27 — End: ?

## 2023-11-27 MED ORDER — DULOXETINE HCL 30 MG PO CPEP: 30.0000 mg | ORAL_CAPSULE | Freq: Every day | ORAL | 3 refills | Status: AC

## 2023-11-27 MED ORDER — METRONIDAZOLE 500 MG PO TABS: 500.0000 mg | ORAL_TABLET | Freq: Three times a day (TID) | ORAL | 0 refills | Status: AC

## 2023-11-27 MED ORDER — TRAZODONE HCL 50 MG PO TABS
50.0000 mg | ORAL_TABLET | Freq: Every evening | ORAL | 1 refills | Status: AC | PRN
Start: 2023-11-27 — End: ?

## 2023-11-27 MED ORDER — METHOCARBAMOL 750 MG PO TABS: 750.0000 mg | ORAL_TABLET | Freq: Three times a day (TID) | ORAL | 2 refills | Status: AC | PRN

## 2023-11-27 NOTE — Progress Notes (Signed)
 Established Patient Office Visit  Subjective   Patient ID: Jacqueline [name], female    DOB: 07/08/1960  Age: 63 y.o. MRN: [id number]  Chief Complaint  Patient presents with   Medical Management of Chronic Issues    Pt here for Pain and follow up    HPI Pain:  she is seeing Dr. Brenna (ortho) for ongoing neck pain, which she has had since 2020. Has EMG scheduled next week, and then she can get an MRI on her neck.  Here today for refills on HTN, dermatitis, chronic back pain, and insomnia medications.  She reports stability with these medications.   She denies any new concerns at this time.   --------------------------------------------------------------------------------------------------- Patient Active Problem List   Diagnosis Date Noted   Vaginal irritation 11/29/2023   Situational anxiety 11/29/2023   Elevated LFTs 08/26/2023   Viral illness 08/06/2023   Epigastric pain 07/07/2023   S/P arthroscopy of left knee debridement of ACL/ lateral meniscectomy 12/25/22 01/02/2023   Bucket-handle tear of lateral meniscus of left knee as current injury 12/25/2022   Left anterior cruciate ligament tear 12/25/2022   Acute non-recurrent maxillary sinusitis 10/26/2022   Mucopurulent conjunctivitis of both eyes 10/26/2022   Acute bronchitis 10/25/2022   Maxillary sinusitis, acute 10/25/2022   Conjunctivitis 10/25/2022   Neck pain, chronic 08/28/2022   Sleep apnea 08/28/2022   Need for immunization against influenza 02/28/2022   Screening for cervical cancer 12/21/2021   H. pylori infection 12/05/2021   Snoring 11/23/2021   Insomnia 11/23/2021   Annual physical exam 10/16/2021   Dermatitis 10/16/2021   Need for varicella vaccine 10/16/2021   Diarrhea 07/05/2021   Stomach cramps 07/05/2021   Nausea & vomiting 07/05/2021   Encounter for vaccination 07/05/2021   Right foot pain 05/16/2021   Dysphagia 05/01/2021   Right shoulder pain 12/27/2020   Rash 12/13/2020   Chronic back pain  04/04/2020   History of back surgery 04/04/2020   Radiculopathy 07/28/2019   Borderline diabetes 12/25/2018   Hypertension 06/30/2018   S/P abdominal supracervical subtotal hysterectomy 11/06/2016   Trigger middle finger of right hand 04/24/2016   GERD (gastroesophageal reflux disease) 08/04/2014   Shoulder strain 07/26/2014   Degenerative arthritis of thumb 07/26/2014   Left knee pain 02/28/2014   Hip pain 04/15/2013   Epicondylitis, lateral (tennis elbow) 04/13/2013   Constipation 09/15/2012   Hyperlipidemia 11/28/2011   Obesity, Class I, BMI 30.0-34.9 (see actual BMI) 09/15/2011   ALLERGIC RHINITIS, SEASONAL 11/13/2007      ROS    Objective:     BP 111/74   Pulse 94   Ht 5' 2 (1.575 m)   Wt 182 lb 1.9 oz (82.6 kg)   LMP 10/26/2016 (Exact Date)   SpO2 95%   BMI 33.31 kg/m  BP Readings from Last 3 Encounters:  11/27/23 111/74  11/26/23 125/75  10/22/23 (!) 141/89   Wt Readings from Last 3 Encounters:  11/27/23 182 lb 1.9 oz (82.6 kg)  11/26/23 181 lb 12.8 oz (82.5 kg)  10/22/23 182 lb (82.6 kg)      Physical Exam Vitals and nursing note reviewed.  Constitutional:      Appearance: Normal appearance.  HENT:     Head: Normocephalic.     Right Ear: Tympanic membrane, ear canal and external ear normal.     Left Ear: Tympanic membrane, ear canal and external ear normal.     Nose: Nose normal.     Mouth/Throat:     Mouth: Mucous membranes  are moist.     Pharynx: Oropharynx is clear.   Cardiovascular:     Rate and Rhythm: Normal rate and regular rhythm.  Pulmonary:     Effort: Pulmonary effort is normal.     Breath sounds: Normal breath sounds.   Musculoskeletal:     Cervical back: Neck supple. Pain with movement and spinous process tenderness present. Decreased range of motion.   Skin:    General: Skin is warm and dry.   Neurological:     Mental Status: She is alert and oriented to person, place, and time.   Psychiatric:        Mood and Affect:  Mood normal.        Thought Content: Thought content normal.      No results found for any visits on 11/27/23.  Last CBC Lab Results  Component Value Date   WBC 6.0 07/14/2023   HGB 12.0 07/14/2023   HCT 36.8 07/14/2023   MCV 95 07/14/2023   MCH 30.8 07/14/2023   RDW 14.4 07/14/2023   PLT 254 07/14/2023   Last metabolic panel Lab Results  Component Value Date   GLUCOSE 81 07/14/2023   NA 136 07/14/2023   K 4.2 07/14/2023   CL 100 07/14/2023   CO2 24 07/14/2023   BUN 12 07/14/2023   CREATININE 0.93 07/14/2023   EGFR 70 07/14/2023   CALCIUM  9.6 07/14/2023   PROT 7.0 08/29/2023   ALBUMIN  4.3 08/29/2023   LABGLOB 3.3 07/14/2023   AGRATIO 1.4 09/06/2022   BILITOT 0.8 08/29/2023   ALKPHOS 129 (H) 08/29/2023   AST 93 (H) 08/29/2023   ALT 121 (H) 08/29/2023   ANIONGAP 8 12/24/2022   Last lipids Lab Results  Component Value Date   CHOL 158 07/14/2023   HDL 34 (L) 07/14/2023   LDLCALC 105 (H) 07/14/2023   TRIG 103 07/14/2023   CHOLHDL 4.6 (H) 07/14/2023   Last hemoglobin A1c Lab Results  Component Value Date   HGBA1C 5.4 07/14/2023   Last vitamin D  Lab Results  Component Value Date   VD25OH 50.9 07/14/2023      The 10-year ASCVD risk score (Arnett DK, et al., 2019) is: 5%    Assessment & Plan:   Problem List Items Addressed This Visit       Cardiovascular and Mediastinum   Hypertension - Primary   Controlled Encouraged low-sodium diet with increased physical activity Encouraged to continue take amlodipine  5 mg daily Vitals:   11/27/23 0910  BP: 111/74          Relevant Medications   amLODipine  (NORVASC ) 5 MG tablet     Other   Chronic back pain   She is seeing Dr. Brenna for chronic neck pain, and has EEG scheduled for next week, then will proceed with MRI.   Continue with current dose of Cymbalta  and Robaxin .        Relevant Medications   DULoxetine  (CYMBALTA ) 30 MG capsule   methocarbamol  (ROBAXIN ) 750 MG tablet   traZODone   (DESYREL ) 50 MG tablet   Insomnia   Reports improved sleep with trazodone  25 to 50 mg as needed.   Continue with current dose.       Relevant Medications   traZODone  (DESYREL ) 50 MG tablet   Vaginal irritation   Agree to refill metronidazole  cream as previously prescribed by OB/GYN.         Relevant Medications   metroNIDAZOLE  (FLAGYL ) 500 MG tablet   Situational anxiety   Agree to add  Valium  for upcoming MRI, d/t anxiety.  Advised that she will need to have someone drive her to the MRI due to sedating nature of this medication.   PDMP was reviewed.  No red flags or signs of abuse.        Relevant Medications   DULoxetine  (CYMBALTA ) 30 MG capsule   traZODone  (DESYREL ) 50 MG tablet   diazepam  (VALIUM ) 10 MG tablet    Return in about 6 months (around 05/28/2024) for chronic follow-up with PCP.    Leita Longs, FNP

## 2023-11-29 DIAGNOSIS — N898 Other specified noninflammatory disorders of vagina: Secondary | ICD-10-CM | POA: Insufficient documentation

## 2023-11-29 DIAGNOSIS — F418 Other specified anxiety disorders: Secondary | ICD-10-CM | POA: Insufficient documentation

## 2023-11-29 NOTE — Assessment & Plan Note (Signed)
 Agree to refill metronidazole  cream as previously prescribed by OB/GYN.

## 2023-11-29 NOTE — Assessment & Plan Note (Deleted)
Thanks Kenalog 0.1% cream apply twice daily ?Will refer to dermatology if rashes does not resolve ?

## 2023-11-29 NOTE — Assessment & Plan Note (Signed)
 She is seeing Dr. Brenna for chronic neck pain, and has EEG scheduled for next week, then will proceed with MRI.   Continue with current dose of Cymbalta  and Robaxin .

## 2023-11-29 NOTE — Assessment & Plan Note (Addendum)
 Agree to add Valium  for upcoming MRI, d/t anxiety.  Advised that she will need to have someone drive her to the MRI due to sedating nature of this medication.   PDMP was reviewed.  No red flags or signs of abuse.

## 2023-11-29 NOTE — Assessment & Plan Note (Signed)
 Controlled Encouraged low-sodium diet with increased physical activity Encouraged to continue take amlodipine  5 mg daily Vitals:   11/27/23 0910  BP: 111/74

## 2023-11-29 NOTE — Assessment & Plan Note (Signed)
 Reports improved sleep with trazodone  25 to 50 mg as needed.   Continue with current dose.

## 2023-12-03 ENCOUNTER — Ambulatory Visit: Admitting: Physical Medicine and Rehabilitation

## 2023-12-03 DIAGNOSIS — M79642 Pain in left hand: Secondary | ICD-10-CM | POA: Diagnosis not present

## 2023-12-03 DIAGNOSIS — M542 Cervicalgia: Secondary | ICD-10-CM | POA: Diagnosis not present

## 2023-12-03 DIAGNOSIS — R531 Weakness: Secondary | ICD-10-CM

## 2023-12-03 DIAGNOSIS — R202 Paresthesia of skin: Secondary | ICD-10-CM

## 2023-12-03 NOTE — Progress Notes (Signed)
 Jacqueline [name] - 63 y.o. female MRN [id number]  Date of birth: 17-Sep-1960  Office Visit Note: Visit Date: 12/03/2023 PCP: Zarwolo, Gloria, FNP Referred by: Brenna Lin, MD  Subjective: Chief Complaint  Patient presents with   Left Hand - Numbness, Weakness   HPI: Jacqueline [name] is a 63 y.o. female who comes in today at the request of Dr. Lin Brenna for evaluation and management of chronic, worsening and severe pain, numbness and tingling in the Left upper extremities.  Patient is Right hand dominant.  She reports a somewhat complicated picture of neck pain and bilateral shoulder pain but also with the left hand numbness globally.  She has pain in the left hand and weakness.  Not exactly nocturnal complaints but pain can be at night and during the day.  Worse with activity.  She has weakness with holding objects sometimes she drops them.  She has some pain with opening things.  She has had a history in the past of right hand issues.  Her cervical spine has been waiting on an MRI which according to Dr. Brenna her insurance needed electrodiagnostic study prior to MRI.  Her left hand is not specific dermatomal patterns but really global all over the hand.  She has not gotten any relief with medications including diclofenac  and Cymbalta .  Her case is complicated by what appears to be prediabetes by lab values.  She also has had a previous lumbar fusion and is treated by Dr. Beuford and Dr. Scot Flatten.   I spent more than 30 minutes speaking face-to-face with the patient with 50% of the time in counseling and discussing coordination of care.      Review of Systems  Musculoskeletal:  Positive for back pain, joint pain and neck pain.  Neurological:  Positive for tingling and weakness.  All other systems reviewed and are negative.  Otherwise per HPI.  Assessment & Plan: Visit Diagnoses:    ICD-10-CM   1. Paresthesia of skin  R20.2 NCV with EMG (electromyography)    2. Cervicalgia  M54.2  NCV with EMG (electromyography)    3. Pain in left hand  M79.642     4. Weakness  R53.1        Plan: Impression: Clinically seems that that hand may be a separate issue than the neck and shoulder pain however cannot rule out radiculopathy with paresthesia into the hand.  Electrodiagnostic study performed today.  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   There is no significant electrodiagnostic evidence of any other focal nerve entrapment, brachial plexopathy or cervical radiculopathy.  As you know, this particular electrodiagnostic study cannot rule out chemical radiculitis or sensory only radiculopathy.   **This electrodiagnostic study cannot rule out small fiber polyneuropathy and dysesthesias from central pain syndromes such as stroke or central pain sensitization syndromes such as fibromyalgia.  Myotomal referral pain from trigger points is also not excluded.  Recommendations: 1.  Follow-up with referring physician.  Recommend cervical spine MRI. 2.  Continue current management of symptoms.  Meds & Orders: No orders of the defined types were placed in this encounter.   Orders Placed This Encounter  Procedures   NCV with EMG (electromyography)    Follow-up: Return for Lin Brenna, MD.   Procedures: No procedures performed  EMG & NCV Findings: Evaluation of the left median (across palm) sensory nerve showed prolonged distal peak latency (Wrist,  4.4 ms).  All remaining nerves (as indicated in the following tables) were within normal limits.    All examined muscles (as indicated in the following table) showed no evidence of electrical instability.    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   There is no significant electrodiagnostic evidence of any  other focal nerve entrapment, brachial plexopathy or cervical radiculopathy.  As you know, this particular electrodiagnostic study cannot rule out chemical radiculitis or sensory only radiculopathy.   **This electrodiagnostic study cannot rule out small fiber polyneuropathy and dysesthesias from central pain syndromes such as stroke or central pain sensitization syndromes such as fibromyalgia.  Myotomal referral pain from trigger points is also not excluded.  Recommendations: 1.  Follow-up with referring physician.  Recommend cervical spine MRI. 2.  Continue current management of symptoms.  ___________________________ Prentice Masters FAAPMR Board Certified, American Board of Physical Medicine and Rehabilitation    Nerve Conduction Studies Anti Sensory Summary Table   Stim Site NR Peak (ms) Norm Peak (ms) P-T Amp (V) Norm P-T Amp Site1 Site2 Delta-P (ms) Dist (cm) Vel (m/s) Norm Vel (m/s)  Left Median Acr Palm Anti Sensory (2nd Digit)  32.7C  Wrist    *4.4 <3.6 15.0 >10 Wrist Palm 2.6 0.0    Palm    1.8 <2.0 9.8         Left Radial Anti Sensory (Base 1st Digit)  32.2C  Wrist    2.0 <3.1 37.7  Wrist Base 1st Digit 2.0 0.0    Left Ulnar Anti Sensory (5th Digit)  32.9C  Wrist    3.1 <3.7 20.6 >15.0 Wrist 5th Digit 3.1 14.0 45 >38   Motor Summary Table   Stim Site NR Onset (ms) Norm Onset (ms) O-P Amp (mV) Norm O-P Amp Site1 Site2 Delta-0 (ms) Dist (cm) Vel (m/s) Norm Vel (m/s)  Left Median Motor (Abd Poll Brev)  32.3C  Wrist    4.0 <4.2 10.1 >5 Elbow Wrist 3.6 21.0 58 >50  Elbow    7.6  9.8         Left Ulnar Motor (Abd Dig Min)  32.5C  Wrist    2.8 <4.2 8.7 >3 B Elbow Wrist 3.5 20.0 57 >53  B Elbow    6.3  8.4  A Elbow B Elbow 1.3 10.0 77 >53  A Elbow    7.6  8.3          EMG   Side Muscle Nerve Root Ins Act Fibs Psw Amp Dur Poly Recrt Int Bruna Comment  Left Abd Poll Brev Median C8-T1 Nml Nml Nml Nml Nml 0 Nml Nml   Left 1stDorInt Ulnar C8-T1 Nml Nml Nml Nml Nml 0 Nml Nml    Left PronatorTeres Median C6-7 Nml Nml Nml Nml Nml 0 Nml Nml   Left Biceps Musculocut C5-6 Nml Nml Nml Nml Nml 0 Nml Nml   Left Deltoid Axillary C5-6 Nml Nml Nml Nml Nml 0 Nml Nml     Nerve Conduction Studies Anti Sensory Left/Right Comparison   Stim Site L Lat (ms) R Lat (ms) L-R Lat (ms) L Amp (V) R Amp (V) L-R Amp (%) Site1 Site2 L Vel (m/s) R Vel (m/s) L-R Vel (m/s)  Median Acr Palm Anti Sensory (2nd Digit)  32.7C  Wrist *4.4   15.0   Wrist Palm     Palm 1.8   9.8  Radial Anti Sensory (Base 1st Digit)  32.2C  Wrist 2.0   37.7   Wrist Base 1st Digit     Ulnar Anti Sensory (5th Digit)  32.9C  Wrist 3.1   20.6   Wrist 5th Digit 45     Motor Left/Right Comparison   Stim Site L Lat (ms) R Lat (ms) L-R Lat (ms) L Amp (mV) R Amp (mV) L-R Amp (%) Site1 Site2 L Vel (m/s) R Vel (m/s) L-R Vel (m/s)  Median Motor (Abd Poll Brev)  32.3C  Wrist 4.0   10.1   Elbow Wrist 58    Elbow 7.6   9.8         Ulnar Motor (Abd Dig Min)  32.5C  Wrist 2.8   8.7   B Elbow Wrist 57    B Elbow 6.3   8.4   A Elbow B Elbow 77    A Elbow 7.6   8.3            Waveforms:            Clinical History: No specialty comments available.   She reports that she has never smoked. She has never used smokeless tobacco.  Recent Labs    12/11/22 1056 03/06/23 0826 07/14/23 1128  HGBA1C 6.1* 5.8* 5.4    Objective:  VS:  HT:    WT:   BMI:     BP:   HR: bpm  TEMP: ( )  RESP:  Physical Exam Vitals and nursing note reviewed.  Constitutional:      General: She is not in acute distress.    Appearance: Normal appearance. She is well-developed. She is not ill-appearing.  HENT:     Head: Normocephalic and atraumatic.   Eyes:     Conjunctiva/sclera: Conjunctivae normal.     Pupils: Pupils are equal, round, and reactive to light.    Cardiovascular:     Rate and Rhythm: Normal rate.     Pulses: Normal pulses.  Pulmonary:     Effort: Pulmonary effort is normal.   Musculoskeletal:         General: No swelling, tenderness or deformity.     Right lower leg: No edema.     Left lower leg: No edema.     Comments: Inspection reveals no atrophy of the bilateral APB or FDI or hand intrinsics. There is no swelling, color changes, allodynia or dystrophic changes. There is 5 out of 5 strength in the bilateral wrist extension, finger abduction and long finger flexion. There is intact sensation to light touch in all dermatomal and peripheral nerve distributions. There is a negative Froment's test bilaterally.  There is a negative Phalen's test bilaterally. There is a negative Hoffmann's test bilaterally.   Skin:    General: Skin is warm and dry.     Findings: No erythema or rash.   Neurological:     General: No focal deficit present.     Mental Status: She is alert and oriented to person, place, and time.     Cranial Nerves: No cranial nerve deficit.     Sensory: No sensory deficit.     Motor: No weakness or abnormal muscle tone.     Coordination: Coordination normal.     Gait: Gait abnormal.   Psychiatric:        Mood and Affect: Mood normal.        Behavior: Behavior normal.     Ortho Exam  Imaging: No results found.  Past Medical/Family/Surgical/Social  History: Medications & Allergies reviewed per EMR, new medications updated. Patient Active Problem List   Diagnosis Date Noted   Vaginal irritation 11/29/2023   Situational anxiety 11/29/2023   Elevated LFTs 08/26/2023   Viral illness 08/06/2023   Epigastric pain 07/07/2023   S/P arthroscopy of left knee debridement of ACL/ lateral meniscectomy 12/25/22 01/02/2023   Bucket-handle tear of lateral meniscus of left knee as current injury 12/25/2022   Left anterior cruciate ligament tear 12/25/2022   Acute non-recurrent maxillary sinusitis 10/26/2022   Mucopurulent conjunctivitis of both eyes 10/26/2022   Acute bronchitis 10/25/2022   Maxillary sinusitis, acute 10/25/2022   Conjunctivitis 10/25/2022   Neck pain,  chronic 08/28/2022   Sleep apnea 08/28/2022   Need for immunization against influenza 02/28/2022   Screening for cervical cancer 12/21/2021   H. pylori infection 12/05/2021   Snoring 11/23/2021   Insomnia 11/23/2021   Annual physical exam 10/16/2021   Dermatitis 10/16/2021   Need for varicella vaccine 10/16/2021   Diarrhea 07/05/2021   Stomach cramps 07/05/2021   Nausea & vomiting 07/05/2021   Encounter for vaccination 07/05/2021   Right foot pain 05/16/2021   Dysphagia 05/01/2021   Right shoulder pain 12/27/2020   Rash 12/13/2020   Chronic back pain 04/04/2020   History of back surgery 04/04/2020   Radiculopathy 07/28/2019   Borderline diabetes 12/25/2018   Hypertension 06/30/2018   S/P abdominal supracervical subtotal hysterectomy 11/06/2016   Trigger middle finger of right hand 04/24/2016   GERD (gastroesophageal reflux disease) 08/04/2014   Shoulder strain 07/26/2014   Degenerative arthritis of thumb 07/26/2014   Left knee pain 02/28/2014   Hip pain 04/15/2013   Epicondylitis, lateral (tennis elbow) 04/13/2013   Constipation 09/15/2012   Hyperlipidemia 11/28/2011   Obesity, Class I, BMI 30.0-34.9 (see actual BMI) 09/15/2011   ALLERGIC RHINITIS, SEASONAL 11/13/2007   Past Medical History:  Diagnosis Date   Anemia    Arthritis    per patient, back   Complication of anesthesia 2018   per patient HR dropped during partial hysterectomy surgery   Fibroid uterus    GERD (gastroesophageal reflux disease)    H/O total hysterectomy    Heart murmur    Hyperlipidemia    Borderline   Hypertension    Numbness and tingling in right hand    Pre-diabetes    borderline   Sleep apnea    Family History  Problem Relation Age of Onset   Breast cancer Maternal Aunt    Hypertension Maternal Aunt    Cancer Maternal Aunt        Breast   Cancer Maternal Grandmother         lymphnodes   Colon cancer Neg Hx    Past Surgical History:  Procedure Laterality Date   BACK SURGERY      BALLOON DILATION N/A 05/22/2021   Procedure: BALLOON DILATION;  Surgeon: Cindie Carlin POUR, DO;  [facility]: [facility];  Service: Endoscopy;  Laterality: N/A;   BIOPSY  11/18/2022   Procedure: BIOPSY;  Surgeon: Shaaron Lamar HERO, MD;  [facility]: [facility];  Service: Endoscopy;;   CATARACT EXTRACTION W/PHACO Left 06/27/2023   Procedure: CATARACT EXTRACTION PHACO AND INTRAOCULAR LENS PLACEMENT (IOC);  Surgeon: Harrie Agent, MD;  Location: [facility];  Service: Ophthalmology;  Laterality: Left;  CDE: 3.56   CATARACT EXTRACTION W/PHACO Right 07/11/2023   Procedure: CATARACT EXTRACTION PHACO AND INTRAOCULAR LENS PLACEMENT (IOC);  Surgeon: Harrie Agent, MD;  Location: [facility];  Service: Ophthalmology;  Laterality: Right;  CDE: 2.82   CHOLECYSTECTOMY     COLONOSCOPY  2003   Dr. Shaaron: Anal canal hemorrhoids   COLONOSCOPY N/A 08/25/2014   Normal colonoscopy.  Next colonoscopy in 2026.  Procedure: COLONOSCOPY;  Surgeon: Lamar CHRISTELLA Shaaron, MD;  [facility]: [facility];  Service: Endoscopy;  Laterality: N/A;  1200   ESOPHAGOGASTRODUODENOSCOPY N/A 08/25/2014   Procedure: ESOPHAGOGASTRODUODENOSCOPY (EGD);  Surgeon: Lamar CHRISTELLA Shaaron, MD;  [facility]: [facility];  Service: Endoscopy;  Laterality: N/A;   ESOPHAGOGASTRODUODENOSCOPY (EGD) WITH PROPOFOL  N/A 05/22/2021   Web and distal esophagus, dilated, gastriti, biopsy positive for mildly active H. pylori gastritis.  s procedure: ESOPHAGOGASTRODUODENOSCOPY (EGD) WITH PROPOFOL ;  Surgeon: Cindie Carlin POUR, DO;  [facility]: [facility];  Service: Endoscopy;  Laterality: N/A;  9:15am   ESOPHAGOGASTRODUODENOSCOPY (EGD) WITH PROPOFOL  N/A 11/18/2022   Procedure: ESOPHAGOGASTRODUODENOSCOPY (EGD) WITH PROPOFOL ;  Surgeon: Shaaron Lamar CHRISTELLA, MD;  [facility];  Service: Endoscopy;  Laterality: N/A;  2:15 pm, asa 2   KNEE ARTHROSCOPY WITH MEDIAL MENISECTOMY Left 12/25/2022   Procedure: KNEE ARTHROSCOPY WITH PARTIAL LATERAL MENISCECTOMY DEBRIDEMENT ACL;  Surgeon:  Margrette Taft BRAVO, MD;  Location: [facility];  Service: Orthopedics;  Laterality: Left;   MALONEY DILATION N/A 11/18/2022   Procedure: AGAPITO DILATION;  Surgeon: Shaaron Lamar CHRISTELLA, MD;  [facility];  Service: Endoscopy;  Laterality: N/A;   SALPINGOOPHORECTOMY Bilateral 11/06/2016   Procedure: BILATERAL SALPINGO OOPHORECTOMY;  Surgeon: Jayne Vonn DEL, MD;  Location: [facility];  Service: Gynecology;  Laterality: Bilateral;   SUPRACERVICAL ABDOMINAL HYSTERECTOMY N/A 11/06/2016   Procedure: HYSTERECTOMY SUPRACERVICAL ABDOMINAL;  Surgeon: Jayne Vonn DEL, MD;  Location: [facility];  Service: Gynecology;  Laterality: N/A;   TRANSFORAMINAL LUMBAR INTERBODY FUSION (TLIF) WITH PEDICLE SCREW FIXATION 2 LEVEL Left 07/28/2019   Procedure: LEFT-SIDED LUMBAR 4- 5, LUMBAR 5 -SACRUM1 TRANSFORAMINAL LUMBAR INTERBODY FUSION WITH INSTRUMENTATION AND ALLOGRAFT;  Surgeon: Beuford Anes, MD;  Location: [facility];  Service: Orthopedics;  Laterality: Left;   TUBAL LIGATION     Social History   Occupational History   Occupation: PT at [employer]  Tobacco Use   Smoking status: Never   Smokeless tobacco: Never   Tobacco comments:    Never smoked  Vaping Use   Vaping status: Never Used  Substance and Sexual Activity   Alcohol use: No    Alcohol/week: 0.0 standard drinks of alcohol   Drug use: No   Sexual activity: Yes    Birth control/protection: Post-menopausal, Surgical

## 2023-12-03 NOTE — Progress Notes (Signed)
 Pain Scale   Average Pain 2 Patient advising she is right hand dominate. Patient advising that she has numbness and tingling in her left hand and also states that she has noticed some weakness in the left hand.. Patient also wants to advise she is waiting on an MRI of neck, due to her C/O neck pain to bilateral shoulders.        +Driver, -BT, -Dye Allergies.

## 2023-12-05 NOTE — Procedures (Unsigned)
 EMG & NCV Findings: Evaluation of the left median (across palm) sensory nerve showed prolonged distal peak latency (Wrist, 4.4 ms).  All remaining nerves (as indicated in the following tables) were within normal limits.    All examined muscles (as indicated in the following table) showed no evidence of electrical instability.    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 There is no significant electrodiagnostic evidence of any other focal nerve entrapment, brachial plexopathy or cervical radiculopathy.   Recommendations: 1.  Follow-up with referring physician. 2.  Continue current management of symptoms.  ___________________________ Prentice Masters FAAPMR Board Certified, American Board of Physical Medicine and Rehabilitation    Nerve Conduction Studies Anti Sensory Summary Table   Stim Site NR Peak (ms) Norm Peak (ms) P-T Amp (V) Norm P-T Amp Site1 Site2 Delta-P (ms) Dist (cm) Vel (m/s) Norm Vel (m/s)  Left Median Acr Palm Anti Sensory (2nd Digit)  32.7C  Wrist    *4.4 <3.6 15.0 >10 Wrist Palm 2.6 0.0    Palm    1.8 <2.0 9.8         Left Radial Anti Sensory (Base 1st Digit)  32.2C  Wrist    2.0 <3.1 37.7  Wrist Base 1st Digit 2.0 0.0    Left Ulnar Anti Sensory (5th Digit)  32.9C  Wrist    3.1 <3.7 20.6 >15.0 Wrist 5th Digit 3.1 14.0 45 >38   Motor Summary Table   Stim Site NR Onset (ms) Norm Onset (ms) O-P Amp (mV) Norm O-P Amp Site1 Site2 Delta-0 (ms) Dist (cm) Vel (m/s) Norm Vel (m/s)  Left Median Motor (Abd Poll Brev)  32.3C  Wrist    4.0 <4.2 10.1 >5 Elbow Wrist 3.6 21.0 58 >50  Elbow    7.6  9.8         Left Ulnar Motor (Abd Dig Min)  32.5C  Wrist    2.8 <4.2 8.7 >3 B Elbow Wrist 3.5 20.0 57 >53  B Elbow    6.3  8.4  A Elbow B Elbow 1.3 10.0 77 >53  A Elbow    7.6  8.3          EMG   Side Muscle Nerve Root Ins Act Fibs Psw  Amp Dur Poly Recrt Int Bruna Comment  Left Abd Poll Brev Median C8-T1 Nml Nml Nml Nml Nml 0 Nml Nml   Left 1stDorInt Ulnar C8-T1 Nml Nml Nml Nml Nml 0 Nml Nml   Left PronatorTeres Median C6-7 Nml Nml Nml Nml Nml 0 Nml Nml   Left Biceps Musculocut C5-6 Nml Nml Nml Nml Nml 0 Nml Nml   Left Deltoid Axillary C5-6 Nml Nml Nml Nml Nml 0 Nml Nml     Nerve Conduction Studies Anti Sensory Left/Right Comparison   Stim Site L Lat (ms) R Lat (ms) L-R Lat (ms) L Amp (V) R Amp (V) L-R Amp (%) Site1 Site2 L Vel (m/s) R Vel (m/s) L-R Vel (m/s)  Median Acr Palm Anti Sensory (2nd Digit)  32.7C  Wrist *4.4   15.0   Wrist Palm     Palm 1.8   9.8         Radial Anti Sensory (Base 1st Digit)  32.2C  Wrist 2.0   37.7   Wrist Base 1st Digit     Ulnar Anti Sensory (5th Digit)  32.9C  Wrist 3.1   20.6  Wrist 5th Digit 45     Motor Left/Right Comparison   Stim Site L Lat (ms) R Lat (ms) L-R Lat (ms) L Amp (mV) R Amp (mV) L-R Amp (%) Site1 Site2 L Vel (m/s) R Vel (m/s) L-R Vel (m/s)  Median Motor (Abd Poll Brev)  32.3C  Wrist 4.0   10.1   Elbow Wrist 58    Elbow 7.6   9.8         Ulnar Motor (Abd Dig Min)  32.5C  Wrist 2.8   8.7   B Elbow Wrist 57    B Elbow 6.3   8.4   A Elbow B Elbow 77    A Elbow 7.6   8.3            Waveforms:

## 2023-12-08 ENCOUNTER — Encounter: Payer: Self-pay | Admitting: Physical Medicine and Rehabilitation

## 2023-12-15 ENCOUNTER — Telehealth: Payer: Self-pay | Admitting: Orthopaedic Surgery

## 2023-12-15 NOTE — Telephone Encounter (Signed)
 Dr. Janae pt - pt lvm for Jacqueline [name], stated she wants to check the status for her authorization for her MRI.  [phone]

## 2023-12-16 NOTE — Telephone Encounter (Signed)
 Called pt and let her know we are still waiting on PA for MRI cervical spine. Pt is also scheduled to come back to see Dr. Brenna on 07/16

## 2023-12-17 DIAGNOSIS — M5417 Radiculopathy, lumbosacral region: Secondary | ICD-10-CM | POA: Diagnosis not present

## 2023-12-17 DIAGNOSIS — Z79891 Long term (current) use of opiate analgesic: Secondary | ICD-10-CM | POA: Diagnosis not present

## 2023-12-18 NOTE — Telephone Encounter (Signed)
 As of 6/30: [facility] has received the upload and is in clinical review.

## 2023-12-20 DIAGNOSIS — Z419 Encounter for procedure for purposes other than remedying health state, unspecified: Secondary | ICD-10-CM | POA: Diagnosis not present

## 2023-12-21 ENCOUNTER — Other Ambulatory Visit: Payer: Self-pay | Admitting: Family Medicine

## 2023-12-22 ENCOUNTER — Telehealth: Payer: Self-pay | Admitting: Orthopaedic Surgery

## 2023-12-22 NOTE — Telephone Encounter (Signed)
 Dr. Janae pt - pt called, wanted to rs her appt, so I took care of that, but she is wanting to know if there was any news on her MRI autho.  [phone]

## 2023-12-22 NOTE — Telephone Encounter (Signed)
 Called pt and she is aware we are waiting on authorization from her insurance

## 2023-12-24 ENCOUNTER — Ambulatory Visit: Admitting: Orthopaedic Surgery

## 2023-12-29 NOTE — Telephone Encounter (Signed)
 Waiting on Dr. MARLA for Wednesday to do a P2P on this pt.

## 2023-12-31 ENCOUNTER — Ambulatory Visit: Admitting: Orthopaedic Surgery

## 2023-12-31 NOTE — Telephone Encounter (Signed)
 Pt is also going to Dr. Oneil Priestly orthopedic surgeon for mri- it was advised pt to stay with dr. Priestly since he was the last one that placed order for mri.

## 2024-01-07 ENCOUNTER — Other Ambulatory Visit: Payer: Self-pay | Admitting: Gastroenterology

## 2024-01-16 ENCOUNTER — Ambulatory Visit
Admission: RE | Admit: 2024-01-16 | Discharge: 2024-01-16 | Disposition: A | Discharge status: Home / Self Care | Source: Ambulatory Visit | Attending: Family Medicine | Admitting: Family Medicine

## 2024-01-16 ENCOUNTER — Ambulatory Visit: Payer: Self-pay | Admitting: *Deleted

## 2024-01-16 VITALS — BP 121/76 | HR 76 | Temp 98.5°F | Resp 18

## 2024-01-16 DIAGNOSIS — S90811A Abrasion, right foot, initial encounter: Secondary | ICD-10-CM | POA: Diagnosis not present

## 2024-01-16 DIAGNOSIS — S90812A Abrasion, left foot, initial encounter: Secondary | ICD-10-CM | POA: Diagnosis not present

## 2024-01-16 MED ORDER — CHLORHEXIDINE GLUCONATE 4 % EX SOLN: Freq: Every day | CUTANEOUS | 0 refills | Status: AC | PRN

## 2024-01-16 MED ORDER — CEPHALEXIN 500 MG PO CAPS: 500.0000 mg | ORAL_CAPSULE | Freq: Two times a day (BID) | ORAL | 0 refills | Status: DC

## 2024-01-16 MED ORDER — MUPIROCIN 2 % EX OINT: 1.0000 | TOPICAL_OINTMENT | Freq: Two times a day (BID) | CUTANEOUS | 0 refills | Status: AC

## 2024-01-16 NOTE — ED Provider Notes (Signed)
 [facility]    CSN: [id number] Arrival date & time: 01/16/24  1555      History   Chief Complaint Chief Complaint  Patient presents with   sores on bilateral feet    HPI Jacqueline [name] is a 63 y.o. female.   Patient presenting today with scabbed painful areas to bilateral heels following a pedicure 2 days ago.  She states the area started bleeding after they exfoliated her heels and have since been scabbed and red and painful.  Denies bleeding, drainage, fever, chills, numbness, tingling.  So far not trying anything over-the-counter for symptoms.    Past Medical History:  Diagnosis Date   Anemia    Arthritis    per patient, back   Complication of anesthesia 2018   per patient HR dropped during partial hysterectomy surgery   Fibroid uterus    GERD (gastroesophageal reflux disease)    H/O total hysterectomy    Heart murmur    Hyperlipidemia    Borderline   Hypertension    Numbness and tingling in right hand    Pre-diabetes    borderline   Sleep apnea     Patient Active Problem List   Diagnosis Date Noted   Vaginal irritation 11/29/2023   Situational anxiety 11/29/2023   Elevated LFTs 08/26/2023   Viral illness 08/06/2023   Epigastric pain 07/07/2023   S/P arthroscopy of left knee debridement of ACL/ lateral meniscectomy 12/25/22 01/02/2023   Bucket-handle tear of lateral meniscus of left knee as current injury 12/25/2022   Left anterior cruciate ligament tear 12/25/2022   Acute non-recurrent maxillary sinusitis 10/26/2022   Mucopurulent conjunctivitis of both eyes 10/26/2022   Acute bronchitis 10/25/2022   Maxillary sinusitis, acute 10/25/2022   Conjunctivitis 10/25/2022   Neck pain, chronic 08/28/2022   Sleep apnea 08/28/2022   Need for immunization against influenza 02/28/2022   Screening for cervical cancer 12/21/2021   H. pylori infection 12/05/2021   Snoring 11/23/2021   Insomnia 11/23/2021   Annual physical exam 10/16/2021   Dermatitis  10/16/2021   Need for varicella vaccine 10/16/2021   Diarrhea 07/05/2021   Stomach cramps 07/05/2021   Nausea & vomiting 07/05/2021   Encounter for vaccination 07/05/2021   Right foot pain 05/16/2021   Dysphagia 05/01/2021   Right shoulder pain 12/27/2020   Rash 12/13/2020   Chronic back pain 04/04/2020   History of back surgery 04/04/2020   Radiculopathy 07/28/2019   Borderline diabetes 12/25/2018   Hypertension 06/30/2018   S/P abdominal supracervical subtotal hysterectomy 11/06/2016   Trigger middle finger of right hand 04/24/2016   GERD (gastroesophageal reflux disease) 08/04/2014   Shoulder strain 07/26/2014   Degenerative arthritis of thumb 07/26/2014   Left knee pain 02/28/2014   Hip pain 04/15/2013   Epicondylitis, lateral (tennis elbow) 04/13/2013   Constipation 09/15/2012   Hyperlipidemia 11/28/2011   Obesity, Class I, BMI 30.0-34.9 (see actual BMI) 09/15/2011   ALLERGIC RHINITIS, SEASONAL 11/13/2007    Past Surgical History:  Procedure Laterality Date   BACK SURGERY     BALLOON DILATION N/A 05/22/2021   Procedure: BALLOON DILATION;  Surgeon: Cindie Carlin POUR, DO;  Location: [facility];  Service: Endoscopy;  Laterality: N/A;   BIOPSY  11/18/2022   Procedure: BIOPSY;  Surgeon: Shaaron Lamar HERO, MD;  Location: [facility];  Service: Endoscopy;;   CATARACT EXTRACTION W/PHACO Left 06/27/2023   Procedure: CATARACT EXTRACTION PHACO AND INTRAOCULAR LENS PLACEMENT (IOC);  Surgeon: Harrie Agent, MD;  Location: [facility];  Service: Ophthalmology;  Laterality: Left;  CDE: 3.56   CATARACT EXTRACTION W/PHACO Right 07/11/2023   Procedure: CATARACT EXTRACTION PHACO AND INTRAOCULAR LENS PLACEMENT (IOC);  Surgeon: Harrie Agent, MD;  Location: [facility];  Service: Ophthalmology;  Laterality: Right;  CDE: 2.82   CHOLECYSTECTOMY     COLONOSCOPY  2003   Dr. Shaaron: Anal canal hemorrhoids   COLONOSCOPY N/A 08/25/2014   Normal colonoscopy.  Next colonoscopy in 2026.  Procedure:  COLONOSCOPY;  Surgeon: Lamar CHRISTELLA Shaaron, MD;  [facility]: [facility];  Service: Endoscopy;  Laterality: N/A;  1200   ESOPHAGOGASTRODUODENOSCOPY N/A 08/25/2014   Procedure: ESOPHAGOGASTRODUODENOSCOPY (EGD);  Surgeon: Lamar CHRISTELLA Shaaron, MD;  [facility]: [facility];  Service: Endoscopy;  Laterality: N/A;   ESOPHAGOGASTRODUODENOSCOPY (EGD) WITH PROPOFOL  N/A 05/22/2021   Web and distal esophagus, dilated, gastriti, biopsy positive for mildly active H. pylori gastritis.  s procedure: ESOPHAGOGASTRODUODENOSCOPY (EGD) WITH PROPOFOL ;  Surgeon: Cindie Carlin POUR, DO;  [facility]: [facility];  Service: Endoscopy;  Laterality: N/A;  9:15am   ESOPHAGOGASTRODUODENOSCOPY (EGD) WITH PROPOFOL  N/A 11/18/2022   Procedure: ESOPHAGOGASTRODUODENOSCOPY (EGD) WITH PROPOFOL ;  Surgeon: Shaaron Lamar CHRISTELLA, MD;  [facility]: [facility];  Service: Endoscopy;  Laterality: N/A;  2:15 pm, asa 2   KNEE ARTHROSCOPY WITH MEDIAL MENISECTOMY Left 12/25/2022   Procedure: KNEE ARTHROSCOPY WITH PARTIAL LATERAL MENISCECTOMY DEBRIDEMENT ACL;  Surgeon: Margrette Taft BRAVO, MD;  Location: [facility];  Service: Orthopedics;  Laterality: Left;   MALONEY DILATION N/A 11/18/2022   Procedure: AGAPITO DILATION;  Surgeon: Shaaron Lamar CHRISTELLA, MD;  [facility]: [facility];  Service: Endoscopy;  Laterality: N/A;   SALPINGOOPHORECTOMY Bilateral 11/06/2016   Procedure: BILATERAL SALPINGO OOPHORECTOMY;  Surgeon: Jayne Vonn DEL, MD;  Location: [facility];  Service: Gynecology;  Laterality: Bilateral;   SUPRACERVICAL ABDOMINAL HYSTERECTOMY N/A 11/06/2016   Procedure: HYSTERECTOMY SUPRACERVICAL ABDOMINAL;  Surgeon: Jayne Vonn DEL, MD;  Location: [facility];  Service: Gynecology;  Laterality: N/A;   TRANSFORAMINAL LUMBAR INTERBODY FUSION (TLIF) WITH PEDICLE SCREW FIXATION 2 LEVEL Left 07/28/2019   Procedure: LEFT-SIDED LUMBAR 4- 5, LUMBAR 5 -SACRUM1 TRANSFORAMINAL LUMBAR INTERBODY FUSION WITH INSTRUMENTATION AND ALLOGRAFT;  Surgeon: Beuford Anes, MD;  Location: [facility];  Service:  Orthopedics;  Laterality: Left;   TUBAL LIGATION      OB History     Gravida  3   Para  3   Term      Preterm      AB      Living  3      SAB      IAB      Ectopic      Multiple      Live Births               Home Medications    Prior to Admission medications   Medication Sig Start Date End Date Taking? Authorizing Provider  cephALEXin  (KEFLEX ) 500 MG capsule Take 1 capsule (500 mg total) by mouth 2 (two) times daily. 01/16/24  Yes Stuart Vernell Norris, PA-C  chlorhexidine  (HIBICLENS ) 4 % external liquid Apply topically daily as needed. 01/16/24  Yes Stuart Vernell Norris, PA-C  mupirocin  ointment (BACTROBAN ) 2 % Apply 1 Application topically 2 (two) times daily. 01/16/24  Yes Stuart Vernell Norris, PA-C  acyclovir  (ZOVIRAX ) 400 MG tablet TAKE 1 TABLET (400 MG TOTAL) BY MOUTH DAILY AS NEEDED (OUTBREAKS) 08/21/23   Zarwolo, Gloria, FNP  albuterol  (VENTOLIN  HFA) 108 (90 Base) MCG/ACT inhaler Inhale 2 puffs into the lungs every 6 (six) hours  as needed for wheezing or shortness of breath. 08/06/23   Del Wilhelmena Falter, Hilario, FNP  amLODipine  (NORVASC ) 5 MG tablet Take 1 tablet (5 mg total) by mouth daily. 11/27/23   Bevely Doffing, FNP  azelastine  (ASTELIN ) 0.1 % nasal spray Place 1 spray into both nostrils 2 (two) times daily as needed for rhinitis. Use in each nostril as directed    [provider]  conjugated estrogens  (PREMARIN ) vaginal cream Place 1 Applicatorful vaginally daily. Use 1 gram nightly 10/07/23   Jayne Vonn DEL, MD  diazepam  (VALIUM ) 10 MG tablet Take 1 tablet (10 mg total) by mouth every 12 (twelve) hours as needed for anxiety (take 30 minutes prior to procedure.). 11/27/23   Bevely Doffing, FNP  diclofenac  (VOLTAREN ) 75 MG EC tablet TAKE 1 TABLET BY MOUTH TWICE DAILY WITH A MEAL 11/10/23   Brenna Lin, MD  diclofenac  Sodium (VOLTAREN ) 1 % GEL Apply 4 g topically 4 (four) times daily. To knee 03/07/23   Margrette Taft BRAVO, MD  DULoxetine  (CYMBALTA )  30 MG capsule Take 1 capsule (30 mg total) by mouth daily. 11/27/23   Bevely Doffing, FNP  estradiol  (ESTRACE ) 0.1 MG/GM vaginal cream INSERT 2 GRAMS VAGINALLY AS DIRECTED EVERY OTHER NIGHT 06/06/23   Jayne Vonn DEL, MD  ezetimibe  (ZETIA ) 10 MG tablet Take 1 tablet (10 mg total) by mouth daily. 03/11/23   Zarwolo, Gloria, FNP  hydrocortisone  (ANUSOL -HC) 2.5 % rectal cream Place 1 Application rectally 2 (two) times daily. For hemorrhoids 08/26/23   Shirlean Therisa ORN, NP  ibuprofen  (ADVIL ) 800 MG tablet Take 1 tablet (800 mg total) by mouth every 8 (eight) hours as needed. 12/25/22   Margrette Taft BRAVO, MD  levocetirizine (XYZAL ) 5 MG tablet TAKE 1 TABLET BY MOUTH ONCE DAILY IN THE EVENING 12/22/23   Zarwolo, Gloria, FNP  methocarbamol  (ROBAXIN ) 750 MG tablet Take 1 tablet (750 mg total) by mouth every 8 (eight) hours as needed for muscle spasms (pain). 11/27/23   Bevely Doffing, FNP  Multiple Vitamin (MULTIVITAMIN WITH MINERALS) TABS tablet Take 1 tablet by mouth daily with supper. One-A-Day Multivitamin    [provider]  pantoprazole  (PROTONIX ) 40 MG tablet TAKE 1 TABLET BY MOUTH TWICE DAILY BEFORE A MEAL 01/07/24   Shirlean Therisa ORN, NP  traZODone  (DESYREL ) 50 MG tablet Take 1 tablet (50 mg total) by mouth at bedtime as needed for sleep. 11/27/23   Bevely Doffing, FNP    Family History Family History  Problem Relation Age of Onset   Breast cancer Maternal Aunt    Hypertension Maternal Aunt    Cancer Maternal Aunt        Breast   Cancer Maternal Grandmother         lymphnodes   Colon cancer Neg Hx     Social History Social History   Tobacco Use   Smoking status: Never   Smokeless tobacco: Never   Tobacco comments:    Never smoked  Vaping Use   Vaping status: Never Used  Substance Use Topics   Alcohol use: No    Alcohol/week: 0.0 standard drinks of alcohol   Drug use: No     Allergies   Patient has no known allergies.   Review of Systems Review of Systems Per HPI  Physical  Exam Triage Vital Signs ED Triage Vitals  Encounter Vitals Group     BP 01/16/24 1618 121/76     Girls Systolic BP Percentile --      Girls Diastolic BP Percentile --  Boys Systolic BP Percentile --      Boys Diastolic BP Percentile --      Pulse Rate 01/16/24 1618 76     Resp 01/16/24 1618 18     Temp 01/16/24 1618 98.5 F (36.9 C)     Temp Source 01/16/24 1618 Oral     SpO2 01/16/24 1618 96 %     Weight --      Height --      Head Circumference --      Peak Flow --      Pain Score 01/16/24 1620 4     Pain Loc --      Pain Education --      Exclude from Growth Chart --    No data found.  Updated Vital Signs BP 121/76 (BP Location: Right Arm)   Pulse 76   Temp 98.5 F (36.9 C) (Oral)   Resp 18   LMP 10/26/2016 (Exact Date)   SpO2 96%   Visual Acuity Right Eye Distance:   Left Eye Distance:   Bilateral Distance:    Right Eye Near:   Left Eye Near:    Bilateral Near:     Physical Exam Vitals and nursing note reviewed.  Constitutional:      Appearance: Normal appearance. She is not ill-appearing.  HENT:     Head: Atraumatic.  Eyes:     Extraocular Movements: Extraocular movements intact.     Conjunctiva/sclera: Conjunctivae normal.  Cardiovascular:     Rate and Rhythm: Normal rate.  Pulmonary:     Effort: Pulmonary effort is normal.  Musculoskeletal:        General: Normal range of motion.     Cervical back: Normal range of motion and neck supple.  Skin:    General: Skin is warm and dry.     Comments: Small areas of superficial scabbed abrasions to bilateral medial heels, no significant surrounding erythema, edema, and no fluctuance or induration, no active bleeding or drainage  Neurological:     Mental Status: She is alert and oriented to person, place, and time.     Comments: Bilateral lower extremities neurovascularly intact  Psychiatric:        Mood and Affect: Mood normal.        Thought Content: Thought content normal.        Judgment:  Judgment normal.      UC Treatments / Results  Labs (all labs ordered are listed, but only abnormal results are displayed) Labs Reviewed - No data to display  EKG   Radiology No results found.  Procedures Procedures (including critical care time)  Medications Ordered in UC Medications - No data to display  Initial Impression / Assessment and Plan / UC Course  I have reviewed the triage vital signs and the nursing notes.  Pertinent labs & imaging results that were available during my care of the patient were reviewed by me and considered in my medical decision making (see chart for details).     Suspect topical care will be sufficient with Hibiclens , mupirocin , leg elevation to help with any chronic swelling in the legs but Keflex  sent in case significantly worsening over the next few days.  Return if not resolving  Final Clinical Impressions(s) / UC Diagnoses   Final diagnoses:  Foot abrasion, left, initial encounter  Abrasion of right foot, initial encounter     Discharge Instructions      I have prescribed Hibiclens  solution and mupirocin  ointment and recommend that  you clean the areas once to twice daily and apply the ointment and a nonstick dressing.  Elevate your legs at rest to help with swelling, compression stockings if able.  If your symptoms are progressively worsening you may start the antibiotic Keflex  that I have sent over.  I am very hopeful that you will not need this and that things will improve with good topical care.    ED Prescriptions     Medication Sig Dispense Auth. Provider   chlorhexidine  (HIBICLENS ) 4 % external liquid Apply topically daily as needed. 236 mL Stuart Vernell Norris, PA-C   mupirocin  ointment (BACTROBAN ) 2 % Apply 1 Application topically 2 (two) times daily. 60 g Stuart Vernell Norris, PA-C   cephALEXin  (KEFLEX ) 500 MG capsule Take 1 capsule (500 mg total) by mouth 2 (two) times daily. 14 capsule Stuart Vernell Norris, [place]       PDMP not reviewed this encounter.   Stuart Vernell Norris, [place] 01/16/24 1652

## 2024-01-16 NOTE — Telephone Encounter (Signed)
 FYI Only or Action Required?: FYI only for provider.  Patient was last seen in primary care on 11/27/2023 by Bevely Doffing, FNP.  Called Nurse Triage reporting Joint Swelling (Open wounds on feet).  Symptoms began several days ago.  Interventions attempted: OTC medications: antibiotic ointment on feet, elevation.  Symptoms are: unchanged.  Triage Disposition: See PCP When Office is Open (Within 3 Days)  Patient/caregiver understands and will follow disposition?: Yes    Reason for Disposition  [1] MILD swelling of both ankles (e.g., ankle joints look swollen; or bilateral mild pedal edema) AND [2] new-onset or getting worse  (Exceptions: Caused by hot weather, already seen by doctor or NP/PA for this.)  Answer Assessment - Initial Assessment Questions 1. LOCATION: Which ankle is swollen? Where is the swelling?     Bilateral ankle swelling 2. ONSET: When did the swelling start?     4 days ago 3. SWELLING: How bad is the swelling? Or, How large is it? (e.g., mild, moderate, severe; size of localized swelling)      No hx ankle swelling, moderate- localized 4. PAIN: Is there any pain? If Yes, ask: How bad is it? (Scale 0-10; or none, mild, moderate, severe)     no 5. CAUSE: What do you think caused the ankle swelling?     Unsure- does not go away 6. OTHER SYMPTOMS: Do you have any other symptoms? (e.g., fever, chest pain, difficulty breathing, calf pain)     Sore on heel- left(3) and right(2)- dry skin removed- at nail salon- Wednesday pm  Protocols used: Ankle Swelling-A-AH  Copied from CRM [id number]. Topic: Clinical - Red Word Triage >> Jan 16, 2024  1:23 PM Rosaria BRAVO wrote: Red Word that prompted transfer to Nurse Triage: Pt has swollen ankles, and has open wounds on her feet from the nail salon (diabetic) very concerned    ----------------------------------------------------------------------- From previous Reason for Contact - Scheduling: Patient/patient  representative is calling to schedule an appointment. Refer to attachments for appointment information.

## 2024-01-16 NOTE — ED Triage Notes (Addendum)
 Dry, cracked places on both feet since Wednesday.  States she noticed the places when getting a pedicure.

## 2024-01-16 NOTE — Discharge Instructions (Signed)
 I have prescribed Hibiclens  solution and mupirocin  ointment and recommend that you clean the areas once to twice daily and apply the ointment and a nonstick dressing.  Elevate your legs at rest to help with swelling, compression stockings if able.  If your symptoms are progressively worsening you may start the antibiotic Keflex  that I have sent over.  I am very hopeful that you will not need this and that things will improve with good topical care.

## 2024-01-16 NOTE — Telephone Encounter (Signed)
Patient advised to go to Urgent Care

## 2024-01-20 DIAGNOSIS — Z419 Encounter for procedure for purposes other than remedying health state, unspecified: Secondary | ICD-10-CM | POA: Diagnosis not present

## 2024-01-21 ENCOUNTER — Ambulatory Visit: Admitting: Orthopaedic Surgery

## 2024-01-21 ENCOUNTER — Encounter: Payer: Self-pay | Admitting: Orthopaedic Surgery

## 2024-01-21 DIAGNOSIS — G8929 Other chronic pain: Secondary | ICD-10-CM | POA: Diagnosis not present

## 2024-01-21 DIAGNOSIS — M25511 Pain in right shoulder: Secondary | ICD-10-CM

## 2024-01-21 DIAGNOSIS — M542 Cervicalgia: Secondary | ICD-10-CM

## 2024-01-21 DIAGNOSIS — M5417 Radiculopathy, lumbosacral region: Secondary | ICD-10-CM | POA: Insufficient documentation

## 2024-01-21 MED ORDER — METHYLPREDNISOLONE ACETATE 40 MG/ML IJ SUSP
40.0000 mg | Freq: Once | INTRAMUSCULAR | Status: AC
  Administered 2024-01-21 (×2): 40 mg via INTRA_ARTICULAR

## 2024-01-21 NOTE — Progress Notes (Signed)
 She has not been able to get approval for MRI of neck.  Her right shoulder is more painful.  PROCEDURE NOTE:  The patient request injection, verbal consent was obtained.  The right shoulder was prepped appropriately after time out was performed.   Sterile technique was observed and injection of 1 cc of DepoMedrol 40mg  with several cc's of plain xylocaine . Anesthesia was provided by ethyl chloride and a 20-gauge needle was used to inject the shoulder area. A posterior approach was used.  The injection was tolerated well.  A band aid dressing was applied.  The patient was advised to apply ice later today and tomorrow to the injection sight as needed.  Encounter Diagnoses  Name Primary?   Chronic right shoulder pain Yes   Cervicalgia    Try to find out about MRI.  See prn.  Call if any problem.  Precautions discussed.  Electronically Signed Lemond Stable, MD 8/13/20253:24 PM

## 2024-01-21 NOTE — Addendum Note (Signed)
 Addended by: MARCINE HUSBAND T on: 01/21/2024 04:55 PM   Modules accepted: Orders

## 2024-01-30 ENCOUNTER — Encounter: Payer: Self-pay | Admitting: Radiology

## 2024-02-04 NOTE — Therapy (Unsigned)
 OUTPATIENT PHYSICAL THERAPY CERVICAL EVALUATION   Patient Name: Jacqueline [name] MRN: [id number] DOB:Nov 01, 1960, 63 y.o., female Today's Date: 02/05/2024  END OF SESSION:  PT End of Session - 02/05/24 1351     Visit Number 1    Number of Visits 12    Date for PT Re-Evaluation 03/04/24    Authorization Type [place] Medicaid [employer]    Authorization Time Period Auth requested on 02/05/24    Progress Note Due on Visit 10    PT Start Time 1352   pt late arrival   PT Stop Time 1430    PT Time Calculation (min) 38 min    Activity Tolerance Patient tolerated treatment well;Patient limited by pain    Behavior During Therapy [facility] for tasks assessed/performed          Past Medical History:  Diagnosis Date   Anemia    Arthritis    per patient, back   Complication of anesthesia 2018   per patient HR dropped during partial hysterectomy surgery   Fibroid uterus    GERD (gastroesophageal reflux disease)    H/O total hysterectomy    Heart murmur    Hyperlipidemia    Borderline   Hypertension    Numbness and tingling in right hand    Pre-diabetes    borderline   Sleep apnea    Past Surgical History:  Procedure Laterality Date   BACK SURGERY     BALLOON DILATION N/A 05/22/2021   Procedure: BALLOON DILATION;  Surgeon: Cindie Carlin POUR, DO;  [facility];  Service: Endoscopy;  Laterality: N/A;   BIOPSY  11/18/2022   Procedure: BIOPSY;  Surgeon: Shaaron Lamar HERO, MD;  [facility];  Service: Endoscopy;;   CATARACT EXTRACTION W/PHACO Left 06/27/2023   Procedure: CATARACT EXTRACTION PHACO AND INTRAOCULAR LENS PLACEMENT (IOC);  Surgeon: Harrie Agent, MD;  Location: [facility];  Service: Ophthalmology;  Laterality: Left;  CDE: 3.56   CATARACT EXTRACTION W/PHACO Right 07/11/2023   Procedure: CATARACT EXTRACTION PHACO AND INTRAOCULAR LENS PLACEMENT (IOC);  Surgeon: Harrie Agent, MD;  Location: [facility];  Service: Ophthalmology;  Laterality: Right;  CDE: 2.82   CHOLECYSTECTOMY      COLONOSCOPY  2003   Dr. Shaaron: Anal canal hemorrhoids   COLONOSCOPY N/A 08/25/2014   Normal colonoscopy.  Next colonoscopy in 2026.  Procedure: COLONOSCOPY;  Surgeon: Lamar HERO Shaaron, MD;  [facility];  Service: Endoscopy;  Laterality: N/A;  1200   ESOPHAGOGASTRODUODENOSCOPY N/A 08/25/2014   Procedure: ESOPHAGOGASTRODUODENOSCOPY (EGD);  Surgeon: Lamar HERO Shaaron, MD;  [facility];  Service: Endoscopy;  Laterality: N/A;   ESOPHAGOGASTRODUODENOSCOPY (EGD) WITH PROPOFOL  N/A 05/22/2021   Web and distal esophagus, dilated, gastriti, biopsy positive for mildly active H. pylori gastritis.  s procedure: ESOPHAGOGASTRODUODENOSCOPY (EGD) WITH PROPOFOL ;  Surgeon: Cindie Carlin POUR, DO;  [facility];  Service: Endoscopy;  Laterality: N/A;  9:15am   ESOPHAGOGASTRODUODENOSCOPY (EGD) WITH PROPOFOL  N/A 11/18/2022   Procedure: ESOPHAGOGASTRODUODENOSCOPY (EGD) WITH PROPOFOL ;  Surgeon: Shaaron Lamar HERO, MD;  [facility];  Service: Endoscopy;  Laterality: N/A;  2:15 pm, asa 2   KNEE ARTHROSCOPY WITH MEDIAL MENISECTOMY Left 12/25/2022   Procedure: KNEE ARTHROSCOPY WITH PARTIAL LATERAL MENISCECTOMY DEBRIDEMENT ACL;  Surgeon: Margrette Taft BRAVO, MD;  Location: [facility];  Service: Orthopedics;  Laterality: Left;   MALONEY DILATION N/A 11/18/2022   Procedure: AGAPITO DILATION;  Surgeon: Shaaron Lamar HERO, MD;  [facility];  Service: Endoscopy;  Laterality: N/A;  SALPINGOOPHORECTOMY Bilateral 11/06/2016   Procedure: BILATERAL SALPINGO OOPHORECTOMY;  Surgeon: Jayne Vonn DEL, MD;  Location: [facility];  Service: Gynecology;  Laterality: Bilateral;   SUPRACERVICAL ABDOMINAL HYSTERECTOMY N/A 11/06/2016   Procedure: HYSTERECTOMY SUPRACERVICAL ABDOMINAL;  Surgeon: Jayne Vonn DEL, MD;  Location: [facility];  Service: Gynecology;  Laterality: N/A;   TRANSFORAMINAL LUMBAR INTERBODY FUSION (TLIF) WITH PEDICLE SCREW FIXATION 2 LEVEL Left 07/28/2019   Procedure: LEFT-SIDED LUMBAR 4- 5,  LUMBAR 5 -SACRUM1 TRANSFORAMINAL LUMBAR INTERBODY FUSION WITH INSTRUMENTATION AND ALLOGRAFT;  Surgeon: Beuford Anes, MD;  Location: [facility];  Service: Orthopedics;  Laterality: Left;   TUBAL LIGATION     Patient Active Problem List   Diagnosis Date Noted   Lumbosacral radiculopathy 01/21/2024   Vaginal irritation 11/29/2023   Situational anxiety 11/29/2023   Elevated LFTs 08/26/2023   Viral illness 08/06/2023   Epigastric pain 07/07/2023   S/P arthroscopy of left knee debridement of ACL/ lateral meniscectomy 12/25/22 01/02/2023   Bucket-handle tear of lateral meniscus of left knee as current injury 12/25/2022   Left anterior cruciate ligament tear 12/25/2022   Acute non-recurrent maxillary sinusitis 10/26/2022   Mucopurulent conjunctivitis of both eyes 10/26/2022   Acute bronchitis 10/25/2022   Maxillary sinusitis, acute 10/25/2022   Conjunctivitis 10/25/2022   Neck pain, chronic 08/28/2022   Sleep apnea 08/28/2022   Need for immunization against influenza 02/28/2022   Screening for cervical cancer 12/21/2021   H. pylori infection 12/05/2021   Snoring 11/23/2021   Insomnia 11/23/2021   Annual physical exam 10/16/2021   Dermatitis 10/16/2021   Need for varicella vaccine 10/16/2021   Diarrhea 07/05/2021   Stomach cramps 07/05/2021   Nausea & vomiting 07/05/2021   Encounter for vaccination 07/05/2021   Right foot pain 05/16/2021   Dysphagia 05/01/2021   Right shoulder pain 12/27/2020   Rash 12/13/2020   Chronic back pain 04/04/2020   History of back surgery 04/04/2020   Radiculopathy 07/28/2019   Borderline diabetes 12/25/2018   Hypertension 06/30/2018   S/P abdominal supracervical subtotal hysterectomy 11/06/2016   Trigger middle finger of right hand 04/24/2016   GERD (gastroesophageal reflux disease) 08/04/2014   Shoulder strain 07/26/2014   Degenerative arthritis of thumb 07/26/2014   Left knee pain 02/28/2014   Hip pain 04/15/2013   Epicondylitis, lateral (tennis  elbow) 04/13/2013   Constipation 09/15/2012   Hyperlipidemia 11/28/2011   Obesity, Class I, BMI 30.0-34.9 (see actual BMI) 09/15/2011   ALLERGIC RHINITIS, SEASONAL 11/13/2007    PCP: Zarwolo, Gloria, FNP  REFERRING PROVIDER: Beuford Anes, MD  REFERRING DIAG: neck and low back pain  THERAPY DIAG:  Other low back pain  Neck pain  Impaired functional mobility, balance, gait, and endurance  Muscle weakness (generalized)  Rationale for Evaluation and Treatment: Rehabilitation  ONSET DATE: Chronic - since 2017  SUBJECTIVE:  SUBJECTIVE STATEMENT: Pt reports neck pain travels down into her shoulders and upper arms. Reports low back pain is a constant pain. (See below for pain description). Reports they want to do a MRI but wanted her to do PT first. Had epidural about 2 months ago into low back with no improvement. Reports she also got one into R shoulder with no improvement.  Hand dominance: Right  PERTINENT HISTORY:  Back surgery in 2021 Previous L menisectomy Previous L ACL tear   PAIN:  Are you having pain? Yes: NPRS scale: 6/10 neck, 8/10 low back Pain location: neck, shoulders, upper arm, low back travels into LE   Pain description: Neck- catch, shoulders-ache, low back- bad ache, LE-burning sensation  Aggravating factors: neck - twisting, back- all movement Relieving factors: No relief   PRECAUTIONS: None  RED FLAGS: None     WEIGHT BEARING RESTRICTIONS: No  FALLS:  Has patient fallen in last 6 months? No  LIVING ENVIRONMENT: Stairs: Yes: External: 4-5 steps; can reach both Has following equipment at home: has AD but does not use currently  OCCUPATION: Cashier, part-time   PLOF: Independent  PATIENT GOALS: To get better  NEXT MD VISIT: September 3rd,  2025  OBJECTIVE:  Note: Objective measures were completed at Evaluation unless otherwise noted.  DIAGNOSTIC FINDINGS:   There is some loss of cervical lordosis.  There are mild degenerative changes of base of C3 thru C5.  No fracture is noted.  Bone quality is good.   Impression:  mild degenerative changes of cervical spine, no acute findings.  PATIENT SURVEYS:  Modified Oswestry: Modified Oswestry Low Back Pain Disability Questionnaire: 28 / 50 = 56.0 %  Interpretation of scores: Score Category Description  0-20% Minimal Disability The patient can cope with most living activities. Usually no treatment is indicated apart from advice on lifting, sitting and exercise  21-40% Moderate Disability The patient experiences more pain and difficulty with sitting, lifting and standing. Travel and social life are more difficult and they may be disabled from work. Personal care, sexual activity and sleeping are not grossly affected, and the patient can usually be managed by conservative means  41-60% Severe Disability Pain remains the main problem in this group, but activities of daily living are affected. These patients require a detailed investigation  61-80% Crippled Back pain impinges on all aspects of the patient's life. Positive intervention is required  81-100% Bed-bound  These patients are either bed-bound or exaggerating their symptoms  Bluford FORBES Zoe DELENA Karon DELENA, et al. Surgery versus conservative management of stable thoracolumbar fracture: the PRESTO feasibility RCT. [place]): [employer]; 2021 Nov. [facility] Technology Assessment, No. 25.62.) Appendix 3, Oswestry Disability Index category descriptors. Available from: FindJewelers.cz  Minimally Clinically Important Difference (MCID) = 12.8%   NDI: Neck Disability Index score: 24 / 50 = 48.0 %   Minimum Detectable Change (90% confidence): 5 points or 10% points  COGNITION: Overall  cognitive status: Within functional limits for tasks assessed  SENSATION: Not tested  POSTURE: rounded shoulders, forward head, and increased thoracic kyphosis  PALPATION: N/A   CERVICAL ROM:   Active ROM A/PROM (deg) eval  Flexion 22  Extension 25  Right lateral flexion   Left lateral flexion   Right rotation 45  Left rotation 45   (Blank rows = not tested)  UPPER EXTREMITY ROM:  Active ROM Right eval Left eval  Shoulder flexion 133 133  Shoulder extension    Shoulder abduction    Shoulder adduction  Shoulder extension    Shoulder internal rotation    Shoulder external rotation    Elbow flexion    Elbow extension    Wrist flexion    Wrist extension    Wrist ulnar deviation    Wrist radial deviation    Wrist pronation    Wrist supination     (Blank rows = not tested)  UPPER EXTREMITY MMT:  MMT Right eval Left eval  Shoulder flexion 4- 4-  Shoulder extension    Shoulder abduction    Shoulder adduction    Shoulder extension    Shoulder internal rotation    Shoulder external rotation    Middle trapezius    Lower trapezius    Elbow flexion    Elbow extension    Wrist flexion    Wrist extension    Wrist ulnar deviation    Wrist radial deviation    Wrist pronation    Wrist supination    Grip strength     (Blank rows = not tested)  LUMBAR ROM:   AROM eval  Flexion 10% avail, *  Extension 5% avail, barely any movement *  Right lateral flexion   Left lateral flexion   Right rotation   Left rotation    (Blank rows = not tested)   *=painful  LOWER EXTREMITY MMT:    MMT Right eval Left eval  Hip flexion    Hip extension    Hip abduction    Hip adduction    Hip internal rotation    Hip external rotation    Knee flexion    Knee extension    Ankle dorsiflexion    Ankle plantarflexion    Ankle inversion    Ankle eversion     (Blank rows = not tested)  CERVICAL SPECIAL TESTS:  None tested this date  FUNCTIONAL TESTS:  30 seconds  chair stand test 2 minute walk test: Next Session  TREATMENT DATE:  02/05/24: PT Eval and HEP                                                                                                                                 PATIENT EDUCATION:  Education details: PT evaluation, objective findings, POC, Importance of HEP, Precautions, Clinic policies  Person educated: Patient Education method: Explanation and Demonstration Education comprehension: verbalized understanding and returned demonstration  HOME EXERCISE PROGRAM: Access Code: [email or web address] URL: [email or web address] Date: 02/05/2024 Prepared by: Rosaria Powell-Butler  Exercises - Seated Assisted Cervical Rotation with Towel  - 2 x daily - 7 x weekly - 3 sets - 10 reps - Supine Posterior Pelvic Tilt  - 2 x daily - 7 x weekly - 3 sets - 10 reps - Supine Lower Trunk Rotation  - 2 x daily - 7 x weekly - 3 sets - 10 reps - Supine Cervical Flexion Extension on Pillow  - 2 x daily - 7 x weekly - 3 sets - 10 rep  ASSESSMENT:  CLINICAL IMPRESSION: Patient is a 63 y.o. female who was seen today for physical therapy evaluation and treatment for neck and low back pain. On this date, patient demonstrates decreased self perceived function via NDI and Modified Oswestry, decreased cervical ROM, decreased lumbar ROM, poor posture, difficulty with functional transfers, and increased pain with movement. Throughout session, patient must change positions due to discomfort, and with functional testing, pt knees demo buckling at times due to increased catching pain with lumbar motions. Patient will benefit from skilled physical therapy in order to address the above deficits to improve current level of function and quality of life.    OBJECTIVE IMPAIRMENTS: Abnormal gait, decreased activity tolerance, decreased balance, decreased endurance, decreased mobility, difficulty walking, decreased ROM, decreased strength, impaired UE functional use,  improper body mechanics, postural dysfunction, and pain.   ACTIVITY LIMITATIONS: carrying, lifting, bending, sitting, standing, squatting, stairs, transfers, bed mobility, and Driving  PARTICIPATION LIMITATIONS: meal prep, cleaning, laundry, driving, community activity, occupation, and yard work  PERSONAL FACTORS: N/A are also affecting patient's functional outcome.   REHAB POTENTIAL: Good  CLINICAL DECISION MAKING: Stable/uncomplicated  EVALUATION COMPLEXITY: Low   GOALS: Goals reviewed with patient? No  SHORT TERM GOALS: Target date: 02/26/24 Patient will be independent with performance of HEP to demonstrate adequate self management of symptoms.  Baseline:  Goal status: INITIAL  2.   Patient will report at least a 25% improvement with function and/or pain reduction overall since beginning PT. Baseline:  Goal status: INITIAL    LONG TERM GOALS: Target date: 04/01/24  Patient will improve Modified Oswestry score by at least 12.8 % in order to demonstrate improved self perceived function while meeting MCID. Baseline:  Goal status: INITIAL  2.  Patient will improve Neck Disability Index score by at least 5 points in order to demonstrate improved self perceived function while meeting MCID. Baseline:  Goal status: INITIAL  3.  Patient will improve lumbar flexion and extension to achieve at least 25% of available motion in order to demonstrate improved lumabr mobility needed for functional ADLs and iADLs.  Baseline:  Goal status: INITIAL  4.  Patient will improve cervical rotation to at least 60 degrees bilaterally in order to safely perform functional tasks like driving.  Baseline:  Goal status: INITIAL  5.  Patient will increase 2 minute walk test by at least 40 ft in order to demonstrate improved activity tolerance.  Baseline:  Goal status: INITIAL    PLAN:  PT FREQUENCY: 2x/week  PT DURATION: 8 weeks  PLANNED INTERVENTIONS: 97164- PT Re-evaluation,  97110-Therapeutic exercises, 97530- Therapeutic activity, 97112- Neuromuscular re-education, 97535- Self Care, 02859- Manual therapy, [phone]- Gait training, [phone]- Electrical stimulation (manual), [phone]- Traction (mechanical), [phone] (1-2 muscles), [phone] (3+ muscles)- Dry Needling, Patient/Family education, Balance training, Stair training, Taping, Joint mobilization, Spinal mobilization, Cryotherapy, and Moist heat  PLAN FOR NEXT SESSION: Review HEP and goals, assess UE MMT, assess LE MMT, test, 30 second chair stand test, progress lumbar mobility, progress cervical mobility, general strengthening (core, LE, etc.)   4:31 PM, Feb 26, 2024 Jerre Diguglielmo Powell-Butler, PT, DPT [facility]    Managed Medicaid Authorization Request Treatment Start Date: 02-26-24  Visit Dx Codes:  M54.59 M54.2 Z74.09 M62.81  Functional Tool Score:  NDI: Neck Disability Index score: 24 / 50 = 48.0 %  Modified Oswestry Low Back Pain Disability Questionnaire: 28 / 50 = 56.0 %  For all possible CPT codes, reference the Planned Interventions line above.  Check all conditions that are expected to impact treatment: {Conditions expected to impact treatment:None of these apply   If treatment provided at initial evaluation, no treatment charged due to lack of authorization.

## 2024-02-05 ENCOUNTER — Ambulatory Visit (HOSPITAL_COMMUNITY): Attending: Orthopedic Surgery

## 2024-02-05 ENCOUNTER — Other Ambulatory Visit: Payer: Self-pay

## 2024-02-05 ENCOUNTER — Encounter (HOSPITAL_COMMUNITY): Payer: Self-pay

## 2024-02-05 DIAGNOSIS — M6281 Muscle weakness (generalized): Secondary | ICD-10-CM | POA: Diagnosis not present

## 2024-02-05 DIAGNOSIS — M542 Cervicalgia: Secondary | ICD-10-CM | POA: Insufficient documentation

## 2024-02-05 DIAGNOSIS — Z7409 Other reduced mobility: Secondary | ICD-10-CM | POA: Insufficient documentation

## 2024-02-05 DIAGNOSIS — M5459 Other low back pain: Secondary | ICD-10-CM | POA: Diagnosis not present

## 2024-02-10 ENCOUNTER — Other Ambulatory Visit (HOSPITAL_COMMUNITY): Payer: Self-pay | Admitting: Family Medicine

## 2024-02-10 DIAGNOSIS — G8929 Other chronic pain: Secondary | ICD-10-CM | POA: Diagnosis not present

## 2024-02-10 DIAGNOSIS — Z79891 Long term (current) use of opiate analgesic: Secondary | ICD-10-CM | POA: Diagnosis not present

## 2024-02-10 DIAGNOSIS — M48061 Spinal stenosis, lumbar region without neurogenic claudication: Secondary | ICD-10-CM | POA: Diagnosis not present

## 2024-02-10 DIAGNOSIS — M961 Postlaminectomy syndrome, not elsewhere classified: Secondary | ICD-10-CM | POA: Diagnosis not present

## 2024-02-10 DIAGNOSIS — Z1231 Encounter for screening mammogram for malignant neoplasm of breast: Secondary | ICD-10-CM

## 2024-02-10 DIAGNOSIS — M5416 Radiculopathy, lumbar region: Secondary | ICD-10-CM | POA: Diagnosis not present

## 2024-02-11 ENCOUNTER — Ambulatory Visit (HOSPITAL_COMMUNITY): Attending: Orthopedic Surgery

## 2024-02-11 DIAGNOSIS — Z7409 Other reduced mobility: Secondary | ICD-10-CM | POA: Diagnosis not present

## 2024-02-11 DIAGNOSIS — M6281 Muscle weakness (generalized): Secondary | ICD-10-CM | POA: Diagnosis not present

## 2024-02-11 DIAGNOSIS — M5459 Other low back pain: Secondary | ICD-10-CM | POA: Insufficient documentation

## 2024-02-11 DIAGNOSIS — M542 Cervicalgia: Secondary | ICD-10-CM | POA: Insufficient documentation

## 2024-02-11 NOTE — Therapy (Signed)
 OUTPATIENT PHYSICAL THERAPY CERVICAL/LUMBAR TREATMENT   Patient Name: Jacqueline [name] MRN: [id number] DOB:[birth year], 63 y.o., female Today's Date: 02/11/2024  END OF SESSION:  PT End of Session - 02/11/24 1115     Visit Number 2    Number of Visits 12    Date for PT Re-Evaluation 03/04/24    Authorization Type [place] Medicaid [employer]    Authorization Time Period Auth requested on 02/05/24    Progress Note Due on Visit 10    PT Start Time 1100    PT Stop Time 1143    PT Time Calculation (min) 43 min    Activity Tolerance Patient tolerated treatment well;Patient limited by pain    Behavior During Therapy [facility] for tasks assessed/performed           Past Medical History:  Diagnosis Date   Anemia    Arthritis    per patient, back   Complication of anesthesia 2018   per patient HR dropped during partial hysterectomy surgery   Fibroid uterus    GERD (gastroesophageal reflux disease)    H/O total hysterectomy    Heart murmur    Hyperlipidemia    Borderline   Hypertension    Numbness and tingling in right hand    Pre-diabetes    borderline   Sleep apnea    Past Surgical History:  Procedure Laterality Date   BACK SURGERY     BALLOON DILATION N/A 05/22/2021   Procedure: BALLOON DILATION;  Surgeon: Cindie Carlin POUR, DO;  [facility];  Service: Endoscopy;  Laterality: N/A;   BIOPSY  11/18/2022   Procedure: BIOPSY;  Surgeon: Shaaron Lamar HERO, MD;  [facility];  Service: Endoscopy;;   CATARACT EXTRACTION W/PHACO Left 06/27/2023   Procedure: CATARACT EXTRACTION PHACO AND INTRAOCULAR LENS PLACEMENT (IOC);  Surgeon: Harrie Agent, MD;  Location: [facility];  Service: Ophthalmology;  Laterality: Left;  CDE: 3.56   CATARACT EXTRACTION W/PHACO Right 07/11/2023   Procedure: CATARACT EXTRACTION PHACO AND INTRAOCULAR LENS PLACEMENT (IOC);  Surgeon: Harrie Agent, MD;  Location: [facility];  Service: Ophthalmology;  Laterality: Right;  CDE: 2.82   CHOLECYSTECTOMY      COLONOSCOPY  2003   Dr. Shaaron: Anal canal hemorrhoids   COLONOSCOPY N/A 08/25/2014   Normal colonoscopy.  Next colonoscopy in 2026.  Procedure: COLONOSCOPY;  Surgeon: Lamar HERO Shaaron, MD;  [facility];  Service: Endoscopy;  Laterality: N/A;  1200   ESOPHAGOGASTRODUODENOSCOPY N/A 08/25/2014   Procedure: ESOPHAGOGASTRODUODENOSCOPY (EGD);  Surgeon: Lamar HERO Shaaron, MD;  [facility];  Service: Endoscopy;  Laterality: N/A;   ESOPHAGOGASTRODUODENOSCOPY (EGD) WITH PROPOFOL  N/A 05/22/2021   Web and distal esophagus, dilated, gastriti, biopsy positive for mildly active H. pylori gastritis.  s procedure: ESOPHAGOGASTRODUODENOSCOPY (EGD) WITH PROPOFOL ;  Surgeon: Cindie Carlin POUR, DO;  [facility];  Service: Endoscopy;  Laterality: N/A;  9:15am   ESOPHAGOGASTRODUODENOSCOPY (EGD) WITH PROPOFOL  N/A 11/18/2022   Procedure: ESOPHAGOGASTRODUODENOSCOPY (EGD) WITH PROPOFOL ;  Surgeon: Shaaron Lamar HERO, MD;  [facility];  Service: Endoscopy;  Laterality: N/A;  2:15 pm, asa 2   KNEE ARTHROSCOPY WITH MEDIAL MENISECTOMY Left 12/25/2022   Procedure: KNEE ARTHROSCOPY WITH PARTIAL LATERAL MENISCECTOMY DEBRIDEMENT ACL;  Surgeon: Margrette Taft BRAVO, MD;  Location: [facility];  Service: Orthopedics;  Laterality: Left;   MALONEY DILATION N/A 11/18/2022   Procedure: AGAPITO DILATION;  Surgeon: Shaaron Lamar HERO, MD;  [facility];  Service: Endoscopy;  Laterality: N/A;   SALPINGOOPHORECTOMY  Bilateral 11/06/2016   Procedure: BILATERAL SALPINGO OOPHORECTOMY;  Surgeon: Jayne Vonn DEL, MD;  Location: [facility];  Service: Gynecology;  Laterality: Bilateral;   SUPRACERVICAL ABDOMINAL HYSTERECTOMY N/A 11/06/2016   Procedure: HYSTERECTOMY SUPRACERVICAL ABDOMINAL;  Surgeon: Jayne Vonn DEL, MD;  Location: [facility];  Service: Gynecology;  Laterality: N/A;   TRANSFORAMINAL LUMBAR INTERBODY FUSION (TLIF) WITH PEDICLE SCREW FIXATION 2 LEVEL Left 07/28/2019   Procedure: LEFT-SIDED LUMBAR 4- 5, LUMBAR  5 -SACRUM1 TRANSFORAMINAL LUMBAR INTERBODY FUSION WITH INSTRUMENTATION AND ALLOGRAFT;  Surgeon: Beuford Anes, MD;  Location: [facility];  Service: Orthopedics;  Laterality: Left;   TUBAL LIGATION     Patient Active Problem List   Diagnosis Date Noted   Lumbosacral radiculopathy 01/21/2024   Vaginal irritation 11/29/2023   Situational anxiety 11/29/2023   Elevated LFTs 08/26/2023   Viral illness 08/06/2023   Epigastric pain 07/07/2023   S/P arthroscopy of left knee debridement of ACL/ lateral meniscectomy 12/25/22 01/02/2023   Bucket-handle tear of lateral meniscus of left knee as current injury 12/25/2022   Left anterior cruciate ligament tear 12/25/2022   Acute non-recurrent maxillary sinusitis 10/26/2022   Mucopurulent conjunctivitis of both eyes 10/26/2022   Acute bronchitis 10/25/2022   Maxillary sinusitis, acute 10/25/2022   Conjunctivitis 10/25/2022   Neck pain, chronic 08/28/2022   Sleep apnea 08/28/2022   Need for immunization against influenza 02/28/2022   Screening for cervical cancer 12/21/2021   H. pylori infection 12/05/2021   Snoring 11/23/2021   Insomnia 11/23/2021   Annual physical exam 10/16/2021   Dermatitis 10/16/2021   Need for varicella vaccine 10/16/2021   Diarrhea 07/05/2021   Stomach cramps 07/05/2021   Nausea & vomiting 07/05/2021   Encounter for vaccination 07/05/2021   Right foot pain 05/16/2021   Dysphagia 05/01/2021   Right shoulder pain 12/27/2020   Rash 12/13/2020   Chronic back pain 04/04/2020   History of back surgery 04/04/2020   Radiculopathy 07/28/2019   Borderline diabetes 12/25/2018   Hypertension 06/30/2018   S/P abdominal supracervical subtotal hysterectomy 11/06/2016   Trigger middle finger of right hand 04/24/2016   GERD (gastroesophageal reflux disease) 08/04/2014   Shoulder strain 07/26/2014   Degenerative arthritis of thumb 07/26/2014   Left knee pain 02/28/2014   Hip pain 04/15/2013   Epicondylitis, lateral (tennis elbow)  04/13/2013   Constipation 09/15/2012   Hyperlipidemia 11/28/2011   Obesity, Class I, BMI 30.0-34.9 (see actual BMI) 09/15/2011   ALLERGIC RHINITIS, SEASONAL 11/13/2007    PCP: Zarwolo, Gloria, FNP  REFERRING PROVIDER: Beuford Anes, MD  REFERRING DIAG: neck and low back pain  THERAPY DIAG:  Other low back pain  Neck pain  Impaired functional mobility, balance, gait, and endurance  Muscle weakness (generalized)  Rationale for Evaluation and Treatment: Rehabilitation  ONSET DATE: Chronic - since 2017  SUBJECTIVE:  SUBJECTIVE STATEMENT: Pt reports she has a bulging disc in her back and they are going to test her with the stimulator in her back. September 30 th  for follow up and then October 2nd for EMG. HEP doing good when she gets them done. Buckling happens and she will stumble.   neck pain travels down into her shoulders and upper arms. Reports low back pain is a constant pain. (See below for pain description). Reports they want to do a MRI but wanted her to do PT first. Had epidural about 2 months ago into low back with no improvement. Reports she also got one into R shoulder with no improvement.  Hand dominance: Right  PERTINENT HISTORY:  Back surgery in 2021 Previous L menisectomy Previous L ACL tear  PAIN:  Are you having pain? Yes: NPRS scale: 6/10 neck, 8/10 low back Pain location: neck, shoulders, upper arm, low back travels into LE   Pain description: Neck- catch, shoulders-ache, low back- bad ache, LE-burning sensation  Aggravating factors: neck - twisting, back- all movement Relieving factors: No relief   PRECAUTIONS: None  RED FLAGS: None     WEIGHT BEARING RESTRICTIONS: No  FALLS:  Has patient fallen in last 6 months? No  LIVING ENVIRONMENT: Stairs:  Yes: External: 4-5 steps; can reach both Has following equipment at home: has AD but does not use currently  OCCUPATION: Cashier, part-time   PLOF: Independent  PATIENT GOALS: To get better  NEXT MD VISIT: September 3rd, 2025  OBJECTIVE:  Note: Objective measures were completed at Evaluation unless otherwise noted.  DIAGNOSTIC FINDINGS:   There is some loss of cervical lordosis.  There are mild degenerative changes of base of C3 thru C5.  No fracture is noted.  Bone quality is good.   Impression:  mild degenerative changes of cervical spine, no acute findings.  PATIENT SURVEYS:  Modified Oswestry: Modified Oswestry Low Back Pain Disability Questionnaire: 28 / 50 = 56.0 %  Interpretation of scores: Score Category Description  0-20% Minimal Disability The patient can cope with most living activities. Usually no treatment is indicated apart from advice on lifting, sitting and exercise  21-40% Moderate Disability The patient experiences more pain and difficulty with sitting, lifting and standing. Travel and social life are more difficult and they may be disabled from work. Personal care, sexual activity and sleeping are not grossly affected, and the patient can usually be managed by conservative means  41-60% Severe Disability Pain remains the main problem in this group, but activities of daily living are affected. These patients require a detailed investigation  61-80% Crippled Back pain impinges on all aspects of the patient's life. Positive intervention is required  81-100% Bed-bound  These patients are either bed-bound or exaggerating their symptoms  Bluford FORBES Zoe DELENA Karon DELENA, et al. Surgery versus conservative management of stable thoracolumbar fracture: the PRESTO feasibility RCT. [place] ([place]): [employer]; 2021 Nov. [facility] Technology Assessment, No. 25.62.) Appendix 3, Oswestry Disability Index category descriptors. Available from:  FindJewelers.cz  Minimally Clinically Important Difference (MCID) = 12.8%   NDI: Neck Disability Index score: 24 / 50 = 48.0 %   Minimum Detectable Change (90% confidence): 5 points or 10% points  COGNITION: Overall cognitive status: Within functional limits for tasks assessed  SENSATION: Not tested  POSTURE: rounded shoulders, forward head, and increased thoracic kyphosis  PALPATION: N/A   CERVICAL ROM:   Active ROM A/PROM (deg) eval  Flexion 22  Extension 25  Right lateral flexion  Left lateral flexion   Right rotation 45  Left rotation 45   (Blank rows = not tested)  UPPER EXTREMITY ROM:  Active ROM Right eval Left eval  Shoulder flexion 133 133  Shoulder extension    Shoulder abduction    Shoulder adduction    Shoulder extension    Shoulder internal rotation    Shoulder external rotation    Elbow flexion    Elbow extension    Wrist flexion    Wrist extension    Wrist ulnar deviation    Wrist radial deviation    Wrist pronation    Wrist supination     (Blank rows = not tested)  UPPER EXTREMITY MMT:  MMT Right eval Left eval  Shoulder flexion 4- (p! In shoulder)  4-  Shoulder extension 4- 4-  Shoulder abduction    Shoulder adduction    Shoulder extension    Shoulder internal rotation    Shoulder external rotation    Middle trapezius    Lower trapezius    Elbow flexion 4-/5 (pain) 4/5  Elbow extension 4/5 (pain) 4/5  Wrist flexion    Wrist extension    Wrist ulnar deviation    Wrist radial deviation    Wrist pronation    Wrist supination    Grip strength     (Blank rows = not tested)  LUMBAR ROM:   AROM eval  Flexion 10% avail, *  Extension 5% avail, barely any movement *  Right lateral flexion   Left lateral flexion   Right rotation   Left rotation    (Blank rows = not tested)   *=painful  LOWER EXTREMITY MMT:    MMT Right 02/11/24 Left 02/11/24  Hip flexion 3/5 3-/5  Hip extension    Hip  abduction 3-/5 3-/5  Hip adduction    Hip internal rotation    Hip external rotation    Knee flexion 3/5 (pain) 3/5  Knee extension 3+/5 3+/5  Ankle dorsiflexion 3+/5 3+/5  Ankle plantarflexion 3+/5 3+/5  Ankle inversion    Ankle eversion     (Blank rows = not tested)  CERVICAL SPECIAL TESTS:  None tested this date  FUNCTIONAL TESTS:  5 times sit to stand: 43s w. Hesitancy and decreased functional end range extension on standing 2 minute walk test: 274' w/ lateral excessive motion 30s STS: 4 reps with decreased tolerance and poor mechanics   TREATMENT DATE:  02/11/24 TE Ball roll outs 20 reps for improving lumbopelvic mobility and BUE mobility DKTC w/ ball under feet for improving functional engagement of HS in neutral spine LTR w/ ball under knees for improving controlled spinal rotation mobility NMR  PPT 10 x 10s holds in seated PPT + single leg march in standing TA (testing see above)  FTSTS / 30s STS test MMT testing (UE and LE)  02/05/24: PT Eval and HEP  PATIENT EDUCATION:  Education details: PT evaluation, objective findings, POC, Importance of HEP, Precautions, Clinic policies  Person educated: Patient Education method: Explanation and Demonstration Education comprehension: verbalized understanding and returned demonstration  HOME EXERCISE PROGRAM: Access Code: [email or web address] URL: [email or web address] Date: 02/05/2024 Prepared by: Rosaria Powell-Butler  Exercises - Seated Assisted Cervical Rotation with Towel  - 2 x daily - 7 x weekly - 3 sets - 10 reps - Supine Posterior Pelvic Tilt  - 2 x daily - 7 x weekly - 3 sets - 10 reps - Supine Lower Trunk Rotation  - 2 x daily - 7 x weekly - 3 sets - 10 reps - Supine Cervical Flexion Extension on Pillow  - 2 x daily - 7 x weekly - 3 sets - 10 rep  ASSESSMENT:  CLINICAL  IMPRESSION: Pt reported stiffness and pain at beginning of session addressed with gentle mobility with cues for maintaining reduced pain ranges for improving gentle mobility. Decreased tolerance to gait and hesitant, slow mobility noted w/ reports of pain throughout movements. Required cues for increasing and managing pain free/reduced pain ranges. Discussed continued slow mobility and improving functional core activation with mobility as tolerated with understanding and HEP compliance needed as well. Recommend continued skilled PT services for improving controlled mobility and functional independence/tolerance to ADLs.   (Initial) Patient is a 63 y.o. female who was seen today for physical therapy evaluation and treatment for neck and low back pain. On this date, patient demonstrates decreased self perceived function via NDI and Modified Oswestry, decreased cervical ROM, decreased lumbar ROM, poor posture, difficulty with functional transfers, and increased pain with movement. Throughout session, patient must change positions due to discomfort, and with functional testing, pt knees demo buckling at times due to increased catching pain with lumbar motions. Patient will benefit from skilled physical therapy in order to address the above deficits to improve current level of function and quality of life.    OBJECTIVE IMPAIRMENTS: Abnormal gait, decreased activity tolerance, decreased balance, decreased endurance, decreased mobility, difficulty walking, decreased ROM, decreased strength, impaired UE functional use, improper body mechanics, postural dysfunction, and pain.   ACTIVITY LIMITATIONS: carrying, lifting, bending, sitting, standing, squatting, stairs, transfers, bed mobility, and Driving  PARTICIPATION LIMITATIONS: meal prep, cleaning, laundry, driving, community activity, occupation, and yard work  PERSONAL FACTORS: N/A are also affecting patient's functional outcome.   REHAB POTENTIAL:  Good  CLINICAL DECISION MAKING: Stable/uncomplicated  EVALUATION COMPLEXITY: Low   GOALS: Goals reviewed with patient? No  SHORT TERM GOALS: Target date: 02/26/24 Patient will be independent with performance of HEP to demonstrate adequate self management of symptoms.  Baseline:  Goal status: INITIAL  2.   Patient will report at least a 25% improvement with function and/or pain reduction overall since beginning PT. Baseline:  Goal status: INITIAL    LONG TERM GOALS: Target date: 04/01/24  Patient will improve Modified Oswestry score by at least 12.8 % in order to demonstrate improved self perceived function while meeting MCID. Baseline:  Goal status: INITIAL  2.  Patient will improve Neck Disability Index score by at least 5 points in order to demonstrate improved self perceived function while meeting MCID. Baseline:  Goal status: INITIAL  3.  Patient will improve lumbar flexion and extension to achieve at least 25% of available motion in order to demonstrate improved lumabr mobility needed for functional ADLs and iADLs.  Baseline:  Goal status: INITIAL  4.  Patient will improve cervical rotation to at least 60 degrees bilaterally in  order to safely perform functional tasks like driving.  Baseline:  Goal status: INITIAL  5.  Patient will increase 2 minute walk test by at least 40 ft in order to demonstrate improved activity tolerance.  Baseline:  Goal status: INITIAL    PLAN:  PT FREQUENCY: 2x/week  PT DURATION: 8 weeks  PLANNED INTERVENTIONS: 97164- PT Re-evaluation, 97110-Therapeutic exercises, 97530- Therapeutic activity, 97112- Neuromuscular re-education, 97535- Self Care, [id number]- Manual therapy, [phone]- Gait training, [phone]- Electrical stimulation (manual), [id number]- Traction (mechanical), [phone] (1-2 muscles), [id number] (3+ muscles)- Dry Needling, Patient/Family education, Balance training, Stair training, Taping, Joint mobilization, Spinal mobilization, Cryotherapy, and  Moist heat  PLAN FOR NEXT SESSION: Review HEP and goals, progress lumbar mobility, progress cervical mobility, general strengthening (core, LE, etc.)  12:15 PM, 02/11/24 Lamarr LITTIE Citrin PT, DPT [facility] [place] [phone] office   Managed Medicaid Authorization Request Treatment Start Date: February 29, 2024  Visit Dx Codes:  M54.59 M54.2 Z74.09 M62.81  Functional Tool Score:  NDI: Neck Disability Index score: 24 / 50 = 48.0 %  Modified Oswestry Low Back Pain Disability Questionnaire: 28 / 50 = 56.0 %  For all possible CPT codes, reference the Planned Interventions line above.     Check all conditions that are expected to impact treatment: {Conditions expected to impact treatment:None of these apply   If treatment provided at initial evaluation, no treatment charged due to lack of authorization.

## 2024-02-12 ENCOUNTER — Ambulatory Visit (HOSPITAL_COMMUNITY)
Admission: RE | Admit: 2024-02-12 | Discharge: 2024-02-12 | Disposition: A | Discharge status: Home / Self Care | Source: Ambulatory Visit | Attending: Family Medicine | Admitting: Family Medicine

## 2024-02-12 ENCOUNTER — Encounter (HOSPITAL_COMMUNITY): Payer: Self-pay

## 2024-02-12 DIAGNOSIS — Z1231 Encounter for screening mammogram for malignant neoplasm of breast: Secondary | ICD-10-CM | POA: Insufficient documentation

## 2024-02-13 ENCOUNTER — Other Ambulatory Visit: Payer: Self-pay | Admitting: Family Medicine

## 2024-02-16 ENCOUNTER — Encounter (HOSPITAL_COMMUNITY): Payer: Self-pay

## 2024-02-16 ENCOUNTER — Ambulatory Visit (HOSPITAL_COMMUNITY)

## 2024-02-16 DIAGNOSIS — M5459 Other low back pain: Secondary | ICD-10-CM | POA: Diagnosis not present

## 2024-02-16 DIAGNOSIS — M6281 Muscle weakness (generalized): Secondary | ICD-10-CM | POA: Diagnosis not present

## 2024-02-16 DIAGNOSIS — Z7409 Other reduced mobility: Secondary | ICD-10-CM | POA: Diagnosis not present

## 2024-02-16 DIAGNOSIS — M542 Cervicalgia: Secondary | ICD-10-CM | POA: Diagnosis not present

## 2024-02-16 NOTE — Therapy (Signed)
 OUTPATIENT PHYSICAL THERAPY CERVICAL/LUMBAR TREATMENT   Patient Name: Jacqueline [name] MRN: [id number] DOB:Jun 26, 1960, 63 y.o., female Today's Date: 02/16/2024  END OF SESSION:  PT End of Session - 02/16/24 1353     Visit Number 3    Number of Visits 12    Date for PT Re-Evaluation 03/04/24    Authorization Type  Medicaid [employer]    Authorization Time Period Approved 10 visits from 8/28 to 10/27    Authorization - Visit Number 3    Authorization - Number of Visits 10    Progress Note Due on Visit 10    PT Start Time 1353    PT Stop Time 1430    PT Time Calculation (min) 37 min    Activity Tolerance Patient tolerated treatment well;Patient limited by pain    Behavior During Therapy [facility] for tasks assessed/performed           Past Medical History:  Diagnosis Date   Anemia    Arthritis    per patient, back   Complication of anesthesia 2018   per patient HR dropped during partial hysterectomy surgery   Fibroid uterus    GERD (gastroesophageal reflux disease)    H/O total hysterectomy    Heart murmur    Hyperlipidemia    Borderline   Hypertension    Numbness and tingling in right hand    Pre-diabetes    borderline   Sleep apnea    Past Surgical History:  Procedure Laterality Date   BACK SURGERY     BALLOON DILATION N/A 05/22/2021   Procedure: BALLOON DILATION;  Surgeon: Cindie Carlin POUR, DO;  [facility];  Service: Endoscopy;  Laterality: N/A;   BIOPSY  11/18/2022   Procedure: BIOPSY;  Surgeon: Shaaron Lamar HERO, MD;  [facility];  Service: Endoscopy;;   CATARACT EXTRACTION W/PHACO Left 06/27/2023   Procedure: CATARACT EXTRACTION PHACO AND INTRAOCULAR LENS PLACEMENT (IOC);  Surgeon: Harrie Agent, MD;  Location: [facility];  Service: Ophthalmology;  Laterality: Left;  CDE: 3.56   CATARACT EXTRACTION W/PHACO Right 07/11/2023   Procedure: CATARACT EXTRACTION PHACO AND INTRAOCULAR LENS PLACEMENT (IOC);  Surgeon: Harrie Agent, MD;  Location: [facility];   Service: Ophthalmology;  Laterality: Right;  CDE: 2.82   CHOLECYSTECTOMY     COLONOSCOPY  2003   Dr. Shaaron: Anal canal hemorrhoids   COLONOSCOPY N/A 08/25/2014   Normal colonoscopy.  Next colonoscopy in 2026.  Procedure: COLONOSCOPY;  Surgeon: Lamar HERO Shaaron, MD;  [facility];  Service: Endoscopy;  Laterality: N/A;  1200   ESOPHAGOGASTRODUODENOSCOPY N/A 08/25/2014   Procedure: ESOPHAGOGASTRODUODENOSCOPY (EGD);  Surgeon: Lamar HERO Shaaron, MD;  [facility];  Service: Endoscopy;  Laterality: N/A;   ESOPHAGOGASTRODUODENOSCOPY (EGD) WITH PROPOFOL  N/A 05/22/2021   Web and distal esophagus, dilated, gastriti, biopsy positive for mildly active H. pylori gastritis.  s procedure: ESOPHAGOGASTRODUODENOSCOPY (EGD) WITH PROPOFOL ;  Surgeon: Cindie Carlin POUR, DO;  [facility];  Service: Endoscopy;  Laterality: N/A;  9:15am   ESOPHAGOGASTRODUODENOSCOPY (EGD) WITH PROPOFOL  N/A 11/18/2022   Procedure: ESOPHAGOGASTRODUODENOSCOPY (EGD) WITH PROPOFOL ;  Surgeon: Shaaron Lamar HERO, MD;  [facility];  Service: Endoscopy;  Laterality: N/A;  2:15 pm, asa 2   KNEE ARTHROSCOPY WITH MEDIAL MENISECTOMY Left 12/25/2022   Procedure: KNEE ARTHROSCOPY WITH PARTIAL LATERAL MENISCECTOMY DEBRIDEMENT ACL;  Surgeon: Margrette Taft BRAVO, MD;  Location: [facility];  Service: Orthopedics;  Laterality: Left;   MALONEY DILATION N/A 11/18/2022   Procedure: MALONEY DILATION;  Surgeon: Shaaron Lamar HERO, MD;  [facility];  Service: Endoscopy;  Laterality: N/A;   SALPINGOOPHORECTOMY Bilateral 11/06/2016   Procedure: BILATERAL SALPINGO OOPHORECTOMY;  Surgeon: Jayne Vonn DEL, MD;  Location: [facility];  Service: Gynecology;  Laterality: Bilateral;   SUPRACERVICAL ABDOMINAL HYSTERECTOMY N/A 11/06/2016   Procedure: HYSTERECTOMY SUPRACERVICAL ABDOMINAL;  Surgeon: Jayne Vonn DEL, MD;  Location: [facility];  Service: Gynecology;  Laterality: N/A;   TRANSFORAMINAL LUMBAR INTERBODY FUSION (TLIF) WITH PEDICLE  SCREW FIXATION 2 LEVEL Left 07/28/2019   Procedure: LEFT-SIDED LUMBAR 4- 5, LUMBAR 5 -SACRUM1 TRANSFORAMINAL LUMBAR INTERBODY FUSION WITH INSTRUMENTATION AND ALLOGRAFT;  Surgeon: Beuford Anes, MD;  Location: [facility];  Service: Orthopedics;  Laterality: Left;   TUBAL LIGATION     Patient Active Problem List   Diagnosis Date Noted   Lumbosacral radiculopathy 01/21/2024   Vaginal irritation 11/29/2023   Situational anxiety 11/29/2023   Elevated LFTs 08/26/2023   Viral illness 08/06/2023   Epigastric pain 07/07/2023   S/P arthroscopy of left knee debridement of ACL/ lateral meniscectomy 12/25/22 01/02/2023   Bucket-handle tear of lateral meniscus of left knee as current injury 12/25/2022   Left anterior cruciate ligament tear 12/25/2022   Acute non-recurrent maxillary sinusitis 10/26/2022   Mucopurulent conjunctivitis of both eyes 10/26/2022   Acute bronchitis 10/25/2022   Maxillary sinusitis, acute 10/25/2022   Conjunctivitis 10/25/2022   Neck pain, chronic 08/28/2022   Sleep apnea 08/28/2022   Need for immunization against influenza 02/28/2022   Screening for cervical cancer 12/21/2021   H. pylori infection 12/05/2021   Snoring 11/23/2021   Insomnia 11/23/2021   Annual physical exam 10/16/2021   Dermatitis 10/16/2021   Need for varicella vaccine 10/16/2021   Diarrhea 07/05/2021   Stomach cramps 07/05/2021   Nausea & vomiting 07/05/2021   Encounter for vaccination 07/05/2021   Right foot pain 05/16/2021   Dysphagia 05/01/2021   Right shoulder pain 12/27/2020   Rash 12/13/2020   Chronic back pain 04/04/2020   History of back surgery 04/04/2020   Radiculopathy 07/28/2019   Borderline diabetes 12/25/2018   Hypertension 06/30/2018   S/P abdominal supracervical subtotal hysterectomy 11/06/2016   Trigger middle finger of right hand 04/24/2016   GERD (gastroesophageal reflux disease) 08/04/2014   Shoulder strain 07/26/2014   Degenerative arthritis of thumb 07/26/2014   Left knee  pain 02/28/2014   Hip pain 04/15/2013   Epicondylitis, lateral (tennis elbow) 04/13/2013   Constipation 09/15/2012   Hyperlipidemia 11/28/2011   Obesity, Class I, BMI 30.0-34.9 (see actual BMI) 09/15/2011   ALLERGIC RHINITIS, SEASONAL 11/13/2007    PCP: Zarwolo, Gloria, FNP  REFERRING PROVIDER: Beuford Anes, MD  REFERRING DIAG: neck and low back pain  THERAPY DIAG:  Other low back pain  Neck pain  Impaired functional mobility, balance, gait, and endurance  Rationale for Evaluation and Treatment: Rehabilitation  ONSET DATE: Chronic - since 2017  SUBJECTIVE:  SUBJECTIVE STATEMENT: Pt reports increased pain in low back pain today, ~7.5-8/10 and some leg pain. Reports HEP is not painful while doing it but its hard for her to get through them all.   EVAL: neck pain travels down into her shoulders and upper arms. Reports low back pain is a constant pain. (See below for pain description). Reports they want to do a MRI but wanted her to do PT first. Had epidural about 2 months ago into low back with no improvement. Reports she also got one into R shoulder with no improvement.  Hand dominance: Right  PERTINENT HISTORY:  Back surgery in 2021 Previous L menisectomy Previous L ACL tear  PAIN:  Are you having pain? Yes: NPRS scale: 6/10 neck, 8/10 low back Pain location: neck, shoulders, upper arm, low back travels into LE   Pain description: Neck- catch, shoulders-ache, low back- bad ache, LE-burning sensation  Aggravating factors: neck - twisting, back- all movement Relieving factors: No relief   PRECAUTIONS: None  RED FLAGS: None     WEIGHT BEARING RESTRICTIONS: No  FALLS:  Has patient fallen in last 6 months? No  LIVING ENVIRONMENT: Stairs: Yes: External: 4-5 steps;  can reach both Has following equipment at home: has AD but does not use currently  OCCUPATION: Cashier, part-time   PLOF: Independent  PATIENT GOALS: To get better  NEXT MD VISIT: September 3rd, 2025  OBJECTIVE:  Note: Objective measures were completed at Evaluation unless otherwise noted.  DIAGNOSTIC FINDINGS:   There is some loss of cervical lordosis.  There are mild degenerative changes of base of C3 thru C5.  No fracture is noted.  Bone quality is good.   Impression:  mild degenerative changes of cervical spine, no acute findings.  PATIENT SURVEYS:  Modified Oswestry: Modified Oswestry Low Back Pain Disability Questionnaire: 28 / 50 = 56.0 %  Interpretation of scores: Score Category Description  0-20% Minimal Disability The patient can cope with most living activities. Usually no treatment is indicated apart from advice on lifting, sitting and exercise  21-40% Moderate Disability The patient experiences more pain and difficulty with sitting, lifting and standing. Travel and social life are more difficult and they may be disabled from work. Personal care, sexual activity and sleeping are not grossly affected, and the patient can usually be managed by conservative means  41-60% Severe Disability Pain remains the main problem in this group, but activities of daily living are affected. These patients require a detailed investigation  61-80% Crippled Back pain impinges on all aspects of the patient's life. Positive intervention is required  81-100% Bed-bound  These patients are either bed-bound or exaggerating their symptoms  Bluford FORBES Zoe DELENA Karon DELENA, et al. Surgery versus conservative management of stable thoracolumbar fracture: the PRESTO feasibility RCT. [place]): [employer]; 2021 Nov. [facility] Technology Assessment, No. 25.62.) Appendix 3, Oswestry Disability Index category descriptors. Available from:  FindJewelers.cz  Minimally Clinically Important Difference (MCID) = 12.8%   NDI: Neck Disability Index score: 24 / 50 = 48.0 %   Minimum Detectable Change (90% confidence): 5 points or 10% points  COGNITION: Overall cognitive status: Within functional limits for tasks assessed  SENSATION: Not tested  POSTURE: rounded shoulders, forward head, and increased thoracic kyphosis  PALPATION: N/A   CERVICAL ROM:   Active ROM A/PROM (deg) eval  Flexion 22  Extension 25  Right lateral flexion   Left lateral flexion   Right rotation 45  Left rotation 45   (Blank rows =  not tested)  UPPER EXTREMITY ROM:  Active ROM Right eval Left eval  Shoulder flexion 133 133  Shoulder extension    Shoulder abduction    Shoulder adduction    Shoulder extension    Shoulder internal rotation    Shoulder external rotation    Elbow flexion    Elbow extension    Wrist flexion    Wrist extension    Wrist ulnar deviation    Wrist radial deviation    Wrist pronation    Wrist supination     (Blank rows = not tested)  UPPER EXTREMITY MMT:  MMT Right eval Left eval  Shoulder flexion 4- (p! In shoulder)  4-  Shoulder extension 4- 4-  Shoulder abduction    Shoulder adduction    Shoulder extension    Shoulder internal rotation    Shoulder external rotation    Middle trapezius    Lower trapezius    Elbow flexion 4-/5 (pain) 4/5  Elbow extension 4/5 (pain) 4/5  Wrist flexion    Wrist extension    Wrist ulnar deviation    Wrist radial deviation    Wrist pronation    Wrist supination    Grip strength     (Blank rows = not tested)  LUMBAR ROM:   AROM eval  Flexion 10% avail, *  Extension 5% avail, barely any movement *  Right lateral flexion   Left lateral flexion   Right rotation   Left rotation    (Blank rows = not tested)   *=painful  LOWER EXTREMITY MMT:    MMT Right 02/11/24 Left 02/11/24  Hip flexion 3/5 3-/5  Hip extension    Hip  abduction 3-/5 3-/5  Hip adduction    Hip internal rotation    Hip external rotation    Knee flexion 3/5 (pain) 3/5  Knee extension 3+/5 3+/5  Ankle dorsiflexion 3+/5 3+/5  Ankle plantarflexion 3+/5 3+/5  Ankle inversion    Ankle eversion     (Blank rows = not tested)  CERVICAL SPECIAL TESTS:  None tested this date  FUNCTIONAL TESTS:  5 times sit to stand: 43s w. Hesitancy and decreased functional end range extension on standing 2 minute walk test: 274' w/ lateral excessive motion 30s STS: 4 reps with decreased tolerance and poor mechanics   TREATMENT DATE:  02/16/24: NuStep, seat 7, level 1, 5 min PPT 10 holds x12 TA contraction + SKTC/march, 10x TA contract + DKTC LE on physioball, 12x TA contract + LTR LE on physioball, 2x10 Supine Bridge, 10x Side-lying clamshells, 2x10 each side   02/11/24 TE Ball roll outs 20 reps for improving lumbopelvic mobility and BUE mobility DKTC w/ ball under feet for improving functional engagement of HS in neutral spine LTR w/ ball under knees for improving controlled spinal rotation mobility NMR  PPT 10 x 10s holds in seated PPT + single leg march in standing TA (testing see above)  FTSTS / 30s STS test MMT testing (UE and LE)  02/05/24: PT Eval and HEP  PATIENT EDUCATION:  Education details: PT evaluation, objective findings, POC, Importance of HEP, Precautions, Clinic policies  Person educated: Patient Education method: Explanation and Demonstration Education comprehension: verbalized understanding and returned demonstration  HOME EXERCISE PROGRAM: Access Code: [email or web address] URL: [email or web address] Date: 02/05/2024 Prepared by: Rosaria Powell-Butler  Exercises - Seated Assisted Cervical Rotation with Towel  - 2 x daily - 7 x weekly - 3 sets - 10 reps - Supine Posterior Pelvic Tilt  - 2 x  daily - 7 x weekly - 3 sets - 10 reps - Supine Lower Trunk Rotation  - 2 x daily - 7 x weekly - 3 sets - 10 reps - Supine Cervical Flexion Extension on Pillow  - 2 x daily - 7 x weekly - 3 sets - 10 rep  Exercises - Supine Bridge  - 2 x daily - 7 x weekly - 2 sets - 10 reps - Supine Transversus Abdominis Bracing - Hands on Stomach  - 2 x daily - 7 x weekly - 2 sets - 10 reps - Clamshell  - 2 x daily - 7 x weekly - 2 sets - 10 reps  ASSESSMENT:  CLINICAL IMPRESSION:  Pt arrives to session with continued increased low back, and LE pain that travels to posterior thighs. Began on NuStep for dynamic warm up. Pt winces and jumps in pain throughout due to quick instances of increased low back pain. Followed with spinal mobility in supine position. Pt with continued wincing/jumps of pain. Re-educated on pain free range of motion. Pt reporting inc tailbone pain following TA + march activity, improved with elevating LE on physio-ball. Pt demo good carryover of small ranges of motion to limit inc discomfort during remainder of session. Patient tolerated hip strengthening exercises well with limited pain. Added to HEP. Recommend continued skilled PT services for improving controlled mobility and functional independence/tolerance to ADLs.     (Initial) Patient is a 63 y.o. female who was seen today for physical therapy evaluation and treatment for neck and low back pain. On this date, patient demonstrates decreased self perceived function via NDI and Modified Oswestry, decreased cervical ROM, decreased lumbar ROM, poor posture, difficulty with functional transfers, and increased pain with movement. Throughout session, patient must change positions due to discomfort, and with functional testing, pt knees demo buckling at times due to increased catching pain with lumbar motions. Patient will benefit from skilled physical therapy in order to address the above deficits to improve current level of function and  quality of life.    OBJECTIVE IMPAIRMENTS: Abnormal gait, decreased activity tolerance, decreased balance, decreased endurance, decreased mobility, difficulty walking, decreased ROM, decreased strength, impaired UE functional use, improper body mechanics, postural dysfunction, and pain.   ACTIVITY LIMITATIONS: carrying, lifting, bending, sitting, standing, squatting, stairs, transfers, bed mobility, and Driving  PARTICIPATION LIMITATIONS: meal prep, cleaning, laundry, driving, community activity, occupation, and yard work  PERSONAL FACTORS: N/A are also affecting patient's functional outcome.   REHAB POTENTIAL: Good  CLINICAL DECISION MAKING: Stable/uncomplicated  EVALUATION COMPLEXITY: Low   GOALS: Goals reviewed with patient? No  SHORT TERM GOALS: Target date: 02/26/24 Patient will be independent with performance of HEP to demonstrate adequate self management of symptoms.  Baseline:  Goal status: INITIAL  2.   Patient will report at least a 25% improvement with function and/or pain reduction overall since beginning PT. Baseline:  Goal status: INITIAL    LONG TERM GOALS: Target date: 04/01/24  Patient will improve Modified Oswestry score by at least 12.8 %  in order to demonstrate improved self perceived function while meeting MCID. Baseline:  Goal status: INITIAL  2.  Patient will improve Neck Disability Index score by at least 5 points in order to demonstrate improved self perceived function while meeting MCID. Baseline:  Goal status: INITIAL  3.  Patient will improve lumbar flexion and extension to achieve at least 25% of available motion in order to demonstrate improved lumabr mobility needed for functional ADLs and iADLs.  Baseline:  Goal status: INITIAL  4.  Patient will improve cervical rotation to at least 60 degrees bilaterally in order to safely perform functional tasks like driving.  Baseline:  Goal status: INITIAL  5.  Patient will increase 2 minute walk  test by at least 40 ft in order to demonstrate improved activity tolerance.  Baseline:  Goal status: INITIAL    PLAN:  PT FREQUENCY: 2x/week  PT DURATION: 8 weeks  PLANNED INTERVENTIONS: [id number]- PT Re-evaluation, 97110-Therapeutic exercises, 97530- Therapeutic activity, [id number]- Neuromuscular re-education, 97535- Self Care, [id number]- Manual therapy, [id number]- Gait training, [phone]- Electrical stimulation (manual), [id number]- Traction (mechanical), [phone] (1-2 muscles), [id number] (3+ muscles)- Dry Needling, Patient/Family education, Balance training, Stair training, Taping, Joint mobilization, Spinal mobilization, Cryotherapy, and Moist heat  PLAN FOR NEXT SESSION: progress lumbar mobility, progress cervical mobility, general strengthening (core, LE, etc.)  2:46 PM, 02/16/24 Eboni Coval Powell-Butler, PT, DPT [facility] [place]

## 2024-02-19 ENCOUNTER — Encounter (HOSPITAL_COMMUNITY)

## 2024-02-20 ENCOUNTER — Encounter (HOSPITAL_COMMUNITY)

## 2024-02-20 ENCOUNTER — Other Ambulatory Visit: Payer: Self-pay | Admitting: Gastroenterology

## 2024-02-20 ENCOUNTER — Telehealth (HOSPITAL_COMMUNITY): Payer: Self-pay

## 2024-02-20 ENCOUNTER — Telehealth: Payer: Self-pay | Admitting: Family Medicine

## 2024-02-20 ENCOUNTER — Encounter (HOSPITAL_COMMUNITY): Payer: Self-pay

## 2024-02-20 DIAGNOSIS — Z419 Encounter for procedure for purposes other than remedying health state, unspecified: Secondary | ICD-10-CM | POA: Diagnosis not present

## 2024-02-20 NOTE — Telephone Encounter (Signed)
 Copied from CRM [id number]. Topic: Referral - Question >> Feb 20, 2024 11:24 AM DeAngela L wrote: Reason for CRM: patient calling to ask if her provider could send a referral to a provider in the network so she can get a stimulator in her back, this was suggested by another provider?  She had a referral from another provider but this provider was not in network so she had to call her provider to get a referral   Pt num [phone] (M)

## 2024-02-20 NOTE — Telephone Encounter (Signed)
 No show, called and spoke to pt who was confused on apt dates. Reminded next apt date and time with contact number given if needs to cancel/reschedule in the future. Pt was educated on no show policy.   Augustin Mclean, LPTA/CLT; [facility] [phone]

## 2024-02-25 ENCOUNTER — Encounter (HOSPITAL_COMMUNITY): Admitting: Physical Therapy

## 2024-02-26 ENCOUNTER — Encounter (HOSPITAL_COMMUNITY): Payer: Self-pay

## 2024-02-26 ENCOUNTER — Ambulatory Visit (HOSPITAL_COMMUNITY)

## 2024-02-26 DIAGNOSIS — M542 Cervicalgia: Secondary | ICD-10-CM | POA: Diagnosis not present

## 2024-02-26 DIAGNOSIS — Z7409 Other reduced mobility: Secondary | ICD-10-CM | POA: Diagnosis not present

## 2024-02-26 DIAGNOSIS — M5459 Other low back pain: Secondary | ICD-10-CM | POA: Diagnosis not present

## 2024-02-26 DIAGNOSIS — M6281 Muscle weakness (generalized): Secondary | ICD-10-CM

## 2024-02-26 NOTE — Therapy (Signed)
 OUTPATIENT PHYSICAL THERAPY CERVICAL/LUMBAR TREATMENT   Patient Name: Jacqueline [name] MRN: [id number] DOB:Sep 29, 1960, 63 y.o., female Today's Date: 02/26/2024  END OF SESSION:  PT End of Session - 02/26/24 1348     Visit Number 4    Number of Visits 12    Date for Recertification  03/04/24    Authorization Type [place] [employer]    Authorization Time Period Approved 10 visits from 8/28 to 10/27    Authorization - Visit Number 4    Authorization - Number of Visits 10    Progress Note Due on Visit 10    PT Start Time 1347    PT Stop Time 1431    PT Time Calculation (min) 44 min    Activity Tolerance Patient tolerated treatment well;Patient limited by pain    Behavior During Therapy [facility] for tasks assessed/performed           Past Medical History:  Diagnosis Date   Anemia    Arthritis    per patient, back   Complication of anesthesia 2018   per patient HR dropped during partial hysterectomy surgery   Fibroid uterus    GERD (gastroesophageal reflux disease)    H/O total hysterectomy    Heart murmur    Hyperlipidemia    Borderline   Hypertension    Numbness and tingling in right hand    Pre-diabetes    borderline   Sleep apnea    Past Surgical History:  Procedure Laterality Date   BACK SURGERY     BALLOON DILATION N/A 05/22/2021   Procedure: BALLOON DILATION;  Surgeon: Cindie Carlin POUR, DO;  [facility];  Service: Endoscopy;  Laterality: N/A;   BIOPSY  11/18/2022   Procedure: BIOPSY;  Surgeon: Shaaron Lamar HERO, MD;  [facility];  Service: Endoscopy;;   CATARACT EXTRACTION W/PHACO Left 06/27/2023   Procedure: CATARACT EXTRACTION PHACO AND INTRAOCULAR LENS PLACEMENT (IOC);  Surgeon: Harrie Agent, MD;  Location: [facility];  Service: Ophthalmology;  Laterality: Left;  CDE: 3.56   CATARACT EXTRACTION W/PHACO Right 07/11/2023   Procedure: CATARACT EXTRACTION PHACO AND INTRAOCULAR LENS PLACEMENT (IOC);  Surgeon: Harrie Agent, MD;  Location: [facility];   Service: Ophthalmology;  Laterality: Right;  CDE: 2.82   CHOLECYSTECTOMY     COLONOSCOPY  2003   Dr. Shaaron: Anal canal hemorrhoids   COLONOSCOPY N/A 08/25/2014   Normal colonoscopy.  Next colonoscopy in 2026.  Procedure: COLONOSCOPY;  Surgeon: Lamar HERO Shaaron, MD;  [facility];  Service: Endoscopy;  Laterality: N/A;  1200   ESOPHAGOGASTRODUODENOSCOPY N/A 08/25/2014   Procedure: ESOPHAGOGASTRODUODENOSCOPY (EGD);  Surgeon: Lamar HERO Shaaron, MD;  [facility];  Service: Endoscopy;  Laterality: N/A;   ESOPHAGOGASTRODUODENOSCOPY (EGD) WITH PROPOFOL  N/A 05/22/2021   Web and distal esophagus, dilated, gastriti, biopsy positive for mildly active H. pylori gastritis.  s procedure: ESOPHAGOGASTRODUODENOSCOPY (EGD) WITH PROPOFOL ;  Surgeon: Cindie Carlin POUR, DO;  [facility];  Service: Endoscopy;  Laterality: N/A;  9:15am   ESOPHAGOGASTRODUODENOSCOPY (EGD) WITH PROPOFOL  N/A 11/18/2022   Procedure: ESOPHAGOGASTRODUODENOSCOPY (EGD) WITH PROPOFOL ;  Surgeon: Shaaron Lamar HERO, MD;  [facility];  Service: Endoscopy;  Laterality: N/A;  2:15 pm, asa 2   KNEE ARTHROSCOPY WITH MEDIAL MENISECTOMY Left 12/25/2022   Procedure: KNEE ARTHROSCOPY WITH PARTIAL LATERAL MENISCECTOMY DEBRIDEMENT ACL;  Surgeon: Margrette Taft BRAVO, MD;  Location: [facility];  Service: Orthopedics;  Laterality: Left;   MALONEY DILATION N/A 11/18/2022   Procedure: MALONEY DILATION;  Surgeon: Shaaron Lamar HERO, MD;  [facility];  Service: Endoscopy;  Laterality: N/A;   SALPINGOOPHORECTOMY Bilateral 11/06/2016   Procedure: BILATERAL SALPINGO OOPHORECTOMY;  Surgeon: Jayne Vonn DEL, MD;  Location: [facility];  Service: Gynecology;  Laterality: Bilateral;   SUPRACERVICAL ABDOMINAL HYSTERECTOMY N/A 11/06/2016   Procedure: HYSTERECTOMY SUPRACERVICAL ABDOMINAL;  Surgeon: Jayne Vonn DEL, MD;  Location: [facility];  Service: Gynecology;  Laterality: N/A;   TRANSFORAMINAL LUMBAR INTERBODY FUSION (TLIF) WITH PEDICLE  SCREW FIXATION 2 LEVEL Left 07/28/2019   Procedure: LEFT-SIDED LUMBAR 4- 5, LUMBAR 5 -SACRUM1 TRANSFORAMINAL LUMBAR INTERBODY FUSION WITH INSTRUMENTATION AND ALLOGRAFT;  Surgeon: Beuford Anes, MD;  Location: [facility];  Service: Orthopedics;  Laterality: Left;   TUBAL LIGATION     Patient Active Problem List   Diagnosis Date Noted   Lumbosacral radiculopathy 01/21/2024   Vaginal irritation 11/29/2023   Situational anxiety 11/29/2023   Elevated LFTs 08/26/2023   Viral illness 08/06/2023   Epigastric pain 07/07/2023   S/P arthroscopy of left knee debridement of ACL/ lateral meniscectomy 12/25/22 01/02/2023   Bucket-handle tear of lateral meniscus of left knee as current injury 12/25/2022   Left anterior cruciate ligament tear 12/25/2022   Acute non-recurrent maxillary sinusitis 10/26/2022   Mucopurulent conjunctivitis of both eyes 10/26/2022   Acute bronchitis 10/25/2022   Maxillary sinusitis, acute 10/25/2022   Conjunctivitis 10/25/2022   Neck pain, chronic 08/28/2022   Sleep apnea 08/28/2022   Need for immunization against influenza 02/28/2022   Screening for cervical cancer 12/21/2021   H. pylori infection 12/05/2021   Snoring 11/23/2021   Insomnia 11/23/2021   Annual physical exam 10/16/2021   Dermatitis 10/16/2021   Need for varicella vaccine 10/16/2021   Diarrhea 07/05/2021   Stomach cramps 07/05/2021   Nausea & vomiting 07/05/2021   Encounter for vaccination 07/05/2021   Right foot pain 05/16/2021   Dysphagia 05/01/2021   Right shoulder pain 12/27/2020   Rash 12/13/2020   Chronic back pain 04/04/2020   History of back surgery 04/04/2020   Radiculopathy 07/28/2019   Borderline diabetes 12/25/2018   Hypertension 06/30/2018   S/P abdominal supracervical subtotal hysterectomy 11/06/2016   Trigger middle finger of right hand 04/24/2016   GERD (gastroesophageal reflux disease) 08/04/2014   Shoulder strain 07/26/2014   Degenerative arthritis of thumb 07/26/2014   Left knee  pain 02/28/2014   Hip pain 04/15/2013   Epicondylitis, lateral (tennis elbow) 04/13/2013   Constipation 09/15/2012   Hyperlipidemia 11/28/2011   Obesity, Class I, BMI 30.0-34.9 (see actual BMI) 09/15/2011   ALLERGIC RHINITIS, SEASONAL 11/13/2007    PCP: Zarwolo, Gloria, FNP  REFERRING PROVIDER: Beuford Anes, MD  REFERRING DIAG: neck and low back pain  THERAPY DIAG:  Other low back pain  Neck pain  Impaired functional mobility, balance, gait, and endurance  Muscle weakness (generalized)  Rationale for Evaluation and Treatment: Rehabilitation  ONSET DATE: Chronic - since 2017  SUBJECTIVE:  SUBJECTIVE STATEMENT: 02/26/24:  LBP scale 6/10, achy pain.  Exercises going well at home.   EVAL: neck pain travels down into her shoulders and upper arms. Reports low back pain is a constant pain. (See below for pain description). Reports they want to do a MRI but wanted her to do PT first. Had epidural about 2 months ago into low back with no improvement. Reports she also got one into R shoulder with no improvement.  Hand dominance: Right  PERTINENT HISTORY:  Back surgery in 2021 Previous L menisectomy Previous L ACL tear  PAIN:  Are you having pain? Yes: NPRS scale: 6/10 neck, 8/10 low back Pain location: neck, shoulders, upper arm, low back travels into LE   Pain description: Neck- catch, shoulders-ache, low back- bad ache, LE-burning sensation  Aggravating factors: neck - twisting, back- all movement Relieving factors: No relief   PRECAUTIONS: None  RED FLAGS: None     WEIGHT BEARING RESTRICTIONS: No  FALLS:  Has patient fallen in last 6 months? No  LIVING ENVIRONMENT: Stairs: Yes: External: 4-5 steps; can reach both Has following equipment at home: has AD but does not  use currently  OCCUPATION: Cashier, part-time   PLOF: Independent  PATIENT GOALS: To get better  NEXT MD VISIT: September 3rd, 2025  OBJECTIVE:  Note: Objective measures were completed at Evaluation unless otherwise noted.  DIAGNOSTIC FINDINGS:   There is some loss of cervical lordosis.  There are mild degenerative changes of base of C3 thru C5.  No fracture is noted.  Bone quality is good.   Impression:  mild degenerative changes of cervical spine, no acute findings.  PATIENT SURVEYS:  Modified Oswestry: Modified Oswestry Low Back Pain Disability Questionnaire: 28 / 50 = 56.0 %  Interpretation of scores: Score Category Description  0-20% Minimal Disability The patient can cope with most living activities. Usually no treatment is indicated apart from advice on lifting, sitting and exercise  21-40% Moderate Disability The patient experiences more pain and difficulty with sitting, lifting and standing. Travel and social life are more difficult and they may be disabled from work. Personal care, sexual activity and sleeping are not grossly affected, and the patient can usually be managed by conservative means  41-60% Severe Disability Pain remains the main problem in this group, but activities of daily living are affected. These patients require a detailed investigation  61-80% Crippled Back pain impinges on all aspects of the patient's life. Positive intervention is required  81-100% Bed-bound  These patients are either bed-bound or exaggerating their symptoms  Bluford FORBES Zoe DELENA Karon DELENA, et al. Surgery versus conservative management of stable thoracolumbar fracture: the PRESTO feasibility RCT. [place]): [employer]; 2021 Nov. [facility] Technology Assessment, No. 25.62.) Appendix 3, Oswestry Disability Index category descriptors. Available from: FindJewelers.cz  Minimally Clinically Important Difference (MCID) = 12.8%   NDI: Neck  Disability Index score: 24 / 50 = 48.0 %   Minimum Detectable Change (90% confidence): 5 points or 10% points  COGNITION: Overall cognitive status: Within functional limits for tasks assessed  SENSATION: Not tested  POSTURE: rounded shoulders, forward head, and increased thoracic kyphosis  PALPATION: N/A   CERVICAL ROM:   Active ROM A/PROM (deg) eval  Flexion 22  Extension 25  Right lateral flexion   Left lateral flexion   Right rotation 45  Left rotation 45   (Blank rows = not tested)  UPPER EXTREMITY ROM:  Active ROM Right eval Left eval  Shoulder flexion 133 133  Shoulder extension    Shoulder abduction    Shoulder adduction    Shoulder extension    Shoulder internal rotation    Shoulder external rotation    Elbow flexion    Elbow extension    Wrist flexion    Wrist extension    Wrist ulnar deviation    Wrist radial deviation    Wrist pronation    Wrist supination     (Blank rows = not tested)  UPPER EXTREMITY MMT:  MMT Right eval Left eval  Shoulder flexion 4- (p! In shoulder)  4-  Shoulder extension 4- 4-  Shoulder abduction    Shoulder adduction    Shoulder extension    Shoulder internal rotation    Shoulder external rotation    Middle trapezius    Lower trapezius    Elbow flexion 4-/5 (pain) 4/5  Elbow extension 4/5 (pain) 4/5  Wrist flexion    Wrist extension    Wrist ulnar deviation    Wrist radial deviation    Wrist pronation    Wrist supination    Grip strength     (Blank rows = not tested)  LUMBAR ROM:   AROM eval  Flexion 10% avail, *  Extension 5% avail, barely any movement *  Right lateral flexion   Left lateral flexion   Right rotation   Left rotation    (Blank rows = not tested)   *=painful  LOWER EXTREMITY MMT:    MMT Right 02/11/24 Left 02/11/24  Hip flexion 3/5 3-/5  Hip extension    Hip abduction 3-/5 3-/5  Hip adduction    Hip internal rotation    Hip external rotation    Knee flexion 3/5 (pain) 3/5   Knee extension 3+/5 3+/5  Ankle dorsiflexion 3+/5 3+/5  Ankle plantarflexion 3+/5 3+/5  Ankle inversion    Ankle eversion     (Blank rows = not tested)  CERVICAL SPECIAL TESTS:  None tested this date  FUNCTIONAL TESTS:  5 times sit to stand: 43s w. Hesitancy and decreased functional end range extension on standing 2 minute walk test: 274' w/ lateral excessive motion 30s STS: 4 reps with decreased tolerance and poor mechanics   TREATMENT DATE:  02/26/24 NuStep, seat 7, level 1, 5 min, UE/LE [place] Supine: - Decompression 2-5 5x5 - Deep breathing x 1' with 1 hand stomach/1 on chest - TrA activation paired with exhalation x 2' - Posterior pelvic tilt 10x 5 - TrA contractoin paired with marching 10x 5 - Bridge 10x - Hamstring stretch 3x 30 with hands behind knees  02/16/24: NuStep, seat 7, level 1, 5 min PPT 10 holds x12 TA contraction + SKTC/march, 10x TA contract + DKTC LE on physioball, 12x TA contract + LTR LE on physioball, 2x10 Supine Bridge, 10x Side-lying clamshells, 2x10 each side   02/11/24 TE Ball roll outs 20 reps for improving lumbopelvic mobility and BUE mobility DKTC w/ ball under feet for improving functional engagement of HS in neutral spine LTR w/ ball under knees for improving controlled spinal rotation mobility NMR  PPT 10 x 10s holds in seated PPT + single leg march in standing TA (testing see above)  FTSTS / 30s STS test MMT testing (UE and LE)  02/05/24: PT Eval and HEP  PATIENT EDUCATION:  Education details: PT evaluation, objective findings, POC, Importance of HEP, Precautions, Clinic policies  Person educated: Patient Education method: Explanation and Demonstration Education comprehension: verbalized understanding and returned demonstration  HOME EXERCISE PROGRAM: Access Code: [email or web address] URL:  [email or web address] Date: 02/05/2024 Prepared by: Rosaria Powell-Butler  Exercises - Seated Assisted Cervical Rotation with Towel  - 2 x daily - 7 x weekly - 3 sets - 10 reps - Supine Posterior Pelvic Tilt  - 2 x daily - 7 x weekly - 3 sets - 10 reps - Supine Lower Trunk Rotation  - 2 x daily - 7 x weekly - 3 sets - 10 reps - Supine Cervical Flexion Extension on Pillow  - 2 x daily - 7 x weekly - 3 sets - 10 rep  Exercises - Supine Bridge  - 2 x daily - 7 x weekly - 2 sets - 10 reps - Supine Transversus Abdominis Bracing - Hands on Stomach  - 2 x daily - 7 x weekly - 2 sets - 10 reps - Clamshell  - 2 x daily - 7 x weekly - 2 sets - 10 reps  02/26/24:  -Decompression 2-5 - Hamstring stretch 3x 30  ASSESSMENT:  CLINICAL IMPRESSION:  Pt continues to be limited by low back and buttocks pain that travels to posterior thighs.  Began with dynamic warm up on Nustep.  Pt with tendency to hold breath with abdominal sets, educated to pair with exhalation to reduce holding breath, improved mechanics following cueing.  Pt demo good carryover of small range of motion to limit increased discomfort.  Added decompression exercises for posterior chain strengthening and hamstring stretch with noted tightness BLE.  No reports of increased pain at EOS.     (Initial) Patient is a 63 y.o. female who was seen today for physical therapy evaluation and treatment for neck and low back pain. On this date, patient demonstrates decreased self perceived function via NDI and Modified Oswestry, decreased cervical ROM, decreased lumbar ROM, poor posture, difficulty with functional transfers, and increased pain with movement. Throughout session, patient must change positions due to discomfort, and with functional testing, pt knees demo buckling at times due to increased catching pain with lumbar motions. Patient will benefit from skilled physical therapy in order to address the above deficits to improve  current level of function and quality of life.    OBJECTIVE IMPAIRMENTS: Abnormal gait, decreased activity tolerance, decreased balance, decreased endurance, decreased mobility, difficulty walking, decreased ROM, decreased strength, impaired UE functional use, improper body mechanics, postural dysfunction, and pain.   ACTIVITY LIMITATIONS: carrying, lifting, bending, sitting, standing, squatting, stairs, transfers, bed mobility, and Driving  PARTICIPATION LIMITATIONS: meal prep, cleaning, laundry, driving, community activity, occupation, and yard work  PERSONAL FACTORS: N/A are also affecting patient's functional outcome.   REHAB POTENTIAL: Good  CLINICAL DECISION MAKING: Stable/uncomplicated  EVALUATION COMPLEXITY: Low   GOALS: Goals reviewed with patient? No  SHORT TERM GOALS: Target date: 02/26/24 Patient will be independent with performance of HEP to demonstrate adequate self management of symptoms.  Baseline:  Goal status: INITIAL  2.   Patient will report at least a 25% improvement with function and/or pain reduction overall since beginning PT. Baseline:  Goal status: INITIAL    LONG TERM GOALS: Target date: 04/01/24  Patient will improve Modified Oswestry score by at least 12.8 % in order to demonstrate improved self perceived function while meeting MCID. Baseline:  Goal status: INITIAL  2.  Patient will improve Neck Disability Index  score by at least 5 points in order to demonstrate improved self perceived function while meeting MCID. Baseline:  Goal status: INITIAL  3.  Patient will improve lumbar flexion and extension to achieve at least 25% of available motion in order to demonstrate improved lumabr mobility needed for functional ADLs and iADLs.  Baseline:  Goal status: INITIAL  4.  Patient will improve cervical rotation to at least 60 degrees bilaterally in order to safely perform functional tasks like driving.  Baseline:  Goal status: INITIAL  5.  Patient  will increase 2 minute walk test by at least 40 ft in order to demonstrate improved activity tolerance.  Baseline:  Goal status: INITIAL    PLAN:  PT FREQUENCY: 2x/week  PT DURATION: 8 weeks  PLANNED INTERVENTIONS: [id number]- PT Re-evaluation, 97110-Therapeutic exercises, 97530- Therapeutic activity, [id number]- Neuromuscular re-education, 97535- Self Care, [id number]- Manual therapy, [id number]- Gait training, [phone]- Electrical stimulation (manual), [id number]- Traction (mechanical), [phone] (1-2 muscles), [id number] (3+ muscles)- Dry Needling, Patient/Family education, Balance training, Stair training, Taping, Joint mobilization, Spinal mobilization, Cryotherapy, and Moist heat  PLAN FOR NEXT SESSION: progress lumbar mobility, progress cervical mobility, general strengthening (core, LE, etc.)  Augustin Mclean, LPTA/CLT; CBIS [phone]  2:42 PM, 02/26/24

## 2024-02-27 ENCOUNTER — Encounter (HOSPITAL_COMMUNITY): Payer: Self-pay

## 2024-02-27 ENCOUNTER — Other Ambulatory Visit: Payer: Self-pay

## 2024-02-27 ENCOUNTER — Ambulatory Visit (HOSPITAL_COMMUNITY)

## 2024-02-27 DIAGNOSIS — M5459 Other low back pain: Secondary | ICD-10-CM

## 2024-02-27 DIAGNOSIS — Z7409 Other reduced mobility: Secondary | ICD-10-CM

## 2024-02-27 DIAGNOSIS — M542 Cervicalgia: Secondary | ICD-10-CM | POA: Diagnosis not present

## 2024-02-27 DIAGNOSIS — G8929 Other chronic pain: Secondary | ICD-10-CM

## 2024-02-27 DIAGNOSIS — M6281 Muscle weakness (generalized): Secondary | ICD-10-CM | POA: Diagnosis not present

## 2024-02-27 NOTE — Telephone Encounter (Signed)
 Referral placed.

## 2024-02-27 NOTE — Therapy (Signed)
 OUTPATIENT PHYSICAL THERAPY CERVICAL/LUMBAR TREATMENT   Patient Name: Jacqueline [name] MRN: [id number] DOB:Sep 05, 1960, 63 y.o., female Today's Date: 02/27/2024  END OF SESSION:  PT End of Session - 02/27/24 1349     Visit Number 5    Number of Visits 12    Date for Recertification  03/04/24    Authorization Type [place] [employer]    Authorization Time Period Approved 10 visits from 8/28 to 10/27    Authorization - Visit Number 5    Authorization - Number of Visits 10    Progress Note Due on Visit 10    PT Start Time 1350    PT Stop Time 1428    PT Time Calculation (min) 38 min    Activity Tolerance Patient tolerated treatment well;Patient limited by pain    Behavior During Therapy [facility] for tasks assessed/performed            Past Medical History:  Diagnosis Date   Anemia    Arthritis    per patient, back   Complication of anesthesia 2018   per patient HR dropped during partial hysterectomy surgery   Fibroid uterus    GERD (gastroesophageal reflux disease)    H/O total hysterectomy    Heart murmur    Hyperlipidemia    Borderline   Hypertension    Numbness and tingling in right hand    Pre-diabetes    borderline   Sleep apnea    Past Surgical History:  Procedure Laterality Date   BACK SURGERY     BALLOON DILATION N/A 05/22/2021   Procedure: BALLOON DILATION;  Surgeon: Cindie Carlin POUR, DO;  [facility];  Service: Endoscopy;  Laterality: N/A;   BIOPSY  11/18/2022   Procedure: BIOPSY;  Surgeon: Shaaron Lamar HERO, MD;  [facility];  Service: Endoscopy;;   CATARACT EXTRACTION W/PHACO Left 06/27/2023   Procedure: CATARACT EXTRACTION PHACO AND INTRAOCULAR LENS PLACEMENT (IOC);  Surgeon: Harrie Agent, MD;  Location: [facility];  Service: Ophthalmology;  Laterality: Left;  CDE: 3.56   CATARACT EXTRACTION W/PHACO Right 07/11/2023   Procedure: CATARACT EXTRACTION PHACO AND INTRAOCULAR LENS PLACEMENT (IOC);  Surgeon: Harrie Agent, MD;  Location: [facility];  Service: Ophthalmology;  Laterality: Right;  CDE: 2.82   CHOLECYSTECTOMY     COLONOSCOPY  2003   Dr. Shaaron: Anal canal hemorrhoids   COLONOSCOPY N/A 08/25/2014   Normal colonoscopy.  Next colonoscopy in 2026.  Procedure: COLONOSCOPY;  Surgeon: Lamar HERO Shaaron, MD;  [facility];  Service: Endoscopy;  Laterality: N/A;  1200   ESOPHAGOGASTRODUODENOSCOPY N/A 08/25/2014   Procedure: ESOPHAGOGASTRODUODENOSCOPY (EGD);  Surgeon: Lamar HERO Shaaron, MD;  [facility];  Service: Endoscopy;  Laterality: N/A;   ESOPHAGOGASTRODUODENOSCOPY (EGD) WITH PROPOFOL  N/A 05/22/2021   Web and distal esophagus, dilated, gastriti, biopsy positive for mildly active H. pylori gastritis.  s procedure: ESOPHAGOGASTRODUODENOSCOPY (EGD) WITH PROPOFOL ;  Surgeon: Cindie Carlin POUR, DO;  [facility];  Service: Endoscopy;  Laterality: N/A;  9:15am   ESOPHAGOGASTRODUODENOSCOPY (EGD) WITH PROPOFOL  N/A 11/18/2022   Procedure: ESOPHAGOGASTRODUODENOSCOPY (EGD) WITH PROPOFOL ;  Surgeon: Shaaron Lamar HERO, MD;  [facility];  Service: Endoscopy;  Laterality: N/A;  2:15 pm, asa 2   KNEE ARTHROSCOPY WITH MEDIAL MENISECTOMY Left 12/25/2022   Procedure: KNEE ARTHROSCOPY WITH PARTIAL LATERAL MENISCECTOMY DEBRIDEMENT ACL;  Surgeon: Margrette Taft BRAVO, MD;  Location: [facility];  Service: Orthopedics;  Laterality: Left;   MALONEY DILATION N/A 11/18/2022   Procedure: AGAPITO  DILATION;  Surgeon: Shaaron Lamar HERO, MD;  [facility];  Service: Endoscopy;  Laterality: N/A;   SALPINGOOPHORECTOMY Bilateral 11/06/2016   Procedure: BILATERAL SALPINGO OOPHORECTOMY;  Surgeon: Jayne Vonn DEL, MD;  Location: [facility];  Service: Gynecology;  Laterality: Bilateral;   SUPRACERVICAL ABDOMINAL HYSTERECTOMY N/A 11/06/2016   Procedure: HYSTERECTOMY SUPRACERVICAL ABDOMINAL;  Surgeon: Jayne Vonn DEL, MD;  Location: [facility];  Service: Gynecology;  Laterality: N/A;   TRANSFORAMINAL LUMBAR INTERBODY FUSION (TLIF) WITH  PEDICLE SCREW FIXATION 2 LEVEL Left 07/28/2019   Procedure: LEFT-SIDED LUMBAR 4- 5, LUMBAR 5 -SACRUM1 TRANSFORAMINAL LUMBAR INTERBODY FUSION WITH INSTRUMENTATION AND ALLOGRAFT;  Surgeon: Beuford Anes, MD;  Location: [facility];  Service: Orthopedics;  Laterality: Left;   TUBAL LIGATION     Patient Active Problem List   Diagnosis Date Noted   Lumbosacral radiculopathy 01/21/2024   Vaginal irritation 11/29/2023   Situational anxiety 11/29/2023   Elevated LFTs 08/26/2023   Viral illness 08/06/2023   Epigastric pain 07/07/2023   S/P arthroscopy of left knee debridement of ACL/ lateral meniscectomy 12/25/22 01/02/2023   Bucket-handle tear of lateral meniscus of left knee as current injury 12/25/2022   Left anterior cruciate ligament tear 12/25/2022   Acute non-recurrent maxillary sinusitis 10/26/2022   Mucopurulent conjunctivitis of both eyes 10/26/2022   Acute bronchitis 10/25/2022   Maxillary sinusitis, acute 10/25/2022   Conjunctivitis 10/25/2022   Neck pain, chronic 08/28/2022   Sleep apnea 08/28/2022   Need for immunization against influenza 02/28/2022   Screening for cervical cancer 12/21/2021   H. pylori infection 12/05/2021   Snoring 11/23/2021   Insomnia 11/23/2021   Annual physical exam 10/16/2021   Dermatitis 10/16/2021   Need for varicella vaccine 10/16/2021   Diarrhea 07/05/2021   Stomach cramps 07/05/2021   Nausea & vomiting 07/05/2021   Encounter for vaccination 07/05/2021   Right foot pain 05/16/2021   Dysphagia 05/01/2021   Right shoulder pain 12/27/2020   Rash 12/13/2020   Chronic back pain 04/04/2020   History of back surgery 04/04/2020   Radiculopathy 07/28/2019   Borderline diabetes 12/25/2018   Hypertension 06/30/2018   S/P abdominal supracervical subtotal hysterectomy 11/06/2016   Trigger middle finger of right hand 04/24/2016   GERD (gastroesophageal reflux disease) 08/04/2014   Shoulder strain 07/26/2014   Degenerative arthritis of thumb 07/26/2014    Left knee pain 02/28/2014   Hip pain 04/15/2013   Epicondylitis, lateral (tennis elbow) 04/13/2013   Constipation 09/15/2012   Hyperlipidemia 11/28/2011   Obesity, Class I, BMI 30.0-34.9 (see actual BMI) 09/15/2011   ALLERGIC RHINITIS, SEASONAL 11/13/2007    PCP: Zarwolo, Gloria, FNP  REFERRING PROVIDER: Beuford Anes, MD  REFERRING DIAG: neck and low back pain  THERAPY DIAG:  Other low back pain  Neck pain  Impaired functional mobility, balance, gait, and endurance  Muscle weakness (generalized)  Rationale for Evaluation and Treatment: Rehabilitation  ONSET DATE: Chronic - since 2017  SUBJECTIVE:  SUBJECTIVE STATEMENT: Pt reports increased back pain 7/10 today. Pt states she has had a pretty bust day today, working and walking. Pt states HEP is going well.    EVAL: neck pain travels down into her shoulders and upper arms. Reports low back pain is a constant pain. (See below for pain description). Reports they want to do a MRI but wanted her to do PT first. Had epidural about 2 months ago into low back with no improvement. Reports she also got one into R shoulder with no improvement.  Hand dominance: Right  PERTINENT HISTORY:  Back surgery in 2021 Previous L menisectomy Previous L ACL tear  PAIN:  Are you having pain? Yes: NPRS scale: 6/10 neck, 8/10 low back Pain location: neck, shoulders, upper arm, low back travels into LE   Pain description: Neck- catch, shoulders-ache, low back- bad ache, LE-burning sensation  Aggravating factors: neck - twisting, back- all movement Relieving factors: No relief   PRECAUTIONS: None  RED FLAGS: None     WEIGHT BEARING RESTRICTIONS: No  FALLS:  Has patient fallen in last 6 months? No  LIVING ENVIRONMENT: Stairs: Yes:  External: 4-5 steps; can reach both Has following equipment at home: has AD but does not use currently  OCCUPATION: Cashier, part-time   PLOF: Independent  PATIENT GOALS: To get better  NEXT MD VISIT: September 3rd, 2025  OBJECTIVE:  Note: Objective measures were completed at Evaluation unless otherwise noted.  DIAGNOSTIC FINDINGS:   There is some loss of cervical lordosis.  There are mild degenerative changes of base of C3 thru C5.  No fracture is noted.  Bone quality is good.   Impression:  mild degenerative changes of cervical spine, no acute findings.  PATIENT SURVEYS:  Modified Oswestry: Modified Oswestry Low Back Pain Disability Questionnaire: 28 / 50 = 56.0 %  Interpretation of scores: Score Category Description  0-20% Minimal Disability The patient can cope with most living activities. Usually no treatment is indicated apart from advice on lifting, sitting and exercise  21-40% Moderate Disability The patient experiences more pain and difficulty with sitting, lifting and standing. Travel and social life are more difficult and they may be disabled from work. Personal care, sexual activity and sleeping are not grossly affected, and the patient can usually be managed by conservative means  41-60% Severe Disability Pain remains the main problem in this group, but activities of daily living are affected. These patients require a detailed investigation  61-80% Crippled Back pain impinges on all aspects of the patient's life. Positive intervention is required  81-100% Bed-bound  These patients are either bed-bound or exaggerating their symptoms  Bluford FORBES Zoe DELENA Karon DELENA, et al. Surgery versus conservative management of stable thoracolumbar fracture: the PRESTO feasibility RCT. [place]): [employer]; 2021 Nov. [facility] Technology Assessment, No. 25.62.) Appendix 3, Oswestry Disability Index category descriptors. Available from:  FindJewelers.cz  Minimally Clinically Important Difference (MCID) = 12.8%   NDI: Neck Disability Index score: 24 / 50 = 48.0 %   Minimum Detectable Change (90% confidence): 5 points or 10% points  COGNITION: Overall cognitive status: Within functional limits for tasks assessed  SENSATION: Not tested  POSTURE: rounded shoulders, forward head, and increased thoracic kyphosis  PALPATION: N/A   CERVICAL ROM:   Active ROM A/PROM (deg) eval  Flexion 22  Extension 25  Right lateral flexion   Left lateral flexion   Right rotation 45  Left rotation 45   (Blank rows = not tested)  UPPER  EXTREMITY ROM:  Active ROM Right eval Left eval  Shoulder flexion 133 133  Shoulder extension    Shoulder abduction    Shoulder adduction    Shoulder extension    Shoulder internal rotation    Shoulder external rotation    Elbow flexion    Elbow extension    Wrist flexion    Wrist extension    Wrist ulnar deviation    Wrist radial deviation    Wrist pronation    Wrist supination     (Blank rows = not tested)  UPPER EXTREMITY MMT:  MMT Right eval Left eval  Shoulder flexion 4- (p! In shoulder)  4-  Shoulder extension 4- 4-  Shoulder abduction    Shoulder adduction    Shoulder extension    Shoulder internal rotation    Shoulder external rotation    Middle trapezius    Lower trapezius    Elbow flexion 4-/5 (pain) 4/5  Elbow extension 4/5 (pain) 4/5  Wrist flexion    Wrist extension    Wrist ulnar deviation    Wrist radial deviation    Wrist pronation    Wrist supination    Grip strength     (Blank rows = not tested)  LUMBAR ROM:   AROM eval  Flexion 10% avail, *  Extension 5% avail, barely any movement *  Right lateral flexion   Left lateral flexion   Right rotation   Left rotation    (Blank rows = not tested)   *=painful  LOWER EXTREMITY MMT:    MMT Right 02/11/24 Left 02/11/24  Hip flexion 3/5 3-/5  Hip extension    Hip  abduction 3-/5 3-/5  Hip adduction    Hip internal rotation    Hip external rotation    Knee flexion 3/5 (pain) 3/5  Knee extension 3+/5 3+/5  Ankle dorsiflexion 3+/5 3+/5  Ankle plantarflexion 3+/5 3+/5  Ankle inversion    Ankle eversion     (Blank rows = not tested)  CERVICAL SPECIAL TESTS:  None tested this date  FUNCTIONAL TESTS:  5 times sit to stand: 43s w. Hesitancy and decreased functional end range extension on standing 2 minute walk test: 274' w/ lateral excessive motion 30s STS: 4 reps with decreased tolerance and poor mechanics   TREATMENT DATE:  02/27/2024  Manual Therapy: -CPA of Lumbar Spinal segments L3-L5, grade I-II mobilizations (pt unable to tolerate much pressure -STM of Lumbar Paraspinal musculature Therapeutic Exercise: -Supine bridges 1 set of 10 reps, 3 second holds, symptomatic, pt cued for max hip extension -Lateral stepping 3 laps 20 feet per lap, second 2 with RTB around ankles, pt cued for upright posture -Modified Crunch 1 set of 5 reps, pt cued for increased ROM, unable to place UE behind back due to shoulder pain -Supine cervical retractions, pt cued for sequencing and 3 second holds, 1 set of 7 reps Neuromuscular Re-education: -PPT 1 set of 10 reps, 3 second holds, pr cued to remain in pain free ROM -LTR 2 set of 10 reps bilaterally, pt cued to remain in pain free ROM and for 3 second holds -Cat/cow stretch 1 set of 7 reps each direction, 3 second holds  02/26/24 NuStep, seat 7, level 1, 5 min, UE/LE [place] Supine: - Decompression 2-5 5x5 - Deep breathing x 1' with 1 hand stomach/1 on chest - TrA activation paired with exhalation x 2' - Posterior pelvic tilt 10x 5 - TrA contractoin paired with marching 10x 5 - Bridge 10x -  Hamstring stretch 3x 30 with hands behind knees  02/16/24: NuStep, seat 7, level 1, 5 min PPT 10 holds x12 TA contraction + SKTC/march, 10x TA contract + DKTC LE on physioball, 12x TA contract + LTR LE on  physioball, 2x10 Supine Bridge, 10x Side-lying clamshells, 2x10 each side                                                                                                              PATIENT EDUCATION:  Education details: PT evaluation, objective findings, POC, Importance of HEP, Precautions, Clinic policies  Person educated: Patient Education method: Medical illustrator Education comprehension: verbalized understanding and returned demonstration  HOME EXERCISE PROGRAM: Access Code: [email or web address] URL: [email or web address] Date: 02/05/2024 Prepared by: Rosaria Powell-Butler  Exercises - Seated Assisted Cervical Rotation with Towel  - 2 x daily - 7 x weekly - 3 sets - 10 reps - Supine Posterior Pelvic Tilt  - 2 x daily - 7 x weekly - 3 sets - 10 reps - Supine Lower Trunk Rotation  - 2 x daily - 7 x weekly - 3 sets - 10 reps - Supine Cervical Flexion Extension on Pillow  - 2 x daily - 7 x weekly - 3 sets - 10 rep  Exercises - Supine Bridge  - 2 x daily - 7 x weekly - 2 sets - 10 reps - Supine Transversus Abdominis Bracing - Hands on Stomach  - 2 x daily - 7 x weekly - 2 sets - 10 reps - Clamshell  - 2 x daily - 7 x weekly - 2 sets - 10 reps  02/26/24:  -Decompression 2-5 - Hamstring stretch 3x 30  ASSESSMENT:  CLINICAL IMPRESSION:  Patient continues to demonstrate significant low back and neck pain, decreased LE/core strength, decreased gait quality and functional mobility. Patient also demonstrates decreased activity tolerance with prone lumbar stretches during today's session. Patient able to progress dynamic balance and core activation exercises today with modified crunch and lateral stepping, good performance with verbal cueing. Patient would continue to benefit from skilled physical therapy for decreased low back and neck pain, increased endurance with ambulation, increased LE/core strength, and improved balance for improved quality of life, improved  independence with management of low back/neck pain and continued progress towards therapy goals.      (Initial) Patient is a 63 y.o. female who was seen today for physical therapy evaluation and treatment for neck and low back pain. On this date, patient demonstrates decreased self perceived function via NDI and Modified Oswestry, decreased cervical ROM, decreased lumbar ROM, poor posture, difficulty with functional transfers, and increased pain with movement. Throughout session, patient must change positions due to discomfort, and with functional testing, pt knees demo buckling at times due to increased catching pain with lumbar motions. Patient will benefit from skilled physical therapy in order to address the above deficits to improve current level of function and quality of life.    OBJECTIVE IMPAIRMENTS: Abnormal gait, decreased activity tolerance, decreased balance, decreased endurance, decreased mobility, difficulty walking, decreased ROM,  decreased strength, impaired UE functional use, improper body mechanics, postural dysfunction, and pain.   ACTIVITY LIMITATIONS: carrying, lifting, bending, sitting, standing, squatting, stairs, transfers, bed mobility, and Driving  PARTICIPATION LIMITATIONS: meal prep, cleaning, laundry, driving, community activity, occupation, and yard work  PERSONAL FACTORS: N/A are also affecting patient's functional outcome.   REHAB POTENTIAL: Good  CLINICAL DECISION MAKING: Stable/uncomplicated  EVALUATION COMPLEXITY: Low   GOALS: Goals reviewed with patient? No  SHORT TERM GOALS: Target date: 02/26/24 Patient will be independent with performance of HEP to demonstrate adequate self management of symptoms.  Baseline:  Goal status: INITIAL  2.   Patient will report at least a 25% improvement with function and/or pain reduction overall since beginning PT. Baseline:  Goal status: INITIAL    LONG TERM GOALS: Target date: 04/01/24  Patient will  improve Modified Oswestry score by at least 12.8 % in order to demonstrate improved self perceived function while meeting MCID. Baseline:  Goal status: INITIAL  2.  Patient will improve Neck Disability Index score by at least 5 points in order to demonstrate improved self perceived function while meeting MCID. Baseline:  Goal status: INITIAL  3.  Patient will improve lumbar flexion and extension to achieve at least 25% of available motion in order to demonstrate improved lumabr mobility needed for functional ADLs and iADLs.  Baseline:  Goal status: INITIAL  4.  Patient will improve cervical rotation to at least 60 degrees bilaterally in order to safely perform functional tasks like driving.  Baseline:  Goal status: INITIAL  5.  Patient will increase 2 minute walk test by at least 40 ft in order to demonstrate improved activity tolerance.  Baseline:  Goal status: INITIAL    PLAN:  PT FREQUENCY: 2x/week  PT DURATION: 8 weeks  PLANNED INTERVENTIONS: 97164- PT Re-evaluation, 97110-Therapeutic exercises, 97530- Therapeutic activity, [id number]- Neuromuscular re-education, 97535- Self Care, 02859- Manual therapy, [id number]- Gait training, [phone]- Electrical stimulation (manual), [id number]- Traction (mechanical), [phone] (1-2 muscles), 20561 (3+ muscles)- Dry Needling, Patient/Family education, Balance training, Stair training, Taping, Joint mobilization, Spinal mobilization, Cryotherapy, and Moist heat  PLAN FOR NEXT SESSION: progress lumbar mobility, progress cervical mobility, general strengthening (core, LE, etc.)  Lang Ada, PT, DPT [facility] Office: [phone] 2:29 PM, 02/27/24

## 2024-03-02 ENCOUNTER — Telehealth: Payer: Self-pay | Admitting: Family Medicine

## 2024-03-02 ENCOUNTER — Other Ambulatory Visit: Payer: Self-pay | Admitting: Family Medicine

## 2024-03-02 DIAGNOSIS — E7849 Other hyperlipidemia: Secondary | ICD-10-CM

## 2024-03-02 NOTE — Telephone Encounter (Signed)
 Copied from CRM [id number]. Topic: Clinical - Medication Refill >> Mar 02, 2024 10:38 AM Tiffini S wrote: Medication: ezetimibe  (ZETIA ) 10 MG tablet  Has the patient contacted their pharmacy? Yes (Agent: If no, request that the patient contact the pharmacy for the refill. If patient does not wish to contact the pharmacy document the reason why and proceed with request.) (Agent: If yes, when and what did the pharmacy advise?)  This is the patient's preferred pharmacy:  [facility] [address] [place] - [address] [place] [address] Phone: [phone] Fax: [phone]   Is this the correct pharmacy for this prescription? Yes If no, delete pharmacy and type the correct one.   Has the prescription been filled recently? Yes  Is the patient out of the medication? Yes, took last tablet yesterday   Has the patient been seen for an appointment in the last year OR does the patient have an upcoming appointment? Yes  Can we respond through MyChart? Yes  Agent: Please be advised that Rx refills may take up to 3 business days. We ask that you follow-up with your pharmacy.

## 2024-03-02 NOTE — Telephone Encounter (Signed)
 Copied from CRM [id number]. Topic: General - Other >> Mar 02, 2024  9:11 AM Jacqueline [name] wrote: Reason for CRM: Pt requested a call back from clinic to discuss some issues regarding her request for a needed back device. Please call pt back at [phone].

## 2024-03-02 NOTE — Telephone Encounter (Unsigned)
 Copied from CRM #[id number]. Topic: Clinical - Medication Question >> Mar 02, 2024 10:42 AM Tiffini S wrote: Reason for CRM: Patient called with questions about medication levocetirizine (XYZAL ) 5 MG tablet/ she have stopped taking the medication for her liver- have no refills. Is this correct?  Please call [phone] and advise.

## 2024-03-03 ENCOUNTER — Ambulatory Visit (HOSPITAL_COMMUNITY): Admitting: Physical Therapy

## 2024-03-03 DIAGNOSIS — M5459 Other low back pain: Secondary | ICD-10-CM

## 2024-03-03 DIAGNOSIS — M542 Cervicalgia: Secondary | ICD-10-CM | POA: Diagnosis not present

## 2024-03-03 DIAGNOSIS — Z7409 Other reduced mobility: Secondary | ICD-10-CM

## 2024-03-03 DIAGNOSIS — M6281 Muscle weakness (generalized): Secondary | ICD-10-CM | POA: Diagnosis not present

## 2024-03-03 NOTE — Therapy (Signed)
 OUTPATIENT PHYSICAL THERAPY CERVICAL/LUMBAR TREATMENT   Patient Name: Jacqueline [name] MRN: [id number] DOB:24-Apr-1961, 63 y.o., female Today's Date: 03/03/2024  END OF SESSION:  PT End of Session - 03/03/24 1358     Visit Number 6    Number of Visits 12    Date for Recertification  03/04/24    Authorization Type [place] [employer]    Authorization Time Period Approved 10 visits from 8/28 to 10/27    Authorization - Number of Visits 10    Progress Note Due on Visit 10    PT Start Time 1351    PT Stop Time 1420    PT Time Calculation (min) 29 min    Activity Tolerance Patient tolerated treatment well;Patient limited by pain    Behavior During Therapy [facility] for tasks assessed/performed             Past Medical History:  Diagnosis Date   Anemia    Arthritis    per patient, back   Complication of anesthesia 2018   per patient HR dropped during partial hysterectomy surgery   Fibroid uterus    GERD (gastroesophageal reflux disease)    H/O total hysterectomy    Heart murmur    Hyperlipidemia    Borderline   Hypertension    Numbness and tingling in right hand    Pre-diabetes    borderline   Sleep apnea    Past Surgical History:  Procedure Laterality Date   BACK SURGERY     BALLOON DILATION N/A 05/22/2021   Procedure: BALLOON DILATION;  Surgeon: Cindie Carlin POUR, DO;  [facility];  Service: Endoscopy;  Laterality: N/A;   BIOPSY  11/18/2022   Procedure: BIOPSY;  Surgeon: Shaaron Lamar HERO, MD;  [facility];  Service: Endoscopy;;   CATARACT EXTRACTION W/PHACO Left 06/27/2023   Procedure: CATARACT EXTRACTION PHACO AND INTRAOCULAR LENS PLACEMENT (IOC);  Surgeon: Harrie Agent, MD;  Location: [facility];  Service: Ophthalmology;  Laterality: Left;  CDE: 3.56   CATARACT EXTRACTION W/PHACO Right 07/11/2023   Procedure: CATARACT EXTRACTION PHACO AND INTRAOCULAR LENS PLACEMENT (IOC);  Surgeon: Harrie Agent, MD;  Location: [facility];  Service: Ophthalmology;   Laterality: Right;  CDE: 2.82   CHOLECYSTECTOMY     COLONOSCOPY  2003   Dr. Shaaron: Anal canal hemorrhoids   COLONOSCOPY N/A 08/25/2014   Normal colonoscopy.  Next colonoscopy in 2026.  Procedure: COLONOSCOPY;  Surgeon: Lamar HERO Shaaron, MD;  [facility];  Service: Endoscopy;  Laterality: N/A;  1200   ESOPHAGOGASTRODUODENOSCOPY N/A 08/25/2014   Procedure: ESOPHAGOGASTRODUODENOSCOPY (EGD);  Surgeon: Lamar HERO Shaaron, MD;  [facility];  Service: Endoscopy;  Laterality: N/A;   ESOPHAGOGASTRODUODENOSCOPY (EGD) WITH PROPOFOL  N/A 05/22/2021   Web and distal esophagus, dilated, gastriti, biopsy positive for mildly active H. pylori gastritis.  s procedure: ESOPHAGOGASTRODUODENOSCOPY (EGD) WITH PROPOFOL ;  Surgeon: Cindie Carlin POUR, DO;  [facility];  Service: Endoscopy;  Laterality: N/A;  9:15am   ESOPHAGOGASTRODUODENOSCOPY (EGD) WITH PROPOFOL  N/A 11/18/2022   Procedure: ESOPHAGOGASTRODUODENOSCOPY (EGD) WITH PROPOFOL ;  Surgeon: Shaaron Lamar HERO, MD;  [facility];  Service: Endoscopy;  Laterality: N/A;  2:15 pm, asa 2   KNEE ARTHROSCOPY WITH MEDIAL MENISECTOMY Left 12/25/2022   Procedure: KNEE ARTHROSCOPY WITH PARTIAL LATERAL MENISCECTOMY DEBRIDEMENT ACL;  Surgeon: Margrette Taft BRAVO, MD;  Location: [facility];  Service: Orthopedics;  Laterality: Left;   MALONEY DILATION N/A 11/18/2022   Procedure: AGAPITO DILATION;  Surgeon: Shaaron Lamar HERO, MD;  [facility];  Service: Endoscopy;  Laterality: N/A;   SALPINGOOPHORECTOMY Bilateral 11/06/2016   Procedure: BILATERAL SALPINGO OOPHORECTOMY;  Surgeon: Jayne Vonn DEL, MD;  Location: [facility];  Service: Gynecology;  Laterality: Bilateral;   SUPRACERVICAL ABDOMINAL HYSTERECTOMY N/A 11/06/2016   Procedure: HYSTERECTOMY SUPRACERVICAL ABDOMINAL;  Surgeon: Jayne Vonn DEL, MD;  Location: [facility];  Service: Gynecology;  Laterality: N/A;   TRANSFORAMINAL LUMBAR INTERBODY FUSION (TLIF) WITH PEDICLE SCREW FIXATION 2 LEVEL Left  07/28/2019   Procedure: LEFT-SIDED LUMBAR 4- 5, LUMBAR 5 -SACRUM1 TRANSFORAMINAL LUMBAR INTERBODY FUSION WITH INSTRUMENTATION AND ALLOGRAFT;  Surgeon: Beuford Anes, MD;  Location: [facility];  Service: Orthopedics;  Laterality: Left;   TUBAL LIGATION     Patient Active Problem List   Diagnosis Date Noted   Lumbosacral radiculopathy 01/21/2024   Vaginal irritation 11/29/2023   Situational anxiety 11/29/2023   Elevated LFTs 08/26/2023   Viral illness 08/06/2023   Epigastric pain 07/07/2023   S/P arthroscopy of left knee debridement of ACL/ lateral meniscectomy 12/25/22 01/02/2023   Bucket-handle tear of lateral meniscus of left knee as current injury 12/25/2022   Left anterior cruciate ligament tear 12/25/2022   Acute non-recurrent maxillary sinusitis 10/26/2022   Mucopurulent conjunctivitis of both eyes 10/26/2022   Acute bronchitis 10/25/2022   Maxillary sinusitis, acute 10/25/2022   Conjunctivitis 10/25/2022   Neck pain, chronic 08/28/2022   Sleep apnea 08/28/2022   Need for immunization against influenza 02/28/2022   Screening for cervical cancer 12/21/2021   H. pylori infection 12/05/2021   Snoring 11/23/2021   Insomnia 11/23/2021   Annual physical exam 10/16/2021   Dermatitis 10/16/2021   Need for varicella vaccine 10/16/2021   Diarrhea 07/05/2021   Stomach cramps 07/05/2021   Nausea & vomiting 07/05/2021   Encounter for vaccination 07/05/2021   Right foot pain 05/16/2021   Dysphagia 05/01/2021   Right shoulder pain 12/27/2020   Rash 12/13/2020   Chronic back pain 04/04/2020   History of back surgery 04/04/2020   Radiculopathy 07/28/2019   Borderline diabetes 12/25/2018   Hypertension 06/30/2018   S/P abdominal supracervical subtotal hysterectomy 11/06/2016   Trigger middle finger of right hand 04/24/2016   GERD (gastroesophageal reflux disease) 08/04/2014   Shoulder strain 07/26/2014   Degenerative arthritis of thumb 07/26/2014   Left knee pain 02/28/2014   Hip pain  04/15/2013   Epicondylitis, lateral (tennis elbow) 04/13/2013   Constipation 09/15/2012   Hyperlipidemia 11/28/2011   Obesity, Class I, BMI 30.0-34.9 (see actual BMI) 09/15/2011   ALLERGIC RHINITIS, SEASONAL 11/13/2007    PCP: Zarwolo, Gloria, FNP  REFERRING PROVIDER: Beuford Anes, MD  REFERRING DIAG: neck and low back pain  THERAPY DIAG:  Other low back pain  Neck pain  Impaired functional mobility, balance, gait, and endurance  Rationale for Evaluation and Treatment: Rehabilitation  ONSET DATE: Chronic - since 2017  SUBJECTIVE:  SUBJECTIVE STATEMENT: Pt arrived on time but was not checked in; limited session today.  Pt reports currently with 6-7/10 today.    EVAL: neck pain travels down into her shoulders and upper arms. Reports low back pain is a constant pain. (See below for pain description). Reports they want to do a MRI but wanted her to do PT first. Had epidural about 2 months ago into low back with no improvement. Reports she also got one into R shoulder with no improvement.  Hand dominance: Right  PERTINENT HISTORY:  Back surgery in 2021 Previous L menisectomy Previous L ACL tear  PAIN:  Are you having pain? Yes: NPRS scale: 6/10 neck, 7/10 low back Pain location: neck, shoulders, upper arm, low back travels into LE   Pain description: Neck- catch, shoulders-ache, low back- bad ache, LE-burning sensation  Aggravating factors: neck - twisting, back- all movement Relieving factors: No relief   PRECAUTIONS: None  RED FLAGS: None     WEIGHT BEARING RESTRICTIONS: No  FALLS:  Has patient fallen in last 6 months? No  LIVING ENVIRONMENT: Stairs: Yes: External: 4-5 steps; can reach both Has following equipment at home: has AD but does not use  currently  OCCUPATION: Cashier, part-time   PLOF: Independent  PATIENT GOALS: To get better  NEXT MD VISIT: September 3rd, 2025  OBJECTIVE:  Note: Objective measures were completed at Evaluation unless otherwise noted.  DIAGNOSTIC FINDINGS:   There is some loss of cervical lordosis.  There are mild degenerative changes of base of C3 thru C5.  No fracture is noted.  Bone quality is good.   Impression:  mild degenerative changes of cervical spine, no acute findings.  PATIENT SURVEYS:  Modified Oswestry: Modified Oswestry Low Back Pain Disability Questionnaire: 28 / 50 = 56.0 %  Interpretation of scores: Score Category Description  0-20% Minimal Disability The patient can cope with most living activities. Usually no treatment is indicated apart from advice on lifting, sitting and exercise  21-40% Moderate Disability The patient experiences more pain and difficulty with sitting, lifting and standing. Travel and social life are more difficult and they may be disabled from work. Personal care, sexual activity and sleeping are not grossly affected, and the patient can usually be managed by conservative means  41-60% Severe Disability Pain remains the main problem in this group, but activities of daily living are affected. These patients require a detailed investigation  61-80% Crippled Back pain impinges on all aspects of the patient's life. Positive intervention is required  81-100% Bed-bound  These patients are either bed-bound or exaggerating their symptoms  Bluford FORBES Zoe DELENA Karon DELENA, et al. Surgery versus conservative management of stable thoracolumbar fracture: the PRESTO feasibility RCT. [place]): [employer]; 2021 Nov. [facility] Technology Assessment, No. 25.62.) Appendix 3, Oswestry Disability Index category descriptors. Available from: FindJewelers.cz  Minimally Clinically Important Difference (MCID) = 12.8%   NDI: Neck  Disability Index score: 24 / 50 = 48.0 %   Minimum Detectable Change (90% confidence): 5 points or 10% points  COGNITION: Overall cognitive status: Within functional limits for tasks assessed  SENSATION: Not tested  POSTURE: rounded shoulders, forward head, and increased thoracic kyphosis  PALPATION: N/A   CERVICAL ROM:   Active ROM A/PROM (deg) eval  Flexion 22  Extension 25  Right lateral flexion   Left lateral flexion   Right rotation 45  Left rotation 45   (Blank rows = not tested)  UPPER EXTREMITY ROM:  Active ROM Right eval  Left eval  Shoulder flexion 133 133  Shoulder extension    Shoulder abduction    Shoulder adduction    Shoulder extension    Shoulder internal rotation    Shoulder external rotation    Elbow flexion    Elbow extension    Wrist flexion    Wrist extension    Wrist ulnar deviation    Wrist radial deviation    Wrist pronation    Wrist supination     (Blank rows = not tested)  UPPER EXTREMITY MMT:  MMT Right eval Left eval  Shoulder flexion 4- (p! In shoulder)  4-  Shoulder extension 4- 4-  Shoulder abduction    Shoulder adduction    Shoulder extension    Shoulder internal rotation    Shoulder external rotation    Middle trapezius    Lower trapezius    Elbow flexion 4-/5 (pain) 4/5  Elbow extension 4/5 (pain) 4/5  Wrist flexion    Wrist extension    Wrist ulnar deviation    Wrist radial deviation    Wrist pronation    Wrist supination    Grip strength     (Blank rows = not tested)  LUMBAR ROM:   AROM eval  Flexion 10% avail, *  Extension 5% avail, barely any movement *  Right lateral flexion   Left lateral flexion   Right rotation   Left rotation    (Blank rows = not tested)   *=painful  LOWER EXTREMITY MMT:    MMT Right 02/11/24 Left 02/11/24  Hip flexion 3/5 3-/5  Hip extension    Hip abduction 3-/5 3-/5  Hip adduction    Hip internal rotation    Hip external rotation    Knee flexion 3/5 (pain) 3/5   Knee extension 3+/5 3+/5  Ankle dorsiflexion 3+/5 3+/5  Ankle plantarflexion 3+/5 3+/5  Ankle inversion    Ankle eversion     (Blank rows = not tested)  CERVICAL SPECIAL TESTS:  None tested this date  FUNCTIONAL TESTS:  5 times sit to stand: 43s w. Hesitancy and decreased functional end range extension on standing 2 minute walk test: 274' w/ lateral excessive motion 30s STS: 4 reps with decreased tolerance and poor mechanics   TREATMENT DATE:  03/03/24 Supine:   PPT 2X10 3 holds Bridge 2X10 3 holds Hip abduction with RTB 10X5 SLR 10X each Cervical retractions 10X3  Lower trunk rotations 10X3   02/27/2024  Manual Therapy: -CPA of Lumbar Spinal segments L3-L5, grade I-II mobilizations (pt unable to tolerate much pressure -STM of Lumbar Paraspinal musculature Therapeutic Exercise: -Supine bridges 1 set of 10 reps, 3 second holds, symptomatic, pt cued for max hip extension -Lateral stepping 3 laps 20 feet per lap, second 2 with RTB around ankles, pt cued for upright posture -Modified Crunch 1 set of 5 reps, pt cued for increased ROM, unable to place UE behind back due to shoulder pain -Supine cervical retractions, pt cued for sequencing and 3 second holds, 1 set of 7 reps Neuromuscular Re-education: -PPT 1 set of 10 reps, 3 second holds, pr cued to remain in pain free ROM -LTR 2 set of 10 reps bilaterally, pt cued to remain in pain free ROM and for 3 second holds -Cat/cow stretch 1 set of 7 reps each direction, 3 second holds  02/26/24 NuStep, seat 7, level 1, 5 min, UE/LE [place] Supine: - Decompression 2-5 5x5 - Deep breathing x 1' with 1 hand stomach/1 on chest - TrA activation  paired with exhalation x 2' - Posterior pelvic tilt 10x 5 - TrA contractoin paired with marching 10x 5 - Bridge 10x - Hamstring stretch 3x 30 with hands behind knees  02/16/24: NuStep, seat 7, level 1, 5 min PPT 10 holds x12 TA contraction + SKTC/march, 10x TA contract + DKTC LE on  physioball, 12x TA contract + LTR LE on physioball, 2x10 Supine Bridge, 10x Side-lying clamshells, 2x10 each side                                                                                                              PATIENT EDUCATION:  Education details: PT evaluation, objective findings, POC, Importance of HEP, Precautions, Clinic policies  Person educated: Patient Education method: Medical illustrator Education comprehension: verbalized understanding and returned demonstration  HOME EXERCISE PROGRAM: Access Code: [email or web address] URL: [email or web address] Date: 02/05/2024 Prepared by: Rosaria Powell-Butler  Exercises - Seated Assisted Cervical Rotation with Towel  - 2 x daily - 7 x weekly - 3 sets - 10 reps - Supine Posterior Pelvic Tilt  - 2 x daily - 7 x weekly - 3 sets - 10 reps - Supine Lower Trunk Rotation  - 2 x daily - 7 x weekly - 3 sets - 10 reps - Supine Cervical Flexion Extension on Pillow  - 2 x daily - 7 x weekly - 3 sets - 10 rep  Exercises - Supine Bridge  - 2 x daily - 7 x weekly - 2 sets - 10 reps - Supine Transversus Abdominis Bracing - Hands on Stomach  - 2 x daily - 7 x weekly - 2 sets - 10 reps - Clamshell  - 2 x daily - 7 x weekly - 2 sets - 10 reps  02/26/24:  -Decompression 2-5 - Hamstring stretch 3x 30  ASSESSMENT:  CLINICAL IMPRESSION:  Continued with focus on completing LE and cervical therex to increase AROM and strength.  Pt with some discomfort completing cervical retraction as well as bridge.  SLR added to day with ability to complete without pain and in overall good form and full ROM.  Pt mobilizes very slowly overall with positive pain behaviors.  Pt utilizes logroll technique for supine to/from sit transfers.  Difficulty sleeping full night due to pain/discomfort.  Treatment limited today due to late check in. Patient will  continue to benefit from skilled physical therapy for decreased low back and neck pain, increased  endurance with ambulation, increased LE/core strength, and improved balance for improved quality of life, improved independence with management of low back/neck pain and continued progress towards therapy goals.    (Initial) Patient is a 63 y.o. female who was seen today for physical therapy evaluation and treatment for neck and low back pain. On this date, patient demonstrates decreased self perceived function via NDI and Modified Oswestry, decreased cervical ROM, decreased lumbar ROM, poor posture, difficulty with functional transfers, and increased pain with movement. Throughout session, patient must change positions due to discomfort, and with functional testing, pt knees demo buckling at times due to  increased catching pain with lumbar motions. Patient will benefit from skilled physical therapy in order to address the above deficits to improve current level of function and quality of life.    OBJECTIVE IMPAIRMENTS: Abnormal gait, decreased activity tolerance, decreased balance, decreased endurance, decreased mobility, difficulty walking, decreased ROM, decreased strength, impaired UE functional use, improper body mechanics, postural dysfunction, and pain.   ACTIVITY LIMITATIONS: carrying, lifting, bending, sitting, standing, squatting, stairs, transfers, bed mobility, and Driving  PARTICIPATION LIMITATIONS: meal prep, cleaning, laundry, driving, community activity, occupation, and yard work  PERSONAL FACTORS: N/A are also affecting patient's functional outcome.   REHAB POTENTIAL: Good  CLINICAL DECISION MAKING: Stable/uncomplicated  EVALUATION COMPLEXITY: Low   GOALS: Goals reviewed with patient? No  SHORT TERM GOALS: Target date: 02/26/24 Patient will be independent with performance of HEP to demonstrate adequate self management of symptoms.  Baseline:  Goal status: INITIAL  2.   Patient will report at least a 25% improvement with function and/or pain reduction overall since  beginning PT. Baseline:  Goal status: INITIAL    LONG TERM GOALS: Target date: 04/01/24  Patient will improve Modified Oswestry score by at least 12.8 % in order to demonstrate improved self perceived function while meeting MCID. Baseline:  Goal status: INITIAL  2.  Patient will improve Neck Disability Index score by at least 5 points in order to demonstrate improved self perceived function while meeting MCID. Baseline:  Goal status: INITIAL  3.  Patient will improve lumbar flexion and extension to achieve at least 25% of available motion in order to demonstrate improved lumabr mobility needed for functional ADLs and iADLs.  Baseline:  Goal status: INITIAL  4.  Patient will improve cervical rotation to at least 60 degrees bilaterally in order to safely perform functional tasks like driving.  Baseline:  Goal status: INITIAL  5.  Patient will increase 2 minute walk test by at least 40 ft in order to demonstrate improved activity tolerance.  Baseline:  Goal status: INITIAL    PLAN:  PT FREQUENCY: 2x/week  PT DURATION: 8 weeks  PLANNED INTERVENTIONS: 97164- PT Re-evaluation, 97110-Therapeutic exercises, 97530- Therapeutic activity, 97112- Neuromuscular re-education, 97535- Self Care, 02859- Manual therapy, [phone]- Gait training, [phone]- Electrical stimulation (manual), [id number]- Traction (mechanical), [phone] (1-2 muscles), 20561 (3+ muscles)- Dry Needling, Patient/Family education, Balance training, Stair training, Taping, Joint mobilization, Spinal mobilization, Cryotherapy, and Moist heat  PLAN FOR NEXT SESSION: progress lumbar mobility, progress cervical mobility, general strengthening (core, LE, etc.)  Greig KATHEE Fuse, PTA/CLT [facility] Ph: [phone] 2:22 PM, 03/03/24

## 2024-03-03 NOTE — Telephone Encounter (Signed)
 Spoke to patient , advised patient to call insurance and see what DME company to send back device to

## 2024-03-04 ENCOUNTER — Encounter: Payer: Self-pay | Admitting: Orthopaedic Surgery

## 2024-03-04 ENCOUNTER — Ambulatory Visit: Admitting: Orthopaedic Surgery

## 2024-03-04 DIAGNOSIS — M25511 Pain in right shoulder: Secondary | ICD-10-CM | POA: Diagnosis not present

## 2024-03-04 DIAGNOSIS — M542 Cervicalgia: Secondary | ICD-10-CM | POA: Diagnosis not present

## 2024-03-04 DIAGNOSIS — G8929 Other chronic pain: Secondary | ICD-10-CM | POA: Diagnosis not present

## 2024-03-04 NOTE — Progress Notes (Signed)
 My finger is going to sleep all the time.  She has paresthesias from the neck to the right index finger with decreased sensation of the right index finger and weakness.  She has pain that is intense and runs down the arm at times.  She has no new trauma.   I tried to get MRI on her neck in the past and was denied.  She is worse now.  She has weakness of the triceps 3 of 5; weak to almost absent triceps reflex on the right, numbness of the index finger on the right, grip much weaker on the right dominant hand than the left hand, and pain in the neck but no spasm.  ROM of neck is full but tender.  I am very concerned about nerve root compression involving C6, C7.  I feel she definitely needs an MRI and may need surgery.  I will request MRI again.  Encounter Diagnoses  Name Primary?   Chronic right shoulder pain Yes   Cervicalgia    I am retiring in one week. Get the MRI, return in three weeks.  I have informed the patient I will be retiring from medical practice and from this office on March 11, 2024.  The patient has been offered continuing care with Dr. Margrette or Dr. Onesimo of this office.  The patient may choose another provider and the records will be forwarded after proper signature and notification.  Patient understands and agrees.  Call if any problem.  Precautions discussed.  Electronically Signed Lemond Stable, MD 9/25/20252:27 PM

## 2024-03-05 ENCOUNTER — Encounter (HOSPITAL_COMMUNITY): Payer: Self-pay

## 2024-03-05 ENCOUNTER — Ambulatory Visit (HOSPITAL_COMMUNITY)

## 2024-03-05 DIAGNOSIS — M542 Cervicalgia: Secondary | ICD-10-CM

## 2024-03-05 DIAGNOSIS — M6281 Muscle weakness (generalized): Secondary | ICD-10-CM | POA: Diagnosis not present

## 2024-03-05 DIAGNOSIS — Z7409 Other reduced mobility: Secondary | ICD-10-CM | POA: Diagnosis not present

## 2024-03-05 DIAGNOSIS — M5459 Other low back pain: Secondary | ICD-10-CM

## 2024-03-05 NOTE — Therapy (Signed)
 PHYSICAL THERAPY DISCHARGE SUMMARY  Visits from Start of Care: 7  Current functional level related to goals / functional outcomes: See last visit on 03/05/24   Remaining deficits: See last visit on 03/05/24   Education / Equipment: HEP   Patient agrees to discharge. Patient goals were not met. Patient is being discharged due to lack of progress.  5:03 PM, 03/05/24 Rosaria Settler, PT, DPT [facility]

## 2024-03-05 NOTE — Therapy (Signed)
 OUTPATIENT PHYSICAL THERAPY CERVICAL/LUMBAR TREATMENT  Progress Note Reporting Period 02/05/24 to 03/05/24  See note below for Objective Data and Assessment of Progress/Goals.     Patient Name: Jacqueline [name] MRN: [id number] DOB:July 08, 1960, 63 y.o., female Today's Date: 03/05/2024  END OF SESSION:  PT End of Session - 03/05/24 1414     Visit Number 7    Number of Visits 12    Date for Recertification  03/04/24    Authorization Type [place] Medicaid [employer]    Authorization Time Period Approved 10 visits from 8/28 to 10/27    Authorization - Visit Number 6    Authorization - Number of Visits 10    Progress Note Due on Visit 10    PT Start Time 1415    PT Stop Time 1506    PT Time Calculation (min) 51 min    Activity Tolerance Patient tolerated treatment well;Patient limited by pain    Behavior During Therapy [facility] for tasks assessed/performed             Past Medical History:  Diagnosis Date   Anemia    Arthritis    per patient, back   Complication of anesthesia 2018   per patient HR dropped during partial hysterectomy surgery   Fibroid uterus    GERD (gastroesophageal reflux disease)    H/O total hysterectomy    Heart murmur    Hyperlipidemia    Borderline   Hypertension    Numbness and tingling in right hand    Pre-diabetes    borderline   Sleep apnea    Past Surgical History:  Procedure Laterality Date   BACK SURGERY     BALLOON DILATION N/A 05/22/2021   Procedure: BALLOON DILATION;  Surgeon: Cindie Carlin POUR, DO;  [facility]: [facility];  Service: Endoscopy;  Laterality: N/A;   BIOPSY  11/18/2022   Procedure: BIOPSY;  Surgeon: Shaaron Lamar HERO, MD;  Location: [facility];  Service: Endoscopy;;   CATARACT EXTRACTION W/PHACO Left 06/27/2023   Procedure: CATARACT EXTRACTION PHACO AND INTRAOCULAR LENS PLACEMENT (IOC);  Surgeon: Harrie Agent, MD;  Location: [facility];  Service: Ophthalmology;  Laterality: Left;  CDE: 3.56   CATARACT EXTRACTION W/PHACO Right  07/11/2023   Procedure: CATARACT EXTRACTION PHACO AND INTRAOCULAR LENS PLACEMENT (IOC);  Surgeon: Harrie Agent, MD;  Location: [facility];  Service: Ophthalmology;  Laterality: Right;  CDE: 2.82   CHOLECYSTECTOMY     COLONOSCOPY  2003   Dr. Shaaron: Anal canal hemorrhoids   COLONOSCOPY N/A 08/25/2014   Normal colonoscopy.  Next colonoscopy in 2026.  Procedure: COLONOSCOPY;  Surgeon: Lamar HERO Shaaron, MD;  [facility];  Service: Endoscopy;  Laterality: N/A;  1200   ESOPHAGOGASTRODUODENOSCOPY N/A 08/25/2014   Procedure: ESOPHAGOGASTRODUODENOSCOPY (EGD);  Surgeon: Lamar HERO Shaaron, MD;  [facility]: [facility];  Service: Endoscopy;  Laterality: N/A;   ESOPHAGOGASTRODUODENOSCOPY (EGD) WITH PROPOFOL  N/A 05/22/2021   Web and distal esophagus, dilated, gastriti, biopsy positive for mildly active H. pylori gastritis.  s procedure: ESOPHAGOGASTRODUODENOSCOPY (EGD) WITH PROPOFOL ;  Surgeon: Cindie Carlin POUR, DO;  [facility]: [facility];  Service: Endoscopy;  Laterality: N/A;  9:15am   ESOPHAGOGASTRODUODENOSCOPY (EGD) WITH PROPOFOL  N/A 11/18/2022   Procedure: ESOPHAGOGASTRODUODENOSCOPY (EGD) WITH PROPOFOL ;  Surgeon: Shaaron Lamar HERO, MD;  [facility];  Service: Endoscopy;  Laterality: N/A;  2:15 pm, asa 2   KNEE ARTHROSCOPY WITH MEDIAL MENISECTOMY Left 12/25/2022   Procedure: KNEE ARTHROSCOPY WITH PARTIAL LATERAL MENISCECTOMY DEBRIDEMENT ACL;  Surgeon: Margrette Bars  E, MD;  Location: [facility];  Service: Orthopedics;  Laterality: Left;   MALONEY DILATION N/A 11/18/2022   Procedure: AGAPITO DILATION;  Surgeon: Shaaron Lamar HERO, MD;  [facility];  Service: Endoscopy;  Laterality: N/A;   SALPINGOOPHORECTOMY Bilateral 11/06/2016   Procedure: BILATERAL SALPINGO OOPHORECTOMY;  Surgeon: Jayne Vonn DEL, MD;  Location: [facility];  Service: Gynecology;  Laterality: Bilateral;   SUPRACERVICAL ABDOMINAL HYSTERECTOMY N/A 11/06/2016   Procedure: HYSTERECTOMY SUPRACERVICAL ABDOMINAL;  Surgeon: Jayne Vonn DEL, MD;  Location: [facility];  Service: Gynecology;  Laterality: N/A;   TRANSFORAMINAL LUMBAR INTERBODY FUSION (TLIF) WITH PEDICLE SCREW FIXATION 2 LEVEL Left 07/28/2019   Procedure: LEFT-SIDED LUMBAR 4- 5, LUMBAR 5 -SACRUM1 TRANSFORAMINAL LUMBAR INTERBODY FUSION WITH INSTRUMENTATION AND ALLOGRAFT;  Surgeon: Beuford Anes, MD;  Location: [facility];  Service: Orthopedics;  Laterality: Left;   TUBAL LIGATION     Patient Active Problem List   Diagnosis Date Noted   Lumbosacral radiculopathy 01/21/2024   Vaginal irritation 11/29/2023   Situational anxiety 11/29/2023   Elevated LFTs 08/26/2023   Viral illness 08/06/2023   Epigastric pain 07/07/2023   S/P arthroscopy of left knee debridement of ACL/ lateral meniscectomy 12/25/22 01/02/2023   Bucket-handle tear of lateral meniscus of left knee as current injury 12/25/2022   Left anterior cruciate ligament tear 12/25/2022   Acute non-recurrent maxillary sinusitis 10/26/2022   Mucopurulent conjunctivitis of both eyes 10/26/2022   Acute bronchitis 10/25/2022   Maxillary sinusitis, acute 10/25/2022   Conjunctivitis 10/25/2022   Neck pain, chronic 08/28/2022   Sleep apnea 08/28/2022   Need for immunization against influenza 02/28/2022   Screening for cervical cancer 12/21/2021   H. pylori infection 12/05/2021   Snoring 11/23/2021   Insomnia 11/23/2021   Annual physical exam 10/16/2021   Dermatitis 10/16/2021   Need for varicella vaccine 10/16/2021   Diarrhea 07/05/2021   Stomach cramps 07/05/2021   Nausea & vomiting 07/05/2021   Encounter for vaccination 07/05/2021   Right foot pain 05/16/2021   Dysphagia 05/01/2021   Right shoulder pain 12/27/2020   Rash 12/13/2020   Chronic back pain 04/04/2020   History of back surgery 04/04/2020   Radiculopathy 07/28/2019   Borderline diabetes 12/25/2018   Hypertension 06/30/2018   S/P abdominal supracervical subtotal hysterectomy 11/06/2016   Trigger middle finger of right hand 04/24/2016   GERD  (gastroesophageal reflux disease) 08/04/2014   Shoulder strain 07/26/2014   Degenerative arthritis of thumb 07/26/2014   Left knee pain 02/28/2014   Hip pain 04/15/2013   Epicondylitis, lateral (tennis elbow) 04/13/2013   Constipation 09/15/2012   Hyperlipidemia 11/28/2011   Obesity, Class I, BMI 30.0-34.9 (see actual BMI) 09/15/2011   ALLERGIC RHINITIS, SEASONAL 11/13/2007    PCP: Zarwolo, Gloria, FNP  REFERRING PROVIDER: Beuford Anes, MD Next apt scheduled: EMG 03/11/24.  REFERRING DIAG: neck and low back pain  THERAPY DIAG:  Other low back pain  Neck pain  Impaired functional mobility, balance, gait, and endurance  Muscle weakness (generalized)  Rationale for Evaluation and Treatment: Rehabilitation  ONSET DATE: Chronic - since 2017  SUBJECTIVE:  SUBJECTIVE STATEMENT: Pt stated she is feeling about the same since beginning therapy.  Current pain scale 7/10 today in neck, shoulder and radicular symptom to palm of Rt hand as well as back pain with radicular symptoms posterior leg into the side ending at knee.    EVAL: neck pain travels down into her shoulders and upper arms. Reports low back pain is a constant pain. (See below for pain description). Reports they want to do a MRI but wanted her to do PT first. Had epidural about 2 months ago into low back with no improvement. Reports she also got one into R shoulder with no improvement.  Hand dominance: Right  PERTINENT HISTORY:  Back surgery in 2021 Previous L menisectomy Previous L ACL tear  PAIN:  Are you having pain? Yes: NPRS scale: 6/10 neck, 7/10 low back Pain location: neck, shoulders, upper arm, low back travels into LE   Pain description: Neck- catch, shoulders-ache, low back- bad ache, LE-burning sensation   Aggravating factors: neck - twisting, back- all movement Relieving factors: No relief   PRECAUTIONS: None  RED FLAGS: None     WEIGHT BEARING RESTRICTIONS: No  FALLS:  Has patient fallen in last 6 months? No  LIVING ENVIRONMENT: Stairs: Yes: External: 4-5 steps; can reach both Has following equipment at home: has AD but does not use currently  OCCUPATION: Cashier, part-time   PLOF: Independent  PATIENT GOALS: To get better  NEXT MD VISIT: September 3rd, 2025  OBJECTIVE:  Note: Objective measures were completed at Evaluation unless otherwise noted.  DIAGNOSTIC FINDINGS:   There is some loss of cervical lordosis.  There are mild degenerative changes of base of C3 thru C5.  No fracture is noted.  Bone quality is good.   Impression:  mild degenerative changes of cervical spine, no acute findings.  PATIENT SURVEYS:  Modified Oswestry: Modified Oswestry Low Back Pain Disability Questionnaire: 28 / 50 = 56.0 %  Interpretation of scores: Score Category Description  0-20% Minimal Disability The patient can cope with most living activities. Usually no treatment is indicated apart from advice on lifting, sitting and exercise  21-40% Moderate Disability The patient experiences more pain and difficulty with sitting, lifting and standing. Travel and social life are more difficult and they may be disabled from work. Personal care, sexual activity and sleeping are not grossly affected, and the patient can usually be managed by conservative means  41-60% Severe Disability Pain remains the main problem in this group, but activities of daily living are affected. These patients require a detailed investigation  61-80% Crippled Back pain impinges on all aspects of the patient's life. Positive intervention is required  81-100% Bed-bound  These patients are either bed-bound or exaggerating their symptoms  Bluford FORBES Zoe DELENA Karon DELENA, et al. Surgery versus conservative management of  stable thoracolumbar fracture: the PRESTO feasibility RCT. [place]): [employer]; 2021 Nov. [facility] Technology Assessment, No. 25.62.) Appendix 3, Oswestry Disability Index category descriptors. Available from: FindJewelers.cz  Minimally Clinically Important Difference (MCID) = 12.8%   NDI: Neck Disability Index score: 24 / 50 = 48.0 %   Minimum Detectable Change (90% confidence): 5 points or 10% points  COGNITION: Overall cognitive status: Within functional limits for tasks assessed  SENSATION: Not tested  POSTURE: rounded shoulders, forward head, and increased thoracic kyphosis  PALPATION: N/A   CERVICAL ROM:   Active ROM A/PROM (deg) eval 03/05/24:  Flexion 22 25  Extension 25 25  Right lateral flexion  Left lateral flexion    Right rotation 45 45  Left rotation 45 45   (Blank rows = not tested)  UPPER EXTREMITY ROM:  Active ROM Right eval Left eval  Shoulder flexion 133 133  Shoulder extension    Shoulder abduction    Shoulder adduction    Shoulder extension    Shoulder internal rotation    Shoulder external rotation    Elbow flexion    Elbow extension    Wrist flexion    Wrist extension    Wrist ulnar deviation    Wrist radial deviation    Wrist pronation    Wrist supination     (Blank rows = not tested)  UPPER EXTREMITY MMT:  MMT Right eval Left eval  Shoulder flexion 4- (p! In shoulder)  4-  Shoulder extension 4- 4-  Shoulder abduction    Shoulder adduction    Shoulder extension    Shoulder internal rotation    Shoulder external rotation    Middle trapezius    Lower trapezius    Elbow flexion 4-/5 (pain) 4/5  Elbow extension 4/5 (pain) 4/5  Wrist flexion    Wrist extension    Wrist ulnar deviation    Wrist radial deviation    Wrist pronation    Wrist supination    Grip strength     (Blank rows = not tested)  LUMBAR ROM:   AROM eval 03/05/24:  Flexion 10% avail, * 10% avail, *   Extension 5% avail, barely any movement * 5% avail, barely any movement *  Right lateral flexion    Left lateral flexion    Right rotation    Left rotation     (Blank rows = not tested)   *=painful  LOWER EXTREMITY MMT:    MMT Right 02/11/24 Left 02/11/24 Right 03/05/24 Left 03/05/24  Hip flexion 3/5 3-/5    Hip extension   2/5 * 2/5*   Hip abduction 3-/5 3-/5  3-/5 *  Hip adduction      Hip internal rotation      Hip external rotation      Knee flexion 3/5 (pain) 3/5 3/5 * 3/5*  Knee extension 3+/5 3+/5    Ankle dorsiflexion 3+/5 3+/5    Ankle plantarflexion 3+/5 3+/5    Ankle inversion      Ankle eversion       (Blank rows = not tested) *= pain  CERVICAL SPECIAL TESTS:  None tested this date  FUNCTIONAL TESTS:  5 times sit to stand: 43s w. Hesitancy and decreased functional end range extension on standing 2 minute walk test: 274' w/ lateral excessive motion 30s STS: 4 reps with decreased tolerance and poor mechanics   03/06/23:  29ft w/ increased radicular symptoms down Rt LE  30s STS: 4 reps without HHA with decreased tolerance and poor mechanics   TREATMENT DATE:  03/05/24:  : 227ft w/ increased radicular symptoms down Rt LE 30s STS: 4 reps without HHA Cervical ROM see above Lumbar ROM see above MMT see above Modified Oswestry: 30/50 =60% MODIFIED OSWESTRY DISABILITY SCALE  Date: 9/26/5 Score  Pain intensity 3 =  Pain medication provides me with moderate relief from pain.  2. Personal care (washing, dressing, etc.) 3 =  I need help, but I am able to manage most of my personal care.  3. Lifting 4 = I can lift only very light weights  4. Walking 3 =  Pain prevents me from walking more than  mile.  5. Sitting 3 =  Pain prevents me from sitting more than  hour.  6. Standing 2 =  Pain prevents me from standing more than 1 hour  7. Sleeping 4 =  Even when I take pain medication, I sleep less than 2 hour  8. Social Life 3 =  Pain prevents me from going out very  often.  9. Traveling 4 = My pain restricts my travel to short necessary journeys under 1/2 hour.  10. Employment/ Homemaking 3 = Pain prevents me from doing anything but light duties.  Total 30/50   Interpretation of scores: Score Category Description  0-20% Minimal Disability The patient can cope with most living activities. Usually no treatment is indicated apart from advice on lifting, sitting and exercise  21-40% Moderate Disability The patient experiences more pain and difficulty with sitting, lifting and standing. Travel and social life are more difficult and they may be disabled from work. Personal care, sexual activity and sleeping are not grossly affected, and the patient can usually be managed by conservative means  41-60% Severe Disability Pain remains the main problem in this group, but activities of daily living are affected. These patients require a detailed investigation  61-80% Crippled Back pain impinges on all aspects of the patient's life. Positive intervention is required  81-100% Bed-bound  These patients are either bed-bound or exaggerating their symptoms  Bluford FORBES Zoe DELENA Karon DELENA, et al. Surgery versus conservative management of stable thoracolumbar fracture: the PRESTO feasibility RCT. [place] ([place]): [employer]; 2021 Nov. [facility] Technology Assessment, No. 25.62.) Appendix 3, Oswestry Disability Index category descriptors. Available from: FindJewel[employer].cz  Minimally Clinically Important Difference (MCID) = 12.8% NDI:  NECK DISABILITY INDEX  Date: 03/05/24 Score  Pain intensity 2 = The pain is moderate at the moment  2. Personal care (washing, dressing, etc.) 2 = It is painful to look after myself and I am slow and careful  3. Lifting 4 =  I can only lift very light weights  4. Reading 3 = I can't read as much as I want because of moderate pain in my neck  5. Headaches 1 =  I have slight headaches, which come infrequently   6. Concentration 2 = I have a fair degree of difficulty in concentrating when I want to  7. Work 2 = I can do most of my usual work, but no more  8. Driving 2 =  I can drive my car as long as I want with moderate pain in my neck  9. Sleeping 4 = My sleep is greatly disturbed (3-5 hrs sleepless)   10. Recreation 3 = I am able to engage in a few of my usual recreation activities because of pain in   my neck  Total 25/50   Minimum Detectable Change (90% confidence): 5 points or 10% points  03/03/24 Supine:   PPT 2X10 3 holds Bridge 2X10 3 holds Hip abduction with RTB 10X5 SLR 10X each Cervical retractions 10X3  Lower trunk rotations 10X3   02/27/2024  Manual Therapy: -CPA of Lumbar Spinal segments L3-L5, grade I-II mobilizations (pt unable to tolerate much pressure -STM of Lumbar Paraspinal musculature Therapeutic Exercise: -Supine bridges 1 set of 10 reps, 3 second holds, symptomatic, pt cued for max hip extension -Lateral stepping 3 laps 20 feet per lap, second 2 with RTB around ankles, pt cued for upright posture -Modified Crunch 1 set of 5 reps, pt cued for increased ROM, unable to place UE behind back due  to shoulder pain -Supine cervical retractions, pt cued for sequencing and 3 second holds, 1 set of 7 reps Neuromuscular Re-education: -PPT 1 set of 10 reps, 3 second holds, pr cued to remain in pain free ROM -LTR 2 set of 10 reps bilaterally, pt cued to remain in pain free ROM and for 3 second holds -Cat/cow stretch 1 set of 7 reps each direction, 3 second holds  02/26/24 NuStep, seat 7, level 1, 5 min, UE/LE [place] Supine: - Decompression 2-5 5x5 - Deep breathing x 1' with 1 hand stomach/1 on chest - TrA activation paired with exhalation x 2' - Posterior pelvic tilt 10x 5 - TrA contractoin paired with marching 10x 5 - Bridge 10x - Hamstring stretch 3x 30 with hands behind knees  02/16/24: NuStep, seat 7, level 1, 5 min PPT 10 holds x12 TA contraction +  SKTC/march, 10x TA contract + DKTC LE on physioball, 12x TA contract + LTR LE on physioball, 2x10 Supine Bridge, 10x Side-lying clamshells, 2x10 each side                                                                                                              PATIENT EDUCATION:  Education details: PT evaluation, objective findings, POC, Importance of HEP, Precautions, Clinic policies  Person educated: Patient Education method: Medical illustrator Education comprehension: verbalized understanding and returned demonstration  HOME EXERCISE PROGRAM: Access Code: [email or web address] URL: [email or web address] Date: 02/05/2024 Prepared by: Rosaria Powell-Butler  Exercises - Seated Assisted Cervical Rotation with Towel  - 2 x daily - 7 x weekly - 3 sets - 10 reps - Supine Posterior Pelvic Tilt  - 2 x daily - 7 x weekly - 3 sets - 10 reps - Supine Lower Trunk Rotation  - 2 x daily - 7 x weekly - 3 sets - 10 reps - Supine Cervical Flexion Extension on Pillow  - 2 x daily - 7 x weekly - 3 sets - 10 rep  Exercises - Supine Bridge  - 2 x daily - 7 x weekly - 2 sets - 10 reps - Supine Transversus Abdominis Bracing - Hands on Stomach  - 2 x daily - 7 x weekly - 2 sets - 10 reps - Clamshell  - 2 x daily - 7 x weekly - 2 sets - 10 reps  02/26/24:  -Decompression 2-5 - Hamstring stretch 3x 30  ASSESSMENT:  CLINICAL IMPRESSION:  Reviewed goals with the following findings:  Pt has met 1/2 STG and 0/5 LTG.  Pt reports compliance with HEP daily.  Objective testing and surveys reveal minimal changes since initial eval with improvements of 104ft during , ROM and strength are all the same.  Reviewed findings with pt who stated she feels minimal changes as well.  Educated purpose of PT and encouraged to return to MD as no changes noted.  Educated importance of continuing with exercises to assist with strength, verbalizued understanding.  Plan for DC to HEP.     (Initial) Patient is  a 63 y.o. female who was  seen today for physical therapy evaluation and treatment for neck and low back pain. On this date, patient demonstrates decreased self perceived function via NDI and Modified Oswestry, decreased cervical ROM, decreased lumbar ROM, poor posture, difficulty with functional transfers, and increased pain with movement. Throughout session, patient must change positions due to discomfort, and with functional testing, pt knees demo buckling at times due to increased catching pain with lumbar motions. Patient will benefit from skilled physical therapy in order to address the above deficits to improve current level of function and quality of life.    OBJECTIVE IMPAIRMENTS: Abnormal gait, decreased activity tolerance, decreased balance, decreased endurance, decreased mobility, difficulty walking, decreased ROM, decreased strength, impaired UE functional use, improper body mechanics, postural dysfunction, and pain.   ACTIVITY LIMITATIONS: carrying, lifting, bending, sitting, standing, squatting, stairs, transfers, bed mobility, and Driving  PARTICIPATION LIMITATIONS: meal prep, cleaning, laundry, driving, community activity, occupation, and yard work  PERSONAL FACTORS: N/A are also affecting patient's functional outcome.   REHAB POTENTIAL: Good  CLINICAL DECISION MAKING: Stable/uncomplicated  EVALUATION COMPLEXITY: Low   GOALS: Goals reviewed with patient? No  SHORT TERM GOALS: Target date: 02/26/24 Patient will be independent with performance of HEP to demonstrate adequate self management of symptoms.  Baseline: 03/05/24:  Reports compliance with HEP dailiy Goal status: MET  2.   Patient will report at least a 25% improvement with function and/or pain reduction overall since beginning PT. Baseline:  03/05/24:  Feels she has improved by 5%  Goal status: INITIAL    LONG TERM GOALS: Target date: 04/01/24  Patient will improve Modified Oswestry score by at least 12.8 %  in order to demonstrate improved self perceived function while meeting MCID. Baseline: 03/05/24: 60% was 56% Goal status: Not Met  2.  Patient will improve Neck Disability Index score by at least 5 points in order to demonstrate improved self perceived function while meeting MCID. Baseline: 03/05/24: 25/50=50% was 24 / 50 = 48.0 %  Goal status: Not met  3.  Patient will improve lumbar flexion and extension to achieve at least 25% of available motion in order to demonstrate improved lumabr mobility needed for functional ADLs and iADLs.  Baseline: no change Goal status: Not met  4.  Patient will improve cervical rotation to at least 60 degrees bilaterally in order to safely perform functional tasks like driving.  Baseline:  see above Goal status: Not met  5.  Patient will increase 2 minute walk test by at least 40 ft in order to demonstrate improved activity tolerance.  Baseline: 03/05/24:  Increased by 75ft, limited by pain Goal status:  Not met    PLAN:  PT FREQUENCY: 2x/week  PT DURATION: 8 weeks  PLANNED INTERVENTIONS: 97164- PT Re-evaluation, 97110-Therapeutic exercises, 97530- Therapeutic activity, 97112- Neuromuscular re-education, 97535- Self Care, 02859- Manual therapy, [id number]- Gait training, [phone]- Electrical stimulation (manual), [id number]- Traction (mechanical), 20560 (1-2 muscles), 20561 (3+ muscles)- Dry Needling, Patient/Family education, Balance training, Stair training, Taping, Joint mobilization, Spinal mobilization, Cryotherapy, and Moist heat  PLAN FOR NEXT SESSION: DC to HEP.  Augustin Mclean, LPTA/CLT; CBIS [phone]  4:53 PM, 03/05/24

## 2024-03-08 ENCOUNTER — Other Ambulatory Visit: Payer: Self-pay | Admitting: Orthopaedic Surgery

## 2024-03-08 ENCOUNTER — Telehealth: Payer: Self-pay | Admitting: Orthopaedic Surgery

## 2024-03-08 NOTE — Telephone Encounter (Signed)
 Dr. Janae Jacqueline [name] - Jacqueline [name] lvm stating she had gotten a call stating Dr. MARLA wanted her to have a cervical spine at [facility].  She wants to know if [facility] approved it.

## 2024-03-08 NOTE — Telephone Encounter (Signed)
 Pt is aware this has been approved.

## 2024-03-09 ENCOUNTER — Encounter (HOSPITAL_COMMUNITY): Admitting: Physical Therapy

## 2024-03-09 DIAGNOSIS — G8929 Other chronic pain: Secondary | ICD-10-CM | POA: Diagnosis not present

## 2024-03-09 DIAGNOSIS — Z79891 Long term (current) use of opiate analgesic: Secondary | ICD-10-CM | POA: Diagnosis not present

## 2024-03-09 DIAGNOSIS — M961 Postlaminectomy syndrome, not elsewhere classified: Secondary | ICD-10-CM | POA: Diagnosis not present

## 2024-03-09 DIAGNOSIS — M5416 Radiculopathy, lumbar region: Secondary | ICD-10-CM | POA: Diagnosis not present

## 2024-03-09 DIAGNOSIS — M48061 Spinal stenosis, lumbar region without neurogenic claudication: Secondary | ICD-10-CM | POA: Diagnosis not present

## 2024-03-11 ENCOUNTER — Encounter (HOSPITAL_COMMUNITY)

## 2024-03-11 DIAGNOSIS — M5416 Radiculopathy, lumbar region: Secondary | ICD-10-CM | POA: Diagnosis not present

## 2024-03-12 ENCOUNTER — Ambulatory Visit (HOSPITAL_COMMUNITY)
Admission: RE | Admit: 2024-03-12 | Discharge: 2024-03-12 | Disposition: A | Discharge status: Home / Self Care | Source: Ambulatory Visit | Attending: Orthopaedic Surgery | Admitting: Orthopaedic Surgery

## 2024-03-12 DIAGNOSIS — G8929 Other chronic pain: Secondary | ICD-10-CM | POA: Diagnosis not present

## 2024-03-12 DIAGNOSIS — M9971 Connective tissue and disc stenosis of intervertebral foramina of cervical region: Secondary | ICD-10-CM | POA: Diagnosis not present

## 2024-03-12 DIAGNOSIS — M25511 Pain in right shoulder: Secondary | ICD-10-CM | POA: Diagnosis not present

## 2024-03-12 DIAGNOSIS — M542 Cervicalgia: Secondary | ICD-10-CM | POA: Diagnosis not present

## 2024-03-12 DIAGNOSIS — M4602 Spinal enthesopathy, cervical region: Secondary | ICD-10-CM

## 2024-03-16 ENCOUNTER — Encounter (HOSPITAL_COMMUNITY)

## 2024-03-18 ENCOUNTER — Encounter (HOSPITAL_COMMUNITY): Admitting: Physical Therapy

## 2024-03-23 ENCOUNTER — Encounter (HOSPITAL_COMMUNITY): Admitting: Physical Therapy

## 2024-03-25 ENCOUNTER — Encounter (HOSPITAL_COMMUNITY)

## 2024-03-26 ENCOUNTER — Ambulatory Visit: Admitting: Orthopedic Surgery

## 2024-03-26 ENCOUNTER — Encounter: Payer: Self-pay | Admitting: Orthopedic Surgery

## 2024-03-26 DIAGNOSIS — M4722 Other spondylosis with radiculopathy, cervical region: Secondary | ICD-10-CM

## 2024-03-26 DIAGNOSIS — M4802 Spinal stenosis, cervical region: Secondary | ICD-10-CM | POA: Diagnosis not present

## 2024-03-26 NOTE — Progress Notes (Signed)
    03/26/2024   Chief Complaint  Patient presents with   Neck Pain   Results    Review C spine MRI     Encounter Diagnoses  Name Primary?   Foraminal stenosis of cervical region Yes   Cervical spondylosis with radiculopathy     What pharmacy do you use ? _____[facility] ______________________  DOI/DOS/ Date:    Unchanged     Taking Hydrocodone  / Oxycodone / not Tramadol  (constipation)  Taking Diclofenac  / Ibuprofen / Not Meloxicam  (has not picked up at pharmacy )

## 2024-03-26 NOTE — Patient Instructions (Addendum)
 Take Diclofenac  NOT the Meloxicam  or Ibuprofen  Take Lyrica Take Methocarbamol    Follow up with [facility]   They will need you to get a CD of the xray images from [facility]. The number to call for the CD is [phone] ask for [facility]

## 2024-03-26 NOTE — Progress Notes (Signed)
 FOLLOW-UP OFFICE VISIT   Patient: Jacqueline [name]           Date of Birth: 11-02-1960           MRN: [id number] Visit Date: 03/26/2024 Requested by: Edman Meade PEDLAR, FNP [address] [place],  [place] [address] PCP: Edman Meade PEDLAR, FNP    Encounter Diagnoses  Name Primary?   Foraminal stenosis of cervical region Yes   Cervical spondylosis with radiculopathy     Chief Complaint  Patient presents with   Neck Pain   Results    Review C spine MRI     Encounter Diagnoses  Name Primary?   Foraminal stenosis of cervical region Yes   Cervical spondylosis with radiculopathy      ASSESSMENT AND PLAN 63 year old female previously seen by Dr. Brenna.  I am taking over care and his retirement.  The patient complains of arm pain neck pain paresthesias weakness numbness and tingling and had an MRI  Her MRI was noted for the following The cervical spinal cord is normal.   C2-C3: Mild disc bulge, otherwise normal   C3-C4: There is mild degenerative disc disease with a mild disc bulge. No significant facet disease. There are small foraminal spurs. There is mild right neural foraminal stenosis   C4-C5: There is moderate degenerative disc disease with a mild disc bulge. No significant facet disease. There is a disc/foraminal spur on the right with moderate right neural foraminal stenosis   C5-C6: There is moderate degenerative disc disease with mild disc bulge. The facet joints are normal. There are small foraminal spurs. Mild bilateral foraminal stenosis   C6-C7: There is a mild disc bulge. The facet joints are normal. No spinal stenosis or foraminal stenosis   C7-T1: Normal   IMPRESSION: Multilevel mild-to-moderate degenerative changes.   There is a moderate right neural foraminal stenosis at C4-5 due to moderate degenerative disc disease with a right side foraminal spur.   No spinal stenosis or disc herniation.     Electronically Signed   By: Nancyann Burns  M.D.   On: 03/15/2024 10:20  Based on the MRI findings as stated I agree with the findings of moderate cervical spondylosis at multiple levels with right neuroforaminal stenosis secondary to disc disease   The patient will be referred to appropriate specialist  We advised her to stop the opioids and to start taking the following regiment diclofenac  Lyrica methocarbamol   She preferred to go back to [facility] as she is being seen by them for her lower back

## 2024-04-04 ENCOUNTER — Other Ambulatory Visit: Payer: Self-pay | Admitting: Orthopedic Surgery

## 2024-04-06 DIAGNOSIS — M542 Cervicalgia: Secondary | ICD-10-CM | POA: Diagnosis not present

## 2024-04-06 DIAGNOSIS — M48061 Spinal stenosis, lumbar region without neurogenic claudication: Secondary | ICD-10-CM | POA: Diagnosis not present

## 2024-04-06 DIAGNOSIS — M5416 Radiculopathy, lumbar region: Secondary | ICD-10-CM | POA: Diagnosis not present

## 2024-04-06 DIAGNOSIS — M961 Postlaminectomy syndrome, not elsewhere classified: Secondary | ICD-10-CM | POA: Diagnosis not present

## 2024-04-06 DIAGNOSIS — G8929 Other chronic pain: Secondary | ICD-10-CM | POA: Diagnosis not present

## 2024-04-06 DIAGNOSIS — Z79891 Long term (current) use of opiate analgesic: Secondary | ICD-10-CM | POA: Diagnosis not present

## 2024-04-12 ENCOUNTER — Encounter: Payer: Self-pay | Admitting: Radiology

## 2024-04-14 ENCOUNTER — Ambulatory Visit: Payer: Self-pay

## 2024-04-14 VITALS — BP 101/66 | HR 93 | Ht 62.0 in | Wt 182.0 lb

## 2024-04-14 DIAGNOSIS — R21 Rash and other nonspecific skin eruption: Secondary | ICD-10-CM | POA: Diagnosis not present

## 2024-04-14 MED ORDER — CLOTRIMAZOLE-BETAMETHASONE 1-0.05 % EX CREA
1.0000 | TOPICAL_CREAM | Freq: Every day | CUTANEOUS | 2 refills | Status: AC
Start: 2024-04-14 — End: ?

## 2024-04-14 NOTE — Progress Notes (Signed)
 Established Patient Office Visit  Subjective   Patient ID: Jacqueline [name], female    DOB: Sep 11, 1960  Age: 63 y.o. MRN: [id number]  Chief Complaint  Patient presents with   Medical Management of Chronic Issues    Pt here for left leg rash and body aches    HPI  Discussed the use of AI scribe software for clinical note transcription with the patient, who gave verbal consent to proceed.  History of Present Illness    Jacqueline [name] is a 63 year old female with scoliosis and degenerative disc disease who presents with a recurrent rash and worsening musculoskeletal pain.  Cutaneous rash - Recurrent rash primarily located on the leg, present intermittently for several years - Rash is spreading and becoming more extensive over time - Not painful but can be itchy, especially when washed - Minimal relief with previously prescribed steroid cream - Aloe and moisturizing lotions have not provided significant improvement - Occasional involvement of the face  Musculoskeletal pain and mobility impairment - Persistent pain in the neck, arms, back, buttocks, and legs - Legs sometimes 'give out', resulting in difficulty with mobility, especially when getting up in the morning or after sitting - Progressive worsening of symptoms despite physical therapy and multiple injections - History of scoliosis with surgical intervention in 2021 - History of degenerative disc disease and cervical spinal stenosis confirmed by EMG and MRI - Arm pain and occasional loss of balance - Work as a conservation officer, nature requires standing and lifting, which exacerbates pain  Lower extremity arthropathy and post-surgical symptoms - History of arthritis in the leg - Surgical repair of torn meniscus and ligament last year - Leg sometimes cramps and makes noise, which is noticeable to others  Sleep apnea - Previously diagnosed and treated for sleep apnea - Frustration with the need to repurchase equipment      Patient Active  Problem List   Diagnosis Date Noted   Lumbosacral radiculopathy 01/21/2024   Vaginal irritation 11/29/2023   Situational anxiety 11/29/2023   Elevated LFTs 08/26/2023   Viral illness 08/06/2023   Epigastric pain 07/07/2023   S/P arthroscopy of left knee debridement of ACL/ lateral meniscectomy 12/25/22 01/02/2023   Bucket-handle tear of lateral meniscus of left knee as current injury 12/25/2022   Left anterior cruciate ligament tear 12/25/2022   Acute non-recurrent maxillary sinusitis 10/26/2022   Mucopurulent conjunctivitis of both eyes 10/26/2022   Acute bronchitis 10/25/2022   Maxillary sinusitis, acute 10/25/2022   Conjunctivitis 10/25/2022   Neck pain, chronic 08/28/2022   Sleep apnea 08/28/2022   Need for immunization against influenza 02/28/2022   Screening for cervical cancer 12/21/2021   H. pylori infection 12/05/2021   Snoring 11/23/2021   Insomnia 11/23/2021   Annual physical exam 10/16/2021   Dermatitis 10/16/2021   Need for varicella vaccine 10/16/2021   Diarrhea 07/05/2021   Stomach cramps 07/05/2021   Nausea & vomiting 07/05/2021   Encounter for vaccination 07/05/2021   Right foot pain 05/16/2021   Dysphagia 05/01/2021   Right shoulder pain 12/27/2020   Rash and nonspecific skin eruption 12/13/2020   Chronic back pain 04/04/2020   History of back surgery 04/04/2020   Radiculopathy 07/28/2019   Borderline diabetes 12/25/2018   Hypertension 06/30/2018   S/P abdominal supracervical subtotal hysterectomy 11/06/2016   Trigger middle finger of right hand 04/24/2016   GERD (gastroesophageal reflux disease) 08/04/2014   Shoulder strain 07/26/2014   Degenerative arthritis of thumb 07/26/2014   Left knee pain 02/28/2014  Hip pain 04/15/2013   Epicondylitis, lateral (tennis elbow) 04/13/2013   Constipation 09/15/2012   Hyperlipidemia 11/28/2011   Obesity, Class I, BMI 30.0-34.9 (see actual BMI) 09/15/2011   ALLERGIC RHINITIS, SEASONAL 11/13/2007    ROS     Objective:     BP 101/66   Pulse 93   Ht 5' 2 (1.575 m)   Wt 182 lb (82.6 kg)   LMP 10/26/2016 (Exact Date)   SpO2 97%   BMI 33.29 kg/m  BP Readings from Last 3 Encounters:  04/14/24 101/66  01/16/24 121/76  11/27/23 111/74   Wt Readings from Last 3 Encounters:  04/14/24 182 lb (82.6 kg)  11/27/23 182 lb 1.9 oz (82.6 kg)  11/26/23 181 lb 12.8 oz (82.5 kg)     Physical Exam Vitals and nursing note reviewed.  Constitutional:      Appearance: Normal appearance.  HENT:     Head: Normocephalic.     Right Ear: Tympanic membrane, ear canal and external ear normal.     Left Ear: Tympanic membrane, ear canal and external ear normal.     Nose: Nose normal.     Mouth/Throat:     Mouth: Mucous membranes are moist.     Pharynx: Oropharynx is clear.  Cardiovascular:     Rate and Rhythm: Normal rate and regular rhythm.  Pulmonary:     Effort: Pulmonary effort is normal.     Breath sounds: Normal breath sounds.  Musculoskeletal:     Cervical back: Neck supple. Pain with movement and spinous process tenderness present. Decreased range of motion.  Skin:    General: Skin is warm and dry.  Neurological:     Mental Status: She is alert and oriented to person, place, and time.  Psychiatric:        Mood and Affect: Mood normal.        Thought Content: Thought content normal.      No results found for any visits on 04/14/24.  Last CBC Lab Results  Component Value Date   WBC 6.0 07/14/2023   HGB 12.0 07/14/2023   HCT 36.8 07/14/2023   MCV 95 07/14/2023   MCH 30.8 07/14/2023   RDW 14.4 07/14/2023   PLT 254 07/14/2023   Last metabolic panel Lab Results  Component Value Date   GLUCOSE 81 07/14/2023   NA 136 07/14/2023   K 4.2 07/14/2023   CL 100 07/14/2023   CO2 24 07/14/2023   BUN 12 07/14/2023   CREATININE 0.93 07/14/2023   EGFR 70 07/14/2023   CALCIUM  9.6 07/14/2023   PROT 7.0 08/29/2023   ALBUMIN  4.3 08/29/2023   LABGLOB 3.3 07/14/2023   AGRATIO 1.4  09/06/2022   BILITOT 0.8 08/29/2023   ALKPHOS 129 (H) 08/29/2023   AST 93 (H) 08/29/2023   ALT 121 (H) 08/29/2023   ANIONGAP 8 12/24/2022   Last lipids Lab Results  Component Value Date   CHOL 158 07/14/2023   HDL 34 (L) 07/14/2023   LDLCALC 105 (H) 07/14/2023   TRIG 103 07/14/2023   CHOLHDL 4.6 (H) 07/14/2023   Last hemoglobin A1c Lab Results  Component Value Date   HGBA1C 5.4 07/14/2023   Last thyroid  functions Lab Results  Component Value Date   TSH 0.928 07/14/2023   FREET4 1.17 07/14/2023   Last vitamin D  Lab Results  Component Value Date   VD25OH 50.9 07/14/2023   Last vitamin B12 and Folate Lab Results  Component Value Date   VITAMINB12 996 (H) 02/28/2014  The 10-year ASCVD risk score (Arnett DK, et al., 2019) is: 3.9%    Assessment & Plan:   Problem List Items Addressed This Visit       Musculoskeletal and Integument   Rash and nonspecific skin eruption - Primary   Recurrent rash on left leg and face, treated with steroid cream with minimal relief. Rash spreading, occasionally itchy. Differential includes eczema and atypical shingles. - Prescribed a different cream. - Sent prescription to pharmacy.      Relevant Medications   clotrimazole -betamethasone  (LOTRISONE ) cream    No follow-ups on file.    Leita Longs, FNP

## 2024-04-19 NOTE — Assessment & Plan Note (Signed)
 Recurrent rash on left leg and face, treated with steroid cream with minimal relief. Rash spreading, occasionally itchy. Differential includes eczema and atypical shingles. - Prescribed a different cream. - Sent prescription to pharmacy.

## 2024-04-26 ENCOUNTER — Ambulatory Visit: Payer: Self-pay

## 2024-04-26 NOTE — Telephone Encounter (Signed)
 FYI Only or Action Required?: Action required by provider: request for appointment.  Patient was last seen in primary care on 04/14/2024 by Bevely Doffing, FNP.  Called Nurse Triage reporting No chief complaint on file..  Symptoms began several months ago.  Interventions attempted: Rest, hydration, or home remedies and Other: Physical Therapy.  Symptoms are: gradually worsening.  Triage Disposition: See HCP Within 4 Hours (Or PCP Triage)  Patient/caregiver understands and will follow disposition?: No, wishes to speak with PCP  Copied from CRM #[id number]. Topic: Clinical - Red Word Triage >> Apr 26, 2024  2:09 PM Joesph NOVAK wrote: Red Word that prompted transfer to Nurse Triage: Back pain and arthritis in neck. Patient states her legs are giving out because of her back. Reason for Disposition  [1] Pain radiates into the thigh or further down the leg AND [2] both legs  Answer Assessment - Initial Assessment Questions The patient is requesting instructions in regards to continuing physical therapy if it remains ineffective. The patient is asking for communication from the clinic and to be contacted at [phone].     1. ONSET: When did the pain begin? (e.g., minutes, hours, days)     Chronic  2. LOCATION: Where does it hurt? (upper, mid or lower back)     Generalized   3. SEVERITY: How bad is the pain?  (e.g., Scale 1-10; mild, moderate, or severe)     Moderate to Severe  4. PATTERN: Is the pain constant? (e.g., yes, no; constant, intermittent)      Constant  5. RADIATION: Does the pain shoot into your legs or somewhere else?     Legs  6. CAUSE:  What do you think is causing the back pain?      Unsure  7. BACK OVERUSE:  Any recent lifting of heavy objects, strenuous work or exercise?     No  8. MEDICINES: What have you taken so far for the pain? (e.g., nothing, acetaminophen , NSAIDS)     Yes, did not elicit information  9. NEUROLOGIC SYMPTOMS: Do you have  any weakness, numbness, or problems with bowel/bladder control?     Weakness (Legs, Hips, Arms)  10. OTHER SYMPTOMS: Do you have any other symptoms? (e.g., fever, abdomen pain, burning with urination, blood in urine)       Denies  11. PREGNANCY: Is there any chance you are pregnant? When was your last menstrual period?       No and No  Protocols used: Back Pain-A-AH

## 2024-04-28 DIAGNOSIS — H40003 Preglaucoma, unspecified, bilateral: Secondary | ICD-10-CM | POA: Diagnosis not present

## 2024-05-03 ENCOUNTER — Other Ambulatory Visit: Payer: Self-pay | Admitting: Orthopedic Surgery

## 2024-05-04 ENCOUNTER — Ambulatory Visit: Payer: Self-pay

## 2024-05-04 DIAGNOSIS — M542 Cervicalgia: Secondary | ICD-10-CM | POA: Diagnosis not present

## 2024-05-04 DIAGNOSIS — G8929 Other chronic pain: Secondary | ICD-10-CM | POA: Diagnosis not present

## 2024-05-04 DIAGNOSIS — M961 Postlaminectomy syndrome, not elsewhere classified: Secondary | ICD-10-CM | POA: Diagnosis not present

## 2024-05-04 DIAGNOSIS — M48061 Spinal stenosis, lumbar region without neurogenic claudication: Secondary | ICD-10-CM | POA: Diagnosis not present

## 2024-05-04 DIAGNOSIS — Z79891 Long term (current) use of opiate analgesic: Secondary | ICD-10-CM | POA: Diagnosis not present

## 2024-05-04 DIAGNOSIS — M5416 Radiculopathy, lumbar region: Secondary | ICD-10-CM | POA: Diagnosis not present

## 2024-05-04 NOTE — Telephone Encounter (Signed)
 No appointments available with PCP in the next two weeks and patient wants to see her PCP Jacqueline Gerlach FNP to discuss paperwork to have her taken out of work due to chronic pain--she states nothing is different with any of her symptoms  FYI Only or Action Required?: Action required by provider: clinical question for provider, update on patient condition, request for documentation or forms, and patient wants to see her pcp for paperwork to be able to be taken out of work due to chronic pain.  Patient was last seen in primary care on 04/14/2024 by Jacqueline Doffing, FNP.  Called Nurse Triage reporting Pain.  Symptoms began Patient states chronic symptoms.  Interventions attempted: OTC medications: ibuprofen , Prescription medications: pregabalin, methocarbamol , and Rest, hydration, or home remedies.  Symptoms are: unchanged.  Triage Disposition: See PCP Within 2 Weeks  Patient/caregiver understands and will follow disposition?:              Copied from CRM #[id number]. Topic: Clinical - Red Word Triage >> May 04, 2024 10:12 AM Jacqueline [name] wrote: pain every everywhere. back, legs, neck right side, back pain. Pt does have a history of chronic pian Reason for Disposition  Back pain is a chronic symptom (recurrent or ongoing AND present > 4 weeks)  Answer Assessment - Initial Assessment Questions Chronic pain per patient---not getting any better Patient went to physical therapy in September Patient states that working at her part time job is getting more difficult  Patient wants to speak with PCP Jacqueline [name] about paperwork to take her out of work.  Patient is advised to call us  back if anything changes or with any further questions/concerns. Patient is advised that if anything worsens to go to the Emergency Room. Patient verbalized understanding.     1. ONSET: When did the pain begin? (e.g., minutes, hours, days)     chronic 2. LOCATION: Where does it hurt? (upper, mid or  lower back)     Neck and lower back 3. SEVERITY: How bad is the pain?  (e.g., Scale 1-10; mild, moderate, or severe)     chronic 7. BACK OVERUSE:  Any recent lifting of heavy objects, strenuous work or exercise?     Daily use 8. MEDICINES: What have you taken so far for the pain? (e.g., nothing, acetaminophen , NSAIDS)     Pregabalin, methocarbamol   Protocols used: Back Pain-A-AH

## 2024-05-12 ENCOUNTER — Other Ambulatory Visit (HOSPITAL_COMMUNITY): Payer: Self-pay | Admitting: Orthopedic Surgery

## 2024-05-12 DIAGNOSIS — M5459 Other low back pain: Secondary | ICD-10-CM

## 2024-05-21 DIAGNOSIS — Z419 Encounter for procedure for purposes other than remedying health state, unspecified: Secondary | ICD-10-CM | POA: Diagnosis not present

## 2024-05-25 ENCOUNTER — Ambulatory Visit (HOSPITAL_COMMUNITY)
Admission: RE | Admit: 2024-05-25 | Discharge: 2024-05-25 | Attending: Orthopedic Surgery | Admitting: Orthopedic Surgery

## 2024-05-25 ENCOUNTER — Other Ambulatory Visit: Payer: Self-pay | Admitting: Family Medicine

## 2024-05-25 DIAGNOSIS — M5136 Other intervertebral disc degeneration, lumbar region with discogenic back pain only: Secondary | ICD-10-CM | POA: Diagnosis not present

## 2024-05-25 DIAGNOSIS — M47816 Spondylosis without myelopathy or radiculopathy, lumbar region: Secondary | ICD-10-CM | POA: Diagnosis not present

## 2024-05-25 DIAGNOSIS — M5459 Other low back pain: Secondary | ICD-10-CM | POA: Diagnosis not present

## 2024-05-25 DIAGNOSIS — E7849 Other hyperlipidemia: Secondary | ICD-10-CM

## 2024-05-25 DIAGNOSIS — M4316 Spondylolisthesis, lumbar region: Secondary | ICD-10-CM | POA: Diagnosis not present

## 2024-05-25 DIAGNOSIS — M48061 Spinal stenosis, lumbar region without neurogenic claudication: Secondary | ICD-10-CM | POA: Diagnosis not present

## 2024-05-26 NOTE — Telephone Encounter (Signed)
 Spoke with pt she's keeping the virtual appt.

## 2024-05-27 ENCOUNTER — Encounter (HOSPITAL_COMMUNITY): Payer: Self-pay

## 2024-05-27 ENCOUNTER — Ambulatory Visit (HOSPITAL_COMMUNITY)

## 2024-05-27 DIAGNOSIS — Z7409 Other reduced mobility: Secondary | ICD-10-CM | POA: Diagnosis present

## 2024-05-27 DIAGNOSIS — M542 Cervicalgia: Secondary | ICD-10-CM | POA: Diagnosis present

## 2024-05-27 DIAGNOSIS — G8929 Other chronic pain: Secondary | ICD-10-CM | POA: Diagnosis not present

## 2024-05-27 DIAGNOSIS — M5459 Other low back pain: Secondary | ICD-10-CM | POA: Diagnosis present

## 2024-05-27 DIAGNOSIS — M5489 Other dorsalgia: Secondary | ICD-10-CM | POA: Diagnosis not present

## 2024-05-27 NOTE — Therapy (Signed)
 OUTPATIENT PHYSICAL THERAPY THORACOLUMBAR EVALUATION   Patient Name: Jacqueline [name] MRN: [id number] DOB:1960-08-08, 63 y.o., female Today's Date: 05/27/2024  END OF SESSION:  PT End of Session - 05/27/24 1555     Visit Number 1    Number of Visits 12    Date for Recertification  08/06/24    Authorization Type Medicaid [employer]    Authorization Time Period seekining authorization    PT Start Time 1556   Patient arrived at the wrong time for her appointment.   PT Stop Time 1630    PT Time Calculation (min) 34 min    Activity Tolerance Patient limited by pain    Behavior During Therapy [facility] for tasks assessed/performed          Past Medical History:  Diagnosis Date   Anemia    Arthritis    per patient, back   Complication of anesthesia 2018   per patient HR dropped during partial hysterectomy surgery   Fibroid uterus    GERD (gastroesophageal reflux disease)    H/O total hysterectomy    Heart murmur    Hyperlipidemia    Borderline   Hypertension    Numbness and tingling in right hand    Pre-diabetes    borderline   Sleep apnea    Past Surgical History:  Procedure Laterality Date   BACK SURGERY     BALLOON DILATION N/A 05/22/2021   Procedure: BALLOON DILATION;  Surgeon: Cindie Carlin POUR, DO;  [facility];  Service: Endoscopy;  Laterality: N/A;   BIOPSY  11/18/2022   Procedure: BIOPSY;  Surgeon: Shaaron Lamar HERO, MD;  [facility];  Service: Endoscopy;;   CATARACT EXTRACTION W/PHACO Left 06/27/2023   Procedure: CATARACT EXTRACTION PHACO AND INTRAOCULAR LENS PLACEMENT (IOC);  Surgeon: Harrie Agent, MD;  Location: [facility];  Service: Ophthalmology;  Laterality: Left;  CDE: 3.56   CATARACT EXTRACTION W/PHACO Right 07/11/2023   Procedure: CATARACT EXTRACTION PHACO AND INTRAOCULAR LENS PLACEMENT (IOC);  Surgeon: Harrie Agent, MD;  Location: [facility];  Service: Ophthalmology;  Laterality: Right;  CDE: 2.82   CHOLECYSTECTOMY     COLONOSCOPY  2003   Dr.  Shaaron: Anal canal hemorrhoids   COLONOSCOPY N/A 08/25/2014   Normal colonoscopy.  Next colonoscopy in 2026.  Procedure: COLONOSCOPY;  Surgeon: Lamar HERO Shaaron, MD;  [facility];  Service: Endoscopy;  Laterality: N/A;  1200   ESOPHAGOGASTRODUODENOSCOPY N/A 08/25/2014   Procedure: ESOPHAGOGASTRODUODENOSCOPY (EGD);  Surgeon: Lamar HERO Shaaron, MD;  [facility];  Service: Endoscopy;  Laterality: N/A;   ESOPHAGOGASTRODUODENOSCOPY (EGD) WITH PROPOFOL  N/A 05/22/2021   Web and distal esophagus, dilated, gastriti, biopsy positive for mildly active H. pylori gastritis.  s procedure: ESOPHAGOGASTRODUODENOSCOPY (EGD) WITH PROPOFOL ;  Surgeon: Cindie Carlin POUR, DO;  [facility];  Service: Endoscopy;  Laterality: N/A;  9:15am   ESOPHAGOGASTRODUODENOSCOPY (EGD) WITH PROPOFOL  N/A 11/18/2022   Procedure: ESOPHAGOGASTRODUODENOSCOPY (EGD) WITH PROPOFOL ;  Surgeon: Shaaron Lamar HERO, MD;  [facility];  Service: Endoscopy;  Laterality: N/A;  2:15 pm, asa 2   KNEE ARTHROSCOPY WITH MEDIAL MENISECTOMY Left 12/25/2022   Procedure: KNEE ARTHROSCOPY WITH PARTIAL LATERAL MENISCECTOMY DEBRIDEMENT ACL;  Surgeon: Margrette Taft BRAVO, MD;  Location: [facility];  Service: Orthopedics;  Laterality: Left;   MALONEY DILATION N/A 11/18/2022   Procedure: AGAPITO DILATION;  Surgeon: Shaaron Lamar HERO, MD;  [facility];  Service: Endoscopy;  Laterality: N/A;   SALPINGOOPHORECTOMY Bilateral 11/06/2016   Procedure: BILATERAL  SALPINGO OOPHORECTOMY;  Surgeon: Jayne Vonn DEL, MD;  Location: [facility];  Service: Gynecology;  Laterality: Bilateral;   SUPRACERVICAL ABDOMINAL HYSTERECTOMY N/A 11/06/2016   Procedure: HYSTERECTOMY SUPRACERVICAL ABDOMINAL;  Surgeon: Jayne Vonn DEL, MD;  Location: [facility];  Service: Gynecology;  Laterality: N/A;   TRANSFORAMINAL LUMBAR INTERBODY FUSION (TLIF) WITH PEDICLE SCREW FIXATION 2 LEVEL Left 07/28/2019   Procedure: LEFT-SIDED LUMBAR 4- 5, LUMBAR 5 -SACRUM1  TRANSFORAMINAL LUMBAR INTERBODY FUSION WITH INSTRUMENTATION AND ALLOGRAFT;  Surgeon: Beuford Anes, MD;  Location: [facility];  Service: Orthopedics;  Laterality: Left;   TUBAL LIGATION     Patient Active Problem List   Diagnosis Date Noted   Lumbosacral radiculopathy 01/21/2024   Vaginal irritation 11/29/2023   Situational anxiety 11/29/2023   Elevated LFTs 08/26/2023   Viral illness 08/06/2023   Epigastric pain 07/07/2023   S/P arthroscopy of left knee debridement of ACL/ lateral meniscectomy 12/25/22 01/02/2023   Bucket-handle tear of lateral meniscus of left knee as current injury 12/25/2022   Left anterior cruciate ligament tear 12/25/2022   Acute non-recurrent maxillary sinusitis 10/26/2022   Mucopurulent conjunctivitis of both eyes 10/26/2022   Acute bronchitis 10/25/2022   Maxillary sinusitis, acute 10/25/2022   Conjunctivitis 10/25/2022   Neck pain, chronic 08/28/2022   Sleep apnea 08/28/2022   Need for immunization against influenza 02/28/2022   Screening for cervical cancer 12/21/2021   H. pylori infection 12/05/2021   Snoring 11/23/2021   Insomnia 11/23/2021   Annual physical exam 10/16/2021   Dermatitis 10/16/2021   Need for varicella vaccine 10/16/2021   Diarrhea 07/05/2021   Stomach cramps 07/05/2021   Nausea & vomiting 07/05/2021   Encounter for vaccination 07/05/2021   Right foot pain 05/16/2021   Dysphagia 05/01/2021   Right shoulder pain 12/27/2020   Rash and nonspecific skin eruption 12/13/2020   Chronic back pain 04/04/2020   History of back surgery 04/04/2020   Radiculopathy 07/28/2019   Borderline diabetes 12/25/2018   Hypertension 06/30/2018   S/P abdominal supracervical subtotal hysterectomy 11/06/2016   Trigger middle finger of right hand 04/24/2016   GERD (gastroesophageal reflux disease) 08/04/2014   Shoulder strain 07/26/2014   Degenerative arthritis of thumb 07/26/2014   Left knee pain 02/28/2014   Hip pain 04/15/2013   Epicondylitis, lateral  (tennis elbow) 04/13/2013   Constipation 09/15/2012   Hyperlipidemia 11/28/2011   Obesity, Class I, BMI 30.0-34.9 (see actual BMI) 09/15/2011   ALLERGIC RHINITIS, SEASONAL 11/13/2007    PCP: Edman Meade PEDLAR, FNP   REFERRING PROVIDER: Beuford Anes, MD   REFERRING DIAG: neck and low back pain   Rationale for Evaluation and Treatment: Rehabilitation  THERAPY DIAG:  Other low back pain  Neck pain  Impaired functional mobility, balance, gait, and endurance  ONSET DATE: 2017  SUBJECTIVE:  SUBJECTIVE STATEMENT: Patient reports that her back has been hurting since she fell backward in 2017 and then she had surgery on her back in 2021. She is having pain in her back radiating down both legs into both thighs. She feels that her pain has been getting worse recently. She had a CT on her spine yesterday. She has tried injections and epidurals, but these have not helped. She has a back brace, but she does not wear this much due to it being big and bulky. She also has neck and arm pain.   PERTINENT HISTORY:  OA, prior back surgery, and HTN  PAIN:  Are you having pain? Yes: NPRS scale: Current: 7/10 Best: 7/10 Worst: 9/10 Pain location: low back and both thighs  Pain description: aching and throbbing Aggravating factors: bending, twisting, and lifting Relieving factors: none known   PRECAUTIONS: None  RED FLAGS: None   WEIGHT BEARING RESTRICTIONS: No  FALLS:  Has patient fallen in last 6 months? No, but has multiple close calls.   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  DIAGNOSTIC FINDINGS: 05/27/24 lumbar CT scan  Exam results not yet released  PATIENT SURVEYS:  Modified Oswestry:  MODIFIED OSWESTRY DISABILITY SCALE  Date: 05/27/24 Score  Pain intensity 4 =  Pain medication provides me with little relief from pain.  2. Personal care (washing, dressing, etc.) 2 =  It is painful to take care of myself, and I am slow and careful.  3. Lifting 4 = I can lift only very light weights  4. Walking 3 =  Pain prevents me from walking more than  mile.  5. Sitting 3 =  Pain prevents me from sitting more than  hour.  6. Standing 4 =  Pain prevents me from standing more than 10 minutes.  7. Sleeping 3 =  Even when I take pain medication, I sleep less than 4 hours.  8. Social Life 3 =  Pain prevents me from going out very often.  9. Traveling 4 = My pain restricts my travel to short necessary journeys under 1/2 hour.  10. Employment/ Homemaking 3 = Pain prevents me from doing anything but light duties.  Total 33/50   Interpretation of scores: Score Category Description  0-20% Minimal Disability The patient can cope with most living activities. Usually no treatment is indicated apart from advice on lifting, sitting and exercise  21-40% Moderate Disability The patient experiences more pain and difficulty with sitting, lifting and standing. Travel and social life are more difficult and they may be disabled from work. Personal care, sexual activity and sleeping are not grossly affected, and the patient can usually be managed by conservative means  41-60% Severe Disability Pain remains the main problem in this group, but activities of daily living are affected. These patients require a detailed investigation  61-80% Crippled Back pain  impinges on all aspects of the patients life. Positive intervention is required  81-100% Bed-bound These patients are either bed-bound or exaggerating their symptoms  Bluford BRAVO, Zoe DELENA Karon DELENA, et  al. Surgery versus conservative management of stable thoracolumbar fracture: the PRESTO feasibility RCT. [place] ([place]): [employer]; 2021 Nov. [facility] Technology Assessment, No. 25.62.) Appendix 3, Oswestry Disability Index category descriptors. Available from: Findjewelers.cz  Minimally Clinically Important Difference (MCID) = 12.8%  COGNITION: Overall cognitive status: Within functional limits for tasks assessed     SENSATION: Patient reports intermittent lower extremity tingling, but none currently  POSTURE: forward head and decreased lumbar lordosis  PALPATION: Unable to assess due to significant subjective history and time constraints  LUMBAR ROM:   AROM eval  Flexion 75% limited; familiar pain   Extension 100% limited; familiar pain  Right lateral flexion   Left lateral flexion   Right rotation 90% limited; familiar pain   Left rotation 90% limited; familiar pain   (Blank rows = not tested)  LOWER EXTREMITY ROM:  WFL for activities assessed  LOWER EXTREMITY MMT:    MMT Right eval Left eval  Hip flexion 4/5; slight pain  4/5  Hip extension    Hip abduction    Hip adduction    Hip internal rotation    Hip external rotation    Knee flexion 4+/5 4+/5  Knee extension 4+/5; familiar pain  4+/5; dull pain   Ankle dorsiflexion 3+/5 3+/5  Ankle plantarflexion    Ankle inversion    Ankle eversion     (Blank rows = not tested)  LUMBAR SPECIAL TESTS:  Unable to be assessed at this time due to significant subjective history and time constraints  FUNCTIONAL TESTS:  5 times sit to stand: to be assessed at next visit, as able  Timed up and go (TUG): to be assessed at next visit, as able 2 minute walk test: to be assessed at next  visit, as able  GAIT: Assistive device utilized: None Level of assistance: Complete Independence Comments: trendelenburg, decreased gait speed and stride length   TREATMENT DATE:                                                                                                                               05/27/24: PT evaluation and education    PATIENT EDUCATION:  Education details: POC, prognosis, healing, objective findings, and plan for the next visit Person educated: Patient Education method: Explanation Education comprehension: verbalized understanding  HOME EXERCISE PROGRAM: Unable to be provided at initial evaluation  ASSESSMENT:  CLINICAL IMPRESSION: Patient is a 63 y.o. female who was seen today for physical therapy evaluation and treatment for chronic neck and low back pain. Today's evaluation was isolated to her lumbar symptoms due to their severity and irritability and time constraints. She presented with high pain severity and irritability with lumbar AROM reproducing her familiar symptoms. She exhibited significant AROM deficits with pain being her primary limiting factor. Recommend that she continue with skilled physical therapy to address her impairments to maximize her safety and functional mobility.   OBJECTIVE IMPAIRMENTS: Abnormal gait, decreased activity tolerance, decreased balance,  decreased mobility, difficulty walking, decreased ROM, decreased strength, hypomobility, impaired tone, postural dysfunction, and pain.   ACTIVITY LIMITATIONS: carrying, lifting, bending, sitting, standing, squatting, sleeping, stairs, transfers, bathing, toileting, dressing, hygiene/grooming, and locomotion level  PARTICIPATION LIMITATIONS: cleaning, driving, shopping, community activity, occupation, and yard work  PERSONAL FACTORS: Past/current experiences, Time since onset of injury/illness/exacerbation, and 3+ comorbidities: OA, prior back surgery, and HTN are also affecting  patient's functional outcome.   REHAB POTENTIAL: Fair    CLINICAL DECISION MAKING: Evolving/moderate complexity  EVALUATION COMPLEXITY: Moderate   GOALS: Goals reviewed with patient? No  SHORT TERM GOALS: Target date: 06/17/24  Patient will be independent with her initial HEP.  Baseline: Goal status: INITIAL  2.  Patient will report at least a 30% improvement with physical therapy.  Baseline:  Goal status: INITIAL  3.  Patient will be able to demonstrate proper lifting mechanics for improved function with her yard work.  Baseline:  Goal status: INITIAL  LONG TERM GOALS: Target date: 07/08/24  Patient will be independent with her advanced HEP.  Baseline:  Goal status: INITIAL  2.  Patient will report at least a 60% improvement with physical therapy.  Baseline:  Goal status: INITIAL  3.  Patient will be able to complete her daily activities without her familiar symptoms exceeding 6/10. Baseline:  Goal status: INITIAL  4.  Patient will improve her ODI score to 25/50 or less for improved perceived function with her daily activities.  Baseline:  Goal status: INITIAL  PLAN:  PT FREQUENCY: 2x/week  PT DURATION: 6 weeks  PLANNED INTERVENTIONS: 97164- PT Re-evaluation, 97750- Physical Performance Testing, 97110-Therapeutic exercises, 97530- Therapeutic activity, [id number]- Neuromuscular re-education, 97535- Self Care, [id number]- Manual therapy, [phone]- Gait training, [phone]- Electrical stimulation (unattended), [phone]- Traction (mechanical), [phone] (1-2 muscles), [id number] (3+ muscles)- Dry Needling, Patient/Family education, Balance training, Stair training, Taping, Joint mobilization, Spinal mobilization, Cryotherapy, and Moist heat.  PLAN FOR NEXT SESSION: cervical assessment, functional testing, provide HEP, and isometrics    Lacinda JAYSON Fass, PT 2024-05-29, 6:18 PM    Managed Medicaid Authorization Request Treatment Start Date: 2024-05-29  Visit Dx Codes: M54.59, M54.2, Z74.09    Functional Tool Score: ODI: 33/50  For all possible CPT codes, reference the Planned Interventions line above.     Check all conditions that are expected to impact treatment: {Conditions expected to impact treatment:None of these apply   If treatment provided at initial evaluation, no treatment charged due to lack of authorization.

## 2024-05-28 ENCOUNTER — Ambulatory Visit (HOSPITAL_COMMUNITY)

## 2024-05-31 ENCOUNTER — Telehealth (INDEPENDENT_AMBULATORY_CARE_PROVIDER_SITE_OTHER): Admitting: Family Medicine

## 2024-05-31 DIAGNOSIS — F411 Generalized anxiety disorder: Secondary | ICD-10-CM | POA: Diagnosis not present

## 2024-06-01 DIAGNOSIS — Z79891 Long term (current) use of opiate analgesic: Secondary | ICD-10-CM | POA: Diagnosis not present

## 2024-06-01 DIAGNOSIS — M48061 Spinal stenosis, lumbar region without neurogenic claudication: Secondary | ICD-10-CM | POA: Diagnosis not present

## 2024-06-01 DIAGNOSIS — M5416 Radiculopathy, lumbar region: Secondary | ICD-10-CM | POA: Diagnosis not present

## 2024-06-01 DIAGNOSIS — G8929 Other chronic pain: Secondary | ICD-10-CM | POA: Diagnosis not present

## 2024-06-01 DIAGNOSIS — M542 Cervicalgia: Secondary | ICD-10-CM | POA: Diagnosis not present

## 2024-06-01 DIAGNOSIS — M961 Postlaminectomy syndrome, not elsewhere classified: Secondary | ICD-10-CM | POA: Diagnosis not present

## 2024-06-08 ENCOUNTER — Other Ambulatory Visit: Payer: Self-pay | Admitting: Orthopedic Surgery

## 2024-06-16 ENCOUNTER — Other Ambulatory Visit (HOSPITAL_COMMUNITY): Payer: Self-pay | Admitting: Orthopedic Surgery

## 2024-06-16 ENCOUNTER — Other Ambulatory Visit: Payer: Self-pay | Admitting: Internal Medicine

## 2024-06-16 ENCOUNTER — Ambulatory Visit (HOSPITAL_COMMUNITY): Payer: Self-pay | Attending: Family Medicine | Admitting: Physical Therapy

## 2024-06-16 DIAGNOSIS — Z7409 Other reduced mobility: Secondary | ICD-10-CM | POA: Diagnosis present

## 2024-06-16 DIAGNOSIS — M542 Cervicalgia: Secondary | ICD-10-CM | POA: Insufficient documentation

## 2024-06-16 DIAGNOSIS — M5459 Other low back pain: Secondary | ICD-10-CM | POA: Insufficient documentation

## 2024-06-16 DIAGNOSIS — M6281 Muscle weakness (generalized): Secondary | ICD-10-CM | POA: Insufficient documentation

## 2024-06-16 NOTE — Therapy (Signed)
 " OUTPATIENT PHYSICAL THERAPY THORACOLUMBAR TREATMENT   Patient Name: Jacqueline [name] MRN: [id number] DOB:November 22, 1960, 64 y.o., female Today's Date: 06/16/2024  END OF SESSION:  PT End of Session - 06/16/24 1628     Visit Number 2    Number of Visits 12    Date for Recertification  08/06/24    Authorization Type Medicaid [employer]    Authorization Time Period 8 visits 05/27/2024-07/09/2024    Authorization - Visit Number 1    Authorization - Number of Visits 8    Progress Note Due on Visit 8    PT Start Time 1448    PT Stop Time 1530    PT Time Calculation (min) 42 min    Activity Tolerance Patient limited by pain    Behavior During Therapy [facility] for tasks assessed/performed           Past Medical History:  Diagnosis Date   Anemia    Arthritis    per patient, back   Complication of anesthesia 2018   per patient HR dropped during partial hysterectomy surgery   Fibroid uterus    GERD (gastroesophageal reflux disease)    H/O total hysterectomy    Heart murmur    Hyperlipidemia    Borderline   Hypertension    Numbness and tingling in right hand    Pre-diabetes    borderline   Sleep apnea    Past Surgical History:  Procedure Laterality Date   BACK SURGERY     BALLOON DILATION N/A 05/22/2021   Procedure: BALLOON DILATION;  Surgeon: Cindie Carlin POUR, DO;  [facility];  Service: Endoscopy;  Laterality: N/A;   BIOPSY  11/18/2022   Procedure: BIOPSY;  Surgeon: Shaaron Lamar HERO, MD;  [facility];  Service: Endoscopy;;   CATARACT EXTRACTION W/PHACO Left 06/27/2023   Procedure: CATARACT EXTRACTION PHACO AND INTRAOCULAR LENS PLACEMENT (IOC);  Surgeon: Harrie Agent, MD;  Location: [facility];  Service: Ophthalmology;  Laterality: Left;  CDE: 3.56   CATARACT EXTRACTION W/PHACO Right 07/11/2023   Procedure: CATARACT EXTRACTION PHACO AND INTRAOCULAR LENS PLACEMENT (IOC);  Surgeon: Harrie Agent, MD;  Location: [facility];  Service: Ophthalmology;  Laterality: Right;   CDE: 2.82   CHOLECYSTECTOMY     COLONOSCOPY  2003   Dr. Shaaron: Anal canal hemorrhoids   COLONOSCOPY N/A 08/25/2014   Normal colonoscopy.  Next colonoscopy in 2026.  Procedure: COLONOSCOPY;  Surgeon: Lamar HERO Shaaron, MD;  [facility];  Service: Endoscopy;  Laterality: N/A;  1200   ESOPHAGOGASTRODUODENOSCOPY N/A 08/25/2014   Procedure: ESOPHAGOGASTRODUODENOSCOPY (EGD);  Surgeon: Lamar HERO Shaaron, MD;  [facility];  Service: Endoscopy;  Laterality: N/A;   ESOPHAGOGASTRODUODENOSCOPY (EGD) WITH PROPOFOL  N/A 05/22/2021   Web and distal esophagus, dilated, gastriti, biopsy positive for mildly active H. pylori gastritis.  s procedure: ESOPHAGOGASTRODUODENOSCOPY (EGD) WITH PROPOFOL ;  Surgeon: Cindie Carlin POUR, DO;  [facility];  Service: Endoscopy;  Laterality: N/A;  9:15am   ESOPHAGOGASTRODUODENOSCOPY (EGD) WITH PROPOFOL  N/A 11/18/2022   Procedure: ESOPHAGOGASTRODUODENOSCOPY (EGD) WITH PROPOFOL ;  Surgeon: Shaaron Lamar HERO, MD;  [facility];  Service: Endoscopy;  Laterality: N/A;  2:15 pm, asa 2   KNEE ARTHROSCOPY WITH MEDIAL MENISECTOMY Left 12/25/2022   Procedure: KNEE ARTHROSCOPY WITH PARTIAL LATERAL MENISCECTOMY DEBRIDEMENT ACL;  Surgeon: Margrette Taft BRAVO, MD;  Location: [facility];  Service: Orthopedics;  Laterality: Left;   MALONEY DILATION N/A 11/18/2022   Procedure: AGAPITO DILATION;  Surgeon: Shaaron Lamar HERO, MD;  [facility];  Service: Endoscopy;  Laterality: N/A;   SALPINGOOPHORECTOMY Bilateral 11/06/2016   Procedure: BILATERAL SALPINGO OOPHORECTOMY;  Surgeon: Jayne Vonn DEL, MD;  Location: [facility];  Service: Gynecology;  Laterality: Bilateral;   SUPRACERVICAL ABDOMINAL HYSTERECTOMY N/A 11/06/2016   Procedure: HYSTERECTOMY SUPRACERVICAL ABDOMINAL;  Surgeon: Jayne Vonn DEL, MD;  Location: [facility];  Service: Gynecology;  Laterality: N/A;   TRANSFORAMINAL LUMBAR INTERBODY FUSION (TLIF) WITH PEDICLE SCREW FIXATION 2 LEVEL Left 07/28/2019    Procedure: LEFT-SIDED LUMBAR 4- 5, LUMBAR 5 -SACRUM1 TRANSFORAMINAL LUMBAR INTERBODY FUSION WITH INSTRUMENTATION AND ALLOGRAFT;  Surgeon: Beuford Anes, MD;  Location: [facility];  Service: Orthopedics;  Laterality: Left;   TUBAL LIGATION     Patient Active Problem List   Diagnosis Date Noted   Lumbosacral radiculopathy 01/21/2024   Vaginal irritation 11/29/2023   Situational anxiety 11/29/2023   Elevated LFTs 08/26/2023   Viral illness 08/06/2023   Epigastric pain 07/07/2023   S/P arthroscopy of left knee debridement of ACL/ lateral meniscectomy 12/25/22 01/02/2023   Bucket-handle tear of lateral meniscus of left knee as current injury 12/25/2022   Left anterior cruciate ligament tear 12/25/2022   Acute non-recurrent maxillary sinusitis 10/26/2022   Mucopurulent conjunctivitis of both eyes 10/26/2022   Acute bronchitis 10/25/2022   Maxillary sinusitis, acute 10/25/2022   Conjunctivitis 10/25/2022   Neck pain, chronic 08/28/2022   Sleep apnea 08/28/2022   Need for immunization against influenza 02/28/2022   Screening for cervical cancer 12/21/2021   H. pylori infection 12/05/2021   Snoring 11/23/2021   Insomnia 11/23/2021   Annual physical exam 10/16/2021   Dermatitis 10/16/2021   Need for varicella vaccine 10/16/2021   Diarrhea 07/05/2021   Stomach cramps 07/05/2021   Nausea & vomiting 07/05/2021   Encounter for vaccination 07/05/2021   Right foot pain 05/16/2021   Dysphagia 05/01/2021   Right shoulder pain 12/27/2020   Rash and nonspecific skin eruption 12/13/2020   Chronic back pain 04/04/2020   History of back surgery 04/04/2020   Radiculopathy 07/28/2019   Borderline diabetes 12/25/2018   Hypertension 06/30/2018   S/P abdominal supracervical subtotal hysterectomy 11/06/2016   Trigger middle finger of right hand 04/24/2016   GERD (gastroesophageal reflux disease) 08/04/2014   Shoulder strain 07/26/2014   Degenerative arthritis of thumb 07/26/2014   Left knee pain  02/28/2014   Hip pain 04/15/2013   Epicondylitis, lateral (tennis elbow) 04/13/2013   Constipation 09/15/2012   Hyperlipidemia 11/28/2011   Obesity, Class I, BMI 30.0-34.9 (see actual BMI) 09/15/2011   ALLERGIC RHINITIS, SEASONAL 11/13/2007    PCP: Edman Meade PEDLAR, FNP   REFERRING PROVIDER: Beuford Anes, MD   REFERRING DIAG: neck and low back pain   Rationale for Evaluation and Treatment: Rehabilitation  THERAPY DIAG:  Other low back pain  Neck pain  Impaired functional mobility, balance, gait, and endurance  Muscle weakness (generalized)  ONSET DATE: 2017  SUBJECTIVE:  SUBJECTIVE STATEMENT: Pt reprots her pain is 7/10 today in her lower back.  States she also has pain in bil shoulders, UE's and neck at times.  Pt reports CT scan of her spine did not show what MD was looking for so awaiting insurance approval for MRI.  Reports she did have the EMG but unable to recall the results.    Evaluation:  Patient reports that her back has been hurting since she fell backward in 2017 and then she had surgery on her back in 2021. She is having pain in her back radiating down both legs into both thighs. She feels that her pain has been getting worse recently. She had a CT on her spine yesterday. She has tried injections and epidurals, but these have not helped. She has a back brace, but she does not wear this much due to it being big and bulky. She also has neck and arm pain.   PERTINENT HISTORY:  OA, prior back surgery, and HTN  PAIN:  Are you having pain? Yes: NPRS scale: Current: 7/10 Best: 7/10 Worst: 9/10 Pain location: low back and both thighs  Pain description: aching and throbbing Aggravating factors: bending, twisting, and lifting Relieving factors: none known   PRECAUTIONS: None  RED  FLAGS: None   WEIGHT BEARING RESTRICTIONS: No  FALLS:  Has patient fallen in last 6 months? No, but has multiple close calls.   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  DIAGNOSTIC FINDINGS: 05/27/24 lumbar CT scan  Exam results not yet released  PATIENT SURVEYS:  Modified Oswestry:  MODIFIED OSWESTRY DISABILITY SCALE  Date: 05/27/24 Score  Pain intensity 4 =  Pain medication provides me with little relief from pain.  2. Personal care (washing, dressing, etc.) 2 =  It is painful to take care of myself, and I am slow and careful.  3. Lifting 4 = I can lift only very light weights  4. Walking 3 =  Pain prevents me from walking more than  mile.  5. Sitting 3 =  Pain prevents me from sitting more than  hour.  6. Standing 4 =  Pain prevents me from standing more than 10 minutes.  7. Sleeping 3 =  Even when I take pain medication, I sleep less than 4 hours.  8. Social Life 3 =  Pain prevents me from going out very often.  9. Traveling 4 = My pain restricts my travel to short necessary journeys under 1/2 hour.  10. Employment/ Homemaking 3 = Pain prevents me from doing anything but light duties.  Total 33/50   Interpretation of scores: Score Category Description  0-20% Minimal Disability The patient can cope with most living activities. Usually no treatment is indicated apart from advice on lifting, sitting and exercise  21-40% Moderate Disability The patient experiences more pain and difficulty with sitting, lifting and standing.  Travel and social life are more difficult and they may be disabled from work. Personal care, sexual activity and sleeping are not grossly affected, and the patient can usually be managed  by conservative means  41-60% Severe Disability Pain remains the main problem in this group, but activities of daily living are affected. These patients require a detailed investigation  61-80% Crippled Back pain impinges on all aspects of the patients life. Positive intervention is required  81-100% Bed-bound These patients are either bed-bound or exaggerating their symptoms  Bluford FORBES Zoe DELENA Karon DELENA, et al. Surgery versus conservative management of stable thoracolumbar fracture: the PRESTO feasibility RCT. [place] ([place]): [employer]; 2021 Nov. [facility] Technology Assessment, No. 25.62.) Appendix 3, Oswestry Disability Index category descriptors. Available from: Findjewelers.cz  Minimally Clinically Important Difference (MCID) = 12.8%  COGNITION: Overall cognitive status: Within functional limits for tasks assessed     SENSATION: Patient reports intermittent lower extremity tingling, but none currently  POSTURE: forward head and decreased lumbar lordosis  PALPATION: Unable to assess due to significant subjective history and time constraints  LUMBAR ROM:   AROM eval  Flexion 75% limited; familiar pain   Extension 100% limited; familiar pain  Right lateral flexion   Left lateral flexion   Right rotation 90% limited; familiar pain   Left rotation 90% limited; familiar pain   (Blank rows = not tested)  LOWER EXTREMITY ROM:  WFL for activities assessed  LOWER EXTREMITY MMT:    MMT Right eval Left eval  Hip flexion 4/5; slight pain  4/5  Hip extension    Hip abduction    Hip adduction    Hip internal rotation    Hip external rotation    Knee flexion 4+/5 4+/5  Knee extension 4+/5; familiar pain  4+/5; dull pain   Ankle dorsiflexion 3+/5 3+/5   Ankle plantarflexion    Ankle inversion    Ankle eversion     (Blank rows = not tested)  LUMBAR SPECIAL TESTS:  Unable to be assessed at this time due to significant subjective history and time constraints  FUNCTIONAL TESTS:  5 times sit to stand: 06/16/24:  41.25 no UE  Timed up and go (TUG): 06/16/24: 19.98, antalgic with difficulty standing  2 minute walk test: 06/16/24: 262 feet no AD  GAIT: Assistive device utilized: None Level of assistance: Complete Independence Comments: trendelenburg, decreased gait speed and stride length   TREATMENT DATE:                                                                                                                               06/16/2024 Functional test measures (see above) Standing:  hip excursions 5X each, minimal movement and guarding Sit to stands no UE 5X Supine:  abdominal iso 10X5  Knee to chest with towel 5X10  LTR 10X5  Piriformis stretch 3X20 each  Bridge 10X  05/27/24: PT evaluation and education    PATIENT EDUCATION:  Education details: POC, prognosis, healing, objective findings, and plan for the next visit Person educated: Patient Education method: Explanation Education comprehension: verbalized understanding  HOME EXERCISE PROGRAM: Evaluation:  Unable to be  provided at initial evaluation  Access Code: [email or web address] URL: [email or web address] Date: 06/16/2024 Prepared by: Greig Fuse Exercises - Supine Piriformis Stretch with Foot on Ground  - 2 x daily - 7 x weekly - 3 reps - 20 sec hold - Hooklying Single Knee to Chest Stretch  - 2 x daily - 7 x weekly - 5 reps - 10 sec hold - Supine Lower Trunk Rotation  - 2 x daily - 7 x weekly - 10 reps - 5 sec hold - Supine Transversus Abdominis Bracing with Pelvic Floor Contraction  - 2 x daily - 7 x weekly - 10 reps - 5 sec hold - Supine Bridge  - 2 x daily - 7 x weekly - 10 reps - Sit to Stand  - 2 x daily - 7 x weekly - 10 reps   ASSESSMENT:  CLINICAL  IMPRESSION: Began session with functional testing today.  Pt took increased time with all due to increased pain and reduced mobility.  Began with hip excursions, however tends to move body as unit remaining guarded.  Extension is more painful and restricted than flexion.  Logroll technique completed to assume supine and educated on core stabilization, isometric abdominal contraction.  Limited ROM with lower trunk rotations with hesitancy to increase ROM.  Bridges completed in reduced ROM but without pain.   All movements resulted with verbalized discomfort and irritability with both UE and LE musculature. Explained she may have a little soreness/discomfort initially but should improve as she does the exercises.  Pt issued HEP to include basic mobility, core stab and LE functional strengthening.  Pt will continue to benefit from skilled physical therapy to address her impairments to maximize her safety and functional mobility.   OBJECTIVE IMPAIRMENTS: Abnormal gait, decreased activity tolerance, decreased balance, decreased mobility, difficulty walking, decreased ROM, decreased strength, hypomobility, impaired tone, postural dysfunction, and pain.   ACTIVITY LIMITATIONS: carrying, lifting, bending, sitting, standing, squatting, sleeping, stairs, transfers, bathing, toileting, dressing, hygiene/grooming, and locomotion level  PARTICIPATION LIMITATIONS: cleaning, driving, shopping, community activity, occupation, and yard work  PERSONAL FACTORS: Past/current experiences, Time since onset of injury/illness/exacerbation, and 3+ comorbidities: OA, prior back surgery, and HTN are also affecting patient's functional outcome.   REHAB POTENTIAL: Fair    CLINICAL DECISION MAKING: Evolving/moderate complexity  EVALUATION COMPLEXITY: Moderate   GOALS: Goals reviewed with patient? No  SHORT TERM GOALS: Target date: 06/17/24  Patient will be independent with her initial HEP.  Baseline: Goal status:  INITIAL  2.  Patient will report at least a 30% improvement with physical therapy.  Baseline:  Goal status: INITIAL  3.  Patient will be able to demonstrate proper lifting mechanics for improved function with her yard work.  Baseline:  Goal status: INITIAL  LONG TERM GOALS: Target date: 07/08/24  Patient will be independent with her advanced HEP.  Baseline:  Goal status: INITIAL  2.  Patient will report at least a 60% improvement with physical therapy.  Baseline:  Goal status: INITIAL  3.  Patient will be able to complete her daily activities without her familiar symptoms exceeding 6/10. Baseline:  Goal status: INITIAL  4.  Patient will improve her ODI score to 25/50 or less for improved perceived function with her daily activities.  Baseline:  Goal status: INITIAL  PLAN:  PT FREQUENCY: 2x/week  PT DURATION: 6 weeks  PLANNED INTERVENTIONS: 97164- PT Re-evaluation, 97750- Physical Performance Testing, 97110-Therapeutic exercises, 97530- Therapeutic activity, [id number]- Neuromuscular re-education, 97535- Self Care, [id number]- Manual therapy, [id number]- Gait training, [phone]-  Electrical stimulation (unattended), [id number]- Traction (mechanical), 79439 (1-2 muscles), 20561 (3+ muscles)- Dry Needling, Patient/Family education, Balance training, Stair training, Taping, Joint mobilization, Spinal mobilization, Cryotherapy, and Moist heat.  PLAN FOR NEXT SESSION: continue to progress functional mobility and strengthening.  Update HEP as needed.  Begin nustep next session to attempt increased mobility.    Greig KATHEE Fuse, PTA/CLT [facility] Ph: [phone]  Fuse Greig KATHEE, PTA 06/16/2024, 4:30 PM   "

## 2024-06-18 ENCOUNTER — Ambulatory Visit (HOSPITAL_COMMUNITY)

## 2024-06-18 ENCOUNTER — Ambulatory Visit: Payer: Self-pay | Admitting: Professional Counselor

## 2024-06-18 ENCOUNTER — Encounter (HOSPITAL_COMMUNITY): Payer: Self-pay

## 2024-06-18 DIAGNOSIS — M5459 Other low back pain: Secondary | ICD-10-CM | POA: Diagnosis not present

## 2024-06-18 DIAGNOSIS — M542 Cervicalgia: Secondary | ICD-10-CM

## 2024-06-18 DIAGNOSIS — Z7409 Other reduced mobility: Secondary | ICD-10-CM

## 2024-06-18 DIAGNOSIS — F411 Generalized anxiety disorder: Secondary | ICD-10-CM
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
## 2024-06-18 NOTE — Therapy (Signed)
 " OUTPATIENT PHYSICAL THERAPY THORACOLUMBAR TREATMENT   Patient Name: Jacqueline [name] MRN: [id number] DOB:[birth year], 64 y.o., female Today's Date: 06/18/2024  END OF SESSION:  PT End of Session - 06/18/24 1549     Visit Number 3    Number of Visits 12    Date for Recertification  08/06/24    Authorization Type Medicaid [employer]    Authorization Time Period 8 visits 05/27/2024-07/09/2024    Authorization - Visit Number 2    Authorization - Number of Visits 8    Progress Note Due on Visit 8    PT Start Time 1549    PT Stop Time 1631    PT Time Calculation (min) 42 min    Activity Tolerance Patient limited by pain    Behavior During Therapy [facility] for tasks assessed/performed            Past Medical History:  Diagnosis Date   Anemia    Arthritis    per patient, back   Complication of anesthesia 2018   per patient HR dropped during partial hysterectomy surgery   Fibroid uterus    GERD (gastroesophageal reflux disease)    H/O total hysterectomy    Heart murmur    Hyperlipidemia    Borderline   Hypertension    Numbness and tingling in right hand    Pre-diabetes    borderline   Sleep apnea    Past Surgical History:  Procedure Laterality Date   BACK SURGERY     BALLOON DILATION N/A 05/22/2021   Procedure: BALLOON DILATION;  Surgeon: Cindie Carlin POUR, DO;  [facility];  Service: Endoscopy;  Laterality: N/A;   BIOPSY  11/18/2022   Procedure: BIOPSY;  Surgeon: Shaaron Lamar HERO, MD;  [facility];  Service: Endoscopy;;   CATARACT EXTRACTION W/PHACO Left 06/27/2023   Procedure: CATARACT EXTRACTION PHACO AND INTRAOCULAR LENS PLACEMENT (IOC);  Surgeon: Harrie Agent, MD;  Location: [facility];  Service: Ophthalmology;  Laterality: Left;  CDE: 3.56   CATARACT EXTRACTION W/PHACO Right 07/11/2023   Procedure: CATARACT EXTRACTION PHACO AND INTRAOCULAR LENS PLACEMENT (IOC);  Surgeon: Harrie Agent, MD;  Location: [facility];  Service: Ophthalmology;  Laterality: Right;   CDE: 2.82   CHOLECYSTECTOMY     COLONOSCOPY  2003   Dr. Shaaron: Anal canal hemorrhoids   COLONOSCOPY N/A 08/25/2014   Normal colonoscopy.  Next colonoscopy in 2026.  Procedure: COLONOSCOPY;  Surgeon: Lamar HERO Shaaron, MD;  [facility];  Service: Endoscopy;  Laterality: N/A;  1200   ESOPHAGOGASTRODUODENOSCOPY N/A 08/25/2014   Procedure: ESOPHAGOGASTRODUODENOSCOPY (EGD);  Surgeon: Lamar HERO Shaaron, MD;  [facility];  Service: Endoscopy;  Laterality: N/A;   ESOPHAGOGASTRODUODENOSCOPY (EGD) WITH PROPOFOL  N/A 05/22/2021   Web and distal esophagus, dilated, gastriti, biopsy positive for mildly active H. pylori gastritis.  s procedure: ESOPHAGOGASTRODUODENOSCOPY (EGD) WITH PROPOFOL ;  Surgeon: Cindie Carlin POUR, DO;  [facility];  Service: Endoscopy;  Laterality: N/A;  9:15am   ESOPHAGOGASTRODUODENOSCOPY (EGD) WITH PROPOFOL  N/A 11/18/2022   Procedure: ESOPHAGOGASTRODUODENOSCOPY (EGD) WITH PROPOFOL ;  Surgeon: Shaaron Lamar HERO, MD;  [facility];  Service: Endoscopy;  Laterality: N/A;  2:15 pm, asa 2   KNEE ARTHROSCOPY WITH MEDIAL MENISECTOMY Left 12/25/2022   Procedure: KNEE ARTHROSCOPY WITH PARTIAL LATERAL MENISCECTOMY DEBRIDEMENT ACL;  Surgeon: Margrette Taft BRAVO, MD;  Location: [facility];  Service: Orthopedics;  Laterality: Left;   MALONEY DILATION N/A 11/18/2022   Procedure: AGAPITO DILATION;  Surgeon: Shaaron Lamar HERO, MD;  [facility];  Service: Endoscopy;  Laterality: N/A;   SALPINGOOPHORECTOMY Bilateral 11/06/2016   Procedure: BILATERAL SALPINGO OOPHORECTOMY;  Surgeon: Jayne Vonn DEL, MD;  Location: [facility];  Service: Gynecology;  Laterality: Bilateral;   SUPRACERVICAL ABDOMINAL HYSTERECTOMY N/A 11/06/2016   Procedure: HYSTERECTOMY SUPRACERVICAL ABDOMINAL;  Surgeon: Jayne Vonn DEL, MD;  Location: [facility];  Service: Gynecology;  Laterality: N/A;   TRANSFORAMINAL LUMBAR INTERBODY FUSION (TLIF) WITH PEDICLE SCREW FIXATION 2 LEVEL Left 07/28/2019    Procedure: LEFT-SIDED LUMBAR 4- 5, LUMBAR 5 -SACRUM1 TRANSFORAMINAL LUMBAR INTERBODY FUSION WITH INSTRUMENTATION AND ALLOGRAFT;  Surgeon: Beuford Anes, MD;  Location: [facility];  Service: Orthopedics;  Laterality: Left;   TUBAL LIGATION     Patient Active Problem List   Diagnosis Date Noted   Lumbosacral radiculopathy 01/21/2024   Vaginal irritation 11/29/2023   Situational anxiety 11/29/2023   Elevated LFTs 08/26/2023   Viral illness 08/06/2023   Epigastric pain 07/07/2023   S/P arthroscopy of left knee debridement of ACL/ lateral meniscectomy 12/25/22 01/02/2023   Bucket-handle tear of lateral meniscus of left knee as current injury 12/25/2022   Left anterior cruciate ligament tear 12/25/2022   Acute non-recurrent maxillary sinusitis 10/26/2022   Mucopurulent conjunctivitis of both eyes 10/26/2022   Acute bronchitis 10/25/2022   Maxillary sinusitis, acute 10/25/2022   Conjunctivitis 10/25/2022   Neck pain, chronic 08/28/2022   Sleep apnea 08/28/2022   Need for immunization against influenza 02/28/2022   Screening for cervical cancer 12/21/2021   H. pylori infection 12/05/2021   Snoring 11/23/2021   Insomnia 11/23/2021   Annual physical exam 10/16/2021   Dermatitis 10/16/2021   Need for varicella vaccine 10/16/2021   Diarrhea 07/05/2021   Stomach cramps 07/05/2021   Nausea & vomiting 07/05/2021   Encounter for vaccination 07/05/2021   Right foot pain 05/16/2021   Dysphagia 05/01/2021   Right shoulder pain 12/27/2020   Rash and nonspecific skin eruption 12/13/2020   Chronic back pain 04/04/2020   History of back surgery 04/04/2020   Radiculopathy 07/28/2019   Borderline diabetes 12/25/2018   Hypertension 06/30/2018   S/P abdominal supracervical subtotal hysterectomy 11/06/2016   Trigger middle finger of right hand 04/24/2016   GERD (gastroesophageal reflux disease) 08/04/2014   Shoulder strain 07/26/2014   Degenerative arthritis of thumb 07/26/2014   Left knee pain  02/28/2014   Hip pain 04/15/2013   Epicondylitis, lateral (tennis elbow) 04/13/2013   Constipation 09/15/2012   Hyperlipidemia 11/28/2011   Obesity, Class I, BMI 30.0-34.9 (see actual BMI) 09/15/2011   ALLERGIC RHINITIS, SEASONAL 11/13/2007    PCP: Edman Meade PEDLAR, FNP   REFERRING PROVIDER: Beuford Anes, MD   REFERRING DIAG: neck and low back pain   Rationale for Evaluation and Treatment: Rehabilitation  THERAPY DIAG:  Other low back pain  Neck pain  Impaired functional mobility, balance, gait, and endurance  ONSET DATE: 2017  SUBJECTIVE:  SUBJECTIVE STATEMENT: Patient reports that she still hurts in her back. Her shoulders are also really hurting today.   Evaluation:  Patient reports that her back has been hurting since she fell backward in 2017 and then she had surgery on her back in 2021. She is having pain in her back radiating down both legs into both thighs. She feels that her pain has been getting worse recently. She had a CT on her spine yesterday. She has tried injections and epidurals, but these have not helped. She has a back brace, but she does not wear this much due to it being big and bulky. She also has neck and arm pain.   PERTINENT HISTORY:  OA, prior back surgery, and HTN  PAIN:  Are you having pain? Yes: NPRS scale: Current: 7/10 Best: 7/10 Worst: 9/10 Pain location: low back and both thighs  Pain description: aching and throbbing Aggravating factors: bending, twisting, and lifting Relieving factors: none known   PRECAUTIONS: None  RED FLAGS: None   WEIGHT BEARING RESTRICTIONS: No  FALLS:  Has patient fallen in last 6 months? No, but has multiple close calls.   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  DIAGNOSTIC FINDINGS: 05/27/24 lumbar CT scan  Exam results not yet released  PATIENT SURVEYS:  Modified Oswestry:  MODIFIED OSWESTRY DISABILITY SCALE  Date: 05/27/24 Score  Pain intensity 4 =  Pain medication provides me with little relief from pain.  2. Personal care (washing, dressing, etc.) 2 =  It is painful to take care of myself, and I am slow and careful.  3. Lifting 4 = I can lift only very light weights  4. Walking 3 =  Pain prevents me from walking more than  mile.  5. Sitting 3 =  Pain prevents me from sitting more than  hour.  6. Standing 4 =  Pain prevents me from standing more than 10 minutes.  7. Sleeping 3 =  Even when I take pain medication, I sleep less than 4 hours.  8. Social Life 3 =  Pain prevents me from going out very often.  9. Traveling 4 = My pain restricts my travel to short necessary journeys under 1/2 hour.  10. Employment/ Homemaking 3 = Pain prevents me from doing anything but light duties.  Total 33/50   Interpretation of scores: Score Category Description  0-20% Minimal Disability The patient can cope with most living activities. Usually no treatment is indicated apart from advice on lifting, sitting and exercise  21-40% Moderate Disability The patient experiences more pain and difficulty with sitting, lifting and standing. Travel and social life are more difficult and they may be disabled from work. Personal care, sexual activity and sleeping are not grossly affected, and the patient can usually be managed by conservative means  41-60% Severe Disability  Pain remains the main problem in this group, but activities of daily living are affected. These patients require a detailed investigation  61-80% Crippled Back pain impinges on all aspects of the patients life. Positive intervention is required  81-100% Bed-bound These patients are either bed-bound or exaggerating their symptoms  Bluford FORBES Zoe DELENA Karon DELENA, et al. Surgery versus conservative management of stable thoracolumbar fracture: the PRESTO feasibility RCT. [place] ([place]): [employer]; 2021 Nov. [facility] Technology Assessment, No. 25.62.) Appendix 3, Oswestry Disability Index category descriptors. Available from: Findjewelers.cz  Minimally Clinically Important Difference (MCID) = 12.8%  COGNITION: Overall cognitive status: Within functional limits for tasks assessed     SENSATION: Patient reports intermittent lower extremity tingling, but none currently  POSTURE: forward head and decreased lumbar lordosis  PALPATION: Unable to assess due to significant subjective history and time constraints  LUMBAR ROM:   AROM eval  Flexion 75% limited; familiar pain   Extension 100% limited; familiar pain  Right lateral flexion   Left lateral flexion   Right rotation 90% limited; familiar pain   Left rotation 90% limited; familiar pain   (Blank rows = not tested)  LOWER EXTREMITY ROM:  WFL for activities assessed  LOWER EXTREMITY MMT:    MMT Right eval Left eval  Hip flexion 4/5; slight pain  4/5  Hip extension    Hip abduction    Hip adduction    Hip internal rotation    Hip external rotation    Knee flexion 4+/5 4+/5  Knee extension 4+/5; familiar pain  4+/5; dull pain   Ankle dorsiflexion 3+/5 3+/5  Ankle plantarflexion    Ankle inversion    Ankle eversion     (Blank rows = not tested)  LUMBAR SPECIAL TESTS:  Unable to be assessed at this time due to significant subjective history and time constraints  FUNCTIONAL TESTS:  5  times sit to stand: 06/16/24:  41.25 no UE  Timed up and go (TUG): 06/16/24: 19.98, antalgic with difficulty standing  2 minute walk test: 06/16/24: 262 feet no AD  GAIT: Assistive device utilized: None Level of assistance: Complete Independence Comments: trendelenburg, decreased gait speed and stride length   TREATMENT DATE:                                                                                                                                                                 06/18/24 EXERCISE LOG  Exercise Repetitions and Resistance Comments  Nustep  L1 x 5 minutes    Supine glute sets   20 reps w/ 5 second hold    Supine piriformis stretch  2 x 30 seconds each    Lower trunk rotation  1.5 minute    Supine ab bracing  15 reps w/ 5 second hold    Seated march  15 reps each  Alternating LE   Seated toe raises  20 reps    Seated hip ABD 15 reps each    Seated heel raise  20  reps each    Seated ADD isometric   2 minutes w/ 5 second hold    LAQ  2 minutes  Alternating LE    Blank cell = exercise not performed today   06/16/2024 Functional test measures (see above) Standing:  hip excursions 5X each, minimal movement and guarding Sit to stands no UE 5X Supine:  abdominal iso 10X5  Knee to chest with towel 5X10  LTR 10X5  Piriformis stretch 3X20 each  Bridge 10X  05/27/24: PT evaluation and education    PATIENT EDUCATION:  Education details: HEP  Person educated: Patient Education method: Programmer, Multimedia, Facilities Manager, and Handouts Education comprehension: verbalized understanding and returned demonstration  HOME EXERCISE PROGRAM: Evaluation:  Unable to be provided at initial evaluation  Access Code: [email or web address] URL: [email or web address] Date: 06/18/2024 Prepared by: Lacinda Fass  Exercises - Hooklying Gluteal Sets  - 1 x daily - 7 x weekly - 2 sets - 10 reps - 5 seconds  hold - Supine Hip Adduction Isometric with Ball  - 1 x daily - 7 x weekly - 2 sets -  10 reps - 5 seconds hold - Seated Hip Adduction Isometrics with Ball  - 1 x daily - 7 x weekly - 2 sets - 10 reps - 5 seconds hold  Access Code: [email or web address] URL: [email or web address] Date: 06/16/2024 Prepared by: Greig Fuse Exercises - Supine Piriformis Stretch with Foot on Ground  - 2 x daily - 7 x weekly - 3 reps - 20 sec hold - Hooklying Single Knee to Chest Stretch  - 2 x daily - 7 x weekly - 5 reps - 10 sec hold - Supine Lower Trunk Rotation  - 2 x daily - 7 x weekly - 10 reps - 5 sec hold - Supine Transversus Abdominis Bracing with Pelvic Floor Contraction  - 2 x daily - 7 x weekly - 10 reps - 5 sec hold - Supine Bridge  - 2 x daily - 7 x weekly - 10 reps - Sit to Stand  - 2 x daily - 7 x weekly - 10 reps   ASSESSMENT:  CLINICAL IMPRESSION: Patient was introduced to multiple new interventions for reduced pain and mobility. She required minimal cueing with today's new interventions for proper biomechanics. Her high pain severity and irritability limited her ability to be progressed with standing interventions. She reported feeling alright upon the conclusion of treatment. Patient continues to require skilled physical therapy to address her remaining impairments to return to her prior level of function.    OBJECTIVE IMPAIRMENTS: Abnormal gait, decreased activity tolerance, decreased balance, decreased mobility, difficulty walking, decreased ROM, decreased strength, hypomobility, impaired tone, postural dysfunction, and pain.   ACTIVITY LIMITATIONS: carrying, lifting, bending, sitting, standing, squatting, sleeping, stairs, transfers, bathing, toileting, dressing, hygiene/grooming, and locomotion level  PARTICIPATION LIMITATIONS: cleaning, driving, shopping, community activity, occupation, and yard work  PERSONAL FACTORS: Past/current experiences, Time since onset of injury/illness/exacerbation, and 3+ comorbidities: OA, prior back surgery, and HTN are also affecting  patient's functional outcome.   REHAB POTENTIAL: Fair    CLINICAL DECISION MAKING: Evolving/moderate complexity  EVALUATION COMPLEXITY: Moderate   GOALS: Goals reviewed with patient? No  SHORT TERM GOALS: Target date: 06/17/24  Patient will be independent with her initial HEP.  Baseline: Goal status: INITIAL  2.  Patient will report at least a 30% improvement with physical therapy.  Baseline:  Goal status: INITIAL  3.  Patient will be able to demonstrate proper lifting mechanics for improved function with her yard work.  Baseline:  Goal status: INITIAL  LONG TERM GOALS: Target date: 07/08/24  Patient will be independent with her advanced HEP.  Baseline:  Goal status: INITIAL  2.  Patient will report at least a 60% improvement with physical therapy.  Baseline:  Goal status: INITIAL  3.  Patient will be able to complete her daily activities without her familiar symptoms exceeding 6/10. Baseline:  Goal status: INITIAL  4.  Patient will improve her ODI score to 25/50 or less for improved perceived function with her daily activities.  Baseline:  Goal status: INITIAL  PLAN:  PT FREQUENCY: 2x/week  PT DURATION: 6 weeks  PLANNED INTERVENTIONS: 97164- PT Re-evaluation, 97750- Physical Performance Testing, 97110-Therapeutic exercises, 97530- Therapeutic activity, [id number]- Neuromuscular re-education, 97535- Self Care, [id number]- Manual therapy, [phone]- Gait training, [phone]- Electrical stimulation (unattended), [phone]- Traction (mechanical), [phone] (1-2 muscles), 20561 (3+ muscles)- Dry Needling, Patient/Family education, Balance training, Stair training, Taping, Joint mobilization, Spinal mobilization, Cryotherapy, and Moist heat.  PLAN FOR NEXT SESSION: continue to progress functional mobility and strengthening.  Update HEP as needed.  Begin nustep next session to attempt increased mobility.     Lacinda JAYSON Fass, PT 06/18/2024, 5:09 PM   "

## 2024-06-21 ENCOUNTER — Ambulatory Visit (HOSPITAL_COMMUNITY)
Admission: RE | Admit: 2024-06-21 | Discharge: 2024-06-21 | Disposition: A | Discharge status: Home / Self Care | Source: Ambulatory Visit | Attending: Orthopedic Surgery | Admitting: Orthopedic Surgery

## 2024-06-21 DIAGNOSIS — M5459 Other low back pain: Secondary | ICD-10-CM | POA: Diagnosis present

## 2024-06-23 ENCOUNTER — Ambulatory Visit (HOSPITAL_COMMUNITY)

## 2024-06-23 DIAGNOSIS — M5459 Other low back pain: Secondary | ICD-10-CM | POA: Diagnosis not present

## 2024-06-23 DIAGNOSIS — Z7409 Other reduced mobility: Secondary | ICD-10-CM

## 2024-06-23 DIAGNOSIS — M542 Cervicalgia: Secondary | ICD-10-CM

## 2024-06-23 NOTE — Therapy (Signed)
 " OUTPATIENT PHYSICAL THERAPY THORACOLUMBAR TREATMENT   Patient Name: Jacqueline [name] MRN: [id number] DOB:1960-07-30, 64 y.o., female Today's Date: 06/23/2024  END OF SESSION:  PT End of Session - 06/23/24 1550     Visit Number 4    Number of Visits 12    Date for Recertification  08/06/24    Authorization Type Medicaid [employer]    Authorization Time Period 8 visits 05/27/2024-07/09/2024    Authorization - Visit Number 3    Authorization - Number of Visits 8    Progress Note Due on Visit 8    PT Start Time 1550    PT Stop Time 1631    PT Time Calculation (min) 41 min    Activity Tolerance Patient limited by pain    Behavior During Therapy [facility] for tasks assessed/performed            Past Medical History:  Diagnosis Date   Anemia    Arthritis    per patient, back   Complication of anesthesia 2018   per patient HR dropped during partial hysterectomy surgery   Fibroid uterus    GERD (gastroesophageal reflux disease)    H/O total hysterectomy    Heart murmur    Hyperlipidemia    Borderline   Hypertension    Numbness and tingling in right hand    Pre-diabetes    borderline   Sleep apnea    Past Surgical History:  Procedure Laterality Date   BACK SURGERY     BALLOON DILATION N/A 05/22/2021   Procedure: BALLOON DILATION;  Surgeon: Cindie Carlin POUR, DO;  [facility];  Service: Endoscopy;  Laterality: N/A;   BIOPSY  11/18/2022   Procedure: BIOPSY;  Surgeon: Shaaron Lamar HERO, MD;  [facility];  Service: Endoscopy;;   CATARACT EXTRACTION W/PHACO Left 06/27/2023   Procedure: CATARACT EXTRACTION PHACO AND INTRAOCULAR LENS PLACEMENT (IOC);  Surgeon: Harrie Agent, MD;  Location: [facility];  Service: Ophthalmology;  Laterality: Left;  CDE: 3.56   CATARACT EXTRACTION W/PHACO Right 07/11/2023   Procedure: CATARACT EXTRACTION PHACO AND INTRAOCULAR LENS PLACEMENT (IOC);  Surgeon: Harrie Agent, MD;  Location: [facility];  Service: Ophthalmology;  Laterality: Right;   CDE: 2.82   CHOLECYSTECTOMY     COLONOSCOPY  2003   Dr. Shaaron: Anal canal hemorrhoids   COLONOSCOPY N/A 08/25/2014   Normal colonoscopy.  Next colonoscopy in 2026.  Procedure: COLONOSCOPY;  Surgeon: Lamar HERO Shaaron, MD;  [facility];  Service: Endoscopy;  Laterality: N/A;  1200   ESOPHAGOGASTRODUODENOSCOPY N/A 08/25/2014   Procedure: ESOPHAGOGASTRODUODENOSCOPY (EGD);  Surgeon: Lamar HERO Shaaron, MD;  [facility];  Service: Endoscopy;  Laterality: N/A;   ESOPHAGOGASTRODUODENOSCOPY (EGD) WITH PROPOFOL  N/A 05/22/2021   Web and distal esophagus, dilated, gastriti, biopsy positive for mildly active H. pylori gastritis.  s procedure: ESOPHAGOGASTRODUODENOSCOPY (EGD) WITH PROPOFOL ;  Surgeon: Cindie Carlin POUR, DO;  [facility];  Service: Endoscopy;  Laterality: N/A;  9:15am   ESOPHAGOGASTRODUODENOSCOPY (EGD) WITH PROPOFOL  N/A 11/18/2022   Procedure: ESOPHAGOGASTRODUODENOSCOPY (EGD) WITH PROPOFOL ;  Surgeon: Shaaron Lamar HERO, MD;  [facility];  Service: Endoscopy;  Laterality: N/A;  2:15 pm, asa 2   KNEE ARTHROSCOPY WITH MEDIAL MENISECTOMY Left 12/25/2022   Procedure: KNEE ARTHROSCOPY WITH PARTIAL LATERAL MENISCECTOMY DEBRIDEMENT ACL;  Surgeon: Margrette Taft BRAVO, MD;  Location: [facility];  Service: Orthopedics;  Laterality: Left;   MALONEY DILATION N/A 11/18/2022   Procedure: AGAPITO DILATION;  Surgeon: Shaaron Lamar HERO, MD;  [facility];  Service: Endoscopy;  Laterality: N/A;   SALPINGOOPHORECTOMY Bilateral 11/06/2016   Procedure: BILATERAL SALPINGO OOPHORECTOMY;  Surgeon: Jayne Vonn DEL, MD;  Location: [facility];  Service: Gynecology;  Laterality: Bilateral;   SUPRACERVICAL ABDOMINAL HYSTERECTOMY N/A 11/06/2016   Procedure: HYSTERECTOMY SUPRACERVICAL ABDOMINAL;  Surgeon: Jayne Vonn DEL, MD;  Location: [facility];  Service: Gynecology;  Laterality: N/A;   TRANSFORAMINAL LUMBAR INTERBODY FUSION (TLIF) WITH PEDICLE SCREW FIXATION 2 LEVEL Left 07/28/2019    Procedure: LEFT-SIDED LUMBAR 4- 5, LUMBAR 5 -SACRUM1 TRANSFORAMINAL LUMBAR INTERBODY FUSION WITH INSTRUMENTATION AND ALLOGRAFT;  Surgeon: Beuford Anes, MD;  Location: [facility];  Service: Orthopedics;  Laterality: Left;   TUBAL LIGATION     Patient Active Problem List   Diagnosis Date Noted   Lumbosacral radiculopathy 01/21/2024   Vaginal irritation 11/29/2023   Situational anxiety 11/29/2023   Elevated LFTs 08/26/2023   Viral illness 08/06/2023   Epigastric pain 07/07/2023   S/P arthroscopy of left knee debridement of ACL/ lateral meniscectomy 12/25/22 01/02/2023   Bucket-handle tear of lateral meniscus of left knee as current injury 12/25/2022   Left anterior cruciate ligament tear 12/25/2022   Acute non-recurrent maxillary sinusitis 10/26/2022   Mucopurulent conjunctivitis of both eyes 10/26/2022   Acute bronchitis 10/25/2022   Maxillary sinusitis, acute 10/25/2022   Conjunctivitis 10/25/2022   Neck pain, chronic 08/28/2022   Sleep apnea 08/28/2022   Need for immunization against influenza 02/28/2022   Screening for cervical cancer 12/21/2021   H. pylori infection 12/05/2021   Snoring 11/23/2021   Insomnia 11/23/2021   Annual physical exam 10/16/2021   Dermatitis 10/16/2021   Need for varicella vaccine 10/16/2021   Diarrhea 07/05/2021   Stomach cramps 07/05/2021   Nausea & vomiting 07/05/2021   Encounter for vaccination 07/05/2021   Right foot pain 05/16/2021   Dysphagia 05/01/2021   Right shoulder pain 12/27/2020   Rash and nonspecific skin eruption 12/13/2020   Chronic back pain 04/04/2020   History of back surgery 04/04/2020   Radiculopathy 07/28/2019   Borderline diabetes 12/25/2018   Hypertension 06/30/2018   S/P abdominal supracervical subtotal hysterectomy 11/06/2016   Trigger middle finger of right hand 04/24/2016   GERD (gastroesophageal reflux disease) 08/04/2014   Shoulder strain 07/26/2014   Degenerative arthritis of thumb 07/26/2014   Left knee pain  02/28/2014   Hip pain 04/15/2013   Epicondylitis, lateral (tennis elbow) 04/13/2013   Constipation 09/15/2012   Hyperlipidemia 11/28/2011   Obesity, Class I, BMI 30.0-34.9 (see actual BMI) 09/15/2011   ALLERGIC RHINITIS, SEASONAL 11/13/2007    PCP: Edman Meade PEDLAR, FNP   REFERRING PROVIDER: Beuford Anes, MD   REFERRING DIAG: neck and low back pain   Rationale for Evaluation and Treatment: Rehabilitation  THERAPY DIAG:  Other low back pain  Neck pain  Impaired functional mobility, balance, gait, and endurance  ONSET DATE: 2017  SUBJECTIVE:  SUBJECTIVE STATEMENT: Pt limited by Lt shoulder and lower back pain with radicular symptoms down Lt LE to foot.  Has plans to review MRI findings this Friday.  She put a pain patch on Saturday, reports of symptoms Sunday including lightheaded, dizziness and vomiting, removed immediately.  Evaluation:  Patient reports that her back has been hurting since she fell backward in 2017 and then she had surgery on her back in 2021. She is having pain in her back radiating down both legs into both thighs. She feels that her pain has been getting worse recently. She had a CT on her spine yesterday. She has tried injections and epidurals, but these have not helped. She has a back brace, but she does not wear this much due to it being big and bulky. She also has neck and arm pain.   PERTINENT HISTORY:  OA, prior back surgery, and HTN  PAIN:  Are you having pain? Yes: NPRS scale: Current: LBP and LE pain scale 6/10, Lt shoulder pain 7-8/10 Best: 7/10 Worst: 9/10 Pain location: low back and both thighs  Pain description: aching and throbbing Aggravating factors: bending, twisting, and lifting Relieving factors: none known   PRECAUTIONS: None  RED  FLAGS: None   WEIGHT BEARING RESTRICTIONS: No  FALLS:  Has patient fallen in last 6 months? No, but has multiple close calls.   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  DIAGNOSTIC FINDINGS: 05/27/24 lumbar CT scan  Exam results not yet released  PATIENT SURVEYS:  Modified Oswestry:  MODIFIED OSWESTRY DISABILITY SCALE  Date: 05/27/24 Score  Pain intensity 4 =  Pain medication provides me with little relief from pain.  2. Personal care (washing, dressing, etc.) 2 =  It is painful to take care of myself, and I am slow and careful.  3. Lifting 4 = I can lift only very light weights  4. Walking 3 =  Pain prevents me from walking more than  mile.  5. Sitting 3 =  Pain prevents me from sitting more than  hour.  6. Standing 4 =  Pain prevents me from standing more than 10 minutes.  7. Sleeping 3 =  Even when I take pain medication, I sleep less than 4 hours.  8. Social Life 3 =  Pain prevents me from going out very often.  9. Traveling 4 = My pain restricts my travel to short necessary journeys under 1/2 hour.  10. Employment/ Homemaking 3 = Pain prevents me from doing anything but light duties.  Total 33/50   Interpretation of scores: Score Category Description  0-20% Minimal Disability The patient can cope with most living activities. Usually no treatment is indicated apart from advice on lifting, sitting and exercise  21-40% Moderate Disability The patient experiences more pain and difficulty with sitting, lifting and standing.  Travel and social life are more difficult and they may be disabled from work. Personal care, sexual activity and sleeping are not grossly affected, and the patient can usually be managed by conservative means  41-60% Severe Disability Pain  remains the main problem in this group, but activities of daily living are affected. These patients require a detailed investigation  61-80% Crippled Back pain impinges on all aspects of the patients life. Positive intervention is required  81-100% Bed-bound These patients are either bed-bound or exaggerating their symptoms  Bluford FORBES Zoe DELENA Karon DELENA, et al. Surgery versus conservative management of stable thoracolumbar fracture: the PRESTO feasibility RCT. [place] ([place]): [employer]; 2021 Nov. [facility] Technology Assessment, No. 25.62.) Appendix 3, Oswestry Disability Index category descriptors. Available from: Findjewelers.cz  Minimally Clinically Important Difference (MCID) = 12.8%  COGNITION: Overall cognitive status: Within functional limits for tasks assessed     SENSATION: Patient reports intermittent lower extremity tingling, but none currently  POSTURE: forward head and decreased lumbar lordosis  PALPATION: Unable to assess due to significant subjective history and time constraints  LUMBAR ROM:   AROM eval  Flexion 75% limited; familiar pain   Extension 100% limited; familiar pain  Right lateral flexion   Left lateral flexion   Right rotation 90% limited; familiar pain   Left rotation 90% limited; familiar pain   (Blank rows = not tested)  LOWER EXTREMITY ROM:  WFL for activities assessed  LOWER EXTREMITY MMT:    MMT Right eval Left eval  Hip flexion 4/5; slight pain  4/5  Hip extension    Hip abduction    Hip adduction    Hip internal rotation    Hip external rotation    Knee flexion 4+/5 4+/5  Knee extension 4+/5; familiar pain  4+/5; dull pain   Ankle dorsiflexion 3+/5 3+/5   Ankle plantarflexion    Ankle inversion    Ankle eversion     (Blank rows = not tested)  LUMBAR SPECIAL TESTS:  Unable to be assessed at this time due to significant subjective history and time constraints  FUNCTIONAL TESTS:  5 times sit to stand: 06/16/24:  41.25 no UE  Timed up and go (TUG): 06/16/24: 19.98, antalgic with difficulty standing  2 minute walk test: 06/16/24: 262 feet no AD  GAIT: Assistive device utilized: None Level of assistance: Complete Independence Comments: trendelenburg, decreased gait speed and stride length   TREATMENT DATE:                                                                                                                               06/23/24: Nustep UE/LE x 5 minutes w/ MHP.  (Did not use Lt UE) [place] with SPM average STS with abdominal sets, cueing for mechanics (tendency to adduct knees when standing) 10x  Lumbar extension with //bars pressure Supine: Decompression 2-5 5x 5                                    06/18/24 EXERCISE LOG  Exercise Repetitions and Resistance Comments  Nustep  L1 x 5 minutes  Supine glute sets   20 reps w/ 5 second hold    Supine piriformis stretch  2 x 30 seconds each    Lower trunk rotation  1.5 minute    Supine ab bracing  15 reps w/ 5 second hold    Seated march  15 reps each  Alternating LE   Seated toe raises  20 reps    Seated hip ABD 15 reps each    Seated heel raise  20 reps each    Seated ADD isometric   2 minutes w/ 5 second hold    LAQ  2 minutes  Alternating LE    Blank cell = exercise not performed today   06/16/2024 Functional test measures (see above) Standing:  hip excursions 5X each, minimal movement and guarding Sit to stands no UE 5X Supine:  abdominal iso 10X5  Knee to chest with towel 5X10  LTR 10X5  Piriformis stretch 3X20 each  Bridge 10X  05/27/24: PT evaluation and education    PATIENT EDUCATION:  Education details: HEP  Person educated: Patient Education  method: Programmer, Multimedia, Facilities Manager, and Handouts Education comprehension: verbalized understanding and returned demonstration  HOME EXERCISE PROGRAM: Evaluation:  Unable to be provided at initial evaluation  Access Code: [email or web address] URL: [email or web address] Date: 06/18/2024 Prepared by: Lacinda Fass  Exercises - Hooklying Gluteal Sets  - 1 x daily - 7 x weekly - 2 sets - 10 reps - 5 seconds  hold - Supine Hip Adduction Isometric with Ball  - 1 x daily - 7 x weekly - 2 sets - 10 reps - 5 seconds hold - Seated Hip Adduction Isometrics with Ball  - 1 x daily - 7 x weekly - 2 sets - 10 reps - 5 seconds hold  Access Code: [email or web address] URL: [email or web address] Date: 06/16/2024 Prepared by: Greig Fuse Exercises - Supine Piriformis Stretch with Foot on Ground  - 2 x daily - 7 x weekly - 3 reps - 20 sec hold - Hooklying Single Knee to Chest Stretch  - 2 x daily - 7 x weekly - 5 reps - 10 sec hold - Supine Lower Trunk Rotation  - 2 x daily - 7 x weekly - 10 reps - 5 sec hold - Supine Transversus Abdominis Bracing with Pelvic Floor Contraction  - 2 x daily - 7 x weekly - 10 reps - 5 sec hold - Supine Bridge  - 2 x daily - 7 x weekly - 10 reps - Sit to Stand  - 2 x daily - 7 x weekly - 10 reps   06/23/24: -Decompression   ASSESSMENT:  CLINICAL IMPRESSION: Began session Nustep for dynamic warm up, added MHP for pain control.  Pt limited by pain in shoulders, back and radicular symptoms down Lt LE.  Educated on importance of good mechanics with bed mobility and posture play part in pain control.  Added decompression exercises for postural strengthening with positive feedback.  Trial with flexion vs extension favor, given so/so relief response with both directions.  Pt presents with guarding and limited mobility.  EOS pt stated pain reduced to 5/10.  OBJECTIVE IMPAIRMENTS: Abnormal gait, decreased activity tolerance, decreased balance, decreased mobility, difficulty  walking, decreased ROM, decreased strength, hypomobility, impaired tone, postural dysfunction, and pain.   ACTIVITY LIMITATIONS: carrying, lifting, bending, sitting, standing, squatting, sleeping, stairs, transfers, bathing, toileting, dressing, hygiene/grooming, and locomotion level  PARTICIPATION LIMITATIONS: cleaning, driving, shopping, community activity, occupation, and yard work  PERSONAL FACTORS: Past/current experiences, Time since onset of injury/illness/exacerbation, and  3+ comorbidities: OA, prior back surgery, and HTN are also affecting patient's functional outcome.   REHAB POTENTIAL: Fair    CLINICAL DECISION MAKING: Evolving/moderate complexity  EVALUATION COMPLEXITY: Moderate   GOALS: Goals reviewed with patient? No  SHORT TERM GOALS: Target date: 06/17/24  Patient will be independent with her initial HEP.  Baseline: Goal status: INITIAL  2.  Patient will report at least a 30% improvement with physical therapy.  Baseline:  Goal status: INITIAL  3.  Patient will be able to demonstrate proper lifting mechanics for improved function with her yard work.  Baseline:  Goal status: INITIAL  LONG TERM GOALS: Target date: 07/08/24  Patient will be independent with her advanced HEP.  Baseline:  Goal status: INITIAL  2.  Patient will report at least a 60% improvement with physical therapy.  Baseline:  Goal status: INITIAL  3.  Patient will be able to complete her daily activities without her familiar symptoms exceeding 6/10. Baseline:  Goal status: INITIAL  4.  Patient will improve her ODI score to 25/50 or less for improved perceived function with her daily activities.  Baseline:  Goal status: INITIAL  PLAN:  PT FREQUENCY: 2x/week  PT DURATION: 6 weeks  PLANNED INTERVENTIONS: 97164- PT Re-evaluation, 97750- Physical Performance Testing, 97110-Therapeutic exercises, 97530- Therapeutic activity, [id number]- Neuromuscular re-education, 97535- Self Care, [id number]- Manual  therapy, [phone]- Gait training, [phone]- Electrical stimulation (unattended), [phone]- Traction (mechanical), [phone] (1-2 muscles), 20561 (3+ muscles)- Dry Needling, Patient/Family education, Balance training, Stair training, Taping, Joint mobilization, Spinal mobilization, Cryotherapy, and Moist heat.  PLAN FOR NEXT SESSION: continue to progress functional mobility and strengthening.  Update HEP as needed.    Augustin Mclean, LPTA/CLT; CBIS [phone]  Mclean Augustin Amble, PTA 06/23/2024, 4:40 PM   "

## 2024-06-24 ENCOUNTER — Ambulatory Visit (HOSPITAL_COMMUNITY)

## 2024-06-24 ENCOUNTER — Encounter (HOSPITAL_COMMUNITY): Payer: Self-pay

## 2024-06-24 ENCOUNTER — Ambulatory Visit: Payer: Self-pay | Admitting: Professional Counselor

## 2024-06-24 DIAGNOSIS — M542 Cervicalgia: Secondary | ICD-10-CM

## 2024-06-24 DIAGNOSIS — M5459 Other low back pain: Secondary | ICD-10-CM | POA: Diagnosis not present

## 2024-06-24 DIAGNOSIS — Z7409 Other reduced mobility: Secondary | ICD-10-CM

## 2024-06-24 NOTE — Therapy (Signed)
 " OUTPATIENT PHYSICAL THERAPY THORACOLUMBAR TREATMENT   Patient Name: Jacqueline [name] MRN: [id number] DOB:10/27/1960, 64 y.o., female Today's Date: 06/24/2024  END OF SESSION:  PT End of Session - 06/24/24 1125     Visit Number 5    Number of Visits 12    Date for Recertification  08/06/24    Authorization Type Medicaid [employer]    Authorization Time Period 8 visits 05/27/2024-07/09/2024    Authorization - Number of Visits 8    Progress Note Due on Visit 8    PT Start Time 1120    PT Stop Time 1159    PT Time Calculation (min) 39 min    Activity Tolerance Patient limited by pain    Behavior During Therapy [facility] for tasks assessed/performed            Past Medical History:  Diagnosis Date   Anemia    Arthritis    per patient, back   Complication of anesthesia 2018   per patient HR dropped during partial hysterectomy surgery   Fibroid uterus    GERD (gastroesophageal reflux disease)    H/O total hysterectomy    Heart murmur    Hyperlipidemia    Borderline   Hypertension    Numbness and tingling in right hand    Pre-diabetes    borderline   Sleep apnea    Past Surgical History:  Procedure Laterality Date   BACK SURGERY     BALLOON DILATION N/A 05/22/2021   Procedure: BALLOON DILATION;  Surgeon: Cindie Carlin POUR, DO;  [facility];  Service: Endoscopy;  Laterality: N/A;   BIOPSY  11/18/2022   Procedure: BIOPSY;  Surgeon: Shaaron Lamar HERO, MD;  [facility];  Service: Endoscopy;;   CATARACT EXTRACTION W/PHACO Left 06/27/2023   Procedure: CATARACT EXTRACTION PHACO AND INTRAOCULAR LENS PLACEMENT (IOC);  Surgeon: Harrie Agent, MD;  Location: [facility];  Service: Ophthalmology;  Laterality: Left;  CDE: 3.56   CATARACT EXTRACTION W/PHACO Right 07/11/2023   Procedure: CATARACT EXTRACTION PHACO AND INTRAOCULAR LENS PLACEMENT (IOC);  Surgeon: Harrie Agent, MD;  Location: [facility];  Service: Ophthalmology;  Laterality: Right;  CDE: 2.82   CHOLECYSTECTOMY      COLONOSCOPY  2003   Dr. Shaaron: Anal canal hemorrhoids   COLONOSCOPY N/A 08/25/2014   Normal colonoscopy.  Next colonoscopy in 2026.  Procedure: COLONOSCOPY;  Surgeon: Lamar HERO Shaaron, MD;  [facility];  Service: Endoscopy;  Laterality: N/A;  1200   ESOPHAGOGASTRODUODENOSCOPY N/A 08/25/2014   Procedure: ESOPHAGOGASTRODUODENOSCOPY (EGD);  Surgeon: Lamar HERO Shaaron, MD;  [facility];  Service: Endoscopy;  Laterality: N/A;   ESOPHAGOGASTRODUODENOSCOPY (EGD) WITH PROPOFOL  N/A 05/22/2021   Web and distal esophagus, dilated, gastriti, biopsy positive for mildly active H. pylori gastritis.  s procedure: ESOPHAGOGASTRODUODENOSCOPY (EGD) WITH PROPOFOL ;  Surgeon: Cindie Carlin POUR, DO;  [facility];  Service: Endoscopy;  Laterality: N/A;  9:15am   ESOPHAGOGASTRODUODENOSCOPY (EGD) WITH PROPOFOL  N/A 11/18/2022   Procedure: ESOPHAGOGASTRODUODENOSCOPY (EGD) WITH PROPOFOL ;  Surgeon: Shaaron Lamar HERO, MD;  [facility];  Service: Endoscopy;  Laterality: N/A;  2:15 pm, asa 2   KNEE ARTHROSCOPY WITH MEDIAL MENISECTOMY Left 12/25/2022   Procedure: KNEE ARTHROSCOPY WITH PARTIAL LATERAL MENISCECTOMY DEBRIDEMENT ACL;  Surgeon: Margrette Taft BRAVO, MD;  Location: [facility];  Service: Orthopedics;  Laterality: Left;   MALONEY DILATION N/A 11/18/2022   Procedure: AGAPITO DILATION;  Surgeon: Shaaron Lamar HERO, MD;  [facility];  Service: Endoscopy;  Laterality: N/A;   SALPINGOOPHORECTOMY Bilateral 11/06/2016   Procedure: BILATERAL SALPINGO OOPHORECTOMY;  Surgeon: Jayne Vonn DEL, MD;  Location: [facility];  Service: Gynecology;  Laterality: Bilateral;   SUPRACERVICAL ABDOMINAL HYSTERECTOMY N/A 11/06/2016   Procedure: HYSTERECTOMY SUPRACERVICAL ABDOMINAL;  Surgeon: Jayne Vonn DEL, MD;  Location: [facility];  Service: Gynecology;  Laterality: N/A;   TRANSFORAMINAL LUMBAR INTERBODY FUSION (TLIF) WITH PEDICLE SCREW FIXATION 2 LEVEL Left 07/28/2019   Procedure: LEFT-SIDED LUMBAR 4- 5, LUMBAR  5 -SACRUM1 TRANSFORAMINAL LUMBAR INTERBODY FUSION WITH INSTRUMENTATION AND ALLOGRAFT;  Surgeon: Beuford Anes, MD;  Location: [facility];  Service: Orthopedics;  Laterality: Left;   TUBAL LIGATION     Patient Active Problem List   Diagnosis Date Noted   Lumbosacral radiculopathy 01/21/2024   Vaginal irritation 11/29/2023   Situational anxiety 11/29/2023   Elevated LFTs 08/26/2023   Viral illness 08/06/2023   Epigastric pain 07/07/2023   S/P arthroscopy of left knee debridement of ACL/ lateral meniscectomy 12/25/22 01/02/2023   Bucket-handle tear of lateral meniscus of left knee as current injury 12/25/2022   Left anterior cruciate ligament tear 12/25/2022   Acute non-recurrent maxillary sinusitis 10/26/2022   Mucopurulent conjunctivitis of both eyes 10/26/2022   Acute bronchitis 10/25/2022   Maxillary sinusitis, acute 10/25/2022   Conjunctivitis 10/25/2022   Neck pain, chronic 08/28/2022   Sleep apnea 08/28/2022   Need for immunization against influenza 02/28/2022   Screening for cervical cancer 12/21/2021   H. pylori infection 12/05/2021   Snoring 11/23/2021   Insomnia 11/23/2021   Annual physical exam 10/16/2021   Dermatitis 10/16/2021   Need for varicella vaccine 10/16/2021   Diarrhea 07/05/2021   Stomach cramps 07/05/2021   Nausea & vomiting 07/05/2021   Encounter for vaccination 07/05/2021   Right foot pain 05/16/2021   Dysphagia 05/01/2021   Right shoulder pain 12/27/2020   Rash and nonspecific skin eruption 12/13/2020   Chronic back pain 04/04/2020   History of back surgery 04/04/2020   Radiculopathy 07/28/2019   Borderline diabetes 12/25/2018   Hypertension 06/30/2018   S/P abdominal supracervical subtotal hysterectomy 11/06/2016   Trigger middle finger of right hand 04/24/2016   GERD (gastroesophageal reflux disease) 08/04/2014   Shoulder strain 07/26/2014   Degenerative arthritis of thumb 07/26/2014   Left knee pain 02/28/2014   Hip pain 04/15/2013    Epicondylitis, lateral (tennis elbow) 04/13/2013   Constipation 09/15/2012   Hyperlipidemia 11/28/2011   Obesity, Class I, BMI 30.0-34.9 (see actual BMI) 09/15/2011   ALLERGIC RHINITIS, SEASONAL 11/13/2007    PCP: Edman Meade PEDLAR, FNP   REFERRING PROVIDER: Beuford Anes, MD   REFERRING DIAG: neck and low back pain   Rationale for Evaluation and Treatment: Rehabilitation  THERAPY DIAG:  Other low back pain  Neck pain  Impaired functional mobility, balance, gait, and endurance  ONSET DATE: 2017  SUBJECTIVE:  SUBJECTIVE STATEMENT: Pt reports 8/10 pain in multiple locations: low back, buttocks, LE with tingling down LLE, neck and RUE. Reports she tripped over ottoman last night but the couch caught her. Reports she has doctor visit to discuss her MRI findings tomorrow, 06/25/24.    Evaluation:  Patient reports that her back has been hurting since she fell backward in 2017 and then she had surgery on her back in 2021. She is having pain in her back radiating down both legs into both thighs. She feels that her pain has been getting worse recently. She had a CT on her spine yesterday. She has tried injections and epidurals, but these have not helped. She has a back brace, but she does not wear this much due to it being big and bulky. She also has neck and arm pain.   PERTINENT HISTORY:  OA, prior back surgery, and HTN  PAIN:  Are you having pain? Yes: NPRS scale: Current: LBP and LE pain scale 6/10, Lt shoulder pain 7-8/10 Best: 7/10 Worst: 9/10 Pain location: low back and both thighs  Pain description: aching and throbbing Aggravating factors: bending, twisting, and lifting Relieving factors: none known   PRECAUTIONS: None  RED FLAGS: None   WEIGHT BEARING RESTRICTIONS: No  FALLS:  Has  patient fallen in last 6 months? No, but has multiple close calls.   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  DIAGNOSTIC FINDINGS: 05/27/24 lumbar CT scan  Exam results not yet released  PATIENT SURVEYS:  Modified Oswestry:  MODIFIED OSWESTRY DISABILITY SCALE  Date: 05/27/24 Score  Pain intensity 4 =  Pain medication provides me with little relief from pain.  2. Personal care (washing, dressing, etc.) 2 =  It is painful to take care of myself, and I am slow and careful.  3. Lifting 4 = I can lift only very light weights  4. Walking 3 =  Pain prevents me from walking more than  mile.  5. Sitting 3 =  Pain prevents me from sitting more than  hour.  6. Standing 4 =  Pain prevents me from standing more than 10 minutes.  7. Sleeping 3 =  Even when I take pain medication, I sleep less than 4 hours.  8. Social Life 3 =  Pain prevents me from going out very often.  9. Traveling 4 = My pain restricts my travel to short necessary journeys under 1/2 hour.  10. Employment/ Homemaking 3 = Pain prevents me from doing anything but light duties.  Total 33/50   Interpretation of scores: Score Category Description  0-20% Minimal Disability The patient can cope with most living activities. Usually no treatment is indicated apart from advice on lifting, sitting and exercise  21-40% Moderate Disability The patient experiences more pain and difficulty with sitting, lifting and standing. Travel and social life are more difficult and they may be disabled  from work. Personal care, sexual activity and sleeping are not grossly affected, and the patient can usually be managed by conservative means  41-60% Severe Disability Pain remains  the main problem in this group, but activities of daily living are affected. These patients require a detailed investigation  61-80% Crippled Back pain impinges on all aspects of the patients life. Positive intervention is required  81-100% Bed-bound These patients are either bed-bound or exaggerating their symptoms  Bluford FORBES Zoe DELENA Karon DELENA, et al. Surgery versus conservative management of stable thoracolumbar fracture: the PRESTO feasibility RCT. [place] ([place]): [employer]; 2021 Nov. [facility] Technology Assessment, No. 25.62.) Appendix 3, Oswestry Disability Index category descriptors. Available from: Findjewelers.cz  Minimally Clinically Important Difference (MCID) = 12.8%  COGNITION: Overall cognitive status: Within functional limits for tasks assessed     SENSATION: Patient reports intermittent lower extremity tingling, but none currently  POSTURE: forward head and decreased lumbar lordosis  PALPATION: Unable to assess due to significant subjective history and time constraints  LUMBAR ROM:   AROM eval  Flexion 75% limited; familiar pain   Extension 100% limited; familiar pain  Right lateral flexion   Left lateral flexion   Right rotation 90% limited; familiar pain   Left rotation 90% limited; familiar pain   (Blank rows = not tested)  LOWER EXTREMITY ROM:  WFL for activities assessed  LOWER EXTREMITY MMT:    MMT Right eval Left eval  Hip flexion 4/5; slight pain  4/5  Hip extension    Hip abduction    Hip adduction    Hip internal rotation    Hip external rotation    Knee flexion 4+/5 4+/5  Knee extension 4+/5; familiar pain  4+/5; dull pain   Ankle dorsiflexion 3+/5 3+/5  Ankle plantarflexion    Ankle inversion    Ankle eversion      (Blank rows = not tested)  LUMBAR SPECIAL TESTS:  Unable to be assessed at this time due to significant subjective history and time constraints  FUNCTIONAL TESTS:  5 times sit to stand: 06/16/24:  41.25 no UE  Timed up and go (TUG): 06/16/24: 19.98, antalgic with difficulty standing  2 minute walk test: 06/16/24: 262 feet no AD  GAIT: Assistive device utilized: None Level of assistance: Complete Independence Comments: trendelenburg, decreased gait speed and stride length   TREATMENT DATE:         06/24/24: MHP donned LTR, 2' SKTC with towel, 2x10 on each LE Decompression exercises 2-4, 5 holds, 10 x of each  Added overhead reach with bar, + TA contraction, 2x10  Added shoulder ER/IR, RTB, 2x10                                                                                                                          06/23/24: Nustep UE/LE x 5 minutes w/ MHP.  (Did not use Lt UE) [place] with SPM average STS with abdominal sets, cueing for mechanics (tendency to adduct knees when standing) 10x  Lumbar extension with //bars pressure Supine: Decompression 2-5 5x 5  06/18/24 EXERCISE LOG  Exercise Repetitions and Resistance Comments  Nustep  L1 x 5 minutes    Supine glute sets   20 reps w/ 5 second hold    Supine piriformis stretch  2 x 30 seconds each    Lower trunk rotation  1.5 minute    Supine ab bracing  15 reps w/ 5 second hold    Seated march  15 reps each  Alternating LE   Seated toe raises  20 reps    Seated hip ABD 15 reps each    Seated heel raise  20 reps each    Seated ADD isometric   2 minutes w/ 5 second hold    LAQ  2 minutes  Alternating LE    Blank cell = exercise not performed today     PATIENT EDUCATION:  Education details: HEP  Person educated: Patient Education method: Programmer, Multimedia, Demonstration, and Handouts Education comprehension: verbalized understanding and returned demonstration  HOME EXERCISE  PROGRAM: Evaluation:  Unable to be provided at initial evaluation  Access Code: [email or web address] URL: [email or web address] Date: 06/18/2024 Prepared by: Lacinda Fass  Exercises - Hooklying Gluteal Sets  - 1 x daily - 7 x weekly - 2 sets - 10 reps - 5 seconds  hold - Supine Hip Adduction Isometric with Ball  - 1 x daily - 7 x weekly - 2 sets - 10 reps - 5 seconds hold - Seated Hip Adduction Isometrics with Ball  - 1 x daily - 7 x weekly - 2 sets - 10 reps - 5 seconds hold  Access Code: [email or web address] URL: [email or web address] Date: 06/16/2024 Prepared by: Greig Fuse Exercises - Supine Piriformis Stretch with Foot on Ground  - 2 x daily - 7 x weekly - 3 reps - 20 sec hold - Hooklying Single Knee to Chest Stretch  - 2 x daily - 7 x weekly - 5 reps - 10 sec hold - Supine Lower Trunk Rotation  - 2 x daily - 7 x weekly - 10 reps - 5 sec hold - Supine Transversus Abdominis Bracing with Pelvic Floor Contraction  - 2 x daily - 7 x weekly - 10 reps - 5 sec hold - Supine Bridge  - 2 x daily - 7 x weekly - 10 reps - Sit to Stand  - 2 x daily - 7 x weekly - 10 reps   06/23/24: -Decompression + theraband exercises 1 and 4   ASSESSMENT:  CLINICAL IMPRESSION: Patient arrives to session in increased pain all over. Limited session to tolerable activities for pain control as well as donned moist heat to low back during supine activities. Pt reports inc discomfort in shoulders with SKTC. Limited motion with improved pain. Followed with reviewing decompression exercises for spine. Pt tolerates very well. Added in UE movements for added challenge. Slight pain with IR/ER of L shoulder with theraband. PT continues to demonstrate inc pain and antalgic gait at end of session. Patient will benefit from continued skilled physical therapy in order to address current deficits to improve pain and overall function.      OBJECTIVE IMPAIRMENTS: Abnormal gait, decreased activity tolerance,  decreased balance, decreased mobility, difficulty walking, decreased ROM, decreased strength, hypomobility, impaired tone, postural dysfunction, and pain.   ACTIVITY LIMITATIONS: carrying, lifting, bending, sitting, standing, squatting, sleeping, stairs, transfers, bathing, toileting, dressing, hygiene/grooming, and locomotion level  PARTICIPATION LIMITATIONS: cleaning, driving, shopping, community activity, occupation, and yard work  PERSONAL FACTORS: Past/current experiences, Time since onset of injury/illness/exacerbation, and 3+ comorbidities: OA, prior  back surgery, and HTN are also affecting patient's functional outcome.   REHAB POTENTIAL: Fair    CLINICAL DECISION MAKING: Evolving/moderate complexity  EVALUATION COMPLEXITY: Moderate   GOALS: Goals reviewed with patient? No  SHORT TERM GOALS: Target date: 06/17/24  Patient will be independent with her initial HEP.  Baseline: Goal status: INITIAL  2.  Patient will report at least a 30% improvement with physical therapy.  Baseline:  Goal status: INITIAL  3.  Patient will be able to demonstrate proper lifting mechanics for improved function with her yard work.  Baseline:  Goal status: INITIAL  LONG TERM GOALS: Target date: 07/08/24  Patient will be independent with her advanced HEP.  Baseline:  Goal status: INITIAL  2.  Patient will report at least a 60% improvement with physical therapy.  Baseline:  Goal status: INITIAL  3.  Patient will be able to complete her daily activities without her familiar symptoms exceeding 6/10. Baseline:  Goal status: INITIAL  4.  Patient will improve her ODI score to 25/50 or less for improved perceived function with her daily activities.  Baseline:  Goal status: INITIAL  PLAN:  PT FREQUENCY: 2x/week  PT DURATION: 6 weeks  PLANNED INTERVENTIONS: 97164- PT Re-evaluation, 97750- Physical Performance Testing, 97110-Therapeutic exercises, 97530- Therapeutic activity, [id number]-  Neuromuscular re-education, 97535- Self Care, 02859- Manual therapy, [phone]- Gait training, [phone]- Electrical stimulation (unattended), [phone]- Traction (mechanical), [phone] (1-2 muscles), [phone] (3+ muscles)- Dry Needling, Patient/Family education, Balance training, Stair training, Taping, Joint mobilization, Spinal mobilization, Cryotherapy, and Moist heat.  PLAN FOR NEXT SESSION: continue to progress functional mobility and strengthening.  Update HEP as needed.     Rajesh Wyss E Powell-Butler, PT 06/24/2024, 12:10 PM   "

## 2024-06-25 ENCOUNTER — Ambulatory Visit (HOSPITAL_COMMUNITY)

## 2024-06-29 ENCOUNTER — Ambulatory Visit (HOSPITAL_COMMUNITY): Payer: Self-pay | Admitting: Physical Therapy

## 2024-06-29 ENCOUNTER — Encounter (HOSPITAL_COMMUNITY): Payer: Self-pay

## 2024-07-01 ENCOUNTER — Ambulatory Visit (HOSPITAL_COMMUNITY): Payer: Self-pay

## 2024-07-05 ENCOUNTER — Ambulatory Visit: Payer: Self-pay

## 2024-07-05 NOTE — Telephone Encounter (Signed)
 FYI Only or Action Required?: FYI only for provider: appointment scheduled on 07/07/24. Requesting follow up from [place] academic librarian).  Patient was last seen in primary care on 05/31/2024 by Edman Meade PEDLAR, FNP.  Called Nurse Triage reporting Shoulder Pain.  Symptoms began several weeks ago.  Interventions attempted: Prescription medications: pregabalin, robaxin .  Symptoms are: gradually worsening.  Triage Disposition: See PCP When Office is Open (Within 3 Days)  Patient/caregiver understands and will follow disposition?:  Yes                   Message from [facility] P sent at 07/05/2024  9:36 AM EST  Reason for Triage: Left shoulder. nerve, Hurting - paining and what not  [facility] - advised pt she has nerve damage.   Reason for Disposition  [1] MODERATE pain (e.g., interferes with normal activities) AND [2] present > 3 days  Answer Assessment - Initial Assessment Questions 1. ONSET: When did the pain start?     X 3-4 weeks. Gradually worsening.  2. LOCATION: Where is the pain located?     Sharp pain left shoulder (side of shoulder and front of collarbone). Radiates down to left elbow and hand. Right shoulder pain and elbow as well.  3. PAIN: How bad is the pain? (Scale 1-10; or mild, moderate, severe)     7/10  4. WORK OR EXERCISE: Has there been any recent work or exercise that involved this part of the body?     No.  5. CAUSE: What do you think is causing the shoulder pain?     Was receiving injections in the back/shoulders previously. 2 nerves deteriorating or 2 discs or something in neck. Nerve damage  6. OTHER SYMPTOMS: Do you have any other symptoms? (e.g., neck pain, swelling, rash, fever, numbness, weakness)     Neck and back pain, dizzy spell lightheaded episode with vomiting  a couple weeks ago when she tried a new pain patch. No chest pain, difficulty breathing,  dark or red colored urine, fever, swelling or  bruising in left arm/shoulder, dizziness or vomiting at present.  Protocols used: Shoulder Pain-A-AH

## 2024-07-06 ENCOUNTER — Ambulatory Visit (HOSPITAL_COMMUNITY): Payer: Self-pay

## 2024-07-07 ENCOUNTER — Ambulatory Visit: Payer: Self-pay

## 2024-07-07 ENCOUNTER — Ambulatory Visit: Admitting: Nurse Practitioner

## 2024-07-08 ENCOUNTER — Telehealth: Payer: Self-pay | Admitting: Family Medicine

## 2024-07-08 ENCOUNTER — Ambulatory Visit (HOSPITAL_COMMUNITY): Payer: Self-pay

## 2024-07-08 DIAGNOSIS — M25511 Pain in right shoulder: Secondary | ICD-10-CM

## 2024-07-08 DIAGNOSIS — M25512 Pain in left shoulder: Secondary | ICD-10-CM | POA: Diagnosis not present

## 2024-07-08 DIAGNOSIS — G8929 Other chronic pain: Secondary | ICD-10-CM

## 2024-07-08 NOTE — Progress Notes (Signed)
 "  Virtual Visit via Video Note  I connected with Jacqueline [name] on 07/09/24 at  3:20 PM EST by a video enabled telemedicine application and verified that I am speaking with the correct person using two identifiers.  Patient Location: Home Provider Location: Home Office  I discussed the limitations, risks, security, and privacy concerns of performing an evaluation and management service by video and the availability of in person appointments. I also discussed with the patient that there may be a patient responsible charge related to this service. The patient expressed understanding and agreed to proceed.  Subjective: PCP: Edman Meade PEDLAR, FNP  Chief Complaint  Patient presents with   Shoulder Pain    Shoulder pain in both shoulders radiating down into the arms and making the fingers tingle    HPI  The patient presents today with chronic shoulder pain. She is currently taking Robaxin  and Lyrica for symptom management and is under the care of [facility] as well as orthopedic specialists. She reports a follow-up appointment with orthopedics scheduled for 07/09/2024  ROS: Per HPI Current Medications[1]  Observations/Objective: There were no vitals filed for this visit. Physical Exam Patient is well-developed, well-nourished in no acute distress.  Resting comfortably at home.  Head is normocephalic, atraumatic.  No labored breathing.  Speech is clear and coherent with logical content.  Patient is alert and oriented at baseline.   Assessment and Plan: Chronic shoulder pain, unspecified laterality Assessment & Plan: Continue Robaxin  and Lyrica as prescribed for pain management. Follow up with orthopedics on 07/09/2024 for further evaluation and management. Continue coordination with [facility] for ongoing pain management strategies. Encourage activity modifications and physical therapy as recommended by specialists. Monitor for worsening pain, decreased range of  motion, or new symptoms, and report promptly. Reinforce adherence to prescribed medications and safety precautions for side effects.    Note: This chart has been completed using [employer] (consulting) software, and while attempts have been made to ensure accuracy, certain words and phrases may not be transcribed as intended.    Follow Up Instructions: No follow-ups on file.   I discussed the assessment and treatment plan with the patient. The patient was provided an opportunity to ask questions, and all were answered. The patient agreed with the plan and demonstrated an understanding of the instructions.   The patient was advised to call back or seek an in-person evaluation if the symptoms worsen or if the condition fails to improve as anticipated.  The above assessment and management plan was discussed with the patient. The patient verbalized understanding of and has agreed to the management plan.   Jacqueline Starry  Z Bacchus, FNP     [1]  Current Outpatient Medications:    acetaminophen -codeine (TYLENOL  #3) 300-30 MG tablet, Take 1 tablet by mouth every 12 (twelve) hours as needed., Disp: , Rfl:    acyclovir  (ZOVIRAX ) 400 MG tablet, TAKE 1 TABLET (400 MG TOTAL) BY MOUTH DAILY AS NEEDED (OUTBREAKS), Disp: 30 tablet, Rfl: 5   albuterol  (VENTOLIN  HFA) 108 (90 Base) MCG/ACT inhaler, Inhale 2 puffs into the lungs every 6 (six) hours as needed for wheezing or shortness of breath., Disp: 8 g, Rfl: 2   amLODipine  (NORVASC ) 5 MG tablet, Take 1 tablet (5 mg total) by mouth daily., Disp: 90 tablet, Rfl: 3   Azelastine  HCl 137 MCG/SPRAY SOLN, USE 1 SPRAY(S) IN EACH NOSTRIL TWICE DAILY AS DIRECTED, Disp: 30 mL, Rfl: 0   chlorhexidine  (HIBICLENS ) 4 %  external liquid, Apply topically daily as needed., Disp: 236 mL, Rfl: 0   clotrimazole -betamethasone  (LOTRISONE ) cream, Apply 1 Application topically daily., Disp: 15 g, Rfl: 2   conjugated estrogens  (PREMARIN ) vaginal cream, Place 1 Applicatorful vaginally  daily. Use 1 gram nightly, Disp: 30 g, Rfl: 12   cyclobenzaprine  (FLEXERIL ) 5 MG tablet, , Disp: , Rfl:    diazepam  (VALIUM ) 10 MG tablet, Take 1 tablet (10 mg total) by mouth every 12 (twelve) hours as needed for anxiety (take 30 minutes prior to procedure.)., Disp: 4 tablet, Rfl: 0   diclofenac  (VOLTAREN ) 75 MG EC tablet, TAKE 1 TABLET BY MOUTH TWICE DAILY WITH A MEAL, Disp: 60 tablet, Rfl: 0   diclofenac  Sodium (VOLTAREN ) 1 % GEL, Apply 4 g topically 4 (four) times daily. To knee, Disp: 500 g, Rfl: 1   DULoxetine  (CYMBALTA ) 30 MG capsule, Take 1 capsule (30 mg total) by mouth daily., Disp: 90 capsule, Rfl: 3   estradiol  (ESTRACE ) 0.1 MG/GM vaginal cream, INSERT 2 GRAMS VAGINALLY AS DIRECTED EVERY OTHER NIGHT, Disp: 43 g, Rfl: 5   ezetimibe  (ZETIA ) 10 MG tablet, Take 1 tablet by mouth once daily, Disp: 90 tablet, Rfl: 0   HYDROcodone -acetaminophen  (NORCO) 7.5-325 MG tablet, TAKE 1 TABLET BY MOUTH EVERY 8 HOURS AS NEEDED FOR SEVERE PAIN, Disp: , Rfl:    hydrocortisone  (ANUSOL -HC) 2.5 % rectal cream, Place 1 Application rectally 2 (two) times daily. For hemorrhoids, Disp: 30 g, Rfl: 1   ibuprofen  (ADVIL ) 800 MG tablet, Take 1 tablet (800 mg total) by mouth every 8 (eight) hours as needed., Disp: 90 tablet, Rfl: 1   levocetirizine (XYZAL ) 5 MG tablet, TAKE 1 TABLET BY MOUTH ONCE DAILY IN THE EVENING, Disp: 30 tablet, Rfl: 0   loratadine (CLARITIN) 10 MG tablet, , Disp: , Rfl:    methocarbamol  (ROBAXIN ) 750 MG tablet, Take 1 tablet (750 mg total) by mouth every 8 (eight) hours as needed for muscle spasms (pain)., Disp: 90 tablet, Rfl: 2   Multiple Vitamin (MULTIVITAMIN WITH MINERALS) TABS tablet, Take 1 tablet by mouth daily with supper. One-A-Day Multivitamin, Disp: , Rfl:    mupirocin  ointment (BACTROBAN ) 2 %, Apply 1 Application topically 2 (two) times daily., Disp: 60 g, Rfl: 0   oxyCODONE -acetaminophen  (PERCOCET/ROXICET) 5-325 MG tablet, TAKE 1 TABLET BY MOUTH EVERY 8 HOURS AS NEEDED FOR MODERATE  TO SEVERE PAIN FOR 5 DAYS, Disp: , Rfl:    pantoprazole  (PROTONIX ) 40 MG tablet, TAKE 1 TABLET BY MOUTH TWICE DAILY BEFORE A MEAL, Disp: 60 tablet, Rfl: 2   pregabalin (LYRICA) 50 MG capsule, TAKE 1 CAPSULE BY MOUTH NIGHTLY FOR 3 DAYS, THEN INCREASE TO TWICE A DAY FOR 3 DAYS, THEN INCREASE TO 1 CAPSULE BY MOUTH THREE TIMES A DAY, Disp: , Rfl:    traMADol  (ULTRAM ) 50 MG tablet, TAKE 1 TABLET BY MOUTH EVERY 6 TO 8 HOURS AS NEEDED FOR MODERATE TO SEVERE PAIN FOR 7 DAYS, Disp: , Rfl:    traZODone  (DESYREL ) 50 MG tablet, Take 1 tablet (50 mg total) by mouth at bedtime as needed for sleep., Disp: 90 tablet, Rfl: 1  "

## 2024-07-09 NOTE — Assessment & Plan Note (Addendum)
 Continue Robaxin  and Lyrica as prescribed for pain management. Follow up with orthopedics on 07/09/2024 for further evaluation and management. Continue coordination with [facility] for ongoing pain management strategies. Encourage activity modifications and physical therapy as recommended by specialists. Monitor for worsening pain, decreased range of motion, or new symptoms, and report promptly. Reinforce adherence to prescribed medications and safety precautions for side effects.

## 2024-07-12 ENCOUNTER — Telehealth: Payer: Self-pay | Admitting: Family Medicine

## 2024-07-13 ENCOUNTER — Ambulatory Visit (HOSPITAL_COMMUNITY): Payer: Self-pay

## 2024-07-15 ENCOUNTER — Telehealth: Payer: Self-pay | Admitting: Family Medicine

## 2024-07-15 ENCOUNTER — Other Ambulatory Visit: Payer: Self-pay | Admitting: Orthopedic Surgery

## 2024-07-15 ENCOUNTER — Ambulatory Visit: Payer: Self-pay | Admitting: Professional Counselor

## 2024-07-15 ENCOUNTER — Ambulatory Visit (HOSPITAL_COMMUNITY)

## 2024-07-15 NOTE — Telephone Encounter (Signed)
 Copied from CRM [id number]. Topic: Appointments - Scheduling Inquiry for Clinic >> Jul 15, 2024 12:36 PM Willma R wrote: Reason for CRM: Patient was scheduled for a video visit with Redell Corn at 11:30. States has been waiting for him to call her since when she first went into the video visit it said he was running late. Assumed he would call. Patient is unsure if she can still be seen today, if not needs to reschedule.  Patient can be reached at [phone]

## 2024-07-16 ENCOUNTER — Encounter (INDEPENDENT_AMBULATORY_CARE_PROVIDER_SITE_OTHER): Payer: Self-pay | Admitting: *Deleted

## 2024-07-20 ENCOUNTER — Ambulatory Visit (HOSPITAL_COMMUNITY): Admitting: Physical Therapy

## 2024-07-22 ENCOUNTER — Ambulatory Visit (HOSPITAL_COMMUNITY)

## 2024-07-28 ENCOUNTER — Inpatient Hospital Stay (HOSPITAL_COMMUNITY): Admission: RE | Admit: 2024-07-28 | Admitting: Orthopedic Surgery

## 2024-07-28 ENCOUNTER — Encounter: Admission: RE | Payer: Self-pay

## 2024-07-29 ENCOUNTER — Inpatient Hospital Stay (HOSPITAL_COMMUNITY): Admission: RE | Admit: 2024-07-29 | Admitting: Orthopedic Surgery

## 2024-07-29 ENCOUNTER — Encounter: Admission: RE | Payer: Self-pay

## 2024-08-18 ENCOUNTER — Ambulatory Visit: Payer: Self-pay | Admitting: Nurse Practitioner

## 2024-12-06 ENCOUNTER — Ambulatory Visit: Payer: Self-pay
